# Patient Record
Sex: Female | Born: 1960 | Race: Black or African American | Hispanic: No | Marital: Single | Smoking: Current some day smoker
Health system: Southern US, Community
[De-identification: ages and names within clinical notes are randomized; demographics above are authoritative.]

## PROBLEM LIST (undated history)

## (undated) DIAGNOSIS — F200 Paranoid schizophrenia: Secondary | ICD-10-CM

## (undated) DIAGNOSIS — N185 Chronic kidney disease, stage 5: Secondary | ICD-10-CM

## (undated) DIAGNOSIS — D649 Anemia, unspecified: Secondary | ICD-10-CM

## (undated) DIAGNOSIS — D3502 Benign neoplasm of left adrenal gland: Secondary | ICD-10-CM

## (undated) DIAGNOSIS — I700 Atherosclerosis of aorta: Secondary | ICD-10-CM

## (undated) DIAGNOSIS — E119 Type 2 diabetes mellitus without complications: Secondary | ICD-10-CM

## (undated) DIAGNOSIS — F329 Major depressive disorder, single episode, unspecified: Secondary | ICD-10-CM

## (undated) DIAGNOSIS — R809 Proteinuria, unspecified: Secondary | ICD-10-CM

## (undated) DIAGNOSIS — K5790 Diverticulosis of intestine, part unspecified, without perforation or abscess without bleeding: Secondary | ICD-10-CM

## (undated) DIAGNOSIS — F209 Schizophrenia, unspecified: Secondary | ICD-10-CM

## (undated) DIAGNOSIS — I10 Essential (primary) hypertension: Secondary | ICD-10-CM

## (undated) DIAGNOSIS — F32A Depression, unspecified: Secondary | ICD-10-CM

## (undated) DIAGNOSIS — D3501 Benign neoplasm of right adrenal gland: Secondary | ICD-10-CM

## (undated) HISTORY — DX: Depression, unspecified: F32.A

## (undated) HISTORY — PX: OTHER SURGICAL HISTORY: SHX169

## (undated) HISTORY — DX: Type 2 diabetes mellitus without complications: E11.9

## (undated) HISTORY — DX: Schizophrenia, unspecified: F20.9

## (undated) HISTORY — DX: Major depressive disorder, single episode, unspecified: F32.9

---

## 2004-11-11 ENCOUNTER — Emergency Department: Payer: Self-pay | Admitting: Emergency Medicine

## 2005-09-21 ENCOUNTER — Ambulatory Visit: Payer: Self-pay | Admitting: Gastroenterology

## 2005-10-04 ENCOUNTER — Ambulatory Visit: Payer: Self-pay | Admitting: Gastroenterology

## 2005-11-11 ENCOUNTER — Ambulatory Visit: Payer: Self-pay | Admitting: Gastroenterology

## 2006-05-20 ENCOUNTER — Ambulatory Visit: Payer: Self-pay | Admitting: *Deleted

## 2007-05-19 ENCOUNTER — Ambulatory Visit: Payer: Self-pay | Admitting: *Deleted

## 2007-07-20 ENCOUNTER — Ambulatory Visit: Payer: Self-pay | Admitting: *Deleted

## 2008-08-20 ENCOUNTER — Emergency Department: Payer: Self-pay | Admitting: Emergency Medicine

## 2010-08-01 ENCOUNTER — Emergency Department: Payer: Self-pay | Admitting: Emergency Medicine

## 2010-11-18 ENCOUNTER — Emergency Department: Payer: Self-pay | Admitting: Emergency Medicine

## 2010-11-25 ENCOUNTER — Inpatient Hospital Stay: Payer: Self-pay | Admitting: Internal Medicine

## 2011-02-22 ENCOUNTER — Ambulatory Visit: Payer: Self-pay | Admitting: Family Medicine

## 2012-07-17 DIAGNOSIS — K644 Residual hemorrhoidal skin tags: Secondary | ICD-10-CM | POA: Insufficient documentation

## 2012-08-24 ENCOUNTER — Ambulatory Visit: Payer: Self-pay | Admitting: Emergency Medicine

## 2012-08-29 ENCOUNTER — Ambulatory Visit: Payer: Self-pay | Admitting: Emergency Medicine

## 2012-08-30 LAB — PATHOLOGY REPORT

## 2014-02-18 DIAGNOSIS — R600 Localized edema: Secondary | ICD-10-CM | POA: Insufficient documentation

## 2014-02-20 ENCOUNTER — Ambulatory Visit: Payer: Self-pay | Admitting: Family Medicine

## 2014-02-20 DIAGNOSIS — M109 Gout, unspecified: Secondary | ICD-10-CM | POA: Insufficient documentation

## 2014-03-05 DIAGNOSIS — L237 Allergic contact dermatitis due to plants, except food: Secondary | ICD-10-CM | POA: Insufficient documentation

## 2014-04-23 ENCOUNTER — Emergency Department: Payer: Self-pay | Admitting: Emergency Medicine

## 2014-04-23 LAB — BASIC METABOLIC PANEL
Anion Gap: 7 (ref 7–16)
BUN: 5 mg/dL — AB (ref 7–18)
Calcium, Total: 9.4 mg/dL (ref 8.5–10.1)
Chloride: 105 mmol/L (ref 98–107)
Co2: 30 mmol/L (ref 21–32)
Creatinine: 0.89 mg/dL (ref 0.60–1.30)
EGFR (Non-African Amer.): >60
GLUCOSE: 202 mg/dL — AB (ref 65–99)
Osmolality: 286 (ref 275–301)
Potassium: 3.4 mmol/L — ABNORMAL LOW (ref 3.5–5.1)
Sodium: 142 mmol/L (ref 136–145)

## 2014-04-23 LAB — CBC
HCT: 38.3 % (ref 35.0–47.0)
HGB: 11.9 g/dL — ABNORMAL LOW (ref 12.0–16.0)
MCH: 28.6 pg (ref 26.0–34.0)
MCHC: 31.2 g/dL — ABNORMAL LOW (ref 32.0–36.0)
MCV: 92 fL (ref 80–100)
Platelet: 419 10*3/uL (ref 150–440)
RBC: 4.17 10*6/uL (ref 3.80–5.20)
RDW: 14 % (ref 11.5–14.5)
WBC: 11.7 10*3/uL — AB (ref 3.6–11.0)

## 2014-04-23 LAB — PRO B NATRIURETIC PEPTIDE: B-Type Natriuretic Peptide: 55 pg/mL (ref 0–125)

## 2014-04-23 LAB — TROPONIN I

## 2014-06-09 DIAGNOSIS — F251 Schizoaffective disorder, depressive type: Secondary | ICD-10-CM | POA: Insufficient documentation

## 2014-06-09 DIAGNOSIS — I739 Peripheral vascular disease, unspecified: Secondary | ICD-10-CM | POA: Insufficient documentation

## 2014-06-09 DIAGNOSIS — E1122 Type 2 diabetes mellitus with diabetic chronic kidney disease: Secondary | ICD-10-CM | POA: Insufficient documentation

## 2014-06-09 DIAGNOSIS — E119 Type 2 diabetes mellitus without complications: Secondary | ICD-10-CM | POA: Insufficient documentation

## 2014-07-04 DIAGNOSIS — E782 Mixed hyperlipidemia: Secondary | ICD-10-CM | POA: Insufficient documentation

## 2014-07-29 ENCOUNTER — Ambulatory Visit: Payer: Self-pay | Admitting: Family Medicine

## 2014-09-02 ENCOUNTER — Ambulatory Visit: Payer: Self-pay | Admitting: Family Medicine

## 2014-09-04 DIAGNOSIS — I10 Essential (primary) hypertension: Secondary | ICD-10-CM | POA: Insufficient documentation

## 2014-10-11 DIAGNOSIS — F17200 Nicotine dependence, unspecified, uncomplicated: Secondary | ICD-10-CM | POA: Insufficient documentation

## 2014-11-22 NOTE — Op Note (Signed)
PATIENT NAME:  Alicia Rogers, Alicia Rogers MR#:  [id number] DATE OF BIRTH:  January 19, 1961  DATE OF PROCEDURE:  08/29/2012  PREOPERATIVE DIAGNOSIS: Third-degree hemorrhoid prolapse.   POSTOPERATIVE DIAGNOSIS: Third-degree hemorrhoid prolapse.   PROCEDURE: Hemorrhoidectomy.   DESCRIPTION OF PROCEDURE: The patient was then brought to surgery. Under spinal anesthesia, the area was then prepped and draped. With the patient in a jackknife position, rectal examination was performed. The patient had 2 areas of the hemorrhoids that were prolapsing out and these areas were then grasped with Allis clamps, and the hemorrhoid area was also then clamped with Allis clamps. Next, 1% Xylocaine and Marcaine with epi was then injected in the pedicle. After that, an incision was made on the skin site which was very long and dissection down inside the rectum. The apex was then sutured and after removing the hemorrhoid, the mucous membrane was then sutured together with running sutures and then sutured outside to the skin area. There was another area like that, which was also then done on the right upper quadrant after injecting. Another area around 5 o'clock was also then done. After this, the anal area looked okay and there was no bleeding, and packing with everything was then put in and dressing applied. The patient tolerated the procedure well and was sent to the recovery room in satisfactory condition.   ____________________________ Welford Roche Phylis Bougie, MD [id number] D: 08/29/2012 13:47:06 ET T: 08/29/2012 14:48:41 ET JOB#: [id number]  cc: Ayodele Sangalang S. Phylis Bougie, MD, <Dictator> Lonzo Candy. Chelminski, MD Sharene Butters MD ELECTRONICALLY SIGNED 08/29/2012 15:17

## 2015-03-25 ENCOUNTER — Ambulatory Visit: Payer: Medicaid Other | Admitting: Psychiatry

## 2015-04-22 ENCOUNTER — Encounter: Payer: Self-pay | Admitting: Emergency Medicine

## 2015-04-22 ENCOUNTER — Ambulatory Visit (INDEPENDENT_AMBULATORY_CARE_PROVIDER_SITE_OTHER): Payer: Commercial Managed Care - HMO | Admitting: Psychiatry

## 2015-04-22 ENCOUNTER — Emergency Department
Admission: EM | Admit: 2015-04-22 | Discharge: 2015-04-22 | Discharge status: Against Medical Advice | Payer: Commercial Managed Care - HMO | Attending: Student | Admitting: Student

## 2015-04-22 ENCOUNTER — Encounter: Payer: Self-pay | Admitting: Psychiatry

## 2015-04-22 ENCOUNTER — Emergency Department
Admission: EM | Admit: 2015-04-22 | Discharge: 2015-04-22 | Disposition: A | Discharge status: Home / Self Care | Payer: Commercial Managed Care - HMO | Attending: Emergency Medicine | Admitting: Emergency Medicine

## 2015-04-22 VITALS — BP 108/62 | HR 74 | Temp 96.5°F | Ht 63.0 in | Wt 102.6 lb

## 2015-04-22 DIAGNOSIS — R440 Auditory hallucinations: Secondary | ICD-10-CM | POA: Diagnosis present

## 2015-04-22 DIAGNOSIS — Z720 Tobacco use: Secondary | ICD-10-CM | POA: Insufficient documentation

## 2015-04-22 DIAGNOSIS — R451 Restlessness and agitation: Secondary | ICD-10-CM | POA: Diagnosis present

## 2015-04-22 DIAGNOSIS — E119 Type 2 diabetes mellitus without complications: Secondary | ICD-10-CM | POA: Diagnosis not present

## 2015-04-22 DIAGNOSIS — Z7982 Long term (current) use of aspirin: Secondary | ICD-10-CM | POA: Insufficient documentation

## 2015-04-22 DIAGNOSIS — F251 Schizoaffective disorder, depressive type: Secondary | ICD-10-CM | POA: Diagnosis not present

## 2015-04-22 DIAGNOSIS — I10 Essential (primary) hypertension: Secondary | ICD-10-CM | POA: Diagnosis not present

## 2015-04-22 DIAGNOSIS — Z880 Allergy status to penicillin: Secondary | ICD-10-CM | POA: Diagnosis not present

## 2015-04-22 DIAGNOSIS — Z79899 Other long term (current) drug therapy: Secondary | ICD-10-CM | POA: Diagnosis not present

## 2015-04-22 HISTORY — DX: Essential (primary) hypertension: I10

## 2015-04-22 HISTORY — DX: Type 2 diabetes mellitus without complications: E11.9

## 2015-04-22 LAB — COMPREHENSIVE METABOLIC PANEL
ALBUMIN: 4.2 g/dL (ref 3.5–5.0)
ALT: 14 U/L (ref 14–54)
ANION GAP: 8 (ref 5–15)
AST: 19 U/L (ref 15–41)
Alkaline Phosphatase: 83 U/L (ref 38–126)
BUN: 8 mg/dL (ref 6–20)
CALCIUM: 9.9 mg/dL (ref 8.9–10.3)
CO2: 27 mmol/L (ref 22–32)
Chloride: 106 mmol/L (ref 101–111)
Creatinine, Ser: 0.79 mg/dL (ref 0.44–1.00)
GFR calc non Af Amer: >60 mL/min (ref >60)
GLUCOSE: 139 mg/dL — AB (ref 65–99)
POTASSIUM: 4.6 mmol/L (ref 3.5–5.1)
SODIUM: 141 mmol/L (ref 135–145)
Total Bilirubin: 0.8 mg/dL (ref 0.3–1.2)
Total Protein: 7.9 g/dL (ref 6.5–8.1)

## 2015-04-22 LAB — URINE DRUG SCREEN, QUALITATIVE (ARMC ONLY)
AMPHETAMINES, UR SCREEN: NOT DETECTED
Barbiturates, Ur Screen: NOT DETECTED
Benzodiazepine, Ur Scrn: NOT DETECTED
CANNABINOID 50 NG, UR ~~LOC~~: NOT DETECTED
COCAINE METABOLITE, UR ~~LOC~~: NOT DETECTED
MDMA (ECSTASY) UR SCREEN: NOT DETECTED
Methadone Scn, Ur: NOT DETECTED
Opiate, Ur Screen: NOT DETECTED
Phencyclidine (PCP) Ur S: NOT DETECTED
TRICYCLIC, UR SCREEN: NOT DETECTED

## 2015-04-22 LAB — CBC
HEMATOCRIT: 38.2 % (ref 35.0–47.0)
HEMOGLOBIN: 12.1 g/dL (ref 12.0–16.0)
MCH: 28.4 pg (ref 26.0–34.0)
MCHC: 31.8 g/dL — AB (ref 32.0–36.0)
MCV: 89.3 fL (ref 80.0–100.0)
Platelets: 310 10*3/uL (ref 150–440)
RBC: 4.28 MIL/uL (ref 3.80–5.20)
RDW: 14.8 % — ABNORMAL HIGH (ref 11.5–14.5)
WBC: 5.4 10*3/uL (ref 3.6–11.0)

## 2015-04-22 LAB — ETHANOL

## 2015-04-22 NOTE — Progress Notes (Signed)
Psychiatric Initial Adult Assessment   Patient Identification: Alicia Rogers MRN:  [id number] Date of Evaluation:  04/22/2015 Referral Source: Dr. Bary Leriche Chief Complaint:  "Well I hadn't seen Dr. Mamie Nick in a while." Chief Complaint    Medication Refill; Follow-up; Depression     Visit Diagnosis: No diagnosis found. Diagnosis:   Patient Active Problem List   Diagnosis Date Noted  . Compulsive tobacco user syndrome [F17.200] 10/11/2014  . BP (high blood pressure) [I10] 09/04/2014  . 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 Claudication [I73.9] 06/09/2014  . Schizoaffective disorder, depressive type [F25.1] 06/09/2014  . Diabetes mellitus, type 2 [E11.9] 06/09/2014   History of Present Illness:  Patient's mother Lyda Jester was present with patient's consent during the appointment. Patient indicates that she initially began getting treatment for mental health in 2002 "February" when she was hearing voices. States at that time she would hear voices that would protect her as well as voices that were trying to hurt her. She states that she spent 2 weeks at [facility]. She states then she follow with an outpatient psychiatrist Dr. Dorena Dew for 3 months and then she stated her next outpatient treater was Dr. Mamie Nick. She states she seen Dr. Mamie Nick for about 5 or 6 years. When I asked her to explain who she had seen in between 2002 and Dr. Mamie Nick she was not entirely clear.  Patient indicates that she will last saw Dr. Mamie Nick in April and was given some samples of Latuda but she has run out of them and then off of the medications for several months.  Some notes from Dr. Mamie Nick arrived after the appointment was over. However these notes indicate that the patient has been maintained on Latuda since 2013 and that the patient responds well to the medication but still experiences auditory hallucinations and periodic depressive symptoms. It was noted that the patient has issues with insomnia  and response to trazodone and Ambien. The note lists diagnoses of schizoaffective disorder depressed type. The interim summary dated 01/06/2015 was medications as Latuda 120 mg and Ambien 10 mg for sleep.  Mrs. Maricela Bo, the patient's mother did assert that a lot of the patient's problems come from when she visits her father and stepmother's home. Mrs. Maricela Bo states that she believes the stepmother is putting voodoo/spells on the patient.  Patient's sister also presented prior to the appointment and informed writer that her sister was not eating and somewhat preoccupied with the hallucinations. Elements:  Duration:  as noted above. Associated Signs/Symptoms: Depression Symptoms:  depressed mood, anhedonia, insomnia, decreased appetite, (Hypo) Manic Symptoms:  Denies presently Anxiety Symptoms:  Denies presently Psychotic Symptoms:  Hallucinations: Auditory she states that her voices now will tell her that others want her dead and tell her not to eat. Patient denies any suicidal ideation in the past or presently. PTSD Symptoms: NA  Past Medical History:  Past Medical History  Diagnosis Date  . Diabetes mellitus without complication   . Hypertension    No past surgical history on file. Family History: No family history on file. Social History:   Social History   Social History  . Marital Status: Single    Spouse Name: N/A  . Number of Children: N/A  . Years of Education: N/A   Social History Main Topics  . Smoking status: Current Every Day Smoker -- 0.50 packs/day    Types: Cigarettes    Start date: 04/21/1978  . Smokeless tobacco: Never Used  . Alcohol Use:  No  . Drug Use: None  . Sexual Activity: Not Asked   Other Topics Concern  . None   Social History Narrative   Additional Social History: Patient describes her childhood as "perfect." She denies any abuse. She states she has 6 siblings. Her mother and father are divorced. Her father has  remarried.  Musculoskeletal: Strength & Muscle Tone: within normal limits Gait & Station: normal Patient leans: N/A  Psychiatric Specialty Exam: HPI  Review of Systems  Psychiatric/Behavioral: Positive for hallucinations. Negative for depression, suicidal ideas, memory loss and substance abuse. The patient has insomnia. The patient is not nervous/anxious.     Blood pressure 108/62, pulse 74, temperature 96.5 F (35.8 C), temperature source Tympanic, height '5\' 3"'  (1.6 m), weight 102 lb 9.6 oz (46.539 kg), SpO2 94 %.Body mass index is 18.18 kg/(m^2).  General Appearance: Fairly Groomed  Eye Contact:  Good  Speech:  Slow  Volume:  Normal  Mood:  Okay  Affect:  Constricted  Thought Process:  Concrete and slightly impoverished  Orientation:  Full (Time, Place, and Person)  Thought Content:  Hallucinations: Auditory  Suicidal Thoughts:  No  Homicidal Thoughts:  No  Memory:  Immediate;   Good Recent;   Fair Remote;   Fair  Judgement:  Impaired  Insight:  Fair  Psychomotor Activity:  Negative  Concentration:  Fair  Recall:  [employer] of Knowledge:Fair  Language: Good  Akathisia:  Negative  Handed:  Right unknown   AIMS (if indicated):    Assets:  Social Support  ADL's:  Intact  Cognition: WNL  Sleep:  poor   Is the patient at risk to self?  No. Has the patient been a risk to self in the past 6 months?  No. Has the patient been a risk to self within the distant past?  No. Is the patient a risk to others?  No. Has the patient been a risk to others in the past 6 months?  No. Has the patient been a risk to others within the distant past?  No.  Allergies:   Allergies  Allergen Reactions  . Penicillins     Rash   Current Medications: Current Outpatient Prescriptions  Medication Sig Dispense Refill  . ACCU-CHEK FASTCLIX LANCETS MISC     . ACCU-CHEK SMARTVIEW test strip     . aspirin EC 81 MG tablet Take 81 mg by mouth.    Marland Kitchen atorvastatin (LIPITOR) 40 MG tablet Take by  mouth.    . Blood Glucose Monitoring Suppl (FIFTY50 GLUCOSE METER 2.0) W/DEVICE KIT Check blood sugar 3 times a day. Dx E11.9    . calcium citrate-vitamin D (CITRACAL+D) 315-200 MG-UNIT per tablet Take by mouth.    Marland Kitchen glipiZIDE (GLUCOTROL XL) 5 MG 24 hr tablet Take 5 mg by mouth.    Elmore Guise Devices (RELION LANCING DEVICE) MISC     . losartan-hydrochlorothiazide (HYZAAR) 50-12.5 MG per tablet Take by mouth.    . metFORMIN (GLUCOPHAGE) 1000 MG tablet Take by mouth.    . nicotine polacrilex (COMMIT) 4 MG lozenge     . potassium chloride (KLOR-CON 10) 10 MEQ tablet Take by mouth.    . traZODone (DESYREL) 150 MG tablet     . Vitamin D, Ergocalciferol, (DRISDOL) 50000 UNITS CAPS capsule Take by mouth.     No current facility-administered medications for this visit.    Previous Psychotropic Medications: Yes   Substance Abuse History in the last 12 months:  No.  Consequences of Substance  Abuse: NA  Medical Decision Making:  Review of Medication Regimen & Side Effects (2) and Review of New Medication or Change in Dosage (2)  Treatment Plan Summary: Medication management and Plan We will restart the patient back on Latuda. I'll restart her back on 40 mg a day since she's been off for several months. Risk and benefits of been discussed patient is able to consent. I did provide patient with samples. Patient will follow up in 2 weeks. In regards to risk assessment the patient denies any past suicide attempts. She has protective factors of race, gender, no past suicide attempts and good social supports. At this time low risk of imminent harm to self or others.   Schizoaffective disorder, depressed type. Restart Latuda as noted above.  It also appears that housing and disposition may be issues for patient. She is currently spending time amongst relatives however patient verbalizes some interest in perhaps being in a group home or some type of supervised or assisted living setting. I have referred to  social work to make assessment and perhaps help with any referrals in this area.   Faith Rogue 9/20/201612:56 PM

## 2015-04-22 NOTE — Discharge Instructions (Signed)
You have been seen in the Emergency Department (ED) today for a psychiatric complaint.  You have been evaluated by psychiatry and we believe you are safe to be discharged from the hospital.     Please return to the ED immediately if you have ANY thoughts of hurting yourself or anyone else, so that we may help you.  Please avoid alcohol and drug use.  BE SURE AND TAKE YOUR LATUDA.  Follow up with your doctor and/or therapist as soon as possible regarding today's ED visit.   Please follow up any other recommendations and clinic appointments provided by the psychiatry team that saw you in the Emergency Department.   Schizoaffective Disorder Schizoaffective disorder (ScAD) is a mental illness. It causes symptoms that are a mixture of schizophrenia (a psychotic disorder) and an affective (mood) disorder. The schizophrenic symptoms may include delusions, hallucinations, or odd behavior. The mood symptoms may be similar to major depression or bipolar disorder. ScAD may interfere with personal relationships or normal daily activities. People with ScAD are at increased risk for job loss, social isolation,physical health problems, anxiety and substance use disorders, and suicide. ScAD usually occurs in cycles. Periods of severe symptoms are followed by periods of less severe symptoms or improvement. The illness affects men and women equally but usually appears at an earlier age (teenage or early adult years) in men. People who have family members with schizophrenia, bipolar disorder, or ScAD are at higher risk of developing ScAD. SYMPTOMS  At any one time, people with ScAD may have psychotic symptoms only or both psychotic and mood symptoms. The psychotic symptoms include one or more of the following:  Hearing, seeing, or feeling things that are not there (hallucinations).   Having fixed, false beliefs (delusions). The delusions usually are of being attacked, harassed, cheated, persecuted, or conspired  against (paranoid delusions).  Speaking in a way that makes no sense to others (disorganized speech). The psychotic symptoms of ScAD may also include confusing or odd behavior or any of the negative symptoms of schizophrenia. These include loss of motivation for normal daily activities, such as bathing or grooming, withdrawal from other people, and lack of emotions.  The mood symptoms of ScAD occur more often than not. They resemble major depressive disorder or bipolar mania. Symptoms of major depression include depressed mood and four or more of the following:  Loss of interest in usually pleasurable activities (anhedonia).  Sleeping more or less than normal.  Feeling worthless or excessively guilty.  Lack of energy or motivation.  Trouble concentrating.  Eating more or less than usual.  Thinking a lot about death or suicide. Symptoms of bipolar mania include abnormally elevated or irritable mood and increased energy or activity, plus three or more of the following:   More confidence than normal or feeling that you are able to do anything (grandiosity).  Feeling rested with less sleep than normal.   Being easily distracted.   Talking more than usual or feeling pressured to keep talking.   Feeling that your thoughts are racing.  Engaging in high-risk activities such as buying sprees or foolish business decisions. DIAGNOSIS  ScAD is diagnosed through an assessment by your health care provider. Your health care provider will observe and ask questions about your thoughts, behavior, mood, and ability to function in daily life. Your health care provider may also ask questions about your medical history and use of drugs, including prescription medicines. Your health care provider may also order blood tests and imaging exams. Certain  medical conditions and substances can cause symptoms that resemble ScAD. Your health care provider may refer you to a mental health specialist for  evaluation.  ScAD is divided into two types. The depressive type is diagnosed if your mood symptoms are limited to major depression. The bipolar type is diagnosed if your mood symptoms are manic or a mixture of manic and depressive symptoms TREATMENT  ScAD is usually a lifelong illness. Long-term treatment is necessary. The following treatments are available:  Medicine. Different types of medicine are used to treat ScAD. The exact combination depends on the type and severity of your symptoms. Antipsychotic medicine is used to control psychotic symptomssuch as delusions, paranoia, and hallucinations. Mood stabilizers can even the highs and lows of bipolar manic mood swings. Antidepressant medicines are used to treat major depressive symptoms.  Counseling or talk therapy. Individual, group, or family counseling may be helpful in providing education, support, and guidance. Many people with ScAD also benefit from social skills and job skills (vocational) training. A combination of medicine and counseling is usually best for managing the disorder over time. A procedure in which electricity is applied to the brain through the scalp (electroconvulsive therapy) may be used to treat people with severe manic symptoms that do not respond to medicine and counseling. HOME CARE INSTRUCTIONS   Take all your medicine as prescribed.  Check with your health care provider before starting new prescription or over-the-counter medicines.  Keep all follow up appointments with your health care provider. SEEK MEDICAL CARE IF:   If you are not able to take your medicines as prescribed.  If your symptoms get worse. SEEK IMMEDIATE MEDICAL CARE IF:   You have serious thoughts about hurting yourself or others. Document Released: 11/29/2006 Document Revised: 12/03/2013 Document Reviewed: 03/02/2013 [facility] Patient Information 2015 [place], [place]. This information is not intended to replace advice given to you by your  health care provider. Make sure you discuss any questions you have with your health care provider.

## 2015-04-22 NOTE — ED Notes (Signed)
Says she has schizophrenia and it is getting worse.  S.ays she does hear voices and they tell her not to eat or sleep.

## 2015-04-22 NOTE — ED Provider Notes (Signed)
[name] Emergency Department [name] Note  ____________________________________________  Time seen: Approximately 3:11 PM  I have reviewed the triage vital signs and the nursing notes.   HISTORY  Chief Complaint Hallucinations    HPI Alicia Rogers is a 54 y.o. female with a history of schizoaffective disorder, depressed type, who presents to the emergency department complaining of hearing auditory hallucinations.she presented today with family at the office of Dr. Faith Rogue, psychiatrist here at [facility].  Please refer to his note which extensively documents there conversation.  In short, the patient has history as described above and she has not been eating or drinking well, has been having auditory hallucinations, and has not been taking any medications since the Spring or perhaps early summer which she ran out of [place].  The patient is also having some social issues in terms of a place to stay and she is currently staying with a female relative.Dr. Jimmye Norman was cognizant of the potential for some social/disposition issues, but they had a good plan in place and he restarted her on Latuda and plans to follow up with her in clinic.  Apparently the patient left from his clinic and came to the emergency department and checked in.  She stated that she was sent from the doctor's office, but I spoke by phone with Dr. Jimmye Norman of verify that this is not the case.  I asked the patient how I can help, and she states that she is not comfortable being with her family member and thinks that it would be better if she stated "somewhere else".  She denies any acute medical problems or complaints, admits to chronic auditory hallucinations, denies command hallucinations, and denies SI/HI.    Past Medical History  Diagnosis Date  . Diabetes mellitus without complication   . Hypertension     Patient Active Problem List   Diagnosis Date  Noted  . Compulsive tobacco user syndrome 10/11/2014  . BP (high blood pressure) 09/04/2014  . 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  . Claudication 06/09/2014  . Schizoaffective disorder, depressive type 06/09/2014  . Diabetes mellitus, type 2 06/09/2014    History reviewed. No pertinent past surgical history.  Current Outpatient Rx  Name  Route  Sig  Dispense  Refill  . ACCU-CHEK FASTCLIX LANCETS MISC               . ACCU-CHEK SMARTVIEW test strip                 Dispense as written.   Marland Kitchen aspirin EC 81 MG tablet   Oral   Take 81 mg by mouth.         Marland Kitchen atorvastatin (LIPITOR) 40 MG tablet   Oral   Take by mouth.         . Blood Glucose Monitoring Suppl (FIFTY50 GLUCOSE METER 2.0) W/DEVICE KIT      Check blood sugar 3 times a day. Dx E11.9         . calcium citrate-vitamin D (CITRACAL+D) 315-200 MG-UNIT per tablet   Oral   Take by mouth.         Marland Kitchen glipiZIDE (GLUCOTROL XL) 5 MG 24 hr tablet   Oral   Take 5 mg by mouth.         Elmore Guise Devices (RELION LANCING DEVICE) MISC               . losartan-hydrochlorothiazide (HYZAAR) 50-12.5 MG per tablet  Oral   Take by mouth.         . metFORMIN (GLUCOPHAGE) 1000 MG tablet   Oral   Take by mouth.         . nicotine polacrilex (COMMIT) 4 MG lozenge               . potassium chloride (KLOR-CON 10) 10 MEQ tablet   Oral   Take by mouth.         . traZODone (DESYREL) 150 MG tablet               . Vitamin D, Ergocalciferol, (DRISDOL) 50000 UNITS CAPS capsule   Oral   Take by mouth.           Allergies Penicillins  History reviewed. No pertinent family history.  Social History Social History  Substance Use Topics  . Smoking status: Current Every Day Smoker -- 0.50 packs/day    Types: Cigarettes    Start date: 04/21/1978  . Smokeless tobacco: Never Used  . Alcohol Use: No    Review of Systems Constitutional: No fever/chills Eyes: No visual  changes. ENT: No sore throat. Cardiovascular: Denies chest pain. Respiratory: Denies shortness of breath. Gastrointestinal: No abdominal pain.  No nausea, no vomiting.  No diarrhea.  No constipation. Genitourinary: Negative for dysuria. Musculoskeletal: Negative for back pain. Skin: Negative for rash. Neurological: Negative for headaches, focal weakness or numbness. Psychiatric:  Admits to chronic auditory hallucinations but denies command hallucinations and denies SI/HI  10-point ROS otherwise negative.  ____________________________________________   PHYSICAL EXAM:  VITAL SIGNS: ED Triage Vitals  Enc Vitals Group     BP 04/22/15 1319 140/72 mmHg     Pulse Rate 04/22/15 1319 72     Resp 04/22/15 1319 20     Temp 04/22/15 1319 98.4 F (36.9 C)     Temp Source 04/22/15 1319 Oral     SpO2 04/22/15 1319 100 %     Weight 04/22/15 1319 102 lb (46.267 kg)     Height --      Head Cir --      Peak Flow --      Pain Score 04/22/15 1319 0     Pain Loc --      Pain Edu? --      Excl. in [place]? --     Constitutional: Alert and oriented. Well appearing and in no acute distress.sitting calmly in bed. Eyes: Conjunctivae are normal. PERRL. EOMI. Head: Atraumatic. Nose: No congestion/rhinnorhea. Mouth/Throat: Mucous membranes are moist.  Oropharynx non-erythematous. Neck: No stridor.   Cardiovascular: Normal rate, regular rhythm. Grossly normal heart sounds.  Good peripheral circulation. Respiratory: Normal respiratory effort.  No retractions. Lungs CTAB. Gastrointestinal: Soft and nontender. No distention. No abdominal bruits. No CVA tenderness. Musculoskeletal: No lower extremity tenderness nor edema.  No joint effusions. Neurologic:  Normal speech and language. No gross focal neurologic deficits are appreciated.  Skin:  Skin is warm, dry and intact. No rash noted. Psychiatric: Mood and affect are normal. Speech and behavior are normal.admits to chronic auditory hallucinations, denies  SI/HI, and states that she is not comfortable with her current living situation.  When asked about her visit today she eventually admitted that she does have a plan for outpatient follow-up but is not sure she likes it.  ____________________________________________   LABS (all labs ordered are listed, but only abnormal results are displayed)  Labs Reviewed - No data to display ____________________________________________  EKG  Not indicated ____________________________________________  RADIOLOGY  No results found.  ____________________________________________   PROCEDURES  Procedure(s) performed: None  Critical Care performed: No ____________________________________________   INITIAL IMPRESSION / ASSESSMENT AND PLAN / ED COURSE  Pertinent labs & imaging results that were available during my care of the patient were reviewed by me and considered in my medical decision making (see chart for details).  The patient has an outpatient psychiatrist, was just started back on medication, and has plans for follow-up.  It seems that she is here because she is not pleased with her current living situation.  I do not believe that this is likely to result in an inpatient hospitalization, but I spoke with Dr. Weber Cooks in person about this patient and he will consult and offer his evaluation and plan as well.  I do not believe she is a danger to herself or others at this time.  ----------------------------------------- 7:19 PM on 04/22/2015 -----------------------------------------   BP 140/72 mmHg  Pulse 72  Temp(Src) 98.4 F (36.9 C) (Oral)  Resp 20  Wt 102 lb (46.267 kg)  SpO2 100%  I spoke in person with Dr. Weber Cooks who evaluated the patient and feels that the patient is safe to be discharged with outpatient follow-up.  The patient is hemodynamically stable and appropriate to go at this time.   ____________________________________________  FINAL CLINICAL IMPRESSION(S) / ED  DIAGNOSES  Final diagnoses:  Schizoaffective disorder, depressive type      NEW MEDICATIONS STARTED DURING THIS VISIT:  New Prescriptions   No medications on file     Hinda Kehr, MD 04/22/15 1919

## 2015-04-22 NOTE — ED Notes (Signed)
Patient was previously checked in to be seen, left to smoke cigarette and check on her mother. States she wants to be checked back in for hearing voices/Schizophrenia, agrees to stay the full time.

## 2015-04-22 NOTE — ED Notes (Signed)
POCT preg neg.

## 2015-04-22 NOTE — ED Notes (Signed)
ENVIRONMENTAL ASSESSMENT Potentially harmful objects out of patient reach: Yes.   Personal belongings secured: Yes.   Patient dressed in hospital provided attire only: Yes.   Plastic bags out of patient reach: Yes.   Patient care equipment (cords, cables, call bells, lines, and drains) shortened, removed, or accounted for: Yes.   Equipment and supplies removed from bottom of stretcher: Yes.   Potentially toxic materials out of patient reach: Yes.   Sharps container removed or out of patient reach: Yes.     BEHAVIORAL HEALTH ROUNDING Patient sleeping: No. Patient alert and oriented: yes Behavior appropriate: Yes.  ; If no, describe:  Nutrition and fluids offered: Yes  Toileting and hygiene offered: Yes  Sitter present: no Law enforcement present: Yes

## 2015-04-22 NOTE — ED Notes (Signed)
BEHAVIORAL HEALTH ROUNDING Patient sleeping: Yes.   Patient alert and oriented: no Behavior appropriate: Yes.  ; If no, describe:  Nutrition and fluids offered: No Toileting and hygiene offered: No Sitter present: no Law enforcement present: Yes

## 2015-04-22 NOTE — ED Notes (Signed)
Pt to ed with c/o hearing voices, she was sent from [place] office for not taking her meds recently.  Pt family also reports she is not eating or drinking at home.  Pt with flat affect.

## 2015-04-22 NOTE — ED Notes (Signed)
BEHAVIORAL HEALTH ROUNDING Patient sleeping: No. Patient alert and oriented: yes Behavior appropriate: Yes.  ; If no, describe:  Nutrition and fluids offered: Yes  Toileting and hygiene offered: Yes  Sitter present: no Law enforcement present: Yes  

## 2015-04-22 NOTE — Consult Note (Signed)
[facility] Psychiatry Consult   Reason for Consult:  Consult for this 54 year old woman with a history of schizophrenia who came into the emergency room voluntarily Referring Physician:  Karma Greaser Patient Identification: Alicia Rogers MRN:  [id number] Principal Diagnosis: Schizoaffective disorder, depressive type Diagnosis:   Patient Active Problem List   Diagnosis Date Noted  . Compulsive tobacco user syndrome [F17.200] 10/11/2014  . BP (high blood pressure) [I10] 09/04/2014  . 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 Claudication [I73.9] 06/09/2014  . Schizoaffective disorder, depressive type [F25.1] 06/09/2014  . Diabetes mellitus, type 2 [E11.9] 06/09/2014    Total Time spent with patient: 45 minutes  Subjective:   Alicia Rogers is a 54 y.o. female patient admitted with "I thought maybe I could get a new place to live".  HPI:  Information from the patient and the chart. This 54 year old woman with history of schizophrenia came to the emergency room voluntarily at her own insistence. She had just been to see her psychiatrist today and he had given her some samples of medication but had not felt she needed hospitalization. Patient tells me that she hears voices and sees things. She has a hard time describing exactly what they are. She is rather vague about her symptoms. Her mood feels okay. Sleep is okay. Denies any suicidal or homicidal ideation. Used to beers yesterday but doesn't usually drink and doesn't use any other drugs. She had been off her medicine for about a month by her estimation but went to see her new psychiatrist today and was prescribed Latuda, which had previously been effective for her. Patient tells me that she came to the emergency room because she was tired of living with her sister and thought that maybe she could get a new place to stay. Denied any suicidal or homicidal thoughts.  Past psychiatric history: Long history  of schizophrenia or schizoaffective disorder. Multiple medicines have been tried in the past. Has been stable for quite a while on her current anti-psychotic. It's well-documented that she continues to complain of hallucinations even with the best medication management. Denies any past history of suicide attempts or violence. He sent hospitalizations.  Social history: Patient lives with her sister. She's been there for about a year. She does not describe any abuse or inappropriate situations.  Medical history: Diabetes and high blood pressure.  Family history: Nonidentified  Substance abuse history: Drinks occasionally not to excess no other drug abuse.   HPI Elements:   Quality:  Auditory hallucinations. Severity:  Moderate and chronic. Timing:  Ongoing. Duration:  Chronic situation. Context:  Recently restarted medicine.  Past Medical History:  Past Medical History  Diagnosis Date  . Diabetes mellitus without complication   . Hypertension    History reviewed. No pertinent past surgical history. Family History: History reviewed. No pertinent family history. Social History:  History  Alcohol Use No     History  Drug Use Not on file    Social History   Social History  . Marital Status: Single    Spouse Name: N/A  . Number of Children: N/A  . Years of Education: N/A   Social History Main Topics  . Smoking status: Current Every Day Smoker -- 0.50 packs/day    Types: Cigarettes    Start date: 04/21/1978  . Smokeless tobacco: Never Used  . Alcohol Use: No  . Drug Use: None  . Sexual Activity: Not Asked   Other Topics Concern  . None   Social  History Narrative   Additional Social History:                          Allergies:   Allergies  Allergen Reactions  . Penicillins     Rash    Labs:  Results for orders placed or performed during the hospital encounter of 04/22/15 (from the past 48 hour(s))  Comprehensive metabolic panel     Status: Abnormal    Collection Time: 04/22/15 11:44 AM  Result Value Ref Range   Sodium 141 135 - 145 mmol/L   Potassium 4.6 3.5 - 5.1 mmol/L   Chloride 106 101 - 111 mmol/L   CO2 27 22 - 32 mmol/L   Glucose, Bld 139 (H) 65 - 99 mg/dL   BUN 8 6 - 20 mg/dL   Creatinine, Ser 0.79 0.44 - 1.00 mg/dL   Calcium 9.9 8.9 - 10.3 mg/dL   Total Protein 7.9 6.5 - 8.1 g/dL   Albumin 4.2 3.5 - 5.0 g/dL   AST 19 15 - 41 U/L   ALT 14 14 - 54 U/L   Alkaline Phosphatase 83 38 - 126 U/L   Total Bilirubin 0.8 0.3 - 1.2 mg/dL   GFR calc non Af Amer >60 >60 mL/min   GFR calc Af Amer >60 >60 mL/min    Comment: (NOTE) The eGFR has been calculated using the CKD EPI equation. This calculation has not been validated in all clinical situations. eGFR's persistently <60 mL/min signify possible Chronic Kidney Disease.    Anion gap 8 5 - 15  CBC     Status: Abnormal   Collection Time: 04/22/15 11:44 AM  Result Value Ref Range   WBC 5.4 3.6 - 11.0 K/uL   RBC 4.28 3.80 - 5.20 MIL/uL   Hemoglobin 12.1 12.0 - 16.0 g/dL   HCT 38.2 35.0 - 47.0 %   MCV 89.3 80.0 - 100.0 fL   MCH 28.4 26.0 - 34.0 pg   MCHC 31.8 (L) 32.0 - 36.0 g/dL   RDW 14.8 (H) 11.5 - 14.5 %   Platelets 310 150 - 440 K/uL  Ethanol (ETOH)     Status: None   Collection Time: 04/22/15 11:44 AM  Result Value Ref Range   Alcohol, Ethyl (B) <5 <5 mg/dL    Comment:        LOWEST DETECTABLE LIMIT FOR SERUM ALCOHOL IS 5 mg/dL FOR MEDICAL PURPOSES ONLY   Urine Drug Screen, Qualitative ([facility] only)     Status: None   Collection Time: 04/22/15 11:44 AM  Result Value Ref Range   Tricyclic, Ur Screen NONE DETECTED NONE DETECTED   Amphetamines, Ur Screen NONE DETECTED NONE DETECTED   MDMA (Ecstasy)Ur Screen NONE DETECTED NONE DETECTED   Cocaine Metabolite,Ur [place] NONE DETECTED NONE DETECTED   Opiate, Ur Screen NONE DETECTED NONE DETECTED   Phencyclidine (PCP) Ur S NONE DETECTED NONE DETECTED   Cannabinoid 50 Ng, Ur [place] NONE DETECTED NONE DETECTED   Barbiturates, Ur  Screen NONE DETECTED NONE DETECTED   Benzodiazepine, Ur Scrn NONE DETECTED NONE DETECTED   Methadone Scn, Ur NONE DETECTED NONE DETECTED    Comment: (NOTE) 309  Tricyclics, urine               Cutoff 1000 ng/mL 200  Amphetamines, urine             Cutoff 1000 ng/mL 300  MDMA (Ecstasy), urine           Cutoff  500 ng/mL 400  Cocaine Metabolite, urine       Cutoff 300 ng/mL 500  Opiate, urine                   Cutoff 300 ng/mL 600  Phencyclidine (PCP), urine      Cutoff 25 ng/mL 700  Cannabinoid, urine              Cutoff 50 ng/mL 800  Barbiturates, urine             Cutoff 200 ng/mL 900  Benzodiazepine, urine           Cutoff 200 ng/mL 1000 Methadone, urine                Cutoff 300 ng/mL 1100 1200 The urine drug screen provides only a preliminary, unconfirmed 1300 analytical test result and should not be used for non-medical 1400 purposes. Clinical consideration and professional judgment should 1500 be applied to any positive drug screen result due to possible 1600 interfering substances. A more specific alternate chemical method 1700 must be used in order to obtain a confirmed analytical result.  1800 Gas chromato graphy / mass spectrometry (GC/MS) is the preferred 1900 confirmatory method.     Vitals: Blood pressure 140/72, pulse 72, temperature 98.4 F (36.9 C), temperature source Oral, resp. rate 20, weight 46.267 kg (102 lb), SpO2 100 %.  Risk to Self: Is patient at risk for suicide?: No Risk to Others:   Prior Inpatient Therapy:   Prior Outpatient Therapy:    No current facility-administered medications for this encounter.   Current Outpatient Prescriptions  Medication Sig Dispense Refill  . ACCU-CHEK FASTCLIX LANCETS MISC     . ACCU-CHEK SMARTVIEW test strip     . aspirin EC 81 MG tablet Take 81 mg by mouth.    Marland Kitchen atorvastatin (LIPITOR) 40 MG tablet Take by mouth.    . Blood Glucose Monitoring Suppl (FIFTY50 GLUCOSE METER 2.0) W/DEVICE KIT Check blood sugar 3 times a  day. Dx E11.9    . calcium citrate-vitamin D (CITRACAL+D) 315-200 MG-UNIT per tablet Take by mouth.    Marland Kitchen glipiZIDE (GLUCOTROL XL) 5 MG 24 hr tablet Take 5 mg by mouth.    Elmore Guise Devices (RELION LANCING DEVICE) MISC     . losartan-hydrochlorothiazide (HYZAAR) 50-12.5 MG per tablet Take by mouth.    . metFORMIN (GLUCOPHAGE) 1000 MG tablet Take by mouth.    . nicotine polacrilex (COMMIT) 4 MG lozenge     . potassium chloride (KLOR-CON 10) 10 MEQ tablet Take by mouth.    . traZODone (DESYREL) 150 MG tablet     . Vitamin D, Ergocalciferol, (DRISDOL) 50000 UNITS CAPS capsule Take by mouth.      Musculoskeletal: Strength & Muscle Tone: within normal limits Gait & Station: normal Patient leans: N/A  Psychiatric Specialty Exam: Physical Exam  Nursing note and vitals reviewed. Constitutional: She appears well-developed and well-nourished.  HENT:  Head: Normocephalic and atraumatic.  Eyes: Conjunctivae are normal. Pupils are equal, round, and reactive to light.  Neck: Normal range of motion.  Cardiovascular: Normal heart sounds.   Respiratory: Effort normal.  GI: Soft.  Musculoskeletal: Normal range of motion.  Neurological: She is alert.  Skin: Skin is warm and dry.  Psychiatric: Judgment and thought content normal. Her affect is blunt. Her speech is delayed. She is slowed. Cognition and memory are normal.    Review of Systems  Constitutional: Negative.   HENT: Negative.   Eyes:  Negative.   Respiratory: Negative.   Cardiovascular: Negative.   Gastrointestinal: Negative.   Musculoskeletal: Negative.   Skin: Negative.   Neurological: Negative.   Psychiatric/Behavioral: Positive for hallucinations. Negative for depression, suicidal ideas, memory loss and substance abuse. The patient is not nervous/anxious and does not have insomnia.     Blood pressure 140/72, pulse 72, temperature 98.4 F (36.9 C), temperature source Oral, resp. rate 20, weight 46.267 kg (102 lb), SpO2 100 %.Body  mass index is 18.65 kg/(m^2).  General Appearance: Casual  Eye Contact::  Fair  Speech:  Slow  Volume:  Normal  Mood:  Euthymic  Affect:  Flat  Thought Process:  Tangential  Orientation:  Full (Time, Place, and Person)  Thought Content:  Hallucinations: Auditory  Suicidal Thoughts:  No  Homicidal Thoughts:  No  Memory:  Immediate;   Fair Recent;   Fair Remote;   Fair  Judgement:  Impaired  Insight:  Shallow  Psychomotor Activity:  Decreased  Concentration:  Fair  Recall:  [place]  Language: Fair  Akathisia:  No  Handed:  Right  AIMS (if indicated):     Assets:  Desire for Improvement Housing Resilience Social Support  ADL's:  Intact  Cognition: Impaired,  Mild  Sleep:      Medical Decision Making: Established Problem, Stable/Improving (1), Review of Psycho-Social Stressors (1), Review or order clinical lab tests (1), Discuss test with performing physician (1) and Review of Medication Regimen & Side Effects (2)  Treatment Plan Summary: Plan Patient was schizophrenia who is at her baseline in terms of mental state. No indication of any acute dangerousness. No indication of suicidal or homicidal ideation. Patient came here after seeing her new psychiatrist today. He was surprised that she had come to the emergency room as he clearly did not feel she needed to be hospitalized. Patient does not meet commitment criteria and does not require inpatient treatment. She is advised to continue medication management as prescribed and follow-up with the plan from her new outpatient psychiatrist. She will be discharged back to her sister's house. Patient agrees with the treatment plan. Case reviewed with emergency room doctor.  Plan:  Patient does not meet criteria for psychiatric inpatient admission. Discussed crisis plan, support from social network, calling 911, coming to the Emergency Department, and calling Suicide Hotline. Disposition: Discharged from ER at  discretion of ER doctor  Alethia Berthold 04/22/2015 7:37 PM

## 2015-04-23 ENCOUNTER — Telehealth: Payer: Self-pay | Admitting: Psychiatry

## 2015-04-24 ENCOUNTER — Ambulatory Visit (INDEPENDENT_AMBULATORY_CARE_PROVIDER_SITE_OTHER): Payer: Commercial Managed Care - HMO | Admitting: Licensed Clinical Social Worker

## 2015-04-24 ENCOUNTER — Ambulatory Visit: Payer: Self-pay | Admitting: Licensed Clinical Social Worker

## 2015-04-24 DIAGNOSIS — F251 Schizoaffective disorder, depressive type: Secondary | ICD-10-CM

## 2015-04-24 NOTE — Telephone Encounter (Signed)
Writer attempted to return patient's phone call in regards to the social worker's questions however as discussed above and the foam went to a voicemail and indicated the mailbox was full and could not take additional messages. AW

## 2015-04-24 NOTE — Progress Notes (Signed)
Patient:   Alicia Rogers   DOB:   Oct 21, 1960  MR Number:  [id number]  Location:  [facility] [address] [place] [address] Dept: [phone]           Date of Service:   04/24/2015  Start Time:   10a End Time:   11a  Provider/Observer:  Lubertha South Counselor       Billing Code/Service: [id number]  Behavioral Observation: Alicia Rogers  presents as a 54 y.o.-year-old African American Female who appeared her stated age. her dress was Appropriate and she was Casual and her manners were Appropriate to the situation.  There were not any physical disabilities noted.  she displayed an appropriate level of cooperation and motivation.    Interactions:    Active   Attention:   within normal limits  Memory:   within normal limits  Speech (Volume):  normal  Speech:   normal volume  Thought Process:  Disorganized  Though Content:  WNL  Orientation:   person, place, time/date and situation  Judgment:   Poor  Planning:   Poor  Affect:    Flat  Mood:    Hopeless  Insight:   Lacking   Chief Complaint:     Chief Complaint  Patient presents with  . Other  . 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  Had B average  Legal History:  Denies   Careers adviser:  Denies   Religious/Spiritual Preferences:  [employer]  Family/Childhood History:                           Born in [place]; has 3 brother and 2 sisters; Mother Father StepFather, StepMother   Children/Grand-children:   0  Natural/Informal Support:                           God and mother, sister   Substance Use:  There are suspicions of tobacco abuse reported by the patient. Smokes Methol Cigarettes 3-10 cigarettes daily since age of 9     Medical History:   Past Medical History  Diagnosis Date  . Diabetes mellitus without complication   . Hypertension           Medication List       This list is accurate as of: 04/24/15 10:40 AM.  Always use your most recent med list.               ACCU-CHEK FASTCLIX LANCETS Misc     ACCU-CHEK SMARTVIEW test strip  Generic drug:  glucose blood     aspirin EC 81 MG tablet  Take 81 mg by mouth.  atorvastatin 40 MG tablet  Commonly known as:  LIPITOR  Take by mouth.     calcium citrate-vitamin D 315-200 MG-UNIT per tablet  Commonly known as:  CITRACAL+D  Take by mouth.     FIFTY50 GLUCOSE METER 2.0 W/DEVICE Kit  Check blood sugar 3 times a day. Dx E11.9     glipiZIDE 5 MG 24 hr tablet  Commonly known as:  GLUCOTROL XL  Take 5 mg by mouth.     KLOR-CON 10 10 MEQ tablet  Generic drug:  potassium chloride  Take by mouth.     losartan-hydrochlorothiazide 50-12.5 MG per tablet  Commonly known as:  HYZAAR  Take by mouth.     metFORMIN 1000 MG tablet  Commonly known as:  GLUCOPHAGE  Take by mouth.     nicotine polacrilex 4 MG lozenge  Commonly known as:  COMMIT     RELION LANCING DEVICE Misc     traZODone 150 MG tablet  Commonly known as:  DESYREL     Vitamin D (Ergocalciferol) 50000 UNITS Caps capsule  Commonly known as:  DRISDOL  Take by mouth.              Sexual History:   History  Sexual Activity  . Sexual Activity: Not on  file     Abuse/Trauma History: Denies    Psychiatric History:  [facility] in 2002 for 2 weeks,    Strengths:   Computer, puzzles, jigsaw puzzles, going to church   Recovery Goals:  "Help with depression, get a place, nutrition, medication."  Hobbies/Interests:               Fishing, "I havent been able to do anything fun due to the voices." socialize, shop, go to [employer]   Challenges/Barriers: voices    Family Med/Psych History: No family history on file.  Risk of Suicide/Violence: low   History of Suicide/Violence:  denies  Psychosis:   Reports hearing voices since 2002; female and female voices, daily, commands at time  Diagnosis:    Schizoaffective Disorder  Impression/DX:  Alicia Rogers is currently diagnosed with Schizoaffective Disorder due to her current symptoms of Hearing voice, seeing things, feelings things, depression, sleep disturbance, poor appetite which began in 2002. Her current concerns is housing; in April 2016 she moved out of her mobile home into her sister's home.    Writer has some concerns about Patient's ability to comply with recommendations. She has not taken her medication in several weeks.  She was recently released from the ER on Tuesday after a Psychiatric appointment.  Patient states that she feels her symptoms are getting worse and she wants to move into an [place].  Recommendation/Plan: Writer recommends Outpatient Therapy at least twice monthly to include but not limited to individual, group and or family therapy.  Medication Management is also recommended to assist with her mood.

## 2015-04-25 ENCOUNTER — Telehealth: Payer: Self-pay | Admitting: Licensed Clinical Social Worker

## 2015-05-05 ENCOUNTER — Ambulatory Visit: Payer: Medicaid Other | Admitting: Licensed Clinical Social Worker

## 2015-05-09 ENCOUNTER — Encounter: Payer: Self-pay | Admitting: Psychiatry

## 2015-05-09 ENCOUNTER — Ambulatory Visit (INDEPENDENT_AMBULATORY_CARE_PROVIDER_SITE_OTHER): Payer: Commercial Managed Care - HMO | Admitting: Psychiatry

## 2015-05-09 VITALS — BP 124/60 | HR 73 | Temp 97.5°F | Ht 63.0 in | Wt 109.2 lb

## 2015-05-09 DIAGNOSIS — F251 Schizoaffective disorder, depressive type: Secondary | ICD-10-CM | POA: Diagnosis not present

## 2015-05-09 MED ORDER — LURASIDONE HCL 80 MG PO TABS: 80.0000 mg | ORAL_TABLET | Freq: Every day | ORAL | Status: DC

## 2015-05-09 MED ORDER — TRAZODONE HCL 150 MG PO TABS: 150.0000 mg | ORAL_TABLET | Freq: Every evening | ORAL | Status: DC | PRN

## 2015-05-09 NOTE — Progress Notes (Signed)
BH MD/PA/NP OP Progress Note  05/09/2015 1:00 PM Alicia Rogers  MRN:  [id number]  Subjective:  Patient returns for follow-up of her schizoaffective disorder. She presents today with her main complaint of wanting to find a place to live. She states that she continues to have auditory hallucinations of people making comments about her food questioning what she is eating and encouraging her not to eat. She also has tactile hallucinations like feeling like someone is touching her. She does not seem particularly disturbed by these. I did remind her that she had been off of her medication for a while or he restarted her Latuda at 40 mg. However she's in the past she has been on 120 mg. Chief Complaint: Want to go to assisted living Chief Complaint    Follow-up; Medication Refill     Visit Diagnosis:  No diagnosis found.  Past Medical History:  Past Medical History  Diagnosis Date  . Diabetes mellitus without complication ([place])   . Hypertension    History reviewed. No pertinent past surgical history. Family History:  Family History  Problem Relation Age of Onset  . Diabetes Mother   . Glaucoma Mother   . Heart disease Mother   . Heart attack Mother   . Depression Mother   . Hypertension Mother   . Diabetes Father   . Glaucoma Father   . Hypertension Father   . Diabetes Sister   . Depression Sister   . Post-traumatic stress disorder Sister    Social History:  Social History   Social History  . Marital Status: Single    Spouse Name: N/A  . Number of Children: N/A  . Years of Education: N/A   Social History Main Topics  . Smoking status: Current Every Day Smoker -- 0.50 packs/day    Types: Cigarettes    Start date: 04/21/1978  . Smokeless tobacco: Never Used  . Alcohol Use: No  . Drug Use: No  . Sexual Activity: No   Other Topics Concern  . None   Social History Narrative   Additional History:   Assessment:   Musculoskeletal: Strength & Muscle Tone: within  normal limits Gait & Station: normal Patient leans: N/A  Psychiatric Specialty Exam: HPI  Review of Systems  Psychiatric/Behavioral: Positive for hallucinations. Negative for depression, suicidal ideas, memory loss and substance abuse. The patient has insomnia. The patient is not nervous/anxious.   All other systems reviewed and are negative.   Blood pressure 124/60, pulse 73, temperature 97.5 F (36.4 C), temperature source Tympanic, height _0  (1.6 m), weight 109 lb 3.2 oz (49.533 kg), SpO2 93 %.Body mass index is 19.35 kg/(m^2).  General Appearance: Disheveled  Eye Contact:  Good  Speech:  Normal Rate  Volume:  Normal  Mood:  Good  Affect:  Euthymic, slight smile  Thought Process:  Linear and Logical  Orientation:  Full (Time, Place, and Person)  Thought Content:  Negative  Suicidal Thoughts:  No  Homicidal Thoughts:  No  Memory:  Immediate;   Good Recent;   Good Remote;   Good  Judgement:  Fair  Insight:  Lacking  Psychomotor Activity:  Negative  Concentration:  Fair  Recall:  [employer] of Knowledge: Fair  Language: Fair  Akathisia:  Negative  Handed:  Rightunknown  AIMS (if indicated):    Assets:  Desire for Improvement  ADL's:  Impaired  Cognition: Impaired,  Mild  Sleep:  Good with trazodone   Is the patient at risk to self?  No. Has the patient been a risk to self in the past 6 months?  No. Has the patient been a risk to self within the distant past?  No. Is the patient a risk to others?  No. Has the patient been a risk to others in the past 6 months?  No. Has the patient been a risk to others within the distant past?  No.  Current Medications: Current Outpatient Prescriptions  Medication Sig Dispense Refill  . ACCU-CHEK FASTCLIX LANCETS MISC     . ACCU-CHEK SMARTVIEW test strip     . aspirin EC 81 MG tablet Take 81 mg by mouth.    Marland Kitchen atorvastatin (LIPITOR) 40 MG tablet Take by mouth.    . Blood Glucose Monitoring Suppl (FIFTY50 GLUCOSE METER 2.0)  W/DEVICE KIT Check blood sugar 3 times a day. Dx E11.9    . calcium citrate-vitamin D (CITRACAL+D) 315-200 MG-UNIT per tablet Take by mouth.    Marland Kitchen glipiZIDE (GLUCOTROL XL) 5 MG 24 hr tablet Take 5 mg by mouth.    Elmore Guise Devices (RELION LANCING DEVICE) MISC     . losartan-hydrochlorothiazide (HYZAAR) 50-12.5 MG per tablet Take by mouth.    . lurasidone (LATUDA) 80 MG TABS tablet Take 1 tablet (80 mg total) by mouth at bedtime. Take with food. 90 tablet 0  . metFORMIN (GLUCOPHAGE) 1000 MG tablet Take by mouth.    . nicotine polacrilex (COMMIT) 4 MG lozenge     . potassium chloride (KLOR-CON 10) 10 MEQ tablet Take by mouth.    . Vitamin D, Ergocalciferol, (DRISDOL) 50000 UNITS CAPS capsule Take by mouth.    . traZODone (DESYREL) 150 MG tablet Take 1 tablet (150 mg total) by mouth at bedtime as needed for sleep. 90 tablet 0   No current facility-administered medications for this visit.    Medical Decision Making:  Established Problem, Stable/Improving (1), Review of Medication Regimen & Side Effects (2) and Review of New Medication or Change in Dosage (2)  Treatment Plan Summary:Medication management and Plan Schizoaffective disorder-we will further titrate patient's Latuda back to her previous doses. Thus she has been getting 40 mg a day we will increase her dose to 80 mg daily. She'll continue trazodone 150 mg at bedtime. It does appear that patient does have a continuous goal of trying to find a place to live. Does appear there is social stressors in place such as possibly her illness impacting her relationship with her family. However patient is calm and appropriate in this appointment. She will follow up in 1 month.   Social-She'll continue a Education officer, museum in this clinic. I did discuss with her that she could explore places to live and Probation officer would assist with any paperwork that is required of a physician in the process. Patient discussed that she has a Education officer, museum with social services that  did provide her with some places to live and [place] to form. However patient states she did not like those places because they were "care homes." Patient states she wants to go to assisted living.   Faith Rogue 05/09/2015, 1:00 PM

## 2015-05-21 ENCOUNTER — Ambulatory Visit (INDEPENDENT_AMBULATORY_CARE_PROVIDER_SITE_OTHER): Payer: Commercial Managed Care - HMO | Admitting: Licensed Clinical Social Worker

## 2015-05-21 DIAGNOSIS — F251 Schizoaffective disorder, depressive type: Secondary | ICD-10-CM | POA: Diagnosis not present

## 2015-05-27 NOTE — Progress Notes (Signed)
   THERAPIST PROGRESS NOTE  Session Time: 45 min  Participation Level: Active  Behavioral Response: CasualAlertAnxious  Type of Therapy: Individual Therapy  Treatment Goals addressed: Coping  Interventions: CBT and Motivational Interviewing  Summary: Alicia Rogers is a 54 y.o. female who presents with symptoms of her diagnosis.  She states that she is currently homeless but couch surfing.  Discussed independent living options and states that she wants to go to  Group home.  She will visit a few today and make a decision by Friday.  She is unsure if she takes her medication daily and unsure of her medication regimen.  She states that she understands the importance of her medication and will make better decision when she finds a permanent home.   Suicidal/Homicidal: Nowithout intent/plan  Therapist Response: Assessed mood.  Explored housing options and allowed her time to list pros and cons of each option.  Reminded her of the importance of medication and consuming it daily.   Plan: Return again in 2 weeks.  Diagnosis: Axis I: Schizoaffective Disorder    Axis II: No diagnosis    Lubertha South 05/27/2015

## 2015-06-06 ENCOUNTER — Ambulatory Visit: Payer: Self-pay | Admitting: Licensed Clinical Social Worker

## 2015-06-09 ENCOUNTER — Encounter: Payer: Self-pay | Admitting: Psychiatry

## 2015-06-09 ENCOUNTER — Ambulatory Visit (INDEPENDENT_AMBULATORY_CARE_PROVIDER_SITE_OTHER): Payer: Commercial Managed Care - HMO | Admitting: Psychiatry

## 2015-06-09 VITALS — BP 110/78 | HR 68 | Temp 97.2°F | Ht 63.0 in | Wt 112.2 lb

## 2015-06-09 DIAGNOSIS — F251 Schizoaffective disorder, depressive type: Secondary | ICD-10-CM

## 2015-06-09 NOTE — Progress Notes (Signed)
BH MD/PA/NP OP Progress Note  06/09/2015 11:24 AM Alicia Rogers  MRN:  [id number]  Subjective:  Patient returns for follow-up of her schizoaffective disorder. Patient indicates she is doing fairly well and feels like her voices are under better control. She states this still there but they're not as strong. She states they make comments such as about what she is eating. She states she's currently living in a hotel but has been accepted to an apartment complex. She indicates she got an email that her mail order prescriptions for both Latuda and trazodone are on the way. I've informed her we will give her some samples today.  Asked about any emotional problems or issues today she states that she is not and that she is feeling "straight." Chief Complaint: Want to go to assisted living Chief Complaint    Follow-up; Medication Refill     Visit Diagnosis:  No diagnosis found.  Past Medical History:  Past Medical History  Diagnosis Date  . Diabetes mellitus without complication ([place])   . Hypertension   . Schizophrenia ([place])   . Diabetes mellitus, type II ([place])   . Depression    History reviewed. No pertinent past surgical history. Family History:  Family History  Problem Relation Age of Onset  . Diabetes Mother   . Glaucoma Mother   . Heart disease Mother   . Heart attack Mother   . Depression Mother   . Hypertension Mother   . Diabetes Father   . Glaucoma Father   . Hypertension Father   . Diabetes Sister   . Depression Sister   . Post-traumatic stress disorder Sister    Social History:  Social History   Social History  . Marital Status: Single    Spouse Name: N/A  . Number of Children: N/A  . Years of Education: N/A   Social History Main Topics  . Smoking status: Current Every Day Smoker -- 0.50 packs/day    Types: Cigarettes    Start date: 04/21/1978  . Smokeless tobacco: Never Used  . Alcohol Use: No  . Drug Use: No  . Sexual Activity: No   Other Topics  Concern  . None   Social History Narrative   Additional History:   Assessment:   Musculoskeletal: Strength & Muscle Tone: within normal limits Gait & Station: normal Patient leans: N/A  Psychiatric Specialty Exam: HPI  Review of Systems  Psychiatric/Behavioral: Positive for hallucinations (decreased and under control.). Negative for depression, suicidal ideas, memory loss and substance abuse. The patient is not nervous/anxious and does not have insomnia (States that with trazodone she is sleeping good.).   All other systems reviewed and are negative.   Blood pressure 110/78, pulse 68, temperature 97.2 F (36.2 C), temperature source Tympanic, height '5\' 3"'  (1.6 m), weight 112 lb 3.2 oz (50.894 kg), SpO2 97 %.Body mass index is 19.88 kg/(m^2).  General Appearance: Disheveled  Eye Contact:  Good  Speech:  Normal Rate  Volume:  Normal  Mood:  Good  Affect:  Euthymic, slight smile  Thought Process:  Linear and Logical  Orientation:  Full (Time, Place, and Person)  Thought Content:  Negative  Suicidal Thoughts:  No  Homicidal Thoughts:  No  Memory:  Immediate;   Good Recent;   Good Remote;   Good  Judgement:  Fair  Insight:  Lacking  Psychomotor Activity:  Negative  Concentration:  Fair  Recall:  [employer] of Knowledge: Fair  Language: Fair  Akathisia:  Negative  Handed:  Rightunknown  AIMS (if indicated):    Assets:  Desire for Improvement  ADL's:  Impaired  Cognition: Impaired,  Mild  Sleep:  Good with trazodone   Is the patient at risk to self?  No. Has the patient been a risk to self in the past 6 months?  No. Has the patient been a risk to self within the distant past?  No. Is the patient a risk to others?  No. Has the patient been a risk to others in the past 6 months?  No. Has the patient been a risk to others within the distant past?  No.  Current Medications: Current Outpatient Prescriptions  Medication Sig Dispense Refill  . ACCU-CHEK FASTCLIX LANCETS  MISC     . ACCU-CHEK SMARTVIEW test strip     . aspirin EC 81 MG tablet Take 81 mg by mouth.    Marland Kitchen atorvastatin (LIPITOR) 40 MG tablet Take by mouth.    . Blood Glucose Monitoring Suppl (FIFTY50 GLUCOSE METER 2.0) W/DEVICE KIT Check blood sugar 3 times a day. Dx E11.9    . calcium citrate-vitamin D (CITRACAL+D) 315-200 MG-UNIT per tablet Take by mouth.    Marland Kitchen glipiZIDE (GLUCOTROL XL) 5 MG 24 hr tablet Take 5 mg by mouth.    [employer] Devices (RELION LANCING DEVICE) MISC     . losartan-hydrochlorothiazide (HYZAAR) 50-12.5 MG per tablet Take by mouth.    . lurasidone (LATUDA) 80 MG TABS tablet Take 1 tablet (80 mg total) by mouth at bedtime. Take with food. 90 tablet 0  . metFORMIN (GLUCOPHAGE) 1000 MG tablet Take by mouth.    . nicotine polacrilex (COMMIT) 4 MG lozenge     . potassium chloride (KLOR-CON 10) 10 MEQ tablet Take by mouth.    . traZODone (DESYREL) 150 MG tablet Take 1 tablet (150 mg total) by mouth at bedtime as needed for sleep. 90 tablet 0  . Vitamin D, Ergocalciferol, (DRISDOL) 50000 UNITS CAPS capsule Take by mouth.     No current facility-administered medications for this visit.    Medical Decision Making:  Established Problem, Stable/Improving (1), Review of Medication Regimen & Side Effects (2) and Review of New Medication or Change in Dosage (2)  Treatment Plan Summary:Medication management and Plan Schizoaffective disorder-Continue Latuda 80 mg daily. She'll continue trazodone 150 mg at bedtime. She indicates she has female orders on the way for these medications. We will give her some Latuda samples today. I've informed her to contact us should there be an issue with her getting a supply from her mail order provider.   Social-She'll continue a Education officer, museum in this clinic.   Follow-up in 2 months.   Faith Rogue 06/09/2015, 11:24 AM

## 2015-07-24 ENCOUNTER — Other Ambulatory Visit: Payer: Self-pay

## 2015-07-24 NOTE — Telephone Encounter (Signed)
pt left a message for a refill on her latuda to be sent in to [facility].  pt has a appt for 08-06-15 and last seen on  06-09-15

## 2015-07-25 MED ORDER — LURASIDONE HCL 80 MG PO TABS: 80.0000 mg | ORAL_TABLET | Freq: Every day | ORAL | Status: DC

## 2015-08-06 ENCOUNTER — Ambulatory Visit (INDEPENDENT_AMBULATORY_CARE_PROVIDER_SITE_OTHER): Payer: Medicare HMO | Admitting: Psychiatry

## 2015-08-06 ENCOUNTER — Encounter: Payer: Self-pay | Admitting: Psychiatry

## 2015-08-06 VITALS — BP 138/80 | HR 78 | Temp 97.2°F | Wt 113.6 lb

## 2015-08-06 DIAGNOSIS — F251 Schizoaffective disorder, depressive type: Secondary | ICD-10-CM | POA: Diagnosis not present

## 2015-08-06 MED ORDER — LURASIDONE HCL 120 MG PO TABS: 120.0000 mg | ORAL_TABLET | Freq: Every day | ORAL | Status: DC

## 2015-08-06 MED ORDER — TRAZODONE HCL 150 MG PO TABS
150.0000 mg | ORAL_TABLET | Freq: Every evening | ORAL | Status: DC | PRN
Start: 2015-08-06 — End: 2015-11-27

## 2015-08-06 NOTE — Progress Notes (Signed)
BH MD/PA/NP OP Progress Note  08/06/2015 12:36 PM Alicia Rogers  MRN:  [id number]  Subjective:  Patient returns for follow-up of her schizoaffective disorder. Patient indicates she is doing fairly well. Feels like the voices have decreased. She states that as opposed to being continuous and they calm when she wakes up in the morning and then when she goes to bed in the morning. She is interested in going back on her previous dose of Latuda which was 120 mg daily. She is currently living in a motel and feels good about his living situation aside from it being expensive. In the past she's always been apprehensive to go into any kind of group home where her fines would be confiscated by the facility.  Discharge of voices as at baseline and that they comment on what she is doing such as saying where she going, which she going to eat. She denies that they're command in nature or encourage her to harm herself or others. Chief Complaint: Want any increase and Latuda Chief Complaint    Medication Refill; Follow-up     Visit Diagnosis:     ICD-9-CM ICD-10-CM   1. Schizoaffective disorder, depressive type ([place]) 295.70 F25.1     Past Medical History:  Past Medical History  Diagnosis Date  . Diabetes mellitus without complication ([place])   . Hypertension   . Schizophrenia ([place])   . Diabetes mellitus, type II ([place])   . Depression    History reviewed. No pertinent past surgical history. Family History:  Family History  Problem Relation Age of Onset  . Diabetes Mother   . Glaucoma Mother   . Heart disease Mother   . Heart attack Mother   . Depression Mother   . Hypertension Mother   . Diabetes Father   . Glaucoma Father   . Hypertension Father   . Diabetes Sister   . Depression Sister   . Post-traumatic stress disorder Sister    Social History:  Social History   Social History  . Marital Status: Single    Spouse Name: N/A  . Number of Children: N/A  . Years of Education: N/A    Social History Main Topics  . Smoking status: Current Every Day Smoker -- 0.50 packs/day    Types: Cigarettes    Start date: 04/21/1978  . Smokeless tobacco: Never Used  . Alcohol Use: No  . Drug Use: No  . Sexual Activity: No   Other Topics Concern  . None   Social History Narrative   Additional History:   Assessment:   Musculoskeletal: Strength & Muscle Tone: within normal limits Gait & Station: normal Patient leans: N/A  Psychiatric Specialty Exam: HPI  Review of Systems  Psychiatric/Behavioral: Positive for hallucinations (decreased and under control.). Negative for depression, suicidal ideas, memory loss and substance abuse. The patient is not nervous/anxious and does not have insomnia (States that with trazodone she is sleeping good.).   All other systems reviewed and are negative.   Blood pressure 138/80, pulse 78, temperature 97.2 F (36.2 C), temperature source Tympanic, weight 113 lb 9.6 oz (51.529 kg), SpO2 99 %.Body mass index is 20.13 kg/(m^2).  General Appearance: Disheveled  Eye Contact:  Good  Speech:  Normal Rate  Volume:  Normal  Mood:  Good  Affect:  Constricted  Thought Process:  Linear and Logical  Orientation:  Full (Time, Place, and Person)  Thought Content:  Negative  Suicidal Thoughts:  No  Homicidal Thoughts:  No  Memory:  Immediate;  Good Recent;   Good Remote;   Good  Judgement:  Fair  Insight:  Lacking  Psychomotor Activity:  Negative  Concentration:  Fair  Recall:  [employer] of Knowledge: Fair  Language: Fair  Akathisia:  Negative  Handed:  Rightunknown  AIMS (if indicated):    Assets:  Desire for Improvement  ADL's:  Impaired  Cognition: Impaired,  Mild  Sleep:  Good with trazodone   Is the patient at risk to self?  No. Has the patient been a risk to self in the past 6 months?  No. Has the patient been a risk to self within the distant past?  No. Is the patient a risk to others?  No. Has the patient been a risk to  others in the past 6 months?  No. Has the patient been a risk to others within the distant past?  No.  Current Medications: Current Outpatient Prescriptions  Medication Sig Dispense Refill  . ACCU-CHEK FASTCLIX LANCETS MISC     . ACCU-CHEK SMARTVIEW test strip     . Blood Glucose Monitoring Suppl (FIFTY50 GLUCOSE METER 2.0) W/DEVICE KIT Check blood sugar 3 times a day. Dx E11.9    . calcium citrate-vitamin D (CITRACAL+D) 315-200 MG-UNIT per tablet Take by mouth.    Marland Kitchen glipiZIDE (GLUCOTROL XL) 5 MG 24 hr tablet Take 5 mg by mouth.    [employer] Devices (RELION LANCING DEVICE) MISC     . nicotine polacrilex (COMMIT) 4 MG lozenge     . traZODone (DESYREL) 150 MG tablet Take 1 tablet (150 mg total) by mouth at bedtime as needed for sleep. 90 tablet 1  . Vitamin D, Ergocalciferol, (DRISDOL) 50000 UNITS CAPS capsule Take by mouth.    Marland Kitchen atorvastatin (LIPITOR) 40 MG tablet Take by mouth.    . losartan-hydrochlorothiazide (HYZAAR) 50-12.5 MG per tablet Take by mouth.    . Lurasidone HCl 120 MG TABS Take 1 tablet (120 mg total) by mouth daily. With food 30 tablet 4  . metFORMIN (GLUCOPHAGE) 1000 MG tablet Take by mouth.    . potassium chloride (KLOR-CON 10) 10 MEQ tablet Take by mouth.     No current facility-administered medications for this visit.    Medical Decision Making:  Established Problem, Stable/Improving (1), Review of Medication Regimen & Side Effects (2) and Review of New Medication or Change in Dosage (2)  Treatment Plan Summary:Medication management and Plan Schizoaffective disorder-we'll increase her back to her previous dose of Latuda from 80 mg daily to 120 mg daily. She'll continue trazodone 150 mg at bedtime.    Social-She'll continue a Education officer, museum in this clinic.   Follow-up in 1 months. Been made aware of my departure from the clinic next month, however she will see me one more time in follow-up.   Alicia Rogers 08/06/2015, 12:36 PM

## 2015-09-05 ENCOUNTER — Ambulatory Visit: Payer: Commercial Managed Care - HMO | Admitting: Psychiatry

## 2015-09-22 ENCOUNTER — Encounter: Payer: Self-pay | Admitting: Psychiatry

## 2015-09-22 ENCOUNTER — Ambulatory Visit (INDEPENDENT_AMBULATORY_CARE_PROVIDER_SITE_OTHER): Payer: Medicare HMO | Admitting: Psychiatry

## 2015-09-22 DIAGNOSIS — F209 Schizophrenia, unspecified: Secondary | ICD-10-CM | POA: Diagnosis not present

## 2015-09-22 NOTE — Progress Notes (Signed)
Patient ID: Alicia Rogers, female   DOB: 1960-10-26, 55 y.o.   MRN: [id number] [facility] MD/PA/NP OP Progress Note  09/22/2015 9:27 AM Alicia Rogers  MRN:  [id number]  Subjective:  Patient is a 55 yo AAF with Schizoaffective disorder previously seen by Dr. Jimmye Norman. Patient returns for follow-up of her schizoaffective disorder. This is is the first visit for this patient with this clinician. Patient reports that the increase in [place] has not helped with her voices. She continues to hear voices, one female and one female that tell her "not to eat, sleep or drink and to not be religious". She also reports seeing visions of cartoon like people. States she hears the voices in the morning and in the evening mostly. She reports sleeping ok half the time. She is currently living at her uncle`s house. 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 Patient presents neatly groomed and is polite and cooperative. Appears to be an improvement from her previous visit. She denies that her voices  command in nature or encourage her to harm herself or others. Patient reports that her diabetes is under control.  Chief Complaint: still hear voices, but better than before. Chief Complaint    Follow-up; Medication Refill     Visit Diagnosis:   No diagnosis found.  Past Medical History:  Past Medical History  Diagnosis Date  . Diabetes mellitus without complication ([place])   . Hypertension   . Schizophrenia ([place])   . Diabetes mellitus, type II ([place])   . Depression    History reviewed. No pertinent past surgical history. Family History:  Family History  Problem Relation Age of Onset  . Diabetes Mother   . Glaucoma Mother   . Heart disease Mother   . Heart attack Mother   . Depression Mother   . Hypertension Mother   . Diabetes Father   . Glaucoma Father   . Hypertension Father   . Diabetes Sister   . Depression Sister   . Post-traumatic stress disorder Sister    Social  History:  Social History   Social History  . Marital Status: Single    Spouse Name: N/A  . Number of Children: N/A  . Years of Education: N/A   Social History Main Topics  . Smoking status: Current Every Day Smoker -- 0.50 packs/day    Types: Cigarettes    Start date: 04/21/1978  . Smokeless tobacco: Never Used  . Alcohol Use: No  . Drug Use: No  . Sexual Activity: No   Other Topics Concern  . None   Social History Narrative   Additional History:   Assessment:   Musculoskeletal: Strength & Muscle Tone: within normal limits Gait & Station: normal Patient leans: N/A  Psychiatric Specialty Exam: HPI  Review of Systems  Psychiatric/Behavioral: Positive for hallucinations (decreased and under control.). Negative for depression, suicidal ideas, memory loss and substance abuse. The patient is not nervous/anxious and does not have insomnia (States that with trazodone she is sleeping good.).   All other systems reviewed and are negative.   Blood pressure 110/62, pulse 58, temperature 97 F (36.1 C), temperature source Tympanic, height _0  (1.6 m), weight 120 lb 9.6 oz (54.704 kg), SpO2 96 %.Body mass index is 21.37 kg/(m^2).  General Appearance: Neatly groomed   Eye Contact:  Good  Speech:  Normal Rate  Volume:  Normal  Mood:  Good  Affect:  Constricted  Thought Process:  Linear and Logical  Orientation:  Full (Time, Place, and Person)  Thought Content:  Negative  Suicidal Thoughts:  No  Homicidal Thoughts:  No  Memory:  Immediate;   Good Recent;   Good Remote;   Good  Judgement:  Fair  Insight:  Present   Psychomotor Activity:  Negative  Concentration:  Fair  Recall:  [place] of Knowledge: Fair  Language: Fair  Akathisia:  Negative  Handed:  Rightunknown  AIMS (if indicated):    Assets:  Desire for Improvement  ADL's:  Fair   Cognition: Fair   Sleep:  Good with trazodone   Is the patient at risk to self?  No. Has the patient been a risk to self in  the past 6 months?  No. Has the patient been a risk to self within the distant past?  No. Is the patient a risk to others?  No. Has the patient been a risk to others in the past 6 months?  No. Has the patient been a risk to others within the distant past?  No.  Current Medications: Current Outpatient Prescriptions  Medication Sig Dispense Refill  . ACCU-CHEK FASTCLIX LANCETS MISC     . ACCU-CHEK SMARTVIEW test strip     . Blood Glucose Monitoring Suppl (FIFTY50 GLUCOSE METER 2.0) W/DEVICE KIT Check blood sugar 3 times a day. Dx E11.9    . calcium citrate-vitamin D (CITRACAL+D) 315-200 MG-UNIT per tablet Take by mouth.    Marland Kitchen glipiZIDE (GLUCOTROL XL) 5 MG 24 hr tablet Take 5 mg by mouth.    Elmore Guise Devices (RELION LANCING DEVICE) MISC     . Lurasidone HCl 120 MG TABS Take 1 tablet (120 mg total) by mouth daily. With food 30 tablet 4  . traZODone (DESYREL) 150 MG tablet Take 1 tablet (150 mg total) by mouth at bedtime as needed for sleep. 90 tablet 1  . Vitamin D, Ergocalciferol, (DRISDOL) 50000 UNITS CAPS capsule Take by mouth.    Marland Kitchen atorvastatin (LIPITOR) 40 MG tablet Take by mouth.    . losartan-hydrochlorothiazide (HYZAAR) 50-12.5 MG per tablet Take by mouth.    . metFORMIN (GLUCOPHAGE) 1000 MG tablet Take by mouth.    . nicotine polacrilex (COMMIT) 4 MG lozenge     . potassium chloride (KLOR-CON 10) 10 MEQ tablet Take by mouth.     No current facility-administered medications for this visit.    Medical Decision Making:  Established Problem, Stable/Improving (1), Review of Medication Regimen & Side Effects (2) and Review of New Medication or Change in Dosage (2)  Treatment Plan Summary:Medication management and Plan   Schizoaffective disorder- continue Latuda at 120 milligrams daily and continue to monitor. Patient appears to have improved functionally since her last visit. She is living independently and also has a part-time job. Will continue to monitor her symptoms.   Continue  trazodone 150 mg at bedtime.   Follow-up in 1 month or call before if necessary.   Carmie Lanpher 09/22/2015, 9:27 AM

## 2015-10-20 ENCOUNTER — Ambulatory Visit: Payer: Medicaid Other | Admitting: Psychiatry

## 2015-11-08 ENCOUNTER — Encounter: Payer: Self-pay | Admitting: Medical Oncology

## 2015-11-08 ENCOUNTER — Emergency Department
Admission: EM | Admit: 2015-11-08 | Discharge: 2015-11-08 | Disposition: A | Discharge status: Home / Self Care | Payer: Medicare HMO | Attending: Emergency Medicine | Admitting: Emergency Medicine

## 2015-11-08 DIAGNOSIS — L03213 Periorbital cellulitis: Secondary | ICD-10-CM

## 2015-11-08 DIAGNOSIS — Z79899 Other long term (current) drug therapy: Secondary | ICD-10-CM | POA: Insufficient documentation

## 2015-11-08 DIAGNOSIS — E119 Type 2 diabetes mellitus without complications: Secondary | ICD-10-CM | POA: Diagnosis not present

## 2015-11-08 DIAGNOSIS — H00034 Abscess of left upper eyelid: Secondary | ICD-10-CM | POA: Diagnosis not present

## 2015-11-08 DIAGNOSIS — F209 Schizophrenia, unspecified: Secondary | ICD-10-CM | POA: Diagnosis not present

## 2015-11-08 DIAGNOSIS — H00035 Abscess of left lower eyelid: Secondary | ICD-10-CM | POA: Insufficient documentation

## 2015-11-08 DIAGNOSIS — F329 Major depressive disorder, single episode, unspecified: Secondary | ICD-10-CM | POA: Diagnosis not present

## 2015-11-08 DIAGNOSIS — I10 Essential (primary) hypertension: Secondary | ICD-10-CM | POA: Insufficient documentation

## 2015-11-08 DIAGNOSIS — F17210 Nicotine dependence, cigarettes, uncomplicated: Secondary | ICD-10-CM | POA: Insufficient documentation

## 2015-11-08 DIAGNOSIS — Z7984 Long term (current) use of oral hypoglycemic drugs: Secondary | ICD-10-CM | POA: Insufficient documentation

## 2015-11-08 DIAGNOSIS — H02844 Edema of left upper eyelid: Secondary | ICD-10-CM | POA: Diagnosis present

## 2015-11-08 MED ORDER — CLINDAMYCIN HCL 300 MG PO CAPS: 300.0000 mg | ORAL_CAPSULE | Freq: Three times a day (TID) | ORAL | Status: DC

## 2015-11-08 NOTE — ED Notes (Signed)
Pt called from lobby x4 with no response. First nurse wasn't able to locate the pt in lobby. This tech will try again shortly

## 2015-11-08 NOTE — Discharge Instructions (Signed)
Preseptal Cellulitis, Adult    Preseptal cellulitis--also called periorbital cellulitis--is an infection that can affect your eyelid and the soft tissues or skin that surround your eye. The infection may also affect the structures that produce and drain your tears. It does not affect your eye itself.  CAUSES  This condition may be caused by:  Bacterial infection.  Long-term (chronic) sinus infections.  An object (foreign body) that is stuck behind the eye.  An injury that:  Goes through the eyelid tissues.  Causes an infection, such as an insect sting. Fracture of the bone around the eye.  Infections that have spread from the eyelid or other structures around the eye.  Bite wounds.  Inflammation or infection of the lining membranes of the brain (meningitis).  An infection in the blood (septicemia).  Dental infection (abscess).  Viral infection. This is rare. RISK FACTORS  Risk factors for preseptal cellulitis include:  Participating in activities that increase your risk of trauma to the face or head, such as boxing or high-speed activities.  Having a weakened defense system (immune system).  Medical conditions, such as nasal polyps, that increase your risk for frequent or recurrent sinus infections.  Not receiving regular dental care. SYMPTOMS  Symptoms of this condition usually come on suddenly. Symptoms may include:  Red, hot, and swollen eyelids.  Fever.  Difficulty opening your eye.  Eye pain. DIAGNOSIS  This condition may be diagnosed by an eye exam. You may also have tests, such as:  Blood tests.  CT scan.  MRI.  Spinal tap (lumbar puncture). This is a procedure that involves removing and examining a small amount of the fluid that surrounds the brain and spinal cord. This checks for meningitis. TREATMENT  Treatment for this condition will include antibiotic medicines. These may be given by mouth (orally), through an IV, or as a shot. Your health care provider may also  recommend nasal decongestants to reduce swelling.  HOME CARE INSTRUCTIONS  Take your antibiotic medicine as directed by your health care provider. Finish all of it even if you start to feel better.  Take medicines only as directed by your health care provider.  Drink enough fluid to keep your urine clear or pale yellow.  Do not use any tobacco products, including cigarettes, chewing tobacco, or electronic cigarettes. If you need help quitting, ask your health care provider.  Keep all follow-up visits as directed by your health care provider. These include any visits with an eye specialist (ophthalmologist) or dentist. SEEK MEDICAL CARE IF:  You have a fever.  Your eyelids become more red, warm, or swollen.  You have new symptoms.  Your symptoms do not get better with treatment. SEEK IMMEDIATE MEDICAL CARE IF:  You develop double vision, or your vision becomes blurred or worsens in any way.  You have trouble moving your eyes.  Your eye looks like it is sticking out or bulging out (proptosis).  You develop a severe headache, severe neck pain, or neck stiffness.  You develop repeated vomiting. This information is not intended to replace advice given to you by your health care provider. Make sure you discuss any questions you have with your health care provider.  Document Released: 08/21/2010 Document Revised: 12/03/2014 Document Reviewed: 07/15/2014  Elsevier Interactive Patient Education [employer].    Please take medications as prescribed and return to the emergency department for any increased pain worsening symptoms urgent changes in health.

## 2015-11-08 NOTE — ED Notes (Signed)
NAD noted at time of D/C. Pt denies questions or concerns. Pt ambulatory to the lobby at this time.  

## 2015-11-08 NOTE — ED Notes (Signed)
Pt back in from outside smoking

## 2015-11-08 NOTE — ED Notes (Signed)
Pt woke up this am with left eye swelling. Denies pain/drainage/itching. Pt also reports a cough.

## 2015-11-08 NOTE — ED Provider Notes (Signed)
CSN: [id number]     Arrival date & time 11/08/15  1313 History   First MD Initiated Contact with Patient 11/08/15 1419     Chief Complaint  Patient presents with  . Eye Problem     (Consider location/radiation/quality/duration/timing/severity/associated sxs/prior Treatment) HPI  55 year old female presents to the emergency department for evaluation of left eye swelling. Patient states this morning she woke up with her eye swollen shut, she has redness and pressure along the upper and lower eyelid. Pain is minimal. She denies any drainage or vision changes. Pain is mostly along the upper eyelid, no pain throughout the orbit, no pain with extraocular movement of the eye. No fevers or headaches. Patient is not taking any medications for pain. Eyes any nausea or vomiting.   Past Medical History  Diagnosis Date  . Diabetes mellitus without complication ([place])   . Hypertension   . Schizophrenia ([place])   . Diabetes mellitus, type II ([place])   . Depression    History reviewed. No pertinent past surgical history. Family History  Problem Relation Age of Onset  . Diabetes Mother   . Glaucoma Mother   . Heart disease Mother   . Heart attack Mother   . Depression Mother   . Hypertension Mother   . Diabetes Father   . Glaucoma Father   . Hypertension Father   . Diabetes Sister   . Depression Sister   . Post-traumatic stress disorder Sister    Social History  Substance Use Topics  . Smoking status: Current Every Day Smoker -- 0.50 packs/day    Types: Cigarettes    Start date: 04/21/1978  . Smokeless tobacco: Never Used  . Alcohol Use: No   OB History    No data available     Review of Systems  Constitutional: Negative for fever, chills, activity change and fatigue.  HENT: Negative for congestion, sinus pressure and sore throat.   Eyes: Negative for photophobia, pain, discharge, itching and visual disturbance.  Respiratory: Negative for cough, chest tightness and shortness of breath.    Cardiovascular: Negative for chest pain and leg swelling.  Gastrointestinal: Negative for nausea, vomiting, abdominal pain and diarrhea.  Genitourinary: Negative for dysuria.  Musculoskeletal: Negative for arthralgias and gait problem.  Skin: Negative for rash.  Neurological: Negative for weakness, numbness and headaches.  Hematological: Negative for adenopathy.  Psychiatric/Behavioral: Negative for behavioral problems, confusion and agitation.      Allergies  Penicillins  Home Medications   Prior to Admission medications   Medication Sig Start Date End Date Taking? Authorizing Provider  ACCU-CHEK FASTCLIX LANCETS [place]  02/17/15   Historical Provider, MD  ACCU-CHEK SMARTVIEW test strip  04/02/15   Historical Provider, MD  amLODipine (NORVASC) 5 MG tablet Take 5 mg by mouth. 08/20/15 08/12/16  Historical Provider, MD  atorvastatin (LIPITOR) 40 MG tablet Take by mouth. 09/09/14 08/01/15  Historical Provider, MD  atorvastatin (LIPITOR) 40 MG tablet  08/20/15   Historical Provider, MD  Blood Glucose Monitoring Suppl (FIFTY50 GLUCOSE METER 2.0) W/DEVICE KIT Check blood sugar 3 times a day. Dx E11.9 07/04/14   Historical Provider, MD  calcium citrate-vitamin D (CITRACAL+D) 315-200 MG-UNIT per tablet Take by mouth. 03/24/15 03/23/16  Historical Provider, MD  clindamycin (CLEOCIN) 300 MG capsule Take 1 capsule (300 mg total) by mouth 3 (three) times daily. X 7 days 11/08/15   Duanne Guess, PA-C  glipiZIDE (GLUCOTROL XL) 5 MG 24 hr tablet Take 5 mg by mouth. 01/20/15 01/20/16  Historical Provider, MD  Lancet Devices (RELION LANCING DEVICE) MISC  05/09/14   Historical Provider, MD  losartan-hydrochlorothiazide (HYZAAR) 50-12.5 MG per tablet Take by mouth. 09/09/14 08/01/15  Historical Provider, MD  Lurasidone HCl 120 MG TABS Take 1 tablet (120 mg total) by mouth daily. With food 08/06/15   Marjie Skiff, MD  metFORMIN (GLUCOPHAGE) 1000 MG tablet Take by mouth. 09/09/14 08/01/15  Historical Provider, MD   metFORMIN (GLUCOPHAGE) 1000 MG tablet  08/20/15   Historical Provider, MD  nicotine polacrilex (COMMIT) 4 MG lozenge  03/29/14   Historical Provider, MD  potassium chloride (KLOR-CON 10) 10 MEQ tablet Take by mouth. 09/09/14 08/01/15  Historical Provider, MD  traZODone (DESYREL) 150 MG tablet Take 1 tablet (150 mg total) by mouth at bedtime as needed for sleep. 08/06/15   Marjie Skiff, MD  Vitamin D, Ergocalciferol, (DRISDOL) 50000 UNITS CAPS capsule Take by mouth. 07/02/14   Historical Provider, MD   BP 173/71 mmHg  Pulse 90  Temp(Src) 98.8 F (37.1 C) (Oral)  Resp 18  Ht _0  (1.575 m)  Wt 54.432 kg  BMI 21.94 kg/m2  SpO2 100% Physical Exam  Constitutional: She is oriented to person, place, and time. She appears well-developed and well-nourished. No distress.  HENT:  Head: Normocephalic and atraumatic.  Mouth/Throat: Oropharynx is clear and moist.  Eyes: EOM are normal. Pupils are equal, round, and reactive to light. Right eye exhibits no discharge. Left eye exhibits no discharge and no hordeolum. No foreign body present in the left eye. Right conjunctiva is not injected. Right conjunctiva has no hemorrhage. Left conjunctiva is not injected. Left conjunctiva has no hemorrhage. Right eye exhibits normal extraocular motion. Left eye exhibits normal extraocular motion.  Examination of the left upper and lower eyelids shows moderate swelling with mild erythema. Minimal tenderness to palpation. Patient has normal extraocular movement of the eye with no discomfort. There is no drainage or conjunctival erythema. Right eye appears normal with no soft tissue swelling or no erythema.  Neck: Normal range of motion. Neck supple.  Cardiovascular: Normal rate, regular rhythm and intact distal pulses.   Pulmonary/Chest: Effort normal and breath sounds normal. No respiratory distress. She exhibits no tenderness.  Abdominal: Soft. She exhibits no distension. There is no tenderness.  Musculoskeletal:  Normal range of motion. She exhibits no edema.  Neurological: She is alert and oriented to person, place, and time. She has normal reflexes.  Skin: Skin is warm and dry.  Psychiatric: She has a normal mood and affect. Her behavior is normal. Thought content normal.    ED Course  Procedures (including critical care time) Labs Review Labs Reviewed - No data to display  Imaging Review No results found. I have personally reviewed and evaluated these images and lab results as part of my medical decision-making.   EKG Interpretation None      MDM   Final diagnoses:  Preseptal cellulitis of left eye    55 year old female with left upper and lower eyelid soft tissue swelling. Mild erythema. Concern for preseptal cellulitis. Patient with minimal pain. Swelling is just along the left eye no right eye involvement. No concern for orbital cellulitis due to minimal pain and no orbital pain with extraocular movement. Patient afebrile vital signs are stable. She'll be started on clindamycin 300 mg 3 times a day 7 days. Return to the ER for any worsening symptoms or urgent changes in her health.    Duanne Guess, PA-C 11/08/15 [place], MD 11/08/15 [phone]

## 2015-11-27 ENCOUNTER — Ambulatory Visit (INDEPENDENT_AMBULATORY_CARE_PROVIDER_SITE_OTHER): Payer: Medicare HMO | Admitting: Psychiatry

## 2015-11-27 ENCOUNTER — Encounter: Payer: Self-pay | Admitting: Psychiatry

## 2015-11-27 VITALS — BP 120/68 | HR 91 | Temp 97.2°F | Ht 62.0 in | Wt 120.0 lb

## 2015-11-27 DIAGNOSIS — F251 Schizoaffective disorder, depressive type: Secondary | ICD-10-CM | POA: Diagnosis not present

## 2015-11-27 MED ORDER — SERTRALINE HCL 25 MG PO TABS: 25.0000 mg | ORAL_TABLET | Freq: Every day | ORAL | Status: DC

## 2015-11-27 MED ORDER — TRAZODONE HCL 150 MG PO TABS: 150.0000 mg | ORAL_TABLET | Freq: Every evening | ORAL | Status: DC | PRN

## 2015-11-27 MED ORDER — LURASIDONE HCL 120 MG PO TABS: 120.0000 mg | ORAL_TABLET | Freq: Every day | ORAL | Status: DC

## 2015-11-27 NOTE — Progress Notes (Signed)
Patient ID: Alicia Rogers, female   DOB: February 15, 1961, 55 y.o.   MRN: [id number]  [facility] MD/PA/NP OP Progress Note  11/27/2015 11:23 AM SEBA MADOLE  MRN:  [id number]  Subjective:  Patient is a 55 yo AAF with Schizoaffective disorder. She reports taking the [place] daily, denies any side effects. 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 Continues to hear voices that say. " What are you going to drink and eat?" . She is now living with her mother. Reporting depressed mood. Looks slightly disheveled as compared to past visit.  Denies suicidal thoughts.   Chief Complaint: still hear voices, bother me Chief Complaint    Follow-up; Medication Refill     Visit Diagnosis:     ICD-9-CM ICD-10-CM   1. Schizoaffective disorder, depressive type ([place]) 295.70 F25.1     Past Medical History:  Past Medical History  Diagnosis Date  . Diabetes mellitus without complication ([place])   . Hypertension   . Schizophrenia ([place])   . Diabetes mellitus, type II ([place])   . Depression    History reviewed. No pertinent past surgical history. Family History:  Family History  Problem Relation Age of Onset  . Diabetes Mother   . Glaucoma Mother   . Heart disease Mother   . Heart attack Mother   . Depression Mother   . Hypertension Mother   . Diabetes Father   . Glaucoma Father   . Hypertension Father   . Diabetes Sister   . Depression Sister   . Post-traumatic stress disorder Sister    Social History:  Social History   Social History  . Marital Status: Single    Spouse Name: N/A  . Number of Children: N/A  . Years of Education: N/A   Social History Main Topics  . Smoking status: Current Every Day Smoker -- 0.50 packs/day    Types: Cigarettes    Start date: 04/21/1978  . Smokeless tobacco: Never Used  . Alcohol Use: No  . Drug Use: No  . Sexual Activity: No   Other Topics Concern  . None   Social History Narrative   Additional History:   Assessment:    Musculoskeletal: Strength & Muscle Tone: within normal limits Gait & Station: normal Patient leans: N/A  Psychiatric Specialty Exam: HPI  Review of Systems  Psychiatric/Behavioral: Positive for hallucinations (decreased and under control.). Negative for depression, suicidal ideas, memory loss and substance abuse. The patient is not nervous/anxious and does not have insomnia (States that with trazodone she is sleeping good.).   All other systems reviewed and are negative.   Blood pressure 120/68, pulse 91, temperature 97.2 F (36.2 C), temperature source Tympanic, height '5\' 2"'  (1.575 m), weight 120 lb (54.432 kg), SpO2 92 %.Body mass index is 21.94 kg/(m^2).  General Appearance: casual, hair slightly disheveled  Eye Contact:  Good  Speech:  Normal Rate  Volume:  Normal  Mood:  depressed  Affect:  Constricted  Thought Process:  Linear and Logical  Orientation:  Full (Time, Place, and Person)  Thought Content:  Negative  Suicidal Thoughts:  No  Homicidal Thoughts:  No  Memory:  Immediate;   Good Recent;   Good Remote;   Good  Judgement:  Fair  Insight:  Present   Psychomotor Activity:  Negative  Concentration:  Fair  Recall:  [employer] of Knowledge: Fair  Language: Fair  Akathisia:  Negative  Handed:  Rightunknown  AIMS (if indicated):  Assets:  Desire for Improvement  ADL's:  Fair   Cognition: Fair   Sleep:  Not sleeping as well   Is the patient at risk to self?  No. Has the patient been a risk to self in the past 6 months?  No. Has the patient been a risk to self within the distant past?  No. Is the patient a risk to others?  No. Has the patient been a risk to others in the past 6 months?  No. Has the patient been a risk to others within the distant past?  No.  Current Medications: Current Outpatient Prescriptions  Medication Sig Dispense Refill  . ACCU-CHEK FASTCLIX LANCETS MISC     . ACCU-CHEK SMARTVIEW test strip     . amLODipine (NORVASC) 5 MG tablet  Take 5 mg by mouth.    Marland Kitchen atorvastatin (LIPITOR) 40 MG tablet     . Blood Glucose Monitoring Suppl (FIFTY50 GLUCOSE METER 2.0) W/DEVICE KIT Check blood sugar 3 times a day. Dx E11.9    . calcium citrate-vitamin D (CITRACAL+D) 315-200 MG-UNIT per tablet Take by mouth.    . clindamycin (CLEOCIN) 300 MG capsule Take 1 capsule (300 mg total) by mouth 3 (three) times daily. X 7 days 21 capsule 0  . glipiZIDE (GLUCOTROL XL) 5 MG 24 hr tablet Take 5 mg by mouth.    Elmore Guise Devices (RELION LANCING DEVICE) MISC     . losartan-hydrochlorothiazide (HYZAAR) 50-12.5 MG tablet     . Lurasidone HCl 120 MG TABS Take 1 tablet (120 mg total) by mouth daily. With food 30 tablet 4  . metFORMIN (GLUCOPHAGE) 1000 MG tablet     . nicotine polacrilex (COMMIT) 4 MG lozenge     . traZODone (DESYREL) 150 MG tablet Take 1 tablet (150 mg total) by mouth at bedtime as needed for sleep. 90 tablet 1  . Vitamin D, Ergocalciferol, (DRISDOL) 50000 UNITS CAPS capsule Take by mouth.     No current facility-administered medications for this visit.    Medical Decision Making:  Established Problem, Stable/Improving (1), Review of Medication Regimen & Side Effects (2) and Review of New Medication or Change in Dosage (2)  Treatment Plan Summary:Medication management and Plan   Schizoaffective disorder- continue Latuda at 120 milligrams daily and continue to monitor.  Start Zoloft at 15m po qd to target depressed mood.  Insomnia Continue Trazodone at 1569mpo qhs  Patient to obtain lipid panel labs prior to her next visit.  Follow-up in 1 month or call before if necessary.   Cartel Mauss 11/27/2015, 11:23 AM

## 2015-12-24 ENCOUNTER — Ambulatory Visit: Payer: Medicaid Other | Admitting: Psychiatry

## 2015-12-30 ENCOUNTER — Ambulatory Visit: Payer: Medicare HMO | Admitting: Psychiatry

## 2015-12-30 ENCOUNTER — Ambulatory Visit (INDEPENDENT_AMBULATORY_CARE_PROVIDER_SITE_OTHER): Payer: Medicare HMO | Admitting: Psychiatry

## 2015-12-30 ENCOUNTER — Encounter: Payer: Self-pay | Admitting: Psychiatry

## 2015-12-30 VITALS — BP 122/68 | HR 84 | Temp 97.8°F | Ht 62.0 in | Wt 116.4 lb

## 2015-12-30 DIAGNOSIS — F251 Schizoaffective disorder, depressive type: Secondary | ICD-10-CM | POA: Diagnosis not present

## 2015-12-30 NOTE — Progress Notes (Signed)
Patient ID: Alicia Rogers, female   DOB: 06-Aug-1960, 55 y.o.   MRN: [id number]  [facility] MD/PA/NP OP Progress Note  12/30/2015 2:22 PM AMYRIA KOMAR  MRN:  [id number]  Subjective:  Patient is a 55 yo AAF with Schizoaffective disorder. Patient states she is doing okay, feels depressed. States that she used to feel better before. States that she has not started taking the Zoloft since she did not get it. Discussed with her that the medication was called in . Patient also reports that she continues to hear the voices and that the voices say "what are you going to eat what are going to drink ". States that do bother her but patient does not observed to be responding to any internal stimuli. She denies any suicidal thoughts. She has been compliant with the lead to do and the trazodone at bedtime. States that she sleeps well but she does not have a great appetite. She is better groomed than before. She has started working for [employer] and likes it. States she is living with her mother.  Chief Complaint: still hear voices, bother me Chief Complaint    Follow-up; Medication Refill     Visit Diagnosis:     ICD-9-CM ICD-10-CM   1. Schizoaffective disorder, depressive type ([place]) 295.70 F25.1     Past Medical History:  Past Medical History  Diagnosis Date  . Diabetes mellitus without complication ([place])   . Hypertension   . Schizophrenia ([place])   . Diabetes mellitus, type II ([place])   . Depression    History reviewed. No pertinent past surgical history. Family History:  Family History  Problem Relation Age of Onset  . Diabetes Mother   . Glaucoma Mother   . Heart disease Mother   . Heart attack Mother   . Depression Mother   . Hypertension Mother   . Diabetes Father   . Glaucoma Father   . Hypertension Father   . Diabetes Sister   . Depression Sister   . Post-traumatic stress disorder Sister    Social History:  Social History   Social History  . Marital Status: Single    Spouse Name: N/A   . Number of Children: N/A  . Years of Education: N/A   Social History Main Topics  . Smoking status: Current Every Day Smoker -- 0.50 packs/day    Types: Cigarettes    Start date: 04/21/1978  . Smokeless tobacco: Never Used  . Alcohol Use: No  . Drug Use: No  . Sexual Activity: No   Other Topics Concern  . None   Social History Narrative   Additional History:   Assessment:   Musculoskeletal: Strength & Muscle Tone: within normal limits Gait & Station: normal Patient leans: N/A  Psychiatric Specialty Exam: HPI  Review of Systems  Psychiatric/Behavioral: Positive for hallucinations (decreased and under control.). Negative for depression, suicidal ideas, memory loss and substance abuse. The patient is not nervous/anxious and does not have insomnia (States that with trazodone she is sleeping good.).   All other systems reviewed and are negative.   Blood pressure 122/68, pulse 84, temperature 97.8 F (36.6 C), temperature source Tympanic, height _0  (1.575 m), weight 116 lb 6.4 oz (52.799 kg), SpO2 92 %.Body mass index is 21.28 kg/(m^2).  General Appearance: casual, hair slightly disheveled  Eye Contact:  Good  Speech:  Normal Rate  Volume:  Normal  Mood:  better  Affect:  Constricted  Thought Process:  Linear and Logical  Orientation:  Full (  Time, Place, and Person)  Thought Content:  Negative  Suicidal Thoughts:  No  Homicidal Thoughts:  No  Memory:  Immediate;   Good Recent;   Good Remote;   Good  Judgement:  Fair  Insight:  Present   Psychomotor Activity:  Negative  Concentration:  Fair  Recall:  [place] of Knowledge: Fair  Language: Fair  Akathisia:  Negative  Handed:  Right  AIMS (if indicated):    Assets:  Desire for Improvement  ADL's:  Fair   Cognition: Fair   Sleep:  Not sleeping as well   Is the patient at risk to self?  No. Has the patient been a risk to self in the past 6 months?  No. Has the patient been a risk to self within the  distant past?  No. Is the patient a risk to others?  No. Has the patient been a risk to others in the past 6 months?  No. Has the patient been a risk to others within the distant past?  No.  Current Medications: Current Outpatient Prescriptions  Medication Sig Dispense Refill  . ACCU-CHEK FASTCLIX LANCETS MISC     . ACCU-CHEK SMARTVIEW test strip     . amLODipine (NORVASC) 5 MG tablet Take 5 mg by mouth.    Marland Kitchen atorvastatin (LIPITOR) 40 MG tablet     . Blood Glucose Monitoring Suppl (FIFTY50 GLUCOSE METER 2.0) W/DEVICE KIT Check blood sugar 3 times a day. Dx E11.9    . calcium citrate-vitamin D (CITRACAL+D) 315-200 MG-UNIT per tablet Take by mouth.    . clindamycin (CLEOCIN) 300 MG capsule Take 1 capsule (300 mg total) by mouth 3 (three) times daily. X 7 days 21 capsule 0  . glipiZIDE (GLUCOTROL XL) 5 MG 24 hr tablet Take 5 mg by mouth.    Elmore Guise Devices (RELION LANCING DEVICE) MISC     . losartan-hydrochlorothiazide (HYZAAR) 50-12.5 MG tablet     . Lurasidone HCl 120 MG TABS Take 1 tablet (120 mg total) by mouth daily. With food 30 tablet 4  . metFORMIN (GLUCOPHAGE) 1000 MG tablet     . nicotine polacrilex (COMMIT) 4 MG lozenge     . sertraline (ZOLOFT) 25 MG tablet Take 1 tablet (25 mg total) by mouth daily. 30 tablet 2  . traZODone (DESYREL) 150 MG tablet Take 1 tablet (150 mg total) by mouth at bedtime as needed for sleep. 90 tablet 1  . Vitamin D, Ergocalciferol, (DRISDOL) 50000 UNITS CAPS capsule Take by mouth.     No current facility-administered medications for this visit.    Medical Decision Making:  Established Problem, Stable/Improving (1), Review of Medication Regimen & Side Effects (2) and Review of New Medication or Change in Dosage (2)  Treatment Plan Summary:Medication management and Plan   Schizoaffective disorder- continue Latuda at 120 milligrams daily and continue to monitor.  Patient told to start Zoloft at 33m po qd to target depressed  mood.  Insomnia Continue Trazodone at 1586mpo qhs  Patient reports she forgot to get her labs done and requested another lab slip.  Follow-up in 1 month or call before if necessary.   Nalany Steedley 12/30/2015, 2:22 PM

## 2016-03-12 DIAGNOSIS — R29818 Other symptoms and signs involving the nervous system: Secondary | ICD-10-CM | POA: Insufficient documentation

## 2016-03-20 ENCOUNTER — Emergency Department: Payer: Medicare HMO

## 2016-03-20 ENCOUNTER — Encounter: Payer: Self-pay | Admitting: Emergency Medicine

## 2016-03-20 ENCOUNTER — Emergency Department
Admission: EM | Admit: 2016-03-20 | Discharge: 2016-03-20 | Disposition: A | Discharge status: Home / Self Care | Payer: Medicare HMO | Attending: Emergency Medicine | Admitting: Emergency Medicine

## 2016-03-20 DIAGNOSIS — G8929 Other chronic pain: Secondary | ICD-10-CM | POA: Diagnosis not present

## 2016-03-20 DIAGNOSIS — Z7984 Long term (current) use of oral hypoglycemic drugs: Secondary | ICD-10-CM | POA: Insufficient documentation

## 2016-03-20 DIAGNOSIS — E119 Type 2 diabetes mellitus without complications: Secondary | ICD-10-CM | POA: Diagnosis not present

## 2016-03-20 DIAGNOSIS — I10 Essential (primary) hypertension: Secondary | ICD-10-CM | POA: Diagnosis not present

## 2016-03-20 DIAGNOSIS — Z79899 Other long term (current) drug therapy: Secondary | ICD-10-CM | POA: Diagnosis not present

## 2016-03-20 DIAGNOSIS — F17210 Nicotine dependence, cigarettes, uncomplicated: Secondary | ICD-10-CM | POA: Diagnosis not present

## 2016-03-20 DIAGNOSIS — M25522 Pain in left elbow: Secondary | ICD-10-CM | POA: Insufficient documentation

## 2016-03-20 MED ORDER — MELOXICAM 15 MG PO TABS: 15.0000 mg | ORAL_TABLET | Freq: Every day | ORAL | 0 refills | Status: DC
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
## 2016-03-20 NOTE — ED Provider Notes (Signed)
[facility] Emergency Department Provider Note  ____________________________________________  Time seen: Approximately 11:51 AM  I have reviewed the triage vital signs and the nursing notes.   HISTORY  Chief Complaint Arm Pain    HPI Alicia Rogers is a 55 y.o. female who presents for evaluation of left arm pain intermittently since 1993. Patient reports no new injury but hasn't increased pain since yesterday. Patient reports that the pain is worse with movement.   Past Medical History:  Diagnosis Date  . Depression   . Diabetes mellitus without complication ([place])   . Diabetes mellitus, type II ([place])   . Hypertension   . Schizophrenia [facility])     Patient Active Problem List   Diagnosis Date Noted  . Compulsive tobacco user syndrome 10/11/2014  . BP (high blood pressure) 09/04/2014  . 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  . Claudication ([place]) 06/09/2014  . Schizoaffective disorder, depressive type ([place]) 06/09/2014  . Diabetes mellitus, type 2 ([place]) 06/09/2014  . Type 2 diabetes mellitus ([place]) 06/09/2014  . Controlled type 2 diabetes mellitus without complication ([place]) 97/58/8325    History reviewed. No pertinent surgical history.  Prior to Admission medications   Medication Sig Start Date End Date Taking? Authorizing Provider  ACCU-CHEK FASTCLIX LANCETS [place]  02/17/15   Historical Provider, MD  ACCU-CHEK SMARTVIEW test strip  04/02/15   Historical Provider, MD  amLODipine (NORVASC) 5 MG tablet Take 5 mg by mouth. 08/20/15 08/12/16  Historical Provider, MD  atorvastatin (LIPITOR) 40 MG tablet  08/20/15   Historical Provider, MD  Blood Glucose Monitoring Suppl (FIFTY50 GLUCOSE METER 2.0) W/DEVICE KIT Check blood sugar 3 times a day. Dx E11.9 07/04/14   Historical Provider, MD  calcium citrate-vitamin D (CITRACAL+D) 315-200 MG-UNIT per tablet Take by mouth. 03/24/15 03/23/16  Historical Provider, MD  clindamycin (CLEOCIN) 300 MG  capsule Take 1 capsule (300 mg total) by mouth 3 (three) times daily. X 7 days 11/08/15   Duanne Guess, PA-C  glipiZIDE (GLUCOTROL XL) 5 MG 24 hr tablet Take 5 mg by mouth. 01/20/15 01/20/16  Historical Provider, MD  Lancet Devices (RELION LANCING DEVICE) [place]  05/09/14   Historical Provider, MD  losartan-hydrochlorothiazide Konrad Penta) 50-12.5 MG tablet  08/20/15   Historical Provider, MD  Lurasidone HCl 120 MG TABS Take 1 tablet (120 mg total) by mouth daily. With food 11/27/15   Himabindu Einar Grad, MD  meloxicam (MOBIC) 15 MG tablet Take 1 tablet (15 mg total) by mouth daily. 03/20/16   Arlyss Repress, PA-C  metFORMIN (GLUCOPHAGE) 1000 MG tablet  08/20/15   Historical Provider, MD  nicotine polacrilex (COMMIT) 4 MG lozenge  03/29/14   Historical Provider, MD  sertraline (ZOLOFT) 25 MG tablet Take 1 tablet (25 mg total) by mouth daily. 11/27/15 11/26/16  Himabindu Ravi, MD  traZODone (DESYREL) 150 MG tablet Take 1 tablet (150 mg total) by mouth at bedtime as needed for sleep. 11/27/15   Himabindu Ravi, MD  Vitamin D, Ergocalciferol, (DRISDOL) 50000 UNITS CAPS capsule Take by mouth. 07/02/14   Historical Provider, MD    Allergies Penicillins  Family History  Problem Relation Age of Onset  . Diabetes Mother   . Glaucoma Mother   . Heart disease Mother   . Heart attack Mother   . Depression Mother   . Hypertension Mother   . Diabetes Father   . Glaucoma Father   . Hypertension Father   . Diabetes Sister   . Depression Sister   .  Post-traumatic stress disorder Sister     Social History Social History  Substance Use Topics  . Smoking status: Current Every Day Smoker    Packs/day: 1.00    Types: Cigarettes    Start date: 04/21/1978  . Smokeless tobacco: Never Used  . Alcohol use No    Review of Systems Constitutional: No fever/chills Cardiovascular: Denies chest pain. Respiratory: Denies shortness of breath. Genitourinary: Negative for dysuria. Musculoskeletal: Positive for left arm pain.  Specifically at the elbow region. Skin: Negative for rash. Neurological: Negative for headaches, focal weakness or numbness.  10-point ROS otherwise negative.  ____________________________________________   PHYSICAL EXAM:  VITAL SIGNS: ED Triage Vitals  Enc Vitals Group     BP 03/20/16 1131 (!) 190/62     Pulse Rate 03/20/16 1131 71     Resp 03/20/16 1131 18     Temp 03/20/16 1131 98.4 F (36.9 C)     Temp Source 03/20/16 1131 Oral     SpO2 03/20/16 1131 100 %     Weight 03/20/16 1132 123 lb (55.8 kg)     Height 03/20/16 1132 _0  (1.575 m)     Head Circumference --      Peak Flow --      Pain Score 03/20/16 1132 10     Pain Loc --      Pain Edu? --      Excl. in [place]? --     Constitutional: Alert and oriented. Well appearing and in no acute distress. Musculoskeletal: Positive left elbow pain. No ecchymosis bruising swelling or edema noted. Neurologic:  Normal speech and language. No gross focal neurologic deficits are appreciated. No gait instability. Skin:  Skin is warm, dry and intact. No rash noted. Psychiatric: Mood and affect are normal. Speech and behavior are normal.  ____________________________________________   LABS (all labs ordered are listed, but only abnormal results are displayed)  Labs Reviewed - No data to display ____________________________________________  EKG   ____________________________________________  RADIOLOGY  No acute osseous findings ____________________________________________   PROCEDURES  Procedure(s) performed: None  Critical Care performed: No  ____________________________________________   INITIAL IMPRESSION / ASSESSMENT AND PLAN / ED COURSE  Pertinent labs & imaging results that were available during my care of the patient were reviewed by me and considered in my medical decision making (see chart for details). Review of the [place] CSRS was performed in accordance of the [place] prior to dispensing any controlled  drugs.  Recurrent left elbow pain. Rx given for meloxicam 15 mg daily. She is follow-up with PCP or return to ER with any worsening symptomology.  Clinical Course    ____________________________________________   FINAL CLINICAL IMPRESSION(S) / ED DIAGNOSES  Final diagnoses:  Elbow pain, chronic, left     This chart was dictated using voice recognition software/Dragon. Despite best efforts to proofread, errors can occur which can change the meaning. Any change was purely unintentional.    Arlyss Repress, PA-C 03/20/16 [place], MD 03/23/16 [phone]

## 2016-03-20 NOTE — ED Notes (Signed)

## 2016-03-20 NOTE — ED Notes (Signed)
Pt given drink 

## 2016-06-01 ENCOUNTER — Ambulatory Visit: Payer: Medicare HMO | Admitting: Psychiatry

## 2016-06-08 ENCOUNTER — Ambulatory Visit (INDEPENDENT_AMBULATORY_CARE_PROVIDER_SITE_OTHER): Payer: Medicare HMO | Admitting: Psychiatry

## 2016-06-08 ENCOUNTER — Encounter: Payer: Self-pay | Admitting: Psychiatry

## 2016-06-08 VITALS — BP 142/77 | HR 60 | Temp 98.4°F | Resp 16 | Wt 120.0 lb

## 2016-06-08 DIAGNOSIS — F251 Schizoaffective disorder, depressive type: Secondary | ICD-10-CM

## 2016-06-08 MED ORDER — LURASIDONE HCL 120 MG PO TABS: 120.0000 mg | ORAL_TABLET | Freq: Every day | ORAL | 4 refills | Status: DC

## 2016-06-08 MED ORDER — TRAZODONE HCL 150 MG PO TABS: 150.0000 mg | ORAL_TABLET | Freq: Every evening | ORAL | 1 refills | Status: DC | PRN
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
## 2016-06-17 ENCOUNTER — Encounter (HOSPITAL_COMMUNITY): Payer: Self-pay | Admitting: Emergency Medicine

## 2016-06-17 ENCOUNTER — Ambulatory Visit (HOSPITAL_COMMUNITY)
Admission: AD | Admit: 2016-06-17 | Discharge: 2016-06-17 | Disposition: A | Discharge status: Home / Self Care | Payer: Medicare HMO | Source: Home / Self Care | Attending: Psychiatry | Admitting: Psychiatry

## 2016-06-17 ENCOUNTER — Emergency Department (HOSPITAL_COMMUNITY)
Admission: EM | Admit: 2016-06-17 | Discharge: 2016-06-18 | Disposition: A | Discharge status: Other Acute Inpatient Hospital | Payer: Medicare HMO | Attending: Emergency Medicine | Admitting: Emergency Medicine

## 2016-06-17 DIAGNOSIS — Z79899 Other long term (current) drug therapy: Secondary | ICD-10-CM | POA: Diagnosis not present

## 2016-06-17 DIAGNOSIS — Z7984 Long term (current) use of oral hypoglycemic drugs: Secondary | ICD-10-CM | POA: Diagnosis not present

## 2016-06-17 DIAGNOSIS — F17210 Nicotine dependence, cigarettes, uncomplicated: Secondary | ICD-10-CM | POA: Insufficient documentation

## 2016-06-17 DIAGNOSIS — F251 Schizoaffective disorder, depressive type: Secondary | ICD-10-CM | POA: Insufficient documentation

## 2016-06-17 DIAGNOSIS — E119 Type 2 diabetes mellitus without complications: Secondary | ICD-10-CM | POA: Insufficient documentation

## 2016-06-17 DIAGNOSIS — Z833 Family history of diabetes mellitus: Secondary | ICD-10-CM | POA: Diagnosis not present

## 2016-06-17 DIAGNOSIS — I10 Essential (primary) hypertension: Secondary | ICD-10-CM | POA: Diagnosis not present

## 2016-06-17 DIAGNOSIS — R440 Auditory hallucinations: Secondary | ICD-10-CM | POA: Diagnosis present

## 2016-06-17 DIAGNOSIS — Z8249 Family history of ischemic heart disease and other diseases of the circulatory system: Secondary | ICD-10-CM | POA: Diagnosis not present

## 2016-06-17 DIAGNOSIS — R443 Hallucinations, unspecified: Secondary | ICD-10-CM

## 2016-06-17 LAB — CBC
HEMATOCRIT: 38.5 % (ref 36.0–46.0)
HEMOGLOBIN: 12.6 g/dL (ref 12.0–15.0)
MCH: 29.2 pg (ref 26.0–34.0)
MCHC: 32.7 g/dL (ref 30.0–36.0)
MCV: 89.1 fL (ref 78.0–100.0)
Platelets: 311 10*3/uL (ref 150–400)
RBC: 4.32 MIL/uL (ref 3.87–5.11)
RDW: 12.8 % (ref 11.5–15.5)
WBC: 9.9 10*3/uL (ref 4.0–10.5)

## 2016-06-17 NOTE — ED Triage Notes (Signed)
Pt presents from [facility] needing medical clearance.  Pt states she went over there because she is schizophrenic and the auditory and visual hallucinations are getting worse. Has been taking her medications as prescribed. Denies SI/HI.

## 2016-06-17 NOTE — ED Notes (Signed)
Patient wanded by security.  In bags: tan bra, gray jacket, pink pants, white socks, tennis shoes, pink shirt, pink pocket book, and stripped bag.

## 2016-06-17 NOTE — ED Provider Notes (Signed)
[facility] DEPT Provider Note   CSN: [id number] Arrival date & time: 06/17/16  2321  By signing my name below, I, Hansel Feinstein, attest that this documentation has been prepared under the direction and in the presence of Charlann Lange, PA-C. Electronically Signed: Hansel Feinstein, ED Scribe. 06/17/16. 11:46 PM.    History   Chief Complaint Chief Complaint  Patient presents with  . Medical Clearance    HPI Alicia Rogers is a 55 y.o. female with h/o schizophrenia who presents to the Emergency Department from [facility] complaining of worsening auditory hallucinations this week. Pt denies hearing voices that tell her to hurt herself or others. Pt states she has h/o similar symptoms, but feels that they are not being well controlled by her current medications. She states she has been compliant with her medications as prescribed. Pt states she does not feel safe, but if feeling well physically. She denies SI or HI, fever, nausea, vomiting.    The history is provided by the patient. No language interpreter was used.    Past Medical History:  Diagnosis Date  . Depression   . Diabetes mellitus without complication ([place])   . Diabetes mellitus, type II ([place])   . Hypertension   . Schizophrenia [facility])     Patient Active Problem List   Diagnosis Date Noted  . Compulsive tobacco user syndrome 10/11/2014  . BP (high blood pressure) 09/04/2014  . 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  . Claudication ([place]) 06/09/2014  . Schizoaffective disorder, depressive type ([place]) 06/09/2014  . Diabetes mellitus, type 2 ([place]) 06/09/2014  . Type 2 diabetes mellitus ([place]) 06/09/2014  . Controlled type 2 diabetes mellitus without complication ([place]) 51/70/0174    History reviewed. No pertinent surgical history.  OB History    No data available       Home Medications    Prior to Admission medications   Medication Sig Start Date End Date Taking? Authorizing Provider  ACCU-CHEK  FASTCLIX LANCETS [place]  02/17/15   Historical Provider, MD  ACCU-CHEK SMARTVIEW test strip  04/02/15   Historical Provider, MD  amLODipine (NORVASC) 5 MG tablet Take 5 mg by mouth. 08/20/15 08/12/16  Historical Provider, MD  atorvastatin (LIPITOR) 40 MG tablet  08/20/15   Historical Provider, MD  Blood Glucose Monitoring Suppl (FIFTY50 GLUCOSE METER 2.0) W/DEVICE KIT Check blood sugar 3 times a day. Dx E11.9 07/04/14   Historical Provider, MD  calcium citrate-vitamin D (CITRACAL+D) 315-200 MG-UNIT per tablet Take by mouth. 03/24/15 03/23/16  Historical Provider, MD  clindamycin (CLEOCIN) 300 MG capsule Take 1 capsule (300 mg total) by mouth 3 (three) times daily. X 7 days 11/08/15   Duanne Guess, PA-C  glipiZIDE (GLUCOTROL XL) 5 MG 24 hr tablet Take 5 mg by mouth. 01/20/15 01/20/16  Historical Provider, MD  Lancet Devices (RELION LANCING DEVICE) [place]  05/09/14   Historical Provider, MD  losartan-hydrochlorothiazide Konrad Penta) 50-12.5 MG tablet  08/20/15   Historical Provider, MD  Lurasidone HCl 120 MG TABS Take 1 tablet (120 mg total) by mouth daily. With food 06/08/16   Himabindu Einar Grad, MD  meloxicam (MOBIC) 15 MG tablet Take 1 tablet (15 mg total) by mouth daily. 03/20/16   Arlyss Repress, PA-C  metFORMIN (GLUCOPHAGE) 1000 MG tablet  08/20/15   Historical Provider, MD  nicotine polacrilex (COMMIT) 4 MG lozenge  03/29/14   Historical Provider, MD  traZODone (DESYREL) 150 MG tablet Take 1 tablet (150 mg total) by mouth at bedtime as needed for sleep.  06/08/16   Himabindu Ravi, MD  Vitamin D, Ergocalciferol, (DRISDOL) 50000 UNITS CAPS capsule Take by mouth. 07/02/14   Historical Provider, MD    Family History Family History  Problem Relation Age of Onset  . Diabetes Mother   . Glaucoma Mother   . Heart disease Mother   . Heart attack Mother   . Depression Mother   . Hypertension Mother   . Diabetes Father   . Glaucoma Father   . Hypertension Father   . Diabetes Sister   . Depression Sister   .  Post-traumatic stress disorder Sister     Social History Social History  Substance Use Topics  . Smoking status: Current Every Day Smoker    Packs/day: 1.00    Types: Cigarettes    Start date: 04/21/1978  . Smokeless tobacco: Never Used  . Alcohol use No     Allergies   Penicillins   Review of Systems Review of Systems  Constitutional: Negative for fever.  HENT: Negative.   Respiratory: Negative.   Cardiovascular: Negative.   Gastrointestinal: Negative.  Negative for nausea and vomiting.  Musculoskeletal: Negative.   Neurological: Negative.   Psychiatric/Behavioral: Positive for hallucinations. Negative for suicidal ideas.     Physical Exam Updated Vital Signs BP 198/73 (BP Location: Right Arm)   Pulse 69   Temp 98.1 F (36.7 C) (Oral)   Resp 19   Ht '5\' 2"'  (1.575 m)   Wt 119 lb (54 kg)   SpO2 100%   BMI 21.77 kg/m   Physical Exam  Constitutional: She appears well-developed and well-nourished.  HENT:  Head: Normocephalic.  Eyes: Conjunctivae are normal.  Cardiovascular: Normal rate.   No murmur heard. Pulmonary/Chest: Effort normal. No respiratory distress.  Abdominal: She exhibits no distension.  Musculoskeletal: Normal range of motion.  Neurological: She is alert.  Skin: Skin is warm and dry.  Psychiatric: Her affect is blunt. She is actively hallucinating. She expresses no homicidal and no suicidal ideation.  Nursing note and vitals reviewed.    ED Treatments / Results   DIAGNOSTIC STUDIES: Oxygen Saturation is 100% on RA, normal by my interpretation.    COORDINATION OF CARE: 11:44 PM Discussed treatment plan with pt at bedside which includes lab work and pt agreed to plan.    Labs (all labs ordered are listed, but only abnormal results are displayed) Labs Reviewed  COMPREHENSIVE METABOLIC PANEL  ETHANOL  SALICYLATE LEVEL  ACETAMINOPHEN LEVEL  CBC  RAPID URINE DRUG SCREEN, HOSP PERFORMED    EKG  EKG Interpretation None        Radiology No results found.  Procedures Procedures (including critical care time)  Medications Ordered in ED Medications - No data to display   Initial Impression / Assessment and Plan / ED Course  I have reviewed the triage vital signs and the nursing notes.  Pertinent labs & imaging results that were available during my care of the patient were reviewed by me and considered in my medical decision making (see chart for details).  Clinical Course     Patient with history of schizophrenia, currently hallucinating despite stated compliance with medications. TTS consultation to determine disposition.   Final Clinical Impressions(s) / ED Diagnoses   Final diagnoses:  None   1. AV hallucinations  New Prescriptions New Prescriptions   No medications on file    I personally performed the services described in this documentation, which was scribed in my presence. The recorded information has been reviewed and is accurate.  Charlann Lange, PA-C 06/18/16 [id number]    Varney Biles, MD 06/18/16 2145

## 2016-06-17 NOTE — BH Assessment (Addendum)
Tele Assessment Note   Alicia Rogers is an 55 y.o. female came into [facility] voluntarily tonight c/o worsening AVH. Pt denies SI, HI and SHI. Pt sts that she hears voices that usually do not bother her but, lately are telling her not to eat or drink. Pt sts the voices have gotten louder and "talk more." Pt sts she sees cartoon people who are scary and bugs crawling all over everything including her. Pt sts she can feel the bugs crawling on her.  Pt sts she sees/hears/feels these AVH "everyday all day." Most recently, pt sts the voices are telling her they are going to kill her. Pt's symptoms of depression including sadness, fatigue, decreased self esteem, tearfulness / crying spells, difficulty thinking & concentrating, feeling helpless and hopeless, sleep and eating disturbances. Pt sts her sleeping has decreased from 8 hours per night to about 1 hour per night. Pt sts she is compliant with her prescribed medication including trazodone. Pt sts she has been IP once in 2002 at [facility] for Schizoaffective D/O. Pt sts she has not received any OPT. Pt denies any abuse including physical, verbal or sexual. Pt sts she does not have access to a gun. Pt sts she worries "a lot" about her health and if her schizophrenia "is going away or not." Pt sts she has only drank alcohol once recently, receiving a DUI in October 2017 with a court date in November 2017. Pt sts she has no other legal issues. Pt sts that her other significant stressor is that her mother does not wan her to "do anything." Pt sts she likes to be active going to friend's homes and going to a school to use the computers. Pt lives with her mother in [place]. Pt is single and is unemployed due to disability. Pt sees Dr. Blair Dolphin for medication management but no one for OPT. Pt sts that in 2002 she was psychiatrically hospitalized at [facility] for 2 weeks but has no other IP stays for [place] reasons. Pt denies a hx of other legal issues or verbal or physical  aggression. Pt sts she smokes about 1 pack of cigarettes per day but, only drinks alcohol on rare occasions.   Pt was dressed in appropriate, modest street clothes. Pt was alert, cooperative and pleasant. Pt kept good eye contact, spoke in a clear tone and at a normal pace. Pt moved in a stiff manner when moving. Pt's thought process was coherent and relevant and judgement and insight were impaired.  No indication of delusional thinking or response to internal stimuli. Pt's mood was stated as depressed and anxious and her blunted affect was congruent.  Pt was oriented x 4, to person, place, time and situation.   Diagnosis: Schizoaffective D/O, depressive type by hx  Past Medical History:  Past Medical History:  Diagnosis Date  . Depression   . Diabetes mellitus without complication ([place])   . Diabetes mellitus, type II ([place])   . Hypertension   . Schizophrenia ([place])     No past surgical history on file.  Family History:  Family History  Problem Relation Age of Onset  . Diabetes Mother   . Glaucoma Mother   . Heart disease Mother   . Heart attack Mother   . Depression Mother   . Hypertension Mother   . Diabetes Father   . Glaucoma Father   . Hypertension Father   . Diabetes Sister   . Depression Sister   . Post-traumatic stress disorder Sister  Social History:  reports that she has been smoking Cigarettes.  She started smoking about 38 years ago. She has been smoking about 1.00 pack per day. She has never used smokeless tobacco. She reports that she does not drink alcohol or use drugs.  Additional Social History:  Alcohol / Drug Use Prescriptions: Trazodone, Latuda; Metformin, Glipezide, Amlopine, Atorvastatin History of alcohol / drug use?: Yes Longest period of sobriety (when/how long): unknown Substance #1 Name of Substance 1: Tobacco/Nicotine/Cigarettes 1 - Age of First Use: 18 1 - Amount (size/oz): 1 pack 1 - Frequency: daily 1 - Duration: ongoing 1 - Last Use /  Amount: 06/17/16 Substance #2 Name of Substance 2: Alcohol 2 - Age of First Use: unkonwn 2 - Amount (size/oz): 2 beers 2 - Frequency: unknown 2 - Duration: unknown 2 - Last Use / Amount: in October- Pt got a DUI charge  CIWA:   COWS:    PATIENT STRENGTHS: (choose at least two) Communication skills Supportive family/friends  Allergies:  Allergies  Allergen Reactions  . Penicillins     Rash    Home Medications:  (Not in a hospital admission)  OB/GYN Status:  No LMP recorded. Patient is postmenopausal.  General Assessment Data Location of Assessment: [facility] Assessment Services TTS Assessment: In system Is this a Tele or Face-to-Face Assessment?: Face-to-Face Is this an Initial Assessment or a Re-assessment for this encounter?: Initial Assessment Marital status: Single Maiden name:  (na) Is patient pregnant?: Unknown Pregnancy Status: Unknown Living Arrangements: Parent (mom) Can pt return to current living arrangement?: Yes Admission Status: Voluntary Is patient capable of signing voluntary admission?: Yes Referral Source: Self/Family/Friend Insurance type:  Personnel officer Medicare)  Medical Screening Exam ([place]) Medical Exam completed: Yes (MSE by Lindon Romp, NP; Sent to [facility] for medical clearance)  Crisis Care Plan Living Arrangements: Parent (mom) Legal Guardian:  (self) Name of Psychiatrist:  (Dr. Dorena Dew) Name of Therapist:  (none reported)  Education Status Is patient currently in school?: No Highest grade of school patient has completed:  (unknown)  Risk to self with the past 6 months Suicidal Ideation: No (denies) Has patient been a risk to self within the past 6 months prior to admission? : No Suicidal Intent: No Has patient had any suicidal intent within the past 6 months prior to admission? : No Is patient at risk for suicide?: No Suicidal Plan?: No Has patient had any suicidal plan within the past 6 months prior to admission? :  No Access to Means: No (denies access to guns) What has been your use of drugs/alcohol within the last 12 months?:  (periodic use of alcohol) Previous Attempts/Gestures: No (denies) How many times?:  (0) Other Self Harm Risks:  (none reported) Triggers for Past Attempts: None known Intentional Self Injurious Behavior: None Family Suicide History: No Recent stressful life event(s): Conflict (Comment), Legal Issues (DUI in Oct 5366; Conflict w family over "going places") Persecutory voices/beliefs?: No Depression: Yes Depression Symptoms: Fatigue, Guilt, Feeling worthless/self pity (decreased sleep; feeling hopeless & helpless) Substance abuse history and/or treatment for substance abuse?: No Suicide prevention information given to non-admitted patients: Not applicable  Risk to Others within the past 6 months Homicidal Ideation: No (denies) Does patient have any lifetime risk of violence toward others beyond the six months prior to admission? : No (none reported) Thoughts of Harm to Others: No (denies) Current Homicidal Intent: No Current Homicidal Plan: No Access to Homicidal Means: No (sts no access to guns) Identified Victim:  (none reported) History  of harm to others?: No (none documented or reported) Assessment of Violence: None Noted Violent Behavior Description:  (none known) Does patient have access to weapons?: No Criminal Charges Pending?: Yes Describe Pending Criminal Charges:  (DUI in October 2017) Does patient have a court date: Yes Court Date: 06/29/16 Is patient on probation?: No  Psychosis Hallucinations: Auditory, Tactile, Visual (Hears voices, sees cartoon people & feels bugs) Delusions: Unspecified (thinks bugs are crawling on her "everyday all day")  Mental Status Report Appearance/Hygiene: Unremarkable Eye Contact: Fair Motor Activity: Freedom of movement, Rigidity Speech: Logical/coherent Level of Consciousness: Quiet/awake Mood: Depressed Affect:  Blunted Anxiety Level: Minimal Thought Processes: Coherent, Relevant Judgement: Impaired Orientation: Person, Place, Time, Situation Obsessive Compulsive Thoughts/Behaviors: None (none reported)  Cognitive Functioning Concentration: Fair Memory: Recent Intact, Remote Intact IQ: Average (no report or documentation of lower IQ) Insight: Fair Impulse Control: Fair Appetite: Poor Weight Loss:  (unknown) Weight Gain:  (unknown) Sleep: Decreased Total Hours of Sleep:  (1 hour per night for the last week per pt) Vegetative Symptoms: Unable to Assess  ADLScreening [facility] Assessment Services) Patient's cognitive ability adequate to safely complete daily activities?: Yes Patient able to express need for assistance with ADLs?: Yes Independently performs ADLs?: Yes (appropriate for developmental age)  Prior Inpatient Therapy Prior Inpatient Therapy: Yes Prior Therapy Dates:  (2002) Prior Therapy Facilty/Provider(s):  Microbiologist) Reason for Treatment:  (Schizoaffective)  Prior Outpatient Therapy Prior Outpatient Therapy: No Does patient have an ACCT team?: No Does patient have Intensive In-House Services?  : No Does patient have [facility] services? : No Does patient have P4CC services?: No  ADL Screening (condition at time of admission) Patient's cognitive ability adequate to safely complete daily activities?: Yes Patient able to express need for assistance with ADLs?: Yes Independently performs ADLs?: Yes (appropriate for developmental age)       Abuse/Neglect Assessment (Assessment to be complete while patient is alone) Physical Abuse: Denies Verbal Abuse: Denies Sexual Abuse: Denies Exploitation of patient/patient's resources: Denies Self-Neglect: Denies     Regulatory affairs officer (For Healthcare) Does patient have an advance directive?: No Would patient like information on creating an advanced directive?: No - patient declined information    Additional Information 1:1 In Past  12 Months?: No CIRT Risk: No Elopement Risk: No Does patient have medical clearance?: No (MSE by Lindon Romp, NP; Sent to [facility] for medical clearance)     Disposition:  Disposition Initial Assessment Completed for this Encounter: Yes Disposition of Patient: Inpatient treatment program (Per Lindon Romp, NP) Type of inpatient treatment program: Adult (Per AC:No appropriate beds available. Seek placement.)  Anothy Bufano T 06/17/2016 11:58 PM

## 2016-06-18 DIAGNOSIS — Z8249 Family history of ischemic heart disease and other diseases of the circulatory system: Secondary | ICD-10-CM

## 2016-06-18 DIAGNOSIS — Z833 Family history of diabetes mellitus: Secondary | ICD-10-CM

## 2016-06-18 DIAGNOSIS — F17210 Nicotine dependence, cigarettes, uncomplicated: Secondary | ICD-10-CM | POA: Diagnosis not present

## 2016-06-18 DIAGNOSIS — Z79899 Other long term (current) drug therapy: Secondary | ICD-10-CM

## 2016-06-18 DIAGNOSIS — F251 Schizoaffective disorder, depressive type: Secondary | ICD-10-CM

## 2016-06-18 LAB — COMPREHENSIVE METABOLIC PANEL
ALBUMIN: 3.8 g/dL (ref 3.5–5.0)
ALK PHOS: 96 U/L (ref 38–126)
ALT: 15 U/L (ref 14–54)
ANION GAP: 10 (ref 5–15)
AST: 22 U/L (ref 15–41)
BILIRUBIN TOTAL: 0.7 mg/dL (ref 0.3–1.2)
BUN: 9 mg/dL (ref 6–20)
CALCIUM: 9.9 mg/dL (ref 8.9–10.3)
CO2: 28 mmol/L (ref 22–32)
Chloride: 103 mmol/L (ref 101–111)
Creatinine, Ser: 1 mg/dL (ref 0.44–1.00)
GLUCOSE: 169 mg/dL — AB (ref 65–99)
Potassium: 3.6 mmol/L (ref 3.5–5.1)
SODIUM: 141 mmol/L (ref 135–145)
TOTAL PROTEIN: 8 g/dL (ref 6.5–8.1)

## 2016-06-18 LAB — RAPID URINE DRUG SCREEN, HOSP PERFORMED
Amphetamines: NOT DETECTED
BARBITURATES: NOT DETECTED
Benzodiazepines: NOT DETECTED
COCAINE: NOT DETECTED
OPIATES: NOT DETECTED
Tetrahydrocannabinol: NOT DETECTED

## 2016-06-18 LAB — SALICYLATE LEVEL

## 2016-06-18 LAB — ACETAMINOPHEN LEVEL: Acetaminophen (Tylenol), Serum: <10 ug/mL — ABNORMAL LOW (ref 10–30)

## 2016-06-18 LAB — ETHANOL

## 2016-06-18 MED ORDER — TRAZODONE HCL 50 MG PO TABS
150.0000 mg | ORAL_TABLET | Freq: Every evening | ORAL | Status: DC | PRN
  Administered 2016-06-18: 150 mg via ORAL
  Filled 2016-06-18: qty 1

## 2016-06-18 MED ORDER — GLIPIZIDE ER 5 MG PO TB24
5.0000 mg | ORAL_TABLET | Freq: Every day | ORAL | Status: DC
  Administered 2016-06-18: 5 mg via ORAL
  Filled 2016-06-18: qty 1

## 2016-06-18 MED ORDER — LURASIDONE HCL 40 MG PO TABS
120.0000 mg | ORAL_TABLET | Freq: Every day | ORAL | Status: DC
  Administered 2016-06-18: 120 mg via ORAL
  Filled 2016-06-18: qty 3

## 2016-06-18 MED ORDER — HYDROCHLOROTHIAZIDE 12.5 MG PO CAPS
12.5000 mg | ORAL_CAPSULE | Freq: Every day | ORAL | Status: DC
  Administered 2016-06-18: 12.5 mg via ORAL
  Filled 2016-06-18: qty 1

## 2016-06-18 MED ORDER — AMLODIPINE BESYLATE 5 MG PO TABS
5.0000 mg | ORAL_TABLET | Freq: Every day | ORAL | Status: DC
  Administered 2016-06-18: 5 mg via ORAL
  Filled 2016-06-18: qty 1

## 2016-06-18 MED ORDER — METFORMIN HCL 500 MG PO TABS
1000.0000 mg | ORAL_TABLET | Freq: Every day | ORAL | Status: DC
  Administered 2016-06-18: 1000 mg via ORAL
  Filled 2016-06-18: qty 2

## 2016-06-18 MED ORDER — OXCARBAZEPINE 300 MG PO TABS
300.0000 mg | ORAL_TABLET | Freq: Two times a day (BID) | ORAL | Status: DC
  Administered 2016-06-18: 300 mg via ORAL
  Filled 2016-06-18: qty 1

## 2016-06-18 MED ORDER — LOSARTAN POTASSIUM 50 MG PO TABS
50.0000 mg | ORAL_TABLET | Freq: Every day | ORAL | Status: DC
  Administered 2016-06-18: 50 mg via ORAL
  Filled 2016-06-18: qty 1

## 2016-06-18 MED ORDER — ATORVASTATIN CALCIUM 40 MG PO TABS
40.0000 mg | ORAL_TABLET | Freq: Every day | ORAL | Status: DC
  Filled 2016-06-18: qty 1

## 2016-06-18 MED ORDER — LOSARTAN POTASSIUM-HCTZ 50-12.5 MG PO TABS: 1.0000 | ORAL_TABLET | Freq: Every day | ORAL | Status: DC

## 2016-06-18 MED ORDER — NICOTINE 21 MG/24HR TD PT24
21.0000 mg | MEDICATED_PATCH | Freq: Every day | TRANSDERMAL | Status: DC
  Administered 2016-06-18: 21 mg via TRANSDERMAL
  Filled 2016-06-18: qty 1

## 2016-06-18 MED ORDER — ONDANSETRON 4 MG PO TBDP
4.0000 mg | ORAL_TABLET | Freq: Once | ORAL | Status: AC
  Administered 2016-06-18: 4 mg via ORAL
  Filled 2016-06-18: qty 1

## 2016-06-18 NOTE — H&P (Signed)
Behavioral Health Medical Screening Exam  [place] is a 55 y.o. female.  Total Time spent with patient: 20 minutes  Psychiatric Specialty Exam: Physical Exam  Constitutional: She is oriented to person, place, and time. She appears well-developed and well-nourished. No distress.  HENT:  Head: Normocephalic and atraumatic.  Right Ear: External ear normal.  Noted small area of discoloration on left auricle, patient reports that she was stung by a bee last week.  Eyes: Conjunctivae are normal. Right eye exhibits no discharge. Left eye exhibits no discharge. No scleral icterus.  Cardiovascular: Normal rate, regular rhythm and normal heart sounds.   Respiratory: Effort normal and breath sounds normal. No respiratory distress.  Neurological: She is alert and oriented to person, place, and time.  Skin: Skin is warm and dry. She is not diaphoretic.  Psychiatric: Her speech is normal. Judgment and thought content normal. Her mood appears not anxious. Her affect is blunt. Her affect is not angry, not labile and not inappropriate. She is actively hallucinating. She is not agitated, not aggressive, not hyperactive, not slowed, not withdrawn and not combative. Cognition and memory are normal. She exhibits a depressed mood. She is attentive.    Review of Systems  Psychiatric/Behavioral: Positive for depression and hallucinations. Negative for memory loss, substance abuse and suicidal ideas. The patient is not nervous/anxious and does not have insomnia.   All other systems reviewed and are negative.   Blood pressure (!) 199/64, pulse 83, temperature 98.6 F (37 C), resp. rate 18, SpO2 100 %.There is no height or weight on file to calculate BMI.  General Appearance: Casual  Eye Contact:  Fair  Speech:  Clear and Coherent and Normal Rate  Volume:  Normal  Mood:  Depressed  Affect:  Blunt  Thought Process:  Goal Directed  Orientation:  Full (Time, Place, and Person)  Thought Content:   Hallucinations: Auditory  Suicidal Thoughts:  No  Homicidal Thoughts:  No  Memory:  Immediate;   Good Recent;   Good Remote;   Fair  Judgement:  Good  Insight:  Good  Psychomotor Activity:  Normal  Concentration: Concentration: Good and Attention Span: Good  Recall:  Good  Fund of Knowledge:Good  Language: Good  Akathisia:  Negative  Handed:  Right  AIMS (if indicated):     Assets:  Communication Skills Desire for Improvement Financial Resources/Insurance  Sleep:       Musculoskeletal: Strength & Muscle Tone: within normal limits Gait & Station: normal Patient leans: N/A  Blood pressure (!) 199/64, pulse 83, temperature 98.6 F (37 C), resp. rate 18, SpO2 100 %.  Recommendations:  Based on my evaluation the patient appears to have an emergency medical condition for which I recommend the patient be transferred to the emergency department for further evaluation. TTS to disposition.  Alicia Nunnery, NP 06/18/2016, 1:12 AM

## 2016-06-18 NOTE — ED Notes (Signed)
TTS came by and said patient has already been evaluated.  Bed not available at [facility] at this time.  She does meet inpatient criteria.

## 2016-06-18 NOTE — ED Notes (Signed)
Specimen cup placed in patient room for rapid drug screen. Patient instructed to use asap and return to nurses.

## 2016-06-18 NOTE — ED Notes (Addendum)
Patient arrived to unit ambulatory and A&O x4. Patient denies SI/HI but endorses auditory hallucinations. Patient reports hearing voices that are "mean" but cannot specifically qualify them. Patient denies pain. Patient reports coming to the hospital because "the voices have gotten worse" her goal is to "help with that". Patient reports that she has a court date on "November 28th" for a "DWI". Patient denies using alcohol. BAL of <5. Safety check performed. Patient oriented to unit. Patient requested her home medication of trazodone for sleep. Trazodone ordered PRN. Medicated as prescribed. Fluids offered. Patient placed on Q15 minute safety checks. Will continue to monitor.

## 2016-06-18 NOTE — ED Notes (Signed)
Pt discharged ambulatory with [place] driver.  All belongings were sent with pt.  Pt was calm and cooperative.  Pt was in not distress.

## 2016-06-18 NOTE — Progress Notes (Signed)
06/18/16 1342:  LRT introduced self to pt and offered activities.  LRT informed pt of the activities available.  Pt stated she wanted to play [employer].  Pt and LRT played [employer].  Pt was pleasant and bright.  Pt stated her day has been boring.  Pt was focused and concentrating during activity.   Victorino Sparrow, LRT/CTRS

## 2016-06-18 NOTE — Consult Note (Signed)
[facility] Consult   Reason for Consult:  Hallucinations, delusions Referring Physician:  EDP Patient Identification: Alicia Rogers MRN:  [id number] Principal Diagnosis: Schizoaffective disorder, depressive type ([place]) Diagnosis:   Patient Active Problem List   Diagnosis Date Noted  . Schizoaffective disorder, depressive type ([place]) [F25.1] 06/09/2014    Priority: High  . Compulsive tobacco user syndrome [F17.200] 10/11/2014  . BP (high blood pressure) [I10] 09/04/2014  . 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 Claudication ([place]) [I73.9] 06/09/2014  . Diabetes mellitus, type 2 ([place]) [E11.9] 06/09/2014  . Type 2 diabetes mellitus ([place]) [E11.9] 06/09/2014  . Controlled type 2 diabetes mellitus without complication ([place]) [U72.5] 06/09/2014    Total Time spent with patient: 45 minutes  Subjective:   Alicia Rogers is a 55 y.o. female patient complains of seeing people and bugs crawling on her.  HPI:  55 yo female who presented to the ED with an increase in hallucinations (visual and auditory) along with feeling like bugs are crawling on her.  Her psychiatrist sent her here to be evaluated.  She remains psychotic today with pleasant mood.  Alicia Rogers does need things repeated due to the voices.  Inpatient hospitalization needed.  Past Psychiatric History: schizoaffective disorder  Risk to Self: Is patient at risk for suicide?: No, but patient needs Medical Clearance Risk to Others:  none Prior Inpatient Therapy:  yes Prior Outpatient Therapy:  yes  Past Medical History:  Past Medical History:  Diagnosis Date  . Depression   . Diabetes mellitus without complication ([place])   . Diabetes mellitus, type II ([place])   . Hypertension   . Schizophrenia ([place])    History reviewed. No pertinent surgical history. Family History:  Family History  Problem Relation Age of Onset  . Diabetes Mother   . Glaucoma Mother   . Heart disease  Mother   . Heart attack Mother   . Depression Mother   . Hypertension Mother   . Diabetes Father   . Glaucoma Father   . Hypertension Father   . Diabetes Sister   . Depression Sister   . Post-traumatic stress disorder Sister    Family Psychiatric  History: none Social History:  History  Alcohol Use No     History  Drug Use No    Social History   Social History  . Marital status: Single    Spouse name: N/A  . Number of children: N/A  . Years of education: N/A   Social History Main Topics  . Smoking status: Current Every Day Smoker    Packs/day: 1.00    Types: Cigarettes    Start date: 04/21/1978  . Smokeless tobacco: Never Used  . Alcohol use No  . Drug use: No  . Sexual activity: No   Other Topics Concern  . None   Social History Narrative  . None   Additional Social History:    Allergies:   Allergies  Allergen Reactions  . Penicillins     Rash    Labs:  Results for orders placed or performed during the hospital encounter of 06/17/16 (from the past 48 hour(s))  Ethanol     Status: None   Collection Time: 06/17/16 11:39 PM  Result Value Ref Range   Alcohol, Ethyl (B) <5 <5 mg/dL    Comment:        LOWEST DETECTABLE LIMIT FOR SERUM ALCOHOL IS 5 mg/dL FOR MEDICAL PURPOSES ONLY   Salicylate level     Status:  None   Collection Time: 06/17/16 11:39 PM  Result Value Ref Range   Salicylate Lvl <1.7 2.8 - 30.0 mg/dL  Acetaminophen level     Status: Abnormal   Collection Time: 06/17/16 11:39 PM  Result Value Ref Range   Acetaminophen (Tylenol), Serum <10 (L) 10 - 30 ug/mL    Comment:        THERAPEUTIC CONCENTRATIONS VARY SIGNIFICANTLY. A RANGE OF 10-30 ug/mL MAY BE AN EFFECTIVE CONCENTRATION FOR MANY PATIENTS. HOWEVER, SOME ARE BEST TREATED AT CONCENTRATIONS OUTSIDE THIS RANGE. ACETAMINOPHEN CONCENTRATIONS >150 ug/mL AT 4 HOURS AFTER INGESTION AND >50 ug/mL AT 12 HOURS AFTER INGESTION ARE OFTEN ASSOCIATED WITH TOXIC REACTIONS.    Comprehensive metabolic panel     Status: Abnormal   Collection Time: 06/17/16 11:45 PM  Result Value Ref Range   Sodium 141 135 - 145 mmol/L   Potassium 3.6 3.5 - 5.1 mmol/L   Chloride 103 101 - 111 mmol/L   CO2 28 22 - 32 mmol/L   Glucose, Bld 169 (H) 65 - 99 mg/dL   BUN 9 6 - 20 mg/dL   Creatinine, Ser 1.00 0.44 - 1.00 mg/dL   Calcium 9.9 8.9 - 10.3 mg/dL   Total Protein 8.0 6.5 - 8.1 g/dL   Albumin 3.8 3.5 - 5.0 g/dL   AST 22 15 - 41 U/L   ALT 15 14 - 54 U/L   Alkaline Phosphatase 96 38 - 126 U/L   Total Bilirubin 0.7 0.3 - 1.2 mg/dL   GFR calc non Af Amer >60 >60 mL/min   GFR calc Af Amer >60 >60 mL/min    Comment: (NOTE) The eGFR has been calculated using the CKD EPI equation. This calculation has not been validated in all clinical situations. eGFR's persistently <60 mL/min signify possible Chronic Kidney Disease.    Anion gap 10 5 - 15  cbc     Status: None   Collection Time: 06/17/16 11:45 PM  Result Value Ref Range   WBC 9.9 4.0 - 10.5 K/uL   RBC 4.32 3.87 - 5.11 MIL/uL   Hemoglobin 12.6 12.0 - 15.0 g/dL   HCT 38.5 36.0 - 46.0 %   MCV 89.1 78.0 - 100.0 fL   MCH 29.2 26.0 - 34.0 pg   MCHC 32.7 30.0 - 36.0 g/dL   RDW 12.8 11.5 - 15.5 %   Platelets 311 150 - 400 K/uL  Rapid urine drug screen (hospital performed)     Status: None   Collection Time: 06/18/16  4:04 AM  Result Value Ref Range   Opiates NONE DETECTED NONE DETECTED   Cocaine NONE DETECTED NONE DETECTED   Benzodiazepines NONE DETECTED NONE DETECTED   Amphetamines NONE DETECTED NONE DETECTED   Tetrahydrocannabinol NONE DETECTED NONE DETECTED   Barbiturates NONE DETECTED NONE DETECTED    Comment:        DRUG SCREEN FOR MEDICAL PURPOSES ONLY.  IF CONFIRMATION IS NEEDED FOR ANY PURPOSE, NOTIFY LAB WITHIN 5 DAYS.        LOWEST DETECTABLE LIMITS FOR URINE DRUG SCREEN Drug Class       Cutoff (ng/mL) Amphetamine      1000 Barbiturate      200 Benzodiazepine   408 Tricyclics       144 Opiates           300 Cocaine          300 THC              50  Current Facility-Administered Medications  Medication Dose Route Frequency Provider Last Rate Last Dose  . amLODipine (NORVASC) tablet 5 mg  5 mg Oral Daily Charlann Lange, PA-C   5 mg at 06/18/16 0935  . atorvastatin (LIPITOR) tablet 40 mg  40 mg Oral q1800 Shari Upstill, PA-C      . glipiZIDE (GLUCOTROL XL) 24 hr tablet 5 mg  5 mg Oral Q breakfast Charlann Lange, PA-C   5 mg at 06/18/16 0759  . losartan (COZAAR) tablet 50 mg  50 mg Oral Daily Ankit Nanavati, MD   50 mg at 06/18/16 0935   And  . hydrochlorothiazide (MICROZIDE) capsule 12.5 mg  12.5 mg Oral Daily Ankit Nanavati, MD   12.5 mg at 06/18/16 0935  . lurasidone (LATUDA) tablet 120 mg  120 mg Oral Daily Shari Upstill, PA-C   120 mg at 06/18/16 0934  . metFORMIN (GLUCOPHAGE) tablet 1,000 mg  1,000 mg Oral Q breakfast Charlann Lange, PA-C   1,000 mg at 06/18/16 0759  . nicotine (NICODERM CQ - dosed in mg/24 hours) patch 21 mg  21 mg Transdermal Daily Charlann Lange, PA-C   21 mg at 06/18/16 0933  . traZODone (DESYREL) tablet 150 mg  150 mg Oral QHS PRN Charlann Lange, PA-C   150 mg at 06/18/16 0121   Current Outpatient Prescriptions  Medication Sig Dispense Refill  . amLODipine (NORVASC) 5 MG tablet Take 5 mg by mouth.    Marland Kitchen atorvastatin (LIPITOR) 40 MG tablet Take 40 mg by mouth daily at 6 PM.     . glipiZIDE (GLUCOTROL XL) 10 MG 24 hr tablet Take 10 mg by mouth daily.    Marland Kitchen losartan-hydrochlorothiazide (HYZAAR) 50-12.5 MG tablet Take 1 tablet by mouth daily.     . Lurasidone HCl 120 MG TABS Take 1 tablet (120 mg total) by mouth daily. With food 30 tablet 4  . meloxicam (MOBIC) 15 MG tablet Take 1 tablet (15 mg total) by mouth daily. 30 tablet 0  . metFORMIN (GLUCOPHAGE) 1000 MG tablet Take 1,000 mg by mouth daily with breakfast.     . traZODone (DESYREL) 150 MG tablet Take 1 tablet (150 mg total) by mouth at bedtime as needed for sleep. 90 tablet 1  . ACCU-CHEK FASTCLIX LANCETS MISC      . ACCU-CHEK SMARTVIEW test strip     . Blood Glucose Monitoring Suppl (FIFTY50 GLUCOSE METER 2.0) W/DEVICE KIT Check blood sugar 3 times a day. Dx E11.9    . clindamycin (CLEOCIN) 300 MG capsule Take 1 capsule (300 mg total) by mouth 3 (three) times daily. X 7 days (Patient not taking: Reported on 06/18/2016) 21 capsule 0  . glipiZIDE (GLUCOTROL XL) 5 MG 24 hr tablet Take 5 mg by mouth.    Elmore Guise Devices (RELION LANCING DEVICE) MISC       Musculoskeletal: Strength & Muscle Tone: within normal limits Gait & Station: normal Patient leans: N/A  Psychiatric Specialty Exam: Physical Exam  Constitutional: She is oriented to person, place, and time. She appears well-developed and well-nourished.  HENT:  Head: Normocephalic.  Neck: Normal range of motion.  Respiratory: Effort normal.  Musculoskeletal: Normal range of motion.  Neurological: She is alert and oriented to person, place, and time.  Skin: Skin is warm and dry.  Psychiatric: Her speech is normal. Judgment normal. Her mood appears anxious. She is actively hallucinating. Thought content is delusional. Cognition and memory are impaired.    Review of Systems  Constitutional: Negative.   HENT: Negative.  Eyes: Negative.   Respiratory: Negative.   Cardiovascular: Negative.   Gastrointestinal: Negative.   Genitourinary: Negative.   Musculoskeletal: Negative.   Skin: Negative.   Neurological: Negative.   Endo/Heme/Allergies: Negative.   Psychiatric/Behavioral: Positive for hallucinations. The patient is nervous/anxious.     Blood pressure 174/68, pulse 75, temperature 98.8 F (37.1 C), temperature source Oral, resp. rate 18, height '5\' 2"'  (1.575 m), weight 54 kg (119 lb), SpO2 98 %.Body mass index is 21.77 kg/m.  General Appearance: Casual  Eye Contact:  Good  Speech:  Normal Rate  Volume:  Normal  Mood:  Anxious  Affect:  Flat  Thought Process:  Disorganized and Descriptions of Associations: Loose  Orientation:   Other:  person and place  Thought Content:  Delusions and Hallucinations: Auditory Visual  Suicidal Thoughts:  No  Homicidal Thoughts:  No  Memory:  Immediate;   Fair Recent;   Fair Remote;   Fair  Judgement:  Impaired  Insight:  Fair  Psychomotor Activity:  Increased  Concentration:  Concentration: Poor and Attention Span: Poor  Recall:  [employer] of Knowledge:  Fair  Language:  Fair  Akathisia:  No  Handed:  Right  AIMS (if indicated):     Assets:  Leisure Time Physical Health Resilience  ADL's:  Intact  Cognition:  Impaired,  Mild  Sleep:        Treatment Plan Summary: Daily contact with patient to assess and evaluate symptoms and progress in treatment, Medication management and Plan schizoaffective disorder, depressed type:  -Crisis stabilization -Medication management:  Continue medical medications along with Latuda 120 mg daily for psychosis and Trazodone 150 mg at bedtime for sleep.  Started Trileptal 300 mg BID for mood stabilization. -Individual counseling  Disposition: Recommend psychiatric Inpatient admission when medically cleared.  Waylan Boga, NP 06/18/2016 1:21 PM  Patient seen face-to-face for psychiatric evaluation, chart reviewed and case discussed with the physician extender and developed treatment plan. Reviewed the information documented and agree with the treatment plan. Corena Pilgrim, MD

## 2016-06-18 NOTE — BH Assessment (Signed)
[facility] Assessment Progress Note  Per Corena Pilgrim, MD, this pt requires psychiatric hospitalization at this time.  At 15:58 Roderic Palau calls from [facility]; pt has been accepted to their facility by Dr Dareen Piano.  Waylan Boga, DNP, concurs with this decision.  Pt is under voluntary status, and she agrees to transfer.  Pt's nurse, Nena Jordan, has been notified, and agrees to call report to [phone].  Pt is to be transported via [place] to the [facility].  Jalene Mullet, [place] Triage Specialist [phone]

## 2016-07-06 ENCOUNTER — Ambulatory Visit: Payer: Medicare HMO | Admitting: Psychiatry

## 2016-09-08 ENCOUNTER — Ambulatory Visit: Payer: Medicare HMO | Admitting: Psychiatry

## 2016-10-06 ENCOUNTER — Other Ambulatory Visit: Payer: Self-pay | Admitting: Family Medicine

## 2016-10-06 DIAGNOSIS — Z1239 Encounter for other screening for malignant neoplasm of breast: Secondary | ICD-10-CM

## 2016-10-19 ENCOUNTER — Other Ambulatory Visit: Payer: Self-pay

## 2016-10-19 ENCOUNTER — Emergency Department
Admission: EM | Admit: 2016-10-19 | Discharge: 2016-10-19 | Disposition: A | Discharge status: Home / Self Care | Payer: Medicare HMO | Attending: Emergency Medicine | Admitting: Emergency Medicine

## 2016-10-19 ENCOUNTER — Encounter: Payer: Self-pay | Admitting: Emergency Medicine

## 2016-10-19 ENCOUNTER — Emergency Department: Payer: Medicare HMO

## 2016-10-19 DIAGNOSIS — R0789 Other chest pain: Secondary | ICD-10-CM | POA: Diagnosis not present

## 2016-10-19 DIAGNOSIS — F17210 Nicotine dependence, cigarettes, uncomplicated: Secondary | ICD-10-CM | POA: Insufficient documentation

## 2016-10-19 DIAGNOSIS — Z7984 Long term (current) use of oral hypoglycemic drugs: Secondary | ICD-10-CM | POA: Insufficient documentation

## 2016-10-19 DIAGNOSIS — E119 Type 2 diabetes mellitus without complications: Secondary | ICD-10-CM | POA: Diagnosis not present

## 2016-10-19 DIAGNOSIS — Z79899 Other long term (current) drug therapy: Secondary | ICD-10-CM | POA: Insufficient documentation

## 2016-10-19 DIAGNOSIS — I10 Essential (primary) hypertension: Secondary | ICD-10-CM | POA: Diagnosis not present

## 2016-10-19 LAB — BASIC METABOLIC PANEL
ANION GAP: 10 (ref 5–15)
BUN: 8 mg/dL (ref 6–20)
CHLORIDE: 102 mmol/L (ref 101–111)
CO2: 25 mmol/L (ref 22–32)
Calcium: 9.6 mg/dL (ref 8.9–10.3)
Creatinine, Ser: 0.98 mg/dL (ref 0.44–1.00)
GFR calc Af Amer: >60 mL/min (ref >60)
GLUCOSE: 180 mg/dL — AB (ref 65–99)
POTASSIUM: 3.7 mmol/L (ref 3.5–5.1)
Sodium: 137 mmol/L (ref 135–145)

## 2016-10-19 LAB — CBC
HEMATOCRIT: 37.5 % (ref 35.0–47.0)
HEMOGLOBIN: 12.5 g/dL (ref 12.0–16.0)
MCH: 29.9 pg (ref 26.0–34.0)
MCHC: 33.3 g/dL (ref 32.0–36.0)
MCV: 89.8 fL (ref 80.0–100.0)
Platelets: 398 10*3/uL (ref 150–440)
RBC: 4.18 MIL/uL (ref 3.80–5.20)
RDW: 13.7 % (ref 11.5–14.5)
WBC: 8.9 10*3/uL (ref 3.6–11.0)

## 2016-10-19 LAB — TROPONIN I: Troponin I: <0.03 ng/mL (ref <0.03)

## 2016-10-19 NOTE — ED Provider Notes (Signed)
[facility] Emergency Department Provider Note  ____________________________________________   First MD Initiated Contact with Patient 10/19/16 1504     (approximate)  I have reviewed the triage vital signs and the nursing notes.   HISTORY  Chief Complaint Chest Pain   HPI Alicia Rogers is a 56 y.o. female with a history of diabetes and schizophrenia who is presenting to the emergency department today with rib soreness that is been ongoing over the past several days. She says that the soreness hurts worse at night when she has more coughing. Says that over the past week she has had cough, dizziness, nausea vomiting and diarrhea. She says that she has had one episode of diarrhea today but no vomiting and was able to eat a boiled egg this morning. She denies any pain at this time and says the pain feels sharp especially when she coughs. She says the pain is to the anterior lower lids and radiates around to the back. Does not report any shortness of breath or chest pain with exertion.   Past Medical History:  Diagnosis Date  . Depression   . Diabetes mellitus without complication ([place])   . Diabetes mellitus, type II ([place])   . Hypertension   . Schizophrenia [facility])     Patient Active Problem List   Diagnosis Date Noted  . Compulsive tobacco user syndrome 10/11/2014  . BP (high blood pressure) 09/04/2014  . 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  . Claudication ([place]) 06/09/2014  . Schizoaffective disorder, depressive type ([place]) 06/09/2014  . Diabetes mellitus, type 2 ([place]) 06/09/2014  . Type 2 diabetes mellitus ([place]) 06/09/2014  . Controlled type 2 diabetes mellitus without complication ([place]) 12/87/8676    History reviewed. No pertinent surgical history.  Prior to Admission medications   Medication Sig Start Date End Date Taking? Authorizing Provider  ACCU-CHEK FASTCLIX LANCETS [place]  02/17/15   Historical Provider, MD    ACCU-CHEK SMARTVIEW test strip  04/02/15   Historical Provider, MD  amLODipine (NORVASC) 5 MG tablet Take 5 mg by mouth. 08/20/15 08/12/16  Historical Provider, MD  atorvastatin (LIPITOR) 40 MG tablet Take 40 mg by mouth daily at 6 PM.  08/20/15   Historical Provider, MD  Blood Glucose Monitoring Suppl (FIFTY50 GLUCOSE METER 2.0) W/DEVICE KIT Check blood sugar 3 times a day. Dx E11.9 07/04/14   Historical Provider, MD  clindamycin (CLEOCIN) 300 MG capsule Take 1 capsule (300 mg total) by mouth 3 (three) times daily. X 7 days Patient not taking: Reported on 06/18/2016 11/08/15   Duanne Guess, PA-C  glipiZIDE (GLUCOTROL XL) 10 MG 24 hr tablet Take 10 mg by mouth daily. 06/11/16 06/11/17  Historical Provider, MD  glipiZIDE (GLUCOTROL XL) 5 MG 24 hr tablet Take 5 mg by mouth. 01/20/15 01/20/16  Historical Provider, MD  Lancet Devices (RELION LANCING DEVICE) [place]  05/09/14   Historical Provider, MD  losartan-hydrochlorothiazide (HYZAAR) 50-12.5 MG tablet Take 1 tablet by mouth daily.  08/20/15   Historical Provider, MD  Lurasidone HCl 120 MG TABS Take 1 tablet (120 mg total) by mouth daily. With food 06/08/16   Himabindu Einar Grad, MD  meloxicam (MOBIC) 15 MG tablet Take 1 tablet (15 mg total) by mouth daily. 03/20/16   Arlyss Repress, PA-C  metFORMIN (GLUCOPHAGE) 1000 MG tablet Take 1,000 mg by mouth daily with breakfast.  08/20/15   Historical Provider, MD  traZODone (DESYREL) 150 MG tablet Take 1 tablet (150 mg total) by mouth at bedtime  as needed for sleep. 06/08/16   Himabindu Einar Grad, MD    Allergies Penicillins  Family History  Problem Relation Age of Onset  . Diabetes Mother   . Glaucoma Mother   . Heart disease Mother   . Heart attack Mother   . Depression Mother   . Hypertension Mother   . Diabetes Father   . Glaucoma Father   . Hypertension Father   . Diabetes Sister   . Depression Sister   . Post-traumatic stress disorder Sister     Social History Social History  Substance Use Topics  .  Smoking status: Current Every Day Smoker    Packs/day: 1.00    Types: Cigarettes    Start date: 04/21/1978  . Smokeless tobacco: Never Used  . Alcohol use No    Review of Systems Constitutional: No fever/chills Eyes: No visual changes. ENT: No sore throat. Cardiovascular: as above Respiratory: Denies shortness of breath. Gastrointestinal: No abdominal pain.  No constipation. Genitourinary: Negative for dysuria. Musculoskeletal: Negative for back pain. Skin: Negative for rash. Neurological: Negative for headaches, focal weakness or numbness.  10-point ROS otherwise negative.  ____________________________________________   PHYSICAL EXAM:  VITAL SIGNS: ED Triage Vitals  Enc Vitals Group     BP 10/19/16 1315 (!) 158/72     Pulse Rate 10/19/16 1315 78     Resp 10/19/16 1315 16     Temp 10/19/16 1315 98.2 F (36.8 C)     Temp Source 10/19/16 1315 Oral     SpO2 10/19/16 1315 100 %     Weight 10/19/16 1316 137 lb (62.1 kg)     Height 10/19/16 1316 _0  (1.575 m)     Head Circumference --      Peak Flow --      Pain Score 10/19/16 1316 10     Pain Loc --      Pain Edu? --      Excl. in [place]? --     Constitutional: Alert and oriented. Well appearing and in no acute distress. Eyes: Conjunctivae are normal. PERRL. EOMI. Head: Atraumatic. Nose: No congestion/rhinnorhea. Mouth/Throat: Mucous membranes are moist.   Neck: No stridor.   Cardiovascular: Normal rate, regular rhythm. Grossly normal heart sounds.  Chest pain is not reproducible on palpation. No crepitus or deformity visualized. Respiratory: Normal respiratory effort.  No retractions. Lungs CTAB. Gastrointestinal: Soft and nontender. No distention.  No CVA tenderness. Musculoskeletal: No lower extremity tenderness nor edema.  No joint effusions. Neurologic:  Normal speech and language. No gross focal neurologic deficits are appreciated. No gait instability. Skin:  Skin is warm, dry and intact. No rash  noted. Psychiatric: Mood and affect are normal. Speech and behavior are normal.  ____________________________________________   LABS (all labs ordered are listed, but only abnormal results are displayed)  Labs Reviewed  BASIC METABOLIC PANEL - Abnormal; Notable for the following:       Result Value   Glucose, Bld 180 (*)    All other components within normal limits  CBC  TROPONIN I   ____________________________________________  EKG  ED ECG REPORT I, Doran Stabler, the attending physician, personally viewed and interpreted this ECG.   Date: 10/19/2016  EKG Time: 1310  Rate: 77  Rhythm: normal sinus rhythm  Axis: Normal  Intervals:none  ST&T Change: T wave inversions in 2, 3 and aVF as well as V3 through V6. These T-wave inversions are unchanged from EKG of 04/23/2014.  ____________________________________________  RADIOLOGY  DG Chest 2 View (  Final result)  Result time 10/19/16 13:43:58  Final result by Kalman Jewels, MD (10/19/16 13:43:58)           Narrative:   CLINICAL DATA: Dizziness, weakness and shortness of breath.  EXAM: CHEST 2 VIEW  COMPARISON: None.  FINDINGS: The cardiac silhouette, mediastinal and hilar contours are normal. The lungs are clear. No pleural effusion. The bony thorax is intact.  IMPRESSION: No acute cardiopulmonary findings.   Electronically Signed By: Marijo Sanes M.D. On: 10/19/2016 13:43          ____________________________________________   PROCEDURES  Procedure(s) performed:   Procedures  Critical Care performed:   ____________________________________________   INITIAL IMPRESSION / ASSESSMENT AND PLAN / ED COURSE  Pertinent labs & imaging results that were available during my care of the patient were reviewed by me and considered in my medical decision making (see chart for details).  Patient with constellation of symptoms likely from a viral illness. I recommended the patient try  NyQuil for both pain and the coughing she is experiencing at night as well as a salve such as Aspercreme or icy hot. She is understanding of this plan and willing to comply. Will be discharged home. Very reassuring cardiac workup and no signs of pneumonia on the chest x-ray. I feel that supportive care is most appropriate in this case.      ____________________________________________   FINAL CLINICAL IMPRESSION(S) / ED DIAGNOSES  Chest wall pain.    NEW MEDICATIONS STARTED DURING THIS VISIT:  New Prescriptions   No medications on file     Note:  This document was prepared using Dragon voice recognition software and may include unintentional dictation errors.    Orbie Pyo, MD 10/19/16 [phone]

## 2016-10-19 NOTE — ED Triage Notes (Signed)
Pt to ed with c/o dizziness, weakness, sob, and cp intermittently over the last week.

## 2016-10-19 NOTE — ED Notes (Signed)
Pt discharged home after verbalizing understanding of discharge instructions; nad noted. 

## 2016-10-19 NOTE — ED Notes (Signed)
Pt reports she is having "rib pain" - she states she has been coughing and that is causing her ribs to hurt  - pt denies any other symptoms

## 2016-11-16 ENCOUNTER — Ambulatory Visit: Payer: Medicare HMO | Admitting: Psychiatry

## 2017-02-21 ENCOUNTER — Emergency Department
Admission: EM | Admit: 2017-02-21 | Discharge: 2017-02-21 | Disposition: A | Discharge status: Home / Self Care | Payer: Medicare HMO | Attending: Emergency Medicine | Admitting: Emergency Medicine

## 2017-02-21 ENCOUNTER — Encounter: Payer: Self-pay | Admitting: Emergency Medicine

## 2017-02-21 DIAGNOSIS — Z79899 Other long term (current) drug therapy: Secondary | ICD-10-CM | POA: Diagnosis not present

## 2017-02-21 DIAGNOSIS — Z046 Encounter for general psychiatric examination, requested by authority: Secondary | ICD-10-CM | POA: Diagnosis present

## 2017-02-21 DIAGNOSIS — F259 Schizoaffective disorder, unspecified: Secondary | ICD-10-CM | POA: Diagnosis not present

## 2017-02-21 DIAGNOSIS — Z7984 Long term (current) use of oral hypoglycemic drugs: Secondary | ICD-10-CM | POA: Insufficient documentation

## 2017-02-21 DIAGNOSIS — E119 Type 2 diabetes mellitus without complications: Secondary | ICD-10-CM | POA: Insufficient documentation

## 2017-02-21 DIAGNOSIS — F17210 Nicotine dependence, cigarettes, uncomplicated: Secondary | ICD-10-CM | POA: Insufficient documentation

## 2017-02-21 DIAGNOSIS — I10 Essential (primary) hypertension: Secondary | ICD-10-CM | POA: Insufficient documentation

## 2017-02-21 MED ORDER — LURASIDONE HCL 80 MG PO TABS: 80.0000 mg | ORAL_TABLET | Freq: Every day | ORAL | 3 refills | Status: DC

## 2017-02-21 NOTE — ED Provider Notes (Signed)
[facility] Emergency Department Provider Note  ____________________________________________   First MD Initiated Contact with Patient 02/21/17 1642     (approximate)  I have reviewed the triage vital signs and the nursing notes.   HISTORY  Chief Complaint Psychiatric Evaluation    HPI Alicia Rogers is a 56 y.o. female with a history of schizoaffective disorder who presents by EMS from [facility] primary care for evaluation of depression and being off of her medications.  She reports that she takes with a 2 to and Geodon but she cannot remember the doses.  She states that she ran out 2 days ago and needs to have them refilled.  She went to her regular doctor today to discuss this but she was sent here because she reports that her depression has been gradually worsening over time.  She feels like she would like to come into the hospital for about a week to get feeling better.  However she denies suicidal ideation and homicidal ideation and just states she would like to feel better.  She denies any acute medical conditions, specifically including fever/chills, chest pain, shortness of breath, nausea, vomiting, abdominal pain, dysuria.  She is not hearing any voices at this time.  Nothing in particular makes the patient's symptoms better nor worse.     Past Medical History:  Diagnosis Date  . Depression   . Diabetes mellitus without complication ([place])   . Diabetes mellitus, type II ([place])   . Hypertension   . Schizophrenia [facility])     Patient Active Problem List   Diagnosis Date Noted  . Compulsive tobacco user syndrome 10/11/2014  . BP (high blood pressure) 09/04/2014  . 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  . Claudication ([place]) 06/09/2014  . Schizoaffective disorder, depressive type ([place]) 06/09/2014  . Diabetes mellitus, type 2 ([place]) 06/09/2014  . Type 2 diabetes mellitus ([place]) 06/09/2014  . Controlled type 2 diabetes mellitus  without complication ([place]) 67/67/2094    History reviewed. No pertinent surgical history.  Prior to Admission medications   Medication Sig Start Date End Date Taking? Authorizing Provider  atorvastatin (LIPITOR) 80 MG tablet Take 40 mg by mouth daily at 6 PM.  08/20/15  Yes [provider]  glipiZIDE (GLUCOTROL XL) 10 MG 24 hr tablet Take 10 mg by mouth daily. 06/11/16 06/11/17 Yes [provider]  losartan (COZAAR) 100 MG tablet Take 100 mg by mouth daily.   Yes [provider]  oxybutynin (DITROPAN) 5 MG tablet Take 5 mg by mouth 3 (three) times daily. 01/27/17 01/27/18 Yes [provider]  sitaGLIPtin (JANUVIA) 100 MG tablet Take 100 mg by mouth daily.   Yes [provider]  ACCU-CHEK FASTCLIX LANCETS [place]  02/17/15   [provider]  ACCU-CHEK SMARTVIEW test strip  04/02/15   [provider]  Blood Glucose Monitoring Suppl (FIFTY50 GLUCOSE METER 2.0) W/DEVICE KIT Check blood sugar 3 times a day. Dx E11.9 07/04/14   [provider]  clindamycin (CLEOCIN) 300 MG capsule Take 1 capsule (300 mg total) by mouth 3 (three) times daily. X 7 days Patient not taking: Reported on 06/18/2016 11/08/15   Duanne Guess, PA-C  Lancet Devices (RELION LANCING DEVICE) [place]  05/09/14   [provider]  lurasidone (LATUDA) 80 MG TABS tablet Take 1 tablet (80 mg total) by mouth daily. 02/21/17   Hinda Kehr, MD  meloxicam (MOBIC) 15 MG tablet Take 1 tablet (15 mg total) by mouth daily. Patient not  taking: Reported on 02/21/2017 03/20/16   Arlyss Repress, PA-C  metFORMIN (GLUCOPHAGE) 1000 MG tablet Take 1,000 mg by mouth daily with breakfast.  08/20/15   [provider]  traZODone (DESYREL) 150 MG tablet Take 1 tablet (150 mg total) by mouth at bedtime as needed for sleep. Patient not taking: Reported on 02/21/2017 06/08/16   Elvin So, MD    Allergies Penicillins  Family History  Problem Relation Age of Onset  .  Diabetes Mother   . Glaucoma Mother   . Heart disease Mother   . Heart attack Mother   . Depression Mother   . Hypertension Mother   . Diabetes Father   . Glaucoma Father   . Hypertension Father   . Diabetes Sister   . Depression Sister   . Post-traumatic stress disorder Sister     Social History Social History  Substance Use Topics  . Smoking status: Current Every Day Smoker    Packs/day: 1.00    Types: Cigarettes    Start date: 04/21/1978  . Smokeless tobacco: Never Used  . Alcohol use No    Review of Systems Constitutional: No fever/chills Eyes: No visual changes. ENT: No sore throat. Cardiovascular: Denies chest pain. Respiratory: Denies shortness of breath. Gastrointestinal: No abdominal pain.  No nausea, no vomiting.  No diarrhea.  No constipation. Genitourinary: Negative for dysuria. Musculoskeletal: Negative for neck pain.  Negative for back pain. Integumentary: Negative for rash. Neurological: Negative for headaches, focal weakness or numbness. Psychiatric:Gradually worsening depression over time, but denies auditory and visual hallucinations and denies SI/HI  ____________________________________________   PHYSICAL EXAM:  VITAL SIGNS: ED Triage Vitals  Enc Vitals Group     BP 02/21/17 1614 (!) 186/85     Pulse Rate 02/21/17 1614 91     Resp 02/21/17 1614 18     Temp --      Temp Source 02/21/17 1614 Oral     SpO2 02/21/17 1614 97 %     Weight 02/21/17 1615 54.9 kg (121 lb)     Height 02/21/17 1615 1.6 m (_0 )     Head Circumference --      Peak Flow --      Pain Score --      Pain Loc --      Pain Edu? --      Excl. in [place]? --     Constitutional: Alert and oriented. Well appearing and in no acute distress. Eyes: Conjunctivae are normal.  Head: Atraumatic. Nose: No congestion/rhinnorhea. Mouth/Throat: Mucous membranes are moist. Neck: No stridor.  No meningeal signs.   Cardiovascular: Normal rate, regular rhythm. Good peripheral  circulation. Grossly normal heart sounds. Respiratory: Normal respiratory effort.  No retractions. Lungs CTAB. Gastrointestinal: Soft and nontender. No distention.  Musculoskeletal: No lower extremity tenderness nor edema. No gross deformities of extremities. Neurologic:  Normal speech and language. No gross focal neurologic deficits are appreciated.  Skin:  Skin is warm, dry and intact. No rash noted. Psychiatric: Mood and affect are normal. Speech and behavior are normal. Good insight and judgment into her condition.  Denies SI/HI.    ____________________________________________   LABS (all labs ordered are listed, but only abnormal results are displayed)  Labs Reviewed - No data to display ____________________________________________  EKG  None - EKG not ordered by ED physician ____________________________________________  RADIOLOGY   No results found.  ____________________________________________   PROCEDURES  Critical Care performed: No   Procedure(s) performed:   Procedures   ____________________________________________  INITIAL IMPRESSION / ASSESSMENT AND PLAN / ED COURSE  Pertinent labs & imaging results that were available during my care of the patient were reviewed by me and considered in my medical decision making (see chart for details).  The patient does not meet involuntary commitment criteria.  Based on her good insight and judgment as well as her lack of suicidal ideation and homicidal ideation, she also does not meet inpatient criteria at this time.  I talked with her about getting a specialist on-call consultation but explained that it is very unlikely that they would bring her into the hospital given that she is very stable and calm right now.  I asked her if she would be comfortable speaking with TTS and me, and then I could refill her prescriptions and she could follow-up as an outpatient.  She agrees with this plan and feels like that would work  out well.  I will discuss with TTS and then see about refilling her medications.   Clinical Course as of Feb 22 1955  Mon Feb 21, 2017  1650 Calvin with behavioral health to see and discuss plan with patient.  [CF]  6440 We are attempting to reconcile the patient's medications as there is some conflicting information regarding her dosage.  Once we have that information we will discharge her home and she is comfortable with the plan.  Calvin evaluated her and agrees with my assessment that she does not meet inpatient criteria nor did she mean involuntary commitment criteria.  The patient is calm and cooperative and awaiting her disposition plan  [CF]    Clinical Course User Index [CF] Hinda Kehr, MD    ____________________________________________  FINAL CLINICAL IMPRESSION(S) / ED DIAGNOSES  Final diagnoses:  Schizoaffective disorder, unspecified type ([place])     MEDICATIONS GIVEN DURING THIS VISIT:  Medications - No data to display   NEW OUTPATIENT MEDICATIONS STARTED DURING THIS VISIT:  Current Discharge Medication List      Current Discharge Medication List    CONTINUE these medications which have CHANGED   Details  lurasidone (LATUDA) 80 MG TABS tablet Take 1 tablet (80 mg total) by mouth daily. Qty: 30 tablet, Refills: 3        Current Discharge Medication List       Note:  This document was prepared using Dragon voice recognition software and may include unintentional dictation errors.    Hinda Kehr, MD 02/21/17 [phone]

## 2017-02-21 NOTE — ED Notes (Signed)
Pt visualized in NAD at this time. Pt sitting up in bed. Denies any needs. Offered patient food. Pt refused at this time.

## 2017-02-21 NOTE — ED Triage Notes (Signed)
Pt presents to ED via [facility] from [facility] Per EMS pt has hx of schizophrenia, depression, HTN, and DM. EMS reports pt has been off medication x 2 days, pt reports she ran out of medications at home. Denies SI/HI. Pt states she does not feel good and would like to be voluntarily committed, pt denies any A/V hallucinations, is A&O x 4 upon arrival.

## 2017-02-21 NOTE — ED Notes (Signed)
Explained delay to patient and that we were waiting for pharmacy to verify doses of patient's home medications prior to her being discharged. Pt states understanding at this time. Once again refuses when food is offered. Will continue to monitor for further patient needs.

## 2017-02-21 NOTE — Progress Notes (Signed)
CSW engaged with Patient at her bedside. CSW introduced self and role of CSW. Patient reports that she currently lives with her nephew but is interested in pursuing group home placement. Patient reports that she used to live in a group home at some point. Patient reports that ideally she would like to live independently. CSW discussed with Patient the process of going to a group home including need for a TB Skin Test and FL2 all of which Patient can get from her PCP should she decide to move forward with placement. CSW provided Patient with list of adult care homes and group home facilities. Patient has been medically and psychiatrically cleared and is agreeable to follow up with her primary care physician and psychiatric care as arranged by TTS. CSW signing off at this time. Please consult should new need(s) arise.    Lorrine Kin, MSW, [place] Clinical Social Worker [phone]

## 2017-02-21 NOTE — ED Notes (Signed)
TTS at bedside at this time.  

## 2017-02-21 NOTE — BH Assessment (Signed)
Per the request of the ER MD (Dr. Karma Greaser), writer spoke with the patient. Patient states she came to the ER after been seen at her PCP's office. It was her decision to come to the ER via EMS.  Patient states she is dealing with depression. She denies SI/HI and V/H. She reports of having A/H but it's ongoing due to having Schizophrenia. She no longer have any medications to treat her schizophrenia. She ran out approximately two days ago. She haven't seen her Psych MD since April 2018. She missed three appointments because she didn't have a ride, thus she was discharged. Her PCP was prescribing the medications in the mean time.  Patient also requested information in regards to [place]. She currently lives with her nephew. She states things are going well but she want to live in a [place]. She states she have live in one before. Writer contacted ER Social Worker Caryl Pina) an informed her of patient's request. She seen the patient.  Writer contacted patient's insurance provider, [employer] ([phone]) to get a list of Psychiatric Providers that are in her Network. Information provided to the patient (See referrals at the bottom).    Writer updated the ER MD (Dr. Karma Greaser) of the conversation.   Refers: Psychiatric Providers within your [facility] [phone]  [facility] in [place] [phone]  [facility] [phone]  [phone]  [facility] [phone]

## 2017-02-21 NOTE — ED Notes (Signed)
NAD noted at time of D/C. Pt denies questions or concerns. Pt ambulatory to the lobby at this time. MD aware of patient's BP at time of D/C. Pt instructed to take BP medication when she gets home. Pt states understanding at this time.

## 2017-02-21 NOTE — Discharge Instructions (Signed)
You have been seen in the Emergency Department (ED) today for a psychiatric complaint.  You have been evaluated and we believe you are safe to be discharged from the hospital.  Your medications were refilled.  Please take them (and all your other medications) as prescribed.  Please return to the ED immediately if you have ANY thoughts of hurting yourself or anyone else, so that we may help you.  Please avoid alcohol and drug use.  Follow up with your doctor and/or therapist as soon as possible regarding today's ED visit.   Please follow up any other recommendations and clinic appointments provided by the psychiatry team that saw you in the Emergency Department.

## 2017-03-28 ENCOUNTER — Other Ambulatory Visit: Payer: Self-pay | Admitting: Psychiatry

## 2017-05-26 DIAGNOSIS — M545 Low back pain, unspecified: Secondary | ICD-10-CM | POA: Insufficient documentation

## 2017-08-26 DIAGNOSIS — N184 Chronic kidney disease, stage 4 (severe): Secondary | ICD-10-CM | POA: Insufficient documentation

## 2017-09-21 DIAGNOSIS — R350 Frequency of micturition: Secondary | ICD-10-CM | POA: Insufficient documentation

## 2018-03-08 DIAGNOSIS — L2489 Irritant contact dermatitis due to other agents: Secondary | ICD-10-CM | POA: Insufficient documentation

## 2018-08-09 ENCOUNTER — Other Ambulatory Visit: Payer: Self-pay | Admitting: Family Medicine

## 2018-08-09 DIAGNOSIS — Z1231 Encounter for screening mammogram for malignant neoplasm of breast: Secondary | ICD-10-CM

## 2018-08-29 ENCOUNTER — Ambulatory Visit
Admission: RE | Admit: 2018-08-29 | Discharge: 2018-08-29 | Disposition: A | Discharge status: Home / Self Care | Payer: Medicare HMO | Source: Ambulatory Visit | Attending: Family Medicine | Admitting: Family Medicine

## 2018-08-29 DIAGNOSIS — Z1231 Encounter for screening mammogram for malignant neoplasm of breast: Secondary | ICD-10-CM | POA: Diagnosis not present

## 2018-09-21 ENCOUNTER — Ambulatory Visit
Admission: RE | Admit: 2018-09-21 | Discharge: 2018-09-21 | Disposition: A | Discharge status: Home / Self Care | Payer: Medicare HMO | Source: Ambulatory Visit | Attending: Family Medicine | Admitting: Family Medicine

## 2018-09-21 ENCOUNTER — Ambulatory Visit
Admission: RE | Admit: 2018-09-21 | Discharge: 2018-09-21 | Disposition: A | Discharge status: Home / Self Care | Payer: Medicare HMO | Attending: Family Medicine | Admitting: Family Medicine

## 2018-09-21 ENCOUNTER — Other Ambulatory Visit: Payer: Self-pay | Admitting: Family Medicine

## 2018-09-21 DIAGNOSIS — M79672 Pain in left foot: Secondary | ICD-10-CM

## 2019-06-12 ENCOUNTER — Other Ambulatory Visit: Payer: Self-pay

## 2019-06-12 ENCOUNTER — Emergency Department: Payer: Medicare HMO

## 2019-06-12 ENCOUNTER — Encounter: Payer: Self-pay | Admitting: Emergency Medicine

## 2019-06-12 ENCOUNTER — Emergency Department
Admission: EM | Admit: 2019-06-12 | Discharge: 2019-06-12 | Disposition: A | Discharge status: Home / Self Care | Payer: Medicare HMO | Attending: Student in an Organized Health Care Education/Training Program | Admitting: Student in an Organized Health Care Education/Training Program

## 2019-06-12 DIAGNOSIS — I10 Essential (primary) hypertension: Secondary | ICD-10-CM | POA: Insufficient documentation

## 2019-06-12 DIAGNOSIS — Z79899 Other long term (current) drug therapy: Secondary | ICD-10-CM | POA: Insufficient documentation

## 2019-06-12 DIAGNOSIS — F17210 Nicotine dependence, cigarettes, uncomplicated: Secondary | ICD-10-CM | POA: Insufficient documentation

## 2019-06-12 DIAGNOSIS — M25512 Pain in left shoulder: Secondary | ICD-10-CM | POA: Insufficient documentation

## 2019-06-12 DIAGNOSIS — M79601 Pain in right arm: Secondary | ICD-10-CM

## 2019-06-12 DIAGNOSIS — E119 Type 2 diabetes mellitus without complications: Secondary | ICD-10-CM | POA: Diagnosis not present

## 2019-06-12 DIAGNOSIS — Z7984 Long term (current) use of oral hypoglycemic drugs: Secondary | ICD-10-CM | POA: Insufficient documentation

## 2019-06-12 LAB — CBC WITH DIFFERENTIAL/PLATELET
Abs Immature Granulocytes: 0.04 10*3/uL (ref 0.00–0.07)
Basophils Absolute: 0 10*3/uL (ref 0.0–0.1)
Basophils Relative: 1 %
Eosinophils Absolute: 0 10*3/uL (ref 0.0–0.5)
Eosinophils Relative: 0 %
HCT: 41.6 % (ref 36.0–46.0)
Hemoglobin: 13.2 g/dL (ref 12.0–15.0)
Immature Granulocytes: 1 %
Lymphocytes Relative: 19 %
Lymphs Abs: 1.7 10*3/uL (ref 0.7–4.0)
MCH: 28.6 pg (ref 26.0–34.0)
MCHC: 31.7 g/dL (ref 30.0–36.0)
MCV: 90 fL (ref 80.0–100.0)
Monocytes Absolute: 0.7 10*3/uL (ref 0.1–1.0)
Monocytes Relative: 8 %
Neutro Abs: 6.3 10*3/uL (ref 1.7–7.7)
Neutrophils Relative %: 71 %
Platelets: 412 10*3/uL — ABNORMAL HIGH (ref 150–400)
RBC: 4.62 MIL/uL (ref 3.87–5.11)
RDW: 14.5 % (ref 11.5–15.5)
WBC: 8.7 10*3/uL (ref 4.0–10.5)
nRBC: 0 % (ref 0.0–0.2)

## 2019-06-12 LAB — COMPREHENSIVE METABOLIC PANEL
ALT: 13 U/L (ref 0–44)
AST: 19 U/L (ref 15–41)
Albumin: 4.1 g/dL (ref 3.5–5.0)
Alkaline Phosphatase: 87 U/L (ref 38–126)
Anion gap: 9 (ref 5–15)
BUN: 20 mg/dL (ref 6–20)
CO2: 27 mmol/L (ref 22–32)
Calcium: 10.2 mg/dL (ref 8.9–10.3)
Chloride: 103 mmol/L (ref 98–111)
Creatinine, Ser: 1.28 mg/dL — ABNORMAL HIGH (ref 0.44–1.00)
GFR calc Af Amer: 53 mL/min — ABNORMAL LOW (ref >60)
GFR calc non Af Amer: 46 mL/min — ABNORMAL LOW (ref >60)
Glucose, Bld: 168 mg/dL — ABNORMAL HIGH (ref 70–99)
Potassium: 4.7 mmol/L (ref 3.5–5.1)
Sodium: 139 mmol/L (ref 135–145)
Total Bilirubin: 1.1 mg/dL (ref 0.3–1.2)
Total Protein: 8.8 g/dL — ABNORMAL HIGH (ref 6.5–8.1)

## 2019-06-12 LAB — TROPONIN I (HIGH SENSITIVITY)
Troponin I (High Sensitivity): 10 ng/L (ref <18)
Troponin I (High Sensitivity): 10 ng/L (ref <18)

## 2019-06-12 MED ORDER — ACETAMINOPHEN 500 MG PO TABS
1000.0000 mg | ORAL_TABLET | Freq: Once | ORAL | Status: AC
  Administered 2019-06-12: 16:00:00 1000 mg via ORAL
  Filled 2019-06-12: qty 2

## 2019-06-12 MED ORDER — LOSARTAN POTASSIUM 50 MG PO TABS
50.0000 mg | ORAL_TABLET | Freq: Every day | ORAL | Status: DC
  Administered 2019-06-12: 50 mg via ORAL
  Filled 2019-06-12: qty 1

## 2019-06-12 NOTE — ED Triage Notes (Signed)
PT c/o RT shoulder pain since this am upon awakening. PT in NAD. Decreased ROM . Denies injury

## 2019-06-12 NOTE — ED Provider Notes (Signed)
[facility] Emergency Department Provider Note    First MD Initiated Contact with Patient 06/12/19 1416     (approximate)  I have reviewed the triage vital signs and the nursing notes.   HISTORY  Chief Complaint No chief complaint on file.    HPI Alicia Rogers is a 58 y.o. female presents the ER for evaluation of right-sided achy shoulder and arm pain.  States that symptoms started this morning she woke up.  Denies any focal trauma.  States the pain has subsided.  No fevers.  Was also concerned her blood pressure is elevated.  Denies any shortness of breath.  No numbness or tingling.  States that she is been compliant with her medications.  She is requesting something to eat.   Past Medical History:  Diagnosis Date  . Depression   . Diabetes mellitus without complication ([place])   . Diabetes mellitus, type II ([place])   . Hypertension   . Schizophrenia ([place])    Family History  Problem Relation Age of Onset  . Diabetes Mother   . Glaucoma Mother   . Heart disease Mother   . Heart attack Mother   . Depression Mother   . Hypertension Mother   . Diabetes Father   . Glaucoma Father   . Hypertension Father   . Diabetes Sister   . Depression Sister   . Post-traumatic stress disorder Sister    History reviewed. No pertinent surgical history. Patient Active Problem List   Diagnosis Date Noted  . Compulsive tobacco user syndrome 10/11/2014  . BP (high blood pressure) 09/04/2014  . 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  . Claudication ([place]) 06/09/2014  . Schizoaffective disorder, depressive type ([place]) 06/09/2014  . Diabetes mellitus, type 2 ([place]) 06/09/2014  . Type 2 diabetes mellitus ([place]) 06/09/2014  . Controlled type 2 diabetes mellitus without complication ([place]) 16/05/9603      Prior to Admission medications   Medication Sig Start Date End Date Taking? Authorizing Provider  ACCU-CHEK FASTCLIX LANCETS [place]  02/17/15    [provider]  ACCU-CHEK SMARTVIEW test strip  04/02/15   [provider]  atorvastatin (LIPITOR) 80 MG tablet Take 40 mg by mouth daily at 6 PM.  08/20/15   [provider]  Blood Glucose Monitoring Suppl (FIFTY50 GLUCOSE METER 2.0) W/DEVICE KIT Check blood sugar 3 times a day. Dx E11.9 07/04/14   [provider]  clindamycin (CLEOCIN) 300 MG capsule Take 1 capsule (300 mg total) by mouth 3 (three) times daily. X 7 days Patient not taking: Reported on 06/18/2016 11/08/15   Duanne Guess, PA-C  glipiZIDE (GLUCOTROL XL) 10 MG 24 hr tablet Take 10 mg by mouth daily. 06/11/16 06/11/17  [provider]  Lancet Devices (RELION LANCING DEVICE) [place]  05/09/14   [provider]  losartan (COZAAR) 100 MG tablet Take 100 mg by mouth daily.    [provider]  lurasidone (LATUDA) 80 MG TABS tablet Take 1 tablet (80 mg total) by mouth daily. 02/21/17   Hinda Kehr, MD  meloxicam (MOBIC) 15 MG tablet Take 1 tablet (15 mg total) by mouth daily. Patient not taking: Reported on 02/21/2017 03/20/16   Arlyss Repress, PA-C  metFORMIN (GLUCOPHAGE) 1000 MG tablet Take 1,000 mg by mouth daily with breakfast.  08/20/15   [provider]  sitaGLIPtin (JANUVIA) 100 MG tablet Take 100 mg by mouth daily.    [provider]  traZODone (DESYREL) 150 MG tablet Take  1 tablet (150 mg total) by mouth at bedtime as needed for sleep. Patient not taking: Reported on 02/21/2017 06/08/16   Elvin So, MD    Allergies Penicillins    Social History Social History   Tobacco Use  . Smoking status: Current Every Day Smoker    Packs/day: 1.00    Types: Cigarettes    Start date: 04/21/1978  . Smokeless tobacco: Never Used  Substance Use Topics  . Alcohol use: No    Alcohol/week: 0.0 standard drinks  . Drug use: No    Review of Systems Patient denies headaches, rhinorrhea, blurry vision, numbness, shortness of breath, chest pain, edema,  cough, abdominal pain, nausea, vomiting, diarrhea, dysuria, fevers, rashes or hallucinations unless otherwise stated above in HPI. ____________________________________________   PHYSICAL EXAM:  VITAL SIGNS: Vitals:   06/12/19 1311 06/12/19 1600  BP: (!) 200/79 (!) 190/50  Pulse: 85 88  Resp: 18 18  Temp: 99.2 F (37.3 C)   SpO2: 97% 100%    Constitutional: Alert and oriented.  Eyes: Conjunctivae are normal.  Head: Atraumatic. Nose: No congestion/rhinnorhea. Mouth/Throat: Mucous membranes are moist.   Neck: No stridor. Painless ROM.  Cardiovascular: Normal rate, regular rhythm. Grossly normal heart sounds.  Good peripheral circulation. Respiratory: Normal respiratory effort.  No retractions. Lungs CTAB. Gastrointestinal: Soft and nontender. No distention. No abdominal bruits. No CVA tenderness. Genitourinary:  Musculoskeletal: No lower extremity tenderness nor edema.  No joint effusions. Neurologic:  Normal speech and language. No gross focal neurologic deficits are appreciated. No facial droop Skin:  Skin is warm, dry and intact. No rash noted. Psychiatric: Mood and affect are normal. Speech and behavior are normal.  ____________________________________________   LABS (all labs ordered are listed, but only abnormal results are displayed)  Results for orders placed or performed during the hospital encounter of 06/12/19 (from the past 24 hour(s))  Troponin I (High Sensitivity)     Status: None   Collection Time: 06/12/19  3:41 PM  Result Value Ref Range   Troponin I (High Sensitivity) 10 <18 ng/L  CBC with Differential     Status: Abnormal   Collection Time: 06/12/19  3:41 PM  Result Value Ref Range   WBC 8.7 4.0 - 10.5 K/uL   RBC 4.62 3.87 - 5.11 MIL/uL   Hemoglobin 13.2 12.0 - 15.0 g/dL   HCT 41.6 36.0 - 46.0 %   MCV 90.0 80.0 - 100.0 fL   MCH 28.6 26.0 - 34.0 pg   MCHC 31.7 30.0 - 36.0 g/dL   RDW 14.5 11.5 - 15.5 %   Platelets 412 (H) 150 - 400 K/uL   nRBC 0.0  0.0 - 0.2 %   Neutrophils Relative % 71 %   Neutro Abs 6.3 1.7 - 7.7 K/uL   Lymphocytes Relative 19 %   Lymphs Abs 1.7 0.7 - 4.0 K/uL   Monocytes Relative 8 %   Monocytes Absolute 0.7 0.1 - 1.0 K/uL   Eosinophils Relative 0 %   Eosinophils Absolute 0.0 0.0 - 0.5 K/uL   Basophils Relative 1 %   Basophils Absolute 0.0 0.0 - 0.1 K/uL   Immature Granulocytes 1 %   Abs Immature Granulocytes 0.04 0.00 - 0.07 K/uL  Comprehensive metabolic panel     Status: Abnormal   Collection Time: 06/12/19  3:41 PM  Result Value Ref Range   Sodium 139 135 - 145 mmol/L   Potassium 4.7 3.5 - 5.1 mmol/L   Chloride 103 98 - 111 mmol/L   CO2 27  22 - 32 mmol/L   Glucose, Bld 168 (H) 70 - 99 mg/dL   BUN 20 6 - 20 mg/dL   Creatinine, Ser 1.28 (H) 0.44 - 1.00 mg/dL   Calcium 10.2 8.9 - 10.3 mg/dL   Total Protein 8.8 (H) 6.5 - 8.1 g/dL   Albumin 4.1 3.5 - 5.0 g/dL   AST 19 15 - 41 U/L   ALT 13 0 - 44 U/L   Alkaline Phosphatase 87 38 - 126 U/L   Total Bilirubin 1.1 0.3 - 1.2 mg/dL   GFR calc non Af Amer 46 (L) >60 mL/min   GFR calc Af Amer 53 (L) >60 mL/min   Anion gap 9 5 - 15  Troponin I (High Sensitivity)     Status: None   Collection Time: 06/12/19  5:00 PM  Result Value Ref Range   Troponin I (High Sensitivity) 10 <18 ng/L   ____________________________________________  EKG My review and personal interpretation at Time:   15:38 Indication: right arm pain  Rate: 64  Rhythm: sinus Axis: normal Other: normal intervals, no stemi, inferolateral t wave inversions are unchanged from previous ____________________________________________  RADIOLOGY  I personally reviewed all radiographic images ordered to evaluate for the above acute complaints and reviewed radiology reports and findings.  These findings were personally discussed with the patient.  Please see medical record for radiology report.  ____________________________________________   PROCEDURES  Procedure(s) performed:  Procedures     Critical Care performed: no ____________________________________________   INITIAL IMPRESSION / ASSESSMENT AND PLAN / ED COURSE  Pertinent labs & imaging results that were available during my care of the patient were reviewed by me and considered in my medical decision making (see chart for details).   DDX: ACS, pericarditis, esophagitis, boerhaaves, pe, dissection, pna, bronchitis, costochondritis   Aylah S Weisensel is a 58 y.o. who presents to the ED with right arm pain as described above.  Patient also noted to be hypertensive.  Not having any back pain certainly no report of any pain ripping or tearing through to her back.  Not having shortness of breath.  Given her age risk factors and patient somewhat of a poor historian will order serial enzymes.  Does not seem consistent with PE or dissection.  Likely arthritis.  Not consistent with septic arthritis.  Clinical Course as of Jun 12 1743  Tue Jun 12, 2019  1743 Serial enzymes negative.  Patient remains hemodynamically stable and pain-free.  I do believe pain is most likely secondary to arthritis or musculoskeletal pain she is appropriate for outpatient follow-up.   [PR]    Clinical Course User Index [PR] Merlyn Lot, MD    The patient was evaluated in Emergency Department today for the symptoms described in the history of present illness. He/she was evaluated in the context of the global COVID-19 pandemic, which necessitated consideration that the patient might be at risk for infection with the SARS-CoV-2 virus that causes COVID-19. Institutional protocols and algorithms that pertain to the evaluation of patients at risk for COVID-19 are in a state of rapid change based on information released by regulatory bodies including the CDC and federal and state organizations. These policies and algorithms were followed during the patient's care in the ED.  As part of my medical decision making, I reviewed the following data within the  [facility] notes reviewed and incorporated, Labs reviewed, notes from prior ED visits and [place] Controlled Substance Database   ____________________________________________   FINAL CLINICAL IMPRESSION(S) /  ED DIAGNOSES  Final diagnoses:  Right arm pain      NEW MEDICATIONS STARTED DURING THIS VISIT:  New Prescriptions   No medications on file     Note:  This document was prepared using Dragon voice recognition software and may include unintentional dictation errors.    Merlyn Lot, MD 06/12/19 [phone]

## 2019-06-12 NOTE — ED Provider Notes (Signed)
[facility] Emergency Department Provider Note  ____________________________________________   None    (approximate)  I have reviewed the triage vital signs and the nursing notes.   Patient has been triaged with a MSE exam performed by myself at a minimum. Based on symptoms and screening exam, patient may receive a more in-depth exam, labs, imaging as detailed below. Patients have been advised of this setting and exam type at the time of patient interview.   HISTORY  Chief Complaint No chief complaint on file.  HPI Alicia Rogers is a 58 y.o. female presents to the emergency department with a complaint of left shoulder pain upon awakening. She admits to sleeping on her flexed left arm. She denies fevers, chills, sweats, or chest pain.   Patient will receive a medical screening exam as detailed below.  Based off of this exam, more in depth exam, labs, imaging will be performed as needed for complaint.  Patient care will be eventually transferred to another provider in the emergency department for final exam, diagnosis and disposition.   Past Medical History:  Diagnosis Date  . Depression   . Diabetes mellitus without complication ([place])   . Diabetes mellitus, type II ([place])   . Hypertension   . Schizophrenia [facility])     Patient Active Problem List   Diagnosis Date Noted  . Compulsive tobacco user syndrome 10/11/2014  . BP (high blood pressure) 09/04/2014  . 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  . Claudication ([place]) 06/09/2014  . Schizoaffective disorder, depressive type ([place]) 06/09/2014  . Diabetes mellitus, type 2 ([place]) 06/09/2014  . Type 2 diabetes mellitus ([place]) 06/09/2014  . Controlled type 2 diabetes mellitus without complication ([place]) 35/45/6256    History reviewed. No pertinent surgical history.  Prior to Admission medications   Medication Sig Start Date End Date Taking? Authorizing Provider  ACCU-CHEK FASTCLIX  LANCETS [place]  02/17/15   [provider]  ACCU-CHEK SMARTVIEW test strip  04/02/15   [provider]  atorvastatin (LIPITOR) 80 MG tablet Take 40 mg by mouth daily at 6 PM.  08/20/15   [provider]  Blood Glucose Monitoring Suppl (FIFTY50 GLUCOSE METER 2.0) W/DEVICE KIT Check blood sugar 3 times a day. Dx E11.9 07/04/14   [provider]  clindamycin (CLEOCIN) 300 MG capsule Take 1 capsule (300 mg total) by mouth 3 (three) times daily. X 7 days Patient not taking: Reported on 06/18/2016 11/08/15   Duanne Guess, PA-C  glipiZIDE (GLUCOTROL XL) 10 MG 24 hr tablet Take 10 mg by mouth daily. 06/11/16 06/11/17  [provider]  Lancet Devices (RELION LANCING DEVICE) [place]  05/09/14   [provider]  losartan (COZAAR) 100 MG tablet Take 100 mg by mouth daily.    [provider]  lurasidone (LATUDA) 80 MG TABS tablet Take 1 tablet (80 mg total) by mouth daily. 02/21/17   Hinda Kehr, MD  meloxicam (MOBIC) 15 MG tablet Take 1 tablet (15 mg total) by mouth daily. Patient not taking: Reported on 02/21/2017 03/20/16   Arlyss Repress, PA-C  metFORMIN (GLUCOPHAGE) 1000 MG tablet Take 1,000 mg by mouth daily with breakfast.  08/20/15   [provider]  sitaGLIPtin (JANUVIA) 100 MG tablet Take 100 mg by mouth daily.    [provider]  traZODone (DESYREL) 150 MG tablet Take 1 tablet (150 mg total) by mouth at bedtime as needed for sleep. Patient not taking: Reported on 02/21/2017 06/08/16   Ravi, Himabindu,  MD    Allergies Penicillins  Family History  Problem Relation Age of Onset  . Diabetes Mother   . Glaucoma Mother   . Heart disease Mother   . Heart attack Mother   . Depression Mother   . Hypertension Mother   . Diabetes Father   . Glaucoma Father   . Hypertension Father   . Diabetes Sister   . Depression Sister   . Post-traumatic stress disorder Sister     Social History Social History   Tobacco Use  .  Smoking status: Current Every Day Smoker    Packs/day: 1.00    Types: Cigarettes    Start date: 04/21/1978  . Smokeless tobacco: Never Used  Substance Use Topics  . Alcohol use: No    Alcohol/week: 0.0 standard drinks  . Drug use: No    Review of Systems Constitutional: denies fever ENT: Denies nasal congestion/rhinorhea. Denies sore throat Cardiovascular: Denies chest pain. Respiratory: Denies cough. Denies shortness of breath/difficulty breathing Gastroenterology: Denies abdominal pain Musculoskeletal: Positive for musculoskeletal pain Integumentary: Negative for rash. Neurological: No focal weakness or numbness.  ____________________________________________   PHYSICAL EXAM:  VITAL SIGNS: ED Triage Vitals [06/12/19 1311]  Enc Vitals Group     BP (!) 200/79     Pulse Rate 85     Resp 18     Temp 99.2 F (37.3 C)     Temp Source Oral     SpO2 97 %     Weight 127 lb (57.6 kg)     Height '5\' 3"'  (1.6 m)     Head Circumference      Peak Flow      Pain Score      Pain Loc      Pain Edu?      Excl. in [place]?     Constitutional: Alert and oriented. Generally well appearing and in no acute distress. Eyes: Conjunctivae are normal.  Neck: No stridor.  No meningeal signs.   Cardiovascular: Grossly normal heart sounds. Respiratory: Normal respiratory effort without significant tachypnea and no observed retractions. Lungs CTA Gastrointestinal: No significant visible abdominal wall findings.  Bowel sounds x4 quadrants. Nontender to palpation. Musculoskeletal: Left shoulder without deformity/dislocation/sulcus sign. Normal ROM. No rotator cuff deficits.  No gross deformities of extremities. Neurologic: Normal composite fists. Normal speech and language. No gross focal neurologic deficits are appreciated.  Skin:  Skin is warm, dry and intact. No rash noted. ____________________________________________   LABS (all labs ordered are listed, but only abnormal results are displayed)   Labs Reviewed  CBC WITH DIFFERENTIAL/PLATELET  COMPREHENSIVE METABOLIC PANEL  TROPONIN I (HIGH SENSITIVITY)  ____________________________________________   RADIOLOGY  Official radiology report(s): Dg Shoulder Right  Result Date: 06/12/2019 CLINICAL DATA:  Shoulder pain EXAM: RIGHT SHOULDER - 2+ VIEW COMPARISON:  None. FINDINGS: Mild AC joint degenerative change.  No fracture or malalignment. IMPRESSION: No acute osseous abnormality. Electronically Signed   By: Donavan Foil M.D.   On: 06/12/2019 14:22   Dg Chest Portable 1 View  Result Date: 06/12/2019 CLINICAL DATA:  Right shoulder pain. EXAM: PORTABLE CHEST 1 VIEW COMPARISON:  Chest x-ray dated October 19, 2016. FINDINGS: The heart size and mediastinal contours are within normal limits. Both lungs are clear. The visualized skeletal structures are unremarkable. IMPRESSION: No active disease. Electronically Signed   By: Titus Dubin M.D.   On: 06/12/2019 15:07   ____________________________________________   INITIAL IMPRESSION / MDM / [place] / ED COURSE  As part of my  medical decision making, I reviewed the following data within the [facility] by EM attending.  Notes from prior ED visits and [place] Controlled Substance Database  Clinical Impression: Left arm pain   Plan: review labs, EKG, and imaging. Final diagnosis and disposition pending results   Patient has been screened based based on their arrival complaint, evaluated for an emergent condition, and at a minimum has received a medical screening exam.  At this time, patient will receive further work-up as determined by medical screening exam.  Patient care will eventually be transferred to another provider in the emergency department for final diagnosis and disposition.  ____________________________________________  Note:  This document was prepared using Systems analyst and may include unintentional dictation errors.    Melvenia Needles, PA-C 06/12/19 1512    Merlyn Lot, MD 06/12/19 [phone]

## 2019-07-03 ENCOUNTER — Other Ambulatory Visit: Payer: Self-pay

## 2019-07-03 DIAGNOSIS — Z20822 Contact with and (suspected) exposure to covid-19: Secondary | ICD-10-CM

## 2019-07-05 LAB — NOVEL CORONAVIRUS, NAA: SARS-CoV-2, NAA: NOT DETECTED

## 2020-01-26 ENCOUNTER — Encounter: Payer: Self-pay | Admitting: Emergency Medicine

## 2020-01-26 ENCOUNTER — Other Ambulatory Visit: Payer: Self-pay

## 2020-01-26 ENCOUNTER — Emergency Department
Admission: EM | Admit: 2020-01-26 | Discharge: 2020-01-26 | Disposition: A | Discharge status: Home / Self Care | Payer: Medicare HMO | Attending: Emergency Medicine | Admitting: Emergency Medicine

## 2020-01-26 DIAGNOSIS — Z7984 Long term (current) use of oral hypoglycemic drugs: Secondary | ICD-10-CM | POA: Diagnosis not present

## 2020-01-26 DIAGNOSIS — F17210 Nicotine dependence, cigarettes, uncomplicated: Secondary | ICD-10-CM | POA: Diagnosis not present

## 2020-01-26 DIAGNOSIS — E119 Type 2 diabetes mellitus without complications: Secondary | ICD-10-CM | POA: Diagnosis not present

## 2020-01-26 DIAGNOSIS — Z9114 Patient's other noncompliance with medication regimen: Secondary | ICD-10-CM | POA: Diagnosis not present

## 2020-01-26 DIAGNOSIS — N3281 Overactive bladder: Secondary | ICD-10-CM | POA: Diagnosis not present

## 2020-01-26 DIAGNOSIS — R300 Dysuria: Secondary | ICD-10-CM | POA: Diagnosis present

## 2020-01-26 DIAGNOSIS — I10 Essential (primary) hypertension: Secondary | ICD-10-CM | POA: Insufficient documentation

## 2020-01-26 LAB — URINALYSIS, ROUTINE W REFLEX MICROSCOPIC
Bacteria, UA: NONE SEEN
Bilirubin Urine: NEGATIVE
Glucose, UA: 50 mg/dL — AB
Hgb urine dipstick: NEGATIVE
Ketones, ur: NEGATIVE mg/dL
Leukocytes,Ua: NEGATIVE
Nitrite: NEGATIVE
Protein, ur: >=300 mg/dL — AB
Specific Gravity, Urine: 1.031 — ABNORMAL HIGH (ref 1.005–1.030)
pH: 5 (ref 5.0–8.0)

## 2020-01-26 LAB — CBC
HCT: 37.2 % (ref 36.0–46.0)
Hemoglobin: 12.2 g/dL (ref 12.0–15.0)
MCH: 28 pg (ref 26.0–34.0)
MCHC: 32.8 g/dL (ref 30.0–36.0)
MCV: 85.5 fL (ref 80.0–100.0)
Platelets: 329 10*3/uL (ref 150–400)
RBC: 4.35 MIL/uL (ref 3.87–5.11)
RDW: 15.9 % — ABNORMAL HIGH (ref 11.5–15.5)
WBC: 8.2 10*3/uL (ref 4.0–10.5)
nRBC: 0 % (ref 0.0–0.2)

## 2020-01-26 LAB — COMPREHENSIVE METABOLIC PANEL
ALT: 15 U/L (ref 0–44)
AST: 17 U/L (ref 15–41)
Albumin: 2.6 g/dL — ABNORMAL LOW (ref 3.5–5.0)
Alkaline Phosphatase: 93 U/L (ref 38–126)
Anion gap: 10 (ref 5–15)
BUN: 18 mg/dL (ref 6–20)
CO2: 26 mmol/L (ref 22–32)
Calcium: 8.7 mg/dL — ABNORMAL LOW (ref 8.9–10.3)
Chloride: 105 mmol/L (ref 98–111)
Creatinine, Ser: 1.14 mg/dL — ABNORMAL HIGH (ref 0.44–1.00)
GFR calc Af Amer: >60 mL/min (ref >60)
GFR calc non Af Amer: 52 mL/min — ABNORMAL LOW (ref >60)
Glucose, Bld: 203 mg/dL — ABNORMAL HIGH (ref 70–99)
Potassium: 3 mmol/L — ABNORMAL LOW (ref 3.5–5.1)
Sodium: 141 mmol/L (ref 135–145)
Total Bilirubin: 0.7 mg/dL (ref 0.3–1.2)
Total Protein: 6.8 g/dL (ref 6.5–8.1)

## 2020-01-26 LAB — LIPASE, BLOOD: Lipase: 51 U/L (ref 11–51)

## 2020-01-26 MED ORDER — LOSARTAN POTASSIUM 50 MG PO TABS
100.0000 mg | ORAL_TABLET | Freq: Once | ORAL | Status: AC
  Administered 2020-01-26: 100 mg via ORAL
  Filled 2020-01-26: qty 2

## 2020-01-26 MED ORDER — DARIFENACIN HYDROBROMIDE ER 15 MG PO TB24
15.0000 mg | ORAL_TABLET | Freq: Every day | ORAL | 2 refills | Status: DC
Start: 2020-01-26 — End: 2020-02-28

## 2020-01-26 NOTE — ED Triage Notes (Signed)
Pt to ER from [place].  States she went to [facility] today because she was urinating more frequently.  States they told her to come here due to blood in her urine and elevated BP.  Pt denies back pain, states occasional abdominal pain.  Pt denies pain or burning with urination.

## 2020-01-26 NOTE — ED Provider Notes (Signed)
[facility] Emergency Department Provider Note  ____________________________________________   First MD Initiated Contact with Patient 01/26/20 1934     (approximate)  I have reviewed the triage vital signs and the nursing notes.   HISTORY  Chief Complaint Dysuria    HPI Alicia Rogers is a 59 y.o. female [facility]  Patient states that she has been urinating more frequently and is concerned she might have an infection.  She was placed on oxybutynin by her regular physician which she states is not working.  She is wet herself at least 3 times daily.  She however is refusing to wear pads or depends.  She denies any fever or chills.  She denies chest pain or shortness of breath.  She denies headache.  Patient has a history of schizophrenia, hypertension, diabetes, and overactive bladder/incontinence.   Patient also did not take her blood pressure medication this morning.   Past Medical History:  Diagnosis Date  . Depression   . Diabetes mellitus without complication ([place])   . Diabetes mellitus, type II ([place])   . Hypertension   . Schizophrenia [facility])     Patient Active Problem List   Diagnosis Date Noted  . Compulsive tobacco user syndrome 10/11/2014  . BP (high blood pressure) 09/04/2014  . 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  . Claudication ([place]) 06/09/2014  . Schizoaffective disorder, depressive type ([place]) 06/09/2014  . Diabetes mellitus, type 2 ([place]) 06/09/2014  . Type 2 diabetes mellitus ([place]) 06/09/2014  . Controlled type 2 diabetes mellitus without complication ([place]) 03/88/8280    History reviewed. No pertinent surgical history.  Prior to Admission medications   Medication Sig Start Date End Date Taking? Authorizing Provider  ACCU-CHEK FASTCLIX LANCETS [place]  02/17/15   [provider]  ACCU-CHEK SMARTVIEW test strip  04/02/15   [provider]  atorvastatin  (LIPITOR) 80 MG tablet Take 40 mg by mouth daily at 6 PM.  08/20/15   [provider]  Blood Glucose Monitoring Suppl (FIFTY50 GLUCOSE METER 2.0) W/DEVICE KIT Check blood sugar 3 times a day. Dx E11.9 07/04/14   [provider]  darifenacin (ENABLEX) 15 MG 24 hr tablet Take 1 tablet (15 mg total) by mouth daily. 01/26/20 01/25/21  Caryn Section Linden Dolin, PA-C  glipiZIDE (GLUCOTROL XL) 10 MG 24 hr tablet Take 10 mg by mouth daily. 06/11/16 06/11/17  [provider]  Lancet Devices (RELION LANCING DEVICE) [place]  05/09/14   [provider]  losartan (COZAAR) 100 MG tablet Take 100 mg by mouth daily.    [provider]  lurasidone (LATUDA) 80 MG TABS tablet Take 1 tablet (80 mg total) by mouth daily. 02/21/17   Hinda Kehr, MD  metFORMIN (GLUCOPHAGE) 1000 MG tablet Take 1,000 mg by mouth daily with breakfast.  08/20/15   [provider]  sitaGLIPtin (JANUVIA) 100 MG tablet Take 100 mg by mouth daily.    [provider]  traZODone (DESYREL) 150 MG tablet Take 1 tablet (150 mg total) by mouth at bedtime as needed for sleep. Patient not taking: Reported on 02/21/2017 06/08/16 01/26/20  Elvin So, MD    Allergies Penicillins  Family History  Problem Relation Age of Onset  . Diabetes Mother   . Glaucoma Mother   . Heart disease Mother   . Heart attack Mother   . Depression Mother   . Hypertension Mother   . Diabetes Father   . Glaucoma Father   .  Hypertension Father   . Diabetes Sister   . Depression Sister   . Post-traumatic stress disorder Sister     Social History Social History   Tobacco Use  . Smoking status: Current Every Day Smoker    Packs/day: 1.00    Types: Cigarettes    Start date: 04/21/1978  . Smokeless tobacco: Never Used  Substance Use Topics  . Alcohol use: No    Alcohol/week: 0.0 standard drinks  . Drug use: No    Review of Systems  Constitutional: No fever/chills Eyes: No visual changes. ENT: No sore  throat. Respiratory: Denies cough Cardiovascular: Denies chest pain Gastrointestinal: Denies abdominal pain Genitourinary: Negative for dysuria.  Positive for urinary frequency Musculoskeletal: Negative for back pain. Skin: Negative for rash. Psychiatric: no mood changes,     ____________________________________________   PHYSICAL EXAM:  VITAL SIGNS: ED Triage Vitals [01/26/20 1749]  Enc Vitals Group     BP (!) 122/99     Pulse Rate 61     Resp 18     Temp 98.1 F (36.7 C)     Temp Source Oral     SpO2 100 %     Weight 106 lb (48.1 kg)     Height 5' 2.5" (1.588 m)     Head Circumference      Peak Flow      Pain Score 0     Pain Loc      Pain Edu?      Excl. in [place]?     Constitutional: Alert and oriented. Well appearing and in no acute distress. Eyes: Conjunctivae are normal.  Head: Atraumatic. Nose: No congestion/rhinnorhea. Mouth/Throat: Mucous membranes are moist.   Neck:  supple no lymphadenopathy noted Cardiovascular: Normal rate, regular rhythm. Heart sounds are normal Respiratory: Normal respiratory effort.  No retractions, lungs c t a  Abd: soft nontender bs normal all 4 quad, no CVA tenderness GU: deferred Musculoskeletal: FROM all extremities, warm and well perfused Neurologic:  Normal speech and language.  Skin:  Skin is warm, dry and intact. No rash noted. Psychiatric: Mood and affect are normal. Speech and behavior are normal.  ____________________________________________   LABS (all labs ordered are listed, but only abnormal results are displayed)  Labs Reviewed  COMPREHENSIVE METABOLIC PANEL - Abnormal; Notable for the following components:      Result Value   Potassium 3.0 (*)    Glucose, Bld 203 (*)    Creatinine, Ser 1.14 (*)    Calcium 8.7 (*)    Albumin 2.6 (*)    GFR calc non Af Amer 52 (*)    All other components within normal limits  CBC - Abnormal; Notable for the following components:   RDW 15.9 (*)    All other components  within normal limits  URINALYSIS, ROUTINE W REFLEX MICROSCOPIC - Abnormal; Notable for the following components:   Color, Urine YELLOW (*)    APPearance CLEAR (*)    Specific Gravity, Urine 1.031 (*)    Glucose, UA 50 (*)    Protein, ur >=300 (*)    All other components within normal limits  URINE CULTURE  LIPASE, BLOOD   ____________________________________________   ____________________________________________  RADIOLOGY    ____________________________________________   PROCEDURES  Procedure(s) performed: No  Procedures    ____________________________________________   INITIAL IMPRESSION / ASSESSMENT AND PLAN / ED COURSE  Pertinent labs & imaging results that were available during my care of the patient were reviewed by me and considered in my medical  decision making (see chart for details).   The patient is a 59 year old female with history of schizophrenia, diabetes, hypertension who [facility]  [facility] concerned about her blood pressure and that she had blood in her urine.  Physical exam is unremarkable other than her blood pressure.  DDx: UTI, overactive bladder, urinary incontinence, hypertension noncompliant with medications, elevated glucose  CBC is normal, comprehensive metabolic panel shows potassium of 3, glucose 203, creatinine is stable at 1.14 compared to her old labs, urinalysis has greater than 300 protein with 50 glucose, lipase normal  I did explain the findings to the patient.  She does not have infection.  She is already taking potassium for her low potassium levels.  I do not feel that she has infection but I will order a urine culture.  Explained to the patient that oxybutynin just may not be working as well so we changed that to the Enablex.  She can follow-up with her regular doctor concerning her incontinence and further treatment.  Do not feel this is an emergent matter.  The blood pressure will be treated  with her regular medication which I also do not feel is emergent due to her noncompliance..  I feel that she will be stable for discharge.  On triage her blood pressure was 122/99.  On reexamination at discharge it had elevated to 229/66 which I think is the more accurate reading.  Due to her not taking her blood pressure medication this will be a typical finding.  We did give her medication, losartan, for her blood pressure.    Alicia Rogers was evaluated in Emergency Department on 01/26/2020 for the symptoms described in the history of present illness. She was evaluated in the context of the global COVID-19 pandemic, which necessitated consideration that the patient might be at risk for infection with the SARS-CoV-2 virus that causes COVID-19. Institutional protocols and algorithms that pertain to the evaluation of patients at risk for COVID-19 are in a state of rapid change based on information released by regulatory bodies including the CDC and federal and state organizations. These policies and algorithms were followed during the patient's care in the ED.   As part of my medical decision making, I reviewed the following data within the [place] notes reviewed and incorporated, Labs reviewed , Old chart reviewed, Notes from prior ED visits and [place] Controlled Substance Database  ____________________________________________   FINAL CLINICAL IMPRESSION(S) / ED DIAGNOSES  Final diagnoses:  Overactive bladder  Noncompliance with medications  Essential hypertension      NEW MEDICATIONS STARTED DURING THIS VISIT:  New Prescriptions   DARIFENACIN (ENABLEX) 15 MG 24 HR TABLET    Take 1 tablet (15 mg total) by mouth daily.     Note:  This document was prepared using Dragon voice recognition software and may include unintentional dictation errors.    Versie Starks, PA-C 01/26/20 2046    Earleen Newport, MD 01/26/20 2112

## 2020-01-26 NOTE — ED Notes (Addendum)
Patient given two cans of ginger ale per patient's request

## 2020-01-26 NOTE — ED Notes (Signed)
Ashok Cordia PA-C aware of blood pressure.

## 2020-01-28 LAB — URINE CULTURE

## 2020-02-08 ENCOUNTER — Ambulatory Visit: Payer: Medicare HMO | Admitting: Urology

## 2020-02-18 NOTE — Progress Notes (Signed)
02/19/2020 11:41 AM   Alicia Rogers January 08, 1961 [id number]  Referring provider: Marygrace Drought, MD No address on file Chief Complaint  Patient presents with  . Over Active Bladder    New Patient    HPI: Alicia Rogers is a 59 y.o. female with a UTI and an overactive bladder who presents for an evaluation and management.   The patient was presented to the ED on 01/26/2020 for dysuria. Patient stated that she had been urinating more frequently and was concerned for infection. She was placed on oxybutynin by her regular physician which she stated did not work. She was switched to Enablex. She wets herself at least 3 times daily.  She however refused to wear pads or depends. She denied any fever or chills.      UA showed glucose 50, protein >=300, mucus and hyaline casts was present. Urine culture showed multiple species present. Lipase was 51.   Patient has a personal history of schizophrenia, hypertension, diabetes, and overactive bladder/incontinence. Unclear what medications she is on for diabetes.  She is a relatively poor historian.  She denies being on Lasix anymore however this prescription was recently filled.  PVR is 47 mL.  She has frequency and urgency and sometimes she can not make it to the bathroom. She used to be on oxybutynin for a while, she notes a recent dosage change to 15 mg. The new 15 mg dosage does not work as good anymore.  She never filled the Enablex prescription provided by the emergency room.  She has little insight as to why this was prescribed for her.  She is drinking dark sodas like Pepsi. She notes that she does not drink a lot of fluids on a daily basis.   She has an extensive smoking history.    PMH: Past Medical History:  Diagnosis Date  . Depression   . Diabetes mellitus without complication ([place])   . Diabetes mellitus, type II ([place])   . Hypertension   . Schizophrenia [facility])     Surgical History: No past surgical history on  file.  Home Medications:  Allergies as of 02/19/2020      Reactions   Penicillins    Rash      Medication List       Accurate as of February 19, 2020 11:41 AM. If you have any questions, ask your nurse or doctor.        Accu-Chek FastClix Lancets Misc   Accu-Chek SmartView test strip Generic drug: glucose blood   atorvastatin 80 MG tablet Commonly known as: LIPITOR Take 40 mg by mouth daily at 6 PM.   darifenacin 15 MG 24 hr tablet Commonly known as: ENABLEX Take 1 tablet (15 mg total) by mouth daily.   Fifty50 Glucose Meter 2.0 w/Device Kit Check blood sugar 3 times a day. Dx E11.9   glipiZIDE 10 MG 24 hr tablet Commonly known as: GLUCOTROL XL Take 10 mg by mouth daily.   losartan 100 MG tablet Commonly known as: COZAAR Take 100 mg by mouth daily.   lurasidone 80 MG Tabs tablet Commonly known as: LATUDA Take 1 tablet (80 mg total) by mouth daily.   metFORMIN 1000 MG tablet Commonly known as: GLUCOPHAGE Take 1,000 mg by mouth daily with breakfast.   ReliOn Lancing Device Misc   sitaGLIPtin 100 MG tablet Commonly known as: JANUVIA Take 100 mg by mouth daily.       Allergies:  Allergies  Allergen Reactions  . Penicillins  Rash    Family History: Family History  Problem Relation Age of Onset  . Diabetes Mother   . Glaucoma Mother   . Heart disease Mother   . Heart attack Mother   . Depression Mother   . Hypertension Mother   . Diabetes Father   . Glaucoma Father   . Hypertension Father   . Diabetes Sister   . Depression Sister   . Post-traumatic stress disorder Sister     Social History:  reports that she has been smoking cigarettes. She started smoking about 41 years ago. She has been smoking about 1.00 pack per day. She has never used smokeless tobacco. She reports that she does not drink alcohol and does not use drugs.   Physical Exam: BP (!) 199/72   Pulse 77   Ht '5\' 2"'  (1.575 m)   Wt 103 lb (46.7 kg)   BMI 18.84 kg/m    Constitutional:  Alert and oriented, No acute distress. Poor historian.  HEENT: [place] AT, moist mucus membranes.  Trachea midline, no masses. Cardiovascular: No clubbing, cyanosis, or edema. Respiratory: Normal respiratory effort, no increased work of breathing. Skin: No rashes, bruises or suspicious lesions. Neurologic: Grossly intact, no focal deficits, moving all 4 extremities. Psychiatric: Normal mood and affect.  Laboratory Data:  Lab Results  Component Value Date   CREATININE 1.14 (H) 01/26/2020    Urinalysis  Pertinent Imaging: Results for orders placed or performed in visit on 02/19/20  BLADDER SCAN AMB NON-IMAGING  Result Value Ref Range   Scan Result 42m    Urinalysis today unremarkable other than 3-10 red blood cells per high-power field.   Assessment & Plan:    1. Urinary urge incontinence  Symptoms ongoing and refractory to oxybutynin. She has adequate emptying, PVR was 47 mL. Agree with trial of Enablex, explained that sometimes changing anticholinergics can be helpful in controlling OAB type symptoms Will work-up #2 to rule out any underlying pathology Will plan to avoid beta 3 agonist at this time given her significant hypertension Reassess symptoms next visit  AVS was provided today and tried to be very clear with the patient about stopping oxybutynin and starting Enablex -Trial Enablex.    2. Microscopic Hematuria UA today showed microscopic hematuria. High risk based on smoking history. Recommending a cystoscopy and CT hematuria work up.   [address] [place] [phone] I, ASelena Batten am acting as a scribe for Dr. AHollice Espy  I have reviewed the above documentation for accuracy and completeness, and I agree with the above.   AHollice Espy MD

## 2020-02-19 ENCOUNTER — Ambulatory Visit (INDEPENDENT_AMBULATORY_CARE_PROVIDER_SITE_OTHER): Payer: Medicare HMO | Admitting: Urology

## 2020-02-19 ENCOUNTER — Encounter: Payer: Self-pay | Admitting: Urology

## 2020-02-19 ENCOUNTER — Other Ambulatory Visit: Payer: Self-pay

## 2020-02-19 VITALS — BP 199/72 | HR 77 | Ht 62.0 in | Wt 103.0 lb

## 2020-02-19 DIAGNOSIS — N3941 Urge incontinence: Secondary | ICD-10-CM | POA: Diagnosis not present

## 2020-02-19 DIAGNOSIS — R3129 Other microscopic hematuria: Secondary | ICD-10-CM

## 2020-02-19 DIAGNOSIS — N3281 Overactive bladder: Secondary | ICD-10-CM | POA: Diagnosis not present

## 2020-02-19 LAB — URINALYSIS, COMPLETE
Bilirubin, UA: NEGATIVE
Glucose, UA: NEGATIVE
Ketones, UA: NEGATIVE
Leukocytes,UA: NEGATIVE
Nitrite, UA: NEGATIVE
Specific Gravity, UA: 1.025 (ref 1.005–1.030)
Urobilinogen, Ur: 0.2 mg/dL (ref 0.2–1.0)
pH, UA: 6 (ref 5.0–7.5)

## 2020-02-19 LAB — BLADDER SCAN AMB NON-IMAGING

## 2020-02-19 LAB — MICROSCOPIC EXAMINATION

## 2020-02-19 NOTE — Patient Instructions (Addendum)
Cystoscopy Cystoscopy is a procedure that is used to help diagnose and sometimes treat conditions that affect the lower urinary tract. The lower urinary tract includes the bladder and the urethra. The urethra is the tube that drains urine from the bladder. Cystoscopy is done using a thin, tube-shaped instrument with a light and camera at the end (cystoscope). The cystoscope may be hard or flexible, depending on the goal of the procedure. The cystoscope is inserted through the urethra, into the bladder. Cystoscopy may be recommended if you have:  Urinary tract infections that keep coming back.  Blood in the urine (hematuria).  An inability to control when you urinate (urinary incontinence) or an overactive bladder.  Unusual cells found in a urine sample.  A blockage in the urethra, such as a urinary stone.  Painful urination.  An abnormality in the bladder found during an intravenous pyelogram (IVP) or CT scan. Cystoscopy may also be done to remove a sample of tissue to be examined under a microscope (biopsy). What are the risks? Generally, this is a safe procedure. However, problems may occur, including:  Infection.  Bleeding.  What happens during the procedure?  1. You will be given one or more of the following: ? A medicine to numb the area (local anesthetic). 2. The area around the opening of your urethra will be cleaned. 3. The cystoscope will be passed through your urethra into your bladder. 4. Germ-free (sterile) fluid will flow through the cystoscope to fill your bladder. The fluid will stretch your bladder so that your health care provider can clearly examine your bladder walls. 5. Your doctor will look at the urethra and bladder. 6. The cystoscope will be removed The procedure may vary among health care providers  What can I expect after the procedure? After the procedure, it is common to have: 1. Some soreness or pain in your abdomen and urethra. 2. Urinary symptoms.  These include: ? Mild pain or burning when you urinate. Pain should stop within a few minutes after you urinate. This may last for up to 1 week. ? A small amount of blood in your urine for several days. ? Feeling like you need to urinate but producing only a small amount of urine. Follow these instructions at home: General instructions  Return to your normal activities as told by your health care provider.   Do not drive for 24 hours if you were given a sedative during your procedure.  Watch for any blood in your urine. If the amount of blood in your urine increases, call your health care provider.  If a tissue sample was removed for testing (biopsy) during your procedure, it is up to you to get your test results. Ask your health care provider, or the department that is doing the test, when your results will be ready.  Drink enough fluid to keep your urine pale yellow.  Keep all follow-up visits as told by your health care provider. This is important. Contact a health care provider if you:  Have pain that gets worse or does not get better with medicine, especially pain when you urinate.  Have trouble urinating.  Have more blood in your urine. Get help right away if you:  Have blood clots in your urine.  Have abdominal pain.  Have a fever or chills.  Are unable to urinate. Summary  Cystoscopy is a procedure that is used to help diagnose and sometimes treat conditions that affect the lower urinary tract.  Cystoscopy is done using   a thin, tube-shaped instrument with a light and camera at the end.  After the procedure, it is common to have some soreness or pain in your abdomen and urethra.  Watch for any blood in your urine. If the amount of blood in your urine increases, call your health care provider.  If you were prescribed an antibiotic medicine, take it as told by your health care provider. Do not stop taking the antibiotic even if you start to feel better. This  information is not intended to replace advice given to you by your health care provider. Make sure you discuss any questions you have with your health care provider. Document Revised: 07/11/2018 Document Reviewed: 07/11/2018 [employer] Patient Education  2020 [employer]   

## 2020-02-27 ENCOUNTER — Encounter: Payer: Self-pay | Admitting: Emergency Medicine

## 2020-02-27 ENCOUNTER — Other Ambulatory Visit: Payer: Self-pay

## 2020-02-27 DIAGNOSIS — R42 Dizziness and giddiness: Secondary | ICD-10-CM | POA: Diagnosis not present

## 2020-02-27 DIAGNOSIS — K760 Fatty (change of) liver, not elsewhere classified: Secondary | ICD-10-CM | POA: Insufficient documentation

## 2020-02-27 DIAGNOSIS — E1129 Type 2 diabetes mellitus with other diabetic kidney complication: Secondary | ICD-10-CM | POA: Diagnosis not present

## 2020-02-27 DIAGNOSIS — I2510 Atherosclerotic heart disease of native coronary artery without angina pectoris: Secondary | ICD-10-CM | POA: Insufficient documentation

## 2020-02-27 DIAGNOSIS — I129 Hypertensive chronic kidney disease with stage 1 through stage 4 chronic kidney disease, or unspecified chronic kidney disease: Secondary | ICD-10-CM | POA: Diagnosis not present

## 2020-02-27 DIAGNOSIS — R319 Hematuria, unspecified: Secondary | ICD-10-CM | POA: Insufficient documentation

## 2020-02-27 DIAGNOSIS — M6281 Muscle weakness (generalized): Secondary | ICD-10-CM | POA: Diagnosis not present

## 2020-02-27 DIAGNOSIS — E876 Hypokalemia: Secondary | ICD-10-CM | POA: Insufficient documentation

## 2020-02-27 DIAGNOSIS — Z20822 Contact with and (suspected) exposure to covid-19: Secondary | ICD-10-CM | POA: Insufficient documentation

## 2020-02-27 DIAGNOSIS — M545 Low back pain: Secondary | ICD-10-CM | POA: Insufficient documentation

## 2020-02-27 DIAGNOSIS — I700 Atherosclerosis of aorta: Secondary | ICD-10-CM | POA: Insufficient documentation

## 2020-02-27 DIAGNOSIS — I161 Hypertensive emergency: Principal | ICD-10-CM | POA: Insufficient documentation

## 2020-02-27 DIAGNOSIS — R748 Abnormal levels of other serum enzymes: Secondary | ICD-10-CM | POA: Diagnosis not present

## 2020-02-27 DIAGNOSIS — Z7984 Long term (current) use of oral hypoglycemic drugs: Secondary | ICD-10-CM | POA: Insufficient documentation

## 2020-02-27 DIAGNOSIS — E785 Hyperlipidemia, unspecified: Secondary | ICD-10-CM | POA: Diagnosis not present

## 2020-02-27 DIAGNOSIS — N1831 Chronic kidney disease, stage 3a: Secondary | ICD-10-CM | POA: Diagnosis not present

## 2020-02-27 DIAGNOSIS — F17210 Nicotine dependence, cigarettes, uncomplicated: Secondary | ICD-10-CM | POA: Diagnosis not present

## 2020-02-27 DIAGNOSIS — E1122 Type 2 diabetes mellitus with diabetic chronic kidney disease: Secondary | ICD-10-CM | POA: Insufficient documentation

## 2020-02-27 DIAGNOSIS — R262 Difficulty in walking, not elsewhere classified: Secondary | ICD-10-CM | POA: Insufficient documentation

## 2020-02-27 DIAGNOSIS — Z79899 Other long term (current) drug therapy: Secondary | ICD-10-CM | POA: Diagnosis not present

## 2020-02-27 DIAGNOSIS — M549 Dorsalgia, unspecified: Secondary | ICD-10-CM | POA: Diagnosis present

## 2020-02-27 NOTE — ED Triage Notes (Addendum)
Pt presents to ED with reoccurring lower back pain the past couple of days. Pain increases when ambulating,  bending over, and picking things up. Hx of the same.  Pt also reports pain and swelling in her feet since this morning. Blood pressure currently elevated; pt states she has been taking her medications as prescribed.

## 2020-02-28 ENCOUNTER — Observation Stay: Payer: Medicare HMO

## 2020-02-28 ENCOUNTER — Other Ambulatory Visit: Payer: Self-pay

## 2020-02-28 ENCOUNTER — Observation Stay
Admission: EM | Admit: 2020-02-28 | Discharge: 2020-02-29 | Disposition: A | Discharge status: Home / Self Care | Payer: Medicare HMO | Attending: Internal Medicine | Admitting: Internal Medicine

## 2020-02-28 ENCOUNTER — Emergency Department: Payer: Medicare HMO

## 2020-02-28 DIAGNOSIS — N1831 Chronic kidney disease, stage 3a: Secondary | ICD-10-CM | POA: Diagnosis not present

## 2020-02-28 DIAGNOSIS — R319 Hematuria, unspecified: Secondary | ICD-10-CM | POA: Diagnosis present

## 2020-02-28 DIAGNOSIS — Z720 Tobacco use: Secondary | ICD-10-CM | POA: Diagnosis present

## 2020-02-28 DIAGNOSIS — R7989 Other specified abnormal findings of blood chemistry: Secondary | ICD-10-CM | POA: Diagnosis present

## 2020-02-28 DIAGNOSIS — M549 Dorsalgia, unspecified: Secondary | ICD-10-CM | POA: Diagnosis present

## 2020-02-28 DIAGNOSIS — R778 Other specified abnormalities of plasma proteins: Secondary | ICD-10-CM | POA: Diagnosis present

## 2020-02-28 DIAGNOSIS — I160 Hypertensive urgency: Secondary | ICD-10-CM

## 2020-02-28 DIAGNOSIS — E782 Mixed hyperlipidemia: Secondary | ICD-10-CM | POA: Diagnosis present

## 2020-02-28 DIAGNOSIS — E1129 Type 2 diabetes mellitus with other diabetic kidney complication: Secondary | ICD-10-CM | POA: Diagnosis present

## 2020-02-28 DIAGNOSIS — E785 Hyperlipidemia, unspecified: Secondary | ICD-10-CM | POA: Diagnosis present

## 2020-02-28 DIAGNOSIS — E876 Hypokalemia: Secondary | ICD-10-CM | POA: Diagnosis present

## 2020-02-28 DIAGNOSIS — I161 Hypertensive emergency: Secondary | ICD-10-CM

## 2020-02-28 DIAGNOSIS — I10 Essential (primary) hypertension: Secondary | ICD-10-CM | POA: Diagnosis present

## 2020-02-28 DIAGNOSIS — R42 Dizziness and giddiness: Secondary | ICD-10-CM

## 2020-02-28 DIAGNOSIS — N184 Chronic kidney disease, stage 4 (severe): Secondary | ICD-10-CM | POA: Diagnosis present

## 2020-02-28 LAB — URINALYSIS, COMPLETE (UACMP) WITH MICROSCOPIC
Bacteria, UA: NONE SEEN
Bilirubin Urine: NEGATIVE
Glucose, UA: 150 mg/dL — AB
Hgb urine dipstick: NEGATIVE
Ketones, ur: NEGATIVE mg/dL
Leukocytes,Ua: NEGATIVE
Nitrite: NEGATIVE
Protein, ur: 100 mg/dL — AB
Specific Gravity, Urine: 1.027 (ref 1.005–1.030)
pH: 6 (ref 5.0–8.0)

## 2020-02-28 LAB — COMPREHENSIVE METABOLIC PANEL
ALT: 13 U/L (ref 0–44)
AST: 18 U/L (ref 15–41)
Albumin: 2.7 g/dL — ABNORMAL LOW (ref 3.5–5.0)
Alkaline Phosphatase: 85 U/L (ref 38–126)
Anion gap: 10 (ref 5–15)
BUN: 27 mg/dL — ABNORMAL HIGH (ref 6–20)
CO2: 29 mmol/L (ref 22–32)
Calcium: 8.5 mg/dL — ABNORMAL LOW (ref 8.9–10.3)
Chloride: 104 mmol/L (ref 98–111)
Creatinine, Ser: 1.33 mg/dL — ABNORMAL HIGH (ref 0.44–1.00)
GFR calc Af Amer: 50 mL/min — ABNORMAL LOW (ref >60)
GFR calc non Af Amer: 43 mL/min — ABNORMAL LOW (ref >60)
Glucose, Bld: 139 mg/dL — ABNORMAL HIGH (ref 70–99)
Potassium: 3.3 mmol/L — ABNORMAL LOW (ref 3.5–5.1)
Sodium: 143 mmol/L (ref 135–145)
Total Bilirubin: 0.5 mg/dL (ref 0.3–1.2)
Total Protein: 7.1 g/dL (ref 6.5–8.1)

## 2020-02-28 LAB — CBC WITH DIFFERENTIAL/PLATELET
Abs Immature Granulocytes: 0.03 10*3/uL (ref 0.00–0.07)
Basophils Absolute: 0 10*3/uL (ref 0.0–0.1)
Basophils Relative: 0 %
Eosinophils Absolute: 0.1 10*3/uL (ref 0.0–0.5)
Eosinophils Relative: 1 %
HCT: 38.9 % (ref 36.0–46.0)
Hemoglobin: 12.3 g/dL (ref 12.0–15.0)
Immature Granulocytes: 0 %
Lymphocytes Relative: 31 %
Lymphs Abs: 2.6 10*3/uL (ref 0.7–4.0)
MCH: 27.9 pg (ref 26.0–34.0)
MCHC: 31.6 g/dL (ref 30.0–36.0)
MCV: 88.2 fL (ref 80.0–100.0)
Monocytes Absolute: 0.8 10*3/uL (ref 0.1–1.0)
Monocytes Relative: 10 %
Neutro Abs: 4.9 10*3/uL (ref 1.7–7.7)
Neutrophils Relative %: 58 %
Platelets: 310 10*3/uL (ref 150–400)
RBC: 4.41 MIL/uL (ref 3.87–5.11)
RDW: 15.2 % (ref 11.5–15.5)
WBC: 8.4 10*3/uL (ref 4.0–10.5)
nRBC: 0 % (ref 0.0–0.2)

## 2020-02-28 LAB — TROPONIN I (HIGH SENSITIVITY)
Troponin I (High Sensitivity): 21 ng/L — ABNORMAL HIGH (ref <18)
Troponin I (High Sensitivity): 22 ng/L — ABNORMAL HIGH (ref <18)
Troponin I (High Sensitivity): 25 ng/L — ABNORMAL HIGH (ref <18)
Troponin I (High Sensitivity): 32 ng/L — ABNORMAL HIGH (ref <18)

## 2020-02-28 LAB — BRAIN NATRIURETIC PEPTIDE: B Natriuretic Peptide: 106.3 pg/mL — ABNORMAL HIGH (ref 0.0–100.0)

## 2020-02-28 LAB — URINE DRUG SCREEN, QUALITATIVE (ARMC ONLY)
Amphetamines, Ur Screen: NOT DETECTED
Barbiturates, Ur Screen: NOT DETECTED
Benzodiazepine, Ur Scrn: NOT DETECTED
Cannabinoid 50 Ng, Ur ~~LOC~~: NOT DETECTED
Cocaine Metabolite,Ur ~~LOC~~: NOT DETECTED
MDMA (Ecstasy)Ur Screen: NOT DETECTED
Methadone Scn, Ur: NOT DETECTED
Opiate, Ur Screen: NOT DETECTED
Phencyclidine (PCP) Ur S: NOT DETECTED
Tricyclic, Ur Screen: NOT DETECTED

## 2020-02-28 LAB — HIV ANTIBODY (ROUTINE TESTING W REFLEX): HIV Screen 4th Generation wRfx: NONREACTIVE

## 2020-02-28 LAB — HEMOGLOBIN A1C
Hgb A1c MFr Bld: 7.8 % — ABNORMAL HIGH (ref 4.8–5.6)
Mean Plasma Glucose: 177 mg/dL

## 2020-02-28 LAB — GLUCOSE, CAPILLARY
Glucose-Capillary: 140 mg/dL — ABNORMAL HIGH (ref 70–99)
Glucose-Capillary: 124 mg/dL — ABNORMAL HIGH (ref 70–99)
Glucose-Capillary: 114 mg/dL — ABNORMAL HIGH (ref 70–99)

## 2020-02-28 LAB — SARS CORONAVIRUS 2 BY RT PCR (HOSPITAL ORDER, PERFORMED IN ~~LOC~~ HOSPITAL LAB): SARS Coronavirus 2: NEGATIVE

## 2020-02-28 MED ORDER — ASPIRIN EC 81 MG PO TBEC
81.0000 mg | DELAYED_RELEASE_TABLET | Freq: Every day | ORAL | Status: DC
  Administered 2020-02-28 – 2020-02-29 (×2): 81 mg via ORAL
  Filled 2020-02-28 (×2): qty 1

## 2020-02-28 MED ORDER — LABETALOL HCL 5 MG/ML IV SOLN
10.0000 mg | Freq: Once | INTRAVENOUS | Status: AC
  Administered 2020-02-28: 10 mg via INTRAVENOUS
  Filled 2020-02-28: qty 4

## 2020-02-28 MED ORDER — ONDANSETRON HCL 4 MG/2ML IJ SOLN: 4.0000 mg | Freq: Three times a day (TID) | INTRAMUSCULAR | Status: DC | PRN

## 2020-02-28 MED ORDER — AMLODIPINE BESYLATE 10 MG PO TABS
10.0000 mg | ORAL_TABLET | Freq: Every day | ORAL | Status: DC
  Administered 2020-02-28 – 2020-02-29 (×2): 10 mg via ORAL
  Filled 2020-02-28: qty 2
  Filled 2020-02-28: qty 1

## 2020-02-28 MED ORDER — IOHEXOL 350 MG/ML SOLN
75.0000 mL | Freq: Once | INTRAVENOUS | Status: AC | PRN
  Administered 2020-02-28: 75 mL via INTRAVENOUS

## 2020-02-28 MED ORDER — ATORVASTATIN CALCIUM 20 MG PO TABS
40.0000 mg | ORAL_TABLET | Freq: Every day | ORAL | Status: DC
  Administered 2020-02-28: 40 mg via ORAL
  Filled 2020-02-28: qty 2

## 2020-02-28 MED ORDER — INSULIN ASPART 100 UNIT/ML ~~LOC~~ SOLN: 0.0000 [IU] | Freq: Every day | SUBCUTANEOUS | Status: DC

## 2020-02-28 MED ORDER — HYDRALAZINE HCL 20 MG/ML IJ SOLN
5.0000 mg | INTRAMUSCULAR | Status: DC | PRN
  Administered 2020-02-28: 5 mg via INTRAVENOUS
  Filled 2020-02-28: qty 1

## 2020-02-28 MED ORDER — METHOCARBAMOL 500 MG PO TABS
500.0000 mg | ORAL_TABLET | Freq: Three times a day (TID) | ORAL | Status: DC | PRN
  Filled 2020-02-28: qty 1

## 2020-02-28 MED ORDER — HYDRALAZINE HCL 20 MG/ML IJ SOLN
5.0000 mg | Freq: Once | INTRAMUSCULAR | Status: AC
  Administered 2020-02-28: 5 mg via INTRAVENOUS
  Filled 2020-02-28: qty 1

## 2020-02-28 MED ORDER — POTASSIUM CHLORIDE CRYS ER 20 MEQ PO TBCR
40.0000 meq | EXTENDED_RELEASE_TABLET | Freq: Once | ORAL | Status: AC
  Administered 2020-02-28: 40 meq via ORAL
  Filled 2020-02-28: qty 2

## 2020-02-28 MED ORDER — INSULIN ASPART 100 UNIT/ML ~~LOC~~ SOLN
0.0000 [IU] | Freq: Three times a day (TID) | SUBCUTANEOUS | Status: DC
  Administered 2020-02-28 – 2020-02-29 (×3): 1 [IU] via SUBCUTANEOUS
  Filled 2020-02-28 (×3): qty 1

## 2020-02-28 MED ORDER — LABETALOL HCL 5 MG/ML IV SOLN: 20.0000 mg | Freq: Once | INTRAVENOUS | Status: DC

## 2020-02-28 MED ORDER — NICOTINE 21 MG/24HR TD PT24
21.0000 mg | MEDICATED_PATCH | Freq: Every day | TRANSDERMAL | Status: DC
  Filled 2020-02-28 (×2): qty 1

## 2020-02-28 MED ORDER — ACETAMINOPHEN 500 MG PO TABS
1000.0000 mg | ORAL_TABLET | Freq: Once | ORAL | Status: AC
  Administered 2020-02-28: 1000 mg via ORAL
  Filled 2020-02-28: qty 2

## 2020-02-28 MED ORDER — HYDRALAZINE HCL 20 MG/ML IJ SOLN
10.0000 mg | Freq: Once | INTRAMUSCULAR | Status: AC
  Administered 2020-02-28: 10 mg via INTRAVENOUS

## 2020-02-28 MED ORDER — OXYCODONE-ACETAMINOPHEN 5-325 MG PO TABS
1.0000 | ORAL_TABLET | ORAL | Status: DC | PRN
  Administered 2020-02-28 – 2020-02-29 (×3): 1 via ORAL
  Filled 2020-02-28 (×3): qty 1

## 2020-02-28 MED ORDER — ACETAMINOPHEN 325 MG PO TABS: 650.0000 mg | ORAL_TABLET | Freq: Four times a day (QID) | ORAL | Status: DC | PRN

## 2020-02-28 NOTE — H&P (Addendum)
History and Physical    Alicia Rogers KJZ:[id number] DOB: 13-Jul-1961 DOA: 02/28/2020  Referring MD/NP/PA:   PCP: Marygrace Drought, MD   Patient coming from:  The patient is coming from home.  At baseline, pt is independent for most of ADL.        Chief Complaint: back pain  HPI: Alicia Rogers is a 59 y.o. female with medical history significant of hypertension, hyperlipidemia, diabetes mellitus, schizophrenia, tobacco abuse, CKD stage IIIa, depression, who presents with low back pain.  Patient states that she has been having back pain in the past 3 days, which involves both upper and lower back, constant, severe, sharp, nonradiating.  It is aggravated by movement.  She states that she has dizziness today, but denies unilateral numbness or tingling in all extremities.  No facial droop or slurred speech.  Does not feel room spinning around her.  She has a mildly dry cough, mild shortness of breath, no chest pain.  Denies fever or chills.  She states that she noticed little blood in the urine yesterday.  She states sometimes that she has some mild dysuria and urinary frequency.  No burning on urination. Patient was found to have elevated blood pressure 239/76, which improved with 221/80 after giving 10 mg of IV labetalol and 5 mg of hydralazine in ED.  ED Course: pt was found to have WBC 8.4, troponin XX 1, 22, 25, 32, negative urinalysis, pending COVID-19 PCR, slightly worsened renal function, potassium 3.3, temperature 99.4, heart rate 58, RR 18, oxygen saturation 98% on room air.  Chest x-ray negative.  CT head negative.  CT angiogram is negative for aortic dissection.  Patient is placed on progressive bed of observation  CT angiogram:  CT angiogram chest:  1. No thoracic aortic or great vessel aneurysm or dissection. There are foci of aortic atherosclerosis. There are foci of coronary artery calcification.  2.  No demonstrable pulmonary embolus.  3. Centrilobular  emphysematous change. Areas of mild atelectatic change. No edema or airspace opacity. No pleural effusions.  4.  No evident thoracic adenopathy.  CT angiogram abdomen; CT angiogram pelvis:  1. There is aortic and proximal pelvic arterial atherosclerosis as noted. No aneurysm or dissection involving the aorta, major mesenteric, and major pelvic arterial vessels. No fibromuscular dysplasia evident.  2.  Enlarged uterus, most likely containing leiomyomas.  3. No bowel obstruction. No abscess in the abdomen or pelvis. No periappendiceal region inflammation.  4.  Apparent adenomas arising from the left adrenal.  5.  Hepatic steatosis.  Aortic Atherosclerosis (ICD10-I70.0) and Emphysema (ICD10-J43.9).   Review of Systems:   General: no fevers, chills, no body weight gain, has fatigue HEENT: no blurry vision, hearing changes or sore throat Respiratory: no dyspnea, coughing, wheezing CV: no chest pain, no palpitations GI: no nausea, vomiting, abdominal pain, diarrhea, constipation GU: has dysuria, hematuria  increased urinary frequency, no burning on urination, Ext: no leg edema Neuro: no unilateral weakness, numbness, or tingling, no vision change or hearing loss Skin: no rash, no skin tear. MSK: has back pain Heme: No easy bruising.  Travel history: No recent long distant travel.  Allergy:  Allergies  Allergen Reactions  . Penicillins     Rash    Past Medical History:  Diagnosis Date  . Depression   . Diabetes mellitus without complication ([place])   . Diabetes mellitus, type II ([place])   . Hypertension   . Schizophrenia ([place])     History reviewed. No pertinent surgical history.  Social History:  reports that she has been smoking cigarettes. She started smoking about 41 years ago. She has been smoking about 1.00 pack per day. She has never used smokeless tobacco. She reports that she does not drink alcohol and does not use drugs.  Family History:  Family  History  Problem Relation Age of Onset  . Diabetes Mother   . Glaucoma Mother   . Heart disease Mother   . Heart attack Mother   . Depression Mother   . Hypertension Mother   . Diabetes Father   . Glaucoma Father   . Hypertension Father   . Diabetes Sister   . Depression Sister   . Post-traumatic stress disorder Sister      Prior to Admission medications   Medication Sig Start Date End Date Taking? Authorizing Provider  ACCU-CHEK FASTCLIX LANCETS [place]  02/17/15   [provider]  ACCU-CHEK SMARTVIEW test strip  04/02/15   [provider]  atorvastatin (LIPITOR) 80 MG tablet Take 40 mg by mouth daily at 6 PM.  08/20/15   [provider]  Blood Glucose Monitoring Suppl (FIFTY50 GLUCOSE METER 2.0) W/DEVICE KIT Check blood sugar 3 times a day. Dx E11.9 07/04/14   [provider]  darifenacin (ENABLEX) 15 MG 24 hr tablet Take 1 tablet (15 mg total) by mouth daily. Patient not taking: Reported on 02/19/2020 01/26/20 01/25/21  Versie Starks, PA-C  furosemide (LASIX) 20 MG tablet Take 20 mg by mouth daily as needed. 10/08/19   [provider]  hydrochlorothiazide (HYDRODIURIL) 12.5 MG tablet Take 12.5 mg by mouth daily. 12/06/19   [provider]  Lancet Devices (RELION LANCING DEVICE) [place]  05/09/14   [provider]  losartan (COZAAR) 100 MG tablet Take 100 mg by mouth daily.    [provider]  lurasidone (LATUDA) 80 MG TABS tablet Take 1 tablet (80 mg total) by mouth daily. 02/21/17   Hinda Kehr, MD  metFORMIN (GLUCOPHAGE) 1000 MG tablet Take 1,000 mg by mouth daily with breakfast.  08/20/15   [provider]  sitaGLIPtin (JANUVIA) 100 MG tablet Take 100 mg by mouth daily.    [provider]  traZODone (DESYREL) 150 MG tablet Take 1 tablet (150 mg total) by mouth at bedtime as needed for sleep. Patient not taking: Reported on 02/21/2017 06/08/16 01/26/20  Elvin So, MD    Physical Exam: Vitals:    02/28/20 0900 02/28/20 0930 02/28/20 1000 02/28/20 1504  BP: (!) 163/69 (!) 136/53 (!) 134/63 (!) 152/57  Pulse:   81 69  Resp:   18 20  Temp:    98.2 F (36.8 C)  TempSrc:    Oral  SpO2:   98% 99%  Weight:      Height:       General: Not in acute distress HEENT:       Eyes: PERRL, EOMI, no scleral icterus.       ENT: No discharge from the ears and nose, no pharynx injection, no tonsillar enlargement.        Neck: No JVD, no bruit, no mass felt. Heme: No neck lymph node enlargement. Cardiac: S1/S2, RRR, No murmurs, No gallops or rubs. Respiratory:  No rales, wheezing, rhonchi or rubs. GI: Soft, nondistended, nontender, no rebound pain, no organomegaly, BS present. GU: No hematuria Ext: No pitting leg edema bilaterally. 2+DP/PT pulse bilaterally. Musculoskeletal: has tenderness in midline of upper and lower back. Skin: No rashes.  Neuro: Alert, oriented X3, cranial nerves II-XII  grossly intact, moves all extremities normally.  Psych: Patient is not psychotic, no suicidal or hemocidal ideation.  Labs on Admission: I have personally reviewed following labs and imaging studies  CBC: Recent Labs  Lab 02/28/20 0010  WBC 8.4  NEUTROABS 4.9  HGB 12.3  HCT 38.9  MCV 88.2  PLT 657   Basic Metabolic Panel: Recent Labs  Lab 02/28/20 0010  NA 143  K 3.3*  CL 104  CO2 29  GLUCOSE 139*  BUN 27*  CREATININE 1.33*  CALCIUM 8.5*   GFR: Estimated Creatinine Clearance: 33.2 mL/min (A) (by C-G formula based on SCr of 1.33 mg/dL (H)). Liver Function Tests: Recent Labs  Lab 02/28/20 0010  AST 18  ALT 13  ALKPHOS 85  BILITOT 0.5  PROT 7.1  ALBUMIN 2.7*   No results for input(s): LIPASE, AMYLASE in the last 168 hours. No results for input(s): AMMONIA in the last 168 hours. Coagulation Profile: No results for input(s): INR, PROTIME in the last 168 hours. Cardiac Enzymes: No results for input(s): CKTOTAL, CKMB, CKMBINDEX, TROPONINI in the last 168 hours. BNP (last 3  results) No results for input(s): PROBNP in the last 8760 hours. HbA1C: No results for input(s): HGBA1C in the last 72 hours. CBG: Recent Labs  Lab 02/28/20 1219 02/28/20 1755  GLUCAP 140* 124*   Lipid Profile: No results for input(s): CHOL, HDL, LDLCALC, TRIG, CHOLHDL, LDLDIRECT in the last 72 hours. Thyroid Function Tests: No results for input(s): TSH, T4TOTAL, FREET4, T3FREE, THYROIDAB in the last 72 hours. Anemia Panel: No results for input(s): VITAMINB12, FOLATE, FERRITIN, TIBC, IRON, RETICCTPCT in the last 72 hours. Urine analysis:    Component Value Date/Time   COLORURINE STRAW (A) 02/28/2020 1208   APPEARANCEUR CLEAR (A) 02/28/2020 1208   APPEARANCEUR Cloudy (A) 02/19/2020 1129   LABSPEC 1.027 02/28/2020 1208   PHURINE 6.0 02/28/2020 1208   GLUCOSEU 150 (A) 02/28/2020 1208   HGBUR NEGATIVE 02/28/2020 [place] 02/28/2020 1208   BILIRUBINUR Negative 02/19/2020 1129   [place] 02/28/2020 1208   PROTEINUR 100 (A) 02/28/2020 1208   NITRITE NEGATIVE 02/28/2020 1208   LEUKOCYTESUR NEGATIVE 02/28/2020 1208   Sepsis Labs: _0 (procalcitonin:4,lacticidven:4) ) Recent Results (from the past 240 hour(s))  Microscopic Examination     Status: Abnormal   Collection Time: 02/19/20 11:29 AM   Urine  Result Value Ref Range Status   WBC, UA 0-5 0 - 5 /hpf Final   RBC 3-10 (A) 0 - 2 /hpf Final   Epithelial Cells (non renal) 0-10 0 - 10 /hpf Final   Casts Present (A) None seen /lpf Final   Cast Type Granular casts (A) N/A Final    Comment: White cell casts   Bacteria, UA Moderate (A) None seen/Few Final  SARS Coronavirus 2 by RT PCR (hospital order, performed in [place] hospital lab) Nasopharyngeal Nasopharyngeal Swab     Status: None   Collection Time: 02/28/20  7:58 AM   Specimen: Nasopharyngeal Swab  Result Value Ref Range Status   SARS Coronavirus 2 NEGATIVE NEGATIVE Final    Comment: (NOTE) SARS-CoV-2 target nucleic acids are NOT  DETECTED.  The SARS-CoV-2 RNA is generally detectable in upper and lower respiratory specimens during the acute phase of infection. The lowest concentration of SARS-CoV-2 viral copies this assay can detect is 250 copies / mL. A negative result does not preclude SARS-CoV-2 infection and should not be used as the sole basis for treatment or other patient management decisions.  A  negative result may occur with improper specimen collection / handling, submission of specimen other than nasopharyngeal swab, presence of viral mutation(s) within the areas targeted by this assay, and inadequate number of viral copies (<250 copies / mL). A negative result must be combined with clinical observations, patient history, and epidemiological information.  Fact Sheet for Patients:   StrictlyIdeas.no  Fact Sheet for Healthcare Providers: BankingDealers.co.za  This test is not yet approved or  cleared by the [place] FDA and has been authorized for detection and/or diagnosis of SARS-CoV-2 by FDA under an Emergency Use Authorization (EUA).  This EUA will remain in effect (meaning this test can be used) for the duration of the COVID-19 declaration under Section 564(b)(1) of the Act, 21 U.S.C. section 360bbb-3(b)(1), unless the authorization is terminated or revoked sooner.  Performed at [facility], [address] [place], [place] [address]      Radiological Exams on Admission: DG Chest 2 View  Result Date: 02/28/2020 CLINICAL DATA:  Lower extremity swelling EXAM: CHEST - 2 VIEW COMPARISON:  None. FINDINGS: The heart size and mediastinal contours are within normal limits. There is hyperinflation of the upper lung zones. No large airspace consolidation or pleural effusion. The visualized skeletal structures are unremarkable. IMPRESSION: No active cardiopulmonary disease. Electronically Signed   By: Prudencio Pair M.D.   On: 02/28/2020 00:38     CT HEAD WO CONTRAST  Result Date: 02/28/2020 CLINICAL DATA:  Dizziness. EXAM: CT HEAD WITHOUT CONTRAST TECHNIQUE: Contiguous axial images were obtained from the base of the skull through the vertex without intravenous contrast. COMPARISON:  November 11, 2004 FINDINGS: Brain: No evidence of acute infarction, hemorrhage, hydrocephalus, extra-axial collection or mass lesion/mass effect. Vascular: No hyperdense vessel or unexpected calcification. Skull: Normal. Negative for fracture or focal lesion. Sinuses/Orbits: No acute finding. Other: None. IMPRESSION: No acute intracranial pathology. Electronically Signed   By: Virgina Norfolk M.D.   On: 02/28/2020 11:11   CT Angio Chest/Abd/Pel for Dissection W and/or Wo Contrast  Result Date: 02/28/2020 CLINICAL DATA:  Hypertension with chest and abdominal pain EXAM: CT ANGIOGRAPHY CHEST, ABDOMEN AND PELVIS TECHNIQUE: Non-contrast CT of the chest was initially obtained. Multidetector CT imaging through the chest, abdomen and pelvis was performed using the standard protocol during bolus administration of intravenous contrast. Multiplanar reconstructed images and MIPs were obtained and reviewed to evaluate the vascular anatomy. CONTRAST:  81m OMNIPAQUE IOHEXOL 350 MG/ML SOLN COMPARISON:  Chest radiograph February 28, 2020. FINDINGS: CTA CHEST FINDINGS Cardiovascular: There is no intramural hematoma evident on noncontrast enhanced study. There is no appreciable thoracic aortic aneurysm or dissection. Visualized great vessels appear normal. No aneurysm or dissection involving these vessels. Note that the right innominate and left common carotid arteries arise as a common trunk, an anatomic variant. There is no appreciable aortic plaque seen in the visualized great vessels. There are foci of peripheral thoracic aortic calcification consistent with atherosclerosis. No hemodynamically significant narrowing noted in the thoracic aorta. There are foci of coronary artery  calcification present. No pericardial effusion or pericardial thickening is evident. No pulmonary embolus is appreciable on this study. Mediastinum/Nodes: Thyroid is mildly inhomogeneous. There is a 6 mm nodular opacity in the left lobe of the thyroid which per consensus guidelines does not warrant additional imaging surveillance. A 6 mm nodular opacity also noted in the right lobe of the thyroid, again not warranting additional imaging surveillance. There is no appreciable thoracic adenopathy. No esophageal lesions are evident. Lungs/Pleura: There is a degree of underlying  centrilobular emphysematous change. There are areas of mild atelectasis. There is no evident edema or airspace opacity. No pleural effusions are appreciable. Musculoskeletal: No fracture or dislocation is demonstrable. No blastic or lytic bone lesions. No evident chest wall lesions. Review of the MIP images confirms the above findings. CTA ABDOMEN AND PELVIS FINDINGS VASCULAR Aorta: There is no appreciable abdominal aortic aneurysm or dissection. There is atherosclerotic plaque throughout the mid and distal aspects of the aorta with irregular plaque in the distal aorta causing approximately 50% diameter stenosis in the distal most aspect of the aorta. Moderate calcified plaque is noted in this area as well as areas of decreased attenuation plaque. No well-defined ulceration seen in this region by CT. Celiac: The celiac artery and its branches are widely patent. No aneurysm or dissection involving these vessels. SMA: Superior mesenteric artery and its branches are widely patent. No aneurysm or dissection involving these vessels. Renals: There is a single renal artery on the right with a branch arising from the proximal main renal artery on the right. There is a main renal artery on the left with an accessory renal artery branch arising from the aorta which supplies the upper pole the left kidney. Renal arteries and their respective branches appear  patent. No aneurysm or dissection involving these vessels. No evident fibromuscular dysplasia. IMA: The inferior mesenteric artery and its branches are patent. No aneurysm or dissection involving these vessels evident. Inflow: No aneurysm or dissection involving the pelvic arterial vessels. There is moderate calcification in the proximal common iliac arteries bilaterally with areas of 50-60% diameter stenosis in each proximal common iliac artery. Calcification is noted in each proximal to mid hypogastric artery. There is no appreciable narrowing of either external iliac artery. There is mild calcified plaque in the distal right and left common femoral arteries without hemodynamically significant stenosis. The proximal profunda femoral and superficial femoral arteries are patent bilaterally. Veins: No obvious venous abnormality within the limitations of this arterial phase study. Review of the MIP images confirms the above findings. NON-VASCULAR Hepatobiliary: There is a degree of hepatic steatosis. No focal liver lesions evident in the liver on arterial phase imaging. Gallbladder wall is not appreciably thickened. There is no biliary duct dilatation. Pancreas: No evident pancreatic mass or inflammatory focus. Spleen: No splenic lesions are evident. Adrenals/Urinary Tract: Right adrenal appears normal. There is a left adrenal mass measuring 1.9 x 1.8 cm which is felt to be consistent with an adenoma based on attenuation criteria. An apparent second adrenal adenoma on the left measures 1.2 x 1.2 cm. There is a 1 x 1 cm cyst arising from the upper pole the left kidney. There is no appreciable hydronephrosis on either side. There is no evident renal or ureteral calculus on either side. Urinary bladder is midline with wall thickness within normal limits. Stomach/Bowel: There is no appreciable bowel wall or mesenteric thickening. There is no evident bowel obstruction. The terminal ileum appears normal. No evident free air  or portal venous air. Lymphatic: No adenopathy is appreciable in the abdomen or pelvis. Reproductive: The uterus is midline. The uterus is enlarged with suspected diffuse leiomyomatous change. No extrauterine pelvic mass. Other: No periappendiceal region inflammation. No abscess or ascites is appreciable in the abdomen or pelvis. Musculoskeletal: No blastic or lytic bone lesions. No intramuscular or abdominal wall lesions are evident. Review of the MIP images confirms the above findings. IMPRESSION: CT angiogram chest: 1. No thoracic aortic or great vessel aneurysm or dissection. There are foci of  aortic atherosclerosis. There are foci of coronary artery calcification. 2.  No demonstrable pulmonary embolus. 3. Centrilobular emphysematous change. Areas of mild atelectatic change. No edema or airspace opacity. No pleural effusions. 4.  No evident thoracic adenopathy. CT angiogram abdomen; CT angiogram pelvis: 1. There is aortic and proximal pelvic arterial atherosclerosis as noted. No aneurysm or dissection involving the aorta, major mesenteric, and major pelvic arterial vessels. No fibromuscular dysplasia evident. 2.  Enlarged uterus, most likely containing leiomyomas. 3. No bowel obstruction. No abscess in the abdomen or pelvis. No periappendiceal region inflammation. 4.  Apparent adenomas arising from the left adrenal. 5.  Hepatic steatosis. Aortic Atherosclerosis (ICD10-I70.0) and Emphysema (ICD10-J43.9). Electronically Signed   By: Lowella Grip III M.D.   On: 02/28/2020 07:32     EKG: Independently reviewed.  Sinus rhythm, QTC 431, LAE, mild T wave inversion in V5-V6  Assessment/Plan Principal Problem:   Hypertensive urgency Active Problems:   Back pain   CKD (chronic kidney disease), stage IIIa   Hypokalemia   Elevated troponin   Hematuria   Tobacco abuse   Dizziness   HLD (hyperlipidemia)   Type II diabetes mellitus with renal manifestations (HCC)   Hypertensive urgency: Initial blood  pressure 239/76.  Patient received IV 10 mg of labetalol, 5 mg of IV hydralazine.  Blood pressure improved with 201/80.  -Placed on progressive benefit observation -Continue another 10 mg of hydralazine IV -Continue as needed IV hydralazine -Hold home Lasix, HCTZ, Cozaar due to worsening renal function -Start amlodipine 10 mg daily  Elevated troponin: Troponin 21, 22, 25, 32.  No chest pain.  Most likely due to demand ischemia secondary to hypertensive urgency. -Check A1c, FLP, UDS -Aspirin 81 mg, Lipitor  Back pain: Etiology is not clear.  No alarming symptoms. -As needed Percocet -As needed Robaxin  CKD (chronic kidney disease), stage IIIa: Baseline creatinine 1.01-1.2.  Her creatinine is 1.33, BUN 27, slightly worsening than baseline -Hold Lasix, HCTZ, Cozaar -Follow-up by BMP  Hypokalemia -Repleted -Check magnesium level  Hematuria: Urinalysis negative.  Urinalysis showed no RBC.  Etiology is not clear. -Patient need to follow-up with urology as outpatient  Tobacco abuse -Nicotine patch  Dizziness: No focal neuro deficit on physical examination.  Since pt has severely elevated bp at 239/762, will get CT-head to r/o any possibility of micro intracranial bleeding.  -CT head --> the result is negative for acute intracranial abnormalities.  HLD: -lipitor  Type II diabetes mellitus with renal manifestations ([place]): no A1c on record. Pt is taking Januvia -SSI -Follow-up A1c      DVT ppx: SCD Code Status: Full code Family Communication: not done, no family member is at bed side.    Disposition Plan:  Anticipate discharge back to previous environment Consults called:  none Admission status:   progressive unit for obs    Status is: Observation  The patient remains OBS appropriate and will d/c before 2 midnights.  Dispo: The patient is from: Home              Anticipated d/c is to: Home              Anticipated d/c date is: 1 day              Patient currently is  not medically stable to d/c.          Date of Service 02/28/2020    Ivor Costa [facility]   If 7PM-7AM, please contact night-coverage [email or web address] 02/28/2020, 6:04 PM

## 2020-02-28 NOTE — ED Notes (Signed)
Pt ambulatory to bathroom w/ steady gait

## 2020-02-28 NOTE — ED Notes (Signed)
Pt transported for CT 

## 2020-02-28 NOTE — ED Notes (Signed)
Pt transported to CT via wheelchair.

## 2020-02-28 NOTE — ED Notes (Signed)
Dr Ellender Hose notified of continued elevated BP- will get another BP approximately in 10 minutes

## 2020-02-28 NOTE — ED Provider Notes (Signed)
Reviewed CT which is fortunately negative for acute dissection. Trop relatively stable though slightly uptrending. BP remains elevated after IV labetalol and IV hydralazine. Admit for HTN urgency w/ possible end-organ injury. Dr. Blaine Hamper to admit. Pt updated and in agreement. No chest pain.   Duffy Bruce, MD 02/28/20 [phone]

## 2020-02-28 NOTE — ED Provider Notes (Addendum)
[facility] Emergency Department Provider Note   ____________________________________________   First MD Initiated Contact with Patient 02/28/20 [phone]     (approximate)  I have reviewed the triage vital signs and the nursing notes.   HISTORY  Chief Complaint Back Pain    HPI Alicia Rogers is a 59 y.o. female who tells me that she has had upper back pain for the last day or 2 that is worse with deep breathing or movement.  It gets worse when she walks around.  She is not having any chest pain.  She is also having edema in her ankles this has been going on for about 2 days.  Patient also says she has been having some blood in her urine for the last couple days.      Past Medical History:  Diagnosis Date  . Depression   . Diabetes mellitus without complication ([place])   . Diabetes mellitus, type II ([place])   . Hypertension   . Schizophrenia [facility])     Patient Active Problem List   Diagnosis Date Noted  . Compulsive tobacco user syndrome 10/11/2014  . BP (high blood pressure) 09/04/2014  . 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  . Claudication ([place]) 06/09/2014  . Schizoaffective disorder, depressive type ([place]) 06/09/2014  . Diabetes mellitus, type 2 ([place]) 06/09/2014  . Type 2 diabetes mellitus ([place]) 06/09/2014  . Controlled type 2 diabetes mellitus without complication ([place]) 78/93/8101    History reviewed. No pertinent surgical history.  Prior to Admission medications   Medication Sig Start Date End Date Taking? Authorizing Provider  ACCU-CHEK FASTCLIX LANCETS [place]  02/17/15   [provider]  ACCU-CHEK SMARTVIEW test strip  04/02/15   [provider]  atorvastatin (LIPITOR) 80 MG tablet Take 40 mg by mouth daily at 6 PM.  08/20/15   [provider]  Blood Glucose Monitoring Suppl (FIFTY50 GLUCOSE METER 2.0) W/DEVICE KIT Check blood sugar 3 times a day. Dx E11.9 07/04/14   [provider]    darifenacin (ENABLEX) 15 MG 24 hr tablet Take 1 tablet (15 mg total) by mouth daily. Patient not taking: Reported on 02/19/2020 01/26/20 01/25/21  Versie Starks, PA-C  furosemide (LASIX) 20 MG tablet Take 20 mg by mouth daily as needed. 10/08/19   [provider]  hydrochlorothiazide (HYDRODIURIL) 12.5 MG tablet Take 12.5 mg by mouth daily. 12/06/19   [provider]  Lancet Devices (RELION LANCING DEVICE) [place]  05/09/14   [provider]  losartan (COZAAR) 100 MG tablet Take 100 mg by mouth daily.    [provider]  lurasidone (LATUDA) 80 MG TABS tablet Take 1 tablet (80 mg total) by mouth daily. 02/21/17   Hinda Kehr, MD  metFORMIN (GLUCOPHAGE) 1000 MG tablet Take 1,000 mg by mouth daily with breakfast.  08/20/15   [provider]  sitaGLIPtin (JANUVIA) 100 MG tablet Take 100 mg by mouth daily.    [provider]  traZODone (DESYREL) 150 MG tablet Take 1 tablet (150 mg total) by mouth at bedtime as needed for sleep. Patient not taking: Reported on 02/21/2017 06/08/16 01/26/20  Elvin So, MD    Allergies Penicillins  Family History  Problem Relation Age of Onset  . Diabetes Mother   . Glaucoma Mother   . Heart disease Mother   . Heart attack Mother   . Depression Mother   . Hypertension Mother   . Diabetes Father   . Glaucoma Father   .  Hypertension Father   . Diabetes Sister   . Depression Sister   . Post-traumatic stress disorder Sister     Social History Social History   Tobacco Use  . Smoking status: Current Every Day Smoker    Packs/day: 1.00    Types: Cigarettes    Start date: 04/21/1978  . Smokeless tobacco: Never Used  Vaping Use  . Vaping Use: Never used  Substance Use Topics  . Alcohol use: No    Alcohol/week: 0.0 standard drinks  . Drug use: No    Review of Systems  Constitutional: No fever/chills Eyes: No visual changes! ENT: No sore throat. Cardiovascular: Denies chest pain. Respiratory: Denies  shortness of breath. Gastrointestinal: No abdominal pain.  No nausea, no vomiting.  No diarrhea.  No constipation. Genitourinary: Negative for dysuria. Musculoskeletal: back pain. Skin: Negative for rash. Neurological: Negative for headaches, focal weakness  ____________________________________________   PHYSICAL EXAM:  VITAL SIGNS: ED Triage Vitals  Enc Vitals Group     BP 02/27/20 2353 (!) 230/87     Pulse Rate 02/27/20 2353 65     Resp 02/27/20 2353 18     Temp 02/27/20 2353 99.4 F (37.4 C)     Temp Source 02/27/20 2353 Oral     SpO2 02/27/20 2353 97 %     Weight 02/27/20 2357 103 lb (46.7 kg)     Height 02/27/20 2357 '5\' 2"'  (1.575 m)     Head Circumference --      Peak Flow --      Pain Score 02/27/20 2356 10     Pain Loc --      Pain Edu? --      Excl. in [place]? --     Constitutional: Alert and oriented. Well appearing and in no acute distress but sitting very stiffly. Eyes: Conjunctivae are normal. PERRL. EOMI. fundi appear normal Head: Atraumatic. Nose: No congestion/rhinnorhea. Mouth/Throat: Mucous membranes are moist.  Oropharynx non-erythematous. Neck: No stridor.  Cardiovascular: Normal rate, regular rhythm. Grossly normal heart sounds.  Good peripheral circulation.  Pulse in right wrist is less than pulse in the left wrist blood pressures done on the right Respiratory: Normal respiratory effort.  No retractions. Lungs CTAB. Gastrointestinal: Soft and nontender. No distention. No abdominal bruits.  Musculoskeletal: No lower extremity tenderness bilateral 2+ ankle edema.   Neurologic:  Normal speech and language. No gross focal neurologic deficits are appreciated.  Skin:  Skin is warm, dry and intact. No rash noted.   ____________________________________________   LABS (all labs ordered are listed, but only abnormal results are displayed)  Labs Reviewed  COMPREHENSIVE METABOLIC PANEL - Abnormal; Notable for the following components:      Result Value    Potassium 3.3 (*)    Glucose, Bld 139 (*)    BUN 27 (*)    Creatinine, Ser 1.33 (*)    Calcium 8.5 (*)    Albumin 2.7 (*)    GFR calc non Af Amer 43 (*)    GFR calc Af Amer 50 (*)    All other components within normal limits  TROPONIN I (HIGH SENSITIVITY) - Abnormal; Notable for the following components:   Troponin I (High Sensitivity) 21 (*)    All other components within normal limits  TROPONIN I (HIGH SENSITIVITY) - Abnormal; Notable for the following components:   Troponin I (High Sensitivity) 22 (*)    All other components within normal limits  CBC WITH DIFFERENTIAL/PLATELET  URINALYSIS, COMPLETE (UACMP) WITH MICROSCOPIC  TROPONIN I (  HIGH SENSITIVITY)   ____________________________________________  EKG  EKG read interpreted by me shows normal sinus rhythm rate of 64 computer is reading short PR interval normal axis diffusely flipped T waves but very similar to an EKG from 20 November of last year ____________________________________________  RADIOLOGY  ED MD interpretation: Rest x-ray read by radiology reviewed by me shows no active disease  Official radiology report(s): DG Chest 2 View  Result Date: 02/28/2020 CLINICAL DATA:  Lower extremity swelling EXAM: CHEST - 2 VIEW COMPARISON:  None. FINDINGS: The heart size and mediastinal contours are within normal limits. There is hyperinflation of the upper lung zones. No large airspace consolidation or pleural effusion. The visualized skeletal structures are unremarkable. IMPRESSION: No active cardiopulmonary disease. Electronically Signed   By: Prudencio Pair M.D.   On: 02/28/2020 00:38    ____________________________________________   PROCEDURES  Procedure(s) performed (including Critical Care): Critical care time 30 minutes. This includes reviewing the patient's x-ray very closely talking to CT about the CT scan and examining the patient carefully and ordering labetalol. I have also reviewed her old records and labs and will  have to talk to the hospitalist.  Procedures   ____________________________________________   INITIAL IMPRESSION / ASSESSMENT AND PLAN / ED COURSE By history patient has new onset blood in the urine and ankle edema with back pain and is extremely high blood pressure.  The pain is somewhat worse with movement.  Her troponins elevated somewhat.  We will repeat the troponin and see if it is gone up any higher since 3:00 this morning.  I will give her some labetalol try to bring blood pressure down.  I am going to CT her as I am worried about dissection with the such a high blood pressure and also because she has the lower pulse in the right wrist on the left and the back pain.  She likely has hypertensive urgency at least with the apparent endorgan damage of the elevated troponin and blood in the urine and edema all of which is reported to be new.    ----------------------------------------- 7:15 AM on 02/28/2020 -----------------------------------------  We will get the CT back but the patient has new onset edema history of blood in the urine and new elevated troponin with a blood pressure that high this is these are signs of endorgan damage. We will have to get this person in the hospital with hypertensive emergency/   ----------------------------------------- 7:23 AM on 02/28/2020 -----------------------------------------  Signed out to oncoming physician      ____________________________________________   FINAL CLINICAL IMPRESSION(S) / ED DIAGNOSES  Final diagnoses:  Hypertensive emergency     ED Discharge Orders    None       Note:  This document was prepared using Dragon voice recognition software and may include unintentional dictation errors.    Nena Polio, MD 02/28/20 2197    Nena Polio, MD 02/28/20 [phone]

## 2020-02-28 NOTE — ED Notes (Signed)
Pt given meal tray.

## 2020-02-28 NOTE — ED Notes (Signed)
Pt given sandwich tray 

## 2020-02-29 DIAGNOSIS — I160 Hypertensive urgency: Secondary | ICD-10-CM | POA: Diagnosis not present

## 2020-02-29 DIAGNOSIS — R42 Dizziness and giddiness: Secondary | ICD-10-CM | POA: Diagnosis not present

## 2020-02-29 DIAGNOSIS — M549 Dorsalgia, unspecified: Secondary | ICD-10-CM | POA: Diagnosis not present

## 2020-02-29 LAB — BASIC METABOLIC PANEL
Anion gap: 7 (ref 5–15)
BUN: 24 mg/dL — ABNORMAL HIGH (ref 6–20)
CO2: 26 mmol/L (ref 22–32)
Calcium: 8.8 mg/dL — ABNORMAL LOW (ref 8.9–10.3)
Chloride: 107 mmol/L (ref 98–111)
Creatinine, Ser: 1.18 mg/dL — ABNORMAL HIGH (ref 0.44–1.00)
GFR calc Af Amer: 58 mL/min — ABNORMAL LOW (ref >60)
GFR calc non Af Amer: 50 mL/min — ABNORMAL LOW (ref >60)
Glucose, Bld: 166 mg/dL — ABNORMAL HIGH (ref 70–99)
Potassium: 3.5 mmol/L (ref 3.5–5.1)
Sodium: 140 mmol/L (ref 135–145)

## 2020-02-29 LAB — MAGNESIUM: Magnesium: 1.8 mg/dL (ref 1.7–2.4)

## 2020-02-29 LAB — LIPID PANEL
Cholesterol: 264 mg/dL — ABNORMAL HIGH (ref 0–200)
HDL: 78 mg/dL (ref >40)
LDL Cholesterol: 158 mg/dL — ABNORMAL HIGH (ref 0–99)
Total CHOL/HDL Ratio: 3.4 RATIO
Triglycerides: 140 mg/dL (ref <150)
VLDL: 28 mg/dL (ref 0–40)

## 2020-02-29 LAB — CBC
HCT: 34.2 % — ABNORMAL LOW (ref 36.0–46.0)
Hemoglobin: 10.9 g/dL — ABNORMAL LOW (ref 12.0–15.0)
MCH: 27.8 pg (ref 26.0–34.0)
MCHC: 31.9 g/dL (ref 30.0–36.0)
MCV: 87.2 fL (ref 80.0–100.0)
Platelets: 285 10*3/uL (ref 150–400)
RBC: 3.92 MIL/uL (ref 3.87–5.11)
RDW: 15.4 % (ref 11.5–15.5)
WBC: 9.1 10*3/uL (ref 4.0–10.5)
nRBC: 0 % (ref 0.0–0.2)

## 2020-02-29 LAB — TROPONIN I (HIGH SENSITIVITY)
Troponin I (High Sensitivity): 50 ng/L — ABNORMAL HIGH (ref <18)
Troponin I (High Sensitivity): 50 ng/L — ABNORMAL HIGH (ref <18)
Troponin I (High Sensitivity): 41 ng/L — ABNORMAL HIGH (ref <18)

## 2020-02-29 LAB — GLUCOSE, CAPILLARY
Glucose-Capillary: 148 mg/dL — ABNORMAL HIGH (ref 70–99)
Glucose-Capillary: 137 mg/dL — ABNORMAL HIGH (ref 70–99)

## 2020-02-29 MED ORDER — LOSARTAN POTASSIUM 50 MG PO TABS
100.0000 mg | ORAL_TABLET | Freq: Every day | ORAL | Status: DC
  Administered 2020-02-29: 100 mg via ORAL
  Filled 2020-02-29: qty 2

## 2020-02-29 MED ORDER — AMLODIPINE BESYLATE 10 MG PO TABS: 10.0000 mg | ORAL_TABLET | Freq: Every day | ORAL | 1 refills | Status: DC

## 2020-02-29 MED ORDER — ASPIRIN 81 MG PO TBEC: 81.0000 mg | DELAYED_RELEASE_TABLET | Freq: Every day | ORAL | 1 refills | Status: DC

## 2020-02-29 MED ORDER — METHOCARBAMOL 500 MG PO TABS: 500.0000 mg | ORAL_TABLET | Freq: Three times a day (TID) | ORAL | 0 refills | Status: DC | PRN

## 2020-02-29 MED ORDER — HYDROCHLOROTHIAZIDE 25 MG PO TABS
12.5000 mg | ORAL_TABLET | Freq: Every day | ORAL | Status: DC
  Administered 2020-02-29: 12.5 mg via ORAL
  Filled 2020-02-29 (×2): qty 1

## 2020-02-29 MED ORDER — LINAGLIPTIN 5 MG PO TABS
5.0000 mg | ORAL_TABLET | Freq: Every day | ORAL | Status: DC
  Administered 2020-02-29: 5 mg via ORAL
  Filled 2020-02-29: qty 1

## 2020-02-29 NOTE — Progress Notes (Signed)
   02/29/20 0400  Assess: MEWS Score  ECG Heart Rate 68  Resp (!) 29  Assess: MEWS Score  MEWS Temp 0  MEWS Systolic 0  MEWS Pulse 0  MEWS RR 2  MEWS LOC 0  MEWS Score 2  MEWS Score Color Yellow  Assess: if the MEWS score is Yellow or Red  Were vital signs taken at a resting state? Yes  Focused Assessment No change from prior assessment  Early Detection of Sepsis Score *See Row Information* Low  MEWS guidelines implemented *See Row Information* No, vital signs rechecked (tachypnea noted, will recheck RR)

## 2020-02-29 NOTE — Evaluation (Signed)
Physical Therapy Evaluation Patient Details Name: Alicia Rogers MRN: [id number] DOB: 11/27/1960 Today's Date: 02/29/2020   History of Present Illness  Patient is a 59 year old female with hypertension, hyperlipidemia, diabetes mellitus, schizophrenia, tobacco abuse, CKD stage IIIa, depression, who presents with low back pain, found to be hypertensive with systolic >989.  Clinical Impression   Patient is a pleasant 59 year old female who presents with diffuse pain of back, pelvis, and LE's. Patient is independent at home, lives with sister at sister's home. Reports she is ind with all ADLs and iADLs. Patient is safe and functional within room however does have antalgic pattern of ambulation reporting pain in back, pelvic, and bilateral LE's. Educated on positioning, stretching, and mobility for safety and pain reduction. Patient will benefit from skilled physical therapy while in the hospital to increase functional mobility with decreased pain. Upon discharge patient will benefit from outpatient PT to decrease pain with mobility.     Follow Up Recommendations Outpatient PT    Equipment Recommendations  None recommended by PT    Recommendations for Other Services       Precautions / Restrictions Precautions Precautions: None Restrictions Weight Bearing Restrictions: No      Mobility  Bed Mobility               General bed mobility comments: Patient out of bed upon PT arrival.  Transfers Overall transfer level: Independent Equipment used: None             General transfer comment: transfers w/o AD reports pain in knees and hips  Ambulation/Gait Ambulation/Gait assistance: Independent Gait Distance (Feet): 40 Feet Assistive device: None Gait Pattern/deviations: Step-through pattern;Antalgic     General Gait Details: Antalgic gait pattern of ambulation. Pain hips/knees/ankles per patient reports.  Stairs            Wheelchair Mobility    Modified  [place] (Stroke Patients Only)       Balance Overall balance assessment: Modified Independent                                           Pertinent Vitals/Pain Pain Assessment: 0-10 Pain Score: 6  Pain Location: bilateral knees, pelvis and back Pain Descriptors / Indicators: Aching;Tightness Pain Intervention(s): Monitored during session;Utilized relaxation techniques    Home Living Family/patient expects to be discharged to:: Private residence Living Arrangements: Other relatives (sister) Available Help at Discharge: Family Type of Home: House Home Access: Ramped entrance     Home Layout: One level Home Equipment: Cane - single point;Shower seat Additional Comments: Patient reports she lives with her sister at her sisters home, has a cane but doesn't use it.    Prior Function Level of Independence: Independent with assistive device(s)         Comments: Per patient she is ind with mobility, occasionally requires use of cane.     Hand Dominance        Extremity/Trunk Assessment   Upper Extremity Assessment Upper Extremity Assessment: Generalized weakness    Lower Extremity Assessment Lower Extremity Assessment: RLE deficits/detail;LLE deficits/detail RLE Deficits / Details: Hip flex 4-/5 abd/add 3/5, hamstring 3-/5, ankles 3/5 swelling/edema limited range RLE Sensation: decreased light touch RLE Coordination: decreased gross motor (jerky motion potentially due to anxiety.) LLE Deficits / Details: HIp flex 4/5, abd/add 3-/5 hamstring 3-/5, knees 4-/5 pain, ankles 3/5 edema limiting LLE Sensation:  decreased light touch       Communication   Communication: No difficulties  Cognition Arousal/Alertness: Awake/alert Behavior During Therapy: Restless Overall Cognitive Status: Within Functional Limits for tasks assessed                                 General Comments: Patient is hyperactive, able to follow commands but has  intermixed timing/occasional delay of response.      General Comments General comments (skin integrity, edema, etc.): Patient appears thin and malnourished.    Exercises General Exercises - Lower Extremity Ankle Circles/Pumps: Strengthening;Both;15 reps;Seated Long Arc Quad: Strengthening;Both;15 reps;Seated Hip Flexion/Marching: Strengthening;Both;10 reps;Standing Other Exercises Other Exercises: Patient educated on hip flexor lengthening stretch in standing modified lunge position. Patient educated on safe strengthening activities, role of PT, safe mobility.   Assessment/Plan    PT Assessment Patient needs continued PT services  PT Problem List Decreased strength;Decreased activity tolerance;Cardiopulmonary status limiting activity;Pain       PT Treatment Interventions Gait training;Stair training;Functional mobility training;Therapeutic activities;Neuromuscular re-education;Balance training;Therapeutic exercise;Manual techniques    PT Goals (Current goals can be found in the Care Plan section)  Acute Rehab PT Goals Patient Stated Goal: to have less pain PT Goal Formulation: With patient Time For Goal Achievement: 03/14/20 Potential to Achieve Goals: Fair    Frequency Min 2X/week   Barriers to discharge   would benefit from outpatient PT for pain reduction    Co-evaluation               AM-PAC PT "6 Clicks" Mobility  Outcome Measure Help needed turning from your back to your side while in a flat bed without using bedrails?: None Help needed moving from lying on your back to sitting on the side of a flat bed without using bedrails?: None Help needed moving to and from a bed to a chair (including a wheelchair)?: None Help needed standing up from a chair using your arms (e.g., wheelchair or bedside chair)?: None Help needed to walk in hospital room?: A Little Help needed climbing 3-5 steps with a railing? : A Little 6 Click Score: 22    End of Session Equipment  Utilized During Treatment: Gait belt Activity Tolerance: Patient tolerated treatment well Patient left: in bed;with call bell/phone within reach Nurse Communication: Mobility status PT Visit Diagnosis: Muscle weakness (generalized) (M62.81);Pain;Difficulty in walking, not elsewhere classified (R26.2) Pain - Right/Left:  (bilateral) Pain - part of body: Leg;Knee;Hip (back)    Time: 7939-0300 PT Time Calculation (min) (ACUTE ONLY): 12 min   Charges:   PT Evaluation $PT Eval Low Complexity: [place], PT, DPT    02/29/2020, 2:00 PM

## 2020-02-29 NOTE — Progress Notes (Signed)
PT Cancellation Note  Patient Details Name: Alicia Rogers MRN: [id number] DOB: 1960-10-16   Cancelled Treatment:    Reason Eval/Treat Not Completed: Patient declined, no reason specified;Other (comment) (Patient consult received and reviewed. Patient currently dressing self/closed door on PT attempting to enter room. Will attempt again later as available/able.)  Janna Arch, PT, DPT   02/29/2020, 1:25 PM

## 2020-02-29 NOTE — Plan of Care (Signed)
Discharge instructions provided to pt.  All questions addressed.  Understanding verified through teach back.  Awaiting transportation home via POV.   Problem: Education: Goal: Knowledge of General Education information will improve Description: Including pain rating scale, medication(s)/side effects and non-pharmacologic comfort measures Outcome: Adequate for Discharge   Problem: Health Behavior/Discharge Planning: Goal: Ability to manage health-related needs will improve Outcome: Adequate for Discharge   Problem: Clinical Measurements: Goal: Ability to maintain clinical measurements within normal limits will improve Outcome: Adequate for Discharge Goal: Will remain free from infection Outcome: Adequate for Discharge Goal: Diagnostic test results will improve Outcome: Adequate for Discharge Goal: Respiratory complications will improve Outcome: Adequate for Discharge Goal: Cardiovascular complication will be avoided Outcome: Adequate for Discharge   Problem: Activity: Goal: Risk for activity intolerance will decrease Outcome: Adequate for Discharge   Problem: Nutrition: Goal: Adequate nutrition will be maintained Outcome: Adequate for Discharge   Problem: Coping: Goal: Level of anxiety will decrease Outcome: Adequate for Discharge   Problem: Elimination: Goal: Will not experience complications related to bowel motility Outcome: Adequate for Discharge Goal: Will not experience complications related to urinary retention Outcome: Adequate for Discharge   Problem: Pain Managment: Goal: General experience of comfort will improve Outcome: Adequate for Discharge   Problem: Safety: Goal: Ability to remain free from injury will improve Outcome: Adequate for Discharge   Problem: Skin Integrity: Goal: Risk for impaired skin integrity will decrease Outcome: Adequate for Discharge

## 2020-02-29 NOTE — Progress Notes (Signed)
Initial Nutrition Assessment  DOCUMENTATION CODES:   Not applicable  INTERVENTION:   Recommend Ensure Enlive po BID, each supplement provides 350 kcal and 20 grams of protein  Recommend MVI daily   Low sodium diet education   NUTRITION DIAGNOSIS:   Unintentional weight loss related to social / environmental circumstances as evidenced by 17 percent weight loss in 8 months.  GOAL:   Patient will meet greater than or equal to 90% of their needs  MONITOR:   PO intake, Supplement acceptance, Labs, Weight trends, Skin, I & O's  REASON FOR ASSESSMENT:   Malnutrition Screening Tool    ASSESSMENT:   59 y.o. female with medical history significant of hypertension, hyperlipidemia, diabetes mellitus, schizophrenia, tobacco abuse, CKD stage IIIa, depression, who presents with low back pain and hypertension   Met with pt in room today. Pt reports good appetite and oral intake at baseline. Pt ate 75% of her breakfast today. Pt reports that she does not drink supplements at home but she is willing to try them in hospital. Per chart, pt down 21lbs(17%) over the past 8 months; this is significant. Pt is aware of weight loss but is unsure as to why she has lost weight. Recommend daily supplements after discharge. RD provided pt with low sodium diet education today. Pt to discharge; RD will order supplements if pt does not discharge.   Medications reviewed and include: aspirin, insulin, nicotine  Labs reviewed: BUN 24(H), creat 1.18(H) cbgs- 148, 137 x 24 hrs  NUTRITION - FOCUSED PHYSICAL EXAM:    Most Recent Value  Orbital Region No depletion  Upper Arm Region Mild depletion  Thoracic and Lumbar Region Mild depletion  Buccal Region No depletion  Temple Region No depletion  Clavicle Bone Region Mild depletion  Clavicle and Acromion Bone Region Mild depletion  Scapular Bone Region No depletion  Dorsal Hand Mild depletion  Patellar Region Moderate depletion  Anterior Thigh Region  Moderate depletion  Posterior Calf Region Moderate depletion  Edema (RD Assessment) Moderate  [BLE]  Hair Reviewed  Eyes Reviewed  Mouth Reviewed  Skin Reviewed  Nails Reviewed     Diet Order:   Diet Order            Diet - low sodium heart healthy           Diet heart healthy/carb modified Room service appropriate? Yes; Fluid consistency: Thin  Diet effective now                EDUCATION NEEDS:   Education needs have been addressed  Skin:  Skin Assessment: Reviewed RN Assessment  Last BM:  7/26  Height:   Ht Readings from Last 1 Encounters:  02/28/20 '5\' 2"'  (1.575 m)    Weight:   Wt Readings from Last 1 Encounters:  02/29/20 47.9 kg    Ideal Body Weight:  50 kg  BMI:  Body mass index is 19.31 kg/m.  Estimated Nutritional Needs:   Kcal:  1400-1600kcal/day  Protein:  70-80g/day  Fluid:  >1.4L/day  Koleen Distance MS, RD, LDN Please refer to [facility] for RD and/or RD on-call/weekend/after hours pager

## 2020-02-29 NOTE — Discharge Instructions (Signed)

## 2020-03-05 ENCOUNTER — Other Ambulatory Visit: Payer: Self-pay | Admitting: Urology

## 2020-03-06 LAB — MAG INTERPRETATION REFLEXED

## 2020-03-06 LAB — MAG IGM ANTIBODIES: MAG IgM Antibodies: <900 BTU (ref 0–999)

## 2020-03-07 ENCOUNTER — Ambulatory Visit
Admission: RE | Admit: 2020-03-07 | Discharge: 2020-03-07 | Disposition: A | Discharge status: Home / Self Care | Payer: Medicare HMO | Source: Ambulatory Visit | Attending: Urology | Admitting: Urology

## 2020-03-07 ENCOUNTER — Other Ambulatory Visit: Payer: Self-pay

## 2020-03-07 DIAGNOSIS — R3129 Other microscopic hematuria: Secondary | ICD-10-CM | POA: Insufficient documentation

## 2020-03-07 MED ORDER — IOHEXOL 300 MG/ML  SOLN
100.0000 mL | Freq: Once | INTRAMUSCULAR | Status: AC | PRN
  Administered 2020-03-07: 100 mL via INTRAVENOUS

## 2020-03-10 NOTE — Progress Notes (Signed)
03/11/2020  CC:  Chief Complaint  Patient presents with  . Cysto    HPI: Alicia Rogers is a 59 y.o. female with microscopic hematuria and urinary urge incontinence returns for a cystoscopy.   The patient was presented to the ED on 01/26/2020 for dysuria. Patient stated that she had been urinating more frequently and was concerned for infection. She was placed on oxybutynin by her regular physician which she stated did not work. She was switched to Enablex.   Today, she reports that she never filled this prescription as not been taking this medication.  She wonders why she urinates so much and has accidents.   Patient has a personal history of schizophrenia, hypertension, diabetes, and overactive bladder/incontinence. Unclear what medications she is on for diabetes.  She is a relatively poor historian.  She denies being on Lasix anymore however this prescription was recently filled.  CT hematuria work up on 03/07/20 showed no CT findings to explain hematuria. No evidence of urinary tract calculus, mass, or hydronephrosis. No filling defect on delayed phase imaging. Large burden of stool throughout the colon and rectum. Uterine fibroids.  She has ongoing urinary symptoms as outlined above.. She has not had any bowel issues. Reports multiple bowel movements daily since her CT scan.  She has an extensive smoking history.    Blood pressure (!) 153/68, pulse 81. NED. A&Ox3.   No respiratory distress   Abd soft, NT, ND Normal external genitalia with patent urethral meatus  Cystoscopy Procedure Note  Patient identification was confirmed, informed consent was obtained, and patient was prepped using Betadine solution.  Lidocaine jelly was administered per urethral meatus.    Procedure: - Flexible cystoscope introduced, without any difficulty.   - Thorough search of the bladder revealed:    normal urethral meatus    normal urothelium    no stones    no ulcers     no tumors    no  urethral polyps    no trabeculation  - Ureteral orifices were normal in position and appearance.  Post-Procedure: - Patient tolerated the procedure well  Pertinent image     CLINICAL DATA:  Microscopic hematuria  EXAM: CT ABDOMEN AND PELVIS WITHOUT AND WITH CONTRAST  TECHNIQUE: Multidetector CT imaging of the abdomen and pelvis was performed following the standard protocol before and following the bolus administration of intravenous contrast.  CONTRAST:  129mL OMNIPAQUE IOHEXOL 300 MG/ML  SOLN  COMPARISON:  None.  FINDINGS: Lower chest: No acute abnormality.  Hepatobiliary: No solid liver abnormality is seen. No gallstones, gallbladder wall thickening, or biliary dilatation.  Pancreas: Unremarkable. No pancreatic ductal dilatation or surrounding inflammatory changes.  Spleen: Normal in size without significant abnormality.  Adrenals/Urinary Tract: Multiple definitively benign fat attenuation bilateral adrenal adenomata. Kidneys are normal, without renal calculi, solid lesion, or hydronephrosis. No filling defect on delayed phase imaging. Bladder is unremarkable.  Stomach/Bowel: Stomach is within normal limits. Appendix appears normal. No evidence of bowel wall thickening, distention, or inflammatory changes. Large burden of stool throughout the colon and rectum.  Vascular/Lymphatic: Aortic atherosclerosis. No enlarged abdominal or pelvic lymph nodes.  Reproductive: Uterine fibroids.  Other: No abdominal wall hernia or abnormality. No abdominopelvic ascites.  Musculoskeletal: No acute or significant osseous findings.  IMPRESSION: 1. No CT findings to explain hematuria. No evidence of urinary tract calculus, mass, or hydronephrosis. No filling defect on delayed phase imaging. 2. Large burden of stool throughout the colon and rectum. 3. Uterine fibroids. 4. Aortic Atherosclerosis (ICD10-I70.0).  Electronically Signed   By: Eddie Candle  M.D.   On: 03/08/2020 12:26   I have personally reviewed the images and agree with radiologist interpretation.    Assessment/ Plan:  1. Urinary urge incontinence Bothersome urinary symptoms noted today. Patient did not try Enablex as prescribed, never filled prescription per her report No obvious underlying GU pathology on CT scan and cystoscopy -Trial toviaz 4 mg Rx sent.  2. Microscopic hematuria  Cystoscopy unremarkable.  CT hematuria was negative. Showed large burden of stool throughout the colon and rectum. Will have patient work on constipation which may improve alongside urinary symptoms.   Follow up in 4 months for reassessment.   Fransico Him, am acting as a scribe for Dr. Hollice Espy.  I have reviewed the above documentation for accuracy and completeness, and I agree with the above.   Hollice Espy, MD

## 2020-03-11 ENCOUNTER — Other Ambulatory Visit: Payer: Self-pay

## 2020-03-11 ENCOUNTER — Ambulatory Visit (INDEPENDENT_AMBULATORY_CARE_PROVIDER_SITE_OTHER): Payer: Medicare HMO | Admitting: Urology

## 2020-03-11 ENCOUNTER — Telehealth: Payer: Self-pay | Admitting: Urology

## 2020-03-11 ENCOUNTER — Encounter: Payer: Self-pay | Admitting: Urology

## 2020-03-11 VITALS — BP 153/68 | HR 81

## 2020-03-11 DIAGNOSIS — R3129 Other microscopic hematuria: Secondary | ICD-10-CM | POA: Diagnosis not present

## 2020-03-11 NOTE — Telephone Encounter (Signed)
Pt was a no-show for cysto this morning.  Just F.Y.I.

## 2020-03-11 NOTE — Discharge Summary (Signed)
Physician Discharge Summary  Alicia Rogers WYO:[id number] DOB: 1961/07/06 DOA: 02/28/2020  PCP: Marygrace Drought, MD  Admit date: 02/28/2020 Discharge date: 03/11/2020  Admitted From: home Disposition:  home  Recommendations for Outpatient Follow-up:  1. Follow up with PCP in 1-2 weeks 2. Please obtain BMP/CBC in one week 3. Please consider referral to urology for evaluation of microscopic hematuria. 4. Please follow up on patient's blood pressure.  Suspect presentation with hypertensive urgency due to noncompliance with medications.    Home Health: No Equipment/Devices: None  Discharge Condition: Stable CODE STATUS: Full  Diet recommendation: Heart Healthy / Carb Modified    Discharge Diagnoses: Principal Problem:   Hypertensive urgency Active Problems:   Back pain   CKD (chronic kidney disease), stage IIIa   Hypokalemia   Elevated troponin   Hematuria   Tobacco abuse   Dizziness   HLD (hyperlipidemia)   Type II diabetes mellitus with renal manifestations ([place])    Summary of HPI and Hospital Course:   HPI per Dr. Blaine Hamper 7/29: "Alicia Rogers is a 59 y.o. female with medical history significant of hypertension, hyperlipidemia, diabetes mellitus, schizophrenia, tobacco abuse, CKD stage IIIa, depression, who presents with low back pain.  Patient states that she has been having back pain in the past 3 days, which involves both upper and lower back, constant, severe, sharp, nonradiating.  It is aggravated by movement.  She states that she has dizziness today, but denies unilateral numbness or tingling in all extremities.  No facial droop or slurred speech.  Does not feel room spinning around her.  She has a mildly dry cough, mild shortness of breath, no chest pain.  Denies fever or chills.  She states that she noticed little blood in the urine yesterday.  She states sometimes that she has some mild dysuria and urinary frequency.  No burning on urination. Patient was found to  have elevated blood pressure 239/76, which improved with 221/80 after giving 10 mg of IV labetalol and 5 mg of hydralazine in ED.  ED Course: pt was found to have WBC 8.4, troponin XX 1, 22, 25, 32, negative urinalysis, pending COVID-19 PCR, slightly worsened renal function, potassium 3.3, temperature 99.4, heart rate 58, RR 18, oxygen saturation 98% on room air.  Chest x-ray negative.  CT head negative.  CT angiogram is negative for aortic dissection.  Patient is placed on progressive bed of observation  CT angiogram:  CT angiogram chest:  1. No thoracic aortic or great vessel aneurysm or dissection. There are foci of aortic atherosclerosis. There are foci of coronary artery calcification.  2. No demonstrable pulmonary embolus.  3. Centrilobular emphysematous change. Areas of mild atelectatic change. No edema or airspace opacity. No pleural effusions.  4. No evident thoracic adenopathy.  CT angiogram abdomen; CT angiogram pelvis:  1. There is aortic and proximal pelvic arterial atherosclerosis as noted. No aneurysm or dissection involving the aorta, major mesenteric, and major pelvic arterial vessels. No fibromuscular dysplasia evident.  2. Enlarged uterus, most likely containing leiomyomas.  3. No bowel obstruction. No abscess in the abdomen or pelvis. No periappendiceal region inflammation.  4. Apparent adenomas arising from the left adrenal.  5. Hepatic steatosis."   Hypertensive urgency - treated with IV labetaolol and IV hydralazine.  She was started on 10 mg amlodipine and home  Cozaar and HCTZ were held due to worsened renal function.  Renal function improved to baseline and these were resumed on discharge.  Mild elevation of hs-troponin on admission,  without chest pain or acute ischemic changes on EKG.  This is likely demand ischemia in setting of hypertensive urgency.    Back pain improved, no clear etiology but no alarm symptoms present.  This is  chronic.  Treated with PRN oxycodone and Robaxin.  Microscopic hematuria on UA.  Recommend outpatient urology referral for evaluation.  Dizziness - present on admission, resolved.  Likely due to HTN urgency.  CT head negative for acute findings.    Discharge Instructions   Discharge Instructions    Call MD for:  extreme fatigue   Complete by: As directed    Call MD for:  persistant dizziness or light-headedness   Complete by: As directed    Call MD for:  temperature >100.4   Complete by: As directed    Diet - low sodium heart healthy   Complete by: As directed    Discharge instructions   Complete by: As directed    Your blood pressure was dangerously high when you came to the hospital.   PLEASE make sure to take your medications exactly as prescribed every day. Uncontrolled BP puts you at risk of stroke, heart failure and other serious complications. Recommend you check your BP at home once daily, write them down, and bring to your primary care doctor visits. Make sure to eat LOW SODIUM diet as well.   Increase activity slowly   Complete by: As directed      Allergies as of 02/29/2020      Reactions   Penicillins    Rash      Medication List    TAKE these medications   Accu-Chek FastClix Lancets Misc   Accu-Chek SmartView test strip Generic drug: glucose blood   amLODipine 10 MG tablet Commonly known as: NORVASC Take 1 tablet (10 mg total) by mouth daily.   aspirin 81 MG EC tablet Take 1 tablet (81 mg total) by mouth daily. Swallow whole.   atorvastatin 80 MG tablet Commonly known as: LIPITOR Take 40 mg by mouth daily at 6 PM.   Fifty50 Glucose Meter 2.0 w/Device Kit Check blood sugar 3 times a day. Dx E11.9   hydrochlorothiazide 12.5 MG tablet Commonly known as: HYDRODIURIL Take 12.5 mg by mouth daily.   losartan 100 MG tablet Commonly known as: COZAAR Take 100 mg by mouth daily.   methocarbamol 500 MG tablet Commonly known as: ROBAXIN Take 1 tablet  (500 mg total) by mouth every 8 (eight) hours as needed for muscle spasms.   ReliOn Lancing Device Misc   sitaGLIPtin 100 MG tablet Commonly known as: JANUVIA Take 100 mg by mouth daily.       Allergies  Allergen Reactions  . Penicillins     Rash    Consultations:  None   Procedures/Studies: DG Chest 2 View  Result Date: 02/28/2020 CLINICAL DATA:  Lower extremity swelling EXAM: CHEST - 2 VIEW COMPARISON:  None. FINDINGS: The heart size and mediastinal contours are within normal limits. There is hyperinflation of the upper lung zones. No large airspace consolidation or pleural effusion. The visualized skeletal structures are unremarkable. IMPRESSION: No active cardiopulmonary disease. Electronically Signed   By: Prudencio Pair M.D.   On: 02/28/2020 00:38   CT HEAD WO CONTRAST  Result Date: 02/28/2020 CLINICAL DATA:  Dizziness. EXAM: CT HEAD WITHOUT CONTRAST TECHNIQUE: Contiguous axial images were obtained from the base of the skull through the vertex without intravenous contrast. COMPARISON:  November 11, 2004 FINDINGS: Brain: No evidence of acute infarction, hemorrhage, hydrocephalus,  extra-axial collection or mass lesion/mass effect. Vascular: No hyperdense vessel or unexpected calcification. Skull: Normal. Negative for fracture or focal lesion. Sinuses/Orbits: No acute finding. Other: None. IMPRESSION: No acute intracranial pathology. Electronically Signed   By: Virgina Norfolk M.D.   On: 02/28/2020 11:11   CT HEMATURIA WORKUP  Result Date: 03/08/2020 CLINICAL DATA:  Microscopic hematuria EXAM: CT ABDOMEN AND PELVIS WITHOUT AND WITH CONTRAST TECHNIQUE: Multidetector CT imaging of the abdomen and pelvis was performed following the standard protocol before and following the bolus administration of intravenous contrast. CONTRAST:  149m OMNIPAQUE IOHEXOL 300 MG/ML  SOLN COMPARISON:  None. FINDINGS: Lower chest: No acute abnormality. Hepatobiliary: No solid liver abnormality is seen. No  gallstones, gallbladder wall thickening, or biliary dilatation. Pancreas: Unremarkable. No pancreatic ductal dilatation or surrounding inflammatory changes. Spleen: Normal in size without significant abnormality. Adrenals/Urinary Tract: Multiple definitively benign fat attenuation bilateral adrenal adenomata. Kidneys are normal, without renal calculi, solid lesion, or hydronephrosis. No filling defect on delayed phase imaging. Bladder is unremarkable. Stomach/Bowel: Stomach is within normal limits. Appendix appears normal. No evidence of bowel wall thickening, distention, or inflammatory changes. Large burden of stool throughout the colon and rectum. Vascular/Lymphatic: Aortic atherosclerosis. No enlarged abdominal or pelvic lymph nodes. Reproductive: Uterine fibroids. Other: No abdominal wall hernia or abnormality. No abdominopelvic ascites. Musculoskeletal: No acute or significant osseous findings. IMPRESSION: 1. No CT findings to explain hematuria. No evidence of urinary tract calculus, mass, or hydronephrosis. No filling defect on delayed phase imaging. 2. Large burden of stool throughout the colon and rectum. 3. Uterine fibroids. 4. Aortic Atherosclerosis (ICD10-I70.0). Electronically Signed   By: AEddie CandleM.D.   On: 03/08/2020 12:26   CT Angio Chest/Abd/Pel for Dissection W and/or Wo Contrast  Result Date: 02/28/2020 CLINICAL DATA:  Hypertension with chest and abdominal pain EXAM: CT ANGIOGRAPHY CHEST, ABDOMEN AND PELVIS TECHNIQUE: Non-contrast CT of the chest was initially obtained. Multidetector CT imaging through the chest, abdomen and pelvis was performed using the standard protocol during bolus administration of intravenous contrast. Multiplanar reconstructed images and MIPs were obtained and reviewed to evaluate the vascular anatomy. CONTRAST:  731mOMNIPAQUE IOHEXOL 350 MG/ML SOLN COMPARISON:  Chest radiograph February 28, 2020. FINDINGS: CTA CHEST FINDINGS Cardiovascular: There is no intramural  hematoma evident on noncontrast enhanced study. There is no appreciable thoracic aortic aneurysm or dissection. Visualized great vessels appear normal. No aneurysm or dissection involving these vessels. Note that the right innominate and left common carotid arteries arise as a common trunk, an anatomic variant. There is no appreciable aortic plaque seen in the visualized great vessels. There are foci of peripheral thoracic aortic calcification consistent with atherosclerosis. No hemodynamically significant narrowing noted in the thoracic aorta. There are foci of coronary artery calcification present. No pericardial effusion or pericardial thickening is evident. No pulmonary embolus is appreciable on this study. Mediastinum/Nodes: Thyroid is mildly inhomogeneous. There is a 6 mm nodular opacity in the left lobe of the thyroid which per consensus guidelines does not warrant additional imaging surveillance. A 6 mm nodular opacity also noted in the right lobe of the thyroid, again not warranting additional imaging surveillance. There is no appreciable thoracic adenopathy. No esophageal lesions are evident. Lungs/Pleura: There is a degree of underlying centrilobular emphysematous change. There are areas of mild atelectasis. There is no evident edema or airspace opacity. No pleural effusions are appreciable. Musculoskeletal: No fracture or dislocation is demonstrable. No blastic or lytic bone lesions. No evident chest wall lesions. Review of the MIP images  confirms the above findings. CTA ABDOMEN AND PELVIS FINDINGS VASCULAR Aorta: There is no appreciable abdominal aortic aneurysm or dissection. There is atherosclerotic plaque throughout the mid and distal aspects of the aorta with irregular plaque in the distal aorta causing approximately 50% diameter stenosis in the distal most aspect of the aorta. Moderate calcified plaque is noted in this area as well as areas of decreased attenuation plaque. No well-defined  ulceration seen in this region by CT. Celiac: The celiac artery and its branches are widely patent. No aneurysm or dissection involving these vessels. SMA: Superior mesenteric artery and its branches are widely patent. No aneurysm or dissection involving these vessels. Renals: There is a single renal artery on the right with a branch arising from the proximal main renal artery on the right. There is a main renal artery on the left with an accessory renal artery branch arising from the aorta which supplies the upper pole the left kidney. Renal arteries and their respective branches appear patent. No aneurysm or dissection involving these vessels. No evident fibromuscular dysplasia. IMA: The inferior mesenteric artery and its branches are patent. No aneurysm or dissection involving these vessels evident. Inflow: No aneurysm or dissection involving the pelvic arterial vessels. There is moderate calcification in the proximal common iliac arteries bilaterally with areas of 50-60% diameter stenosis in each proximal common iliac artery. Calcification is noted in each proximal to mid hypogastric artery. There is no appreciable narrowing of either external iliac artery. There is mild calcified plaque in the distal right and left common femoral arteries without hemodynamically significant stenosis. The proximal profunda femoral and superficial femoral arteries are patent bilaterally. Veins: No obvious venous abnormality within the limitations of this arterial phase study. Review of the MIP images confirms the above findings. NON-VASCULAR Hepatobiliary: There is a degree of hepatic steatosis. No focal liver lesions evident in the liver on arterial phase imaging. Gallbladder wall is not appreciably thickened. There is no biliary duct dilatation. Pancreas: No evident pancreatic mass or inflammatory focus. Spleen: No splenic lesions are evident. Adrenals/Urinary Tract: Right adrenal appears normal. There is a left adrenal mass  measuring 1.9 x 1.8 cm which is felt to be consistent with an adenoma based on attenuation criteria. An apparent second adrenal adenoma on the left measures 1.2 x 1.2 cm. There is a 1 x 1 cm cyst arising from the upper pole the left kidney. There is no appreciable hydronephrosis on either side. There is no evident renal or ureteral calculus on either side. Urinary bladder is midline with wall thickness within normal limits. Stomach/Bowel: There is no appreciable bowel wall or mesenteric thickening. There is no evident bowel obstruction. The terminal ileum appears normal. No evident free air or portal venous air. Lymphatic: No adenopathy is appreciable in the abdomen or pelvis. Reproductive: The uterus is midline. The uterus is enlarged with suspected diffuse leiomyomatous change. No extrauterine pelvic mass. Other: No periappendiceal region inflammation. No abscess or ascites is appreciable in the abdomen or pelvis. Musculoskeletal: No blastic or lytic bone lesions. No intramuscular or abdominal wall lesions are evident. Review of the MIP images confirms the above findings. IMPRESSION: CT angiogram chest: 1. No thoracic aortic or great vessel aneurysm or dissection. There are foci of aortic atherosclerosis. There are foci of coronary artery calcification. 2.  No demonstrable pulmonary embolus. 3. Centrilobular emphysematous change. Areas of mild atelectatic change. No edema or airspace opacity. No pleural effusions. 4.  No evident thoracic adenopathy. CT angiogram abdomen; CT angiogram pelvis: 1.  There is aortic and proximal pelvic arterial atherosclerosis as noted. No aneurysm or dissection involving the aorta, major mesenteric, and major pelvic arterial vessels. No fibromuscular dysplasia evident. 2.  Enlarged uterus, most likely containing leiomyomas. 3. No bowel obstruction. No abscess in the abdomen or pelvis. No periappendiceal region inflammation. 4.  Apparent adenomas arising from the left adrenal. 5.   Hepatic steatosis. Aortic Atherosclerosis (ICD10-I70.0) and Emphysema (ICD10-J43.9). Electronically Signed   By: Lowella Grip III M.D.   On: 02/28/2020 07:32       Subjective: Patient seen this AM.  Reports feeling better.  Denies back pain at this time, but says has not been up out of bed.  Agreeable to work with PT for evaluation.  No chest pain, SOB, F/C or other complaints.    Discharge Exam: Vitals:   02/29/20 1029 02/29/20 1126  BP: (!) 158/63 (!) 157/59  Pulse: 63 65  Resp: 18 18  Temp: 98.3 F (36.8 C) 98.1 F (36.7 C)  SpO2: 100% 99%   Vitals:   02/29/20 0600 02/29/20 0751 02/29/20 1029 02/29/20 1126  BP:  (!) 174/65 (!) 158/63 (!) 157/59  Pulse:  67 63 65  Resp: '14 18 18 18  ' Temp:  98.4 F (36.9 C) 98.3 F (36.8 C) 98.1 F (36.7 C)  TempSrc:  Oral    SpO2:  96% 100% 99%  Weight:      Height:        General: Pt is alert, awake, not in acute distress Cardiovascular: RRR, S1/S2 +, no rubs, no gallops Respiratory: CTA bilaterally, no wheezing, no rhonchi Abdominal: Soft, NT, ND, bowel sounds + Extremities: no edema, no cyanosis    The results of significant diagnostics from this hospitalization (including imaging, microbiology, ancillary and laboratory) are listed below for reference.     Microbiology: No results found for this or any previous visit (from the past 240 hour(s)).   Labs: BNP (last 3 results) Recent Labs    02/28/20 1224  BNP 144.8*   Basic Metabolic Panel: No results for input(s): NA, K, CL, CO2, GLUCOSE, BUN, CREATININE, CALCIUM, MG, PHOS in the last 168 hours. Liver Function Tests: No results for input(s): AST, ALT, ALKPHOS, BILITOT, PROT, ALBUMIN in the last 168 hours. No results for input(s): LIPASE, AMYLASE in the last 168 hours. No results for input(s): AMMONIA in the last 168 hours. CBC: No results for input(s): WBC, NEUTROABS, HGB, HCT, MCV, PLT in the last 168 hours. Cardiac Enzymes: No results for input(s): CKTOTAL,  CKMB, CKMBINDEX, TROPONINI in the last 168 hours. BNP: Invalid input(s): POCBNP CBG: No results for input(s): GLUCAP in the last 168 hours. D-Dimer No results for input(s): DDIMER in the last 72 hours. Hgb A1c No results for input(s): HGBA1C in the last 72 hours. Lipid Profile No results for input(s): CHOL, HDL, LDLCALC, TRIG, CHOLHDL, LDLDIRECT in the last 72 hours. Thyroid function studies No results for input(s): TSH, T4TOTAL, T3FREE, THYROIDAB in the last 72 hours.  Invalid input(s): FREET3 Anemia work up No results for input(s): VITAMINB12, FOLATE, FERRITIN, TIBC, IRON, RETICCTPCT in the last 72 hours. Urinalysis    Component Value Date/Time   COLORURINE STRAW (A) 02/28/2020 1208   APPEARANCEUR CLEAR (A) 02/28/2020 1208   APPEARANCEUR Cloudy (A) 02/19/2020 1129   LABSPEC 1.027 02/28/2020 1208   PHURINE 6.0 02/28/2020 1208   GLUCOSEU 150 (A) 02/28/2020 1208   HGBUR NEGATIVE 02/28/2020 1208   BILIRUBINUR NEGATIVE 02/28/2020 1208   BILIRUBINUR Negative 02/19/2020 1129   KETONESUR  NEGATIVE 02/28/2020 1208   PROTEINUR 100 (A) 02/28/2020 1208   NITRITE NEGATIVE 02/28/2020 1208   LEUKOCYTESUR NEGATIVE 02/28/2020 1208   Sepsis Labs Invalid input(s): PROCALCITONIN,  WBC,  LACTICIDVEN Microbiology No results found for this or any previous visit (from the past 240 hour(s)).   Time coordinating discharge: Over 30 minutes  SIGNED:   Ezekiel Slocumb, DO [facility] 03/11/2020, 5:29 PM   If 7PM-7AM, please contact night-coverage [email or web address]

## 2020-03-12 LAB — URINALYSIS, COMPLETE
Bilirubin, UA: NEGATIVE
Glucose, UA: NEGATIVE
Ketones, UA: NEGATIVE
Leukocytes,UA: NEGATIVE
Nitrite, UA: NEGATIVE
Specific Gravity, UA: >1.03 — ABNORMAL HIGH (ref 1.005–1.030)
Urobilinogen, Ur: 0.2 mg/dL (ref 0.2–1.0)
pH, UA: 5.5 (ref 5.0–7.5)

## 2020-03-12 LAB — MICROSCOPIC EXAMINATION: Epithelial Cells (non renal): >10 /hpf — AB (ref 0–10)

## 2020-04-13 ENCOUNTER — Other Ambulatory Visit: Payer: Self-pay

## 2020-04-13 ENCOUNTER — Emergency Department: Payer: Medicare HMO

## 2020-04-13 DIAGNOSIS — Z7982 Long term (current) use of aspirin: Secondary | ICD-10-CM | POA: Diagnosis not present

## 2020-04-13 DIAGNOSIS — Z79899 Other long term (current) drug therapy: Secondary | ICD-10-CM | POA: Diagnosis not present

## 2020-04-13 DIAGNOSIS — I129 Hypertensive chronic kidney disease with stage 1 through stage 4 chronic kidney disease, or unspecified chronic kidney disease: Secondary | ICD-10-CM | POA: Insufficient documentation

## 2020-04-13 DIAGNOSIS — F17210 Nicotine dependence, cigarettes, uncomplicated: Secondary | ICD-10-CM | POA: Diagnosis not present

## 2020-04-13 DIAGNOSIS — N1831 Chronic kidney disease, stage 3a: Secondary | ICD-10-CM | POA: Insufficient documentation

## 2020-04-13 DIAGNOSIS — E1122 Type 2 diabetes mellitus with diabetic chronic kidney disease: Secondary | ICD-10-CM | POA: Insufficient documentation

## 2020-04-13 DIAGNOSIS — Z7984 Long term (current) use of oral hypoglycemic drugs: Secondary | ICD-10-CM | POA: Insufficient documentation

## 2020-04-13 DIAGNOSIS — R42 Dizziness and giddiness: Secondary | ICD-10-CM | POA: Diagnosis present

## 2020-04-13 LAB — BASIC METABOLIC PANEL
Anion gap: 11 (ref 5–15)
BUN: 19 mg/dL (ref 6–20)
CO2: 27 mmol/L (ref 22–32)
Calcium: 9 mg/dL (ref 8.9–10.3)
Chloride: 105 mmol/L (ref 98–111)
Creatinine, Ser: 1.13 mg/dL — ABNORMAL HIGH (ref 0.44–1.00)
GFR calc Af Amer: >60 mL/min (ref >60)
GFR calc non Af Amer: 53 mL/min — ABNORMAL LOW (ref >60)
Glucose, Bld: 84 mg/dL (ref 70–99)
Potassium: 3.7 mmol/L (ref 3.5–5.1)
Sodium: 143 mmol/L (ref 135–145)

## 2020-04-13 LAB — URINALYSIS, COMPLETE (UACMP) WITH MICROSCOPIC
Bilirubin Urine: NEGATIVE
Glucose, UA: 150 mg/dL — AB
Hgb urine dipstick: NEGATIVE
Ketones, ur: NEGATIVE mg/dL
Leukocytes,Ua: NEGATIVE
Nitrite: NEGATIVE
Protein, ur: >=300 mg/dL — AB
Specific Gravity, Urine: 1.016 (ref 1.005–1.030)
pH: 5 (ref 5.0–8.0)

## 2020-04-13 LAB — CBC
HCT: 36.4 % (ref 36.0–46.0)
Hemoglobin: 11.8 g/dL — ABNORMAL LOW (ref 12.0–15.0)
MCH: 27.4 pg (ref 26.0–34.0)
MCHC: 32.4 g/dL (ref 30.0–36.0)
MCV: 84.5 fL (ref 80.0–100.0)
Platelets: 288 10*3/uL (ref 150–400)
RBC: 4.31 MIL/uL (ref 3.87–5.11)
RDW: 15 % (ref 11.5–15.5)
WBC: 7.5 10*3/uL (ref 4.0–10.5)
nRBC: 0 % (ref 0.0–0.2)

## 2020-04-13 LAB — TROPONIN I (HIGH SENSITIVITY): Troponin I (High Sensitivity): 27 ng/L — ABNORMAL HIGH (ref <18)

## 2020-04-13 NOTE — ED Notes (Signed)
Pt called for triage x 3, no answer at this time. Pt not visualized in waiting at this time.

## 2020-04-13 NOTE — ED Notes (Signed)
This RN notified by X-ray that patient refusing chest X-ray, multiple staff members attempted to speak with patient regarding importance of chest x-ray patient refusing until she speaks with EDP.

## 2020-04-13 NOTE — ED Notes (Signed)
Pt ambulatory with steady gait back to desk, pt now states that she has not been called for triage. This RN explained to patient that she was called multiple times for triage and did not answer. Pt continues to have steady gait with NAD noted at this time.

## 2020-04-13 NOTE — ED Notes (Signed)
First RN Note: Pt presents to ED with c/o dizziness and syncope. Pt has been visualized walking around lobby and refusing to check in as a patient since approx 9-10am today. Pt visualized eating [employer] without difficulty, visualized drinking pepsi. Pt visualized ambulatory with steady gait, pt initially refusing to check in due to being unable to be told the wait times in the lobby, pt now requesting to check in and be seen as a patient.

## 2020-04-13 NOTE — ED Triage Notes (Signed)
Pt states she got dizzy and fell on the floor. Pt states she got up and got something to drink. Pt states she did not check her blood sugar

## 2020-04-14 ENCOUNTER — Emergency Department: Payer: Medicare HMO

## 2020-04-14 ENCOUNTER — Emergency Department
Admission: EM | Admit: 2020-04-14 | Discharge: 2020-04-14 | Disposition: A | Discharge status: Home / Self Care | Payer: Medicare HMO | Attending: Emergency Medicine | Admitting: Emergency Medicine

## 2020-04-14 ENCOUNTER — Encounter: Payer: Self-pay | Admitting: Radiology

## 2020-04-14 DIAGNOSIS — I10 Essential (primary) hypertension: Secondary | ICD-10-CM

## 2020-04-14 LAB — TROPONIN I (HIGH SENSITIVITY)
Troponin I (High Sensitivity): 32 ng/L — ABNORMAL HIGH
Troponin I (High Sensitivity): 32 ng/L — ABNORMAL HIGH (ref <18)

## 2020-04-14 LAB — GLUCOSE, CAPILLARY: Glucose-Capillary: 128 mg/dL — ABNORMAL HIGH (ref 70–99)

## 2020-04-14 MED ORDER — IOHEXOL 350 MG/ML SOLN
75.0000 mL | Freq: Once | INTRAVENOUS | Status: AC | PRN
  Administered 2020-04-14: 75 mL via INTRAVENOUS

## 2020-04-14 MED ORDER — NITROGLYCERIN 2 % TD OINT: 1.0000 [in_us] | TOPICAL_OINTMENT | Freq: Once | TRANSDERMAL | Status: DC

## 2020-04-14 MED ORDER — AMLODIPINE BESYLATE 5 MG PO TABS
10.0000 mg | ORAL_TABLET | Freq: Once | ORAL | Status: AC
  Administered 2020-04-14: 10 mg via ORAL
  Filled 2020-04-14: qty 2

## 2020-04-14 MED ORDER — CLONIDINE HCL 0.1 MG PO TABS
0.1000 mg | ORAL_TABLET | Freq: Once | ORAL | Status: AC
  Administered 2020-04-14: 0.1 mg via ORAL
  Filled 2020-04-14: qty 1

## 2020-04-14 NOTE — ED Notes (Signed)
Pt to CT at this time.

## 2020-04-14 NOTE — ED Notes (Signed)
This RN notified by patient's sister she is here to pick sister up. Explained to sister will discharge sister ASAP.

## 2020-04-14 NOTE — ED Notes (Signed)
This RN to bedside, pt visualized walking around the room in NAD, this RN received verbal permission from patient to call sister to arrange transport. This RN spoke with patient's sister [name] and notified of pending discharge, states is able to provide patient with a ride home.

## 2020-04-14 NOTE — ED Notes (Signed)
Verbal consent for D/C obtained by this RN. Pt refusing to take her D/C papers, refusing to allow this RN to give her D/C papers to her sister. Pt ambulatory with steady gait to the lobby. Pt visualized in NAD at time of D/C.

## 2020-04-14 NOTE — ED Provider Notes (Signed)
[facility] Emergency Department Provider Note  ____________________________________________   First MD Initiated Contact with Patient 04/14/20 0125     (approximate)  I have reviewed the triage vital signs and the nursing notes.   HISTORY  Chief Complaint Dizziness   HPI Alicia Rogers is a 59 y.o. female with below list of previous medical conditions including hypertension diabetes schizophrenia presents to the emergency department secondary to dizziness resulted in the patient falling on the floor.  Patient denies any headache during the fall.  Patient states that she subsequently stood up and had something to drink with resolution of symptoms.  Patient states that she did not check her glucose at that time.  Patient denies any complaints at present.        Past Medical History:  Diagnosis Date  . Depression   . Diabetes mellitus without complication ([place])   . Diabetes mellitus, type II ([place])   . Hypertension   . Schizophrenia [facility])     Patient Active Problem List   Diagnosis Date Noted  . Hypertensive urgency 02/28/2020  . Back pain 02/28/2020  . CKD (chronic kidney disease), stage IIIa 02/28/2020  . Hypokalemia 02/28/2020  . Elevated troponin 02/28/2020  . Hematuria 02/28/2020  . Tobacco abuse 02/28/2020  . Dizziness 02/28/2020  . HLD (hyperlipidemia) 02/28/2020  . Type II diabetes mellitus with renal manifestations ([place]) 02/28/2020  . Compulsive tobacco user syndrome 10/11/2014  . BP (high blood pressure) 09/04/2014  . 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  . Claudication ([place]) 06/09/2014  . Schizoaffective disorder, depressive type ([place]) 06/09/2014  . Diabetes mellitus, type 2 ([place]) 06/09/2014  . Type 2 diabetes mellitus ([place]) 06/09/2014  . Controlled type 2 diabetes mellitus without complication ([place]) 95/18/8416    History reviewed. No pertinent surgical history.  Prior to Admission medications     Medication Sig Start Date End Date Taking? Authorizing Provider  ACCU-CHEK FASTCLIX LANCETS [place]  02/17/15   [provider]  ACCU-CHEK SMARTVIEW test strip  04/02/15   [provider]  amLODipine (NORVASC) 10 MG tablet Take 1 tablet (10 mg total) by mouth daily. 03/01/20   Ezekiel Slocumb, DO  aspirin EC 81 MG EC tablet Take 1 tablet (81 mg total) by mouth daily. Swallow whole. 03/01/20   Ezekiel Slocumb, DO  atorvastatin (LIPITOR) 80 MG tablet Take 40 mg by mouth daily at 6 PM.  08/20/15   [provider]  Blood Glucose Monitoring Suppl (FIFTY50 GLUCOSE METER 2.0) W/DEVICE KIT Check blood sugar 3 times a day. Dx E11.9 07/04/14   [provider]  hydrochlorothiazide (HYDRODIURIL) 12.5 MG tablet Take 12.5 mg by mouth daily. 12/06/19   [provider]  Lancet Devices (RELION LANCING DEVICE) [place]  05/09/14   [provider]  losartan (COZAAR) 100 MG tablet Take 100 mg by mouth daily.    [provider]  methocarbamol (ROBAXIN) 500 MG tablet Take 1 tablet (500 mg total) by mouth every 8 (eight) hours as needed for muscle spasms. 02/29/20   Nicole Kindred A, DO  sitaGLIPtin (JANUVIA) 100 MG tablet Take 100 mg by mouth daily.    [provider]  traZODone (DESYREL) 150 MG tablet Take 1 tablet (150 mg total) by mouth at bedtime as needed for sleep. Patient not taking: Reported on 02/21/2017 06/08/16 01/26/20  Elvin So, MD    Allergies Penicillins  Family History  Problem Relation Age of Onset  . Diabetes Mother   . Glaucoma  Mother   . Heart disease Mother   . Heart attack Mother   . Depression Mother   . Hypertension Mother   . Diabetes Father   . Glaucoma Father   . Hypertension Father   . Diabetes Sister   . Depression Sister   . Post-traumatic stress disorder Sister     Social History Social History   Tobacco Use  . Smoking status: Current Every Day Smoker    Packs/day: 1.00    Types: Cigarettes    Start  date: 04/21/1978  . Smokeless tobacco: Never Used  Vaping Use  . Vaping Use: Never used  Substance Use Topics  . Alcohol use: No    Alcohol/week: 0.0 standard drinks  . Drug use: No    Review of Systems Constitutional: No fever/chills Eyes: No visual changes. ENT: No sore throat. Cardiovascular: Denies chest pain. Respiratory: Denies shortness of breath. Gastrointestinal: No abdominal pain.  No nausea, no vomiting.  No diarrhea.  No constipation. Genitourinary: Negative for dysuria. Musculoskeletal: Negative for neck pain.  Negative for back pain. Integumentary: Negative for rash. Neurological: Negative for headaches, focal weakness or numbness.  Positive for dizziness now resolved  ____________________________________________   PHYSICAL EXAM:  VITAL SIGNS: ED Triage Vitals  Enc Vitals Group     BP 04/13/20 1658 (!) 185/82     Pulse Rate 04/13/20 1658 71     Resp 04/13/20 1658 18     Temp 04/13/20 1658 98.9 F (37.2 C)     Temp src --      SpO2 04/13/20 1658 100 %     Weight 04/13/20 1659 50.8 kg (112 lb)     Height 04/13/20 1659 1.575 m ('5\' 2"' )     Head Circumference --      Peak Flow --      Pain Score 04/13/20 1659 0     Pain Loc --      Pain Edu? --      Excl. in [place]? --     Constitutional: Alert and oriented.  Eyes: Conjunctivae are normal.  Head: Atraumatic. Mouth/Throat: Patient is wearing a mask. Neck: No stridor.  No meningeal signs.   Cardiovascular: Normal rate, regular rhythm. Good peripheral circulation. Grossly normal heart sounds. Respiratory: Normal respiratory effort.  No retractions. Gastrointestinal: Soft and nontender. No distention.  Musculoskeletal: No lower extremity tenderness nor edema. No gross deformities of extremities. Neurologic:  Normal speech and language. No gross focal neurologic deficits are appreciated.  Skin:  Skin is warm, dry and intact. Psychiatric: Mood and affect are normal. Speech and behavior are  normal.  ____________________________________________   LABS (all labs ordered are listed, but only abnormal results are displayed)  Labs Reviewed  BASIC METABOLIC PANEL - Abnormal; Notable for the following components:      Result Value   Creatinine, Ser 1.13 (*)    GFR calc non Af Amer 53 (*)    All other components within normal limits  CBC - Abnormal; Notable for the following components:   Hemoglobin 11.8 (*)    All other components within normal limits  URINALYSIS, COMPLETE (UACMP) WITH MICROSCOPIC - Abnormal; Notable for the following components:   Color, Urine YELLOW (*)    APPearance CLEAR (*)    Glucose, UA 150 (*)    Protein, ur >=300 (*)    Bacteria, UA RARE (*)    All other components within normal limits  GLUCOSE, CAPILLARY - Abnormal; Notable for the following components:   Glucose-Capillary 128 (*)  All other components within normal limits  TROPONIN I (HIGH SENSITIVITY) - Abnormal; Notable for the following components:   Troponin I (High Sensitivity) 27 (*)    All other components within normal limits  TROPONIN I (HIGH SENSITIVITY) - Abnormal; Notable for the following components:   Troponin I (High Sensitivity) 32 (*)    All other components within normal limits  TROPONIN I (HIGH SENSITIVITY) - Abnormal; Notable for the following components:   Troponin I (High Sensitivity) 32 (*)    All other components within normal limits  CBG MONITORING, ED  CBG MONITORING, ED   ____________________________________________  EKG  ED ECG REPORT I, [place] N Sharrie Self, the attending physician, personally viewed and interpreted this ECG.   Date: 04/13/2020  EKG Time: 5:12 PM  Rate: 60  Rhythm: Normal sinus rhythm  Axis: Normal  Intervals: Normal  ST&T Change: None  ____________________________________________  RADIOLOGY I, Merriam N Landyn Buckalew, personally viewed and evaluated these images (plain radiographs) as part of my medical decision making, as well as  reviewing the written report by the radiologist.  ED MD interpretation: Negative CT angiogram of the head and neck per radiologist.  Official radiology report(s): CT Angio Head W or Wo Contrast  Result Date: 04/14/2020 CLINICAL DATA:  Initial evaluation for acute dizziness, syncope. EXAM: CT ANGIOGRAPHY HEAD AND NECK TECHNIQUE: Multidetector CT imaging of the head and neck was performed using the standard protocol during bolus administration of intravenous contrast. Multiplanar CT image reconstructions and MIPs were obtained to evaluate the vascular anatomy. Carotid stenosis measurements (when applicable) are obtained utilizing NASCET criteria, using the distal internal carotid diameter as the denominator. CONTRAST:  3m OMNIPAQUE IOHEXOL 350 MG/ML SOLN COMPARISON:  Prior head CT from 02/28/2020. FINDINGS: CT HEAD FINDINGS Brain: Cerebral volume within normal limits for patient age. No evidence for acute intracranial hemorrhage. No findings to suggest acute large vessel territory infarct. No mass lesion, midline shift, or mass effect. Ventricles are normal in size without evidence for hydrocephalus. No extra-axial fluid collection identified. Vascular: No hyperdense vessel identified. Skull: Scalp soft tissues demonstrate no acute abnormality. Calvarium intact. Sinuses/Orbits: Globes and orbital soft tissues within normal limits. Visualized paranasal sinuses are clear. No mastoid effusion. CTA NECK FINDINGS Aortic arch: Visualized aortic arch normal in caliber. Bovine branching pattern noted. Mild atheromatous change about the origin of the great vessels without hemodynamically significant stenosis. Right carotid system: Right common and internal carotid arteries patent without stenosis, dissection or occlusion. Left carotid system: Left CCA patent from its origin to the bifurcation without stenosis. Eccentric calcified plaque at the left bifurcation with associated short-segment stenosis of up to  approximately 50% by NASCET criteria. Left ICA patent distally to the skull base without stenosis, dissection or occlusion. Vertebral arteries: Both vertebral arteries arise from the subclavian arteries. No proximal subclavian artery stenosis. Vertebral arteries widely patent within the neck without stenosis, dissection or occlusion. Skeleton: No acute osseous abnormality. No worrisome osseous lesions. Moderate multilevel cervical spondylosis noted. Patient is edentulous. Other neck: No other acute soft tissue abnormality within the neck. No adenopathy. Multiple scattered thyroid nodules noted, largest of which measures 8 mm on the right. These are felt to be of doubtful significance given size and patient age. No follow-up imaging recommended regarding these lesions. Upper chest: Visualized upper chest demonstrates no acute finding. Upper lobe predominant centrilobular emphysema. Review of the MIP images confirms the above findings CTA HEAD FINDINGS Anterior circulation: Examination of the intracranial circulation mildly limited by motion. Petrous segments widely  patent. Mild atheromatous change within the right carotid siphon with no more than mild stenosis. Carotid siphons otherwise widely patent. A1 segments patent bilaterally. Normal anterior communicating artery complex. Inter psoas widely patent their distal aspects without stenosis. No M1 stenosis or occlusion. Normal MCA bifurcations. Distal MCA branches well perfused and symmetric. Posterior circulation: Both vertebral arteries widely patent to the vertebrobasilar junction without stenosis. Both picas grossly patent at their origins. Short fenestration at the vertebrobasilar junction noted. Basilar widely patent distally without stenosis. Superior cerebellar and posterior cerebral arteries widely patent bilaterally. Venous sinuses: Patent. Anatomic variants: None significant.  No intracranial aneurysm. Review of the MIP images confirms the above findings  IMPRESSION: CT HEAD IMPRESSION: Negative head CT.  No acute intracranial abnormality identified. CTA HEAD AND NECK IMPRESSION: 1. Negative CTA for large vessel occlusion. 2. 50% atheromatous stenosis at the left carotid bifurcation. 3. No other hemodynamically significant or correctable stenosis within the major arterial vasculature of the head and neck. 4. Emphysema (ICD10-J43.9). Electronically Signed   By: Jeannine Boga M.D.   On: 04/14/2020 04:22   CT Angio Neck W and/or Wo Contrast  Result Date: 04/14/2020 CLINICAL DATA:  Initial evaluation for acute dizziness, syncope. EXAM: CT ANGIOGRAPHY HEAD AND NECK TECHNIQUE: Multidetector CT imaging of the head and neck was performed using the standard protocol during bolus administration of intravenous contrast. Multiplanar CT image reconstructions and MIPs were obtained to evaluate the vascular anatomy. Carotid stenosis measurements (when applicable) are obtained utilizing NASCET criteria, using the distal internal carotid diameter as the denominator. CONTRAST:  28m OMNIPAQUE IOHEXOL 350 MG/ML SOLN COMPARISON:  Prior head CT from 02/28/2020. FINDINGS: CT HEAD FINDINGS Brain: Cerebral volume within normal limits for patient age. No evidence for acute intracranial hemorrhage. No findings to suggest acute large vessel territory infarct. No mass lesion, midline shift, or mass effect. Ventricles are normal in size without evidence for hydrocephalus. No extra-axial fluid collection identified. Vascular: No hyperdense vessel identified. Skull: Scalp soft tissues demonstrate no acute abnormality. Calvarium intact. Sinuses/Orbits: Globes and orbital soft tissues within normal limits. Visualized paranasal sinuses are clear. No mastoid effusion. CTA NECK FINDINGS Aortic arch: Visualized aortic arch normal in caliber. Bovine branching pattern noted. Mild atheromatous change about the origin of the great vessels without hemodynamically significant stenosis. Right  carotid system: Right common and internal carotid arteries patent without stenosis, dissection or occlusion. Left carotid system: Left CCA patent from its origin to the bifurcation without stenosis. Eccentric calcified plaque at the left bifurcation with associated short-segment stenosis of up to approximately 50% by NASCET criteria. Left ICA patent distally to the skull base without stenosis, dissection or occlusion. Vertebral arteries: Both vertebral arteries arise from the subclavian arteries. No proximal subclavian artery stenosis. Vertebral arteries widely patent within the neck without stenosis, dissection or occlusion. Skeleton: No acute osseous abnormality. No worrisome osseous lesions. Moderate multilevel cervical spondylosis noted. Patient is edentulous. Other neck: No other acute soft tissue abnormality within the neck. No adenopathy. Multiple scattered thyroid nodules noted, largest of which measures 8 mm on the right. These are felt to be of doubtful significance given size and patient age. No follow-up imaging recommended regarding these lesions. Upper chest: Visualized upper chest demonstrates no acute finding. Upper lobe predominant centrilobular emphysema. Review of the MIP images confirms the above findings CTA HEAD FINDINGS Anterior circulation: Examination of the intracranial circulation mildly limited by motion. Petrous segments widely patent. Mild atheromatous change within the right carotid siphon with no more than mild stenosis.  Carotid siphons otherwise widely patent. A1 segments patent bilaterally. Normal anterior communicating artery complex. Inter psoas widely patent their distal aspects without stenosis. No M1 stenosis or occlusion. Normal MCA bifurcations. Distal MCA branches well perfused and symmetric. Posterior circulation: Both vertebral arteries widely patent to the vertebrobasilar junction without stenosis. Both picas grossly patent at their origins. Short fenestration at the  vertebrobasilar junction noted. Basilar widely patent distally without stenosis. Superior cerebellar and posterior cerebral arteries widely patent bilaterally. Venous sinuses: Patent. Anatomic variants: None significant.  No intracranial aneurysm. Review of the MIP images confirms the above findings IMPRESSION: CT HEAD IMPRESSION: Negative head CT.  No acute intracranial abnormality identified. CTA HEAD AND NECK IMPRESSION: 1. Negative CTA for large vessel occlusion. 2. 50% atheromatous stenosis at the left carotid bifurcation. 3. No other hemodynamically significant or correctable stenosis within the major arterial vasculature of the head and neck. 4. Emphysema (ICD10-J43.9). Electronically Signed   By: Jeannine Boga M.D.   On: 04/14/2020 04:22      Procedures   ____________________________________________   INITIAL IMPRESSION / MDM / [place] / ED COURSE  As part of my medical decision making, I reviewed the following data within the electronic MEDICAL RECORD NUMBER   59 year old female presented with above-stated history and physical exam secondary to dizziness and hypertension.  Patient without any complaints at present laboratory data unremarkable  ____________________________________________  FINAL CLINICAL IMPRESSION(S) / ED DIAGNOSES  Final diagnoses:  Essential hypertension     MEDICATIONS GIVEN DURING THIS VISIT:  Medications  cloNIDine (CATAPRES) tablet 0.1 mg (0.1 mg Oral Given 04/14/20 0155)  iohexol (OMNIPAQUE) 350 MG/ML injection 75 mL (75 mLs Intravenous Contrast Given 04/14/20 0235)  amLODipine (NORVASC) tablet 10 mg (10 mg Oral Given 04/14/20 0701)     ED Discharge Orders    None      *Please note:  Claryssa S Leung was evaluated in Emergency Department on 04/15/2020 for the symptoms described in the history of present illness. She was evaluated in the context of the global COVID-19 pandemic, which necessitated consideration that the patient might  be at risk for infection with the SARS-CoV-2 virus that causes COVID-19. Institutional protocols and algorithms that pertain to the evaluation of patients at risk for COVID-19 are in a state of rapid change based on information released by regulatory bodies including the CDC and federal and state organizations. These policies and algorithms were followed during the patient's care in the ED.  Some ED evaluations and interventions may be delayed as a result of limited staffing during and after the pandemic.*  Note:  This document was prepared using Dragon voice recognition software and may include unintentional dictation errors.   Gregor Hams, MD 04/15/20 [phone]

## 2020-04-16 ENCOUNTER — Ambulatory Visit: Payer: Self-pay | Admitting: Urology

## 2020-04-17 ENCOUNTER — Encounter: Payer: Self-pay | Admitting: Urology

## 2020-06-09 ENCOUNTER — Emergency Department
Admission: EM | Admit: 2020-06-09 | Discharge: 2020-06-10 | Disposition: A | Discharge status: Home / Self Care | Payer: Medicare HMO | Attending: Emergency Medicine | Admitting: Emergency Medicine

## 2020-06-09 ENCOUNTER — Encounter: Payer: Self-pay | Admitting: Emergency Medicine

## 2020-06-09 ENCOUNTER — Other Ambulatory Visit: Payer: Self-pay

## 2020-06-09 DIAGNOSIS — F251 Schizoaffective disorder, depressive type: Secondary | ICD-10-CM | POA: Diagnosis not present

## 2020-06-09 DIAGNOSIS — F250 Schizoaffective disorder, bipolar type: Secondary | ICD-10-CM | POA: Diagnosis not present

## 2020-06-09 DIAGNOSIS — N1831 Chronic kidney disease, stage 3a: Secondary | ICD-10-CM | POA: Diagnosis not present

## 2020-06-09 DIAGNOSIS — Z20822 Contact with and (suspected) exposure to covid-19: Secondary | ICD-10-CM | POA: Diagnosis not present

## 2020-06-09 DIAGNOSIS — F17210 Nicotine dependence, cigarettes, uncomplicated: Secondary | ICD-10-CM | POA: Diagnosis not present

## 2020-06-09 DIAGNOSIS — R9431 Abnormal electrocardiogram [ECG] [EKG]: Secondary | ICD-10-CM

## 2020-06-09 DIAGNOSIS — E119 Type 2 diabetes mellitus without complications: Secondary | ICD-10-CM

## 2020-06-09 DIAGNOSIS — Z79899 Other long term (current) drug therapy: Secondary | ICD-10-CM | POA: Insufficient documentation

## 2020-06-09 DIAGNOSIS — Z7982 Long term (current) use of aspirin: Secondary | ICD-10-CM | POA: Insufficient documentation

## 2020-06-09 DIAGNOSIS — R627 Adult failure to thrive: Secondary | ICD-10-CM | POA: Insufficient documentation

## 2020-06-09 DIAGNOSIS — E860 Dehydration: Secondary | ICD-10-CM | POA: Diagnosis not present

## 2020-06-09 DIAGNOSIS — F209 Schizophrenia, unspecified: Secondary | ICD-10-CM

## 2020-06-09 DIAGNOSIS — E1129 Type 2 diabetes mellitus with other diabetic kidney complication: Secondary | ICD-10-CM | POA: Insufficient documentation

## 2020-06-09 DIAGNOSIS — I10 Essential (primary) hypertension: Secondary | ICD-10-CM | POA: Diagnosis present

## 2020-06-09 DIAGNOSIS — I4581 Long QT syndrome: Secondary | ICD-10-CM | POA: Diagnosis not present

## 2020-06-09 DIAGNOSIS — I129 Hypertensive chronic kidney disease with stage 1 through stage 4 chronic kidney disease, or unspecified chronic kidney disease: Secondary | ICD-10-CM | POA: Insufficient documentation

## 2020-06-09 DIAGNOSIS — N183 Chronic kidney disease, stage 3 unspecified: Secondary | ICD-10-CM | POA: Diagnosis present

## 2020-06-09 DIAGNOSIS — N184 Chronic kidney disease, stage 4 (severe): Secondary | ICD-10-CM | POA: Diagnosis present

## 2020-06-09 LAB — CBC WITH DIFFERENTIAL/PLATELET
Abs Immature Granulocytes: 0.03 10*3/uL (ref 0.00–0.07)
Basophils Absolute: 0 10*3/uL (ref 0.0–0.1)
Basophils Relative: 1 %
Eosinophils Absolute: 0 10*3/uL (ref 0.0–0.5)
Eosinophils Relative: 0 %
HCT: 44.4 % (ref 36.0–46.0)
Hemoglobin: 14.1 g/dL (ref 12.0–15.0)
Immature Granulocytes: 1 %
Lymphocytes Relative: 46 %
Lymphs Abs: 2.9 10*3/uL (ref 0.7–4.0)
MCH: 27.4 pg (ref 26.0–34.0)
MCHC: 31.8 g/dL (ref 30.0–36.0)
MCV: 86.2 fL (ref 80.0–100.0)
Monocytes Absolute: 0.6 10*3/uL (ref 0.1–1.0)
Monocytes Relative: 10 %
Neutro Abs: 2.6 10*3/uL (ref 1.7–7.7)
Neutrophils Relative %: 42 %
Platelets: 277 10*3/uL (ref 150–400)
RBC: 5.15 MIL/uL — ABNORMAL HIGH (ref 3.87–5.11)
RDW: 14.9 % (ref 11.5–15.5)
WBC: 6.1 10*3/uL (ref 4.0–10.5)
nRBC: 0 % (ref 0.0–0.2)

## 2020-06-09 LAB — CBG MONITORING, ED
Glucose-Capillary: 149 mg/dL — ABNORMAL HIGH (ref 70–99)
Glucose-Capillary: 184 mg/dL — ABNORMAL HIGH (ref 70–99)
Glucose-Capillary: 127 mg/dL — ABNORMAL HIGH (ref 70–99)
Glucose-Capillary: 152 mg/dL — ABNORMAL HIGH (ref 70–99)
Glucose-Capillary: 91 mg/dL (ref 70–99)

## 2020-06-09 LAB — URINALYSIS, COMPLETE (UACMP) WITH MICROSCOPIC
Bacteria, UA: NONE SEEN
Bilirubin Urine: NEGATIVE
Glucose, UA: NEGATIVE mg/dL
Ketones, ur: 20 mg/dL — AB
Leukocytes,Ua: NEGATIVE
Nitrite: NEGATIVE
Protein, ur: >=300 mg/dL — AB
Specific Gravity, Urine: 1.011 (ref 1.005–1.030)
pH: 7 (ref 5.0–8.0)

## 2020-06-09 LAB — PREGNANCY, URINE: Preg Test, Ur: NEGATIVE

## 2020-06-09 LAB — URINE DRUG SCREEN, QUALITATIVE (ARMC ONLY)
Amphetamines, Ur Screen: NOT DETECTED
Barbiturates, Ur Screen: NOT DETECTED
Benzodiazepine, Ur Scrn: NOT DETECTED
Cannabinoid 50 Ng, Ur ~~LOC~~: NOT DETECTED
Cocaine Metabolite,Ur ~~LOC~~: NOT DETECTED
MDMA (Ecstasy)Ur Screen: NOT DETECTED
Methadone Scn, Ur: NOT DETECTED
Opiate, Ur Screen: NOT DETECTED
Phencyclidine (PCP) Ur S: NOT DETECTED
Tricyclic, Ur Screen: NOT DETECTED

## 2020-06-09 LAB — COMPREHENSIVE METABOLIC PANEL
ALT: 16 U/L (ref 0–44)
AST: 26 U/L (ref 15–41)
Albumin: 3 g/dL — ABNORMAL LOW (ref 3.5–5.0)
Alkaline Phosphatase: 89 U/L (ref 38–126)
Anion gap: 16 — ABNORMAL HIGH (ref 5–15)
BUN: 16 mg/dL (ref 6–20)
CO2: 23 mmol/L (ref 22–32)
Calcium: 9.2 mg/dL (ref 8.9–10.3)
Chloride: 101 mmol/L (ref 98–111)
Creatinine, Ser: 1.3 mg/dL — ABNORMAL HIGH (ref 0.44–1.00)
GFR, Estimated: 47 mL/min — ABNORMAL LOW (ref >60)
Glucose, Bld: 208 mg/dL — ABNORMAL HIGH (ref 70–99)
Potassium: 3.4 mmol/L — ABNORMAL LOW (ref 3.5–5.1)
Sodium: 140 mmol/L (ref 135–145)
Total Bilirubin: 1.4 mg/dL — ABNORMAL HIGH (ref 0.3–1.2)
Total Protein: 7.3 g/dL (ref 6.5–8.1)

## 2020-06-09 LAB — HEMOGLOBIN A1C
Hgb A1c MFr Bld: 7.9 % — ABNORMAL HIGH (ref 4.8–5.6)
Mean Plasma Glucose: 180.03 mg/dL

## 2020-06-09 LAB — ETHANOL: Alcohol, Ethyl (B): <10 mg/dL (ref <10)

## 2020-06-09 LAB — RESPIRATORY PANEL BY RT PCR (FLU A&B, COVID)
Influenza A by PCR: NEGATIVE
Influenza B by PCR: NEGATIVE
SARS Coronavirus 2 by RT PCR: NEGATIVE

## 2020-06-09 MED ORDER — INSULIN ASPART 100 UNIT/ML ~~LOC~~ SOLN
0.0000 [IU] | Freq: Three times a day (TID) | SUBCUTANEOUS | Status: DC
  Administered 2020-06-09: 2 [IU] via SUBCUTANEOUS
  Administered 2020-06-09: 1 [IU] via SUBCUTANEOUS
  Administered 2020-06-09: 2 [IU] via SUBCUTANEOUS
  Filled 2020-06-09 (×3): qty 1

## 2020-06-09 MED ORDER — ATORVASTATIN CALCIUM 20 MG PO TABS
40.0000 mg | ORAL_TABLET | Freq: Every day | ORAL | Status: DC
  Administered 2020-06-09: 40 mg via ORAL
  Filled 2020-06-09: qty 2

## 2020-06-09 MED ORDER — LOSARTAN POTASSIUM 50 MG PO TABS
100.0000 mg | ORAL_TABLET | Freq: Once | ORAL | Status: AC
  Administered 2020-06-09: 100 mg via ORAL
  Filled 2020-06-09: qty 2

## 2020-06-09 MED ORDER — MAGNESIUM SULFATE 2 GM/50ML IV SOLN
2.0000 g | Freq: Once | INTRAVENOUS | Status: AC
  Administered 2020-06-09: 2 g via INTRAVENOUS
  Filled 2020-06-09: qty 50

## 2020-06-09 MED ORDER — AMLODIPINE BESYLATE 5 MG PO TABS
10.0000 mg | ORAL_TABLET | Freq: Once | ORAL | Status: AC
  Administered 2020-06-09: 10 mg via ORAL
  Filled 2020-06-09: qty 2

## 2020-06-09 MED ORDER — LACTATED RINGERS IV BOLUS
1000.0000 mL | Freq: Once | INTRAVENOUS | Status: AC
  Administered 2020-06-09: 1000 mL via INTRAVENOUS

## 2020-06-09 MED ORDER — AMLODIPINE BESYLATE 5 MG PO TABS: 10.0000 mg | ORAL_TABLET | Freq: Every day | ORAL | Status: DC

## 2020-06-09 MED ORDER — HYDROCHLOROTHIAZIDE 25 MG PO TABS: 12.5000 mg | ORAL_TABLET | Freq: Every day | ORAL | Status: DC

## 2020-06-09 MED ORDER — LOSARTAN POTASSIUM 50 MG PO TABS: 100.0000 mg | ORAL_TABLET | Freq: Every day | ORAL | Status: DC

## 2020-06-09 MED ORDER — HYDROCHLOROTHIAZIDE 12.5 MG PO CAPS
12.5000 mg | ORAL_CAPSULE | Freq: Every day | ORAL | Status: DC
  Administered 2020-06-09: 12.5 mg via ORAL
  Filled 2020-06-09: qty 1

## 2020-06-09 MED ORDER — INSULIN ASPART 100 UNIT/ML ~~LOC~~ SOLN: 0.0000 [IU] | Freq: Every day | SUBCUTANEOUS | Status: DC

## 2020-06-09 MED ORDER — POTASSIUM CHLORIDE 20 MEQ PO PACK
40.0000 meq | PACK | Freq: Once | ORAL | Status: AC
  Administered 2020-06-09: 40 meq via ORAL
  Filled 2020-06-09: qty 2

## 2020-06-09 NOTE — BH Assessment (Signed)
Patient has been accepted to [facility].  Patient assigned to [facility] Accepting physician is Dr. Gerrit Halls.  Call report to [phone].  Representative was [name].   ER Staff is aware of it:  Vaughan Basta, ER Secretary  Dr. Cinda Quest, ER MD  Raquel Sarna, Patient's Nurse     Patient can be transported anytime on 06/10/20.

## 2020-06-09 NOTE — ED Notes (Signed)
Pt given lunch tray at this time and is eating [place] sandwich and drinking milk. No further needs noted, will continue to monitor.

## 2020-06-09 NOTE — Consult Note (Signed)
[facility] Face-to-Face Psychiatry Consult   Reason for Consult: Consult for this 59 year old woman with a history of schizoaffective disorder brought to the emergency room by her sister Referring Physician: Joni Fears Patient Identification: Alicia Rogers MRN:  [id number] Principal Diagnosis: Schizoaffective disorder, depressive type ([place]) Diagnosis:  Principal Problem:   Schizoaffective disorder, depressive type ([place]) Active Problems:   BP (high blood pressure)   Diabetes mellitus, type 2 ([place])   CKD (chronic kidney disease), stage IIIa   Total Time spent with patient: 1 hour  Subjective:   Alicia Rogers is a 59 y.o. female patient admitted with "I got dizzy".  HPI: Patient brought to the emergency room by her sister specifically for complaint of dizziness and inability to stand up while taking a shower. Bigger picture the sister reports that the patient has been in poor health and declining for much of the last year. She eats very little. Usually will only drink clear liquids such as juices. Has lost a lot of weight. Has not been taking any medication whether medical or psychiatric for much of the last year. Gets incontinent at times. Requiring more and more total care. On interview the patient acknowledges all of this. She seems a little confused although she is able to be redirected into a reasonable back-and-forth conversation. She denies suicidal ideation. She is ambivalent about admitting whether she still hears voices. She cannot give a very clear reason for why she has been off of her medicine. She gives inconsistent reasons about sleeping and eating although she does indicate that she feels tired much of the time. No alcohol or drug abuse. Patient admits that she feels negative and down with little to look forward to.  Past Psychiatric History: Past history of schizoaffective disorder depressive type. Long history of stable outpatient treatment when she was taking antipsychotics.  Appears to have fallen out of treatment and been off medicine for at least the past year. No history of suicide attempts in the past. When she is on medicine she has a history of functioning quite well and being able to work.  Risk to Self:   Risk to Others:   Prior Inpatient Therapy:   Prior Outpatient Therapy:    Past Medical History:  Past Medical History:  Diagnosis Date  . Depression   . Diabetes mellitus without complication ([place])   . Diabetes mellitus, type II ([place])   . Hypertension   . Schizophrenia ([place])    History reviewed. No pertinent surgical history. Family History:  Family History  Problem Relation Age of Onset  . Diabetes Mother   . Glaucoma Mother   . Heart disease Mother   . Heart attack Mother   . Depression Mother   . Hypertension Mother   . Diabetes Father   . Glaucoma Father   . Hypertension Father   . Diabetes Sister   . Depression Sister   . Post-traumatic stress disorder Sister    Family Psychiatric  History: None reported Social History:  Social History   Substance and Sexual Activity  Alcohol Use No  . Alcohol/week: 0.0 standard drinks     Social History   Substance and Sexual Activity  Drug Use No    Social History   Socioeconomic History  . Marital status: Single    Spouse name: Not on file  . Number of children: Not on file  . Years of education: Not on file  . Highest education level: Not on file  Occupational History  . Not on  file  Tobacco Use  . Smoking status: Current Every Day Smoker    Packs/day: 1.00    Types: Cigarettes    Start date: 04/21/1978  . Smokeless tobacco: Never Used  Vaping Use  . Vaping Use: Never used  Substance and Sexual Activity  . Alcohol use: No    Alcohol/week: 0.0 standard drinks  . Drug use: No  . Sexual activity: Never  Other Topics Concern  . Not on file  Social History Narrative  . Not on file   Social Determinants of Health   Financial Resource Strain:   . Difficulty of Paying  Living Expenses: Not on file  Food Insecurity:   . Worried About Charity fundraiser in the Last Year: Not on file  . Ran Out of Food in the Last Year: Not on file  Transportation Needs:   . Lack of Transportation (Medical): Not on file  . Lack of Transportation (Non-Medical): Not on file  Physical Activity:   . Days of Exercise per Week: Not on file  . Minutes of Exercise per Session: Not on file  Stress:   . Feeling of Stress : Not on file  Social Connections:   . Frequency of Communication with Friends and Family: Not on file  . Frequency of Social Gatherings with Friends and Family: Not on file  . Attends Religious Services: Not on file  . Active Member of Clubs or Organizations: Not on file  . Attends Archivist Meetings: Not on file  . Marital Status: Not on file   Additional Social History:    Allergies:   Allergies  Allergen Reactions  . Penicillins     Rash    Labs:  Results for orders placed or performed during the hospital encounter of 06/09/20 (from the past 48 hour(s))  CBG monitoring, ED     Status: Abnormal   Collection Time: 06/09/20  1:58 AM  Result Value Ref Range   Glucose-Capillary 149 (H) 70 - 99 mg/dL    Comment: Glucose reference range applies only to samples taken after fasting for at least 8 hours.   Comment 1 Notify RN    Comment 2 Document in Chart   CBC with Differential     Status: Abnormal   Collection Time: 06/09/20  2:06 AM  Result Value Ref Range   WBC 6.1 4.0 - 10.5 K/uL   RBC 5.15 (H) 3.87 - 5.11 MIL/uL   Hemoglobin 14.1 12.0 - 15.0 g/dL   HCT 44.4 36 - 46 %   MCV 86.2 80.0 - 100.0 fL   MCH 27.4 26.0 - 34.0 pg   MCHC 31.8 30.0 - 36.0 g/dL   RDW 14.9 11.5 - 15.5 %   Platelets 277 150 - 400 K/uL   nRBC 0.0 0.0 - 0.2 %   Neutrophils Relative % 42 %   Neutro Abs 2.6 1.7 - 7.7 K/uL   Lymphocytes Relative 46 %   Lymphs Abs 2.9 0.7 - 4.0 K/uL   Monocytes Relative 10 %   Monocytes Absolute 0.6 0.1 - 1.0 K/uL   Eosinophils  Relative 0 %   Eosinophils Absolute 0.0 0.0 - 0.5 K/uL   Basophils Relative 1 %   Basophils Absolute 0.0 0.0 - 0.1 K/uL   Immature Granulocytes 1 %   Abs Immature Granulocytes 0.03 0.00 - 0.07 K/uL    Comment: Performed at [facility], [address] [place], [place] [address]  Comprehensive metabolic panel     Status: Abnormal  Collection Time: 06/09/20  2:06 AM  Result Value Ref Range   Sodium 140 135 - 145 mmol/L   Potassium 3.4 (L) 3.5 - 5.1 mmol/L   Chloride 101 98 - 111 mmol/L   CO2 23 22 - 32 mmol/L   Glucose, Bld 208 (H) 70 - 99 mg/dL    Comment: Glucose reference range applies only to samples taken after fasting for at least 8 hours.   BUN 16 6 - 20 mg/dL   Creatinine, Ser 1.30 (H) 0.44 - 1.00 mg/dL   Calcium 9.2 8.9 - 10.3 mg/dL   Total Protein 7.3 6.5 - 8.1 g/dL   Albumin 3.0 (L) 3.5 - 5.0 g/dL   AST 26 15 - 41 U/L   ALT 16 0 - 44 U/L   Alkaline Phosphatase 89 38 - 126 U/L   Total Bilirubin 1.4 (H) 0.3 - 1.2 mg/dL   GFR, Estimated 47 (L) >60 mL/min    Comment: (NOTE) Calculated using the CKD-EPI Creatinine Equation (2021)    Anion gap 16 (H) 5 - 15    Comment: Performed at [facility], [address] [place], [place] [address]  Ethanol     Status: None   Collection Time: 06/09/20  2:06 AM  Result Value Ref Range   Alcohol, Ethyl (B) <10 <10 mg/dL    Comment: (NOTE) Lowest detectable limit for serum alcohol is 10 mg/dL.  For medical purposes only. Performed at [facility], [address] [place], [place] [address]   Hemoglobin A1c     Status: Abnormal   Collection Time: 06/09/20  2:06 AM  Result Value Ref Range   Hgb A1c MFr Bld 7.9 (H) 4.8 - 5.6 %    Comment: (NOTE) Pre diabetes:          5.7%-6.4%  Diabetes:              >6.4%  Glycemic control for   <7.0% adults with diabetes    Mean Plasma Glucose 180.03 mg/dL    Comment: Performed at [facility] [address], [place], [place] [address]  Urine Drug  Screen, Qualitative ([place] only)     Status: None   Collection Time: 06/09/20  2:57 AM  Result Value Ref Range   Tricyclic, Ur Screen NONE DETECTED NONE DETECTED   Amphetamines, Ur Screen NONE DETECTED NONE DETECTED   MDMA (Ecstasy)Ur Screen NONE DETECTED NONE DETECTED   Cocaine Metabolite,Ur [place] NONE DETECTED NONE DETECTED   Opiate, Ur Screen NONE DETECTED NONE DETECTED   Phencyclidine (PCP) Ur S NONE DETECTED NONE DETECTED   Cannabinoid 50 Ng, Ur [place] NONE DETECTED NONE DETECTED   Barbiturates, Ur Screen NONE DETECTED NONE DETECTED   Benzodiazepine, Ur Scrn NONE DETECTED NONE DETECTED   Methadone Scn, Ur NONE DETECTED NONE DETECTED    Comment: (NOTE) Tricyclics + metabolites, urine    Cutoff 1000 ng/mL Amphetamines + metabolites, urine  Cutoff 1000 ng/mL MDMA (Ecstasy), urine              Cutoff 500 ng/mL Cocaine Metabolite, urine          Cutoff 300 ng/mL Opiate + metabolites, urine        Cutoff 300 ng/mL Phencyclidine (PCP), urine         Cutoff 25 ng/mL Cannabinoid, urine                 Cutoff 50 ng/mL Barbiturates + metabolites, urine  Cutoff 200 ng/mL Benzodiazepine, urine  Cutoff 200 ng/mL Methadone, urine                   Cutoff 300 ng/mL  The urine drug screen provides only a preliminary, unconfirmed analytical test result and should not be used for non-medical purposes. Clinical consideration and professional judgment should be applied to any positive drug screen result due to possible interfering substances. A more specific alternate chemical method must be used in order to obtain a confirmed analytical result. Gas chromatography / mass spectrometry (GC/MS) is the preferred confirm atory method. Performed at [facility], [address] [place], [place] [address]   Urinalysis, Complete w Microscopic     Status: Abnormal   Collection Time: 06/09/20  2:57 AM  Result Value Ref Range   Color, Urine YELLOW (A) YELLOW   APPearance CLEAR (A) CLEAR    Specific Gravity, Urine 1.011 1.005 - 1.030   pH 7.0 5.0 - 8.0   Glucose, UA NEGATIVE NEGATIVE mg/dL   Hgb urine dipstick SMALL (A) NEGATIVE   Bilirubin Urine NEGATIVE NEGATIVE   Ketones, ur 20 (A) NEGATIVE mg/dL   Protein, ur >=300 (A) NEGATIVE mg/dL   Nitrite NEGATIVE NEGATIVE   Leukocytes,Ua NEGATIVE NEGATIVE   RBC / HPF 6-10 0 - 5 RBC/hpf   WBC, UA 0-5 0 - 5 WBC/hpf   Bacteria, UA NONE SEEN NONE SEEN   Squamous Epithelial / LPF 6-10 0 - 5   Mucus PRESENT    Hyaline Casts, UA PRESENT     Comment: Performed at [facility], [address] [place], [place] [address]  Pregnancy, urine     Status: None   Collection Time: 06/09/20  2:57 AM  Result Value Ref Range   Preg Test, Ur NEGATIVE NEGATIVE    Comment: Performed at [facility], [address] [place], [place] [address]  Respiratory Panel by RT PCR (Flu A&B, Covid) - Nasopharyngeal Swab     Status: None   Collection Time: 06/09/20  5:55 AM   Specimen: Nasopharyngeal Swab  Result Value Ref Range   SARS Coronavirus 2 by RT PCR NEGATIVE NEGATIVE    Comment: (NOTE) SARS-CoV-2 target nucleic acids are NOT DETECTED.  The SARS-CoV-2 RNA is generally detectable in upper respiratoy specimens during the acute phase of infection. The lowest concentration of SARS-CoV-2 viral copies this assay can detect is 131 copies/mL. A negative result does not preclude SARS-Cov-2 infection and should not be used as the sole basis for treatment or other patient management decisions. A negative result may occur with  improper specimen collection/handling, submission of specimen other than nasopharyngeal swab, presence of viral mutation(s) within the areas targeted by this assay, and inadequate number of viral copies (<131 copies/mL). A negative result must be combined with clinical observations, patient history, and epidemiological information. The expected result is Negative.  Fact Sheet for Patients:   PinkCheek.be  Fact Sheet for Healthcare Providers:  GravelBags.it  This test is no t yet approved or cleared by the [place] FDA and  has been authorized for detection and/or diagnosis of SARS-CoV-2 by FDA under an Emergency Use Authorization (EUA). This EUA will remain  in effect (meaning this test can be used) for the duration of the COVID-19 declaration under Section 564(b)(1) of the Act, 21 U.S.C. section 360bbb-3(b)(1), unless the authorization is terminated or revoked sooner.     Influenza A by PCR NEGATIVE NEGATIVE   Influenza B by PCR NEGATIVE NEGATIVE    Comment: (NOTE) The Xpert  Xpress SARS-CoV-2/FLU/RSV assay is intended as an aid in  the diagnosis of influenza from Nasopharyngeal swab specimens and  should not be used as a sole basis for treatment. Nasal washings and  aspirates are unacceptable for Xpert Xpress SARS-CoV-2/FLU/RSV  testing.  Fact Sheet for Patients: PinkCheek.be  Fact Sheet for Healthcare Providers: GravelBags.it  This test is not yet approved or cleared by the [place] FDA and  has been authorized for detection and/or diagnosis of SARS-CoV-2 by  FDA under an Emergency Use Authorization (EUA). This EUA will remain  in effect (meaning this test can be used) for the duration of the  Covid-19 declaration under Section 564(b)(1) of the Act, 21  U.S.C. section 360bbb-3(b)(1), unless the authorization is  terminated or revoked. Performed at [facility], [address] [place], [place] [address]   CBG monitoring, ED     Status: Abnormal   Collection Time: 06/09/20  8:47 AM  Result Value Ref Range   Glucose-Capillary 184 (H) 70 - 99 mg/dL    Comment: Glucose reference range applies only to samples taken after fasting for at least 8 hours.    Current Facility-Administered Medications  Medication Dose Route Frequency  Provider Last Rate Last Admin  . [START ON 06/10/2020] amLODipine (NORVASC) tablet 10 mg  10 mg Oral Daily Alfred Levins, [place], MD      . atorvastatin (LIPITOR) tablet 40 mg  40 mg Oral q1800 Alfred Levins, [place], MD      . hydrochlorothiazide (MICROZIDE) capsule 12.5 mg  12.5 mg Oral Daily Alfred Levins, [place], MD   12.5 mg at 06/09/20 0855  . insulin aspart (novoLOG) injection 0-5 Units  0-5 Units Subcutaneous QHS Alfred Levins, [place], MD      . insulin aspart (novoLOG) injection 0-9 Units  0-9 Units Subcutaneous TID WC Rudene Re, MD   2 Units at 06/09/20 0856  . [START ON 06/10/2020] losartan (COZAAR) tablet 100 mg  100 mg Oral Daily Rudene Re, MD       Current Outpatient Medications  Medication Sig Dispense Refill  . ACCU-CHEK FASTCLIX LANCETS MISC     . ACCU-CHEK SMARTVIEW test strip     . amLODipine (NORVASC) 10 MG tablet Take 1 tablet (10 mg total) by mouth daily. 30 tablet 1  . aspirin EC 81 MG EC tablet Take 1 tablet (81 mg total) by mouth daily. Swallow whole. 30 tablet 1  . atorvastatin (LIPITOR) 80 MG tablet Take 40 mg by mouth daily at 6 PM.     . Blood Glucose Monitoring Suppl (FIFTY50 GLUCOSE METER 2.0) W/DEVICE KIT Check blood sugar 3 times a day. Dx E11.9    . hydrochlorothiazide (HYDRODIURIL) 12.5 MG tablet Take 12.5 mg by mouth daily.    Elmore Gu[employer] Devices (RELION LANCING DEVICE) MISC     . losartan (COZAAR) 100 MG tablet Take 100 mg by mouth daily.    . methocarbamol (ROBAXIN) 500 MG tablet Take 1 tablet (500 mg total) by mouth every 8 (eight) hours as needed for muscle spasms. 20 tablet 0  . sitaGLIPtin (JANUVIA) 100 MG tablet Take 100 mg by mouth daily.      Musculoskeletal: Strength & Muscle Tone: decreased Gait & Station: normal Patient leans: N/A  Psychiatric Specialty Exam: Physical Exam Vitals and nursing note reviewed.  Constitutional:      Appearance: She is well-developed. She is ill-appearing.  HENT:     Head: Normocephalic and atraumatic.   Eyes:     Conjunctiva/sclera: Conjunctivae normal.     Pupils:  Pupils are equal, round, and reactive to light.  Cardiovascular:     Heart sounds: Normal heart sounds.  Pulmonary:     Effort: Pulmonary effort is normal.  Abdominal:     Palpations: Abdomen is soft.  Musculoskeletal:        General: Normal range of motion.     Cervical back: Normal range of motion.  Skin:    General: Skin is warm and dry.  Neurological:     General: No focal deficit present.     Mental Status: She is alert.  Psychiatric:        Attention and Perception: She is inattentive.        Mood and Affect: Mood is depressed.        Speech: Speech is delayed.        Behavior: Behavior is slowed.        Thought Content: Thought content is paranoid. Thought content does not include suicidal ideation.        Cognition and Memory: Cognition is impaired. Memory is impaired.        Judgment: Judgment is inappropriate.     Review of Systems  Constitutional: Positive for fatigue and unexpected weight change.  HENT: Negative.   Eyes: Negative.   Respiratory: Negative.   Cardiovascular: Negative.   Gastrointestinal: Negative.   Musculoskeletal: Negative.   Skin: Negative.   Neurological: Negative.   Psychiatric/Behavioral: Positive for confusion, dysphoric mood and sleep disturbance. Negative for suicidal ideas. The patient is nervous/anxious.     Blood pressure (!) 186/79, pulse 81, temperature 97.6 F (36.4 C), temperature source Oral, resp. rate 20, height '5\' 2"'  (1.575 m), weight 36.3 kg, SpO2 100 %.Body mass index is 14.63 kg/m.  General Appearance: Disheveled  Eye Contact:  Fair  Speech:  Slow  Volume:  Decreased  Mood:  Depressed  Affect:  Congruent and Flat  Thought Process:  Coherent  Orientation:  Full (Time, Place, and Person)  Thought Content:  Hallucinations: Auditory, Rumination and Tangential  Suicidal Thoughts:  No  Homicidal Thoughts:  No  Memory:  Immediate;   Fair Recent;    Poor Remote;   Fair  Judgement:  Impaired  Insight:  Shallow  Psychomotor Activity:  Decreased  Concentration:  Concentration: Fair  Recall:  [employer] of Knowledge:  Fair  Language:  Fair  Akathisia:  No  Handed:  Right  AIMS (if indicated):     Assets:  Desire for Improvement Housing Resilience  ADL's:  Impaired  Cognition:  Impaired,  Mild  Sleep:        Treatment Plan Summary: Daily contact with patient to assess and evaluate symptoms and progress in treatment, Medication management and Plan 59 year old woman with schizoaffective disorder appears to be depressed with poor self-care medicine noncompliance dizziness weight loss malnutrition. She is not voicing any specific suicidal ideation but has been noncompliant with recommended treatment outside the hospital. Based on her weakness and the time she has been off of medication I suggested that inpatient treatment would be the most effective indicated way to get her well. Patient was agreeable to voluntary admission if we can find a geriatric psychiatry unit. Hold off yet for now on any medication while we look into trying to find geropsychiatry beds. Case reviewed with emergency room doctor and TTS.  Disposition: Recommend psychiatric Inpatient admission when medically cleared. Supportive therapy provided about ongoing stressors. Discussed crisis plan, support from social network, calling 911, coming to the Emergency Department, and calling Suicide  Hotline.  Alethia Berthold, MD 06/09/2020 10:55 AM

## 2020-06-09 NOTE — ED Notes (Signed)
Pt ate all of [place] sandwich.

## 2020-06-09 NOTE — ED Notes (Signed)
Sister called this Therapist, sports. With pts permission, Sister was given an update.  Sister made aware of transport to [place]. Sister states she will come and visit pt in the morning. RN approved Brewing technologist made aware and approved as well.

## 2020-06-09 NOTE — ED Notes (Signed)
VOL patient moved from 16 to Rm 21  consult complete will be placement

## 2020-06-09 NOTE — BH Specialist Note (Addendum)
Referral information for Psychiatric Hospitalization faxed to:   [facility] [phone])  . [facility] [phone], [phone]  or [phone])   [facility] [phone], [phone])   [facility] [phone])   [facility] [phone] or [phone]),   . [facility] [phone] -or- [phone]),   . [facility] [phone] or [phone])  . [place] [phone])

## 2020-06-09 NOTE — ED Notes (Signed)
Report given to Amy, RN. Pt placed in [place] scrubs.

## 2020-06-09 NOTE — ED Provider Notes (Addendum)
[facility] Emergency Department Provider Note  ____________________________________________  Time seen: Approximately 3:44 AM  I have reviewed the triage vital signs and the nursing notes.   HISTORY  Chief Complaint Loss of Consciousness and Failure To Thrive   HPI Alicia Rogers is a 59 y.o. female with a history of schizophrenia, diabetes, hypertension, chronic kidney disease who presents from home with her sister for failure to thrive. Patient has been living with her sister who is her main caretaker. History is gathered mostly from her sister who was at bedside. Patient has been failing to thrive for over a year now. Does not eat much, refuses to drink anything other than soda and juice. Refuses most of her meals. Has not been taking any of her medications for the last year. Has been incontinent of urine also for over a year and refuses to wear depends.  Patient sister is extremely overwhelmed taking care of her. She has tried to place her in a facility unsuccessfully. Patient tells me that she does not eat because she does not have an appetite. She does tell me that she feels sad but she says that she feels sad all the time. She does not complain of any worsening symptoms of depression. She denies any suicidal thoughts. Patient denies chest pain, shortness of breath, abdominal pain, dysuria or hematuria.  Past Medical History:  Diagnosis Date   Depression    Diabetes mellitus without complication ([place])    Diabetes mellitus, type II ([place])    Hypertension    Schizophrenia ([place])     Patient Active Problem List   Diagnosis Date Noted   Hypertensive urgency 02/28/2020   Back pain 02/28/2020   CKD (chronic kidney disease), stage IIIa 02/28/2020   Hypokalemia 02/28/2020   Elevated troponin 02/28/2020   Hematuria 02/28/2020   Tobacco abuse 02/28/2020   Dizziness 02/28/2020   HLD (hyperlipidemia) 02/28/2020   Type II diabetes mellitus  with renal manifestations ([place]) 02/28/2020   Compulsive tobacco user syndrome 10/11/2014   BP (high blood pressure) 09/04/2014   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   Claudication ([place]) 06/09/2014   Schizoaffective disorder, depressive type ([place]) 06/09/2014   Diabetes mellitus, type 2 ([place]) 06/09/2014   Type 2 diabetes mellitus ([place]) 06/09/2014   Controlled type 2 diabetes mellitus without complication ([place]) 19/41/7408    History reviewed. No pertinent surgical history.  Prior to Admission medications   Medication Sig Start Date End Date Taking? Authorizing Provider  ACCU-CHEK FASTCLIX LANCETS [place]  02/17/15   [provider]  ACCU-CHEK SMARTVIEW test strip  04/02/15   [provider]  amLODipine (NORVASC) 10 MG tablet Take 1 tablet (10 mg total) by mouth daily. 03/01/20   Ezekiel Slocumb, DO  aspirin EC 81 MG EC tablet Take 1 tablet (81 mg total) by mouth daily. Swallow whole. 03/01/20   Ezekiel Slocumb, DO  atorvastatin (LIPITOR) 80 MG tablet Take 40 mg by mouth daily at 6 PM.  08/20/15   [provider]  Blood Glucose Monitoring Suppl (FIFTY50 GLUCOSE METER 2.0) W/DEVICE KIT Check blood sugar 3 times a day. Dx E11.9 07/04/14   [provider]  hydrochlorothiazide (HYDRODIURIL) 12.5 MG tablet Take 12.5 mg by mouth daily. 12/06/19   [provider]  Lancet Devices (RELION LANCING DEVICE) [place]  05/09/14   [provider]  losartan (COZAAR) 100 MG tablet Take 100 mg by mouth daily.    [provider]  methocarbamol (ROBAXIN) 500  MG tablet Take 1 tablet (500 mg total) by mouth every 8 (eight) hours as needed for muscle spasms. 02/29/20   Nicole Kindred A, DO  sitaGLIPtin (JANUVIA) 100 MG tablet Take 100 mg by mouth daily.    [provider]  traZODone (DESYREL) 150 MG tablet Take 1 tablet (150 mg total) by mouth at bedtime as needed for sleep. Patient not taking: Reported on 02/21/2017 06/08/16  01/26/20  Elvin So, MD    Allergies Penicillins  Family History  Problem Relation Age of Onset   Diabetes Mother    Glaucoma Mother    Heart disease Mother    Heart attack Mother    Depression Mother    Hypertension Mother    Diabetes Father    Glaucoma Father    Hypertension Father    Diabetes Sister    Depression Sister    Post-traumatic stress disorder Sister     Social History Social History   Tobacco Use   Smoking status: Current Every Day Smoker    Packs/day: 1.00    Types: Cigarettes    Start date: 04/21/1978   Smokeless tobacco: Never Used  Vaping Use   Vaping Use: Never used  Substance Use Topics   Alcohol use: No    Alcohol/week: 0.0 standard drinks   Drug use: No    Review of Systems  Constitutional: Negative for fever. Eyes: Negative for visual changes. ENT: Negative for sore throat. Neck: No neck pain  Cardiovascular: Negative for chest pain. Respiratory: Negative for shortness of breath. Gastrointestinal: Negative for abdominal pain, vomiting or diarrhea. + anorexia Genitourinary: Negative for dysuria. Musculoskeletal: Negative for back pain. Skin: Negative for rash. Neurological: Negative for headaches, weakness or numbness. Psych: No SI or HI. + depression  ____________________________________________   PHYSICAL EXAM:  VITAL SIGNS: ED Triage Vitals  Enc Vitals Group     BP 06/09/20 0203 (!) 170/92     Pulse Rate 06/09/20 0203 61     Resp 06/09/20 0203 18     Temp 06/09/20 0203 97.6 F (36.4 C)     Temp Source 06/09/20 0203 Oral     SpO2 06/09/20 0203 97 %     Weight 06/09/20 0204 110 lb (49.9 kg)     Height 06/09/20 0204 '5\' 2"'  (1.575 m)     Head Circumference --      Peak Flow --      Pain Score --      Pain Loc --      Pain Edu? --      Excl. in [place]? --     Constitutional: Alert and oriented, cachectic, no distress.  HEENT:      Head: Normocephalic and atraumatic.         Eyes: Conjunctivae are  normal. Sclera is non-icteric.       Mouth/Throat: Mucous membranes are dry and cracked lips      Neck: Supple with no signs of meningismus. Cardiovascular: Regular rate and rhythm. No murmurs, gallops, or rubs.  Respiratory: Normal respiratory effort. Lungs are clear to auscultation bilaterally.  Gastrointestinal: Soft, non tender, and non distended Musculoskeletal: No edema, cyanosis, or erythema of extremities. Neurologic: Normal speech and language. Face is symmetric. Moving all extremities. No gross focal neurologic deficits are appreciated. Skin: Skin is warm, dry and intact. No rash noted. Psychiatric: Mood and affect are blunt. Speech and behavior are normal.  ____________________________________________   LABS (all labs ordered are listed, but only abnormal results are displayed)  Labs Reviewed  CBC WITH DIFFERENTIAL/PLATELET - Abnormal; Notable for the following components:      Result Value   RBC 5.15 (*)    All other components within normal limits  COMPREHENSIVE METABOLIC PANEL - Abnormal; Notable for the following components:   Potassium 3.4 (*)    Glucose, Bld 208 (*)    Creatinine, Ser 1.30 (*)    Albumin 3.0 (*)    Total Bilirubin 1.4 (*)    GFR, Estimated 47 (*)    Anion gap 16 (*)    All other components within normal limits  URINALYSIS, COMPLETE (UACMP) WITH MICROSCOPIC - Abnormal; Notable for the following components:   Color, Urine YELLOW (*)    APPearance CLEAR (*)    Hgb urine dipstick SMALL (*)    Ketones, ur 20 (*)    Protein, ur >=300 (*)    All other components within normal limits  CBG MONITORING, ED - Abnormal; Notable for the following components:   Glucose-Capillary 149 (*)    All other components within normal limits  ETHANOL  URINE DRUG SCREEN, QUALITATIVE ([facility] ONLY)   ____________________________________________  EKG  ED ECG REPORT I, Rudene Re, the attending physician, personally viewed and interpreted this ECG.  Sinus  bradycardia, rate of 55, prolonged QTC with no ST elevation. Prolonged QTC is new when compared to prior. ____________________________________________  RADIOLOGY  none  ____________________________________________   PROCEDURES  Procedure(s) performed:yes .1-3 Lead EKG Interpretation Performed by: Rudene Re, MD Authorized by: Rudene Re, MD     Interpretation: non-specific     ECG rate assessment: normal     Rhythm: sinus rhythm     Ectopy: none     Critical Care performed:  None ____________________________________________   INITIAL IMPRESSION / ASSESSMENT AND PLAN / ED COURSE  59 y.o. female with a history of schizophrenia, diabetes, hypertension, chronic kidney disease who presents from home with her sister for failure to thrive. Seems like patient's presentation is most likely due to progression of her mental illness. Patient has no suicidal or homicidal ideation. Failure to thrive, looks very dry cachectic on exam. No indications for involuntary commitment. History is gathered from patient and her sister who was at bedside who is patient's primary caretaker. Sister is extremely overwhelmed and unable to care for patient anymore. She is afebrile, nonfocal exam, hypertensive however has been noncompliant with her medications for over a year. Her EKG shows prolonged QTC which is new. She has mild hypokalemia with a K of 3.4. Will give a dose of IV magnesium and p.o. potassium for the prolonged QT. UA is negative for UTI, drug screen is negative, no anemia or leukocytosis, creatinine is at baseline. Mild anion gap acidosis in the setting of dehydration for which patient will receive a liter of LR. Will consult psychiatry for mental health evaluation. Old medical records reviewed.  _________________________ 5:33 AM on 06/09/2020 -----------------------------------------  After supplementation of potassium magnesium a repeat EKG was done showing normal QTC of 442.  Morphology of the EKG is again unchanged when compared to prior. Patient's BP trending up. She is supposed to be on amlodipine, losartan, and hydrochlorothiazide. Since patient was not on it for at least a year I started with giving her losartan only. Her blood pressure still did not respond so I just ordered amlodipine and hydrochlorothiazide. At this time patient is medically cleared awaiting psychiatric evaluation and possible social work evaluation for placement.   The patient has been placed in psychiatric observation due to the need to provide  a safe environment for the patient while obtaining psychiatric consultation and evaluation, as well as ongoing medical and medication management to treat the patient's condition.  The patient has not been placed under full IVC at this time.     _____________________________________________ Please note:  Patient was evaluated in Emergency Department today for the symptoms described in the history of present illness. Patient was evaluated in the context of the global COVID-19 pandemic, which necessitated consideration that the patient might be at risk for infection with the SARS-CoV-2 virus that causes COVID-19. Institutional protocols and algorithms that pertain to the evaluation of patients at risk for COVID-19 are in a state of rapid change based on information released by regulatory bodies including the CDC and federal and state organizations. These policies and algorithms were followed during the patient's care in the ED.  Some ED evaluations and interventions may be delayed as a result of limited staffing during the pandemic.   [place] Controlled Substance Database was reviewed by me. ____________________________________________   FINAL CLINICAL IMPRESSION(S) / ED DIAGNOSES   Final diagnoses:  Failure to thrive in adult  Schizophrenia, unspecified type ([place])  Dehydration  Prolonged Q-T interval on ECG      NEW MEDICATIONS STARTED DURING THIS  VISIT:  ED Discharge Orders    None       Note:  This document was prepared using Dragon voice recognition software and may include unintentional dictation errors.    Alfred Levins, [place], MD 06/09/20 [place], [place], [place] 06/09/20 [phone]

## 2020-06-09 NOTE — ED Notes (Signed)
Pt noted to have urinated in bed. RN got pt up to bedside commode and pt urinated in bedside commode as well. Pt changed, linen changed. Pt back in bed.

## 2020-06-09 NOTE — BH Assessment (Signed)
Assessment Note  Alicia Rogers is an 59 y.o. female who presents to [facility] ED involuntarily for treatment. Per triage note, Presents due to syncope episode. Pt's sister takes care of her and was giving her a bath at home when she passed out. Per sister, pt has been feeling weak recently and had this episode today. Pt has been refusing to take her meds for the last several months/years.    During TTS assessment pt presents alert, calm, and oriented x 3, some thought blocking, ambivalence, restless but cooperative, and mood-congruent with affect. The pt does not appear to be responding to internal or external stimuli. Neither is the pt presenting with any delusional thinking. Pt verified the information provided to triage RN.Pt identifies her main complaint to be dizziness and depression. Pt reports falling asleep while sitting on the shower stool and waking up being unable to move her body. Pt identifies an increase in sleep, lack of social connections, being unemployed, noncompliance with medications and remaining home majority of her day as triggers to her current depression. Pt reports a decrease in her appetite and some weight loss but to be unsure of how much. Pt denies a SA and INPT hx. Pt reports a previous OPT hx with [facility] and [facility] and denies any current active services. Pt denies a family hx of MH/SA or any current SI/HI/AH/VH. Pt reports to need support with getting back to her "basic self" (working & caring for self independently) and is receptive to her current disposition.   Per Dr. Weber Cooks pt meets criteria for [facility]  Diagnosis: Per hx Schizoaffective Disorder   Past Medical History:  Past Medical History:  Diagnosis Date  . Depression   . Diabetes mellitus without complication ([place])   . Diabetes mellitus, type II ([place])   . Hypertension   . Schizophrenia ([place])     History reviewed. No pertinent surgical history.  Family History:  Family History  Problem Relation Age of  Onset  . Diabetes Mother   . Glaucoma Mother   . Heart disease Mother   . Heart attack Mother   . Depression Mother   . Hypertension Mother   . Diabetes Father   . Glaucoma Father   . Hypertension Father   . Diabetes Sister   . Depression Sister   . Post-traumatic stress disorder Sister     Social History:  reports that she has been smoking cigarettes. She started smoking about 42 years ago. She has been smoking about 1.00 pack per day. She has never used smokeless tobacco. She reports that she does not drink alcohol and does not use drugs.  Additional Social History:  Alcohol / Drug Use Pain Medications: see mar Prescriptions: see mar Over the Counter: see mar History of alcohol / drug use?: No history of alcohol / drug abuse  CIWA: CIWA-Ar BP: (!) 186/79 Pulse Rate: 81 COWS:    Allergies:  Allergies  Allergen Reactions  . Penicillins     Rash    Home Medications: (Not in a hospital admission)   OB/GYN Status:  No LMP recorded. Patient is postmenopausal.  General Assessment Data Location of Assessment: [facility] ED TTS Assessment: In system Is this a Tele or Face-to-Face Assessment?: Face-to-Face Is this an Initial Assessment or a Re-assessment for this encounter?: Initial Assessment Patient Accompanied by:: N/A Language Other than English: No Living Arrangements: Other (Comment) What gender do you identify as?: Female Date Telepsych consult ordered in [facility]: 06/09/20 Time Telepsych consult ordered in [facility]:  0153 Marital status: Long term relationship Maiden name: n/a Pregnancy Status: No Living Arrangements: Other relatives (sister ) Can pt return to current living arrangement?: Yes Admission Status: Voluntary Is patient capable of signing voluntary admission?: Yes Referral Source: Self/Family/Friend Insurance type: Psychologist, prison and probation services      Crisis Care Plan Living Arrangements: Other relatives (sister ) Legal Guardian:  (self) Name of Psychiatrist: None  Name  of Therapist: None   Education Status Is patient currently in school?: No Is the patient employed, unemployed or receiving disability?: Unemployed  Risk to self with the past 6 months Suicidal Ideation: No Has patient been a risk to self within the past 6 months prior to admission? : No Suicidal Intent: No Has patient had any suicidal intent within the past 6 months prior to admission? : No Is patient at risk for suicide?: No Suicidal Plan?: No Has patient had any suicidal plan within the past 6 months prior to admission? : No Access to Means: No What has been your use of drugs/alcohol within the last 12 months?: none reported  Previous Attempts/Gestures: No How many times?: 0 Other Self Harm Risks: None reported  Triggers for Past Attempts: None known Intentional Self Injurious Behavior: None Family Suicide History: No Recent stressful life event(s): Other (Comment) (medical stressors ) Persecutory voices/beliefs?: No Depression: Yes Depression Symptoms: Loss of interest in usual pleasures Substance abuse history and/or treatment for substance abuse?: No Suicide prevention information given to non-admitted patients: Not applicable  Risk to Others within the past 6 months Homicidal Ideation: No Does patient have any lifetime risk of violence toward others beyond the six months prior to admission? : No Thoughts of Harm to Others: No Current Homicidal Intent: No Current Homicidal Plan: No Access to Homicidal Means: No Identified Victim: n/a History of harm to others?: No Assessment of Violence: None Noted Violent Behavior Description: n/a Does patient have access to weapons?: No Criminal Charges Pending?: No Does patient have a court date: No Is patient on probation?: No  Psychosis Hallucinations: None noted Delusions: None noted  Mental Status Report Appearance/Hygiene: In hospital gown Eye Contact: Fair Motor Activity: Freedom of movement, Unsteady Speech:  Logical/coherent, Slow, Soft Level of Consciousness: Alert Mood: Depressed Affect: Depressed Anxiety Level: None Thought Processes: Coherent, Relevant Judgement: Unimpaired Orientation: Appropriate for developmental age Obsessive Compulsive Thoughts/Behaviors: None  Cognitive Functioning Concentration: Normal Memory: Recent Intact, Remote Intact Is patient IDD: No Insight: Poor Impulse Control: Good Appetite: Good Have you had any weight changes? : Loss Amount of the weight change? (lbs):  (pt reports to be unsure ) Sleep: Increased Total Hours of Sleep:  (Pt reports sleeping all day ) Vegetative Symptoms: None  ADLScreening [facility] Assessment Services) Patient's cognitive ability adequate to safely complete daily activities?: Yes Patient able to express need for assistance with ADLs?: Yes Independently performs ADLs?: Yes (appropriate for developmental age) (With some assistance as needed )  Prior Inpatient Therapy Prior Inpatient Therapy: No  Prior Outpatient Therapy Prior Outpatient Therapy: Yes Prior Therapy Dates: 2017 Prior Therapy Facilty/Provider(s): [facility]  Reason for Treatment: Schizoaffective  Does patient have an ACCT team?: No Does patient have Intensive In-House Services?  : No Does patient have [facility] services? : No Does patient have P4CC services?: No  ADL Screening (condition at time of admission) Patient's cognitive ability adequate to safely complete daily activities?: Yes Is the patient deaf or have difficulty hearing?: No Does the patient have difficulty seeing, even when wearing glasses/contacts?: No Does the patient have difficulty concentrating,  remembering, or making decisions?: No Patient able to express need for assistance with ADLs?: Yes Does the patient have difficulty dressing or bathing?: Yes Independently performs ADLs?: Yes (appropriate for developmental age) (With some assistance as needed ) Does the patient have difficulty walking or  climbing stairs?: Yes Weakness of Legs: Both Weakness of Arms/Hands: None  Home Assistive Devices/Equipment Home Assistive Devices/Equipment: None  Therapy Consults (therapy consults require a physician order) PT Evaluation Needed: No OT Evalulation Needed: No SLP Evaluation Needed: No Abuse/Neglect Assessment (Assessment to be complete while patient is alone) Abuse/Neglect Assessment Can Be Completed: Yes Physical Abuse: Denies Verbal Abuse: Denies Sexual Abuse: Denies Exploitation of patient/patient's resources: Denies Self-Neglect: Denies Values / Beliefs Cultural Requests During Hospitalization: None Consults Spiritual Care Consult Needed: No Transition of Care Team Consult Needed: No Advance Directives (For Healthcare) Does Patient Have a Medical Advance Directive?: No          Disposition:  Disposition Initial Assessment Completed for this Encounter: Yes Patient referred to:  Lorrin Goodell psych )  On Site Evaluation by:   Reviewed with Physician:    Shanon Ace 06/09/2020 11:53 AM

## 2020-06-09 NOTE — ED Notes (Signed)
Gave food tray with juice. 

## 2020-06-09 NOTE — ED Triage Notes (Signed)
Pt arrival from home via POV due to syncope episode. Pt did not fall or hit head due to sister being with her.  Pt's sister takes care of her and was giving her a bath at home when she passed out. Per sister, pt has been feeling weak recently and had this episode today. Pt has been refusing to take her meds for the last several months/years.   Per sister, pt needs to be mentally evaluated due to not taking meds. On arrival, patient's RA sats are 82% but then increased to 93% still on RA.   Pt baseline is 'verbal when she wants to be' per sister.

## 2020-06-10 LAB — CBG MONITORING, ED: Glucose-Capillary: 100 mg/dL — ABNORMAL HIGH (ref 70–99)

## 2020-06-10 NOTE — ED Provider Notes (Signed)
Emergency Medicine Observation Re-evaluation Note  Alicia Rogers is a 59 y.o. female, seen on rounds today.  Pt initially presented to the ED for complaints of Loss of Consciousness and Failure To Thrive Currently, the patient is resting.  Physical Exam  BP (!) 185/73 (BP Location: Right Arm)   Pulse 60   Temp 98.7 F (37.1 C) (Oral)   Resp 14   Ht 1.575 m (5\' 2" )   Wt 36.3 kg   SpO2 99%   BMI 14.63 kg/m  Physical Exam Gen:  No acute distress Resp:  Breathing easily and comfortably, no accessory muscle usage Neuro:  Moving all four extremities, no gross focal neuro deficits Psych:  Resting currently, calm and cooperative when awake  ED Course / MDM     I have reviewed the labs performed to date as well as medications administered while in observation.  Recent changes in the last 24 hours include placement.  Plan  Current plan is for transfer to [facility] psychiatric facility when transportation is available. Patient is not under full IVC at this time.   Hinda Kehr, MD 06/10/20 [phone]

## 2020-06-10 NOTE — BH Assessment (Signed)
TTS was contacted by [employer] [facility]) to confirm pt's transportation for today and requested to be faxed pt's IVC paperwork. TTS confirmed the information and agreed to complete the task before pt's is discharged.   TTS updated Annet[employer] Control and instrumentation engineer) and Dr. Weber Cooks.

## 2020-06-10 NOTE — ED Notes (Signed)
Pt given orange juice.

## 2020-07-01 ENCOUNTER — Encounter: Payer: Self-pay | Admitting: Emergency Medicine

## 2020-07-01 ENCOUNTER — Other Ambulatory Visit: Payer: Self-pay

## 2020-07-01 ENCOUNTER — Emergency Department
Admission: EM | Admit: 2020-07-01 | Discharge: 2020-07-02 | Disposition: A | Discharge status: Home / Self Care | Payer: Medicare HMO | Attending: Emergency Medicine | Admitting: Emergency Medicine

## 2020-07-01 DIAGNOSIS — F259 Schizoaffective disorder, unspecified: Secondary | ICD-10-CM | POA: Diagnosis not present

## 2020-07-01 DIAGNOSIS — Z794 Long term (current) use of insulin: Secondary | ICD-10-CM | POA: Diagnosis not present

## 2020-07-01 DIAGNOSIS — I129 Hypertensive chronic kidney disease with stage 1 through stage 4 chronic kidney disease, or unspecified chronic kidney disease: Secondary | ICD-10-CM | POA: Diagnosis not present

## 2020-07-01 DIAGNOSIS — N1831 Chronic kidney disease, stage 3a: Secondary | ICD-10-CM | POA: Diagnosis not present

## 2020-07-01 DIAGNOSIS — F17210 Nicotine dependence, cigarettes, uncomplicated: Secondary | ICD-10-CM | POA: Diagnosis not present

## 2020-07-01 DIAGNOSIS — Z7982 Long term (current) use of aspirin: Secondary | ICD-10-CM | POA: Insufficient documentation

## 2020-07-01 DIAGNOSIS — R300 Dysuria: Secondary | ICD-10-CM | POA: Diagnosis not present

## 2020-07-01 DIAGNOSIS — Z79899 Other long term (current) drug therapy: Secondary | ICD-10-CM | POA: Diagnosis not present

## 2020-07-01 DIAGNOSIS — Z760 Encounter for issue of repeat prescription: Secondary | ICD-10-CM

## 2020-07-01 DIAGNOSIS — Z7984 Long term (current) use of oral hypoglycemic drugs: Secondary | ICD-10-CM | POA: Insufficient documentation

## 2020-07-01 DIAGNOSIS — E1122 Type 2 diabetes mellitus with diabetic chronic kidney disease: Secondary | ICD-10-CM | POA: Insufficient documentation

## 2020-07-01 DIAGNOSIS — I10 Essential (primary) hypertension: Secondary | ICD-10-CM

## 2020-07-01 DIAGNOSIS — E119 Type 2 diabetes mellitus without complications: Secondary | ICD-10-CM

## 2020-07-01 LAB — URINALYSIS, COMPLETE (UACMP) WITH MICROSCOPIC
Bilirubin Urine: NEGATIVE
Glucose, UA: NEGATIVE mg/dL
Hgb urine dipstick: NEGATIVE
Ketones, ur: NEGATIVE mg/dL
Leukocytes,Ua: NEGATIVE
Nitrite: NEGATIVE
Protein, ur: >=300 mg/dL — AB
Specific Gravity, Urine: 1.014 (ref 1.005–1.030)
pH: 5 (ref 5.0–8.0)

## 2020-07-01 LAB — BASIC METABOLIC PANEL
Anion gap: 11 (ref 5–15)
BUN: 35 mg/dL — ABNORMAL HIGH (ref 6–20)
CO2: 25 mmol/L (ref 22–32)
Calcium: 9.2 mg/dL (ref 8.9–10.3)
Chloride: 103 mmol/L (ref 98–111)
Creatinine, Ser: 1.71 mg/dL — ABNORMAL HIGH (ref 0.44–1.00)
GFR, Estimated: 34 mL/min — ABNORMAL LOW (ref >60)
Glucose, Bld: 130 mg/dL — ABNORMAL HIGH (ref 70–99)
Potassium: 3.8 mmol/L (ref 3.5–5.1)
Sodium: 139 mmol/L (ref 135–145)

## 2020-07-01 LAB — CBC
HCT: 30.3 % — ABNORMAL LOW (ref 36.0–46.0)
Hemoglobin: 9.6 g/dL — ABNORMAL LOW (ref 12.0–15.0)
MCH: 27.9 pg (ref 26.0–34.0)
MCHC: 31.7 g/dL (ref 30.0–36.0)
MCV: 88.1 fL (ref 80.0–100.0)
Platelets: 452 10*3/uL — ABNORMAL HIGH (ref 150–400)
RBC: 3.44 MIL/uL — ABNORMAL LOW (ref 3.87–5.11)
RDW: 15.2 % (ref 11.5–15.5)
WBC: 9.2 10*3/uL (ref 4.0–10.5)
nRBC: 0 % (ref 0.0–0.2)

## 2020-07-01 NOTE — ED Triage Notes (Signed)
Pt arrived via POV with reports of having burning with urination and also needs medications refilled. Pt states she has appt on 12/20 but states she won't have enough meds to get her through until her appt.  Pt is not exactly sure what medications she needs.  Pt also states about a month ago she was dx with bladder infection and was given atbx but lost them and didn't get any more from her doctor.   Denies any N/V

## 2020-07-02 MED ORDER — AMLODIPINE BESYLATE 10 MG PO TABS
10.0000 mg | ORAL_TABLET | Freq: Every day | ORAL | 1 refills | Status: DC
Start: 2020-07-02 — End: 2021-09-27

## 2020-07-02 MED ORDER — OMEPRAZOLE 20 MG PO CPDR: 20.0000 mg | DELAYED_RELEASE_CAPSULE | Freq: Every day | ORAL | 1 refills | Status: DC

## 2020-07-02 MED ORDER — LURASIDONE HCL 40 MG PO TABS
40.0000 mg | ORAL_TABLET | Freq: Every day | ORAL | 0 refills | Status: DC
Start: 2020-07-02 — End: 2021-09-29

## 2020-07-02 MED ORDER — LOSARTAN POTASSIUM 100 MG PO TABS
100.0000 mg | ORAL_TABLET | Freq: Every day | ORAL | 1 refills | Status: DC
Start: 2020-07-02 — End: 2021-09-27

## 2020-07-02 MED ORDER — TRAZODONE HCL 50 MG PO TABS: 50.0000 mg | ORAL_TABLET | Freq: Every day | ORAL | 1 refills | Status: DC

## 2020-07-02 MED ORDER — ATORVASTATIN CALCIUM 80 MG PO TABS: 40.0000 mg | ORAL_TABLET | Freq: Every day | ORAL | 1 refills | Status: DC

## 2020-07-02 MED ORDER — OXYBUTYNIN CHLORIDE ER 15 MG PO TB24: 15.0000 mg | ORAL_TABLET | Freq: Every day | ORAL | 1 refills | Status: DC

## 2020-07-02 MED ORDER — SITAGLIPTIN PHOSPHATE 100 MG PO TABS
100.0000 mg | ORAL_TABLET | Freq: Every day | ORAL | 1 refills | Status: DC
Start: 2020-07-02 — End: 2021-09-29

## 2020-07-02 MED ORDER — HYDROXYZINE HCL 25 MG PO TABS: 25.0000 mg | ORAL_TABLET | Freq: Three times a day (TID) | ORAL | 0 refills | Status: DC | PRN

## 2020-07-02 MED ORDER — METFORMIN HCL ER 500 MG PO TB24: 1000.0000 mg | ORAL_TABLET | Freq: Every day | ORAL | 1 refills | Status: DC

## 2020-07-02 NOTE — ED Provider Notes (Addendum)
[facility] Emergency Department Provider Note  ____________________________________________  Time seen: Approximately 12:52 AM  I have reviewed the triage vital signs and the nursing notes.   HISTORY  Chief Complaint Dysuria and Medication Refill    HPI Alicia Rogers is a 59 y.o. female with a history of diabetes hypertension and schizophrenia who comes ED complaining of dysuria which has been ongoing for several weeks. She was treated with antibiotics a couple of weeks ago. Denies fevers chills or back pain. Eating and drinking normally, no other complaints, does request medication refill because she is out of some of her medicines but not sure which ones. She does recall that she seems to have amlodipine. She has a follow-up appoint with her doctor on December 20. She sees [facility] family medicine. Denies any vaginal itching or discharge. No abdominal pain      Past Medical History:  Diagnosis Date  . Depression   . Diabetes mellitus without complication ([place])   . Diabetes mellitus, type II ([place])   . Hypertension   . Schizophrenia [facility])      Patient Active Problem List   Diagnosis Date Noted  . Hypertensive urgency 02/28/2020  . Back pain 02/28/2020  . CKD (chronic kidney disease), stage IIIa 02/28/2020  . Hypokalemia 02/28/2020  . Elevated troponin 02/28/2020  . Hematuria 02/28/2020  . Tobacco abuse 02/28/2020  . Dizziness 02/28/2020  . HLD (hyperlipidemia) 02/28/2020  . Type II diabetes mellitus with renal manifestations ([place]) 02/28/2020  . Compulsive tobacco user syndrome 10/11/2014  . BP (high blood pressure) 09/04/2014  . 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  . Claudication ([place]) 06/09/2014  . Schizoaffective disorder, depressive type ([place]) 06/09/2014  . Diabetes mellitus, type 2 ([place]) 06/09/2014  . Type 2 diabetes mellitus ([place]) 06/09/2014  . Controlled type 2 diabetes mellitus without complication ([place])  25/12/3974     History reviewed. No pertinent surgical history.   Prior to Admission medications   Medication Sig Start Date End Date Taking? Authorizing Provider  ACCU-CHEK FASTCLIX LANCETS [place]  02/17/15   [provider]  ACCU-CHEK SMARTVIEW test strip  04/02/15   [provider]  amLODipine (NORVASC) 10 MG tablet Take 1 tablet (10 mg total) by mouth daily. 07/02/20   Carrie Mew, MD  aspirin EC 81 MG EC tablet Take 1 tablet (81 mg total) by mouth daily. Swallow whole. 03/01/20   Ezekiel Slocumb, DO  atorvastatin (LIPITOR) 80 MG tablet Take 0.5 tablets (40 mg total) by mouth daily at 6 PM. 07/02/20   Carrie Mew, MD  Blood Glucose Monitoring Suppl (FIFTY50 GLUCOSE METER 2.0) W/DEVICE KIT Check blood sugar 3 times a day. Dx E11.9 07/04/14   [provider]  hydrochlorothiazide (HYDRODIURIL) 12.5 MG tablet Take 12.5 mg by mouth daily. 12/06/19   [provider]  hydrOXYzine (ATARAX/VISTARIL) 25 MG tablet Take 1 tablet (25 mg total) by mouth 3 (three) times daily as needed for anxiety. 07/02/20   Carrie Mew, MD  Lancet Devices (RELION LANCING DEVICE) [place]  05/09/14   [provider]  losartan (COZAAR) 100 MG tablet Take 1 tablet (100 mg total) by mouth daily. 07/02/20   Carrie Mew, MD  lurasidone (LATUDA) 40 MG TABS tablet Take 1 tablet (40 mg total) by mouth daily with breakfast. 07/02/20   Carrie Mew, MD  metFORMIN (GLUCOPHAGE XR) 500 MG 24 hr tablet Take 2 tablets (1,000 mg total) by mouth daily with breakfast. 07/02/20 07/02/21  Carrie Mew, MD  methocarbamol (  ROBAXIN) 500 MG tablet Take 1 tablet (500 mg total) by mouth every 8 (eight) hours as needed for muscle spasms. 02/29/20   Ezekiel Slocumb, DO  omeprazole (PRILOSEC) 20 MG capsule Take 1 capsule (20 mg total) by mouth daily. 07/02/20 07/02/21  Carrie Mew, MD  oxybutynin (DITROPAN XL) 15 MG 24 hr tablet Take 1 tablet (15 mg total) by mouth at bedtime.  07/02/20   Carrie Mew, MD  sitaGLIPtin (JANUVIA) 100 MG tablet Take 1 tablet (100 mg total) by mouth daily. 07/02/20   Carrie Mew, MD  traZODone (DESYREL) 50 MG tablet Take 1 tablet (50 mg total) by mouth at bedtime. 07/02/20   Carrie Mew, MD     Allergies Penicillins   Family History  Problem Relation Age of Onset  . Diabetes Mother   . Glaucoma Mother   . Heart disease Mother   . Heart attack Mother   . Depression Mother   . Hypertension Mother   . Diabetes Father   . Glaucoma Father   . Hypertension Father   . Diabetes Sister   . Depression Sister   . Post-traumatic stress disorder Sister     Social History Social History   Tobacco Use  . Smoking status: Current Every Day Smoker    Packs/day: 1.00    Types: Cigarettes    Start date: 04/21/1978  . Smokeless tobacco: Never Used  Vaping Use  . Vaping Use: Never used  Substance Use Topics  . Alcohol use: No    Alcohol/week: 0.0 standard drinks  . Drug use: No    Review of Systems  Constitutional:   No fever or chills.  ENT:   No sore throat. No rhinorrhea. Cardiovascular:   No chest pain or syncope. Respiratory:   No dyspnea or cough. Gastrointestinal:   Negative for abdominal pain, vomiting and diarrhea.  Musculoskeletal:   Negative for focal pain or swelling All other systems reviewed and are negative except as documented above in ROS and HPI.  ____________________________________________   PHYSICAL EXAM:  VITAL SIGNS: ED Triage Vitals  Enc Vitals Group     BP 07/01/20 1923 140/61     Pulse Rate 07/01/20 1923 96     Resp 07/01/20 1923 18     Temp 07/01/20 1923 99 F (37.2 C)     Temp Source 07/01/20 1923 Oral     SpO2 07/01/20 1923 100 %     Weight 07/01/20 1928 80 lb (36.3 kg)     Height 07/01/20 1928 5' 2" (1.575 m)     Head Circumference --      Peak Flow --      Pain Score 07/01/20 1928 0     Pain Loc --      Pain Edu? --      Excl. in [place]? --     Vital signs reviewed,  nursing assessments reviewed.   Constitutional:   Alert and oriented. Non-toxic appearance. Eyes:   Conjunctivae are normal. EOMI. PERRL. ENT      Head:   Normocephalic and atraumatic.      Nose:   Wearing a mask.      Mouth/Throat:   Wearing a mask.      Neck:   No meningismus. Full ROM. Hematological/Lymphatic/Immunilogical:   No cervical lymphadenopathy. Cardiovascular:   RRR. Symmetric bilateral radial and DP pulses.  No murmurs. Cap refill less than 2 seconds. Respiratory:   Normal respiratory effort without tachypnea/retractions. Breath sounds are clear and equal bilaterally. No wheezes/rales/rhonchi.  Gastrointestinal:   Soft and nontender. Non distended. There is no CVA tenderness.  No rebound, rigidity, or guarding. Musculoskeletal:   Normal range of motion in all extremities. No joint effusions.  No lower extremity tenderness.  No edema. Neurologic:   Normal speech and language.  Motor grossly intact. No acute focal neurologic deficits are appreciated.  Skin:    Skin is warm, dry and intact. No rash noted.  No petechiae, purpura, or bullae.  ____________________________________________    LABS (pertinent positives/negatives) (all labs ordered are listed, but only abnormal results are displayed) Labs Reviewed  URINALYSIS, COMPLETE (UACMP) WITH MICROSCOPIC - Abnormal; Notable for the following components:      Result Value   Color, Urine YELLOW (*)    APPearance CLEAR (*)    Protein, ur >=300 (*)    Bacteria, UA RARE (*)    All other components within normal limits  CBC - Abnormal; Notable for the following components:   RBC 3.44 (*)    Hemoglobin 9.6 (*)    HCT 30.3 (*)    Platelets 452 (*)    All other components within normal limits  BASIC METABOLIC PANEL - Abnormal; Notable for the following components:   Glucose, Bld 130 (*)    BUN 35 (*)    Creatinine, Ser 1.71 (*)    GFR, Estimated 34 (*)    All other components within normal limits    ____________________________________________   EKG   ____________________________________________    RADIOLOGY  No results found.  ____________________________________________   PROCEDURES Procedures  ____________________________________________    CLINICAL IMPRESSION / ASSESSMENT AND PLAN / ED COURSE  Medications ordered in the ED: Medications - No data to display  Pertinent labs & imaging results that were available during my care of the patient were reviewed by me and considered in my medical decision making (see chart for details).  Alicia Rogers was evaluated in Emergency Department on 07/02/2020 for the symptoms described in the history of present illness. She was evaluated in the context of the global COVID-19 pandemic, which necessitated consideration that the patient might be at risk for infection with the SARS-CoV-2 virus that causes COVID-19. Institutional protocols and algorithms that pertain to the evaluation of patients at risk for COVID-19 are in a state of rapid change based on information released by regulatory bodies including the CDC and federal and state organizations. These policies and algorithms were followed during the patient's care in the ED.     Clinical Course as of Jul 03 51  Wed Jul 02, 2020  0029 Patient complains of dysuria, exam is reassuring, vital signs unremarkable, labs unremarkable with some slight elevation of creatinine on top of CKD.  Patient is sitting upright, eating a sandwich and drinking fluids, encouraged her to increase water intake.  No evidence of UTI today.  Hemoglobin of 9.6 is similar to recent baseline of 11, doubt GI bleed, no black or bloody stool or dizziness   [PS]  0030 By report patient is in need of some medication refills.  She is unable to recall what she might have or what she might be out of, so I will provide prescriptions for her home med list.  She confirms that she sees [facility] family medicine and that  that medication regimen is up-to-date.   [PS]    Clinical Course User Index [PS] Carrie Mew, MD     ____________________________________________   FINAL CLINICAL IMPRESSION(S) / ED DIAGNOSES    Final diagnoses:  Dysuria  Type 2 diabetes mellitus without complication, without long-term current use of insulin (HCC)  Hypertension, unspecified type  Schizoaffective disorder, unspecified type ([place])  Medication refill     ED Discharge Orders         Ordered    amLODipine (NORVASC) 10 MG tablet  Daily        07/02/20 0051    atorvastatin (LIPITOR) 80 MG tablet  Daily-1800        07/02/20 0051    losartan (COZAAR) 100 MG tablet  Daily        07/02/20 0051    sitaGLIPtin (JANUVIA) 100 MG tablet  Daily        07/02/20 0051    omeprazole (PRILOSEC) 20 MG capsule  Daily        07/02/20 0051    metFORMIN (GLUCOPHAGE XR) 500 MG 24 hr tablet  Daily with breakfast        07/02/20 0051    oxybutynin (DITROPAN XL) 15 MG 24 hr tablet  Daily at bedtime        07/02/20 0051    traZODone (DESYREL) 50 MG tablet  Daily at bedtime        07/02/20 0051    hydrOXYzine (ATARAX/VISTARIL) 25 MG tablet  3 times daily PRN        07/02/20 0051    lurasidone (LATUDA) 40 MG TABS tablet  Daily with breakfast        07/02/20 0051          Portions of this note were generated with dragon dictation software. Dictation errors may occur despite best attempts at proofreading.   Carrie Mew, MD 07/02/20 [place], [place], MD 07/02/20 [phone]

## 2020-07-02 NOTE — ED Notes (Signed)
Pt given food and drink at this time.

## 2020-07-16 ENCOUNTER — Other Ambulatory Visit: Payer: Self-pay

## 2020-07-16 ENCOUNTER — Emergency Department
Admission: EM | Admit: 2020-07-16 | Discharge: 2020-07-16 | Disposition: A | Discharge status: Home / Self Care | Payer: Medicare HMO | Attending: Emergency Medicine | Admitting: Emergency Medicine

## 2020-07-16 ENCOUNTER — Emergency Department: Payer: Medicare HMO

## 2020-07-16 ENCOUNTER — Encounter: Payer: Self-pay | Admitting: Emergency Medicine

## 2020-07-16 DIAGNOSIS — E1122 Type 2 diabetes mellitus with diabetic chronic kidney disease: Secondary | ICD-10-CM | POA: Insufficient documentation

## 2020-07-16 DIAGNOSIS — R300 Dysuria: Secondary | ICD-10-CM | POA: Diagnosis not present

## 2020-07-16 DIAGNOSIS — F419 Anxiety disorder, unspecified: Secondary | ICD-10-CM

## 2020-07-16 DIAGNOSIS — G8929 Other chronic pain: Secondary | ICD-10-CM | POA: Diagnosis not present

## 2020-07-16 DIAGNOSIS — Z7984 Long term (current) use of oral hypoglycemic drugs: Secondary | ICD-10-CM | POA: Insufficient documentation

## 2020-07-16 DIAGNOSIS — I129 Hypertensive chronic kidney disease with stage 1 through stage 4 chronic kidney disease, or unspecified chronic kidney disease: Secondary | ICD-10-CM | POA: Diagnosis not present

## 2020-07-16 DIAGNOSIS — Z79899 Other long term (current) drug therapy: Secondary | ICD-10-CM | POA: Insufficient documentation

## 2020-07-16 DIAGNOSIS — R419 Unspecified symptoms and signs involving cognitive functions and awareness: Secondary | ICD-10-CM | POA: Insufficient documentation

## 2020-07-16 DIAGNOSIS — F17210 Nicotine dependence, cigarettes, uncomplicated: Secondary | ICD-10-CM | POA: Diagnosis not present

## 2020-07-16 DIAGNOSIS — N183 Chronic kidney disease, stage 3 unspecified: Secondary | ICD-10-CM | POA: Diagnosis not present

## 2020-07-16 DIAGNOSIS — R42 Dizziness and giddiness: Secondary | ICD-10-CM | POA: Diagnosis not present

## 2020-07-16 DIAGNOSIS — Z7982 Long term (current) use of aspirin: Secondary | ICD-10-CM | POA: Diagnosis not present

## 2020-07-16 DIAGNOSIS — M549 Dorsalgia, unspecified: Secondary | ICD-10-CM | POA: Diagnosis present

## 2020-07-16 DIAGNOSIS — M545 Low back pain, unspecified: Secondary | ICD-10-CM | POA: Diagnosis not present

## 2020-07-16 DIAGNOSIS — Z20822 Contact with and (suspected) exposure to covid-19: Secondary | ICD-10-CM | POA: Insufficient documentation

## 2020-07-16 LAB — COMPREHENSIVE METABOLIC PANEL
ALT: 12 U/L (ref 0–44)
AST: 15 U/L (ref 15–41)
Albumin: 3.1 g/dL — ABNORMAL LOW (ref 3.5–5.0)
Alkaline Phosphatase: 87 U/L (ref 38–126)
Anion gap: 11 (ref 5–15)
BUN: 20 mg/dL (ref 6–20)
CO2: 26 mmol/L (ref 22–32)
Calcium: 9.4 mg/dL (ref 8.9–10.3)
Chloride: 106 mmol/L (ref 98–111)
Creatinine, Ser: 0.95 mg/dL (ref 0.44–1.00)
GFR, Estimated: >60 mL/min (ref >60)
Glucose, Bld: 162 mg/dL — ABNORMAL HIGH (ref 70–99)
Potassium: 4.2 mmol/L (ref 3.5–5.1)
Sodium: 143 mmol/L (ref 135–145)
Total Bilirubin: 0.6 mg/dL (ref 0.3–1.2)
Total Protein: 8 g/dL (ref 6.5–8.1)

## 2020-07-16 LAB — CBC WITH DIFFERENTIAL/PLATELET
Abs Immature Granulocytes: 0.03 10*3/uL (ref 0.00–0.07)
Basophils Absolute: 0.1 10*3/uL (ref 0.0–0.1)
Basophils Relative: 1 %
Eosinophils Absolute: 0.1 10*3/uL (ref 0.0–0.5)
Eosinophils Relative: 1 %
HCT: 31.6 % — ABNORMAL LOW (ref 36.0–46.0)
Hemoglobin: 9.9 g/dL — ABNORMAL LOW (ref 12.0–15.0)
Immature Granulocytes: 0 %
Lymphocytes Relative: 23 %
Lymphs Abs: 2 10*3/uL (ref 0.7–4.0)
MCH: 28 pg (ref 26.0–34.0)
MCHC: 31.3 g/dL (ref 30.0–36.0)
MCV: 89.3 fL (ref 80.0–100.0)
Monocytes Absolute: 0.9 10*3/uL (ref 0.1–1.0)
Monocytes Relative: 10 %
Neutro Abs: 5.4 10*3/uL (ref 1.7–7.7)
Neutrophils Relative %: 65 %
Platelets: 331 10*3/uL (ref 150–400)
RBC: 3.54 MIL/uL — ABNORMAL LOW (ref 3.87–5.11)
RDW: 15.5 % (ref 11.5–15.5)
WBC: 8.3 10*3/uL (ref 4.0–10.5)
nRBC: 0 % (ref 0.0–0.2)

## 2020-07-16 LAB — RESP PANEL BY RT-PCR (FLU A&B, COVID) ARPGX2
Influenza A by PCR: NEGATIVE
Influenza B by PCR: NEGATIVE
SARS Coronavirus 2 by RT PCR: NEGATIVE

## 2020-07-16 LAB — URINALYSIS, COMPLETE (UACMP) WITH MICROSCOPIC
Bilirubin Urine: NEGATIVE
Glucose, UA: NEGATIVE mg/dL
Hgb urine dipstick: NEGATIVE
Ketones, ur: NEGATIVE mg/dL
Leukocytes,Ua: NEGATIVE
Nitrite: NEGATIVE
Protein, ur: >=300 mg/dL — AB
Specific Gravity, Urine: 1.017 (ref 1.005–1.030)
pH: 5 (ref 5.0–8.0)

## 2020-07-16 LAB — BRAIN NATRIURETIC PEPTIDE: B Natriuretic Peptide: 35.7 pg/mL (ref 0.0–100.0)

## 2020-07-16 LAB — TROPONIN I (HIGH SENSITIVITY): Troponin I (High Sensitivity): 8 ng/L (ref <18)

## 2020-07-16 MED ORDER — METHOCARBAMOL 500 MG PO TABS: 500.0000 mg | ORAL_TABLET | Freq: Three times a day (TID) | ORAL | 0 refills | Status: DC | PRN

## 2020-07-16 MED ORDER — LIDOCAINE 5 % EX PTCH: 1.0000 | MEDICATED_PATCH | Freq: Two times a day (BID) | CUTANEOUS | 0 refills | Status: DC | PRN

## 2020-07-16 MED ORDER — ACETAMINOPHEN 500 MG PO TABS
1000.0000 mg | ORAL_TABLET | Freq: Once | ORAL | Status: AC
  Administered 2020-07-16: 21:00:00 1000 mg via ORAL
  Filled 2020-07-16: qty 2

## 2020-07-16 MED ORDER — LIDOCAINE 5 % EX PTCH
1.0000 | MEDICATED_PATCH | CUTANEOUS | Status: DC
  Administered 2020-07-16: 21:00:00 1 via TRANSDERMAL
  Filled 2020-07-16: qty 1

## 2020-07-16 NOTE — ED Triage Notes (Signed)
Pt comes into the ED via POV c/o anxiety and back pain.  Pt states she did fall a couple weeks ago.  Pt states its her entire back that hurts and she cant differentiate the pain between lower, mid and upper.  Pt also states she has been struggling with increased anxieties. Pt denies any difficulty with urination.

## 2020-07-16 NOTE — ED Notes (Signed)
Pt given a sandwich tray and a cup of ginger ale.

## 2020-07-16 NOTE — Discharge Instructions (Signed)
Take the meds as prescribed. Follow-up with your provider as needed.

## 2020-07-16 NOTE — ED Provider Notes (Signed)
[facility] Emergency Department Provider Note  ____________________________________________   Event Date/Time   First MD Initiated Contact with Patient 07/16/20 1914     (approximate)  I have reviewed the triage vital signs and the nursing notes.   HISTORY  Chief Complaint Anxiety and Back Pain   HPI Alicia Rogers is a 59 y.o. female with a past medical history of DM, HTN, depression, schizophrenia, and tobacco abuse who presents for assessment of multiple complaints including dizziness, generalized back pain, increased urinary frequency is associate with some burning and general malaise.  Patient also states she fell a couple of weeks ago and again last week.  She is not sure if she tripped or slipped or felt dizzy beforehand.  She states she has been feeling a little dizzy over the last couple days but has not had any subsequent loss of consciousness.  She states she has a chronic cough has not changed at all in intensity or frequency.  She denies any acute shortness of breath, chest pain, vision changes, headache, earache, sore throat, extremity pain weakness numbness or tingling, abdominal pain or other acute complaints.  She denies any illicit drug use, cancer history or recent steroid use.  She denies any urine incontinence.  She is not sure if she injured her back when she fell or when her back pain began.         Past Medical History:  Diagnosis Date  . Depression   . Diabetes mellitus without complication ([place])   . Diabetes mellitus, type II ([place])   . Hypertension   . Schizophrenia [facility])     Patient Active Problem List   Diagnosis Date Noted  . Hypertensive urgency 02/28/2020  . Back pain 02/28/2020  . CKD (chronic kidney disease), stage IIIa 02/28/2020  . Hypokalemia 02/28/2020  . Elevated troponin 02/28/2020  . Hematuria 02/28/2020  . Tobacco abuse 02/28/2020  . Dizziness 02/28/2020  . HLD (hyperlipidemia) 02/28/2020  . Type II  diabetes mellitus with renal manifestations ([place]) 02/28/2020  . Compulsive tobacco user syndrome 10/11/2014  . BP (high blood pressure) 09/04/2014  . 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  . Claudication ([place]) 06/09/2014  . Schizoaffective disorder, depressive type ([place]) 06/09/2014  . Diabetes mellitus, type 2 ([place]) 06/09/2014  . Type 2 diabetes mellitus ([place]) 06/09/2014  . Controlled type 2 diabetes mellitus without complication ([place]) 15/94/5859    History reviewed. No pertinent surgical history.  Prior to Admission medications   Medication Sig Start Date End Date Taking? Authorizing Provider  ACCU-CHEK FASTCLIX LANCETS [place]  02/17/15   [provider]  ACCU-CHEK SMARTVIEW test strip  04/02/15   [provider]  amLODipine (NORVASC) 10 MG tablet Take 1 tablet (10 mg total) by mouth daily. 07/02/20   Carrie Mew, MD  aspirin EC 81 MG EC tablet Take 1 tablet (81 mg total) by mouth daily. Swallow whole. 03/01/20   Ezekiel Slocumb, DO  atorvastatin (LIPITOR) 80 MG tablet Take 0.5 tablets (40 mg total) by mouth daily at 6 PM. 07/02/20   Carrie Mew, MD  Blood Glucose Monitoring Suppl (FIFTY50 GLUCOSE METER 2.0) W/DEVICE KIT Check blood sugar 3 times a day. Dx E11.9 07/04/14   [provider]  hydrochlorothiazide (HYDRODIURIL) 12.5 MG tablet Take 12.5 mg by mouth daily. 12/06/19   [provider]  hydrOXYzine (ATARAX/VISTARIL) 25 MG tablet Take 1 tablet (25 mg total) by mouth 3 (three) times daily as needed for anxiety. 07/02/20   Joni Fears,  Doren Custard, MD  Lancet Devices (RELION LANCING DEVICE) [place]  05/09/14   [provider]  losartan (COZAAR) 100 MG tablet Take 1 tablet (100 mg total) by mouth daily. 07/02/20   Carrie Mew, MD  lurasidone (LATUDA) 40 MG TABS tablet Take 1 tablet (40 mg total) by mouth daily with breakfast. 07/02/20   Carrie Mew, MD  metFORMIN (GLUCOPHAGE XR) 500 MG 24 hr tablet Take 2 tablets  (1,000 mg total) by mouth daily with breakfast. 07/02/20 07/02/21  Carrie Mew, MD  methocarbamol (ROBAXIN) 500 MG tablet Take 1 tablet (500 mg total) by mouth every 8 (eight) hours as needed for muscle spasms. 02/29/20   Ezekiel Slocumb, DO  omeprazole (PRILOSEC) 20 MG capsule Take 1 capsule (20 mg total) by mouth daily. 07/02/20 07/02/21  Carrie Mew, MD  oxybutynin (DITROPAN XL) 15 MG 24 hr tablet Take 1 tablet (15 mg total) by mouth at bedtime. 07/02/20   Carrie Mew, MD  sitaGLIPtin (JANUVIA) 100 MG tablet Take 1 tablet (100 mg total) by mouth daily. 07/02/20   Carrie Mew, MD  traZODone (DESYREL) 50 MG tablet Take 1 tablet (50 mg total) by mouth at bedtime. 07/02/20   Carrie Mew, MD    Allergies Penicillins  Family History  Problem Relation Age of Onset  . Diabetes Mother   . Glaucoma Mother   . Heart disease Mother   . Heart attack Mother   . Depression Mother   . Hypertension Mother   . Diabetes Father   . Glaucoma Father   . Hypertension Father   . Diabetes Sister   . Depression Sister   . Post-traumatic stress disorder Sister     Social History Social History   Tobacco Use  . Smoking status: Current Every Day Smoker    Packs/day: 1.00    Types: Cigarettes    Start date: 04/21/1978  . Smokeless tobacco: Never Used  Vaping Use  . Vaping Use: Never used  Substance Use Topics  . Alcohol use: No    Alcohol/week: 0.0 standard drinks  . Drug use: No    Review of Systems  Review of Systems  Constitutional: Positive for malaise/fatigue. Negative for chills and fever.  HENT: Negative for sore throat.   Eyes: Negative for pain.  Respiratory: Positive for cough. Negative for stridor.   Cardiovascular: Negative for chest pain.  Gastrointestinal: Positive for diarrhea and nausea. Negative for vomiting.  Genitourinary: Positive for dysuria and frequency.  Musculoskeletal: Positive for back pain and falls.  Skin: Negative for rash.   Neurological: Positive for dizziness. Negative for seizures, loss of consciousness and headaches.  Psychiatric/Behavioral: Negative for suicidal ideas.  All other systems reviewed and are negative.     ____________________________________________   PHYSICAL EXAM:  VITAL SIGNS: ED Triage Vitals  Enc Vitals Group     BP 07/16/20 1652 (!) 152/53     Pulse Rate 07/16/20 1652 72     Resp 07/16/20 1652 15     Temp 07/16/20 1652 99.3 F (37.4 C)     Temp Source 07/16/20 1652 Oral     SpO2 07/16/20 1652 100 %     Weight 07/16/20 1652 80 lb (36.3 kg)     Height 07/16/20 1652 '5\' 2"'  (1.575 m)     Head Circumference --      Peak Flow --      Pain Score 07/16/20 1658 10     Pain Loc --      Pain Edu? --  Excl. in [place]? --    Vitals:   07/16/20 1652 07/16/20 2003  BP: (!) 152/53 (!) 141/60  Pulse: 72 (!) 59  Resp: 15 16  Temp: 99.3 F (37.4 C)   SpO2: 100% 100%   Physical Exam Vitals and nursing note reviewed.  Constitutional:      General: She is not in acute distress.    Appearance: She is well-developed and well-nourished.  HENT:     Head: Normocephalic and atraumatic.     Right Ear: External ear normal.     Left Ear: External ear normal.     Nose: Nose normal.  Eyes:     Conjunctiva/sclera: Conjunctivae normal.  Cardiovascular:     Rate and Rhythm: Normal rate and regular rhythm.     Heart sounds: No murmur heard.   Pulmonary:     Effort: Pulmonary effort is normal. No respiratory distress.     Breath sounds: Normal breath sounds.  Abdominal:     Palpations: Abdomen is soft.     Tenderness: There is no abdominal tenderness.  Musculoskeletal:        General: No edema.     Cervical back: Neck supple.  Skin:    General: Skin is warm and dry.     Capillary Refill: Capillary refill takes less than 2 seconds.  Neurological:     Mental Status: She is alert and oriented to person, place, and time.  Psychiatric:        Mood and Affect: Mood and affect and mood  normal.     Cranial nerves II through XII grossly intact.  No pronator drift.  No clear dysmetria.  Patient is full symmetric strength of her bilateral extremities.  There is no tenderness over the C or spine but there is some mild tenderness over the T and L-spine no overlying skin changes including erythema, streaking, warmth or fluctuance.  Patient was observed ambulate with steady gait unassisted. ____________________________________________   LABS (all labs ordered are listed, but only abnormal results are displayed)  Labs Reviewed  URINALYSIS, COMPLETE (UACMP) WITH MICROSCOPIC - Abnormal; Notable for the following components:      Result Value   Color, Urine YELLOW (*)    APPearance HAZY (*)    Protein, ur >=300 (*)    Bacteria, UA RARE (*)    All other components within normal limits  CBC WITH DIFFERENTIAL/PLATELET - Abnormal; Notable for the following components:   RBC 3.54 (*)    Hemoglobin 9.9 (*)    HCT 31.6 (*)    All other components within normal limits  RESP PANEL BY RT-PCR (FLU A&B, COVID) ARPGX2  BRAIN NATRIURETIC PEPTIDE  COMPREHENSIVE METABOLIC PANEL  BASIC METABOLIC PANEL  TROPONIN I (HIGH SENSITIVITY)   ____________________________________________  EKG  Sinus rhythm with a ventricular rate of 60, normal axis, unremarkable intervals, nonspecific ST change in lead III without any other clear evidence of acute ischemia or underlying arrhythmia. ____________________________________________  RADIOLOGY  ED MD interpretation: Chest x-ray has no evidence of pneumonia, overt edema, effusion, pneumothorax or other acute thoracic process.  No acute fracture dislocation on the T or L-spine plain films.  Official radiology report(s): DG Chest 2 View  Result Date: 07/16/2020 CLINICAL DATA:  Dizziness EXAM: CHEST - 2 VIEW COMPARISON:  02/28/2020 FINDINGS: The heart size and mediastinal contours are within normal limits. Both lungs are clear. The visualized skeletal  structures are unremarkable. IMPRESSION: No active cardiopulmonary disease. Electronically Signed   By: Madie Reno.D.  On: 07/16/2020 19:58   DG Thoracic Spine 2 View  Result Date: 07/16/2020 CLINICAL DATA:  Fall EXAM: THORACIC SPINE 2 VIEWS COMPARISON:  None. FINDINGS: There is no evidence of thoracic spine fracture. Alignment is normal. No other significant bone abnormalities are identified. IMPRESSION: Negative. Electronically Signed   By: Donavan Foil M.D.   On: 07/16/2020 19:56   DG Lumbar Spine 2-3 Views  Result Date: 07/16/2020 CLINICAL DATA:  Fall with back pain EXAM: LUMBAR SPINE - 2-3 VIEW COMPARISON:  CT 02/28/2020 FINDINGS: Suspected transitional anatomy. For the purposes of reporting, first non rib-bearing lumbar type vertebra will be designated L1 and transitional vertebra will be sacralized L5. Lumbar alignment within normal limits. Vertebral body heights are maintained. There are mild degenerative changes. IMPRESSION: Mild degenerative changes. No acute osseous abnormality. Electronically Signed   By: Donavan Foil M.D.   On: 07/16/2020 19:58    ____________________________________________   PROCEDURES  Procedure(s) performed (including Critical Care):  Procedures   ____________________________________________   INITIAL IMPRESSION / ASSESSMENT AND PLAN / ED COURSE        Patient presents with above history exam for assessment of multiple complaints including fall couple weeks ago, generalized back pain that has been present for at least a couple of weeks, urinary frequency and burning as well as chronic cough.  Patient also states she has been dizzy for several weeks but is unable to further elaborate.  On exam she is afebrile and hemodynamically stable.  She has a nonfocal neuro exam and there are no obvious findings of trauma aside from some mild tenderness in the L-spine.  With regard to patient's dizziness there are no neuro deficits or evidence of ataxia  to suggest CVA.  ECG shows no evidence of arrhythmia.  Patient does not appear dehydrated.  Troponin is not consistent with ACS or myocarditis.  We will plan to obtain BMP to assess for any significant metabolic or electrolyte derangements.  She is also not acutely anemic although does have anemia going back to at least last month.  Advised patient of this and advised her that she should likely be on iron supplement..  She does not appear volume overloaded chest x-ray have no evidence of pulmonary edema.  Unclear etiology at this time although given she states it is been going on for several weeks embolus patient for immediate life-threatening pathology and believe she is safe for further work-up with her PCP.   In addition with regard to her back pain no findings to suggest cellulitis or acute weakness urinary tension to suggest spinal cord compression discitis or transverse myelitis.  UA is not suggestive of cystitis and there is no CVA tenderness fever or elevated white blood cell count to suggest pyelonephritis.  It is possible she having some musculoskeletal pain versus contusion although given she fell sometime ago have a lower suspicion for trauma at this time.  However given reassuring exam and work-up with otherwise no evidence of fracture on plain films I believe she is safe for further work-up from this perspective as well with her PCP.  Care patient signed over to Dr. Sela Hilding at approximately 9:15 PM.  Plan to follow-up BMP and if it is unremarkable patient will be safe for discharge with plan for close outpatient PCP follow-up.  ___________________________________________   FINAL CLINICAL IMPRESSION(S) / ED DIAGNOSES  Final diagnoses:  Chronic bilateral low back pain, unspecified whether sciatica present  Anxiety  Dizziness  Dysuria    Medications  lidocaine (LIDODERM) 5 %  1 patch (1 patch Transdermal Patch Applied 07/16/20 2054)  acetaminophen (TYLENOL) tablet 1,000 mg (1,000 mg Oral  Given 07/16/20 2053)     ED Discharge Orders    None       Note:  This document was prepared using Dragon voice recognition software and may include unintentional dictation errors.   Lucrezia Starch, MD 07/16/20 2121

## 2020-07-21 ENCOUNTER — Other Ambulatory Visit: Payer: Self-pay

## 2020-07-21 ENCOUNTER — Emergency Department
Admission: EM | Admit: 2020-07-21 | Discharge: 2020-07-21 | Disposition: A | Discharge status: Home / Self Care | Payer: Medicare HMO | Attending: Emergency Medicine | Admitting: Emergency Medicine

## 2020-07-21 ENCOUNTER — Encounter: Payer: Self-pay | Admitting: Emergency Medicine

## 2020-07-21 DIAGNOSIS — E1122 Type 2 diabetes mellitus with diabetic chronic kidney disease: Secondary | ICD-10-CM | POA: Insufficient documentation

## 2020-07-21 DIAGNOSIS — Z7982 Long term (current) use of aspirin: Secondary | ICD-10-CM | POA: Insufficient documentation

## 2020-07-21 DIAGNOSIS — G8929 Other chronic pain: Secondary | ICD-10-CM | POA: Insufficient documentation

## 2020-07-21 DIAGNOSIS — Z7984 Long term (current) use of oral hypoglycemic drugs: Secondary | ICD-10-CM | POA: Diagnosis not present

## 2020-07-21 DIAGNOSIS — I129 Hypertensive chronic kidney disease with stage 1 through stage 4 chronic kidney disease, or unspecified chronic kidney disease: Secondary | ICD-10-CM | POA: Insufficient documentation

## 2020-07-21 DIAGNOSIS — F17210 Nicotine dependence, cigarettes, uncomplicated: Secondary | ICD-10-CM | POA: Insufficient documentation

## 2020-07-21 DIAGNOSIS — N1831 Chronic kidney disease, stage 3a: Secondary | ICD-10-CM | POA: Insufficient documentation

## 2020-07-21 DIAGNOSIS — M545 Low back pain, unspecified: Secondary | ICD-10-CM | POA: Insufficient documentation

## 2020-07-21 DIAGNOSIS — Z79899 Other long term (current) drug therapy: Secondary | ICD-10-CM | POA: Diagnosis not present

## 2020-07-21 LAB — URINALYSIS, COMPLETE (UACMP) WITH MICROSCOPIC
Bilirubin Urine: NEGATIVE
Glucose, UA: NEGATIVE mg/dL
Hgb urine dipstick: NEGATIVE
Ketones, ur: NEGATIVE mg/dL
Leukocytes,Ua: NEGATIVE
Nitrite: NEGATIVE
Protein, ur: 100 mg/dL — AB
Specific Gravity, Urine: 1.014 (ref 1.005–1.030)
pH: 6 (ref 5.0–8.0)

## 2020-07-21 MED ORDER — LIDOCAINE 5 % EX PTCH: 1.0000 | MEDICATED_PATCH | Freq: Two times a day (BID) | CUTANEOUS | 0 refills | Status: AC

## 2020-07-21 NOTE — ED Triage Notes (Signed)
Pt to ER with c/o low back pain.  States this is the same pain she was seen for last week, but it is worse.  Pt denies new pain, new injuries or new symptoms.

## 2020-07-22 NOTE — ED Provider Notes (Signed)
[facility] Emergency Department Provider Note  ____________________________________________   Event Date/Time   First MD Initiated Contact with Patient 07/21/20 1737     (approximate)  I have reviewed the triage vital signs and the nursing notes.   HISTORY  Chief Complaint Back Pain  HPI Alicia Rogers is a 59 y.o. female who reports to the emergency department for evaluation of back pain.  She states that she was evaluated for this a few days ago but that her pain has persisted.  She reports only intermittent use of her prescribed lidocaine patches, muscle relaxer.  She also reports to me a history of schizophrenia, currently denies any SI, HI.  Reports that she does have visual and auditory hallucinations but that these are stable for her.  She reports her back pain a 10/10 located in the mid lumbar region with radiation to each side.  She does not have any radiation down either leg, and the pain does stay local in the back region.  She denies any urinary frequency, dysuria, nausea vomiting or abdominal pain today.  She has not had any fever.  She denies any loss of bowel or bladder control or saddle paresthesias.         Past Medical History:  Diagnosis Date  . Depression   . Diabetes mellitus without complication ([place])   . Diabetes mellitus, type II ([place])   . Hypertension   . Schizophrenia [facility])     Patient Active Problem List   Diagnosis Date Noted  . Hypertensive urgency 02/28/2020  . Back pain 02/28/2020  . CKD (chronic kidney disease), stage IIIa 02/28/2020  . Hypokalemia 02/28/2020  . Elevated troponin 02/28/2020  . Hematuria 02/28/2020  . Tobacco abuse 02/28/2020  . Dizziness 02/28/2020  . HLD (hyperlipidemia) 02/28/2020  . Type II diabetes mellitus with renal manifestations ([place]) 02/28/2020  . Compulsive tobacco user syndrome 10/11/2014  . BP (high blood pressure) 09/04/2014  . Combined fat and carbohydrate induced hyperlipemia  07/04/2014  . Claudication ([place]) 06/09/2014  . Schizoaffective disorder, depressive type ([place]) 06/09/2014  . Diabetes mellitus, type 2 ([place]) 06/09/2014  . Type 2 diabetes mellitus ([place]) 06/09/2014  . Controlled type 2 diabetes mellitus without complication ([place]) 16/57/9038    History reviewed. No pertinent surgical history.  Prior to Admission medications   Medication Sig Start Date End Date Taking? Authorizing Provider  ACCU-CHEK FASTCLIX LANCETS [place]  02/17/15   [provider]  ACCU-CHEK SMARTVIEW test strip  04/02/15   [provider]  amLODipine (NORVASC) 10 MG tablet Take 1 tablet (10 mg total) by mouth daily. 07/02/20   Carrie Mew, MD  aspirin EC 81 MG EC tablet Take 1 tablet (81 mg total) by mouth daily. Swallow whole. 03/01/20   Ezekiel Slocumb, DO  atorvastatin (LIPITOR) 80 MG tablet Take 0.5 tablets (40 mg total) by mouth daily at 6 PM. 07/02/20   Carrie Mew, MD  Blood Glucose Monitoring Suppl (FIFTY50 GLUCOSE METER 2.0) W/DEVICE KIT Check blood sugar 3 times a day. Dx E11.9 07/04/14   [provider]  hydrochlorothiazide (HYDRODIURIL) 12.5 MG tablet Take 12.5 mg by mouth daily. 12/06/19   [provider]  hydrOXYzine (ATARAX/VISTARIL) 25 MG tablet Take 1 tablet (25 mg total) by mouth 3 (three) times daily as needed for anxiety. 07/02/20   Carrie Mew, MD  Lancet Devices (RELION LANCING DEVICE) [place]  05/09/14   [provider]  lidocaine (LIDODERM) 5 % Place 1 patch onto the skin every  12 (twelve) hours for 5 days. Remove & Discard patch within 12 hours or as directed by MD 07/21/20 07/26/20  Marlana Salvage, PA  losartan (COZAAR) 100 MG tablet Take 1 tablet (100 mg total) by mouth daily. 07/02/20   Carrie Mew, MD  lurasidone (LATUDA) 40 MG TABS tablet Take 1 tablet (40 mg total) by mouth daily with breakfast. 07/02/20   Carrie Mew, MD  metFORMIN (GLUCOPHAGE XR) 500 MG 24 hr tablet Take 2 tablets (1,000 mg  total) by mouth daily with breakfast. 07/02/20 07/02/21  Carrie Mew, MD  methocarbamol (ROBAXIN) 500 MG tablet Take 1 tablet (500 mg total) by mouth every 8 (eight) hours as needed for muscle spasms. 07/16/20   Menshew, Dannielle Karvonen, PA-C  omeprazole (PRILOSEC) 20 MG capsule Take 1 capsule (20 mg total) by mouth daily. 07/02/20 07/02/21  Carrie Mew, MD  oxybutynin (DITROPAN XL) 15 MG 24 hr tablet Take 1 tablet (15 mg total) by mouth at bedtime. 07/02/20   Carrie Mew, MD  sitaGLIPtin (JANUVIA) 100 MG tablet Take 1 tablet (100 mg total) by mouth daily. 07/02/20   Carrie Mew, MD  traZODone (DESYREL) 50 MG tablet Take 1 tablet (50 mg total) by mouth at bedtime. 07/02/20   Carrie Mew, MD    Allergies Penicillins  Family History  Problem Relation Age of Onset  . Diabetes Mother   . Glaucoma Mother   . Heart disease Mother   . Heart attack Mother   . Depression Mother   . Hypertension Mother   . Diabetes Father   . Glaucoma Father   . Hypertension Father   . Diabetes Sister   . Depression Sister   . Post-traumatic stress disorder Sister     Social History Social History   Tobacco Use  . Smoking status: Current Every Day Smoker    Packs/day: 1.00    Types: Cigarettes    Start date: 04/21/1978  . Smokeless tobacco: Never Used  Vaping Use  . Vaping Use: Never used  Substance Use Topics  . Alcohol use: No    Alcohol/week: 0.0 standard drinks  . Drug use: No    Review of Systems Constitutional: No fever/chills Eyes: No visual changes. ENT: No sore throat. Cardiovascular: Denies chest pain. Respiratory: Denies shortness of breath. Gastrointestinal: No abdominal pain.  No nausea, no vomiting.  No diarrhea.  No constipation. Genitourinary: Negative for dysuria. Musculoskeletal: + back pain. Skin: Negative for rash. Neurological: Negative for headaches, focal weakness or numbness.  ____________________________________________   PHYSICAL  EXAM:  VITAL SIGNS: ED Triage Vitals  Enc Vitals Group     BP 07/21/20 1622 (!) 158/67     Pulse Rate 07/21/20 1622 74     Resp --      Temp 07/21/20 1622 97.6 F (36.4 C)     Temp Source 07/21/20 1622 Oral     SpO2 07/21/20 1622 100 %     Weight 07/21/20 1623 106 lb (48.1 kg)     Height 07/21/20 1623 _0  (1.6 m)     Head Circumference --      Peak Flow --      Pain Score 07/21/20 1649 10     Pain Loc --      Pain Edu? --      Excl. in [place]? --     Constitutional: Alert and oriented. Well appearing and in no acute distress. Eyes: Conjunctivae are normal.  EOMI. Head: Atraumatic. Nose: No congestion/rhinnorhea. Mouth/Throat: Mucous membranes are moist.  Neck: No stridor.   Cardiovascular: Normal rate, regular rhythm. Grossly normal heart sounds.  Good peripheral circulation. Respiratory: Normal respiratory effort.  No retractions. Lungs CTAB. Gastrointestinal: Soft and nontender. No distention. No abdominal bruits. No CVA tenderness. Musculoskeletal: There is tenderness to the upper lumbar midline and paraspinal region.  No step-off deformities.  Patient has 5/5 strength bilaterally in the lower extremities in ankle plantarflexion, dorsiflexion, knee flexion, knee extension and hip flexion. Neurologic:  Normal speech and language. No gross focal neurologic deficits are appreciated. No gait instability. Skin:  Skin is warm, dry and intact. No rash noted. Psychiatric: Mood and affect are normal. Speech and behavior are normal.  ____________________________________________   LABS (all labs ordered are listed, but only abnormal results are displayed)  Labs Reviewed  URINALYSIS, COMPLETE (UACMP) WITH MICROSCOPIC - Abnormal; Notable for the following components:      Result Value   Color, Urine YELLOW (*)    APPearance HAZY (*)    Protein, ur 100 (*)    Bacteria, UA FEW (*)    All other components within normal limits    ____________________________________________   INITIAL IMPRESSION / ASSESSMENT AND PLAN / ED COURSE  As part of my medical decision making, I reviewed the following data within the [facility] notes reviewed and incorporated, Labs reviewed and Notes from prior ED visits        Patient is a 59 year old female presents to emergency department for evaluation of low back pain.  Patient was seen and evaluated in our ER on 07/16/2020 with some similar symptoms, though the patient had other associated symptoms at that time.  She denies any new trauma or change in the symptoms, to states that it is a little bit worse and has not improved.  She still  denies any red flag symptoms including fever, loss of bowel or bladder control, paresthesias down the legs.  Physical exam reveals tenderness in the upper lumbar spine and paraspinals without any radiation.  No focal neuro deficits identified.  Given the location of her pain, repeat urinalysis was performed, which is negative for any obvious infectious cause of her pain.  She reports not being completely compliant with her prescribed therapy of lidocaine patches, muscle relaxant.  Encouraged the use of these as well as instructed on appropriate Tylenol dosing.  Patient is amenable with this plan and is instructed to return if she has any worsening of symptoms.  Patient stable at time for outpatient therapy.      ____________________________________________   FINAL CLINICAL IMPRESSION(S) / ED DIAGNOSES  Final diagnoses:  Chronic midline low back pain without sciatica     ED Discharge Orders         Ordered    lidocaine (LIDODERM) 5 %  Every 12 hours        07/21/20 1912          *Please note:  Meli S Aughenbaugh was evaluated in Emergency Department on 07/22/2020 for the symptoms described in the history of present illness. She was evaluated in the context of the global COVID-19 pandemic, which necessitated  consideration that the patient might be at risk for infection with the SARS-CoV-2 virus that causes COVID-19. Institutional protocols and algorithms that pertain to the evaluation of patients at risk for COVID-19 are in a state of rapid change based on information released by regulatory bodies including the CDC and federal and state organizations. These policies and algorithms were followed during the patient's care in the  ED.  Some ED evaluations and interventions may be delayed as a result of limited staffing during and the pandemic.*   Note:  This document was prepared using Dragon voice recognition software and may include unintentional dictation errors.    Marlana Salvage, PA 07/22/20 1924    Delman Kitten, MD 07/22/20 2256

## 2020-08-27 ENCOUNTER — Emergency Department
Admission: EM | Admit: 2020-08-27 | Discharge: 2020-08-28 | Disposition: A | Discharge status: Home / Self Care | Payer: Medicare HMO | Attending: Emergency Medicine | Admitting: Emergency Medicine

## 2020-08-27 ENCOUNTER — Encounter: Payer: Self-pay | Admitting: Emergency Medicine

## 2020-08-27 ENCOUNTER — Emergency Department: Payer: Medicare HMO

## 2020-08-27 ENCOUNTER — Other Ambulatory Visit: Payer: Self-pay

## 2020-08-27 DIAGNOSIS — N1831 Chronic kidney disease, stage 3a: Secondary | ICD-10-CM | POA: Diagnosis not present

## 2020-08-27 DIAGNOSIS — R531 Weakness: Secondary | ICD-10-CM

## 2020-08-27 DIAGNOSIS — I129 Hypertensive chronic kidney disease with stage 1 through stage 4 chronic kidney disease, or unspecified chronic kidney disease: Secondary | ICD-10-CM | POA: Insufficient documentation

## 2020-08-27 DIAGNOSIS — Z7984 Long term (current) use of oral hypoglycemic drugs: Secondary | ICD-10-CM | POA: Insufficient documentation

## 2020-08-27 DIAGNOSIS — Z7982 Long term (current) use of aspirin: Secondary | ICD-10-CM | POA: Insufficient documentation

## 2020-08-27 DIAGNOSIS — U071 COVID-19: Secondary | ICD-10-CM

## 2020-08-27 DIAGNOSIS — Z79899 Other long term (current) drug therapy: Secondary | ICD-10-CM | POA: Diagnosis not present

## 2020-08-27 DIAGNOSIS — E860 Dehydration: Secondary | ICD-10-CM | POA: Insufficient documentation

## 2020-08-27 DIAGNOSIS — F17210 Nicotine dependence, cigarettes, uncomplicated: Secondary | ICD-10-CM | POA: Insufficient documentation

## 2020-08-27 DIAGNOSIS — E1122 Type 2 diabetes mellitus with diabetic chronic kidney disease: Secondary | ICD-10-CM | POA: Insufficient documentation

## 2020-08-27 LAB — CBC
HCT: 30 % — ABNORMAL LOW (ref 36.0–46.0)
Hemoglobin: 9.5 g/dL — ABNORMAL LOW (ref 12.0–15.0)
MCH: 28.1 pg (ref 26.0–34.0)
MCHC: 31.7 g/dL (ref 30.0–36.0)
MCV: 88.8 fL (ref 80.0–100.0)
Platelets: 277 10*3/uL (ref 150–400)
RBC: 3.38 MIL/uL — ABNORMAL LOW (ref 3.87–5.11)
RDW: 14.4 % (ref 11.5–15.5)
WBC: 11.9 10*3/uL — ABNORMAL HIGH (ref 4.0–10.5)
nRBC: 0 % (ref 0.0–0.2)

## 2020-08-27 LAB — BASIC METABOLIC PANEL
Anion gap: 10 (ref 5–15)
BUN: 21 mg/dL — ABNORMAL HIGH (ref 6–20)
CO2: 25 mmol/L (ref 22–32)
Calcium: 8.7 mg/dL — ABNORMAL LOW (ref 8.9–10.3)
Chloride: 103 mmol/L (ref 98–111)
Creatinine, Ser: 1.34 mg/dL — ABNORMAL HIGH (ref 0.44–1.00)
GFR, Estimated: 45 mL/min — ABNORMAL LOW (ref >60)
Glucose, Bld: 178 mg/dL — ABNORMAL HIGH (ref 70–99)
Potassium: 3.9 mmol/L (ref 3.5–5.1)
Sodium: 138 mmol/L (ref 135–145)

## 2020-08-27 LAB — CBG MONITORING, ED: Glucose-Capillary: 177 mg/dL — ABNORMAL HIGH (ref 70–99)

## 2020-08-27 LAB — POC SARS CORONAVIRUS 2 AG -  ED: SARS Coronavirus 2 Ag: POSITIVE — AB

## 2020-08-27 LAB — TROPONIN I (HIGH SENSITIVITY): Troponin I (High Sensitivity): 24 ng/L — ABNORMAL HIGH (ref <18)

## 2020-08-27 MED ORDER — AMLODIPINE BESYLATE 5 MG PO TABS
5.0000 mg | ORAL_TABLET | Freq: Every day | ORAL | Status: DC
  Administered 2020-08-27: 5 mg via ORAL
  Filled 2020-08-27: qty 1

## 2020-08-27 MED ORDER — ONDANSETRON 4 MG PO TBDP: 4.0000 mg | ORAL_TABLET | Freq: Four times a day (QID) | ORAL | 0 refills | Status: DC | PRN

## 2020-08-27 MED ORDER — HYDROXYZINE HCL 25 MG PO TABS
25.0000 mg | ORAL_TABLET | Freq: Three times a day (TID) | ORAL | Status: DC | PRN
  Administered 2020-08-27: 25 mg via ORAL
  Filled 2020-08-27: qty 1

## 2020-08-27 MED ORDER — SODIUM CHLORIDE 0.9 % IV BOLUS
1000.0000 mL | Freq: Once | INTRAVENOUS | Status: AC
  Administered 2020-08-27: 1000 mL via INTRAVENOUS

## 2020-08-27 MED ORDER — ASPIRIN EC 81 MG PO TBEC
81.0000 mg | DELAYED_RELEASE_TABLET | Freq: Every day | ORAL | Status: DC
Start: 2020-08-27 — End: 2020-08-28
  Administered 2020-08-27: 81 mg via ORAL
  Filled 2020-08-27: qty 1

## 2020-08-27 NOTE — Discharge Instructions (Signed)
Please follow-up closely with your doctor.  We have also given you a referral to our Covid clinic, they will reach out to you if you qualify for specific treatments through the clinic.  It is very important that you come back if you start to develop difficulty breathing, feel dehydrated, weak or as though your symptoms are worsening.  You are free to return to the ER at any time if you have new concerns.

## 2020-08-27 NOTE — ED Provider Notes (Addendum)
[facility] Emergency Department Provider Note   ____________________________________________   Event Date/Time   First MD Initiated Contact with Patient 08/27/20 2037     (approximate)  I have reviewed the triage vital signs and the nursing notes.   HISTORY  Chief Complaint Weakness    HPI Alicia Rogers is a 60 y.o. female history of hypertension schizophrenia diabetes  Patient comes in today reports that she was attending her mother's funeral today when she started feeling very weak and fatigued.  She just felt progressively more weak throughout the course of the morning prompting her to come to the ER for evaluation.  Additionally she reports that she is felt a bit tremulous in her hands since yesterday.  She has not taken her diabetes medication yet today.  She is not noticed any fevers or chills.  She did notice earlier today around the time of the funeral a little bit of chest pain that she describes primarily as a sharp feeling and its been coming and going throughout the day  No radiating pain.  No nausea or vomiting but decreased appetite today.  No abdominal pain.  No diarrhea.  Feels fatigued but is overall starting to feel better having waited in the ER for some time   Past Medical History:  Diagnosis Date  . Depression   . Diabetes mellitus without complication ([place])   . Diabetes mellitus, type II ([place])   . Hypertension   . Schizophrenia [facility])     Patient Active Problem List   Diagnosis Date Noted  . Hypertensive urgency 02/28/2020  . Back pain 02/28/2020  . CKD (chronic kidney disease), stage IIIa 02/28/2020  . Hypokalemia 02/28/2020  . Elevated troponin 02/28/2020  . Hematuria 02/28/2020  . Tobacco abuse 02/28/2020  . Dizziness 02/28/2020  . HLD (hyperlipidemia) 02/28/2020  . Type II diabetes mellitus with renal manifestations ([place]) 02/28/2020  . Compulsive tobacco user syndrome 10/11/2014  . BP (high blood pressure)  09/04/2014  . 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  . Claudication ([place]) 06/09/2014  . Schizoaffective disorder, depressive type ([place]) 06/09/2014  . Diabetes mellitus, type 2 ([place]) 06/09/2014  . Type 2 diabetes mellitus ([place]) 06/09/2014  . Controlled type 2 diabetes mellitus without complication ([place]) 35/32/9924    History reviewed. No pertinent surgical history.  Prior to Admission medications   Medication Sig Start Date End Date Taking? Authorizing Provider  ondansetron (ZOFRAN ODT) 4 MG disintegrating tablet Take 1 tablet (4 mg total) by mouth every 6 (six) hours as needed for nausea or vomiting. 08/27/20  Yes Delman Kitten, MD  ACCU-CHEK FASTCLIX LANCETS [place]  02/17/15   [provider]  ACCU-CHEK SMARTVIEW test strip  04/02/15   [provider]  amLODipine (NORVASC) 10 MG tablet Take 1 tablet (10 mg total) by mouth daily. 07/02/20   Carrie Mew, MD  aspirin EC 81 MG EC tablet Take 1 tablet (81 mg total) by mouth daily. Swallow whole. 03/01/20   Ezekiel Slocumb, DO  atorvastatin (LIPITOR) 80 MG tablet Take 0.5 tablets (40 mg total) by mouth daily at 6 PM. 07/02/20   Carrie Mew, MD  Blood Glucose Monitoring Suppl (FIFTY50 GLUCOSE METER 2.0) W/DEVICE KIT Check blood sugar 3 times a day. Dx E11.9 07/04/14   [provider]  hydrochlorothiazide (HYDRODIURIL) 12.5 MG tablet Take 12.5 mg by mouth daily. 12/06/19   [provider]  hydrOXYzine (ATARAX/VISTARIL) 25 MG tablet Take 1 tablet (25 mg total) by mouth 3 (three)  times daily as needed for anxiety. 07/02/20   Carrie Mew, MD  Lancet Devices (RELION LANCING DEVICE) [place]  05/09/14   [provider]  losartan (COZAAR) 100 MG tablet Take 1 tablet (100 mg total) by mouth daily. 07/02/20   Carrie Mew, MD  lurasidone (LATUDA) 40 MG TABS tablet Take 1 tablet (40 mg total) by mouth daily with breakfast. 07/02/20   Carrie Mew, MD  metFORMIN (GLUCOPHAGE XR) 500  MG 24 hr tablet Take 2 tablets (1,000 mg total) by mouth daily with breakfast. 07/02/20 07/02/21  Carrie Mew, MD  methocarbamol (ROBAXIN) 500 MG tablet Take 1 tablet (500 mg total) by mouth every 8 (eight) hours as needed for muscle spasms. 07/16/20   Menshew, Dannielle Karvonen, PA-C  omeprazole (PRILOSEC) 20 MG capsule Take 1 capsule (20 mg total) by mouth daily. 07/02/20 07/02/21  Carrie Mew, MD  oxybutynin (DITROPAN XL) 15 MG 24 hr tablet Take 1 tablet (15 mg total) by mouth at bedtime. 07/02/20   Carrie Mew, MD  sitaGLIPtin (JANUVIA) 100 MG tablet Take 1 tablet (100 mg total) by mouth daily. 07/02/20   Carrie Mew, MD  traZODone (DESYREL) 50 MG tablet Take 1 tablet (50 mg total) by mouth at bedtime. 07/02/20   Carrie Mew, MD    Allergies Penicillins  Family History  Problem Relation Age of Onset  . Diabetes Mother   . Glaucoma Mother   . Heart disease Mother   . Heart attack Mother   . Depression Mother   . Hypertension Mother   . Diabetes Father   . Glaucoma Father   . Hypertension Father   . Diabetes Sister   . Depression Sister   . Post-traumatic stress disorder Sister     Social History Social History   Tobacco Use  . Smoking status: Current Every Day Smoker    Packs/day: 1.00    Types: Cigarettes    Start date: 04/21/1978  . Smokeless tobacco: Never Used  Vaping Use  . Vaping Use: Never used  Substance Use Topics  . Alcohol use: No    Alcohol/week: 0.0 standard drinks  . Drug use: No    Review of Systems Constitutional: No fever/chills Eyes: No visual changes. ENT: No sore throat. Cardiovascular: See HPI Respiratory: Denies shortness of breath now, but felt a little short of breath earlier today. Gastrointestinal: No abdominal pain.  Decreased appetite. Genitourinary: Negative for dysuria. Musculoskeletal: Negative for back pain. Skin: Negative for rash. Neurological: Negative for headaches, areas of focal weakness or  numbness.    ____________________________________________   PHYSICAL EXAM:  VITAL SIGNS: ED Triage Vitals  Enc Vitals Group     BP 08/27/20 1147 (!) 137/51     Pulse Rate 08/27/20 1147 94     Resp 08/27/20 1147 16     Temp 08/27/20 1147 99.6 F (37.6 C)     Temp Source 08/27/20 1147 Oral     SpO2 08/27/20 1147 100 %     Weight 08/27/20 1148 103 lb (46.7 kg)     Height 08/27/20 1148 $RemoveBefor'5\' 3"'[id number]$  (1.6 m)     Head Circumference --      Peak Flow --      Pain Score 08/27/20 1148 0     Pain Loc --      Pain Edu? --      Excl. in [place]? --     Constitutional: Alert and oriented. Well appearing and in no acute distress.  She is very pleasant, slightly flat affect  but conversant without distress. Eyes: Conjunctivae are normal. Head: Atraumatic. Nose: No congestion/rhinnorhea. Mouth/Throat: Mucous membranes are dry. Neck: No stridor.  Cardiovascular: Normal rate, regular rhythm. Grossly normal heart sounds.  Good peripheral circulation. Respiratory: Normal respiratory effort.  No retractions. Lungs CTAB.  Speaks in full clear sentences. Gastrointestinal: Soft and nontender. No distention. Musculoskeletal: No lower extremity tenderness nor edema. Neurologic:  Normal speech and language. No gross focal neurologic deficits are appreciated.  Skin:  Skin is warm, dry and intact. No rash noted. Psychiatric: Mood and affect are slightly flat. Speech and behavior are normal.  ____________________________________________   LABS (all labs ordered are listed, but only abnormal results are displayed)  Labs Reviewed  BASIC METABOLIC PANEL - Abnormal; Notable for the following components:      Result Value   Glucose, Bld 178 (*)    BUN 21 (*)    Creatinine, Ser 1.34 (*)    Calcium 8.7 (*)    GFR, Estimated 45 (*)    All other components within normal limits  CBC - Abnormal; Notable for the following components:   WBC 11.9 (*)    RBC 3.38 (*)    Hemoglobin 9.5 (*)    HCT 30.0 (*)    All  other components within normal limits  CBG MONITORING, ED - Abnormal; Notable for the following components:   Glucose-Capillary 177 (*)    All other components within normal limits  POC SARS CORONAVIRUS 2 AG -  ED - Abnormal; Notable for the following components:   SARS Coronavirus 2 Ag Positive (*)    All other components within normal limits  TROPONIN I (HIGH SENSITIVITY) - Abnormal; Notable for the following components:   Troponin I (High Sensitivity) 24 (*)    All other components within normal limits   ____________________________________________  EKG  Reviewed entered by me at 1152 Heart rate 90 QRS 80 QTc 420 Normal sinus rhythm, biphasic appearance V3 V4 and slight inversion in V5.  Compared with previous EKG appears similar in appearance. ____________________________________________  RADIOLOGY  Chest x-ray reviewed, normal ____________________________________________   PROCEDURES  Procedure(s) performed: None  Procedures  Critical Care performed: No  ____________________________________________   INITIAL IMPRESSION / ASSESSMENT AND PLAN / ED COURSE  Pertinent labs & imaging results that were available during my care of the patient were reviewed by me and considered in my medical decision making (see chart for details).   Patient presents for generalized fatigue some slight atypical sharp chest pains earlier today.  Also feeling a bit tremulous in her hands throughout the day and last night.  She does report this to be in the context of attending her mother's funeral today.  She is also a diabetic.  Feels generally fatigued slight decreased appetite but have treatment in the ER for some time now she does report she is starting to feel better  Overall she has a reassuring exam with normal hemodynamics.  Given her associated slight sharp chest pain will check a troponin though her EKG appears to be at its baseline and symptoms atypical of ACS.  She denies associated  or significant dyspnea.  She does not appear to have obvious risk factors for acute pulmonary embolism appears to be low risk for this, her symptoms have at this point resolved, no hypoxia no tachycardia.  Additionally she reports generalized fatigue and her labs do demonstrate mild decrease in her GFR.  Will provide hydration, suspect some element of dehydration, fatigue, and etiology of her symptoms not totally  clear.  Will review chest x-ray as well as Covid test.  Clinical Course as of 08/27/20 2322  Wed Aug 27, 2020  2225 Patient's troponin is only very minimally elevated and this is in the setting of mild renal insufficiency.  She appears to have a history of minimally elevated troponins as well on historical review.  Currently chest pain-free.  Doubt ACS [MQ]    Clinical Course User Index [MQ] Delman Kitten, MD    ----------------------------------------- 10:04 PM on 08/27/2020 -----------------------------------------  Patient's Covid test has returned positive.  I think this explains her symptoms of fatigue earlier feeling of slight sharp chest pain, and generalized weakness.  She appears mildly dehydrated.  Will provide IV fluids, she is currently eating and drinking well, resting comfortably.  Discussed diagnosis of Covid and have also made ambulatory referral to the Covid follow-up clinic will screen and evaluate for possible outpatient therapies.  Discussed careful return precautions specific to COVID-19 with the patient.  She is comfortable with these careful return precautions  Return precautions and treatment recommendations and follow-up discussed with the patient who is agreeable with the plan.  Patient feels improved has been able to eat and drink well feels improved at this time.  Stable for discharge  ____________________________________________   FINAL CLINICAL IMPRESSION(S) / ED DIAGNOSES  Final diagnoses:  Generalized weakness  Dehydration  COVID-19         Note:  This document was prepared using Dragon voice recognition software and may include unintentional dictation errors       Delman Kitten, MD 08/27/20 2322    Delman Kitten, MD 08/27/20 2336

## 2020-08-27 NOTE — ED Triage Notes (Signed)
Pt to ED via [facility] with c/o weakness, per EMS pt's family had a funeral earlier today and pt began c/o weakness. Per EMS VSS, pt A&O x4 and at baseline.   CBG 260

## 2020-08-27 NOTE — ED Triage Notes (Signed)
Pt comes into the ED via [facility] from home c/o generalized weakness.  Pt states she woke up fine but has progressively feeling weaker through the day.  Pt states she did not eat breakfast this morning, but she is diabetic. Pt states her CBG at home was reading over 200 which is not normal for her.  Pt also had a funeral earlier today and that is when she started feeling weak.  Pt currently A&Ox4 and at her baseline.

## 2020-08-27 NOTE — ED Notes (Signed)
Patient transported to X-ray 

## 2020-09-22 DIAGNOSIS — L249 Irritant contact dermatitis, unspecified cause: Secondary | ICD-10-CM | POA: Insufficient documentation

## 2020-10-13 ENCOUNTER — Emergency Department
Admission: EM | Admit: 2020-10-13 | Discharge: 2020-10-14 | Disposition: A | Discharge status: Other Acute Inpatient Hospital | Payer: Medicare HMO | Attending: Emergency Medicine | Admitting: Emergency Medicine

## 2020-10-13 ENCOUNTER — Other Ambulatory Visit: Payer: Self-pay

## 2020-10-13 DIAGNOSIS — Z7984 Long term (current) use of oral hypoglycemic drugs: Secondary | ICD-10-CM | POA: Diagnosis not present

## 2020-10-13 DIAGNOSIS — Z7982 Long term (current) use of aspirin: Secondary | ICD-10-CM | POA: Diagnosis not present

## 2020-10-13 DIAGNOSIS — F17210 Nicotine dependence, cigarettes, uncomplicated: Secondary | ICD-10-CM | POA: Insufficient documentation

## 2020-10-13 DIAGNOSIS — R440 Auditory hallucinations: Secondary | ICD-10-CM | POA: Diagnosis present

## 2020-10-13 DIAGNOSIS — Z79899 Other long term (current) drug therapy: Secondary | ICD-10-CM | POA: Diagnosis not present

## 2020-10-13 DIAGNOSIS — I10 Essential (primary) hypertension: Secondary | ICD-10-CM | POA: Diagnosis present

## 2020-10-13 DIAGNOSIS — F251 Schizoaffective disorder, depressive type: Secondary | ICD-10-CM | POA: Diagnosis not present

## 2020-10-13 DIAGNOSIS — I129 Hypertensive chronic kidney disease with stage 1 through stage 4 chronic kidney disease, or unspecified chronic kidney disease: Secondary | ICD-10-CM | POA: Insufficient documentation

## 2020-10-13 DIAGNOSIS — N184 Chronic kidney disease, stage 4 (severe): Secondary | ICD-10-CM | POA: Diagnosis present

## 2020-10-13 DIAGNOSIS — N1831 Chronic kidney disease, stage 3a: Secondary | ICD-10-CM | POA: Diagnosis not present

## 2020-10-13 DIAGNOSIS — Z20822 Contact with and (suspected) exposure to covid-19: Secondary | ICD-10-CM | POA: Insufficient documentation

## 2020-10-13 DIAGNOSIS — E1122 Type 2 diabetes mellitus with diabetic chronic kidney disease: Secondary | ICD-10-CM | POA: Insufficient documentation

## 2020-10-13 DIAGNOSIS — E119 Type 2 diabetes mellitus without complications: Secondary | ICD-10-CM

## 2020-10-13 DIAGNOSIS — N183 Chronic kidney disease, stage 3 unspecified: Secondary | ICD-10-CM | POA: Diagnosis present

## 2020-10-13 LAB — RESP PANEL BY RT-PCR (FLU A&B, COVID) ARPGX2
Influenza A by PCR: NEGATIVE
Influenza B by PCR: NEGATIVE
SARS Coronavirus 2 by RT PCR: NEGATIVE

## 2020-10-13 LAB — COMPREHENSIVE METABOLIC PANEL
ALT: 14 U/L (ref 0–44)
AST: 21 U/L (ref 15–41)
Albumin: 2.9 g/dL — ABNORMAL LOW (ref 3.5–5.0)
Alkaline Phosphatase: 79 U/L (ref 38–126)
Anion gap: 8 (ref 5–15)
BUN: 19 mg/dL (ref 6–20)
CO2: 21 mmol/L — ABNORMAL LOW (ref 22–32)
Calcium: 8.8 mg/dL — ABNORMAL LOW (ref 8.9–10.3)
Chloride: 109 mmol/L (ref 98–111)
Creatinine, Ser: 1.21 mg/dL — ABNORMAL HIGH (ref 0.44–1.00)
GFR, Estimated: 51 mL/min — ABNORMAL LOW (ref >60)
Glucose, Bld: 274 mg/dL — ABNORMAL HIGH (ref 70–99)
Potassium: 3.7 mmol/L (ref 3.5–5.1)
Sodium: 138 mmol/L (ref 135–145)
Total Bilirubin: 0.6 mg/dL (ref 0.3–1.2)
Total Protein: 7.2 g/dL (ref 6.5–8.1)

## 2020-10-13 LAB — CBC
HCT: 30.4 % — ABNORMAL LOW (ref 36.0–46.0)
Hemoglobin: 9.7 g/dL — ABNORMAL LOW (ref 12.0–15.0)
MCH: 28.3 pg (ref 26.0–34.0)
MCHC: 31.9 g/dL (ref 30.0–36.0)
MCV: 88.6 fL (ref 80.0–100.0)
Platelets: 384 10*3/uL (ref 150–400)
RBC: 3.43 MIL/uL — ABNORMAL LOW (ref 3.87–5.11)
RDW: 14.6 % (ref 11.5–15.5)
WBC: 11.6 10*3/uL — ABNORMAL HIGH (ref 4.0–10.5)
nRBC: 0 % (ref 0.0–0.2)

## 2020-10-13 LAB — LIPID PANEL
Cholesterol: 267 mg/dL — ABNORMAL HIGH (ref 0–200)
HDL: 86 mg/dL (ref >40)
LDL Cholesterol: 145 mg/dL — ABNORMAL HIGH (ref 0–99)
Total CHOL/HDL Ratio: 3.1 RATIO
Triglycerides: 180 mg/dL — ABNORMAL HIGH (ref <150)
VLDL: 36 mg/dL (ref 0–40)

## 2020-10-13 LAB — TSH: TSH: 1.393 u[IU]/mL (ref 0.350–4.500)

## 2020-10-13 LAB — ACETAMINOPHEN LEVEL: Acetaminophen (Tylenol), Serum: <10 ug/mL — ABNORMAL LOW (ref 10–30)

## 2020-10-13 LAB — ETHANOL: Alcohol, Ethyl (B): <10 mg/dL (ref <10)

## 2020-10-13 LAB — SALICYLATE LEVEL: Salicylate Lvl: <7 mg/dL — ABNORMAL LOW (ref 7.0–30.0)

## 2020-10-13 MED ORDER — ATORVASTATIN CALCIUM 20 MG PO TABS
80.0000 mg | ORAL_TABLET | Freq: Every day | ORAL | Status: DC
  Administered 2020-10-13: 80 mg via ORAL
  Filled 2020-10-13: qty 4

## 2020-10-13 MED ORDER — CLONIDINE HCL 0.1 MG PO TABS
0.1000 mg | ORAL_TABLET | Freq: Once | ORAL | Status: AC
  Administered 2020-10-13: 0.1 mg via ORAL
  Filled 2020-10-13: qty 1

## 2020-10-13 MED ORDER — ASPIRIN 81 MG PO CHEW
81.0000 mg | CHEWABLE_TABLET | Freq: Every day | ORAL | Status: DC
Start: 2020-10-13 — End: 2020-10-14
  Administered 2020-10-13 – 2020-10-14 (×2): 81 mg via ORAL
  Filled 2020-10-13 (×2): qty 1

## 2020-10-13 MED ORDER — AMLODIPINE BESYLATE 5 MG PO TABS
10.0000 mg | ORAL_TABLET | Freq: Every day | ORAL | Status: DC
  Administered 2020-10-13 – 2020-10-14 (×2): 10 mg via ORAL
  Filled 2020-10-13 (×2): qty 2

## 2020-10-13 MED ORDER — TRAZODONE HCL 100 MG PO TABS
100.0000 mg | ORAL_TABLET | Freq: Every day | ORAL | Status: DC
  Administered 2020-10-13: 100 mg via ORAL
  Filled 2020-10-13: qty 1

## 2020-10-13 MED ORDER — LURASIDONE HCL 40 MG PO TABS
40.0000 mg | ORAL_TABLET | Freq: Every day | ORAL | Status: DC
  Administered 2020-10-14: 40 mg via ORAL
  Filled 2020-10-13 (×2): qty 1

## 2020-10-13 MED ORDER — AMLODIPINE BESYLATE 5 MG PO TABS
10.0000 mg | ORAL_TABLET | Freq: Once | ORAL | Status: AC
  Administered 2020-10-13: 10 mg via ORAL
  Filled 2020-10-13: qty 2

## 2020-10-13 MED ORDER — METFORMIN HCL ER 500 MG PO TB24
1000.0000 mg | ORAL_TABLET | Freq: Every day | ORAL | Status: DC
  Administered 2020-10-14: 1000 mg via ORAL
  Filled 2020-10-13 (×2): qty 2

## 2020-10-13 MED ORDER — PANTOPRAZOLE SODIUM 40 MG PO TBEC
40.0000 mg | DELAYED_RELEASE_TABLET | Freq: Every day | ORAL | Status: DC
  Administered 2020-10-14: 40 mg via ORAL

## 2020-10-13 MED ORDER — CLONIDINE HCL 0.1 MG PO TABS: 0.1000 mg | ORAL_TABLET | Freq: Once | ORAL | Status: DC

## 2020-10-13 MED ORDER — LOSARTAN POTASSIUM 50 MG PO TABS
100.0000 mg | ORAL_TABLET | Freq: Every day | ORAL | Status: DC
  Administered 2020-10-13 – 2020-10-14 (×2): 100 mg via ORAL
  Filled 2020-10-13 (×2): qty 2

## 2020-10-13 NOTE — ED Triage Notes (Signed)
Pt comes with c/o no taking her psych meds and eating. Pt states she has schizophrenia. Pt states she is hearing voices. Pt states this started 3 months ago.  Pt denies any SI or HI. Pt denies any drug or alcohol use.  Pt is calm and cooperative and very soft spoken.

## 2020-10-13 NOTE — ED Notes (Signed)
Report received from Amy, RN including Situation, Background, Assessment, and Recommendations. Patient alert and oriented, warm and dry, and in no acute distress. Patient denies SI, HI, VH and pain, reports AH. Patient made aware of Q15 minute rounds and Engineer, drilling presence for their safety. Patient instructed to come to this nurse with needs or concerns.

## 2020-10-13 NOTE — ED Notes (Signed)
Hourly rounding completed at this time, patient currently awake in hallway bed. No complaints, stable, and in no acute distress. Q15 minute rounds and monitoring via Engineer, drilling to continue.

## 2020-10-13 NOTE — ED Notes (Signed)
See order for intervention for BP. To be administered with ordered losartan.

## 2020-10-13 NOTE — ED Notes (Signed)
Dr clapacs with pt  Pt in hallway bed.

## 2020-10-13 NOTE — ED Notes (Signed)
Pt walks in hallway on own, while trying to get to off of bed pt was unable to hold urine and urinates on self. Cleaned at this time and allowed to change clothes and linens were changed.

## 2020-10-13 NOTE — Consult Note (Signed)
[facility] Face-to-Face Psychiatry Consult   Reason for Consult: Consult for 60 year old woman with a history of schizoaffective disorder comes voluntarily to the emergency room Referring Physician: Synechiae Patient Identification: Alicia Rogers MRN:  [id number] Principal Diagnosis: Schizoaffective disorder, depressive type ([place]) Diagnosis:  Principal Problem:   Schizoaffective disorder, depressive type ([place]) Active Problems:   BP (high blood pressure)   Diabetes mellitus, type 2 ([place])   CKD (chronic kidney disease), stage IIIa   Total Time spent with patient: 1 hour  Subjective:   Alicia Rogers is a 60 y.o. female patient admitted with "my schizophrenia got bad again".  HPI: Patient seen chart reviewed.  60 year old woman with a history of schizoaffective disorder.  Patient presents voluntarily brought in by her family because of poor health and poor mental health.  Patient says she got off her medicine because she just could not swallow the pills anymore.  Still gets an [place] injection but is not clear when that happened.  She hears voices pretty much constantly.  Mood feels depressed.  Energy level low.  Sleep is poor.  Appetite very poor with continued weight loss.  No active suicidal or homicidal thought.  Patient presents as very flat and withdrawn with little eye contact.  Past Psychiatric History: Last seen in November and was sent to a geriatric psychiatry ward.  History of schizoaffective disorder depressive type.  Did well on combination of antipsychotics in the past.  Risk to Self:   Risk to Others:   Prior Inpatient Therapy:   Prior Outpatient Therapy:    Past Medical History:  Past Medical History:  Diagnosis Date  . Depression   . Diabetes mellitus without complication ([place])   . Diabetes mellitus, type II ([place])   . Hypertension   . Schizophrenia ([place])    History reviewed. No pertinent surgical history. Family History:  Family History  Problem Relation Age of  Onset  . Diabetes Mother   . Glaucoma Mother   . Heart disease Mother   . Heart attack Mother   . Depression Mother   . Hypertension Mother   . Diabetes Father   . Glaucoma Father   . Hypertension Father   . Diabetes Sister   . Depression Sister   . Post-traumatic stress disorder Sister    Family Psychiatric  History: None reported Social History:  Social History   Substance and Sexual Activity  Alcohol Use No  . Alcohol/week: 0.0 standard drinks     Social History   Substance and Sexual Activity  Drug Use No    Social History   Socioeconomic History  . Marital status: Single    Spouse name: Not on file  . Number of children: Not on file  . Years of education: Not on file  . Highest education level: Not on file  Occupational History  . Not on file  Tobacco Use  . Smoking status: Current Every Day Smoker    Packs/day: 1.00    Types: Cigarettes    Start date: 04/21/1978  . Smokeless tobacco: Never Used  Vaping Use  . Vaping Use: Never used  Substance and Sexual Activity  . Alcohol use: No    Alcohol/week: 0.0 standard drinks  . Drug use: No  . Sexual activity: Never  Other Topics Concern  . Not on file  Social History Narrative  . Not on file   Social Determinants of Health   Financial Resource Strain: Not on file  Food Insecurity: Not on file  Transportation  Needs: Not on file  Physical Activity: Not on file  Stress: Not on file  Social Connections: Not on file   Additional Social History:    Allergies:   Allergies  Allergen Reactions  . Penicillins     Rash    Labs:  Results for orders placed or performed during the hospital encounter of 10/13/20 (from the past 48 hour(s))  Comprehensive metabolic panel     Status: Abnormal   Collection Time: 10/13/20  4:11 PM  Result Value Ref Range   Sodium 138 135 - 145 mmol/L   Potassium 3.7 3.5 - 5.1 mmol/L   Chloride 109 98 - 111 mmol/L   CO2 21 (L) 22 - 32 mmol/L   Glucose, Bld 274 (H) 70 - 99  mg/dL    Comment: Glucose reference range applies only to samples taken after fasting for at least 8 hours.   BUN 19 6 - 20 mg/dL   Creatinine, Ser 1.21 (H) 0.44 - 1.00 mg/dL   Calcium 8.8 (L) 8.9 - 10.3 mg/dL   Total Protein 7.2 6.5 - 8.1 g/dL   Albumin 2.9 (L) 3.5 - 5.0 g/dL   AST 21 15 - 41 U/L   ALT 14 0 - 44 U/L   Alkaline Phosphatase 79 38 - 126 U/L   Total Bilirubin 0.6 0.3 - 1.2 mg/dL   GFR, Estimated 51 (L) >60 mL/min    Comment: (NOTE) Calculated using the CKD-EPI Creatinine Equation (2021)    Anion gap 8 5 - 15    Comment: Performed at [facility], [address] [place], [place] [address]  Ethanol     Status: None   Collection Time: 10/13/20  4:11 PM  Result Value Ref Range   Alcohol, Ethyl (B) <10 <10 mg/dL    Comment: (NOTE) Lowest detectable limit for serum alcohol is 10 mg/dL.  For medical purposes only. Performed at [facility], [address] [place], [place] [address]   Salicylate level     Status: Abnormal   Collection Time: 10/13/20  4:11 PM  Result Value Ref Range   Salicylate Lvl <6.3 (L) 7.0 - 30.0 mg/dL    Comment: Performed at [facility], [place], [place] [address]  Acetaminophen level     Status: Abnormal   Collection Time: 10/13/20  4:11 PM  Result Value Ref Range   Acetaminophen (Tylenol), Serum <10 (L) 10 - 30 ug/mL    Comment: (NOTE) Therapeutic concentrations vary significantly. A range of 10-30 ug/mL  may be an effective concentration for many patients. However, some  are best treated at concentrations outside of this range. Acetaminophen concentrations >150 ug/mL at 4 hours after ingestion  and >50 ug/mL at 12 hours after ingestion are often associated with  toxic reactions.  Performed at [facility], [address] [place], [place] [address]   cbc     Status: Abnormal   Collection Time: 10/13/20  4:11 PM  Result Value Ref Range   WBC 11.6 (H) 4.0 - 10.5 K/uL   RBC  3.43 (L) 3.87 - 5.11 MIL/uL   Hemoglobin 9.7 (L) 12.0 - 15.0 g/dL   HCT 30.4 (L) 36.0 - 46.0 %   MCV 88.6 80.0 - 100.0 fL   MCH 28.3 26.0 - 34.0 pg   MCHC 31.9 30.0 - 36.0 g/dL   RDW 14.6 11.5 - 15.5 %   Platelets 384 150 - 400 K/uL   nRBC 0.0 0.0 - 0.2 %  Comment: Performed at [facility], [address] [place], [place] [address]    No current facility-administered medications for this encounter.   Current Outpatient Medications  Medication Sig Dispense Refill  . ACCU-CHEK FASTCLIX LANCETS MISC     . ACCU-CHEK SMARTVIEW test strip     . amLODipine (NORVASC) 10 MG tablet Take 1 tablet (10 mg total) by mouth daily. 30 tablet 1  . aspirin EC 81 MG EC tablet Take 1 tablet (81 mg total) by mouth daily. Swallow whole. 30 tablet 1  . atorvastatin (LIPITOR) 80 MG tablet Take 0.5 tablets (40 mg total) by mouth daily at 6 PM. 30 tablet 1  . Blood Glucose Monitoring Suppl (FIFTY50 GLUCOSE METER 2.0) W/DEVICE KIT Check blood sugar 3 times a day. Dx E11.9    . hydrochlorothiazide (HYDRODIURIL) 12.5 MG tablet Take 12.5 mg by mouth daily.    . hydrOXYzine (ATARAX/VISTARIL) 25 MG tablet Take 1 tablet (25 mg total) by mouth 3 (three) times daily as needed for anxiety. 30 tablet 0  . Lancet Devices (RELION LANCING DEVICE) MISC     . losartan (COZAAR) 100 MG tablet Take 1 tablet (100 mg total) by mouth daily. 30 tablet 1  . lurasidone (LATUDA) 40 MG TABS tablet Take 1 tablet (40 mg total) by mouth daily with breakfast. 30 tablet 0  . metFORMIN (GLUCOPHAGE XR) 500 MG 24 hr tablet Take 2 tablets (1,000 mg total) by mouth daily with breakfast. 60 tablet 1  . methocarbamol (ROBAXIN) 500 MG tablet Take 1 tablet (500 mg total) by mouth every 8 (eight) hours as needed for muscle spasms. 20 tablet 0  . omeprazole (PRILOSEC) 20 MG capsule Take 1 capsule (20 mg total) by mouth daily. 30 capsule 1  . ondansetron (ZOFRAN ODT) 4 MG disintegrating tablet Take 1 tablet (4 mg total) by mouth every 6 (six)  hours as needed for nausea or vomiting. 20 tablet 0  . oxybutynin (DITROPAN XL) 15 MG 24 hr tablet Take 1 tablet (15 mg total) by mouth at bedtime. 30 tablet 1  . sitaGLIPtin (JANUVIA) 100 MG tablet Take 1 tablet (100 mg total) by mouth daily. 30 tablet 1  . traZODone (DESYREL) 50 MG tablet Take 1 tablet (50 mg total) by mouth at bedtime. 30 tablet 1    Musculoskeletal: Strength & Muscle Tone: atrophy Gait & Station: shuffle Patient leans: N/A            Psychiatric Specialty Exam:  Presentation  General Appearance: No data recorded Eye Contact:No data recorded Speech:No data recorded Speech Volume:No data recorded Handedness:No data recorded  Mood and Affect  Mood:No data recorded Affect:No data recorded  Thought Process  Thought Processes:No data recorded Descriptions of Associations:No data recorded Orientation:No data recorded Thought Content:No data recorded History of Schizophrenia/Schizoaffective disorder:No data recorded Duration of Psychotic Symptoms:No data recorded Hallucinations:No data recorded Ideas of Reference:No data recorded Suicidal Thoughts:No data recorded Homicidal Thoughts:No data recorded  Sensorium  Memory:No data recorded Judgment:No data recorded Insight:No data recorded  Executive Functions  Concentration:No data recorded Attention Span:No data recorded Recall:No data recorded Fund of Knowledge:No data recorded Language:No data recorded  Psychomotor Activity  Psychomotor Activity:No data recorded  Assets  Assets:No data recorded  Sleep  Sleep:No data recorded  Physical Exam: Physical Exam Vitals and nursing note reviewed.  Constitutional:      Appearance: She is ill-appearing.  HENT:     Head: Normocephalic and atraumatic.     Mouth/Throat:     Pharynx: Oropharynx  is clear.  Eyes:     Pupils: Pupils are equal, round, and reactive to light.  Cardiovascular:     Rate and Rhythm: Normal rate and regular rhythm.   Pulmonary:     Effort: Pulmonary effort is normal.     Breath sounds: Normal breath sounds.  Abdominal:     General: Abdomen is flat.     Palpations: Abdomen is soft.  Musculoskeletal:        General: Normal range of motion.  Skin:    General: Skin is warm and dry.  Neurological:     General: No focal deficit present.     Mental Status: She is alert. Mental status is at baseline.  Psychiatric:        Attention and Perception: She is inattentive. She perceives auditory hallucinations.        Mood and Affect: Mood is depressed. Affect is blunt.        Speech: Speech is delayed.        Behavior: Behavior is slowed and withdrawn.        Thought Content: Thought content does not include homicidal or suicidal ideation.        Cognition and Memory: Cognition is impaired.        Judgment: Judgment normal.    Review of Systems  Constitutional: Positive for malaise/fatigue and weight loss.  HENT: Negative.   Eyes: Negative.   Respiratory: Negative.   Cardiovascular: Negative.   Gastrointestinal: Negative.   Musculoskeletal: Positive for back pain and myalgias.  Skin: Negative.   Neurological: Negative.   Psychiatric/Behavioral: Positive for depression and hallucinations. The patient is nervous/anxious and has insomnia.    Blood pressure (!) 211/85, pulse 88, temperature 98.5 F (36.9 C), temperature source Oral, resp. rate 19, height _0  (1.575 m), weight 48.5 kg. Body mass index is 19.57 kg/m.  Treatment Plan Summary: Medication management and Plan 60 year old woman with schizoaffective disorder presents with symptoms of depression disorganized thinking hallucinations.  I offered her the option of getting a prescription and continuing outpatient treatment but she says she does not feel safe with that and does not feel like she would be able to comply with her medicine at home.  Patient presents as rather dependent.  She is wanting to be admitted to a geriatric psychiatry unit.  I  have discussed this with TTS and we will look into referral.  Meanwhile labs are reviewed and we will work on getting her on her medicine.  Disposition: Recommend psychiatric Inpatient admission when medically cleared. Supportive therapy provided about ongoing stressors. Discussed crisis plan, support from social network, calling 911, coming to the Emergency Department, and calling Suicide Hotline.  Alethia Berthold, MD 10/13/2020 5:46 PM

## 2020-10-13 NOTE — ED Notes (Signed)
Pt lives with sister.  Pt reports not taking meds since last week.  Pt also reports hearing voices   Pt denies Si or HI.  Pt denies drug or etoh use.  Pt calm and cooperative.  Pt in hallway bed.

## 2020-10-13 NOTE — ED Notes (Signed)
VOL, pend placement 

## 2020-10-13 NOTE — ED Notes (Signed)
Hourly rounding completed at this time, patient currently awake in hallway bed. No complaints, stable, and in no acute distress. Q15 minute rounds and monitoring via Rover and Officer to continue. 

## 2020-10-13 NOTE — ED Notes (Signed)
Pt. Transferred to [place] from ED to room 4 after screening for contraband. Report to include Situation, Background, Assessment and Recommendations from [facility]. Pt. Oriented to unit including Q15 minute rounds as well as the security cameras for their protection. Patient is alert and oriented, warm and dry in no acute distress. Patient denies SI, HI, and pain. She reported to having AVH sometimes. Patient said she is here for eating disorder and not taking her medication. Pt. Encouraged to let me know if needs arise.

## 2020-10-13 NOTE — BH Assessment (Signed)
Referral information for Psychiatric Hospitalization faxed to:  [name] ([phone]---[phone])  [name] [phone]- [phone], [phone])  [facility] [phone])  [name] [phone] or [phone])  .[facility] [phone] -or- [phone])

## 2020-10-13 NOTE — ED Provider Notes (Signed)
[facility] Emergency Department Provider Note ____________________________________________   Event Date/Time   First MD Initiated Contact with Patient 10/13/20 1757     (approximate)  I have reviewed the triage vital signs and the nursing notes.   HISTORY  Chief Complaint Psychiatric Evaluation  Level 5 caveat: Review of systems limited due to disorganized historian  HPI Alicia Rogers is a 60 y.o. female with PMH as noted below including depression and schizophrenia who presents with increased auditory hallucinations, stating she is hearing voices.  She states that this has been over the last 3 months.  She denies any associated suicidal homicidal ideation.  She denies any acute medical complaints.  Past Medical History:  Diagnosis Date  . Depression   . Diabetes mellitus without complication ([place])   . Diabetes mellitus, type II ([place])   . Hypertension   . Schizophrenia [facility])     Patient Active Problem List   Diagnosis Date Noted  . Hypertensive urgency 02/28/2020  . Back pain 02/28/2020  . CKD (chronic kidney disease), stage IIIa 02/28/2020  . Hypokalemia 02/28/2020  . Elevated troponin 02/28/2020  . Hematuria 02/28/2020  . Tobacco abuse 02/28/2020  . Dizziness 02/28/2020  . HLD (hyperlipidemia) 02/28/2020  . Type II diabetes mellitus with renal manifestations ([place]) 02/28/2020  . Compulsive tobacco user syndrome 10/11/2014  . BP (high blood pressure) 09/04/2014  . 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  . Claudication ([place]) 06/09/2014  . Schizoaffective disorder, depressive type ([place]) 06/09/2014  . Diabetes mellitus, type 2 ([place]) 06/09/2014  . Type 2 diabetes mellitus ([place]) 06/09/2014  . Controlled type 2 diabetes mellitus without complication ([place]) 79/89/2119    History reviewed. No pertinent surgical history.  Prior to Admission medications   Medication Sig Start Date End Date Taking? Authorizing Provider   ACCU-CHEK FASTCLIX LANCETS [place]  02/17/15   [provider]  ACCU-CHEK SMARTVIEW test strip  04/02/15   [provider]  amLODipine (NORVASC) 10 MG tablet Take 1 tablet (10 mg total) by mouth daily. 07/02/20   Carrie Mew, MD  aspirin EC 81 MG EC tablet Take 1 tablet (81 mg total) by mouth daily. Swallow whole. 03/01/20   Ezekiel Slocumb, DO  atorvastatin (LIPITOR) 80 MG tablet Take 0.5 tablets (40 mg total) by mouth daily at 6 PM. 07/02/20   Carrie Mew, MD  Blood Glucose Monitoring Suppl (FIFTY50 GLUCOSE METER 2.0) W/DEVICE KIT Check blood sugar 3 times a day. Dx E11.9 07/04/14   [provider]  hydrochlorothiazide (HYDRODIURIL) 12.5 MG tablet Take 12.5 mg by mouth daily. 12/06/19   [provider]  hydrOXYzine (ATARAX/VISTARIL) 25 MG tablet Take 1 tablet (25 mg total) by mouth 3 (three) times daily as needed for anxiety. 07/02/20   Carrie Mew, MD  Lancet Devices (RELION LANCING DEVICE) [place]  05/09/14   [provider]  losartan (COZAAR) 100 MG tablet Take 1 tablet (100 mg total) by mouth daily. 07/02/20   Carrie Mew, MD  lurasidone (LATUDA) 40 MG TABS tablet Take 1 tablet (40 mg total) by mouth daily with breakfast. 07/02/20   Carrie Mew, MD  metFORMIN (GLUCOPHAGE XR) 500 MG 24 hr tablet Take 2 tablets (1,000 mg total) by mouth daily with breakfast. 07/02/20 07/02/21  Carrie Mew, MD  methocarbamol (ROBAXIN) 500 MG tablet Take 1 tablet (500 mg total) by mouth every 8 (eight) hours as needed for muscle spasms. 07/16/20   Menshew, Dannielle Karvonen, PA-C  omeprazole (PRILOSEC) 20 MG capsule  Take 1 capsule (20 mg total) by mouth daily. 07/02/20 07/02/21  Carrie Mew, MD  ondansetron (ZOFRAN ODT) 4 MG disintegrating tablet Take 1 tablet (4 mg total) by mouth every 6 (six) hours as needed for nausea or vomiting. 08/27/20   Delman Kitten, MD  oxybutynin (DITROPAN XL) 15 MG 24 hr tablet Take 1 tablet (15 mg total) by mouth at  bedtime. 07/02/20   Carrie Mew, MD  sitaGLIPtin (JANUVIA) 100 MG tablet Take 1 tablet (100 mg total) by mouth daily. 07/02/20   Carrie Mew, MD  traZODone (DESYREL) 50 MG tablet Take 1 tablet (50 mg total) by mouth at bedtime. 07/02/20   Carrie Mew, MD    Allergies Penicillins  Family History  Problem Relation Age of Onset  . Diabetes Mother   . Glaucoma Mother   . Heart disease Mother   . Heart attack Mother   . Depression Mother   . Hypertension Mother   . Diabetes Father   . Glaucoma Father   . Hypertension Father   . Diabetes Sister   . Depression Sister   . Post-traumatic stress disorder Sister     Social History Social History   Tobacco Use  . Smoking status: Current Every Day Smoker    Packs/day: 1.00    Types: Cigarettes    Start date: 04/21/1978  . Smokeless tobacco: Never Used  Vaping Use  . Vaping Use: Never used  Substance Use Topics  . Alcohol use: No    Alcohol/week: 0.0 standard drinks  . Drug use: No    Review of Systems Level 5 caveat: Review of systems limited due to disorganized historian Constitutional: No fever/chills Cardiovascular: Denies chest pain. Respiratory: Denies shortness of breath. Gastrointestinal: No vomiting. Musculoskeletal: Negative for back pain. Skin: Negative for rash. Neurological: Negative for headache.   ____________________________________________   PHYSICAL EXAM:  VITAL SIGNS: ED Triage Vitals  Enc Vitals Group     BP 10/13/20 1606 (!) 211/85     Pulse Rate 10/13/20 1606 88     Resp 10/13/20 1606 19     Temp 10/13/20 1606 98.5 F (36.9 C)     Temp Source 10/13/20 1606 Oral     SpO2 --      Weight 10/13/20 1602 107 lb (48.5 kg)     Height 10/13/20 1602 '5\' 2"'  (1.575 m)     Head Circumference --      Peak Flow --      Pain Score 10/13/20 1602 3     Pain Loc --      Pain Edu? --      Excl. in [place]? --     Constitutional: Alert and oriented.  Comfortable appearing,, in no acute  distress. Eyes: Conjunctivae are normal.  Head: Atraumatic. Nose: No congestion/rhinnorhea. Mouth/Throat: Mucous membranes are moist.   Neck: Normal range of motion.  Cardiovascular: Good peripheral circulation. Respiratory: Normal respiratory effort.  No retractions.  Gastrointestinal: No distention.  Musculoskeletal: Extremities warm and well perfused.  Neurologic:  Normal speech and language. No gross focal neurologic deficits are appreciated.  Skin:  Skin is warm and dry. No rash noted. Psychiatric: Somewhat flat affect.  Calm and cooperative.  ____________________________________________   LABS (all labs ordered are listed, but only abnormal results are displayed)  Labs Reviewed  COMPREHENSIVE METABOLIC PANEL - Abnormal; Notable for the following components:      Result Value   CO2 21 (*)    Glucose, Bld 274 (*)    Creatinine, Ser 1.21 (*)  Calcium 8.8 (*)    Albumin 2.9 (*)    GFR, Estimated 51 (*)    All other components within normal limits  SALICYLATE LEVEL - Abnormal; Notable for the following components:   Salicylate Lvl <6.7 (*)    All other components within normal limits  ACETAMINOPHEN LEVEL - Abnormal; Notable for the following components:   Acetaminophen (Tylenol), Serum <10 (*)    All other components within normal limits  CBC - Abnormal; Notable for the following components:   WBC 11.6 (*)    RBC 3.43 (*)    Hemoglobin 9.7 (*)    HCT 30.4 (*)    All other components within normal limits  LIPID PANEL - Abnormal; Notable for the following components:   Cholesterol 267 (*)    Triglycerides 180 (*)    LDL Cholesterol 145 (*)    All other components within normal limits  RESP PANEL BY RT-PCR (FLU A&B, COVID) ARPGX2  ETHANOL  TSH  URINE DRUG SCREEN, QUALITATIVE ([facility] ONLY)  HEMOGLOBIN A1C  POC URINE PREG, ED   ____________________________________________  EKG  ED ECG REPORT I, Arta Silence, the attending physician, personally viewed and  interpreted this ECG.  Date: 10/13/2020 EKG Time: 1847 Rate: 50 Rhythm: normal sinus rhythm QRS Axis: normal Intervals: normal ST/T Wave abnormalities: Nonspecific T wave abnormalities diffusely Narrative Interpretation: Nonspecific abnormalities with no evidence of acute ischemia; no significant change when compared to EKG of 08/27/2020  ____________________________________________  RADIOLOGY    ____________________________________________   PROCEDURES  Procedure(s) performed: No  Procedures  Critical Care performed: No ____________________________________________   INITIAL IMPRESSION / ASSESSMENT AND PLAN / ED COURSE  Pertinent labs & imaging results that were available during my care of the patient were reviewed by me and considered in my medical decision making (see chart for details).  60 year old female with PMH as noted above including schizophrenia presents voluntarily with increased auditory hallucinations.  She denies SI or HI, and has no acute medical complaints.  On exam, she is calm and cooperative.  She is hypertensive with otherwise normal vital signs.  Lab work-up was obtained for medical clearance and is within normal limits for the patient.  She is chronically anemic with no acute change today.  Psychiatry was consulted and Dr. Weber Cooks recommends inpatient admission.  At this time there is no evidence of hypertensive urgency or other acute medical issue.  We will recheck the blood pressure and start the patient on her home medications.  ----------------------------------------- 10:50 PM on 10/13/2020 -----------------------------------------  The blood pressure has improved with the patient's home meds as well as a dose of clonidine.  She is ordered for her home meds going forward.  She is medically cleared at this time.  Per psychiatry, the patient has been accepted to [facility] for tomorrow. _________________________  The patient has been placed  in psychiatric observation due to the need to provide a safe environment for the patient while obtaining psychiatric consultation and evaluation, as well as ongoing medical and medication management to treat the patient's condition.  The patient has not been placed under full IVC at this time. ____________________________________________   FINAL CLINICAL IMPRESSION(S) / ED DIAGNOSES  Final diagnoses:  Auditory hallucinations      NEW MEDICATIONS STARTED DURING THIS VISIT:  New Prescriptions   No medications on file     Note:  This document was prepared using Dragon voice recognition software and may include unintentional dictation errors.    Arta Silence, MD 10/13/20 2251

## 2020-10-13 NOTE — ED Notes (Signed)
Report to [facility], Rn at this time

## 2020-10-13 NOTE — BH Assessment (Signed)
PATIENT BED AVAILABLE AFTER 9AM ON 10/14/20  Patient has been accepted to [facility].  Patient assigned to [place] Accepting physician is Dr. Reece Levy.  Call report to [phone].  Representative was Associate Professor (behavioral health intake).   ER Staff is aware of it:  [place] ER Secretary  Dr. Cherylann Banas, ER MD  [facility] Patient's Nurse     Address: [address] [place], [place] [address]

## 2020-10-13 NOTE — ED Notes (Signed)
Pt dressed out into hospital attire. Pt's belongings to include:  1 red shirt 1 pair of jeans 2 brown shoes 2 white socks 1 gray jacket 1 black purse

## 2020-10-13 NOTE — ED Notes (Signed)
Vitals rechecked, secure chat sent to Dr. Cherylann Banas at this time. Awaiting response

## 2020-10-14 LAB — HEMOGLOBIN A1C
Hgb A1c MFr Bld: 7.9 % — ABNORMAL HIGH (ref 4.8–5.6)
Mean Plasma Glucose: 180.03 mg/dL

## 2020-10-14 NOTE — ED Notes (Signed)
Hourly rounding reveals patient in room. No complaints, stable, in no acute distress. Q15 minute rounds and monitoring via Security Cameras to continue. 

## 2020-10-14 NOTE — Progress Notes (Signed)
Inpatient Diabetes Program Recommendations  AACE/ADA: New Consensus Statement on Inpatient Glycemic Control   Target Ranges:  Prepandial:   less than 140 mg/dL      Peak postprandial:   less than 180 mg/dL (1-2 hours)      Critically ill patients:  140 - 180 mg/dL   Results for AVIANCE, [place] (MRN [id number]) as of 10/14/2020 07:26  Ref. Range 10/13/2020 16:11  Glucose Latest Ref Range: 70 - 99 mg/dL 274 (H)  Hemoglobin A1C Latest Ref Range: 4.8 - 5.6 % 7.9 (H)   Review of Glycemic Control  Diabetes history: DM2 Outpatient Diabetes medications: Metformin XR 1000 mg QAM, Januvia 100 mg daily Current orders for Inpatient glycemic control: Metformin 1000 mg QAM  Inpatient Diabetes Program Recommendations:    Insulin: While holding in the ED, please consider ordering CBGs AC&HS and Novolog 0-9 units TID with meals and Novolog 0-5 units QHS.  Thanks, Barnie Alderman, RN, MSN, CDE Diabetes Coordinator Inpatient Diabetes Program [phone] (Team Pager from 8am to 5pm)

## 2020-10-14 NOTE — ED Notes (Signed)
Snack and beverage given. 

## 2020-10-14 NOTE — ED Notes (Signed)
Report called to [facility]. Report given to [facility].

## 2020-12-22 ENCOUNTER — Emergency Department
Admission: EM | Admit: 2020-12-22 | Discharge: 2020-12-22 | Disposition: A | Discharge status: Home / Self Care | Payer: Medicare HMO | Attending: Emergency Medicine | Admitting: Emergency Medicine

## 2020-12-22 ENCOUNTER — Other Ambulatory Visit: Payer: Self-pay

## 2020-12-22 ENCOUNTER — Encounter: Payer: Self-pay | Admitting: Emergency Medicine

## 2020-12-22 ENCOUNTER — Emergency Department: Payer: Medicare HMO

## 2020-12-22 DIAGNOSIS — M791 Myalgia, unspecified site: Secondary | ICD-10-CM

## 2020-12-22 DIAGNOSIS — B349 Viral infection, unspecified: Secondary | ICD-10-CM

## 2020-12-22 DIAGNOSIS — F17210 Nicotine dependence, cigarettes, uncomplicated: Secondary | ICD-10-CM | POA: Insufficient documentation

## 2020-12-22 DIAGNOSIS — Z20822 Contact with and (suspected) exposure to covid-19: Secondary | ICD-10-CM | POA: Diagnosis not present

## 2020-12-22 DIAGNOSIS — R739 Hyperglycemia, unspecified: Secondary | ICD-10-CM

## 2020-12-22 DIAGNOSIS — E1165 Type 2 diabetes mellitus with hyperglycemia: Secondary | ICD-10-CM | POA: Insufficient documentation

## 2020-12-22 DIAGNOSIS — N1831 Chronic kidney disease, stage 3a: Secondary | ICD-10-CM | POA: Diagnosis not present

## 2020-12-22 DIAGNOSIS — R531 Weakness: Secondary | ICD-10-CM | POA: Insufficient documentation

## 2020-12-22 DIAGNOSIS — I129 Hypertensive chronic kidney disease with stage 1 through stage 4 chronic kidney disease, or unspecified chronic kidney disease: Secondary | ICD-10-CM | POA: Diagnosis not present

## 2020-12-22 DIAGNOSIS — Z7984 Long term (current) use of oral hypoglycemic drugs: Secondary | ICD-10-CM | POA: Diagnosis not present

## 2020-12-22 DIAGNOSIS — Z79899 Other long term (current) drug therapy: Secondary | ICD-10-CM | POA: Diagnosis not present

## 2020-12-22 DIAGNOSIS — Z7982 Long term (current) use of aspirin: Secondary | ICD-10-CM | POA: Insufficient documentation

## 2020-12-22 DIAGNOSIS — E1122 Type 2 diabetes mellitus with diabetic chronic kidney disease: Secondary | ICD-10-CM | POA: Diagnosis not present

## 2020-12-22 LAB — CBC WITH DIFFERENTIAL/PLATELET
Abs Immature Granulocytes: 0.04 10*3/uL (ref 0.00–0.07)
Basophils Absolute: 0 10*3/uL (ref 0.0–0.1)
Basophils Relative: 0 %
Eosinophils Absolute: 0 10*3/uL (ref 0.0–0.5)
Eosinophils Relative: 0 %
HCT: 34.9 % — ABNORMAL LOW (ref 36.0–46.0)
Hemoglobin: 11 g/dL — ABNORMAL LOW (ref 12.0–15.0)
Immature Granulocytes: 0 %
Lymphocytes Relative: 12 %
Lymphs Abs: 1.3 10*3/uL (ref 0.7–4.0)
MCH: 27.2 pg (ref 26.0–34.0)
MCHC: 31.5 g/dL (ref 30.0–36.0)
MCV: 86.4 fL (ref 80.0–100.0)
Monocytes Absolute: 0.6 10*3/uL (ref 0.1–1.0)
Monocytes Relative: 6 %
Neutro Abs: 8.9 10*3/uL — ABNORMAL HIGH (ref 1.7–7.7)
Neutrophils Relative %: 82 %
Platelets: 389 10*3/uL (ref 150–400)
RBC: 4.04 MIL/uL (ref 3.87–5.11)
RDW: 14.4 % (ref 11.5–15.5)
WBC: 10.9 10*3/uL — ABNORMAL HIGH (ref 4.0–10.5)
nRBC: 0 % (ref 0.0–0.2)

## 2020-12-22 LAB — COMPREHENSIVE METABOLIC PANEL
ALT: 9 U/L (ref 0–44)
AST: 15 U/L (ref 15–41)
Albumin: 2.6 g/dL — ABNORMAL LOW (ref 3.5–5.0)
Alkaline Phosphatase: 96 U/L (ref 38–126)
Anion gap: 8 (ref 5–15)
BUN: 12 mg/dL (ref 8–23)
CO2: 29 mmol/L (ref 22–32)
Calcium: 9 mg/dL (ref 8.9–10.3)
Chloride: 102 mmol/L (ref 98–111)
Creatinine, Ser: 1.28 mg/dL — ABNORMAL HIGH (ref 0.44–1.00)
GFR, Estimated: 48 mL/min — ABNORMAL LOW (ref >60)
Glucose, Bld: 244 mg/dL — ABNORMAL HIGH (ref 70–99)
Potassium: 3.9 mmol/L (ref 3.5–5.1)
Sodium: 139 mmol/L (ref 135–145)
Total Bilirubin: 0.7 mg/dL (ref 0.3–1.2)
Total Protein: 7.6 g/dL (ref 6.5–8.1)

## 2020-12-22 LAB — URINALYSIS, COMPLETE (UACMP) WITH MICROSCOPIC
Bilirubin Urine: NEGATIVE
Glucose, UA: 150 mg/dL — AB
Ketones, ur: NEGATIVE mg/dL
Leukocytes,Ua: NEGATIVE
Nitrite: NEGATIVE
Protein, ur: >=300 mg/dL — AB
Specific Gravity, Urine: 1.017 (ref 1.005–1.030)
pH: 5 (ref 5.0–8.0)

## 2020-12-22 LAB — SARS CORONAVIRUS 2 (TAT 6-24 HRS): SARS Coronavirus 2: NEGATIVE

## 2020-12-22 MED ORDER — ACETAMINOPHEN 500 MG PO TABS
1000.0000 mg | ORAL_TABLET | Freq: Once | ORAL | Status: AC
  Administered 2020-12-22: 1000 mg via ORAL
  Filled 2020-12-22: qty 2

## 2020-12-22 MED ORDER — LACTATED RINGERS IV BOLUS
1000.0000 mL | Freq: Once | INTRAVENOUS | Status: AC
  Administered 2020-12-22: 1000 mL via INTRAVENOUS

## 2020-12-22 NOTE — Discharge Instructions (Signed)
Use Tylenol for pain and fevers.  Up to 1000 mg per dose, up to 4 times per day.  Do not take more than 4000 mg of Tylenol/acetaminophen within 24 hours..  

## 2020-12-22 NOTE — ED Notes (Signed)
Pt called and no answer.  

## 2020-12-22 NOTE — ED Triage Notes (Signed)
Pt to ED via EMS from Home for generalized body aches, pt states that the pain is worse in her back. Pt denies any known injury. Pt states that she has had her symptoms x 2 weeks.

## 2020-12-22 NOTE — ED Notes (Signed)
See triage note  Presents with generalized body pain   Pain is worse in her neck and back  Pain started about 2 weeks ago

## 2020-12-22 NOTE — ED Provider Notes (Signed)
[facility] Emergency Department Provider Note ____________________________________________   Event Date/Time   First MD Initiated Contact with Patient 12/22/20 1305     (approximate)  I have reviewed the triage vital signs and the nursing notes.  HISTORY  Chief Complaint Generalized Body Aches   HPI Avian S Angus is a 60 y.o. femalewho presents to the ED for evaluation of myalgias and generalized weakness  Chart review indicates history of HTN, DM and schizophrenia.  Patient presents to the ED for evaluation of 2 days of myalgias and generalized weakness.  She told the triage nurse 2 weeks of this, but she is telling me just a couple days of this.  She reports being exposed to a another person with COVID-19 2 weeks ago, and reports feeling more weak for the past 1 week, becoming more achy over the past 2 days.  Denies fevers, cough, shortness of breath, chest pain, Donnell pain, emesis or diarrhea.  Just reports feeling weak and aching all over.  Denies falls or trauma.  Denies syncope.   Past Medical History:  Diagnosis Date  . Depression   . Diabetes mellitus without complication ([place])   . Diabetes mellitus, type II ([place])   . Hypertension   . Schizophrenia [facility])     Patient Active Problem List   Diagnosis Date Noted  . Hypertensive urgency 02/28/2020  . Back pain 02/28/2020  . CKD (chronic kidney disease), stage IIIa 02/28/2020  . Hypokalemia 02/28/2020  . Elevated troponin 02/28/2020  . Hematuria 02/28/2020  . Tobacco abuse 02/28/2020  . Dizziness 02/28/2020  . HLD (hyperlipidemia) 02/28/2020  . Type II diabetes mellitus with renal manifestations ([place]) 02/28/2020  . Compulsive tobacco user syndrome 10/11/2014  . BP (high blood pressure) 09/04/2014  . 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  . Claudication ([place]) 06/09/2014  . Schizoaffective disorder, depressive type ([place]) 06/09/2014  . Diabetes mellitus, type  2 ([place]) 06/09/2014  . Type 2 diabetes mellitus ([place]) 06/09/2014  . Controlled type 2 diabetes mellitus without complication ([place]) 16/05/9603    History reviewed. No pertinent surgical history.  Prior to Admission medications   Medication Sig Start Date End Date Taking? Authorizing Provider  ACCU-CHEK FASTCLIX LANCETS [place]  02/17/15   [provider]  ACCU-CHEK SMARTVIEW test strip  04/02/15   [provider]  amLODipine (NORVASC) 10 MG tablet Take 1 tablet (10 mg total) by mouth daily. 07/02/20   Carrie Mew, MD  aspirin EC 81 MG EC tablet Take 1 tablet (81 mg total) by mouth daily. Swallow whole. 03/01/20   Ezekiel Slocumb, DO  atorvastatin (LIPITOR) 80 MG tablet Take 0.5 tablets (40 mg total) by mouth daily at 6 PM. 07/02/20   Carrie Mew, MD  Blood Glucose Monitoring Suppl (FIFTY50 GLUCOSE METER 2.0) W/DEVICE KIT Check blood sugar 3 times a day. Dx E11.9 07/04/14   [provider]  hydrochlorothiazide (HYDRODIURIL) 12.5 MG tablet Take 12.5 mg by mouth daily. 12/06/19   [provider]  hydrOXYzine (ATARAX/VISTARIL) 25 MG tablet Take 1 tablet (25 mg total) by mouth 3 (three) times daily as needed for anxiety. 07/02/20   Carrie Mew, MD  Lancet Devices (RELION LANCING DEVICE) [place]  05/09/14   [provider]  losartan (COZAAR) 100 MG tablet Take 1 tablet (100 mg total) by mouth daily. 07/02/20   Carrie Mew, MD  lurasidone (LATUDA) 40 MG TABS tablet Take 1 tablet (40 mg total) by mouth daily with breakfast. 07/02/20   Carrie Mew,  MD  metFORMIN (GLUCOPHAGE XR) 500 MG 24 hr tablet Take 2 tablets (1,000 mg total) by mouth daily with breakfast. 07/02/20 07/02/21  Carrie Mew, MD  methocarbamol (ROBAXIN) 500 MG tablet Take 1 tablet (500 mg total) by mouth every 8 (eight) hours as needed for muscle spasms. 07/16/20   Menshew, Dannielle Karvonen, PA-C  omeprazole (PRILOSEC) 20 MG capsule Take 1 capsule (20 mg total) by mouth daily.  07/02/20 07/02/21  Carrie Mew, MD  ondansetron (ZOFRAN ODT) 4 MG disintegrating tablet Take 1 tablet (4 mg total) by mouth every 6 (six) hours as needed for nausea or vomiting. 08/27/20   Delman Kitten, MD  oxybutynin (DITROPAN XL) 15 MG 24 hr tablet Take 1 tablet (15 mg total) by mouth at bedtime. 07/02/20   Carrie Mew, MD  sitaGLIPtin (JANUVIA) 100 MG tablet Take 1 tablet (100 mg total) by mouth daily. 07/02/20   Carrie Mew, MD  traZODone (DESYREL) 50 MG tablet Take 1 tablet (50 mg total) by mouth at bedtime. 07/02/20   Carrie Mew, MD    Allergies Penicillins  Family History  Problem Relation Age of Onset  . Diabetes Mother   . Glaucoma Mother   . Heart disease Mother   . Heart attack Mother   . Depression Mother   . Hypertension Mother   . Diabetes Father   . Glaucoma Father   . Hypertension Father   . Diabetes Sister   . Depression Sister   . Post-traumatic stress disorder Sister     Social History Social History   Tobacco Use  . Smoking status: Current Every Day Smoker    Packs/day: 1.00    Types: Cigarettes    Start date: 04/21/1978  . Smokeless tobacco: Never Used  Vaping Use  . Vaping Use: Never used  Substance Use Topics  . Alcohol use: No    Alcohol/week: 0.0 standard drinks  . Drug use: No    Review of Systems  Constitutional: No fever/chills.  Positive generalized weakness Eyes: No visual changes. ENT: No sore throat. Cardiovascular: Denies chest pain. Respiratory: Denies shortness of breath. Gastrointestinal: No abdominal pain.  No nausea, no vomiting.  No diarrhea.  No constipation. Genitourinary: Negative for dysuria. Musculoskeletal: Positive for myalgias.. Skin: Negative for rash. Neurological: Negative for headaches, focal weakness or numbness.  ____________________________________________   PHYSICAL EXAM:  VITAL SIGNS: Vitals:   12/22/20 1215 12/22/20 1657  BP: (!) 141/90 (!) 190/84  Pulse: 67 69  Resp: 18 16   Temp: 98 F (36.7 C)   SpO2: 100% 100%    Constitutional: Alert and oriented.  Appears uncomfortable, but in no acute distress. Eyes: Conjunctivae are normal. PERRL. EOMI. Head: Atraumatic. Nose: No congestion/rhinnorhea. Mouth/Throat: Mucous membranes are moist.  Oropharynx non-erythematous. Neck: No stridor. No cervical spine tenderness to palpation. Cardiovascular: Normal rate, regular rhythm. Grossly normal heart sounds.  Good peripheral circulation. Respiratory: Normal respiratory effort.  No retractions. Lungs CTAB. Gastrointestinal: Soft , nondistended, nontender to palpation. No CVA tenderness. Musculoskeletal: No lower extremity tenderness nor edema.  No joint effusions. No signs of acute trauma. Neurologic:  Normal speech and language. No gross focal neurologic deficits are appreciated. No gait instability noted. Skin:  Skin is warm, dry and intact. No rash noted. Psychiatric: Mood and affect are normal. Speech and behavior are normal. ____________________________________________   LABS (all labs ordered are listed, but only abnormal results are displayed)  Labs Reviewed  CBC WITH DIFFERENTIAL/PLATELET - Abnormal; Notable for the following components:  Result Value   WBC 10.9 (*)    Hemoglobin 11.0 (*)    HCT 34.9 (*)    Neutro Abs 8.9 (*)    All other components within normal limits  COMPREHENSIVE METABOLIC PANEL - Abnormal; Notable for the following components:   Glucose, Bld 244 (*)    Creatinine, Ser 1.28 (*)    Albumin 2.6 (*)    GFR, Estimated 48 (*)    All other components within normal limits  URINALYSIS, COMPLETE (UACMP) WITH MICROSCOPIC - Abnormal; Notable for the following components:   Color, Urine YELLOW (*)    APPearance HAZY (*)    Glucose, UA 150 (*)    Hgb urine dipstick SMALL (*)    Protein, ur >=300 (*)    Bacteria, UA RARE (*)    All other components within normal limits  SARS CORONAVIRUS 2 (TAT 6-24 HRS)     ____________________________________________  RADIOLOGY  ED MD interpretation: Plain film of the chest reviewed by me without evidence of acute cardiopulmonary pathology.  Official radiology report(s): DG Chest 2 View  Result Date: 12/22/2020 CLINICAL DATA:  Myalgias chills. COVID exposure. Generalized body aches. EXAM: CHEST - 2 VIEW COMPARISON:  Two-view chest x-ray 08/27/2020 FINDINGS: Heart size is normal. Lungs are clear. No edema or effusion is present. Atherosclerotic changes are noted in the aorta. Axial skeleton is unremarkable. IMPRESSION: 1. No acute cardiopulmonary disease. 2. Aortic atherosclerosis. Electronically Signed   By: San Morelle M.D.   On: 12/22/2020 15:08    ____________________________________________   PROCEDURES and INTERVENTIONS  Procedure(s) performed (including Critical Care):  Procedures  Medications  acetaminophen (TYLENOL) tablet 1,000 mg (1,000 mg Oral Given 12/22/20 1324)  lactated ringers bolus 1,000 mL (1,000 mLs Intravenous New Bag/Given 12/22/20 1446)    ____________________________________________   MDM / ED COURSE   60 year old woman presents to the ED for evaluation of a couple days of diffuse myalgias after recent COVID-19 exposure, likely due to a viral syndrome such as COVID-19, and amenable to outpatient management.  Patient without evidence of distress, and has a fairly unremarkable examination besides seeming uncomfortable.  Blood work demonstrates mild hyperglycemia without acidosis.  No evidence of DKA or HHS.  She has a minimal leukocytosis, though improved from multiple recent readings.  No infiltrates on CXR and urinalysis without infectious features.  Considering her recent exposure, and her nonspecific symptoms, anticipate viral syndrome.  We swabbed her for COVID-19 and return precautions for the ED were discussed.   Clinical Course as of 12/22/20 1735  Mon Dec 22, 2020  1727 Reassessed.  Patient reports feeling  a little bit better.  We discussed largely benign work-up.  We discussed likely viral syndrome such as COVID-19 and that she would be suitable for outpatient management.  She is agreeable.  We discussed return precautions. [DS]    Clinical Course User Index [DS] Vladimir Crofts, MD    ____________________________________________   FINAL CLINICAL IMPRESSION(S) / ED DIAGNOSES  Final diagnoses:  Viral syndrome  Myalgia  Hyperglycemia     ED Discharge Orders    None       Kandi Brusseau Tamala Julian   Note:  This document was prepared using Dragon voice recognition software and may include unintentional dictation errors.   Vladimir Crofts, MD 12/22/20 [phone]

## 2020-12-22 NOTE — ED Notes (Signed)
Called 1x with no answer, pt not outside either.

## 2020-12-22 NOTE — ED Notes (Signed)
Pt comes via EMs with c/o back and foot pain.  BP_202/83 HR-70 Temp-97.8 RA-100% RR-15

## 2020-12-31 ENCOUNTER — Other Ambulatory Visit: Payer: Self-pay

## 2020-12-31 ENCOUNTER — Encounter: Payer: Self-pay | Admitting: Emergency Medicine

## 2020-12-31 ENCOUNTER — Emergency Department
Admission: EM | Admit: 2020-12-31 | Discharge: 2020-12-31 | Disposition: A | Discharge status: Home / Self Care | Payer: Medicare HMO | Attending: Emergency Medicine | Admitting: Emergency Medicine

## 2020-12-31 DIAGNOSIS — E1122 Type 2 diabetes mellitus with diabetic chronic kidney disease: Secondary | ICD-10-CM | POA: Insufficient documentation

## 2020-12-31 DIAGNOSIS — N1831 Chronic kidney disease, stage 3a: Secondary | ICD-10-CM | POA: Diagnosis not present

## 2020-12-31 DIAGNOSIS — Z7984 Long term (current) use of oral hypoglycemic drugs: Secondary | ICD-10-CM | POA: Diagnosis not present

## 2020-12-31 DIAGNOSIS — R0981 Nasal congestion: Secondary | ICD-10-CM | POA: Diagnosis present

## 2020-12-31 DIAGNOSIS — J0100 Acute maxillary sinusitis, unspecified: Secondary | ICD-10-CM | POA: Insufficient documentation

## 2020-12-31 DIAGNOSIS — F17210 Nicotine dependence, cigarettes, uncomplicated: Secondary | ICD-10-CM | POA: Diagnosis not present

## 2020-12-31 DIAGNOSIS — Z7982 Long term (current) use of aspirin: Secondary | ICD-10-CM | POA: Diagnosis not present

## 2020-12-31 DIAGNOSIS — Z79899 Other long term (current) drug therapy: Secondary | ICD-10-CM | POA: Insufficient documentation

## 2020-12-31 DIAGNOSIS — I129 Hypertensive chronic kidney disease with stage 1 through stage 4 chronic kidney disease, or unspecified chronic kidney disease: Secondary | ICD-10-CM | POA: Insufficient documentation

## 2020-12-31 MED ORDER — CEPHALEXIN 500 MG PO CAPS: 500.0000 mg | ORAL_CAPSULE | Freq: Three times a day (TID) | ORAL | 0 refills | Status: AC

## 2020-12-31 NOTE — ED Notes (Signed)
Provider aware of patient HTN. Patient denies symptoms r/t BP. Patient ambulatory with steady gait. Patient calling ride home at this time.

## 2020-12-31 NOTE — ED Notes (Signed)
Patient reports "sinus infection" x 2 weeks. Patient reports nasal congestion, nasal drainage, cough, and sore throat. Patient denies sore throat, SOB, nausea, vomiting, or diarrhea.

## 2020-12-31 NOTE — ED Triage Notes (Signed)
Pt comes into the ED via POV c/o nasal congestion that has not gone away since she was seen 1-2 weeks ago.  Pt has even and unlabored respirations at this time and is in NAD.

## 2020-12-31 NOTE — Discharge Instructions (Signed)
Follow up with primary care if not improving over the next week. ° °Return to the ER for symptoms that change or worsen if unable to schedule an appointment. °

## 2020-12-31 NOTE — ED Provider Notes (Signed)
[facility] Emergency Department Provider Note  ____________________________________________  Time seen: Approximately 6:32 PM  I have reviewed the triage vital signs and the nursing notes.   HISTORY  Chief Complaint Nasal Congestion   HPI Alicia Rogers is a 60 y.o. female with a history of diabetes, hypertension, schizophrenia and history as listed below presents to the emergency department for treatment and evaluation of sinus congestion. She was here a couple of weeks ago for COVID 19 testing, which was negative. All symptoms have resolved except sinus congestion. Mucus is now thickened and yellow-green. She denies fever or other symptoms of concern.   Past Medical History:  Diagnosis Date  . Depression   . Diabetes mellitus without complication ([place])   . Diabetes mellitus, type II ([place])   . Hypertension   . Schizophrenia [facility])     Patient Active Problem List   Diagnosis Date Noted  . Hypertensive urgency 02/28/2020  . Back pain 02/28/2020  . CKD (chronic kidney disease), stage IIIa 02/28/2020  . Hypokalemia 02/28/2020  . Elevated troponin 02/28/2020  . Hematuria 02/28/2020  . Tobacco abuse 02/28/2020  . Dizziness 02/28/2020  . HLD (hyperlipidemia) 02/28/2020  . Type II diabetes mellitus with renal manifestations ([place]) 02/28/2020  . Compulsive tobacco user syndrome 10/11/2014  . BP (high blood pressure) 09/04/2014  . 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  . Claudication ([place]) 06/09/2014  . Schizoaffective disorder, depressive type ([place]) 06/09/2014  . Diabetes mellitus, type 2 ([place]) 06/09/2014  . Type 2 diabetes mellitus ([place]) 06/09/2014  . Controlled type 2 diabetes mellitus without complication ([place]) 19/62/2297    History reviewed. No pertinent surgical history.  Prior to Admission medications   Medication Sig Start Date End Date Taking? Authorizing Provider  cephALEXin (KEFLEX) 500 MG capsule Take 1 capsule  (500 mg total) by mouth 3 (three) times daily for 10 days. 12/31/20 01/10/21 Yes Sabrina Arriaga, Dessa Phi, FNP  ACCU-CHEK FASTCLIX LANCETS [place]  02/17/15   [provider]  ACCU-CHEK SMARTVIEW test strip  04/02/15   [provider]  amLODipine (NORVASC) 10 MG tablet Take 1 tablet (10 mg total) by mouth daily. 07/02/20   Carrie Mew, MD  aspirin EC 81 MG EC tablet Take 1 tablet (81 mg total) by mouth daily. Swallow whole. 03/01/20   Ezekiel Slocumb, DO  atorvastatin (LIPITOR) 80 MG tablet Take 0.5 tablets (40 mg total) by mouth daily at 6 PM. 07/02/20   Carrie Mew, MD  Blood Glucose Monitoring Suppl (FIFTY50 GLUCOSE METER 2.0) W/DEVICE KIT Check blood sugar 3 times a day. Dx E11.9 07/04/14   [provider]  hydrochlorothiazide (HYDRODIURIL) 12.5 MG tablet Take 12.5 mg by mouth daily. 12/06/19   [provider]  hydrOXYzine (ATARAX/VISTARIL) 25 MG tablet Take 1 tablet (25 mg total) by mouth 3 (three) times daily as needed for anxiety. 07/02/20   Carrie Mew, MD  Lancet Devices (RELION LANCING DEVICE) [place]  05/09/14   [provider]  losartan (COZAAR) 100 MG tablet Take 1 tablet (100 mg total) by mouth daily. 07/02/20   Carrie Mew, MD  lurasidone (LATUDA) 40 MG TABS tablet Take 1 tablet (40 mg total) by mouth daily with breakfast. 07/02/20   Carrie Mew, MD  metFORMIN (GLUCOPHAGE XR) 500 MG 24 hr tablet Take 2 tablets (1,000 mg total) by mouth daily with breakfast. 07/02/20 07/02/21  Carrie Mew, MD  methocarbamol (ROBAXIN) 500 MG tablet Take 1 tablet (500 mg total) by mouth every 8 (eight) hours as  needed for muscle spasms. 07/16/20   Menshew, Dannielle Karvonen, PA-C  omeprazole (PRILOSEC) 20 MG capsule Take 1 capsule (20 mg total) by mouth daily. 07/02/20 07/02/21  Carrie Mew, MD  ondansetron (ZOFRAN ODT) 4 MG disintegrating tablet Take 1 tablet (4 mg total) by mouth every 6 (six) hours as needed for nausea or vomiting. 08/27/20   Delman Kitten, MD  oxybutynin (DITROPAN XL) 15 MG 24 hr tablet Take 1 tablet (15 mg total) by mouth at bedtime. 07/02/20   Carrie Mew, MD  sitaGLIPtin (JANUVIA) 100 MG tablet Take 1 tablet (100 mg total) by mouth daily. 07/02/20   Carrie Mew, MD  traZODone (DESYREL) 50 MG tablet Take 1 tablet (50 mg total) by mouth at bedtime. 07/02/20   Carrie Mew, MD    Allergies Penicillins  Family History  Problem Relation Age of Onset  . Diabetes Mother   . Glaucoma Mother   . Heart disease Mother   . Heart attack Mother   . Depression Mother   . Hypertension Mother   . Diabetes Father   . Glaucoma Father   . Hypertension Father   . Diabetes Sister   . Depression Sister   . Post-traumatic stress disorder Sister     Social History Social History   Tobacco Use  . Smoking status: Current Every Day Smoker    Packs/day: 1.00    Types: Cigarettes    Start date: 04/21/1978  . Smokeless tobacco: Never Used  Vaping Use  . Vaping Use: Never used  Substance Use Topics  . Alcohol use: No    Alcohol/week: 0.0 standard drinks  . Drug use: No    Review of Systems Constitutional: Negative for fever/chills. normal appetite. ENT:  No sore throat. Positive for bilateral sinus congestion and tenderness. Cardiovascular: Denies chest pain. Respiratory: Negative shortness of breath. Negative for cough. Negative for wheezing.  Gastrointestinal: No nausea,  no vomiting.  No diarrhea.  Musculoskeletal: Negative for body aches Skin: Negative for rash. Neurological: negative for headaches ____________________________________________   PHYSICAL EXAM:  VITAL SIGNS: ED Triage Vitals  Enc Vitals Group     BP 12/31/20 1706 (!) 190/68     Pulse Rate 12/31/20 1706 83     Resp 12/31/20 1706 15     Temp 12/31/20 1706 98.6 F (37 C)     Temp Source 12/31/20 1706 Oral     SpO2 12/31/20 1706 98 %     Weight 12/31/20 1704 106 lb (48.1 kg)     Height 12/31/20 1704 '5\' 3"'  (1.6 m)     Head  Circumference --      Peak Flow --      Pain Score 12/31/20 1704 0     Pain Loc --      Pain Edu? --      Excl. in [place]? --     Constitutional: Alert and oriented. Chronically ill appearing and in no acute distress. Eyes: Conjunctivae are normal. Ears: TM normal Nose: maxillary sinus congestion noted; thick, yellow rhinnorhea. Mouth/Throat: Mucous membranes are moist.  Oropharynx Normal. Tonsils normal. Uvula midline. Neck: No stridor.  Lymphatic: No cervical lymphadenopathy. Cardiovascular: Normal rate, regular rhythm. Good peripheral circulation. Respiratory: Respirations are even and unlabored.  No retractions. Breath sounds clear to auscultation. Gastrointestinal: Soft and nontender.  Musculoskeletal: FROM x 4 extremities.  Neurologic:  Normal speech and language. Skin:  Skin is warm, dry and intact. No rash noted. Psychiatric: Mood and affect are normal. Speech and behavior are normal.  ____________________________________________   LABS (all labs ordered are listed, but only abnormal results are displayed)  Labs Reviewed - No data to display ____________________________________________  EKG  Not indicated. ____________________________________________  RADIOLOGY  Not indicated. ____________________________________________   PROCEDURES  Procedure(s) performed: None  Critical Care performed: No ____________________________________________   INITIAL IMPRESSION / ASSESSMENT AND PLAN / ED COURSE  60 y.o. female presenting to the emergency department for treatment and evaluation of sinus congestion.  See HPI for further details.  Vital signs are stable.  She is hypertensive but this appears to be at or near her baseline.  She is not tachycardic or febrile and has a normal respiratory rate and normal oxygen saturation.  Low suspicion for COVID-19.  She was tested on 23 May and was negative.  Work-up that day included labs and a chest x-ray as well.  She reports her  symptoms have resolved from her previous visit with the exception of sinus congestion.  Plan will be to treat with Keflex and have her continue her Mucinex as previously advised.  She was advised to follow-up with her primary care provider if not improving over the week.  She was advised to return to the emergency department for symptoms of change or worsen she is unable to schedule appointment.    Medications - No data to display  ED Discharge Orders         Ordered    cephALEXin (KEFLEX) 500 MG capsule  3 times daily        12/31/20 1842           Pertinent labs & imaging results that were available during my care of the patient were reviewed by me and considered in my medical decision making (see chart for details).    If controlled substance prescribed during this visit, 12 month history viewed on the [place] prior to issuing an initial prescription for Schedule II or III opiod. ____________________________________________   FINAL CLINICAL IMPRESSION(S) / ED DIAGNOSES  Final diagnoses:  Acute maxillary sinusitis, recurrence not specified    Note:  This document was prepared using Dragon voice recognition software and may include unintentional dictation errors.    Victorino Dike, FNP 12/31/20 1901    Vanessa [place], MD 12/31/20 [phone]

## 2021-03-03 DIAGNOSIS — N858 Other specified noninflammatory disorders of uterus: Secondary | ICD-10-CM | POA: Insufficient documentation

## 2021-03-21 ENCOUNTER — Emergency Department: Payer: Medicare Other

## 2021-03-21 ENCOUNTER — Emergency Department
Admission: EM | Admit: 2021-03-21 | Discharge: 2021-03-21 | Disposition: A | Discharge status: Home / Self Care | Payer: Medicare Other | Attending: Emergency Medicine | Admitting: Emergency Medicine

## 2021-03-21 ENCOUNTER — Other Ambulatory Visit: Payer: Self-pay

## 2021-03-21 DIAGNOSIS — R079 Chest pain, unspecified: Secondary | ICD-10-CM | POA: Insufficient documentation

## 2021-03-21 DIAGNOSIS — N1831 Chronic kidney disease, stage 3a: Secondary | ICD-10-CM | POA: Insufficient documentation

## 2021-03-21 DIAGNOSIS — E1122 Type 2 diabetes mellitus with diabetic chronic kidney disease: Secondary | ICD-10-CM | POA: Insufficient documentation

## 2021-03-21 DIAGNOSIS — M25552 Pain in left hip: Secondary | ICD-10-CM | POA: Diagnosis present

## 2021-03-21 DIAGNOSIS — Z79899 Other long term (current) drug therapy: Secondary | ICD-10-CM | POA: Diagnosis not present

## 2021-03-21 DIAGNOSIS — Z7982 Long term (current) use of aspirin: Secondary | ICD-10-CM | POA: Diagnosis not present

## 2021-03-21 DIAGNOSIS — R52 Pain, unspecified: Secondary | ICD-10-CM

## 2021-03-21 DIAGNOSIS — F17210 Nicotine dependence, cigarettes, uncomplicated: Secondary | ICD-10-CM | POA: Insufficient documentation

## 2021-03-21 DIAGNOSIS — Z7984 Long term (current) use of oral hypoglycemic drugs: Secondary | ICD-10-CM | POA: Diagnosis not present

## 2021-03-21 DIAGNOSIS — I129 Hypertensive chronic kidney disease with stage 1 through stage 4 chronic kidney disease, or unspecified chronic kidney disease: Secondary | ICD-10-CM | POA: Insufficient documentation

## 2021-03-21 LAB — CBC
HCT: 35.1 % — ABNORMAL LOW (ref 36.0–46.0)
Hemoglobin: 11 g/dL — ABNORMAL LOW (ref 12.0–15.0)
MCH: 26.6 pg (ref 26.0–34.0)
MCHC: 31.3 g/dL (ref 30.0–36.0)
MCV: 84.8 fL (ref 80.0–100.0)
Platelets: 458 10*3/uL — ABNORMAL HIGH (ref 150–400)
RBC: 4.14 MIL/uL (ref 3.87–5.11)
RDW: 15.3 % (ref 11.5–15.5)
WBC: 9.7 10*3/uL (ref 4.0–10.5)
nRBC: 0 % (ref 0.0–0.2)

## 2021-03-21 LAB — BASIC METABOLIC PANEL
Anion gap: 7 (ref 5–15)
BUN: 18 mg/dL (ref 8–23)
CO2: 27 mmol/L (ref 22–32)
Calcium: 9.5 mg/dL (ref 8.9–10.3)
Chloride: 105 mmol/L (ref 98–111)
Creatinine, Ser: 1.48 mg/dL — ABNORMAL HIGH (ref 0.44–1.00)
GFR, Estimated: 40 mL/min — ABNORMAL LOW (ref >60)
Glucose, Bld: 125 mg/dL — ABNORMAL HIGH (ref 70–99)
Potassium: 3.8 mmol/L (ref 3.5–5.1)
Sodium: 139 mmol/L (ref 135–145)

## 2021-03-21 LAB — TROPONIN I (HIGH SENSITIVITY)
Troponin I (High Sensitivity): 21 ng/L — ABNORMAL HIGH (ref <18)
Troponin I (High Sensitivity): 20 ng/L — ABNORMAL HIGH (ref <18)

## 2021-03-21 MED ORDER — LIDOCAINE 5 % EX PTCH: 1.0000 | MEDICATED_PATCH | Freq: Two times a day (BID) | CUTANEOUS | 0 refills | Status: DC

## 2021-03-21 MED ORDER — IBUPROFEN 400 MG PO TABS
400.0000 mg | ORAL_TABLET | Freq: Once | ORAL | Status: AC
  Administered 2021-03-21: 400 mg via ORAL
  Filled 2021-03-21: qty 1

## 2021-03-21 MED ORDER — LIDOCAINE 5 % EX PTCH
1.0000 | MEDICATED_PATCH | CUTANEOUS | Status: DC
  Administered 2021-03-21: 1 via TRANSDERMAL
  Filled 2021-03-21: qty 1

## 2021-03-21 NOTE — ED Notes (Signed)
Pt provided crackers and ginger ale per request

## 2021-03-21 NOTE — ED Provider Notes (Signed)
[facility] Emergency Department Provider Note   ____________________________________________   I have reviewed the triage vital signs and the nursing notes.   HISTORY  Chief Complaint Chest Pain and Hip Pain   History limited by: Not Limited   HPI Alicia Rogers is a 60 y.o. female who presents to the emergency department today with complaints of both hip and chest pain.  The patient states that both symptoms started today.  Her chest pain is located in her right chest.  She denies any associated shortness of breath nausea or vomiting.  She denies similar symptoms in the past.  In addition she is complaining of some left hip pain.  This also started this morning.  She denies any falls or trauma to her hip.  She denies any pain that radiates down to her leg or foot or numbness in her leg or foot. She denies any fevers.    Records reviewed. Per medical record review patient has a history of HTN, DM.  Past Medical History:  Diagnosis Date   Depression    Diabetes mellitus without complication ([place])    Diabetes mellitus, type II ([place])    Hypertension    Schizophrenia ([place])     Patient Active Problem List   Diagnosis Date Noted   Hypertensive urgency 02/28/2020   Back pain 02/28/2020   CKD (chronic kidney disease), stage IIIa 02/28/2020   Hypokalemia 02/28/2020   Elevated troponin 02/28/2020   Hematuria 02/28/2020   Tobacco abuse 02/28/2020   Dizziness 02/28/2020   HLD (hyperlipidemia) 02/28/2020   Type II diabetes mellitus with renal manifestations ([place]) 02/28/2020   Compulsive tobacco user syndrome 10/11/2014   BP (high blood pressure) 09/04/2014   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   Claudication ([place]) 06/09/2014   Schizoaffective disorder, depressive type ([place]) 06/09/2014   Diabetes mellitus, type 2 ([place]) 06/09/2014   Type 2 diabetes mellitus ([place]) 06/09/2014   Controlled type 2 diabetes mellitus without  complication ([place]) 67/67/2094    History reviewed. No pertinent surgical history.  Prior to Admission medications   Medication Sig Start Date End Date Taking? Authorizing Provider  ACCU-CHEK FASTCLIX LANCETS [place]  02/17/15   [provider]  ACCU-CHEK SMARTVIEW test strip  04/02/15   [provider]  amLODipine (NORVASC) 10 MG tablet Take 1 tablet (10 mg total) by mouth daily. 07/02/20   Carrie Mew, MD  aspirin EC 81 MG EC tablet Take 1 tablet (81 mg total) by mouth daily. Swallow whole. 03/01/20   Ezekiel Slocumb, DO  atorvastatin (LIPITOR) 80 MG tablet Take 0.5 tablets (40 mg total) by mouth daily at 6 PM. 07/02/20   Carrie Mew, MD  Blood Glucose Monitoring Suppl (FIFTY50 GLUCOSE METER 2.0) W/DEVICE KIT Check blood sugar 3 times a day. Dx E11.9 07/04/14   [provider]  hydrochlorothiazide (HYDRODIURIL) 12.5 MG tablet Take 12.5 mg by mouth daily. 12/06/19   [provider]  hydrOXYzine (ATARAX/VISTARIL) 25 MG tablet Take 1 tablet (25 mg total) by mouth 3 (three) times daily as needed for anxiety. 07/02/20   Carrie Mew, MD  Lancet Devices (RELION LANCING DEVICE) [place]  05/09/14   [provider]  losartan (COZAAR) 100 MG tablet Take 1 tablet (100 mg total) by mouth daily. 07/02/20   Carrie Mew, MD  lurasidone (LATUDA) 40 MG TABS tablet Take 1 tablet (40 mg total) by mouth daily with breakfast. 07/02/20   Carrie Mew, MD  metFORMIN (GLUCOPHAGE XR) 500 MG 24 hr  tablet Take 2 tablets (1,000 mg total) by mouth daily with breakfast. 07/02/20 07/02/21  Carrie Mew, MD  methocarbamol (ROBAXIN) 500 MG tablet Take 1 tablet (500 mg total) by mouth every 8 (eight) hours as needed for muscle spasms. 07/16/20   Menshew, Dannielle Karvonen, PA-C  omeprazole (PRILOSEC) 20 MG capsule Take 1 capsule (20 mg total) by mouth daily. 07/02/20 07/02/21  Carrie Mew, MD  ondansetron (ZOFRAN ODT) 4 MG disintegrating tablet Take 1 tablet (4 mg  total) by mouth every 6 (six) hours as needed for nausea or vomiting. 08/27/20   Delman Kitten, MD  oxybutynin (DITROPAN XL) 15 MG 24 hr tablet Take 1 tablet (15 mg total) by mouth at bedtime. 07/02/20   Carrie Mew, MD  sitaGLIPtin (JANUVIA) 100 MG tablet Take 1 tablet (100 mg total) by mouth daily. 07/02/20   Carrie Mew, MD  traZODone (DESYREL) 50 MG tablet Take 1 tablet (50 mg total) by mouth at bedtime. 07/02/20   Carrie Mew, MD    Allergies Penicillins  Family History  Problem Relation Age of Onset   Diabetes Mother    Glaucoma Mother    Heart disease Mother    Heart attack Mother    Depression Mother    Hypertension Mother    Diabetes Father    Glaucoma Father    Hypertension Father    Diabetes Sister    Depression Sister    Post-traumatic stress disorder Sister     Social History Social History   Tobacco Use   Smoking status: Every Day    Packs/day: 1.00    Types: Cigarettes    Start date: 04/21/1978   Smokeless tobacco: Never  Vaping Use   Vaping Use: Never used  Substance Use Topics   Alcohol use: No    Alcohol/week: 0.0 standard drinks   Drug use: No    Review of Systems Constitutional: No fever/chills Eyes: No visual changes. ENT: No sore throat. Cardiovascular: Positive for right chest pain. Respiratory: Denies shortness of breath. Gastrointestinal: No abdominal pain.  No nausea, no vomiting.  No diarrhea.   Genitourinary: Negative for dysuria. Musculoskeletal: Positive for left hip pain. Skin: Negative for rash. Neurological: Negative for headaches, focal weakness or numbness.  ____________________________________________   PHYSICAL EXAM:  VITAL SIGNS: ED Triage Vitals  Enc Vitals Group     BP 03/21/21 1418 (!) 143/62     Pulse Rate 03/21/21 1417 62     Resp 03/21/21 1417 18     Temp 03/21/21 1417 99 F (37.2 C)     Temp Source 03/21/21 1417 Oral     SpO2 03/21/21 1417 100 %     Weight 03/21/21 1415 102 lb (46.3 kg)      Height 03/21/21 1415 '5\' 3"'  (1.6 m)     Head Circumference --      Peak Flow --      Pain Score 03/21/21 1415 10   Constitutional: Alert and oriented.  Eyes: Conjunctivae are normal.  ENT      Head: Normocephalic and atraumatic.      Nose: No congestion/rhinnorhea.      Mouth/Throat: Mucous membranes are moist.      Neck: No stridor. Hematological/Lymphatic/Immunilogical: No cervical lymphadenopathy. Cardiovascular: Normal rate, regular rhythm.  No murmurs, rubs, or gallops.  Respiratory: Normal respiratory effort without tachypnea nor retractions. Breath sounds are clear and equal bilaterally. No wheezes/rales/rhonchi. Gastrointestinal: Soft and non tender. No rebound. No guarding.  Genitourinary: Deferred Musculoskeletal: Normal range of motion in all  extremities. No lower extremity edema. Neurologic:  Normal speech and language. No gross focal neurologic deficits are appreciated.  Skin:  Skin is warm, dry and intact. No rash noted. Psychiatric: Mood and affect are normal. Speech and behavior are normal. Patient exhibits appropriate insight and judgment.  ____________________________________________    LABS (pertinent positives/negatives)  Trop hs 21to 20 BMP na 139, k 3.8, glu 125, cr 1.48 CBC wbc 9.7, hgb 11.0, plt 458  ____________________________________________   EKG  I, Nance Pear, attending physician, personally viewed and interpreted this EKG  EKG Time: 1412 Rate: 67 Rhythm: sinus rhythm Axis: normal Intervals: qtc 405 QRS: narrow ST changes: no st elevation, t wave inversion V4-V6 Impression: abnormal ekg   ____________________________________________    RADIOLOGY  CXR No active cardiopulmonary disease  Left hip No acute abnormality ____________________________________________   PROCEDURES  Procedures  ____________________________________________   INITIAL IMPRESSION / ASSESSMENT AND PLAN / ED COURSE  Pertinent labs & imaging  results that were available during my care of the patient were reviewed by me and considered in my medical decision making (see chart for details).   Patient presents to the emergency department today because of concerns for left hip and right chest pain.  This started today.  Denies any trauma to the left hip.  X-ray was obtained.  This did not show any acute osseous abnormality.  At this time unclear etiology of the patient's hip pain.  Do wonder if she might have some bursitis.  Will give orthopedic follow-up.  In terms of the chest pain initial troponin was minimally elevated at 21.  She does have history of elevated troponins in the past.  It was repeated and stayed the same.  At this time I doubt ACS.  Additionally doubt PE given lack of shortness of breath, tachycardia or hypoxia.  Do not think this would represent dissection.  Chest x-ray without any concerning abnormalities.  Patient did states she felt some improvement after pain medication here.  Will plan on discharging back to group home.  ____________________________________________   FINAL CLINICAL IMPRESSION(S) / ED DIAGNOSES  Final diagnoses:  Left hip pain  Nonspecific chest pain     Note: This dictation was prepared with Dragon dictation. Any transcriptional errors that result from this process are unintentional     Nance Pear, MD 03/21/21 1733

## 2021-03-21 NOTE — Discharge Instructions (Addendum)
Please seek medical attention for any high fevers, chest pain, shortness of breath, change in behavior, persistent vomiting, bloody stool or any other new or concerning symptoms.  

## 2021-03-21 NOTE — ED Notes (Signed)
Sister is en route to pick up pt

## 2021-03-21 NOTE — ED Triage Notes (Signed)
First nurse note: Pt comes ems from group home with left hip pain starting today. When she got in ems truck, started c/o some right sided chest pain. EKG WNL. 120/68. HR 75. CBG 179.

## 2021-03-21 NOTE — ED Notes (Addendum)
Pt asking for ambulance back to group home. Pt informed that would probably not happen, but I would look into a ride. Group home address is [address] [place] per pt. She stated she did not know the phone number.

## 2021-03-27 ENCOUNTER — Other Ambulatory Visit: Payer: Self-pay | Admitting: Obstetrics and Gynecology

## 2021-03-27 DIAGNOSIS — R1900 Intra-abdominal and pelvic swelling, mass and lump, unspecified site: Secondary | ICD-10-CM

## 2021-03-31 ENCOUNTER — Other Ambulatory Visit: Payer: Self-pay | Admitting: Nurse Practitioner

## 2021-03-31 DIAGNOSIS — R1900 Intra-abdominal and pelvic swelling, mass and lump, unspecified site: Secondary | ICD-10-CM

## 2021-04-08 ENCOUNTER — Ambulatory Visit
Admission: RE | Admit: 2021-04-08 | Discharge: 2021-04-08 | Disposition: A | Discharge status: Home / Self Care | Payer: Medicare Other | Source: Ambulatory Visit | Attending: Obstetrics and Gynecology | Admitting: Obstetrics and Gynecology

## 2021-04-08 ENCOUNTER — Other Ambulatory Visit: Payer: Self-pay

## 2021-04-08 DIAGNOSIS — R1900 Intra-abdominal and pelvic swelling, mass and lump, unspecified site: Secondary | ICD-10-CM | POA: Insufficient documentation

## 2021-04-08 MED ORDER — GADOBUTROL 1 MMOL/ML IV SOLN
5.0000 mL | Freq: Once | INTRAVENOUS | Status: AC | PRN
  Administered 2021-04-08: 5 mL via INTRAVENOUS

## 2021-05-10 ENCOUNTER — Emergency Department: Payer: Medicare Other

## 2021-05-10 ENCOUNTER — Emergency Department
Admission: EM | Admit: 2021-05-10 | Discharge: 2021-05-10 | Disposition: A | Discharge status: Home / Self Care | Payer: Medicare Other | Attending: Emergency Medicine | Admitting: Emergency Medicine

## 2021-05-10 ENCOUNTER — Other Ambulatory Visit: Payer: Self-pay

## 2021-05-10 DIAGNOSIS — F17210 Nicotine dependence, cigarettes, uncomplicated: Secondary | ICD-10-CM | POA: Diagnosis not present

## 2021-05-10 DIAGNOSIS — N183 Chronic kidney disease, stage 3 unspecified: Secondary | ICD-10-CM | POA: Diagnosis not present

## 2021-05-10 DIAGNOSIS — Z7984 Long term (current) use of oral hypoglycemic drugs: Secondary | ICD-10-CM | POA: Insufficient documentation

## 2021-05-10 DIAGNOSIS — Z7982 Long term (current) use of aspirin: Secondary | ICD-10-CM | POA: Diagnosis not present

## 2021-05-10 DIAGNOSIS — E1122 Type 2 diabetes mellitus with diabetic chronic kidney disease: Secondary | ICD-10-CM | POA: Insufficient documentation

## 2021-05-10 DIAGNOSIS — I129 Hypertensive chronic kidney disease with stage 1 through stage 4 chronic kidney disease, or unspecified chronic kidney disease: Secondary | ICD-10-CM | POA: Diagnosis not present

## 2021-05-10 DIAGNOSIS — M79602 Pain in left arm: Secondary | ICD-10-CM | POA: Diagnosis present

## 2021-05-10 DIAGNOSIS — Z79899 Other long term (current) drug therapy: Secondary | ICD-10-CM | POA: Insufficient documentation

## 2021-05-10 LAB — CBC WITH DIFFERENTIAL/PLATELET
Abs Immature Granulocytes: 0.04 10*3/uL (ref 0.00–0.07)
Basophils Absolute: 0 10*3/uL (ref 0.0–0.1)
Basophils Relative: 1 %
Eosinophils Absolute: 0.1 10*3/uL (ref 0.0–0.5)
Eosinophils Relative: 2 %
HCT: 29.3 % — ABNORMAL LOW (ref 36.0–46.0)
Hemoglobin: 9.2 g/dL — ABNORMAL LOW (ref 12.0–15.0)
Immature Granulocytes: 1 %
Lymphocytes Relative: 25 %
Lymphs Abs: 1.8 10*3/uL (ref 0.7–4.0)
MCH: 27.5 pg (ref 26.0–34.0)
MCHC: 31.4 g/dL (ref 30.0–36.0)
MCV: 87.5 fL (ref 80.0–100.0)
Monocytes Absolute: 1.1 10*3/uL — ABNORMAL HIGH (ref 0.1–1.0)
Monocytes Relative: 15 %
Neutro Abs: 4.3 10*3/uL (ref 1.7–7.7)
Neutrophils Relative %: 56 %
Platelets: 287 10*3/uL (ref 150–400)
RBC: 3.35 MIL/uL — ABNORMAL LOW (ref 3.87–5.11)
RDW: 16.7 % — ABNORMAL HIGH (ref 11.5–15.5)
WBC: 7.4 10*3/uL (ref 4.0–10.5)
nRBC: 0 % (ref 0.0–0.2)

## 2021-05-10 LAB — COMPREHENSIVE METABOLIC PANEL
ALT: 17 U/L (ref 0–44)
AST: 28 U/L (ref 15–41)
Albumin: 2.9 g/dL — ABNORMAL LOW (ref 3.5–5.0)
Alkaline Phosphatase: 56 U/L (ref 38–126)
Anion gap: 7 (ref 5–15)
BUN: 22 mg/dL (ref 8–23)
CO2: 28 mmol/L (ref 22–32)
Calcium: 9.7 mg/dL (ref 8.9–10.3)
Chloride: 103 mmol/L (ref 98–111)
Creatinine, Ser: 1.34 mg/dL — ABNORMAL HIGH (ref 0.44–1.00)
GFR, Estimated: 45 mL/min — ABNORMAL LOW (ref >60)
Glucose, Bld: 110 mg/dL — ABNORMAL HIGH (ref 70–99)
Potassium: 4.4 mmol/L (ref 3.5–5.1)
Sodium: 138 mmol/L (ref 135–145)
Total Bilirubin: 0.3 mg/dL (ref 0.3–1.2)
Total Protein: 6.8 g/dL (ref 6.5–8.1)

## 2021-05-10 LAB — PROTIME-INR
INR: 0.9 (ref 0.8–1.2)
Prothrombin Time: 12.5 seconds (ref 11.4–15.2)

## 2021-05-10 MED ORDER — IOHEXOL 350 MG/ML SOLN
75.0000 mL | Freq: Once | INTRAVENOUS | Status: AC | PRN
  Administered 2021-05-10: 75 mL via INTRAVENOUS
  Filled 2021-05-10: qty 75

## 2021-05-10 MED ORDER — PREDNISONE 50 MG PO TABS: 50.0000 mg | ORAL_TABLET | Freq: Every day | ORAL | 0 refills | Status: DC

## 2021-05-10 NOTE — ED Provider Notes (Signed)
[facility] Emergency Department Provider Note  ____________________________________________  Time seen: Approximately 5:55 PM  I have reviewed the triage vital signs and the nursing notes.   HISTORY  Chief Complaint Arm Pain    HPI Alicia Rogers is a 60 y.o. female who presents the emergency department via EMS from a group home for several complaints.  Patient is somewhat of a rambling historian, endorses headache, shoulder pain, weakness, unilateral weakness.  Patient states at first that she had a headache, then she did not have a headache, she states that she has had some neck pain but then no neck pain just shoulder pain.  Patient states that she had had some weakness all over but then it seems primarily localized to the left side.  Patient currently denies any headache, visual changes, chest pain, shortness of breath.  She is primarily complaining of weakness of the left upper extremity.  Symptoms began 4 days ago.  Of note patient did have a COVID booster the day before her symptoms began.  She denies any nasal congestion, fevers, chills, sore throat, cough, abdominal pain, nausea vomiting, diarrhea or constipation.  History of depression, schizophrenia, hypertension, diabetes, chronic kidney disease, hyperlipidemia, schizoaffective disorder.       Past Medical History:  Diagnosis Date   Depression    Diabetes mellitus without complication ([place])    Diabetes mellitus, type II ([place])    Hypertension    Schizophrenia ([place])     Patient Active Problem List   Diagnosis Date Noted   Hypertensive urgency 02/28/2020   Back pain 02/28/2020   CKD (chronic kidney disease), stage IIIa 02/28/2020   Hypokalemia 02/28/2020   Elevated troponin 02/28/2020   Hematuria 02/28/2020   Tobacco abuse 02/28/2020   Dizziness 02/28/2020   HLD (hyperlipidemia) 02/28/2020   Type II diabetes mellitus with renal manifestations ([place]) 02/28/2020   Compulsive tobacco user  syndrome 10/11/2014   BP (high blood pressure) 09/04/2014   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   Claudication ([place]) 06/09/2014   Schizoaffective disorder, depressive type ([place]) 06/09/2014   Diabetes mellitus, type 2 ([place]) 06/09/2014   Type 2 diabetes mellitus ([place]) 06/09/2014   Controlled type 2 diabetes mellitus without complication ([place]) 14/97/0263    History reviewed. No pertinent surgical history.  Prior to Admission medications   Medication Sig Start Date End Date Taking? Authorizing Provider  predniSONE (DELTASONE) 50 MG tablet Take 1 tablet (50 mg total) by mouth daily with breakfast. 05/10/21  Yes Reyhan Moronta, Charline Bills, PA-C  ACCU-CHEK FASTCLIX LANCETS MISC  02/17/15   [provider]  ACCU-CHEK SMARTVIEW test strip  04/02/15   [provider]  acetaminophen (TYLENOL) 325 MG tablet Take 325 mg by mouth every 4 (four) hours as needed. 03/17/21 03/17/22  [provider]  amLODipine (NORVASC) 10 MG tablet Take 1 tablet (10 mg total) by mouth daily. 07/02/20   Carrie Mew, MD  aspirin EC 81 MG EC tablet Take 1 tablet (81 mg total) by mouth daily. Swallow whole. 03/01/20   Ezekiel Slocumb, DO  atorvastatin (LIPITOR) 80 MG tablet Take 0.5 tablets (40 mg total) by mouth daily at 6 PM. 07/02/20   Carrie Mew, MD  Blood Glucose Monitoring Suppl (FIFTY50 GLUCOSE METER 2.0) W/DEVICE KIT Check blood sugar 3 times a day. Dx E11.9 07/04/14   [provider]  hydrochlorothiazide (HYDRODIURIL) 12.5 MG tablet Take 12.5 mg by mouth daily. Patient not taking: Reported on 03/21/2021 12/06/19   [provider]  hydrOXYzine (ATARAX/VISTARIL) 25 MG tablet Take 1 tablet (25 mg total) by mouth 3 (three) times daily as needed for anxiety. Patient not taking: Reported on 03/21/2021 07/02/20   Carrie Mew, MD  Lancet Devices (RELION LANCING DEVICE) [place]  05/09/14   [provider]  lidocaine (LIDODERM) 5 % Place 1 patch onto  the skin every 12 (twelve) hours. Remove & Discard patch within 12 hours or as directed by MD 03/21/21 03/21/22  Nance Pear, MD  loperamide (IMODIUM) 2 MG capsule Take 2 mg by mouth daily as needed. 03/17/21   [provider]  losartan (COZAAR) 100 MG tablet Take 1 tablet (100 mg total) by mouth daily. 07/02/20   Carrie Mew, MD  lurasidone (LATUDA) 40 MG TABS tablet Take 1 tablet (40 mg total) by mouth daily with breakfast. 07/02/20   Carrie Mew, MD  metFORMIN (GLUCOPHAGE XR) 500 MG 24 hr tablet Take 2 tablets (1,000 mg total) by mouth daily with breakfast. Patient not taking: Reported on 03/21/2021 07/02/20 07/02/21  Carrie Mew, MD  metFORMIN (GLUCOPHAGE) 500 MG tablet Take 500 mg by mouth 2 (two) times daily. 03/09/21   [provider]  methocarbamol (ROBAXIN) 500 MG tablet Take 1 tablet (500 mg total) by mouth every 8 (eight) hours as needed for muscle spasms. Patient not taking: Reported on 03/21/2021 07/16/20   Menshew, Dannielle Karvonen, PA-C  omeprazole (PRILOSEC) 20 MG capsule Take 1 capsule (20 mg total) by mouth daily. Patient not taking: Reported on 03/21/2021 07/02/20 07/02/21  Carrie Mew, MD  ondansetron (ZOFRAN ODT) 4 MG disintegrating tablet Take 1 tablet (4 mg total) by mouth every 6 (six) hours as needed for nausea or vomiting. Patient not taking: Reported on 03/21/2021 08/27/20   Delman Kitten, MD  oxybutynin (DITROPAN XL) 15 MG 24 hr tablet Take 1 tablet (15 mg total) by mouth at bedtime. 07/02/20   Carrie Mew, MD  sitaGLIPtin (JANUVIA) 100 MG tablet Take 1 tablet (100 mg total) by mouth daily. 07/02/20   Carrie Mew, MD  traZODone (DESYREL) 50 MG tablet Take 1 tablet (50 mg total) by mouth at bedtime. 07/02/20   Carrie Mew, MD    Allergies Penicillins  Family History  Problem Relation Age of Onset   Diabetes Mother    Glaucoma Mother    Heart disease Mother    Heart attack Mother    Depression Mother    Hypertension  Mother    Diabetes Father    Glaucoma Father    Hypertension Father    Diabetes Sister    Depression Sister    Post-traumatic stress disorder Sister     Social History Social History   Tobacco Use   Smoking status: Every Day    Packs/day: 1.00    Types: Cigarettes    Start date: 04/21/1978   Smokeless tobacco: Never  Vaping Use   Vaping Use: Never used  Substance Use Topics   Alcohol use: No    Alcohol/week: 0.0 standard drinks   Drug use: No     Review of Systems  Constitutional: No fever/chills Eyes: No visual changes. No discharge ENT: No upper respiratory complaints. Cardiovascular: no chest pain. Respiratory: no cough. No SOB. Gastrointestinal: No abdominal pain.  No nausea, no vomiting.  No diarrhea.  No constipation. Genitourinary: Negative for dysuria. No hematuria Musculoskeletal: Left shoulder pain, left-sided neck pain Skin: Negative for rash, abrasions, lacerations, ecchymosis. Neurological: Patient endorses headache but no headache currently.  Reports unilateral left-sided focal weakness but no numbness.  10 System ROS otherwise negative.  ____________________________________________   PHYSICAL EXAM:  VITAL SIGNS: ED Triage Vitals  Enc Vitals Group     BP 05/10/21 1712 136/60     Pulse Rate 05/10/21 1712 88     Resp 05/10/21 1712 16     Temp 05/10/21 1712 98.1 F (36.7 C)     Temp Source 05/10/21 1712 Oral     SpO2 05/10/21 1712 99 %     Weight 05/10/21 1711 103 lb (46.7 kg)     Height 05/10/21 1711 '5\' 3"'  (1.6 m)     Head Circumference --      Peak Flow --      Pain Score 05/10/21 1710 8     Pain Loc --      Pain Edu? --      Excl. in [place]? --      Constitutional: Alert and oriented. Well appearing and in no acute distress. Eyes: Conjunctivae are normal. PERRL. EOMI. Head: Atraumatic. ENT:      Ears:       Nose: No congestion/rhinnorhea.      Mouth/Throat: Mucous membranes are moist.  Neck: No stridor.  No cervical spine tenderness  to palpation. Hematological/Lymphatic/Immunilogical: No cervical lymphadenopathy. Cardiovascular: Normal rate, regular rhythm. Normal S1 and S2.  Good peripheral circulation. Respiratory: Normal respiratory effort without tachypnea or retractions. Lungs CTAB. Good air entry to the bases with no decreased or absent breath sounds. Gastrointestinal: Bowel sounds 4 quadrants. Soft and nontender to palpation. No guarding or rigidity. No palpable masses. No distention. No CVA tenderness. Musculoskeletal: Full range of motion to all extremities. No gross deformities appreciated. Neurologic:  Normal speech and language.  Patient does appear weak on the left side.  Primarily weakness in the left upper extremity though there is some mild weakness in the left lower extremity when compared to the right.  Patient is able to follow cranial nerve testing at this time without significant gross abnormality.  There does appear to be a slight left-sided mouth droop but this is not appreciated through the rest of the left side of the face.  Grip strength is equal with left side being weaker than right.  Pronator drift to the left. Skin:  Skin is warm, dry and intact. No rash noted. Psychiatric: Mood and affect are normal. Speech and behavior are normal. Patient exhibits appropriate insight and judgement.   ____________________________________________   LABS (all labs ordered are listed, but only abnormal results are displayed)  Labs Reviewed  COMPREHENSIVE METABOLIC PANEL - Abnormal; Notable for the following components:      Result Value   Glucose, Bld 110 (*)    Creatinine, Ser 1.34 (*)    Albumin 2.9 (*)    GFR, Estimated 45 (*)    All other components within normal limits  CBC WITH DIFFERENTIAL/PLATELET - Abnormal; Notable for the following components:   RBC 3.35 (*)    Hemoglobin 9.2 (*)    HCT 29.3 (*)    RDW 16.7 (*)    Monocytes Absolute 1.1 (*)    All other components within normal limits   PROTIME-INR   ____________________________________________  EKG   ____________________________________________  RADIOLOGY I personally viewed and evaluated these images as part of my medical decision making, as well as reviewing the written report by the radiologist.  ED Provider Interpretation: Chronic changes CT angio head without evidence of acute infarct.  CT ANGIO HEAD NECK W WO CM  Result Date: 05/10/2021 CLINICAL DATA:  Acute onset  left-sided weakness EXAM: CT ANGIOGRAPHY HEAD AND NECK TECHNIQUE: Multidetector CT imaging of the head and neck was performed using the standard protocol during bolus administration of intravenous contrast. Multiplanar CT image reconstructions and MIPs were obtained to evaluate the vascular anatomy. Carotid stenosis measurements (when applicable) are obtained utilizing NASCET criteria, using the distal internal carotid diameter as the denominator. CONTRAST:  24m OMNIPAQUE IOHEXOL 350 MG/ML SOLN COMPARISON:  04/14/2020 FINDINGS: CT HEAD FINDINGS Brain: There is no mass, hemorrhage or extra-axial collection. The size and configuration of the ventricles and extra-axial CSF spaces are normal. There is hypoattenuation of the periventricular white matter, most commonly indicating chronic ischemic microangiopathy. Skull: The visualized skull base, calvarium and extracranial soft tissues are normal. Sinuses/Orbits: No fluid levels or advanced mucosal thickening of the visualized paranasal sinuses. No mastoid or middle ear effusion. The orbits are normal. CTA NECK FINDINGS SKELETON: There is no bony spinal canal stenosis. No lytic or blastic lesion. OTHER NECK: Multiple subcentimeter thyroid nodules are incidentally noted. UPPER CHEST: Biapical emphysema. AORTIC ARCH: There is no calcific atherosclerosis of the aortic arch. There is no aneurysm, dissection or hemodynamically significant stenosis of the visualized portion of the aorta. Conventional 3 vessel aortic  branching pattern. The visualized proximal subclavian arteries are widely patent. RIGHT CAROTID SYSTEM: No dissection, occlusion or aneurysm. Mild atherosclerotic calcification at the carotid bifurcation without hemodynamically significant stenosis. LEFT CAROTID SYSTEM: Unchanged 50% stenosis of the proximal left ICA VERTEBRAL ARTERIES: Left dominant configuration. Both origins are clearly patent. There is no dissection, occlusion or flow-limiting stenosis to the skull base (V1-V3 segments). CTA HEAD FINDINGS POSTERIOR CIRCULATION: --Vertebral arteries: Normal V4 segments. --Inferior cerebellar arteries: Normal. --Basilar artery: Normal. --Superior cerebellar arteries: Normal. --Posterior cerebral arteries (PCA): Normal. ANTERIOR CIRCULATION: --Intracranial internal carotid arteries: Normal. --Anterior cerebral arteries (ACA): Normal. Both A1 segments are present. Patent anterior communicating artery (a-comm). --Middle cerebral arteries (MCA): Normal. VENOUS SINUSES: As permitted by contrast timing, patent. ANATOMIC VARIANTS: None Review of the MIP images confirms the above findings. IMPRESSION: 1. No emergent large vessel occlusion or high-grade stenosis of the intracranial arteries. 2. 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 3. Subcentimeter incidental thyroid nodules. No follow-up imaging is recommended. Reference: J Am Coll Radiol. 2015 Feb;12(2): 143-50 4. Aortic Atherosclerosis (ICD10-I70.0) and Emphysema (ICD10-J43.9). Electronically Signed   By: KUlyses JarredM.D.   On: 05/10/2021 19:43    ____________________________________________    PROCEDURES  Procedure(s) performed:    Procedures    Medications  iohexol (OMNIPAQUE) 350 MG/ML injection 75 mL (75 mLs Intravenous Contrast Given 05/10/21 1915)     ____________________________________________   INITIAL IMPRESSION / ASSESSMENT AND PLAN / ED COURSE  Pertinent labs & imaging results that were available during my care of the  patient were reviewed by me and considered in my medical decision making (see chart for details).  Review of the [place] CSRS was performed [place] the [place].           Patient's diagnosis is consistent with arm pain.  Patient presents the emergency department and initially somewhat vague regarding to her symptoms as well as changing complaints.  It appears that patient is most primarily concerned about left arm pain.  She did have a COVID shot 24 hours prior to the onset of symptoms.  Labs, imaging of the head and neck are reassuring at this time.  I suspect that some of the pain is in fact derived from her COVID booster  shot.  At this time I will place the patient on a course of prednisone for inflammation control.  Tylenol for additional pain relief.  Patient is stable for discharge at this time..  Patient is given ED precautions to return to the ED for any worsening or new symptoms.     ____________________________________________  FINAL CLINICAL IMPRESSION(S) / ED DIAGNOSES  Final diagnoses:  Left arm pain      NEW MEDICATIONS STARTED DURING THIS VISIT:  ED Discharge Orders          Ordered    predniSONE (DELTASONE) 50 MG tablet  Daily with breakfast        05/10/21 1953                This chart was dictated using voice recognition software/Dragon. Despite best efforts to proofread, errors can occur which can change the meaning. Any change was purely unintentional.    Darletta Moll, PA-C 05/10/21 1953    Lucrezia Starch, MD 05/11/21 [phone]

## 2021-05-10 NOTE — ED Notes (Signed)
Spoke with family. Pt's ride was 19mins away. Pt in waiting RM, under staff watch

## 2021-05-10 NOTE — ED Notes (Signed)
Called [facility] [phone] for transportation back to group home; reports "my brother will pick her up."  Called group home [phone] notified [facility] Noble of patient's return.

## 2021-05-10 NOTE — ED Triage Notes (Signed)
Pt to ED via EMS from group home, ems unsure of what group home states it is off of [address] in [place]. Pt c/o left arm/wrist pain x4 days. Pt denies injury.

## 2021-05-17 ENCOUNTER — Emergency Department
Admission: EM | Admit: 2021-05-17 | Discharge: 2021-05-17 | Disposition: A | Discharge status: Home / Self Care | Payer: Medicare Other | Attending: Emergency Medicine | Admitting: Emergency Medicine

## 2021-05-17 ENCOUNTER — Other Ambulatory Visit: Payer: Self-pay

## 2021-05-17 DIAGNOSIS — Z7982 Long term (current) use of aspirin: Secondary | ICD-10-CM | POA: Diagnosis not present

## 2021-05-17 DIAGNOSIS — F17210 Nicotine dependence, cigarettes, uncomplicated: Secondary | ICD-10-CM | POA: Diagnosis not present

## 2021-05-17 DIAGNOSIS — Z7984 Long term (current) use of oral hypoglycemic drugs: Secondary | ICD-10-CM | POA: Insufficient documentation

## 2021-05-17 DIAGNOSIS — R531 Weakness: Secondary | ICD-10-CM | POA: Diagnosis not present

## 2021-05-17 DIAGNOSIS — I129 Hypertensive chronic kidney disease with stage 1 through stage 4 chronic kidney disease, or unspecified chronic kidney disease: Secondary | ICD-10-CM | POA: Diagnosis not present

## 2021-05-17 DIAGNOSIS — Z79899 Other long term (current) drug therapy: Secondary | ICD-10-CM | POA: Insufficient documentation

## 2021-05-17 DIAGNOSIS — E1122 Type 2 diabetes mellitus with diabetic chronic kidney disease: Secondary | ICD-10-CM | POA: Diagnosis not present

## 2021-05-17 DIAGNOSIS — N1831 Chronic kidney disease, stage 3a: Secondary | ICD-10-CM | POA: Insufficient documentation

## 2021-05-17 DIAGNOSIS — M79602 Pain in left arm: Secondary | ICD-10-CM | POA: Insufficient documentation

## 2021-05-17 NOTE — ED Notes (Signed)
Unable to call report to group home. Numbers do not work and name of group home is not documented in chart.

## 2021-05-17 NOTE — ED Notes (Signed)
Pt spoke with PA and then called brother. Pt wishes to be discharged and her brother is coming to pick her up. Pt does not have legal guardian and is able to discharge with brother. Discharge papers are being prepared.

## 2021-05-17 NOTE — Discharge Instructions (Addendum)
Your exam is normal. Take the steroid as previously prescribed.

## 2021-05-17 NOTE — ED Provider Notes (Signed)
[facility] Emergency Department Provider Note ____________________________________________  Time seen: 1516  I have reviewed the triage vital signs and the nursing notes.  HISTORY  Chief Complaint  Arm Pain  HPI Alicia Rogers is a 60 y.o. female with below medical history, returns to the ED from a group home, with ongoing complaints of left hand pain and weakness.  Patient received COVID vaccine on Friday, 1 day prior to onset of symptoms.  She was evaluated here on Friday, with a work-up that was reassuring including a CTA head which did not reveal any acute occlusive processes.  Patient discharged with a prescription for prednisone, presents today with symptoms unchanged.  Past Medical History:  Diagnosis Date   Depression    Diabetes mellitus without complication ([place])    Diabetes mellitus, type II ([place])    Hypertension    Schizophrenia ([place])     Patient Active Problem List   Diagnosis Date Noted   Hypertensive urgency 02/28/2020   Back pain 02/28/2020   CKD (chronic kidney disease), stage IIIa 02/28/2020   Hypokalemia 02/28/2020   Elevated troponin 02/28/2020   Hematuria 02/28/2020   Tobacco abuse 02/28/2020   Dizziness 02/28/2020   HLD (hyperlipidemia) 02/28/2020   Type II diabetes mellitus with renal manifestations ([place]) 02/28/2020   Compulsive tobacco user syndrome 10/11/2014   BP (high blood pressure) 09/04/2014   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   Claudication ([place]) 06/09/2014   Schizoaffective disorder, depressive type ([place]) 06/09/2014   Diabetes mellitus, type 2 ([place]) 06/09/2014   Type 2 diabetes mellitus ([place]) 06/09/2014   Controlled type 2 diabetes mellitus without complication ([place]) 39/10/90    History reviewed. No pertinent surgical history.  Prior to Admission medications   Medication Sig Start Date End Date Taking? Authorizing Provider  ACCU-CHEK FASTCLIX LANCETS [place]  02/17/15   [provider]  ACCU-CHEK SMARTVIEW test strip  04/02/15   [provider]  acetaminophen (TYLENOL) 325 MG tablet Take 325 mg by mouth every 4 (four) hours as needed. 03/17/21 03/17/22  [provider]  amLODipine (NORVASC) 10 MG tablet Take 1 tablet (10 mg total) by mouth daily. 07/02/20   Carrie Mew, MD  aspirin EC 81 MG EC tablet Take 1 tablet (81 mg total) by mouth daily. Swallow whole. 03/01/20   Ezekiel Slocumb, DO  atorvastatin (LIPITOR) 80 MG tablet Take 0.5 tablets (40 mg total) by mouth daily at 6 PM. 07/02/20   Carrie Mew, MD  Blood Glucose Monitoring Suppl (FIFTY50 GLUCOSE METER 2.0) W/DEVICE KIT Check blood sugar 3 times a day. Dx E11.9 07/04/14   [provider]  hydrochlorothiazide (HYDRODIURIL) 12.5 MG tablet Take 12.5 mg by mouth daily. Patient not taking: Reported on 03/21/2021 12/06/19   [provider]  hydrOXYzine (ATARAX/VISTARIL) 25 MG tablet Take 1 tablet (25 mg total) by mouth 3 (three) times daily as needed for anxiety. Patient not taking: Reported on 03/21/2021 07/02/20   Carrie Mew, MD  Lancet Devices (RELION LANCING DEVICE) [place]  05/09/14   [provider]  lidocaine (LIDODERM) 5 % Place 1 patch onto the skin every 12 (twelve) hours. Remove & Discard patch within 12 hours or as directed by MD 03/21/21 03/21/22  Nance Pear, MD  loperamide (IMODIUM) 2 MG capsule Take 2 mg by mouth daily as needed. 03/17/21   [provider]  losartan (COZAAR) 100 MG tablet Take 1 tablet (100 mg total) by mouth daily. 07/02/20   Carrie Mew, MD  lurasidone (LATUDA) 40 MG TABS tablet Take 1 tablet (40 mg total) by mouth daily with breakfast. 07/02/20   Carrie Mew, MD  metFORMIN (GLUCOPHAGE XR) 500 MG 24 hr tablet Take 2 tablets (1,000 mg total) by mouth daily with breakfast. Patient not taking: Reported on 03/21/2021 07/02/20 07/02/21  Carrie Mew, MD  metFORMIN (GLUCOPHAGE) 500 MG tablet Take 500 mg by mouth 2  (two) times daily. 03/09/21   [provider]  omeprazole (PRILOSEC) 20 MG capsule Take 1 capsule (20 mg total) by mouth daily. Patient not taking: Reported on 03/21/2021 07/02/20 07/02/21  Carrie Mew, MD  ondansetron (ZOFRAN ODT) 4 MG disintegrating tablet Take 1 tablet (4 mg total) by mouth every 6 (six) hours as needed for nausea or vomiting. Patient not taking: Reported on 03/21/2021 08/27/20   Delman Kitten, MD  oxybutynin (DITROPAN XL) 15 MG 24 hr tablet Take 1 tablet (15 mg total) by mouth at bedtime. 07/02/20   Carrie Mew, MD  predniSONE (DELTASONE) 50 MG tablet Take 1 tablet (50 mg total) by mouth daily with breakfast. 05/10/21   Cuthriell, Charline Bills, PA-C  sitaGLIPtin (JANUVIA) 100 MG tablet Take 1 tablet (100 mg total) by mouth daily. 07/02/20   Carrie Mew, MD  traZODone (DESYREL) 50 MG tablet Take 1 tablet (50 mg total) by mouth at bedtime. 07/02/20   Carrie Mew, MD    Allergies Penicillins  Family History  Problem Relation Age of Onset   Diabetes Mother    Glaucoma Mother    Heart disease Mother    Heart attack Mother    Depression Mother    Hypertension Mother    Diabetes Father    Glaucoma Father    Hypertension Father    Diabetes Sister    Depression Sister    Post-traumatic stress disorder Sister     Social History Social History   Tobacco Use   Smoking status: Every Day    Packs/day: 1.00    Types: Cigarettes    Start date: 04/21/1978   Smokeless tobacco: Never  Vaping Use   Vaping Use: Never used  Substance Use Topics   Alcohol use: No    Alcohol/week: 0.0 standard drinks   Drug use: No    Review of Systems  Constitutional: Negative for fever. Eyes: Negative for visual changes. ENT: Negative for sore throat. Cardiovascular: Negative for chest pain. Respiratory: Negative for shortness of breath. Gastrointestinal: Negative for abdominal pain, vomiting and diarrhea. Genitourinary: Negative for dysuria. Musculoskeletal:  Negative for back pain.  Left hand pain as above. Skin: Negative for rash. Neurological: Negative for headaches, focal weakness or numbness. ____________________________________________  PHYSICAL EXAM:  VITAL SIGNS: ED Triage Vitals  Enc Vitals Group     BP 05/17/21 1450 (!) 138/56     Pulse Rate 05/17/21 1450 (!) 54     Resp 05/17/21 1450 18     Temp 05/17/21 1450 97.8 F (36.6 C)     Temp Source 05/17/21 1450 Oral     SpO2 05/17/21 1450 98 %     Weight 05/17/21 1451 101 lb 6.6 oz (46 kg)     Height 05/17/21 1451 '5\' 3"'  (1.6 m)     Head Circumference --      Peak Flow --      Pain Score 05/17/21 1451 8     Pain Loc --      Pain Edu? --      Excl. in [place]? --     Constitutional: Alert and oriented. Well  appearing and in no distress. Head: Normocephalic and atraumatic. Eyes: Conjunctivae are normal. PERRL. Normal extraocular movements Ears: Canals clear. TMs intact bilaterally. Nose: No congestion/rhinorrhea/epistaxis. Mouth/Throat: Mucous membranes are moist. Neck: Supple. No thyromegaly. Hematological/Lymphatic/Immunological: No cervical lymphadenopathy. Cardiovascular: Normal rate, regular rhythm. Normal distal pulses. Respiratory: Normal respiratory effort. No wheezes/rales/rhonchi. Gastrointestinal: Soft and nontender. No distention. Musculoskeletal: Left hand with obvious deformity or dislocation.  Patient able to articulate the hand without difficulty.  Normal composite fist.  She has been left leg telephone without difficulty.  Normal composite fist is noted.  Nontender with normal range of motion in all extremities.  Neurologic:  Normal gait without ataxia. Normal speech and language. No gross focal neurologic deficits are appreciated. Skin:  Skin is warm, dry and intact. No rash noted. Psychiatric: Mood and affect are normal. Patient exhibits appropriate insight and judgment. ____________________________________________    {LABS (pertinent  positives/negatives)  ____________________________________________  {EKG  ____________________________________________   RADIOLOGY Official radiology report(s): No results found. ____________________________________________  PROCEDURES   Procedures ____________________________________________   INITIAL IMPRESSION / ASSESSMENT AND PLAN / ED COURSE  As part of my medical decision making, I reviewed the following data within the electronic MEDICAL RECORD NUMBER Notes from prior ED visits  Patient ED evaluation of ongoing right hand pain and weakness 1 week following a IM injection for flu vaccine.  Patient is with sounding signs of acute respiratory stress, she is afebrile, and no skin changes are appreciated.  She did not endorse any deltoid muscle pain at this time.  She is with a normal exam without signs of paralysis or neuromuscular deficit.  She be discharged to follow-up with primary provider ongoing symptoms.  She continue with the prednisone.   Alicia Rogers was evaluated in Emergency Department on 05/17/2021 for the symptoms described in the history of present illness. She was evaluated in the context of the global COVID-19 pandemic, which necessitated consideration that the patient might be at risk for infection with the SARS-CoV-2 virus that causes COVID-19. Institutional protocols and algorithms that pertain to the evaluation of patients at risk for COVID-19 are in a state of rapid change based on information released by regulatory bodies including the CDC and federal and state organizations. These policies and algorithms were followed during the patient's care in the ED. ____________________________________________  FINAL CLINICAL IMPRESSION(S) / ED DIAGNOSES  Final diagnoses:  Left arm pain      Verlie Liotta, Dannielle Karvonen, PA-C 05/17/21 2310    Duffy Bruce, MD 05/20/21 1455

## 2021-05-17 NOTE — ED Notes (Signed)
First Nurse Note:  Pt to ED via [facility] from home for left arm weakness. Pt seen last week for same. EMS reports that pt had COVID shot in left arm and has had weakness since then, was given medication and told to follow up with her PCP if she was not any better. VSS per EMS. Pt is in NAD.

## 2021-05-17 NOTE — ED Notes (Signed)
Pt to ED for L arm pain, states pain started last week. Triage note states that L arm was painful and weak after covid shot last week. Pt unable to endorse if arm feels weak and unable to describe pain or give much history. Pt is holding arm and hand in position of flexion.  Pt wishes to stay in wheelchair and refuses blanket at this time. Pt was given phone to use before this nurse entered room and pt is holding phone, unable to use it. Asked pt if she needs help to use the phone. Pt very slow to answer and seems confused.

## 2021-05-17 NOTE — ED Triage Notes (Signed)
Pt comes ems from group home with left hand pain/weakness since Friday after covid shot. Was seen here Friday for same and given prednisone. States it is the same as it was Friday.

## 2021-06-08 ENCOUNTER — Emergency Department: Payer: Medicare Other

## 2021-06-08 ENCOUNTER — Emergency Department
Admission: EM | Admit: 2021-06-08 | Discharge: 2021-06-09 | Disposition: A | Discharge status: Home / Self Care | Payer: Medicare Other | Attending: Emergency Medicine | Admitting: Emergency Medicine

## 2021-06-08 ENCOUNTER — Other Ambulatory Visit: Payer: Self-pay

## 2021-06-08 DIAGNOSIS — R5383 Other fatigue: Secondary | ICD-10-CM | POA: Insufficient documentation

## 2021-06-08 DIAGNOSIS — N1831 Chronic kidney disease, stage 3a: Secondary | ICD-10-CM | POA: Insufficient documentation

## 2021-06-08 DIAGNOSIS — E119 Type 2 diabetes mellitus without complications: Secondary | ICD-10-CM

## 2021-06-08 DIAGNOSIS — Z20822 Contact with and (suspected) exposure to covid-19: Secondary | ICD-10-CM | POA: Insufficient documentation

## 2021-06-08 DIAGNOSIS — R5381 Other malaise: Secondary | ICD-10-CM | POA: Diagnosis not present

## 2021-06-08 DIAGNOSIS — Z7982 Long term (current) use of aspirin: Secondary | ICD-10-CM | POA: Insufficient documentation

## 2021-06-08 DIAGNOSIS — F17210 Nicotine dependence, cigarettes, uncomplicated: Secondary | ICD-10-CM | POA: Insufficient documentation

## 2021-06-08 DIAGNOSIS — E1122 Type 2 diabetes mellitus with diabetic chronic kidney disease: Secondary | ICD-10-CM | POA: Insufficient documentation

## 2021-06-08 DIAGNOSIS — I129 Hypertensive chronic kidney disease with stage 1 through stage 4 chronic kidney disease, or unspecified chronic kidney disease: Secondary | ICD-10-CM | POA: Insufficient documentation

## 2021-06-08 DIAGNOSIS — F251 Schizoaffective disorder, depressive type: Secondary | ICD-10-CM | POA: Diagnosis not present

## 2021-06-08 DIAGNOSIS — Z7984 Long term (current) use of oral hypoglycemic drugs: Secondary | ICD-10-CM | POA: Diagnosis not present

## 2021-06-08 DIAGNOSIS — R4182 Altered mental status, unspecified: Secondary | ICD-10-CM | POA: Diagnosis present

## 2021-06-08 DIAGNOSIS — I10 Essential (primary) hypertension: Secondary | ICD-10-CM | POA: Diagnosis present

## 2021-06-08 DIAGNOSIS — F061 Catatonic disorder due to known physiological condition: Secondary | ICD-10-CM | POA: Diagnosis not present

## 2021-06-08 LAB — RESP PANEL BY RT-PCR (FLU A&B, COVID) ARPGX2
Influenza A by PCR: NEGATIVE
Influenza B by PCR: NEGATIVE
SARS Coronavirus 2 by RT PCR: NEGATIVE

## 2021-06-08 LAB — CBC
HCT: 30.9 % — ABNORMAL LOW (ref 36.0–46.0)
Hemoglobin: 9.6 g/dL — ABNORMAL LOW (ref 12.0–15.0)
MCH: 27.7 pg (ref 26.0–34.0)
MCHC: 31.1 g/dL (ref 30.0–36.0)
MCV: 89.3 fL (ref 80.0–100.0)
Platelets: 410 10*3/uL — ABNORMAL HIGH (ref 150–400)
RBC: 3.46 MIL/uL — ABNORMAL LOW (ref 3.87–5.11)
RDW: 17 % — ABNORMAL HIGH (ref 11.5–15.5)
WBC: 8.8 10*3/uL (ref 4.0–10.5)
nRBC: 0 % (ref 0.0–0.2)

## 2021-06-08 LAB — COMPREHENSIVE METABOLIC PANEL WITH GFR
ALT: 23 U/L (ref 0–44)
AST: 22 U/L (ref 15–41)
Albumin: 2.8 g/dL — ABNORMAL LOW (ref 3.5–5.0)
Alkaline Phosphatase: 58 U/L (ref 38–126)
Anion gap: 12 (ref 5–15)
BUN: 18 mg/dL (ref 8–23)
CO2: 25 mmol/L (ref 22–32)
Calcium: 9.4 mg/dL (ref 8.9–10.3)
Chloride: 109 mmol/L (ref 98–111)
Creatinine, Ser: 1.3 mg/dL — ABNORMAL HIGH (ref 0.44–1.00)
GFR, Estimated: 47 mL/min — ABNORMAL LOW
Glucose, Bld: 141 mg/dL — ABNORMAL HIGH (ref 70–99)
Potassium: 4.1 mmol/L (ref 3.5–5.1)
Sodium: 146 mmol/L — ABNORMAL HIGH (ref 135–145)
Total Bilirubin: 0.6 mg/dL (ref 0.3–1.2)
Total Protein: 6.9 g/dL (ref 6.5–8.1)

## 2021-06-08 MED ORDER — ONDANSETRON HCL 4 MG/2ML IJ SOLN: 4.0000 mg | Freq: Once | INTRAMUSCULAR | Status: DC

## 2021-06-08 MED ORDER — DIAZEPAM 5 MG/ML IJ SOLN
5.0000 mg | Freq: Once | INTRAMUSCULAR | Status: AC
  Administered 2021-06-08: 5 mg via INTRAVENOUS
  Filled 2021-06-08: qty 2

## 2021-06-08 MED ORDER — SODIUM CHLORIDE 0.9 % IV BOLUS
1000.0000 mL | Freq: Once | INTRAVENOUS | Status: AC
  Administered 2021-06-08: 1000 mL via INTRAVENOUS

## 2021-06-08 NOTE — ED Provider Notes (Signed)
Patient was evaluated by Dr. 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  Was given Valium with improvement in symptoms.  MRI was ordered to exclude central lesion but I do not see any evidence of acute abnormality.  Current plan is for psychiatric evaluation admission.  The patient has been placed in psychiatric observation due to the need to provide a safe environment for the patient while obtaining psychiatric consultation and evaluation, as well as ongoing medical and medication management to treat the patient's condition.  The patient has not been placed under full IVC at this time.    Alicia Lot, MD 06/08/21 2202

## 2021-06-08 NOTE — ED Notes (Signed)
Patient return from MRI.

## 2021-06-08 NOTE — ED Notes (Signed)
Patient transported to MRI via stretcher.

## 2021-06-08 NOTE — Discharge Instructions (Addendum)
Please continue all your medications.  Please follow-up with your doctors and return here for any further problems.

## 2021-06-08 NOTE — ED Notes (Signed)
Patient is resting quietly with eyes closed. Chest rise and fall observed. Pt is hypertensive, however MD is aware. No needs at this time, no acute distress noted.

## 2021-06-08 NOTE — ED Notes (Addendum)
Report received from Ashley, RN 

## 2021-06-08 NOTE — ED Notes (Signed)
Pt given meal tray.

## 2021-06-08 NOTE — ED Provider Notes (Signed)
[facility] Emergency Department Provider Note  ____________________________________________  Time seen: Approximately 3:13 PM  I have reviewed the triage vital signs and the nursing notes.   HISTORY  Chief Complaint Altered Mental Status  Level 5 Caveat: Portions of the History and Physical including HPI and review of systems are unable to be completely obtained due to patient being a poor historian    HPI Alicia Rogers is a 60 y.o. female with a history of hypertension diabetes and schizophrenia who is brought to the ED by family due to concern for altered mental status.  EMS reports that patient is at her baseline per their prior experiences with her.  Patient denies any pain.  She states that she feels "not right," but denies vomiting diarrhea fevers or body aches.  Denies falls or trauma.    Past Medical History:  Diagnosis Date   Depression    Diabetes mellitus without complication ([place])    Diabetes mellitus, type II ([place])    Hypertension    Schizophrenia ([place])      Patient Active Problem List   Diagnosis Date Noted   Hypertensive urgency 02/28/2020   Back pain 02/28/2020   CKD (chronic kidney disease), stage IIIa 02/28/2020   Hypokalemia 02/28/2020   Elevated troponin 02/28/2020   Hematuria 02/28/2020   Tobacco abuse 02/28/2020   Dizziness 02/28/2020   HLD (hyperlipidemia) 02/28/2020   Type II diabetes mellitus with renal manifestations ([place]) 02/28/2020   Compulsive tobacco user syndrome 10/11/2014   BP (high blood pressure) 09/04/2014   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   Claudication ([place]) 06/09/2014   Schizoaffective disorder, depressive type ([place]) 06/09/2014   Diabetes mellitus, type 2 ([place]) 06/09/2014   Type 2 diabetes mellitus ([place]) 06/09/2014   Controlled type 2 diabetes mellitus without complication ([place]) 53/29/9242     No past surgical history on file.   Prior to Admission medications    Medication Sig Start Date End Date Taking? Authorizing Provider  ACCU-CHEK FASTCLIX LANCETS [place]  02/17/15   [provider]  ACCU-CHEK SMARTVIEW test strip  04/02/15   [provider]  acetaminophen (TYLENOL) 325 MG tablet Take 325 mg by mouth every 4 (four) hours as needed. 03/17/21 03/17/22  [provider]  amLODipine (NORVASC) 10 MG tablet Take 1 tablet (10 mg total) by mouth daily. 07/02/20   Carrie Mew, MD  aspirin EC 81 MG EC tablet Take 1 tablet (81 mg total) by mouth daily. Swallow whole. 03/01/20   Ezekiel Slocumb, DO  atorvastatin (LIPITOR) 80 MG tablet Take 0.5 tablets (40 mg total) by mouth daily at 6 PM. 07/02/20   Carrie Mew, MD  Blood Glucose Monitoring Suppl (FIFTY50 GLUCOSE METER 2.0) W/DEVICE KIT Check blood sugar 3 times a day. Dx E11.9 07/04/14   [provider]  hydrochlorothiazide (HYDRODIURIL) 12.5 MG tablet Take 12.5 mg by mouth daily. Patient not taking: Reported on 03/21/2021 12/06/19   [provider]  hydrOXYzine (ATARAX/VISTARIL) 25 MG tablet Take 1 tablet (25 mg total) by mouth 3 (three) times daily as needed for anxiety. Patient not taking: Reported on 03/21/2021 07/02/20   Carrie Mew, MD  Lancet Devices (RELION LANCING DEVICE) [place]  05/09/14   [provider]  lidocaine (LIDODERM) 5 % Place 1 patch onto the skin every 12 (twelve) hours. Remove & Discard patch within 12 hours or as directed by MD 03/21/21 03/21/22  Nance Pear, MD  loperamide (IMODIUM) 2 MG capsule Take 2 mg by mouth  daily as needed. 03/17/21   [provider]  losartan (COZAAR) 100 MG tablet Take 1 tablet (100 mg total) by mouth daily. 07/02/20   Carrie Mew, MD  lurasidone (LATUDA) 40 MG TABS tablet Take 1 tablet (40 mg total) by mouth daily with breakfast. 07/02/20   Carrie Mew, MD  metFORMIN (GLUCOPHAGE XR) 500 MG 24 hr tablet Take 2 tablets (1,000 mg total) by mouth daily with breakfast. Patient not taking:  Reported on 03/21/2021 07/02/20 07/02/21  Carrie Mew, MD  metFORMIN (GLUCOPHAGE) 500 MG tablet Take 500 mg by mouth 2 (two) times daily. 03/09/21   [provider]  omeprazole (PRILOSEC) 20 MG capsule Take 1 capsule (20 mg total) by mouth daily. Patient not taking: Reported on 03/21/2021 07/02/20 07/02/21  Carrie Mew, MD  ondansetron (ZOFRAN ODT) 4 MG disintegrating tablet Take 1 tablet (4 mg total) by mouth every 6 (six) hours as needed for nausea or vomiting. Patient not taking: Reported on 03/21/2021 08/27/20   Delman Kitten, MD  oxybutynin (DITROPAN XL) 15 MG 24 hr tablet Take 1 tablet (15 mg total) by mouth at bedtime. 07/02/20   Carrie Mew, MD  predniSONE (DELTASONE) 50 MG tablet Take 1 tablet (50 mg total) by mouth daily with breakfast. 05/10/21   Cuthriell, Charline Bills, PA-C  sitaGLIPtin (JANUVIA) 100 MG tablet Take 1 tablet (100 mg total) by mouth daily. 07/02/20   Carrie Mew, MD  traZODone (DESYREL) 50 MG tablet Take 1 tablet (50 mg total) by mouth at bedtime. 07/02/20   Carrie Mew, MD     Allergies Penicillins   Family History  Problem Relation Age of Onset   Diabetes Mother    Glaucoma Mother    Heart disease Mother    Heart attack Mother    Depression Mother    Hypertension Mother    Diabetes Father    Glaucoma Father    Hypertension Father    Diabetes Sister    Depression Sister    Post-traumatic stress disorder Sister     Social History Social History   Tobacco Use   Smoking status: Every Day    Packs/day: 1.00    Types: Cigarettes    Start date: 04/21/1978   Smokeless tobacco: Never  Vaping Use   Vaping Use: Never used  Substance Use Topics   Alcohol use: No    Alcohol/week: 0.0 standard drinks   Drug use: No    Review of Systems  Constitutional:   No fever or chills.  ENT:   No sore throat. No rhinorrhea. Cardiovascular:   No chest pain or syncope. Respiratory:   No dyspnea or cough. Gastrointestinal:   Negative for  abdominal pain, vomiting and diarrhea.  Musculoskeletal:   Negative for focal pain or swelling All other systems reviewed and are negative except as documented above in ROS and HPI.  ____________________________________________   PHYSICAL EXAM:  VITAL SIGNS: ED Triage Vitals  Enc Vitals Group     BP 06/08/21 1037 (!) 197/69     Pulse Rate 06/08/21 1037 68     Resp 06/08/21 1037 18     Temp 06/08/21 1037 97.8 F (36.6 C)     Temp Source 06/08/21 1037 Oral     SpO2 06/08/21 1037 100 %     Weight 06/08/21 1035 101 lb 6.6 oz (46 kg)     Height 06/08/21 1035 '5\' 3"'  (1.6 m)     Head Circumference --      Peak Flow --  Pain Score 06/08/21 1035 0     Pain Loc --      Pain Edu? --      Excl. in [place]? --     Vital signs reviewed, nursing assessments reviewed.   Constitutional:   Alert and oriented. Non-toxic appearance. Eyes:   Conjunctivae are normal. EOMI. PERRL. ENT      Head:   Normocephalic and atraumatic.      Nose:   Normal      Mouth/Throat:   Dry mucous membranes.      Neck:   No meningismus. Full ROM. Hematological/Lymphatic/Immunilogical:   No cervical lymphadenopathy. Cardiovascular:   RRR. Symmetric bilateral radial and DP pulses.  No murmurs. Cap refill less than 2 seconds. Respiratory:   Normal respiratory effort without tachypnea/retractions. Breath sounds are clear and equal bilaterally. No wheezes/rales/rhonchi. Gastrointestinal:   Soft with suprapubic tenderness. Non distended. There is no CVA tenderness.  No rebound, rigidity, or guarding. Genitourinary:   deferred Musculoskeletal:   Normal range of motion in all extremities. No joint effusions.  No lower extremity tenderness.  No edema. Neurologic:   Normal speech and language.  Motor grossly intact. No acute focal neurologic deficits are appreciated.  Skin:    Skin is warm, dry and intact. No rash noted.  No petechiae, purpura, or bullae.  ____________________________________________    LABS  (pertinent positives/negatives) (all labs ordered are listed, but only abnormal results are displayed) Labs Reviewed  COMPREHENSIVE METABOLIC PANEL - Abnormal; Notable for the following components:      Result Value   Sodium 146 (*)    Glucose, Bld 141 (*)    Creatinine, Ser 1.30 (*)    Albumin 2.8 (*)    GFR, Estimated 47 (*)    All other components within normal limits  CBC - Abnormal; Notable for the following components:   RBC 3.46 (*)    Hemoglobin 9.6 (*)    HCT 30.9 (*)    RDW 17.0 (*)    Platelets 410 (*)    All other components within normal limits  RESP PANEL BY RT-PCR (FLU A&B, COVID) ARPGX2  URINALYSIS, ROUTINE W REFLEX MICROSCOPIC   ____________________________________________   EKG  Interpreted by me Sinus bradycardia rate of 56, normal axis and intervals.  Normal QRS ST segments and T waves.  ____________________________________________    RADIOLOGY  CT Head Wo Contrast  Result Date: 06/08/2021 CLINICAL DATA:  60 year old female with history of pole turned mental status. EXAM: CT HEAD WITHOUT CONTRAST TECHNIQUE: Contiguous axial images were obtained from the base of the skull through the vertex without intravenous contrast. COMPARISON:  Head CT 05/10/2021. FINDINGS: Brain: Patchy areas of decreased attenuation are noted throughout the deep and periventricular white matter of the cerebral hemispheres bilaterally, compatible with mild chronic microvascular ischemic disease. No evidence of acute infarction, hemorrhage, hydrocephalus, extra-axial collection or mass lesion/mass effect. Vascular: No hyperdense vessel or unexpected calcification. Skull: Normal. Negative for fracture or focal lesion. Sinuses/Orbits: No acute finding. Other: None. IMPRESSION: 1. No acute intracranial abnormalities. 2. Mild chronic microvascular ischemic changes in the cerebral white matter, as above. Electronically Signed   By: Vinnie Langton M.D.   On: 06/08/2021 14:33     ____________________________________________   PROCEDURES Procedures  ____________________________________________  DIFFERENTIAL DIAGNOSIS   Intracranial hemorrhage, dehydration, electrolyte abnormality, viral illness, UTI  CLINICAL IMPRESSION / ASSESSMENT AND PLAN / ED COURSE  Medications ordered in the ED: Medications  ondansetron (ZOFRAN) injection 4 mg (has no administration in time range)  sodium chloride 0.9 % bolus 1,000 mL (has no administration in time range)    Pertinent labs & imaging results that were available during my care of the patient were reviewed by me and considered in my medical decision making (see chart for details).  Alicia Rogers was evaluated in Emergency Department on 06/08/2021 for the symptoms described in the history of present illness. She was evaluated in the context of the global COVID-19 pandemic, which necessitated consideration that the patient might be at risk for infection with the SARS-CoV-2 virus that causes COVID-19. Institutional protocols and algorithms that pertain to the evaluation of patients at risk for COVID-19 are in a state of rapid change based on information released by regulatory bodies including the CDC and federal and state organizations. These policies and algorithms were followed during the patient's care in the ED.   Patient brought to ED with nonspecific complaints of feeling unwell.  Exam suggestive of UTI.  CT head obtained which is unremarkable.  Serum labs unremarkable, no leukocytosis.  Chronic mild anemia, CKD at baseline.  Vital signs unremarkable.  Overall exam is benign.   Considering the patient's symptoms, medical history, and physical examination today, I have low suspicion for cholecystitis or biliary pathology, pancreatitis, perforation or bowel obstruction, hernia, intra-abdominal abscess, AAA or dissection, volvulus or intussusception, mesenteric ischemia, or appendicitis.   Current differential  includes cystitis, flu/COVID, physiologic baseline, or increase of schizophrenia symptoms.  Given the patient's being signed out to Dr. Quentin Cornwall at 3:00 PM.      ____________________________________________   FINAL CLINICAL IMPRESSION(S) / ED DIAGNOSES    Final diagnoses:  Malaise and fatigue     ED Discharge Orders     None       Portions of this note were generated with dragon dictation software. Dictation errors may occur despite best attempts at proofreading.    Carrie Mew, MD 06/08/21 1517

## 2021-06-08 NOTE — Consult Note (Signed)
[facility] Face-to-Face Psychiatry Consult   Reason for Consult: Consult for 60 year old woman with a past history of schizophrenia brought to the emergency room because of reports that she was not herself Referring Physician: Quentin Cornwall Patient Identification: Alicia Rogers MRN:  [id number] Principal Diagnosis: Catatonia Diagnosis:  Principal Problem:   Catatonia Active Problems:   BP (high blood pressure)   Schizoaffective disorder, depressive type ([place])   Diabetes mellitus, type 2 ([place])   Total Time spent with patient: 1 hour  Subjective:   Alicia Rogers is a 60 y.o. female patient admitted with "I do not know".  HPI: Patient seen chart reviewed.  60 year old woman with a history of schizophrenia as well as multiple medical problems was brought in by EMS after they were called with reports that the patient was having an altered mental status.  Reports of specific complaints are vague.  There is a mention in the chart that EMTs thought the patient appeared to be at what they believed was her baseline.  On interview with the patient however found her awake but with clearly altered mental status.  In response to questioning she mutters very quietly.  Most of what she says cannot be heard.  She is able to answer yes or no or give a couple of brief answers to some questions.  She does not voice any specific complaint.  When asked to move her hands or arms she is able to do so only very slightly and is holding her left arm in an odd position.  I am able however to move her upper extremities myself and it does not cause her any pain.  With coaxing she was eventually able to make a few more hand gestures.  Upper extremities appear to have "waxy flexibility" in terms of having normal tone but no volitional movement.  Patient was not able to move lower extremities at all and request to when requested.  I have attempted to call her living facility and have not been able to get through at this point.   Work-up so far unrevealing.  Sugar only slightly elevated probably in her normal range.  Several other abnormalities but nothing really striking given her multiple medical problems.  Head CT shows typical white matter changes often found in patients with diabetes and hypertension but no acute change. Past Psychiatric History: Patient has a past history of schizophrenia.  Multiple medicines have been tried.  Chronic hallucinations often noted.  Unknown if she has ever had an admission but nothing in the past several years.  No known violence or suicidal behavior.  Some mention of alcohol use on 1 occasion but no documented history of serious substance problems  Risk to Self:   Risk to Others:   Prior Inpatient Therapy:   Prior Outpatient Therapy:    Past Medical History:  Past Medical History:  Diagnosis Date   Depression    Diabetes mellitus without complication ([place])    Diabetes mellitus, type II ([place])    Hypertension    Schizophrenia ([place])    No past surgical history on file. Family History:  Family History  Problem Relation Age of Onset   Diabetes Mother    Glaucoma Mother    Heart disease Mother    Heart attack Mother    Depression Mother    Hypertension Mother    Diabetes Father    Glaucoma Father    Hypertension Father    Diabetes Sister    Depression Sister    Post-traumatic stress  disorder Sister    Family Psychiatric  History: Depression and PTSD are listed in close family members Social History:  Social History   Substance and Sexual Activity  Alcohol Use No   Alcohol/week: 0.0 standard drinks     Social History   Substance and Sexual Activity  Drug Use No    Social History   Socioeconomic History   Marital status: Single    Spouse name: Not on file   Number of children: Not on file   Years of education: Not on file   Highest education level: Not on file  Occupational History   Not on file  Tobacco Use   Smoking status: Every Day    Packs/day: 1.00     Types: Cigarettes    Start date: 04/21/1978   Smokeless tobacco: Never  Vaping Use   Vaping Use: Never used  Substance and Sexual Activity   Alcohol use: No    Alcohol/week: 0.0 standard drinks   Drug use: No   Sexual activity: Never  Other Topics Concern   Not on file  Social History Narrative   Not on file   Social Determinants of Health   Financial Resource Strain: Not on file  Food Insecurity: Not on file  Transportation Needs: Not on file  Physical Activity: Not on file  Stress: Not on file  Social Connections: Not on file   Additional Social History:    Allergies:   Allergies  Allergen Reactions   Penicillins     Rash    Labs:  Results for orders placed or performed during the hospital encounter of 06/08/21 (from the past 48 hour(s))  Comprehensive metabolic panel     Status: Abnormal   Collection Time: 06/08/21 10:36 AM  Result Value Ref Range   Sodium 146 (H) 135 - 145 mmol/L   Potassium 4.1 3.5 - 5.1 mmol/L   Chloride 109 98 - 111 mmol/L   CO2 25 22 - 32 mmol/L   Glucose, Bld 141 (H) 70 - 99 mg/dL    Comment: Glucose reference range applies only to samples taken after fasting for at least 8 hours.   BUN 18 8 - 23 mg/dL   Creatinine, Ser 1.30 (H) 0.44 - 1.00 mg/dL   Calcium 9.4 8.9 - 10.3 mg/dL   Total Protein 6.9 6.5 - 8.1 g/dL   Albumin 2.8 (L) 3.5 - 5.0 g/dL   AST 22 15 - 41 U/L   ALT 23 0 - 44 U/L   Alkaline Phosphatase 58 38 - 126 U/L   Total Bilirubin 0.6 0.3 - 1.2 mg/dL   GFR, Estimated 47 (L) >60 mL/min    Comment: (NOTE) Calculated using the CKD-EPI Creatinine Equation (2021)    Anion gap 12 5 - 15    Comment: Performed at [facility], [address] [place], [place] [address]  CBC     Status: Abnormal   Collection Time: 06/08/21 10:36 AM  Result Value Ref Range   WBC 8.8 4.0 - 10.5 K/uL   RBC 3.46 (L) 3.87 - 5.11 MIL/uL   Hemoglobin 9.6 (L) 12.0 - 15.0 g/dL   HCT 30.9 (L) 36.0 - 46.0 %   MCV 89.3 80.0 - 100.0 fL    MCH 27.7 26.0 - 34.0 pg   MCHC 31.1 30.0 - 36.0 g/dL   RDW 17.0 (H) 11.5 - 15.5 %   Platelets 410 (H) 150 - 400 K/uL   nRBC 0.0 0.0 - 0.2 %    Comment: Performed  at [facility], [address] [place], [place] [address]  Resp Panel by RT-PCR (Flu A&B, Covid) Nasopharyngeal Swab     Status: None   Collection Time: 06/08/21  3:03 PM   Specimen: Nasopharyngeal Swab; Nasopharyngeal(NP) swabs in vial transport medium  Result Value Ref Range   SARS Coronavirus 2 by RT PCR NEGATIVE NEGATIVE    Comment: (NOTE) SARS-CoV-2 target nucleic acids are NOT DETECTED.  The SARS-CoV-2 RNA is generally detectable in upper respiratory specimens during the acute phase of infection. The lowest concentration of SARS-CoV-2 viral copies this assay can detect is 138 copies/mL. A negative result does not preclude SARS-Cov-2 infection and should not be used as the sole basis for treatment or other patient management decisions. A negative result may occur with  improper specimen collection/handling, submission of specimen other than nasopharyngeal swab, presence of viral mutation(s) within the areas targeted by this assay, and inadequate number of viral copies(<138 copies/mL). A negative result must be combined with clinical observations, patient history, and epidemiological information. The expected result is Negative.  Fact Sheet for Patients:  EntrepreneurPulse.com.au  Fact Sheet for Healthcare Providers:  IncredibleEmployment.be  This test is no t yet approved or cleared by the [place] FDA and  has been authorized for detection and/or diagnosis of SARS-CoV-2 by FDA under an Emergency Use Authorization (EUA). This EUA will remain  in effect (meaning this test can be used) for the duration of the COVID-19 declaration under Section 564(b)(1) of the Act, 21 U.S.C.section 360bbb-3(b)(1), unless the authorization is terminated  or revoked sooner.        Influenza A by PCR NEGATIVE NEGATIVE   Influenza B by PCR NEGATIVE NEGATIVE    Comment: (NOTE) The Xpert Xpress SARS-CoV-2/FLU/RSV plus assay is intended as an aid in the diagnosis of influenza from Nasopharyngeal swab specimens and should not be used as a sole basis for treatment. Nasal washings and aspirates are unacceptable for Xpert Xpress SARS-CoV-2/FLU/RSV testing.  Fact Sheet for Patients: EntrepreneurPulse.com.au  Fact Sheet for Healthcare Providers: IncredibleEmployment.be  This test is not yet approved or cleared by the [place] FDA and has been authorized for detection and/or diagnosis of SARS-CoV-2 by FDA under an Emergency Use Authorization (EUA). This EUA will remain in effect (meaning this test can be used) for the duration of the COVID-19 declaration under Section 564(b)(1) of the Act, 21 U.S.C. section 360bbb-3(b)(1), unless the authorization is terminated or revoked.  Performed at [facility], [address] [place], [place] [address]     Current Facility-Administered Medications  Medication Dose Route Frequency Provider Last Rate Last Admin   ondansetron [facility]) injection 4 mg  4 mg Intravenous Once Carrie Mew, MD       Current Outpatient Medications  Medication Sig Dispense Refill   ACCU-CHEK FASTCLIX LANCETS MISC      ACCU-CHEK SMARTVIEW test strip      acetaminophen (TYLENOL) 325 MG tablet Take 325 mg by mouth every 4 (four) hours as needed.     amLODipine (NORVASC) 10 MG tablet Take 1 tablet (10 mg total) by mouth daily. 30 tablet 1   aspirin EC 81 MG EC tablet Take 1 tablet (81 mg total) by mouth daily. Swallow whole. 30 tablet 1   atorvastatin (LIPITOR) 80 MG tablet Take 0.5 tablets (40 mg total) by mouth daily at 6 PM. 30 tablet 1   Blood Glucose Monitoring Suppl (FIFTY50 GLUCOSE METER 2.0) W/DEVICE KIT Check blood sugar 3 times a day. Dx E11.9  hydrochlorothiazide (HYDRODIURIL) 12.5 MG  tablet Take 12.5 mg by mouth daily. (Patient not taking: Reported on 03/21/2021)     hydrOXYzine (ATARAX/VISTARIL) 25 MG tablet Take 1 tablet (25 mg total) by mouth 3 (three) times daily as needed for anxiety. (Patient not taking: Reported on 03/21/2021) 30 tablet 0   Lancet Devices (RELION LANCING DEVICE) MISC      lidocaine (LIDODERM) 5 % Place 1 patch onto the skin every 12 (twelve) hours. Remove & Discard patch within 12 hours or as directed by MD 10 patch 0   loperamide (IMODIUM) 2 MG capsule Take 2 mg by mouth daily as needed.     losartan (COZAAR) 100 MG tablet Take 1 tablet (100 mg total) by mouth daily. 30 tablet 1   lurasidone (LATUDA) 40 MG TABS tablet Take 1 tablet (40 mg total) by mouth daily with breakfast. 30 tablet 0   metFORMIN (GLUCOPHAGE XR) 500 MG 24 hr tablet Take 2 tablets (1,000 mg total) by mouth daily with breakfast. (Patient not taking: Reported on 03/21/2021) 60 tablet 1   metFORMIN (GLUCOPHAGE) 500 MG tablet Take 500 mg by mouth 2 (two) times daily.     omeprazole (PRILOSEC) 20 MG capsule Take 1 capsule (20 mg total) by mouth daily. (Patient not taking: Reported on 03/21/2021) 30 capsule 1   ondansetron (ZOFRAN ODT) 4 MG disintegrating tablet Take 1 tablet (4 mg total) by mouth every 6 (six) hours as needed for nausea or vomiting. (Patient not taking: Reported on 03/21/2021) 20 tablet 0   oxybutynin (DITROPAN XL) 15 MG 24 hr tablet Take 1 tablet (15 mg total) by mouth at bedtime. 30 tablet 1   predniSONE (DELTASONE) 50 MG tablet Take 1 tablet (50 mg total) by mouth daily with breakfast. 5 tablet 0   sitaGLIPtin (JANUVIA) 100 MG tablet Take 1 tablet (100 mg total) by mouth daily. 30 tablet 1   traZODone (DESYREL) 50 MG tablet Take 1 tablet (50 mg total) by mouth at bedtime. 30 tablet 1    Musculoskeletal: Strength & Muscle Tone: within normal limits Gait & Station: unable to stand Patient leans: N/A            Psychiatric Specialty Exam:  Presentation  General  Appearance: No data recorded Eye Contact:No data recorded Speech:No data recorded Speech Volume:No data recorded Handedness:No data recorded  Mood and Affect  Mood:No data recorded Affect:No data recorded  Thought Process  Thought Processes:No data recorded Descriptions of Associations:No data recorded Orientation:No data recorded Thought Content:No data recorded History of Schizophrenia/Schizoaffective disorder:No data recorded Duration of Psychotic Symptoms:No data recorded Hallucinations:No data recorded Ideas of Reference:No data recorded Suicidal Thoughts:No data recorded Homicidal Thoughts:No data recorded  Sensorium  Memory:No data recorded Judgment:No data recorded Insight:No data recorded  Executive Functions  Concentration:No data recorded Attention Span:No data recorded Recall:No data recorded Fund of Knowledge:No data recorded Language:No data recorded  Psychomotor Activity  Psychomotor Activity:No data recorded  Assets  Assets:No data recorded  Sleep  Sleep:No data recorded  Physical Exam: Physical Exam Vitals and nursing note reviewed.  Constitutional:      Appearance: She is ill-appearing.  HENT:     Head: Normocephalic and atraumatic.     Mouth/Throat:     Pharynx: Oropharynx is clear.  Eyes:     Pupils: Pupils are equal, round, and reactive to light.  Cardiovascular:     Rate and Rhythm: Normal rate and regular rhythm.  Pulmonary:     Effort: Pulmonary effort is normal.  Breath sounds: Normal breath sounds.  Abdominal:     General: Abdomen is flat.     Palpations: Abdomen is soft.  Musculoskeletal:        General: Normal range of motion.  Skin:    General: Skin is warm and dry.  Neurological:     General: No focal deficit present.     Mental Status: Mental status is at baseline.     Comments: Patient appears to be unable to participate with most aspects of the normal neurologic exam.  She is unable to voluntarily move her lower  extremities.  She moves her upper extremities only very slightly in response to request.  However she continues to have appropriate muscle tone and when her arms are lifted up she is able to support them.  Psychiatric:        Mood and Affect: Affect is blunt.        Speech: She is noncommunicative. Speech is delayed.        Behavior: Behavior is slowed.   Review of Systems  Unable to perform ROS: Psychiatric disorder  Blood pressure (!) 176/57, pulse (!) 48, temperature 97.8 F (36.6 C), temperature source Oral, resp. rate 13, height '5\' 3"'  (1.6 m), weight 46 kg, SpO2 100 %. Body mass index is 17.96 kg/m.  Treatment Plan Summary: Medication management and Plan Case reviewed with emergency room physician.  To my examination her current presentation seems most consistent with catatonia.  We agreed however that certainly especially with her medical problems it is appropriate to do a full work-up.  MRI is pending for further investigation of brain lesions.  Meanwhile I have ordered a one-time injection of Valium to see if that may make a difference in her presentation if this is catatonia.  If no other medical etiology is discovered and she continues in her current state she may be a candidate for geriatric psychiatry admission or even ECT.  We will continue to follow up.  Disposition:  See note  Alethia Berthold, MD 06/08/2021 5:31 PM

## 2021-06-08 NOTE — ED Notes (Addendum)
Assisted pt to the bathroom. Sheets changed and chuck pad placed on bed. Patient provided mesh panties and paper scrub pants. Soiled clothing placed in a belongings bag. Warm blanket provided and non-skid socks placed on patient

## 2021-06-08 NOTE — ED Triage Notes (Signed)
Pt to ED [facility] from home/staying with family member. Per EMS pt is altered at baseline, family is unsure of baseline and sent for AMS. NAD noted Pt oriented to person, place and time. Denies falls, pain.

## 2021-06-08 NOTE — ED Notes (Signed)
Psychiatry team at bedside

## 2021-06-09 MED ORDER — LURASIDONE HCL 40 MG PO TABS
40.0000 mg | ORAL_TABLET | Freq: Every day | ORAL | Status: DC
  Filled 2021-06-09: qty 1

## 2021-06-09 MED ORDER — ASPIRIN EC 81 MG PO TBEC
81.0000 mg | DELAYED_RELEASE_TABLET | Freq: Every day | ORAL | Status: DC
  Filled 2021-06-09: qty 1

## 2021-06-09 MED ORDER — AMLODIPINE BESYLATE 5 MG PO TABS
10.0000 mg | ORAL_TABLET | Freq: Every day | ORAL | Status: DC
  Administered 2021-06-09: 10 mg via ORAL
  Filled 2021-06-09 (×2): qty 2

## 2021-06-09 MED ORDER — OXYBUTYNIN CHLORIDE ER 5 MG PO TB24
15.0000 mg | ORAL_TABLET | Freq: Every day | ORAL | Status: DC
  Administered 2021-06-09: 15 mg via ORAL
  Filled 2021-06-09 (×2): qty 1

## 2021-06-09 MED ORDER — METFORMIN HCL 500 MG PO TABS
500.0000 mg | ORAL_TABLET | Freq: Two times a day (BID) | ORAL | Status: DC
  Administered 2021-06-09: 500 mg via ORAL
  Filled 2021-06-09 (×2): qty 1

## 2021-06-09 MED ORDER — LABETALOL HCL 5 MG/ML IV SOLN
10.0000 mg | Freq: Once | INTRAVENOUS | Status: AC
  Administered 2021-06-09: 10 mg via INTRAVENOUS
  Filled 2021-06-09: qty 4

## 2021-06-09 MED ORDER — LINAGLIPTIN 5 MG PO TABS
5.0000 mg | ORAL_TABLET | Freq: Every day | ORAL | Status: DC
  Filled 2021-06-09 (×2): qty 1

## 2021-06-09 MED ORDER — ATORVASTATIN CALCIUM 20 MG PO TABS: 40.0000 mg | ORAL_TABLET | Freq: Every day | ORAL | Status: DC

## 2021-06-09 MED ORDER — TRAZODONE HCL 50 MG PO TABS
50.0000 mg | ORAL_TABLET | Freq: Every day | ORAL | Status: DC
  Administered 2021-06-09: 50 mg via ORAL
  Filled 2021-06-09: qty 1

## 2021-06-09 MED ORDER — LOSARTAN POTASSIUM 50 MG PO TABS
100.0000 mg | ORAL_TABLET | Freq: Every day | ORAL | Status: DC
  Administered 2021-06-09: 100 mg via ORAL
  Filled 2021-06-09 (×2): qty 2

## 2021-06-09 NOTE — ED Provider Notes (Addendum)
Patient seen by Dr. Weber Cooks.  He feels she is okay to go home.  She is interacting with [place] now.  She has now taken her blood pressure medicine and her blood pressure is beginning to come down.  We will let her go home and follow-up as needed.  She does have a safe place to go.  Pressure now 891 systolic.   Nena Polio, MD 06/09/21 1714 ----------------------------------------- 6:02 PM on 06/09/2021 ----------------------------------------- Nurse has been calling family.  Family apparently cannot take care of the patient at home anymore.  We will try to get placement for her.  I have consulted transition of care.   Nena Polio, MD 06/09/21 [phone]

## 2021-06-09 NOTE — ED Notes (Addendum)
Pt encouraged to take her BP medication. Pt refusing. Dr. Charna Archer aware.

## 2021-06-09 NOTE — ED Notes (Signed)
Pt given meal tray.

## 2021-06-09 NOTE — BH Assessment (Addendum)
Comprehensive Clinical Assessment (CCA) Note  06/09/2021 Alicia Rogers [id number]  Alicia Rogers, 60 year olfemale who presents to [facility] ED involuntarily for treatment. Per triage note, Pt to ED [facility] from home/staying with family member. Per EMS pt is altered at baseline, family is unsure of baseline and sent for AMS. NAD noted. Pt oriented to person, place and time. Denies falls, pain.   During TTS assessment pt presents alert and oriented x 3, anxious but cooperative, and mood-congruent with affect. The pt does not appear to be responding to internal or external stimuli. Neither is the pt presenting with any delusional thinking.    It was difficult to assess patient. Patient was awake but with altered mental status. When asked questions, patient would speak softly and mutter her words. Patient was unable to explain why she was brought to the hospital. Patient was able to state that she was living at a care center. Patient did not have movement of her lower extremities and had very little ability to move her arms and hands. Patient has past history of schizophrenia and unknown reports of INPT history.    Per Dr. Weber Cooks pt is recommended for inpatient geri psychiatric admission.     Chief Complaint:  Chief Complaint  Patient presents with   Altered Mental Status   Visit Diagnosis: Catonia    CCA Screening, Triage and Referral (STR)  Patient Reported Information How did you hear about [place]? Family/Friend  Referral name: No data recorded Referral phone number: No data recorded  Whom do you see for routine medical problems? No data recorded Practice/Facility Name: No data recorded Practice/Facility Phone Number: No data recorded Name of Contact: No data recorded Contact Number: No data recorded Contact Fax Number: No data recorded Prescriber Name: No data recorded Prescriber Address (if known): No data recorded  What Is the Reason for Your Visit/Call Today? Patient was  brought to ED by EMS. Family members unsure of patient baseline.  How Long Has This Been Causing You Problems? <Week  What Do You Feel Would Help You the Most Today? -- (Assessment)   Have You Recently Been in Any Inpatient Treatment (Hospital/Detox/Crisis Center/28-Day Program)? No data recorded Name/Location of Program/Hospital:No data recorded How Long Were You There? No data recorded When Were You Discharged? No data recorded  Have You Ever Received Services From [facility] Before? No data recorded Who Do You See at [facility]? No data recorded  Have You Recently Had Any Thoughts About Hurting Yourself? No data recorded Are You Planning to Commit Suicide/Harm Yourself At This time? No data recorded  Have you Recently Had Thoughts About [place]? No data recorded Explanation: No data recorded  Have You Used Any Alcohol or Drugs in the Past 24 Hours? No data recorded How Long Ago Did You Use Drugs or Alcohol? No data recorded What Did You Use and How Much? No data recorded  Do You Currently Have a Therapist/Psychiatrist? No data recorded Name of Therapist/Psychiatrist: No data recorded  Have You Been Recently Discharged From Any Office Practice or Programs? No data recorded Explanation of Discharge From Practice/Program: No data recorded    CCA Screening Triage Referral Assessment Type of Contact: Face-to-Face  Is this Initial or Reassessment? No data recorded Date Telepsych consult ordered in [facility]:  06/09/20  Time Telepsych consult ordered in [facility]:  0153   Patient Reported Information Reviewed? No data recorded Patient Left Without Being Seen? No data recorded Reason for Not Completing Assessment: No data recorded  Collateral Involvement: None provided   Does Patient Have a Stage manager Guardian? No data recorded Name and Contact of Legal Guardian: self  If Minor and Not Living with Parent(s), Who has Custody? n/a  Is CPS involved or ever  been involved? -- (None reported )  Is APS involved or ever been involved? -- (None reported )   Patient Determined To Be At Risk for Harm To Self or Others Based on Review of Patient Reported Information or Presenting Complaint? No data recorded Method: No data recorded Availability of Means: No data recorded Intent: No data recorded Notification Required: No data recorded Additional Information for Danger to Others Potential: No data recorded Additional Comments for Danger to Others Potential: No data recorded Are There Guns or Other Weapons in Your Home? No data recorded Types of Guns/Weapons: No data recorded Are These Weapons Safely Secured?                            No data recorded Who Could Verify You Are Able To Have These Secured: No data recorded Do You Have any Outstanding Charges, Pending Court Dates, Parole/Probation? No data recorded Contacted To Inform of Risk of Harm To Self or Others: No data recorded  Location of Assessment: [facility] ED   Does Patient Present under Involuntary Commitment? No  IVC Papers Initial File Date: No data recorded  [place] of Residence: [place]   Patient Currently Receiving the Following Services: Medication Management   Determination of Need: Urgent (48 hours)   Options For Referral: ED Visit; Inpatient Hospitalization; Geropsychiatric Facility     CCA Biopsychosocial Intake/Chief Complaint:  No data recorded Current Symptoms/Problems: No data recorded  Patient Reported Schizophrenia/Schizoaffective Diagnosis in Past: No data recorded  Strengths: No data recorded Preferences: No data recorded Abilities: No data recorded  Type of Services Patient Feels are Needed: No data recorded  Initial Clinical Notes/Concerns: No data recorded  Mental Health Symptoms Depression:  No data recorded  Duration of Depressive symptoms: No data recorded  Mania:  No data recorded  Anxiety:   No data recorded  Psychosis:  No data recorded   Duration of Psychotic symptoms: No data recorded  Trauma:  No data recorded  Obsessions:  No data recorded  Compulsions:  No data recorded  Inattention:  No data recorded  Hyperactivity/Impulsivity:  No data recorded  Oppositional/Defiant Behaviors:  No data recorded  Emotional Irregularity:  No data recorded  Other Mood/Personality Symptoms:  No data recorded   Mental Status Exam Appearance and self-care  Stature:   Small   Weight:   Underweight   Clothing:   Neat/clean   Grooming:   Normal   Cosmetic use:   None   Posture/gait:   Rigid   Motor activity:   Not Remarkable   Sensorium  Attention:   Unaware   Concentration:  No data recorded  Orientation:  No data recorded  Recall/memory:  No data recorded  Affect and Mood  Affect:  No data recorded  Mood:  No data recorded  Relating  Eye contact:   Staring   Facial expression:   Responsive   Attitude toward examiner:   Cooperative   Thought and Language  Speech flow:  Soft   Thought content:  No data recorded  Preoccupation:  No data recorded  Hallucinations:  No data recorded  Organization:  No data recorded  [employer] of Knowledge:   Average   Intelligence:  Average   Abstraction:  No data recorded  Judgement:  No data recorded  Reality Testing:  No data recorded  Insight:  No data recorded  Decision Making:   Paralyzed   Social Functioning  Social Maturity:  No data recorded  Social Judgement:  No data recorded  Stress  Stressors:  No data recorded  Coping Ability:  No data recorded  Skill Deficits:   Communication; Activities of daily living   Supports:   Family     Religion:    Leisure/Recreation:    Exercise/Diet:     CCA Employment/Education Employment/Work Situation:    Education:     CCA Family/Childhood History Family and Relationship History:    Childhood History:     Child/Adolescent Assessment:     CCA Substance  Use Alcohol/Drug Use: Alcohol / Drug Use Pain Medications: see mar Prescriptions: see mar Over the Counter: see mar History of alcohol / drug use?: No history of alcohol / drug abuse Longest period of sobriety (when/how long): unknown                         ASAM's:  Six Dimensions of Multidimensional Assessment  Dimension 1:  Acute Intoxication and/or Withdrawal Potential:      Dimension 2:  Biomedical Conditions and Complications:      Dimension 3:  Emotional, Behavioral, or Cognitive Conditions and Complications:     Dimension 4:  Readiness to Change:     Dimension 5:  Relapse, Continued use, or Continued Problem Potential:     Dimension 6:  Recovery/Living Environment:     ASAM Severity Score:    ASAM Recommended Level of Treatment:     Substance use Disorder (SUD)    Recommendations for Services/Supports/Treatments:    DSM5 Diagnoses: Patient Active Problem List   Diagnosis Date Noted   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   Hypertensive urgency 02/28/2020   Back pain 02/28/2020   CKD (chronic kidney disease), stage IIIa 02/28/2020   Hypokalemia 02/28/2020   Elevated troponin 02/28/2020   Hematuria 02/28/2020   Tobacco abuse 02/28/2020   Dizziness 02/28/2020   HLD (hyperlipidemia) 02/28/2020   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   Compulsive tobacco user syndrome 10/11/2014   BP (high blood pressure) 09/04/2014   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   Claudication ([place]) 06/09/2014   Schizoaffective disorder, depressive type ([place]) 06/09/2014   Diabetes mellitus, type 2 ([place]) 06/09/2014   Type 2 diabetes mellitus ([place]) 06/09/2014   Controlled type 2 diabetes mellitus without complication ([place]) 10/93/2355    Patient Centered Plan: Patient is on the following Treatment Plan(s):     Referrals to Alternative Service(s): Referred to Alternative Service(s):   Place:   Date:   Time:    Referred to Alternative  Service(s):   Place:   Date:   Time:    Referred to Alternative Service(s):   Place:   Date:   Time:    Referred to Alternative Service(s):   Place:   Date:   Time:     Jatavious Peppard Glennon Mac, Counselor, LCAS-A

## 2021-06-09 NOTE — ED Notes (Signed)
Patient provided meal tray.

## 2021-06-09 NOTE — ED Notes (Signed)
Updated pt on discharge status. Pt continues to ask to use the phone. Pt's nephew called and left a number to call back. Attempted to call back, however, no answer and voicemail box has not been set up yet. Will try again shortly. NAD noted.

## 2021-06-09 NOTE — Consult Note (Signed)
[facility] [place] [facility] Consult   Reason for Consult: Follow-up consult 60 year old woman with a history of schizoaffective disorder Referring Physician: Cinda Quest Patient Identification: Alicia Rogers MRN:  [id number] Principal Diagnosis: Catatonia Diagnosis:  Principal Problem:   Catatonia Active Problems:   BP (high blood pressure)   Schizoaffective disorder, depressive type ([place])   Diabetes mellitus, type 2 ([place])   Total Time spent with patient: 30 minutes  Subjective:   Alicia Rogers is a 60 y.o. female patient admitted with "I am feeling better".  HPI: Patient seen twice today once in the late morning and once in the evening.  Throughout the day patient's condition has improved.  She has been able to get up and ambulate to the toilet.  She is now feeding herself and able to use the telephone.  Patient has minimal memory of how she was doing earlier but says she is feeling better.  Denies any current suicidal or homicidal thoughts denies any hallucinations.  Able to follow basic commands and not showing any acute signs of catatonia  Past Psychiatric History: Past history of chronic schizoaffective disorder  Risk to Self:   Risk to Others:   Prior Inpatient Therapy:   Prior Outpatient Therapy:    Past Medical History:  Past Medical History:  Diagnosis Date   Depression    Diabetes mellitus without complication ([place])    Diabetes mellitus, type II ([place])    Hypertension    Schizophrenia ([place])    No past surgical history on file. Family History:  Family History  Problem Relation Age of Onset   Diabetes Mother    Glaucoma Mother    Heart disease Mother    Heart attack Mother    Depression Mother    Hypertension Mother    Diabetes Father    Glaucoma Father    Hypertension Father    Diabetes Sister    Depression Sister    Post-traumatic stress disorder Sister    Family Psychiatric  History: See previous Social History:  Social History   Substance and  Sexual Activity  Alcohol Use No   Alcohol/week: 0.0 standard drinks     Social History   Substance and Sexual Activity  Drug Use No    Social History   Socioeconomic History   Marital status: Single    Spouse name: Not on file   Number of children: Not on file   Years of education: Not on file   Highest education level: Not on file  Occupational History   Not on file  Tobacco Use   Smoking status: Every Day    Packs/day: 1.00    Types: Cigarettes    Start date: 04/21/1978   Smokeless tobacco: Never  Vaping Use   Vaping Use: Never used  Substance and Sexual Activity   Alcohol use: No    Alcohol/week: 0.0 standard drinks   Drug use: No   Sexual activity: Never  Other Topics Concern   Not on file  Social History Narrative   Not on file   Social Determinants of Health   Financial Resource Strain: Not on file  Food Insecurity: Not on file  Transportation Needs: Not on file  Physical Activity: Not on file  Stress: Not on file  Social Connections: Not on file   Additional Social History:    Allergies:   Allergies  Allergen Reactions   Penicillins     Rash    Labs:  Results for orders placed or performed during the hospital encounter  of 06/08/21 (from the past 48 hour(s))  Comprehensive metabolic panel     Status: Abnormal   Collection Time: 06/08/21 10:36 AM  Result Value Ref Range   Sodium 146 (H) 135 - 145 mmol/L   Potassium 4.1 3.5 - 5.1 mmol/L   Chloride 109 98 - 111 mmol/L   CO2 25 22 - 32 mmol/L   Glucose, Bld 141 (H) 70 - 99 mg/dL    Comment: Glucose reference range applies only to samples taken after fasting for at least 8 hours.   BUN 18 8 - 23 mg/dL   Creatinine, Ser 1.30 (H) 0.44 - 1.00 mg/dL   Calcium 9.4 8.9 - 10.3 mg/dL   Total Protein 6.9 6.5 - 8.1 g/dL   Albumin 2.8 (L) 3.5 - 5.0 g/dL   AST 22 15 - 41 U/L   ALT 23 0 - 44 U/L   Alkaline Phosphatase 58 38 - 126 U/L   Total Bilirubin 0.6 0.3 - 1.2 mg/dL   GFR, Estimated 47 (L) >60  mL/min    Comment: (NOTE) Calculated using the CKD-EPI Creatinine Equation (2021)    Anion gap 12 5 - 15    Comment: Performed at [facility], [address] [place], [place] [address]  CBC     Status: Abnormal   Collection Time: 06/08/21 10:36 AM  Result Value Ref Range   WBC 8.8 4.0 - 10.5 K/uL   RBC 3.46 (L) 3.87 - 5.11 MIL/uL   Hemoglobin 9.6 (L) 12.0 - 15.0 g/dL   HCT 30.9 (L) 36.0 - 46.0 %   MCV 89.3 80.0 - 100.0 fL   MCH 27.7 26.0 - 34.0 pg   MCHC 31.1 30.0 - 36.0 g/dL   RDW 17.0 (H) 11.5 - 15.5 %   Platelets 410 (H) 150 - 400 K/uL   nRBC 0.0 0.0 - 0.2 %    Comment: Performed at [facility], [address] [place], [place] [address]  Resp Panel by RT-PCR (Flu A&B, Covid) Nasopharyngeal Swab     Status: None   Collection Time: 06/08/21  3:03 PM   Specimen: Nasopharyngeal Swab; Nasopharyngeal(NP) swabs in vial transport medium  Result Value Ref Range   SARS Coronavirus 2 by RT PCR NEGATIVE NEGATIVE    Comment: (NOTE) SARS-CoV-2 target nucleic acids are NOT DETECTED.  The SARS-CoV-2 RNA is generally detectable in upper respiratory specimens during the acute phase of infection. The lowest concentration of SARS-CoV-2 viral copies this assay can detect is 138 copies/mL. A negative result does not preclude SARS-Cov-2 infection and should not be used as the sole basis for treatment or other patient management decisions. A negative result may occur with  improper specimen collection/handling, submission of specimen other than nasopharyngeal swab, presence of viral mutation(s) within the areas targeted by this assay, and inadequate number of viral copies(<138 copies/mL). A negative result must be combined with clinical observations, patient history, and epidemiological information. The expected result is Negative.  Fact Sheet for Patients:  EntrepreneurPulse.com.au  Fact Sheet for Healthcare Providers:   IncredibleEmployment.be  This test is no t yet approved or cleared by the [place] FDA and  has been authorized for detection and/or diagnosis of SARS-CoV-2 by FDA under an Emergency Use Authorization (EUA). This EUA will remain  in effect (meaning this test can be used) for the duration of the COVID-19 declaration under Section 564(b)(1) of the Act, 21 U.S.C.section 360bbb-3(b)(1), unless the authorization is terminated  or revoked sooner.  Influenza A by PCR NEGATIVE NEGATIVE   Influenza B by PCR NEGATIVE NEGATIVE    Comment: (NOTE) The Xpert Xpress SARS-CoV-2/FLU/RSV plus assay is intended as an aid in the diagnosis of influenza from Nasopharyngeal swab specimens and should not be used as a sole basis for treatment. Nasal washings and aspirates are unacceptable for Xpert Xpress SARS-CoV-2/FLU/RSV testing.  Fact Sheet for Patients: EntrepreneurPulse.com.au  Fact Sheet for Healthcare Providers: IncredibleEmployment.be  This test is not yet approved or cleared by the [place] FDA and has been authorized for detection and/or diagnosis of SARS-CoV-2 by FDA under an Emergency Use Authorization (EUA). This EUA will remain in effect (meaning this test can be used) for the duration of the COVID-19 declaration under Section 564(b)(1) of the Act, 21 U.S.C. section 360bbb-3(b)(1), unless the authorization is terminated or revoked.  Performed at [facility], [address], [place], [place] [address]     Current Facility-Administered Medications  Medication Dose Route Frequency Provider Last Rate Last Admin   amLODipine (NORVASC) tablet 10 mg  10 mg Oral Daily Vladimir Crofts, MD   10 mg at 06/09/21 1132   aspirin EC tablet 81 mg  81 mg Oral Daily Vladimir Crofts, MD       atorvastatin (LIPITOR) tablet 40 mg  40 mg Oral q1800 Vladimir Crofts, MD       linagliptin (TRADJENTA) tablet 5 mg  5 mg Oral Daily Vladimir Crofts, MD       losartan (COZAAR) tablet 100 mg  100 mg Oral Daily Vladimir Crofts, MD   100 mg at 06/09/21 1131   lurasidone (LATUDA) tablet 40 mg  40 mg Oral Q breakfast Vladimir Crofts, MD       metFORMIN (GLUCOPHAGE) tablet 500 mg  500 mg Oral BID Vladimir Crofts, MD       ondansetron [facility]) injection 4 mg  4 mg Intravenous Once Carrie Mew, MD       oxybutynin (DITROPAN-XL) 24 hr tablet 15 mg  15 mg Oral QHS Vladimir Crofts, MD       traZODone (DESYREL) tablet 50 mg  50 mg Oral QHS Vladimir Crofts, MD       Current Outpatient Medications  Medication Sig Dispense Refill   acetaminophen (TYLENOL) 325 MG tablet Take 325 mg by mouth every 4 (four) hours as needed.     amLODipine (NORVASC) 10 MG tablet Take 1 tablet (10 mg total) by mouth daily. 30 tablet 1   aspirin EC 81 MG EC tablet Take 1 tablet (81 mg total) by mouth daily. Swallow whole. 30 tablet 1   atorvastatin (LIPITOR) 80 MG tablet Take 0.5 tablets (40 mg total) by mouth daily at 6 PM. 30 tablet 1   INVEGA SUSTENNA injection Inject into the muscle.     loperamide (IMODIUM) 2 MG capsule Take 2 mg by mouth daily as needed.     losartan (COZAAR) 100 MG tablet Take 1 tablet (100 mg total) by mouth daily. 30 tablet 1   lurasidone (LATUDA) 40 MG TABS tablet Take 1 tablet (40 mg total) by mouth daily with breakfast. 30 tablet 0   metFORMIN (GLUCOPHAGE) 500 MG tablet Take 500 mg by mouth daily with breakfast.     oxybutynin (DITROPAN XL) 15 MG 24 hr tablet Take 1 tablet (15 mg total) by mouth at bedtime. 30 tablet 1   predniSONE (DELTASONE) 50 MG tablet Take 1 tablet (50 mg total) by mouth daily with breakfast. 5 tablet 0   sitaGLIPtin (JANUVIA) 100  MG tablet Take 1 tablet (100 mg total) by mouth daily. 30 tablet 1   traZODone (DESYREL) 150 MG tablet Take 150 mg by mouth at bedtime.     traZODone (DESYREL) 50 MG tablet Take 1 tablet (50 mg total) by mouth at bedtime. 30 tablet 1   ACCU-CHEK FASTCLIX LANCETS MISC      ACCU-CHEK SMARTVIEW test  strip      Blood Glucose Monitoring Suppl (FIFTY50 GLUCOSE METER 2.0) W/DEVICE KIT Check blood sugar 3 times a day. Dx E11.9     hydrochlorothiazide (HYDRODIURIL) 12.5 MG tablet Take 12.5 mg by mouth daily. (Patient not taking: No sig reported)     hydrOXYzine (ATARAX/VISTARIL) 25 MG tablet Take 1 tablet (25 mg total) by mouth 3 (three) times daily as needed for anxiety. (Patient not taking: No sig reported) 30 tablet 0   Lancet Devices (RELION LANCING DEVICE) MISC      lidocaine (LIDODERM) 5 % Place 1 patch onto the skin every 12 (twelve) hours. Remove & Discard patch within 12 hours or as directed by MD 10 patch 0   metFORMIN (GLUCOPHAGE XR) 500 MG 24 hr tablet Take 2 tablets (1,000 mg total) by mouth daily with breakfast. (Patient not taking: No sig reported) 60 tablet 1   omeprazole (PRILOSEC) 20 MG capsule Take 1 capsule (20 mg total) by mouth daily. (Patient not taking: No sig reported) 30 capsule 1   ondansetron (ZOFRAN ODT) 4 MG disintegrating tablet Take 1 tablet (4 mg total) by mouth every 6 (six) hours as needed for nausea or vomiting. (Patient not taking: No sig reported) 20 tablet 0    Musculoskeletal: Strength & Muscle Tone: within normal limits Gait & Station: normal Patient leans: N/A            Psychiatric Specialty Exam:  Presentation  General Appearance: No data recorded Eye Contact:No data recorded Speech:No data recorded Speech Volume:No data recorded Handedness:No data recorded  Mood and Affect  Mood:No data recorded Affect:No data recorded  Thought Process  Thought Processes:No data recorded Descriptions of Associations:No data recorded Orientation:No data recorded Thought Content:No data recorded History of Schizophrenia/Schizoaffective disorder:No data recorded Duration of Psychotic Symptoms:No data recorded Hallucinations:No data recorded Ideas of Reference:No data recorded Suicidal Thoughts:No data recorded Homicidal Thoughts:No data  recorded  Sensorium  Memory:No data recorded Judgment:No data recorded Insight:No data recorded  Executive Functions  Concentration:No data recorded Attention Span:No data recorded Recall:No data recorded Fund of Knowledge:No data recorded Language:No data recorded  Psychomotor Activity  Psychomotor Activity:No data recorded  Assets  Assets:No data recorded  Sleep  Sleep:No data recorded  Physical Exam: Physical Exam Vitals and nursing note reviewed.  Constitutional:      Appearance: Normal appearance.  HENT:     Head: Normocephalic and atraumatic.     Mouth/Throat:     Pharynx: Oropharynx is clear.  Eyes:     Pupils: Pupils are equal, round, and reactive to light.  Cardiovascular:     Rate and Rhythm: Normal rate and regular rhythm.  Pulmonary:     Effort: Pulmonary effort is normal.     Breath sounds: Normal breath sounds.  Abdominal:     General: Abdomen is flat.     Palpations: Abdomen is soft.  Musculoskeletal:        General: Normal range of motion.  Skin:    General: Skin is warm and dry.  Neurological:     General: No focal deficit present.     Mental Status: She is  alert. Mental status is at baseline.  Psychiatric:        Attention and Perception: She is inattentive.        Mood and Affect: Mood normal. Affect is blunt.        Speech: Speech is delayed.        Behavior: Behavior is slowed.        Thought Content: Thought content normal.        Cognition and Memory: Memory is impaired.   Review of Systems  Constitutional: Negative.   HENT: Negative.    Eyes: Negative.   Respiratory: Negative.    Cardiovascular: Negative.   Gastrointestinal: Negative.   Musculoskeletal: Negative.   Skin: Negative.   Neurological: Negative.   Psychiatric/Behavioral: Negative.    Blood pressure (!) 201/74, pulse 65, temperature 97.8 F (36.6 C), temperature source Oral, resp. rate 15, height '5\' 3"'  (1.6 m), weight 46 kg, SpO2 100 %. Body mass index is 17.96  kg/m.  Treatment Plan Summary: Plan Case reviewed with emergency room physician.  Patient's acute catatonic like spells seems to have resolved.  She has a safe place to live and ongoing appropriate outpatient psychiatric care.  Does not meet commitment criteria and I do not think she needs psychiatric hospitalization.  Spoke with the ER doctor and confirmed that as far as we are concerned she is psychiatrically cleared when she is medically stable for discharge.  Disposition: Patient does not meet criteria for psychiatric inpatient admission. Supportive therapy provided about ongoing stressors.  Alethia Berthold, MD 06/09/2021 5:44 PM

## 2021-06-09 NOTE — ED Notes (Signed)
Updated pt on status of admission with verbal understanding. No needs at this time. Pt's BP has been rising, MD notified and aware. Readjusted BP cuff and tubing. Orders placed for home meds including BP meds.

## 2021-06-09 NOTE — ED Notes (Signed)
Pt refused all medications. Pt explained the importance of taking her medication due to her high BP. When asking the patient why she does not want to take the medication she does not respond. Dr. Charna Archer made aware.

## 2021-06-09 NOTE — ED Notes (Signed)
Report received from Michael, RN

## 2021-06-09 NOTE — ED Notes (Signed)
Tried to call pt's nephew a second time. Again, no answer and no option to leave a message. Patient made aware

## 2021-08-14 ENCOUNTER — Emergency Department: Payer: Medicare Other

## 2021-08-14 ENCOUNTER — Other Ambulatory Visit: Payer: Self-pay

## 2021-08-14 ENCOUNTER — Emergency Department
Admission: EM | Admit: 2021-08-14 | Discharge: 2021-08-14 | Disposition: A | Discharge status: Home / Self Care | Payer: Medicare Other | Attending: Emergency Medicine | Admitting: Emergency Medicine

## 2021-08-14 ENCOUNTER — Encounter: Payer: Self-pay | Admitting: Emergency Medicine

## 2021-08-14 DIAGNOSIS — M7989 Other specified soft tissue disorders: Secondary | ICD-10-CM | POA: Diagnosis present

## 2021-08-14 DIAGNOSIS — Z7982 Long term (current) use of aspirin: Secondary | ICD-10-CM | POA: Diagnosis not present

## 2021-08-14 DIAGNOSIS — Z7984 Long term (current) use of oral hypoglycemic drugs: Secondary | ICD-10-CM | POA: Diagnosis not present

## 2021-08-14 DIAGNOSIS — E119 Type 2 diabetes mellitus without complications: Secondary | ICD-10-CM | POA: Diagnosis not present

## 2021-08-14 DIAGNOSIS — Z79899 Other long term (current) drug therapy: Secondary | ICD-10-CM | POA: Insufficient documentation

## 2021-08-14 DIAGNOSIS — I10 Essential (primary) hypertension: Secondary | ICD-10-CM | POA: Diagnosis not present

## 2021-08-14 NOTE — ED Triage Notes (Signed)
Patient ambulatory to triage with steady gait, without difficulty or distress noted; pt reports swelling to left hand noted tonight; denies any recent injury, denies any accomp symptoms

## 2021-08-14 NOTE — ED Provider Notes (Signed)
[facility] : [facility] Provider Note    Event Date/Time   First MD Initiated Contact with Patient 08/14/21 0249     (approximate)   History   Hand Problem   HPI  Rheya S Deakin is a 61 y.o. female with history of hypertension, diabetes, schizophrenia, hyperlipidemia who presents to the emergency department with complaints of left hand swelling that has been present for 3 months.  It is unclear what prompted her to come to the emergency department tonight.  She denies any injury.  No pain to the hand.  No chest pain or shortness of breath.  No fever.  No history of DVT.  She states she has been seen by her primary care doctor for this but cannot tell me what they told her was going on or their treatment plan.  Patient is right-hand dominant.   History provided by patient.      Past Medical History:  Diagnosis Date   Depression    Diabetes mellitus without complication ([place])    Diabetes mellitus, type II ([place])    Hypertension    Schizophrenia ([place])     History reviewed. No pertinent surgical history.  MEDICATIONS:  Prior to Admission medications   Medication Sig Start Date End Date Taking? Authorizing Provider  ACCU-CHEK FASTCLIX LANCETS [place]  02/17/15   [provider]  ACCU-CHEK SMARTVIEW test strip  04/02/15   [provider]  acetaminophen (TYLENOL) 325 MG tablet Take 325 mg by mouth every 4 (four) hours as needed. 03/17/21 03/17/22  [provider]  amLODipine (NORVASC) 10 MG tablet Take 1 tablet (10 mg total) by mouth daily. 07/02/20   Carrie Mew, MD  aspirin EC 81 MG EC tablet Take 1 tablet (81 mg total) by mouth daily. Swallow whole. 03/01/20   Ezekiel Slocumb, DO  atorvastatin (LIPITOR) 80 MG tablet Take 0.5 tablets (40 mg total) by mouth daily at 6 PM. 07/02/20   Carrie Mew, MD  Blood Glucose Monitoring Suppl (FIFTY50 GLUCOSE METER 2.0) W/DEVICE KIT Check blood sugar 3 times a day. Dx E11.9 07/04/14   [provider]  hydrochlorothiazide (HYDRODIURIL) 12.5 MG tablet Take 12.5 mg by mouth daily. Patient not taking: No sig reported 12/06/19   [provider]  hydrOXYzine (ATARAX/VISTARIL) 25 MG tablet Take 1 tablet (25 mg total) by mouth 3 (three) times daily as needed for anxiety. Patient not taking: No sig reported 07/02/20   Carrie Mew, MD  [facility] SUSTENNA injection Inject into the muscle. 04/28/21   [provider]  Lancet Devices (RELION LANCING DEVICE) [place]  05/09/14   [provider]  lidocaine (LIDODERM) 5 % Place 1 patch onto the skin every 12 (twelve) hours. Remove & Discard patch within 12 hours or as directed by MD 03/21/21 03/21/22  Nance Pear, MD  loperamide (IMODIUM) 2 MG capsule Take 2 mg by mouth daily as needed. 03/17/21   [provider]  losartan (COZAAR) 100 MG tablet Take 1 tablet (100 mg total) by mouth daily. 07/02/20   Carrie Mew, MD  lurasidone (LATUDA) 40 MG TABS tablet Take 1 tablet (40 mg total) by mouth daily with breakfast. 07/02/20   Carrie Mew, MD  metFORMIN (GLUCOPHAGE XR) 500 MG 24 hr tablet Take 2 tablets (1,000 mg total) by mouth daily with breakfast. Patient not taking: No sig reported 07/02/20 07/02/21  Carrie Mew, MD  metFORMIN (GLUCOPHAGE) 500 MG tablet Take 500 mg by mouth daily with breakfast. 03/09/21   [provider]  omeprazole (PRILOSEC) 20 MG capsule Take 1 capsule (20 mg total) by mouth daily. Patient not taking: No sig reported 07/02/20 07/02/21  Carrie Mew, MD  ondansetron (ZOFRAN ODT) 4 MG disintegrating tablet Take 1 tablet (4 mg total) by mouth every 6 (six) hours as needed for nausea or vomiting. Patient not taking: No sig reported 08/27/20   Delman Kitten, MD  oxybutynin (DITROPAN XL) 15 MG 24 hr tablet Take 1 tablet (15 mg total) by mouth at bedtime. 07/02/20   Carrie Mew, MD  predniSONE (DELTASONE) 50 MG tablet Take 1 tablet (50 mg total) by mouth daily with  breakfast. 05/10/21   Cuthriell, Charline Bills, PA-C  sitaGLIPtin (JANUVIA) 100 MG tablet Take 1 tablet (100 mg total) by mouth daily. 07/02/20   Carrie Mew, MD  traZODone (DESYREL) 150 MG tablet Take 150 mg by mouth at bedtime. 05/25/21   [provider]  traZODone (DESYREL) 50 MG tablet Take 1 tablet (50 mg total) by mouth at bedtime. 07/02/20   Carrie Mew, MD    Physical Exam   Triage Vital Signs: ED Triage Vitals [08/14/21 0022]  Enc Vitals Group     BP (!) 151/63     Pulse Rate 70     Resp 18     Temp 97.9 F (36.6 C)     Temp Source Oral     SpO2 100 %     Weight 103 lb (46.7 kg)     Height '5\' 3"'  (1.6 m)     Head Circumference      Peak Flow      Pain Score 10     Pain Loc      Pain Edu?      Excl. in [place]?     Most recent vital signs: Vitals:   08/14/21 0022 08/14/21 0455  BP: (!) 151/63 (!) 150/65  Pulse: 70 71  Resp: 18 16  Temp: 97.9 F (36.6 C)   SpO2: 100% 100%    CONSTITUTIONAL: Alert and oriented and responds appropriately to questions. Well-appearing; well-nourished HEAD: Normocephalic, atraumatic EYES: Conjunctivae clear, pupils appear equal, sclera nonicteric ENT: normal nose; moist mucous membranes NECK: Supple, normal ROM CARD: RRR; S1 and S2 appreciated; no murmurs, no clicks, no rubs, no gallops RESP: Normal chest excursion without splinting or tachypnea; breath sounds clear and equal bilaterally; no wheezes, no rhonchi, no rales, no hypoxia or respiratory distress, speaking full sentences ABD/GI: Normal bowel sounds; non-distended; soft, non-tender, no rebound, no guarding, no peritoneal signs BACK: The back appears normal EXT: Left upper extremity appears contracted in the fingers and wrist with some mild soft tissue swelling dorsally but no redness, warmth.  She has a 2+ left radial and ulnar pulse and normal capillary refill.  No joint effusion.  Compartments are soft.  Her extremity is nontender to palpation and there is no  deformity, ecchymosis.  I am able to fully flex her fingers passively but then she immediately contracts her hand into a fist again.  There is no tenderness over the flexor tendons. SKIN: Normal color for age and race; warm; no rash on exposed skin NEURO: Moves all extremities equally, normal speech PSYCH: Flat affect but does not appear to be responding to internal stimuli.        ED Results / Procedures / Treatments   LABS: (all labs ordered are listed, but only abnormal results are displayed) Labs Reviewed - No data to display   EKG:   RADIOLOGY: My personal review and interpretation  of imaging: X-ray shows no fracture.  No dislocation.  Ultrasound shows no DVT.  I have personally reviewed all radiology reports.   US Venous Img Upper Uni Left  Result Date: 08/14/2021 CLINICAL DATA:  Left arm swelling for 3 months EXAM: LEFT UPPER EXTREMITY VENOUS DOPPLER ULTRASOUND TECHNIQUE: Gray-scale sonography with graded compression, as well as color Doppler and duplex ultrasound were performed to evaluate the upper extremity deep venous system from the level of the subclavian vein and including the jugular, axillary, basilic, radial, ulnar and upper cephalic vein. Spectral Doppler was utilized to evaluate flow at rest and with distal augmentation maneuvers. COMPARISON:  None. FINDINGS: Contralateral Subclavian Vein:  No evidence of thrombosis. Internal Jugular Vein: No evidence of thrombosis. Subclavian Vein: No evidence of thrombosis. Axillary Vein: No evidence of thrombosis. Cephalic Vein: No evidence of thrombosis. Basilic Vein: No evidence of thrombosis. Brachial Veins: No evidence of thrombosis. Radial Veins: No evidence of thrombosis. Ulnar Veins: No evidence of thrombosis. IMPRESSION: No evidence of DVT within the left upper extremity. Electronically Signed   By: Jorje Guild M.D.   On: 08/14/2021 04:31   DG Hand Complete Left  Result Date: 08/14/2021 CLINICAL DATA:  Swelling EXAM:  LEFT HAND - COMPLETE 3+ VIEW COMPARISON:  None. FINDINGS: Diffuse hand soft tissue swelling. No acute bony abnormality. Specifically, no fracture, subluxation, or dislocation. IMPRESSION: No acute bony abnormality. Electronically Signed   By: Rolm Baptise M.D.   On: 08/14/2021 00:59     PROCEDURES:  Critical Care performed: No    Procedures    IMPRESSION / MDM / ASSESSMENT AND PLAN / ED COURSE  I reviewed the triage vital signs and the nursing notes.    Patient here with left hand swelling for 3 months.     DIFFERENTIAL DIAGNOSIS (includes but not limited to):   Chronic soft tissue swelling, contractures from a possible old stroke, DVT, fracture, tendinitis.  Appears less likely to be septic arthritis, gout, compartment syndrome, cellulitis, abscess, arterial obstruction.  I do not think this is her anginal equivalent.   PLAN: X-ray, ultrasound.   MEDICATIONS GIVEN IN ED: Medications - No data to display   ED COURSE: Patient's x-ray reviewed by myself and radiology and shows no fracture or dislocation.  Venous Doppler of the left upper extremity obtained and shows no DVT or superficial thrombophlebitis.  She has no signs of compartment syndrome, cellulitis, abscess, arterial obstruction, septic arthritis, gout on exam.  She does seem to have contractures of the left fingers and wrist and I suspect that she may have had a previous stroke although she denies this history.  Is very difficult to get a clear history from her which I think is likely due to her chronic psychiatric illness but she states that the swelling in her arm has been ongoing for 3 months.  Discussed with her that she can follow-up with orthopedics and primary care as an outpatient as this does not seem to be emergent especially given the chronicity of the symptoms.  She has no other associated symptoms including chest pain and shortness of breath.  No fever here.  She denies any pain in the left arm.  No  medications indicated here given no sign of infection and no pain.  I also do not feel she needs to go home with any prescription medications for the same reason.   At this time, I do not feel there is any life-threatening condition present. I reviewed all nursing notes, vitals, pertinent previous  records.  All labs, EKGs, imaging ordered have been independently reviewed and interpreted by myself.  I have reviewed nursing notes and appropriate previous records.  I feel the patient is safe to be discharged home without further emergent workup and can continue workup as an outpatient as needed. Discussed all findings and treatment plan with patient as well as usual and customary return precautions.  They verbalize understanding and are comfortable with this plan.  Outpatient follow-up has been provided as needed. All questions have been answered.     CONSULTS: Admission was not needed at this time given patient's symptoms are chronic and she was hemodynamically stable without any sign of life-threatening illness or injury.   OUTSIDE RECORDS REVIEWED: I reviewed patient's previous records and it appears she has been seen several times in the emergency department for complaints of left arm pain.  She had an MRI of her brain on 06/08/2021 which showed no acute intracranial abnormality.  MRI of her cervical spine showed diffuse spinal stenosis but no cord impingement or cord signal changes.  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         FINAL CLINICAL IMPRESSION(S) / ED DIAGNOSES   Final diagnoses:  Swelling of left hand     Rx / DC Orders   ED Discharge Orders     None        Note:  This document was prepared using Dragon voice recognition software and may include unintentional dictation errors.   Halah Whiteside, Delice Bison, DO 08/14/21 [phone]

## 2021-08-14 NOTE — Discharge Instructions (Signed)
You were seen in the emergency department for left hand swelling that has been ongoing for several months.  X-ray showed no bony abnormality including any fractures or dislocations today.  Ultrasound showed no blood clots.  You have no obvious sign of infection on exam.  I recommend if this continues that you follow-up closely with your primary care doctor and orthopedics as an outpatient.  Please return to the emergency department if you develop redness or warmth to this hand, your hand is cold or blue, you have fever of 100.4 or higher, begin having chest pain or shortness of breath or begin having numbness or weakness in this hand that is new for you.  Steps to find a Primary Care Provider (PCP):  Call [phone] or [phone] to access "[facility]."  2.  You may also go on the [facility] website at [email or web address]

## 2021-08-25 ENCOUNTER — Emergency Department: Payer: Medicare Other

## 2021-08-25 ENCOUNTER — Encounter: Payer: Self-pay | Admitting: Emergency Medicine

## 2021-08-25 ENCOUNTER — Other Ambulatory Visit: Payer: Self-pay

## 2021-08-25 ENCOUNTER — Emergency Department
Admission: EM | Admit: 2021-08-25 | Discharge: 2021-08-25 | Disposition: A | Discharge status: Home / Self Care | Payer: Medicare Other | Attending: Emergency Medicine | Admitting: Emergency Medicine

## 2021-08-25 DIAGNOSIS — R609 Edema, unspecified: Secondary | ICD-10-CM

## 2021-08-25 DIAGNOSIS — M7989 Other specified soft tissue disorders: Secondary | ICD-10-CM | POA: Insufficient documentation

## 2021-08-25 DIAGNOSIS — R2232 Localized swelling, mass and lump, left upper limb: Secondary | ICD-10-CM

## 2021-08-25 DIAGNOSIS — I10 Essential (primary) hypertension: Secondary | ICD-10-CM | POA: Insufficient documentation

## 2021-08-25 DIAGNOSIS — R531 Weakness: Secondary | ICD-10-CM | POA: Diagnosis present

## 2021-08-25 DIAGNOSIS — E119 Type 2 diabetes mellitus without complications: Secondary | ICD-10-CM | POA: Diagnosis not present

## 2021-08-25 DIAGNOSIS — G5632 Lesion of radial nerve, left upper limb: Secondary | ICD-10-CM | POA: Diagnosis not present

## 2021-08-25 NOTE — ED Triage Notes (Addendum)
Pt to ED via [facility] with c/o right arm swelling and weakness. She has been seen here for her right arm swelling a few weeks ago and she states that she was told she may of had a mild stroke. EMS was called to the bank where she was today because when she walked in she leaned over to the left side and she is usually able to walk without difficulty. Pt is concerned about her left arm hurting she states that's why she told them to call EMS

## 2021-08-25 NOTE — ED Provider Triage Note (Signed)
Emergency Medicine Provider Triage Evaluation Note  Alicia Rogers, a 61 y.o. female  was evaluated in triage.  Pt complains of with a history of left upper extremity swelling and weakness presents to the ED for left hand pain.  Patient was apparently at the bank when she experienced non-traumatic left hand pain.   Review of Systems  Positive: Left hand swelling Negative: FCS  Physical Exam  BP (!) 197/64 (BP Location: Right Arm)    Pulse 68    Temp 98.8 F (37.1 C) (Oral)    Resp 18    Ht 5\' 3"  (1.6 m)    SpO2 99%    BMI 18.25 kg/m  Gen:   Awake, no distress   Resp:  Normal effort  MSK:   Moves extremities without difficulty LUE with STS. No erythema/warmth Other:  CVS: RRR  Medical Decision Making  Medically screening exam initiated at 11:55 AM.  Appropriate orders placed.  Alicia Rogers was informed that the remainder of the evaluation will be completed by another provider, this initial triage assessment does not replace that evaluation, and the importance of remaining in the ED until their evaluation is complete.  Patient with ED evaluation of chronic left hand swelling, presenting with some acute, nontraumatic pain.   Melvenia Needles, PA-C 08/25/21 1217

## 2021-08-25 NOTE — ED Notes (Signed)
This RN called pts brother, Laurisa Sahakian at [phone], no answer, LVM to call the ED back in regards to someone helping to get pt home.

## 2021-08-25 NOTE — ED Notes (Signed)
Pt lives with brother who is in need of help with caring for pt as well as placement for pt due to his own medical conditions. Social work follow up order placed with contact for pts Niece, Zerina Hallinan @ [phone].   Pt picked up at discharge and taken home with family until social work can follow up.

## 2021-08-25 NOTE — ED Notes (Signed)
Pt transported to MRI via stretcher with MRI tech.

## 2021-08-25 NOTE — Discharge Instructions (Signed)
Keep the arm in the splint at all times except when showering until you follow-up with a neurologist in a few weeks.  You can also follow-up with your primary care doctor.  Try to keep the arm elevated when you are able to.  Return to the ER for new, worsening, or persistent severe pain, swelling, weakness or numbness in the arm or in other areas, difficulty with vision or speech,, pain going up the arm, fever, or any other new or worsening symptoms that concern you.

## 2021-08-25 NOTE — ED Notes (Signed)
This RN called pts sister Tiena Manansala at [phone], no answer, unable to leave vm.

## 2021-08-25 NOTE — ED Provider Notes (Signed)
[facility] Provider Note    Event Date/Time   First MD Initiated Contact with Patient 08/25/21 1544     (approximate)   History   Weakness   HPI  Level 5 caveat: History of present illness limited due to disorganized historian  Alicia Rogers is a 61 y.o. female with history of hypertension, diabetes, hyperlipidemia and schizophrenia who presents with left wrist and arm weakness and pain which has been present for at least several weeks.  It is unclear exactly what changed today; the patient is not really able to specify although it appears that her main concern is that it was somewhat more painful and more swollen today.  However, EMS reported that the patient was at a bank and was leaning to the left side while walking.  The patient herself denies any difficulty walking or any weakness elsewhere.  She denies any new injuries.     Physical Exam   Triage Vital Signs: ED Triage Vitals  Enc Vitals Group     BP 08/25/21 1147 (!) 197/64     Pulse Rate 08/25/21 1147 68     Resp 08/25/21 1147 18     Temp 08/25/21 1147 98.8 F (37.1 C)     Temp Source 08/25/21 1147 Oral     SpO2 08/25/21 1147 99 %     Weight 08/25/21 1526 102 lb 15.3 oz (46.7 kg)     Height 08/25/21 1152 5\' 3"  (1.6 m)     Head Circumference --      Peak Flow --      Pain Score 08/25/21 1150 8     Pain Loc --      Pain Edu? --      Excl. in [place]? --     Most recent vital signs: Vitals:   08/25/21 1147  BP: (!) 197/64  Pulse: 68  Resp: 18  Temp: 98.8 F (37.1 C)  SpO2: 99%    General: Awake, no distress. CV:  Good peripheral perfusion.  Resp:  Normal effort.  Abd:  No distention.  Other:  Left wrist with weakness in extension.  Weakness in abduction of the left upper extremity.  Full range of motion passively in the shoulder, elbow, and wrist.  Swelling to the dorsum of the left hand.  No erythema, induration, or abnormal warmth.  2+ radial pulse.  Cap refill less than 2  seconds.  No swelling, tenderness, erythema, or induration to the proximal arm.  5/5 motor strength and intact sensation to all other extremities.  No facial droop.  No ataxia although the patient does have a mild tremor in the arms.  Normal gait.   ED Results / Procedures / Treatments   Labs (all labs ordered are listed, but only abnormal results are displayed) Labs Reviewed - No data to display   EKG     RADIOLOGY  XR L hand: I reviewed the images; there is no evidence of acute fracture.  Radiology read confirms soft tissue swelling with no other acute abnormality.  MR brain interpreted by me shows no acute CVA; review of the radiology report confirms no acute findings:  IMPRESSION:  No acute abnormality. Mild to moderate white matter changes  unchanged from the prior study and most consistent with chronic  microvascular ischemia. Correlate with risk factors for small vessel  disease.    PROCEDURES:  Critical Care performed: No  Procedures   MEDICATIONS ORDERED IN ED: Medications - No data to display  IMPRESSION / MDM / ASSESSMENT AND PLAN / ED COURSE  I reviewed the triage vital signs and the nursing notes.  61 year old female with PMH as noted above presents with persistent and somewhat worsened left wrist and hand weakness and swelling over the last several weeks.  Due to the patient's psychiatric history (and specifically history of catatonia) she has a flat affect and is somewhat difficult to obtain clear history from.  This is noted in prior ED visits as well, so she appears to be at her baseline.  The patient herself states that the swelling and pain in the wrist and hand are worse, and denies other symptoms, but EMS noted that the patient was observed to be leaning to the left while walking.  On exam, the patient is hypertensive with otherwise normal vital signs.  She has a left wrist drop and some weakness in abduction of the left arm as well as decreased  sensation to the dorsum of the left hand.  There is swelling to the left hand which appears similar, perhaps slightly worsened than the picture seen in the ED note from 1/13.  I reviewed the past medical records.  The patient was seen in the ED on 1/13 for the same complaint and had a negative left upper extremity DVT study at that time.  On 06/08/21 she presented for altered mental status and had MRI of the brain and cervical spine at that time which were negative for acute stroke or acute C-spine impingement.  I reviewed the psychiatry evaluation from Dr. Weber Cooks on 11/7; he diagnosed the patient with likely catatonia at that time.  In terms of the presentation today, it appears that the symptoms in the left arm and hand are a progression of a subacute to chronic problem.  Overall this is entirely consistent with a left wrist drop or radial nerve palsy.  Given the lack of other neurologic deficits I do not suspect acute stroke.  The patient does not appear altered or acutely catatonic.  There is no evidence of cellulitis, and I do not suspect DVT given that the patient had a negative DVT study 11 days ago and has no specific upper extremity DVT risk factors.  However, due to the patient's difficulty given a thorough history, as well as the EMS report of possible gait disturbance, we will obtain a repeat MRI today for TIA rule out.  If this is negative anticipate discharge home with treatment and follow-up for wrist drop.  ----------------------------------------- 7:24 PM on 08/25/2021 -----------------------------------------  MRI is negative for acute CVA or other acute abnormalities.  Overall presentation therefore is consistent with radial nerve palsy.  I consulted Dr. Cheral Marker from neurology who agrees with splinting and recommends outpatient follow-up.  The left wrist was splinted in slight extension by the ED tech.  I counseled the patient on the results of the work-up.  I recommend that she keep  the splint on and keep the arm elevated when possible.  I instructed her to follow-up with neurology and have provided referral information.  I gave her thorough return precautions and she expressed understanding.   FINAL CLINICAL IMPRESSION(S) / ED DIAGNOSES   Final diagnoses:  Radial nerve palsy, left  Localized swelling on left hand     Rx / DC Orders   ED Discharge Orders     None        Note:  This document was prepared using Dragon voice recognition software and may include unintentional dictation errors.  Arta Silence, MD 08/25/21 [phone]

## 2021-09-04 ENCOUNTER — Emergency Department: Payer: Medicare Other

## 2021-09-04 ENCOUNTER — Other Ambulatory Visit: Payer: Self-pay

## 2021-09-04 ENCOUNTER — Emergency Department
Admission: EM | Admit: 2021-09-04 | Discharge: 2021-09-04 | Disposition: A | Discharge status: Home / Self Care | Payer: Medicare Other | Attending: Emergency Medicine | Admitting: Emergency Medicine

## 2021-09-04 DIAGNOSIS — M79642 Pain in left hand: Secondary | ICD-10-CM | POA: Diagnosis not present

## 2021-09-04 DIAGNOSIS — G5632 Lesion of radial nerve, left upper limb: Secondary | ICD-10-CM | POA: Insufficient documentation

## 2021-09-04 LAB — CBC
HCT: 30.4 % — ABNORMAL LOW (ref 36.0–46.0)
Hemoglobin: 9.3 g/dL — ABNORMAL LOW (ref 12.0–15.0)
MCH: 28.4 pg (ref 26.0–34.0)
MCHC: 30.6 g/dL (ref 30.0–36.0)
MCV: 92.7 fL (ref 80.0–100.0)
Platelets: 419 10*3/uL — ABNORMAL HIGH (ref 150–400)
RBC: 3.28 MIL/uL — ABNORMAL LOW (ref 3.87–5.11)
RDW: 14.2 % (ref 11.5–15.5)
WBC: 9.6 10*3/uL (ref 4.0–10.5)
nRBC: 0 % (ref 0.0–0.2)

## 2021-09-04 LAB — BASIC METABOLIC PANEL
Anion gap: 9 (ref 5–15)
BUN: 32 mg/dL — ABNORMAL HIGH (ref 8–23)
CO2: 25 mmol/L (ref 22–32)
Calcium: 9 mg/dL (ref 8.9–10.3)
Chloride: 106 mmol/L (ref 98–111)
Creatinine, Ser: 2.22 mg/dL — ABNORMAL HIGH (ref 0.44–1.00)
GFR, Estimated: 25 mL/min — ABNORMAL LOW (ref >60)
Glucose, Bld: 238 mg/dL — ABNORMAL HIGH (ref 70–99)
Potassium: 3.4 mmol/L — ABNORMAL LOW (ref 3.5–5.1)
Sodium: 140 mmol/L (ref 135–145)

## 2021-09-04 MED ORDER — OXYCODONE-ACETAMINOPHEN 5-325 MG PO TABS
1.0000 | ORAL_TABLET | Freq: Once | ORAL | Status: AC
  Administered 2021-09-04: 1 via ORAL
  Filled 2021-09-04: qty 1

## 2021-09-04 MED ORDER — OXYCODONE-ACETAMINOPHEN 5-325 MG PO TABS: 1.0000 | ORAL_TABLET | Freq: Four times a day (QID) | ORAL | 0 refills | Status: DC | PRN

## 2021-09-04 MED ORDER — GABAPENTIN 100 MG PO CAPS: 100.0000 mg | ORAL_CAPSULE | Freq: Three times a day (TID) | ORAL | 2 refills | Status: DC

## 2021-09-04 NOTE — ED Provider Notes (Signed)
[facility] Provider Note  Patient Contact: 4:24 PM (approximate)   History   Hand Problem   HPI  Alicia Rogers is a 61 y.o. female who presents emergency department complaining of left hand and arm pain.  On review of patient's medical record she has been seen multiple times here in the emergency department for similar complaint.  It appears that she has a palsy to her left upper extremity and has been referred to neurology.  Reportedly patient was seen with neurology and was referred to the emergency department today for ultrasound to ensure no DVT.  It appears that edema and pain is pretty consistent with patient's baseline.  She has had an ultrasound in the past with no evidence of DVT.  No new injuries to the upper extremity.  No chest pain, shortness of breath.  She has no headache, neck pain.  Patient has limited use of the left upper extremity at this time.     Physical Exam   Triage Vital Signs: ED Triage Vitals  Enc Vitals Group     BP 09/04/21 1443 (!) 207/75     Pulse Rate 09/04/21 1443 69     Resp 09/04/21 1443 20     Temp 09/04/21 1443 98.7 F (37.1 C)     Temp Source 09/04/21 1443 Oral     SpO2 09/04/21 1443 100 %     Weight 09/04/21 1444 107 lb (48.5 kg)     Height 09/04/21 1444 5\' 3"  (1.6 m)     Head Circumference --      Peak Flow --      Pain Score 09/04/21 1444 9     Pain Loc --      Pain Edu? --      Excl. in [place]? --     Most recent vital signs: Vitals:   09/04/21 1443  BP: (!) 207/75  Pulse: 69  Resp: 20  Temp: 98.7 F (37.1 C)  SpO2: 100%     General: Alert and in no acute distress  Cardiovascular:  Good peripheral perfusion Respiratory: Normal respiratory effort without tachypnea or retractions. Lungs CTAB. Good air entry to the bases with no decreased or absent breath sounds. Musculoskeletal: Full range of motion to all extremities.  Visualization of the left lower extremity does reveal edema and limited range  of the left upper extremity.  I have seen the patient in the past and this apparently appears to be slightly more edema than before.  There is no gross erythema.  Area is warm to palpation.  No cellulitic changes.  Limited range of motion distal to the elbow at this time.  Grip is extremely decreased in the left when compared with right which is again consistent with previous exam.  Sensation still intact to the digits.  Capillary refill intact to the digits. Neurologic:  No gross focal neurologic deficits are appreciated.  Skin:   No rash noted Other:   ED Results / Procedures / Treatments   Labs (all labs ordered are listed, but only abnormal results are displayed) Labs Reviewed  CBC - Abnormal; Notable for the following components:      Result Value   RBC 3.28 (*)    Hemoglobin 9.3 (*)    HCT 30.4 (*)    Platelets 419 (*)    All other components within normal limits  BASIC METABOLIC PANEL - Abnormal; Notable for the following components:   Potassium 3.4 (*)    Glucose, Bld  238 (*)    BUN 32 (*)    Creatinine, Ser 2.22 (*)    GFR, Estimated 25 (*)    All other components within normal limits     EKG     RADIOLOGY  I personally viewed and evaluated these images as part of my medical decision making, as well as reviewing the written report by the radiologist.  ED Provider Interpretation: No evidence of DVT on ultrasound  US Venous Img Upper Uni Left  Result Date: 09/04/2021 CLINICAL DATA:  Pain and swelling of the left upper extremity EXAM: Left UPPER EXTREMITY VENOUS DOPPLER ULTRASOUND TECHNIQUE: Gray-scale sonography with graded compression, as well as color Doppler and duplex ultrasound were performed to evaluate the upper extremity deep venous system from the level of the subclavian vein and including the jugular, axillary, basilic, radial, ulnar and upper cephalic vein. Spectral Doppler was utilized to evaluate flow at rest and with distal augmentation maneuvers.  COMPARISON:  08/14/2021 FINDINGS: Study was difficult because of problems with patient positioning. Contralateral Subclavian Vein: Respiratory phasicity is normal and symmetric with the symptomatic side. No evidence of thrombus. Normal compressibility. Internal Jugular Vein: No evidence of thrombus. Normal compressibility, respiratory phasicity and response to augmentation. Subclavian Vein: No evidence of thrombus. Normal compressibility, respiratory phasicity and response to augmentation. Axillary Vein: Not well visualized. Cephalic Vein: No evidence of thrombus. Normal compressibility, respiratory phasicity and response to augmentation. Basilic Vein: No evidence of thrombus. Normal compressibility, respiratory phasicity and response to augmentation. Brachial Veins: No evidence of thrombus. Normal compressibility, respiratory phasicity and response to augmentation. Radial Veins: No evidence of thrombus. Normal compressibility, respiratory phasicity and response to augmentation. Ulnar Veins: Not visualized. Venous Reflux:  None visualized. Other Findings:  None visualized. IMPRESSION: No evidence of DVT within the left upper extremity. Technically difficult exam. Poor visualization of the axillary vein and ulnar vein, but otherwise normal, therefore unlikely that there would be localized thrombosis in those regions. Electronically Signed   By: Nelson Chimes M.D.   On: 09/04/2021 20:28    PROCEDURES:  Critical Care performed: No  Procedures   MEDICATIONS ORDERED IN ED: Medications  oxyCODONE-acetaminophen (PERCOCET/ROXICET) 5-325 MG per tablet 1 tablet (has no administration in time range)     IMPRESSION / MDM / ASSESSMENT AND PLAN / ED COURSE  I reviewed the triage vital signs and the nursing notes.                              Differential diagnosis includes, but is not limited to, DVT, nerve palsy   Patient's diagnosis is consistent with palsy of the left arm.  Patient presented to emergency  department after being evaluated by neurology today.  There is concern that patient may have a DVT as she had had some increased edema of the hand.  Patient did have more edema of the hand when compared with previously.  Evaluating the patient she had her splint that went been applied at her last ED visit.  After evaluation I did order an ultrasound for evaluation for DVT but was also concerned that the splint may be too tight restricting the patient's arm.  After removal patient had an improvement of her pain but still has ongoing constant/chronic pain that has been ongoing for several months.  Ultrasound revealed no evidence of DVT.  Patient may transition into a Velcro splint to prevent over tightening as well as allow the patient to have some  freedom of her arm.  Patient is given Percocet here.  I will prescribe a short course of pain medication for the patient.  I will also place the patient on gabapentin at this time.  Follow-up with neurology.  Return precautions discussed with the patient..  Patient is given ED precautions to return to the ED for any worsening or new symptoms.        FINAL CLINICAL IMPRESSION(S) / ED DIAGNOSES   Final diagnoses:  Radial nerve palsy, left     Rx / DC Orders   ED Discharge Orders          Ordered    oxyCODONE-acetaminophen (PERCOCET/ROXICET) 5-325 MG tablet  Every 6 hours PRN        09/04/21 2111    gabapentin (NEURONTIN) 100 MG capsule  3 times daily        09/04/21 2111             Note:  This document was prepared using Dragon voice recognition software and may include unintentional dictation errors.   Brynda Peon 09/04/21 2112    Carrie Mew, MD 09/08/21 [phone]

## 2021-09-04 NOTE — ED Notes (Signed)
Patient reports splint has been in oplace since last visit.  Splint removed due to being too tight.  + radial

## 2021-09-04 NOTE — ED Notes (Addendum)
First RN note:  Pt comes into the ED via [facility] clinic for worsening swelling and pain to the left hand and wrist.  Pt was seen her eon 08/25/21 and diagnosed with radial nerve palsy.

## 2021-09-04 NOTE — ED Triage Notes (Signed)
Pt here with left hand swelling that started a few months ago. Pt states that her left hand has pain sometimes but she is more concerned about the swelling. Pt in NAD in triage.

## 2021-09-04 NOTE — ED Notes (Signed)
Pt refusing splint.

## 2021-09-20 ENCOUNTER — Emergency Department (EMERGENCY_DEPARTMENT_HOSPITAL)
Admission: EM | Admit: 2021-09-20 | Discharge: 2021-09-21 | Disposition: A | Discharge status: Other Acute Inpatient Hospital | Payer: Medicare Other | Source: Home / Self Care | Attending: Emergency Medicine | Admitting: Emergency Medicine

## 2021-09-20 ENCOUNTER — Other Ambulatory Visit: Payer: Self-pay

## 2021-09-20 DIAGNOSIS — F989 Unspecified behavioral and emotional disorders with onset usually occurring in childhood and adolescence: Secondary | ICD-10-CM | POA: Insufficient documentation

## 2021-09-20 DIAGNOSIS — E119 Type 2 diabetes mellitus without complications: Secondary | ICD-10-CM | POA: Insufficient documentation

## 2021-09-20 DIAGNOSIS — M7989 Other specified soft tissue disorders: Secondary | ICD-10-CM | POA: Insufficient documentation

## 2021-09-20 DIAGNOSIS — I10 Essential (primary) hypertension: Secondary | ICD-10-CM | POA: Insufficient documentation

## 2021-09-20 DIAGNOSIS — R4689 Other symptoms and signs involving appearance and behavior: Secondary | ICD-10-CM | POA: Diagnosis not present

## 2021-09-20 DIAGNOSIS — Z20822 Contact with and (suspected) exposure to covid-19: Secondary | ICD-10-CM | POA: Insufficient documentation

## 2021-09-20 DIAGNOSIS — F250 Schizoaffective disorder, bipolar type: Secondary | ICD-10-CM | POA: Insufficient documentation

## 2021-09-20 DIAGNOSIS — Y900 Blood alcohol level of less than 20 mg/100 ml: Secondary | ICD-10-CM | POA: Insufficient documentation

## 2021-09-20 DIAGNOSIS — F251 Schizoaffective disorder, depressive type: Secondary | ICD-10-CM | POA: Diagnosis present

## 2021-09-20 LAB — COMPREHENSIVE METABOLIC PANEL
ALT: 17 U/L (ref 0–44)
AST: 17 U/L (ref 15–41)
Albumin: 2.6 g/dL — ABNORMAL LOW (ref 3.5–5.0)
Alkaline Phosphatase: 110 U/L (ref 38–126)
Anion gap: 9 (ref 5–15)
BUN: 28 mg/dL — ABNORMAL HIGH (ref 8–23)
CO2: 25 mmol/L (ref 22–32)
Calcium: 9 mg/dL (ref 8.9–10.3)
Chloride: 106 mmol/L (ref 98–111)
Creatinine, Ser: 1.76 mg/dL — ABNORMAL HIGH (ref 0.44–1.00)
GFR, Estimated: 33 mL/min — ABNORMAL LOW (ref >60)
Glucose, Bld: 257 mg/dL — ABNORMAL HIGH (ref 70–99)
Potassium: 3.9 mmol/L (ref 3.5–5.1)
Sodium: 140 mmol/L (ref 135–145)
Total Bilirubin: 0.3 mg/dL (ref 0.3–1.2)
Total Protein: 7.2 g/dL (ref 6.5–8.1)

## 2021-09-20 LAB — URINE DRUG SCREEN, QUALITATIVE (ARMC ONLY)
Amphetamines, Ur Screen: NOT DETECTED
Barbiturates, Ur Screen: NOT DETECTED
Benzodiazepine, Ur Scrn: NOT DETECTED
Cannabinoid 50 Ng, Ur ~~LOC~~: NOT DETECTED
Cocaine Metabolite,Ur ~~LOC~~: NOT DETECTED
MDMA (Ecstasy)Ur Screen: NOT DETECTED
Methadone Scn, Ur: NOT DETECTED
Opiate, Ur Screen: NOT DETECTED
Phencyclidine (PCP) Ur S: NOT DETECTED
Tricyclic, Ur Screen: NOT DETECTED

## 2021-09-20 LAB — CBC
HCT: 32.5 % — ABNORMAL LOW (ref 36.0–46.0)
Hemoglobin: 9.8 g/dL — ABNORMAL LOW (ref 12.0–15.0)
MCH: 27.1 pg (ref 26.0–34.0)
MCHC: 30.2 g/dL (ref 30.0–36.0)
MCV: 89.8 fL (ref 80.0–100.0)
Platelets: 466 10*3/uL — ABNORMAL HIGH (ref 150–400)
RBC: 3.62 MIL/uL — ABNORMAL LOW (ref 3.87–5.11)
RDW: 13.9 % (ref 11.5–15.5)
WBC: 8.6 10*3/uL (ref 4.0–10.5)
nRBC: 0 % (ref 0.0–0.2)

## 2021-09-20 LAB — URINALYSIS, ROUTINE W REFLEX MICROSCOPIC
Bilirubin Urine: NEGATIVE
Glucose, UA: 150 mg/dL — AB
Ketones, ur: NEGATIVE mg/dL
Leukocytes,Ua: NEGATIVE
Nitrite: NEGATIVE
Protein, ur: >=300 mg/dL — AB
Specific Gravity, Urine: 1.014 (ref 1.005–1.030)
pH: 6 (ref 5.0–8.0)

## 2021-09-20 LAB — CBG MONITORING, ED: Glucose-Capillary: 221 mg/dL — ABNORMAL HIGH (ref 70–99)

## 2021-09-20 LAB — TROPONIN I (HIGH SENSITIVITY)
Troponin I (High Sensitivity): 38 ng/L — ABNORMAL HIGH (ref <18)
Troponin I (High Sensitivity): 39 ng/L — ABNORMAL HIGH (ref <18)

## 2021-09-20 LAB — RESP PANEL BY RT-PCR (FLU A&B, COVID) ARPGX2
Influenza A by PCR: NEGATIVE
Influenza B by PCR: NEGATIVE
SARS Coronavirus 2 by RT PCR: NEGATIVE

## 2021-09-20 LAB — ACETAMINOPHEN LEVEL: Acetaminophen (Tylenol), Serum: <10 ug/mL — ABNORMAL LOW (ref 10–30)

## 2021-09-20 LAB — SALICYLATE LEVEL: Salicylate Lvl: <7 mg/dL — ABNORMAL LOW (ref 7.0–30.0)

## 2021-09-20 LAB — ETHANOL: Alcohol, Ethyl (B): <10 mg/dL (ref <10)

## 2021-09-20 MED ORDER — AMLODIPINE BESYLATE 5 MG PO TABS
10.0000 mg | ORAL_TABLET | Freq: Once | ORAL | Status: AC
  Administered 2021-09-20: 10 mg via ORAL
  Filled 2021-09-20: qty 2

## 2021-09-20 MED ORDER — CLONIDINE HCL 0.1 MG PO TABS
0.2000 mg | ORAL_TABLET | Freq: Once | ORAL | Status: AC
Start: 2021-09-20 — End: 2021-09-20
  Administered 2021-09-20: 0.2 mg via ORAL
  Filled 2021-09-20: qty 2

## 2021-09-20 NOTE — ED Triage Notes (Signed)
Pt here with IVC papers with [place] [facility]. Pt does not know why she is in the emergency department, swelling noted to left hand, per first rn was seen earlier today. Per ivc papers pt has schizophrenia and has not been taking her medications.

## 2021-09-20 NOTE — ED Notes (Signed)
Pt states that she has not taken her BP medication in 'a couple of days.'

## 2021-09-20 NOTE — BH Assessment (Addendum)
Comprehensive Clinical Assessment (CCA) Note  09/20/2021 Alicia Rogers [id number]  Chief Complaint: Patient is a 61 year old female presenting to [facility] ED under IVC. Per triage note Pt here with IVC papers with [place] [facility]. Pt does not know why she is in the emergency department, swelling noted to left hand, per first rn was seen earlier today. Per ivc papers pt has schizophrenia and has not been taking her medications.During assessment patient appears alert and oriented x4, calm and cooperative. When asked if patient understands why she is in the ED she reports "my legs are swollen, the police brought me here." Patient continues to be unsure why she is presenting to the ED but when asked if she is experiencing AH she reports "yes, they tell me that they don't want me to be around anyone or have anything." Patient also reports that she has not slept in over a week. Patient reports poor appetite "I stay hungry all the time." When asked about her medication compliance she reports "I haven't taken my medications ina couple of days because I had a hard time swallowing." Patient does report that she has a outpatient psychiatrist at [facility]. Patient denies SI/HI/VH, reports AH  Per Psyc NP Anette Riedel patient is recommended for Inpatient Chief Complaint  Patient presents with   IVC   Visit Diagnosis: Schizoaffective disorder, bipolar type    CCA Screening, Triage and Referral (STR)  Patient Reported Information How did you hear about [place]? Legal System  Referral name: No data recorded Referral phone number: No data recorded  Whom do you see for routine medical problems? No data recorded Practice/Facility Name: No data recorded Practice/Facility Phone Number: No data recorded Name of Contact: No data recorded Contact Number: No data recorded Contact Fax Number: No data recorded Prescriber Name: No data recorded Prescriber Address (if known): No data recorded  What Is the  Reason for Your Visit/Call Today? Patient presents under IVC due to patient not taking her medications  How Long Has This Been Causing You Problems? > than 6 months  What Do You Feel Would Help You the Most Today? -- (Assessment)   Have You Recently Been in Any Inpatient Treatment (Hospital/Detox/Crisis Center/28-Day Program)? No data recorded Name/Location of Program/Hospital:No data recorded How Long Were You There? No data recorded When Were You Discharged? No data recorded  Have You Ever Received Services From [facility] Before? No data recorded Who Do You See at [facility]? No data recorded  Have You Recently Had Any Thoughts About Hurting Yourself? No  Are You Planning to Commit Suicide/Harm Yourself At This time? No   Have you Recently Had Thoughts About [place]? No  Explanation: No data recorded  Have You Used Any Alcohol or Drugs in the Past 24 Hours? No  How Long Ago Did You Use Drugs or Alcohol? No data recorded What Did You Use and How Much? No data recorded  Do You Currently Have a Therapist/Psychiatrist? Yes  Name of Therapist/Psychiatrist: [place] Recently Discharged From Any Office Practice or Programs? No  Explanation of Discharge From Practice/Program: No data recorded    CCA Screening Triage Referral Assessment Type of Contact: Face-to-Face  Is this Initial or Reassessment? No data recorded Date Telepsych consult ordered in [facility]:  No data recorded Time Telepsych consult ordered in [facility]:  No data recorded  Patient Reported Information Reviewed? No data recorded Patient Left Without Being Seen? No data recorded Reason  for Not Completing Assessment: No data recorded  Collateral Involvement: None provided   Does Patient Have a Adrian? No data recorded Name and Contact of Legal Guardian: No data recorded If Minor and Not Living with Parent(s), Who has Custody? n/a  Is CPS  involved or ever been involved? Never  Is APS involved or ever been involved? Never   Patient Determined To Be At Risk for Harm To Self or Others Based on Review of Patient Reported Information or Presenting Complaint? No  Method: No data recorded Availability of Means: No data recorded Intent: No data recorded Notification Required: No data recorded Additional Information for Danger to Others Potential: No data recorded Additional Comments for Danger to Others Potential: No data recorded Are There Guns or Other Weapons in Your Home? No data recorded Types of Guns/Weapons: No data recorded Are These Weapons Safely Secured?                            No data recorded Who Could Verify You Are Able To Have These Secured: No data recorded Do You Have any Outstanding Charges, Pending Court Dates, Parole/Probation? No data recorded Contacted To Inform of Risk of Harm To Self or Others: No data recorded  Location of Assessment: [facility] ED   Does Patient Present under Involuntary Commitment? Yes  IVC Papers Initial File Date: 09/20/21   [place] of Residence: [place]   Patient Currently Receiving the Following Services: Medication Management   Determination of Need: Emergent (2 hours)   Options For Referral: ED Visit; Inpatient Hospitalization; Geropsychiatric Facility     CCA Biopsychosocial Intake/Chief Complaint:  No data recorded Current Symptoms/Problems: No data recorded  Patient Reported Schizophrenia/Schizoaffective Diagnosis in Past: Yes   Strengths: Patient is able to communicate her needs  Preferences: No data recorded Abilities: No data recorded  Type of Services Patient Feels are Needed: No data recorded  Initial Clinical Notes/Concerns: No data recorded  Mental Health Symptoms Depression:   Change in energy/activity   Duration of Depressive symptoms:  Greater than two weeks   Mania:   None   Anxiety:    None   Psychosis:   Hallucinations    Duration of Psychotic symptoms:  Greater than six months   Trauma:   None   Obsessions:   None   Compulsions:   None   Inattention:   None   Hyperactivity/Impulsivity:   None   Oppositional/Defiant Behaviors:   None   Emotional Irregularity:   None   Other Mood/Personality Symptoms:  No data recorded   Mental Status Exam Appearance and self-care  Stature:   Small   Weight:   Underweight   Clothing:   Neat/clean   Grooming:   Normal   Cosmetic use:   None   Posture/gait:   Normal   Motor activity:   Not Remarkable   Sensorium  Attention:   Normal   Concentration:   Normal   Orientation:   X5   Recall/memory:   Normal   Affect and Mood  Affect:   Appropriate   Mood:   Depressed   Relating  Eye contact:   Normal   Facial expression:   Responsive   Attitude toward examiner:   Cooperative   Thought and Language  Speech flow:  Soft   Thought content:   Appropriate to Mood and Circumstances   Preoccupation:   None   Hallucinations:   Auditory   Organization:  No data recorded  [employer] of Knowledge:   Average   Intelligence:   Average   Abstraction:   Functional   Judgement:   Fair   Art therapist:   Realistic   Insight:   Fair   Decision Making:   Normal   Social Functioning  Social Maturity:   Responsible   Social Judgement:   Normal   Stress  Stressors:   Other (Comment)   Coping Ability:   Normal   Skill Deficits:   Activities of daily living   Supports:   Family     Religion: Religion/Spirituality Are You A Religious Person?: No  Leisure/Recreation: Leisure / Recreation Do You Have Hobbies?: No  Exercise/Diet: Exercise/Diet Do You Exercise?: No Have You Gained or Lost A Significant Amount of Weight in the Past Six Months?: No Do You Follow a Special Diet?: No Do You Have Any Trouble Sleeping?: Yes Explanation of Sleeping Difficulties: Patient  reports not sleeping in over a week   CCA Employment/Education Employment/Work Situation: Employment / Work Situation Employment Situation: On disability Why is Patient on Disability: Mental Health How Long has Patient Been on Disability: Unknown Has Patient ever Been in the [employer]?: No  Education: Education Is Patient Currently Attending School?: No Did You Have An Individualized Education Program (IIEP): No Did You Have Any Difficulty At School?: No Patient's Education Has Been Impacted by Current Illness: No   CCA Family/Childhood History Family and Relationship History: Family history Marital status: Single  Childhood History:  Childhood History Did patient suffer any verbal/emotional/physical/sexual abuse as a child?: No Did patient suffer from severe childhood neglect?: No Has patient ever been sexually abused/assaulted/raped as an adolescent or adult?: No Was the patient ever a victim of a crime or a disaster?: No Witnessed domestic violence?: No  Child/Adolescent Assessment:     CCA Substance Use Alcohol/Drug Use: Alcohol / Drug Use Pain Medications: See MAR Prescriptions: See MAR Over the Counter: See MAR History of alcohol / drug use?: No history of alcohol / drug abuse                         ASAM's:  Six Dimensions of Multidimensional Assessment  Dimension 1:  Acute Intoxication and/or Withdrawal Potential:      Dimension 2:  Biomedical Conditions and Complications:      Dimension 3:  Emotional, Behavioral, or Cognitive Conditions and Complications:     Dimension 4:  Readiness to Change:     Dimension 5:  Relapse, Continued use, or Continued Problem Potential:     Dimension 6:  Recovery/Living Environment:     ASAM Severity Score:    ASAM Recommended Level of Treatment:     Substance use Disorder (SUD)    Recommendations for Services/Supports/Treatments:    DSM5 Diagnoses: Patient Active Problem List   Diagnosis Date Noted    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   Hypertensive urgency 02/28/2020   Back pain 02/28/2020   CKD (chronic kidney disease), stage IIIa 02/28/2020   Hypokalemia 02/28/2020   Elevated troponin 02/28/2020   Hematuria 02/28/2020   Tobacco abuse 02/28/2020   Dizziness 02/28/2020   HLD (hyperlipidemia) 02/28/2020   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   Compulsive tobacco user syndrome 10/11/2014   BP (high blood pressure) 09/04/2014   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   Claudication ([place]) 06/09/2014   Schizoaffective disorder, depressive type ([place]) 06/09/2014   Diabetes mellitus, type 2 ([place])  06/09/2014   Type 2 diabetes mellitus ([place]) 06/09/2014   Controlled type 2 diabetes mellitus without complication ([place]) 58/52/7782    Patient Centered Plan: Patient is on the following Treatment Plan(s):  Depression   Referrals to Alternative Service(s): Referred to Alternative Service(s):   Place:   Date:   Time:    Referred to Alternative Service(s):   Place:   Date:   Time:    Referred to Alternative Service(s):   Place:   Date:   Time:    Referred to Alternative Service(s):   Place:   Date:   Time:      @BHCOLLABOFCARE @  [employer], LCAS-A

## 2021-09-20 NOTE — ED Notes (Signed)
Pt dressed in purple top and socks.  Pt doesn't stand or walk well, so she was taken to room 24 to get pants changed out.  I placed a red sweater, purple tshirt, sneakers and socks, and a brown wallet with phone in it in pt belongings bag.  Bag labelled and placed at nurses station.  Pt was still wearing her gray leggings when brought back to room.

## 2021-09-20 NOTE — ED Provider Notes (Signed)
[facility] Provider Note    Event Date/Time   First MD Initiated Contact with Patient 09/20/21 2031     (approximate)   History   IVC   HPI  Alicia Rogers is a 61 y.o. female with PMH of hypertension, diabetes, hyperlipidemia, and schizophrenia who presents under involuntary commitment due to concern for decompensation of her schizophrenia, paranoid thoughts, and not being compliant with her medication.  It is unclear from the paperwork who took out the IVC on the patient.  The patient herself admits that she has been off of her medications recently although does not offer a clear reason for this.  She states that she lives with her brother and has been there for several months.  The patient denies suicidal ideation.  She states that she has been hearing voices intermittently.  She reports chronic left arm and hand pain which has been present for several months but denies any other acute complaints.    Physical Exam   Triage Vital Signs: ED Triage Vitals  Enc Vitals Group     BP 09/20/21 1943 (!) 200/80     Pulse Rate 09/20/21 1943 (!) 54     Resp 09/20/21 1947 16     Temp 09/20/21 1943 99.5 F (37.5 C)     Temp src --      SpO2 09/20/21 1947 99 %     Weight 09/20/21 1945 114 lb (51.7 kg)     Height 09/20/21 1945 5\' 3"  (1.6 m)     Head Circumference --      Peak Flow --      Pain Score 09/20/21 2031 10     Pain Loc --      Pain Edu? --      Excl. in [place]? --     Most recent vital signs: Vitals:   09/20/21 2100 09/20/21 2300  BP: (!) 196/71 (!) 165/70  Pulse: (!) 43 (!) 42  Resp: 11 12  Temp:    SpO2: 100% 100%    General: Awake, no distress.  CV:  Good peripheral perfusion.  Resp:  Normal effort.  Lungs CTAB. Abd:  No distention.  Other:  Left hand and forearm swollen with wrist drop.  2+ radial pulse.  No erythema, induration, or abnormal warmth.  Mild pain on range of motion of the wrist and forearm.  Sensation intact in the  distal arm and hand.  Motor intact in all extremities.  EOMI.  PERRLA.  Mucous membranes slightly dry.   ED Results / Procedures / Treatments   Labs (all labs ordered are listed, but only abnormal results are displayed) Labs Reviewed  COMPREHENSIVE METABOLIC PANEL - Abnormal; Notable for the following components:      Result Value   Glucose, Bld 257 (*)    BUN 28 (*)    Creatinine, Ser 1.76 (*)    Albumin 2.6 (*)    GFR, Estimated 33 (*)    All other components within normal limits  SALICYLATE LEVEL - Abnormal; Notable for the following components:   Salicylate Lvl <2.9 (*)    All other components within normal limits  ACETAMINOPHEN LEVEL - Abnormal; Notable for the following components:   Acetaminophen (Tylenol), Serum <10 (*)    All other components within normal limits  CBC - Abnormal; Notable for the following components:   RBC 3.62 (*)    Hemoglobin 9.8 (*)    HCT 32.5 (*)    Platelets 466 (*)  All other components within normal limits  CBG MONITORING, ED - Abnormal; Notable for the following components:   Glucose-Capillary 221 (*)    All other components within normal limits  TROPONIN I (HIGH SENSITIVITY) - Abnormal; Notable for the following components:   Troponin I (High Sensitivity) 38 (*)    All other components within normal limits  RESP PANEL BY RT-PCR (FLU A&B, COVID) ARPGX2  ETHANOL  URINE DRUG SCREEN, QUALITATIVE ([facility] ONLY)  URINALYSIS, ROUTINE W REFLEX MICROSCOPIC  TROPONIN I (HIGH SENSITIVITY)     EKG  ED ECG REPORT I, Arta Silence, the attending physician, personally viewed and interpreted this ECG.  Date: 09/20/2021 EKG Time: 2104 Rate: 48 Rhythm: Sinus bradycardia (incorrectly read by machine as junctional rhythm) QRS Axis: normal Intervals: normal ST/T Wave abnormalities: Nonspecific T wave abnormality Narrative Interpretation: Sinus bradycardia with no evidence of acute ischemia    RADIOLOGY    PROCEDURES:  Critical Care  performed: No  Procedures   MEDICATIONS ORDERED IN ED: Medications  amLODipine (NORVASC) tablet 10 mg (has no administration in time range)  cloNIDine (CATAPRES) tablet 0.2 mg (0.2 mg Oral Given 09/20/21 2053)     IMPRESSION / MDM / ASSESSMENT AND PLAN / ED COURSE  I reviewed the triage vital signs and the nursing notes.  61 year old female with PMH as noted above including diabetes, hypertension, hyperlipidemia, and schizophrenia presents under involuntary commitment with concern for increased paranoid thoughts and recent medication noncompliance.  The patient reports chronic left hand and forearm pain and swelling but denies other acute medical complaints.  She does endorse hearing voices.  Exam is unremarkable except for swelling and pain to the left hand and forearm, with a left wrist drop.  She is also moderately hypertensive, but does not demonstrate signs or symptoms of hypertensive urgency or end organ dysfunction.  I reviewed the past medical records in epic.  The patient was seen in the ED multiple times over the last several months for this left arm swelling and wrist drop.  She had x-rays and ultrasounds that were negative as well as a brain MRI to rule out CVA.  She was evaluated by Dr. Melrose Nakayama from neurology on 2/7 for this and was referred to physical therapy and for EMG.  She was started on baclofen and gabapentin.  In terms of the presentation today, differential diagnosis includes, but is not limited to, decompensated schizophrenia due to medication noncompliance, other acute psychosis, or possible primary medical etiology including hyperglycemia/HHS, [name], other metabolic disturbance, or less likely infection.  We will obtain lab work-up, give clonidine for the blood pressure, and obtain psychiatry and TTS consults.  Given the nonfocal neuro exam there is no evidence for urgent brain imaging.  The patient is on the cardiac monitor to evaluate for evidence of arrhythmia and/or  significant heart rate changes.  ----------------------------------------- 11:06 PM on 09/20/2021 -----------------------------------------  The patient has remained stable.  Lab work-up reveals minimally elevated troponin, although the patient's troponin is somewhat chronically elevated.  This is likely due to her elevated blood pressures in the context of medication noncompliance.  We will obtain a repeat troponin, however if it is not increasing, there is no evidence of ACS.  BMP and CBC are consistent with the patient's baseline.  Her alcohol and other toxicology screening is negative so far.  She is still pending a urinalysis.  The blood pressure has improved somewhat.  In addition to clonidine I have ordered a dose of Norvasc.  I expect that  the patient likely will be medically cleared and then disposition will be based on psychiatry team recommendations.  I have signed the patient out to the oncoming ED physician Dr. Beather Arbour.   FINAL CLINICAL IMPRESSION(S) / ED DIAGNOSES   Final diagnoses:  Behavior concern  Hypertension, unspecified type     Rx / DC Orders   ED Discharge Orders     None        Note:  This document was prepared using Dragon voice recognition software and may include unintentional dictation errors.    Arta Silence, MD 09/20/21 [phone]

## 2021-09-21 ENCOUNTER — Inpatient Hospital Stay
Admission: AD | Admit: 2021-09-21 | Discharge: 2021-09-27 | DRG: 885 | Disposition: A | Discharge status: Other Acute Inpatient Hospital | Payer: Medicare Other | Source: Intra-hospital | Attending: Psychiatry | Admitting: Psychiatry

## 2021-09-21 ENCOUNTER — Other Ambulatory Visit: Payer: Self-pay

## 2021-09-21 ENCOUNTER — Encounter: Payer: Self-pay | Admitting: Psychiatry

## 2021-09-21 DIAGNOSIS — Z9114 Patient's other noncompliance with medication regimen: Secondary | ICD-10-CM

## 2021-09-21 DIAGNOSIS — F251 Schizoaffective disorder, depressive type: Principal | ICD-10-CM | POA: Diagnosis present

## 2021-09-21 DIAGNOSIS — Z794 Long term (current) use of insulin: Secondary | ICD-10-CM

## 2021-09-21 DIAGNOSIS — R4689 Other symptoms and signs involving appearance and behavior: Secondary | ICD-10-CM | POA: Diagnosis not present

## 2021-09-21 DIAGNOSIS — I160 Hypertensive urgency: Secondary | ICD-10-CM | POA: Diagnosis not present

## 2021-09-21 DIAGNOSIS — N1831 Chronic kidney disease, stage 3a: Secondary | ICD-10-CM | POA: Diagnosis not present

## 2021-09-21 DIAGNOSIS — I10 Essential (primary) hypertension: Secondary | ICD-10-CM | POA: Diagnosis present

## 2021-09-21 DIAGNOSIS — Z79899 Other long term (current) drug therapy: Secondary | ICD-10-CM

## 2021-09-21 DIAGNOSIS — D631 Anemia in chronic kidney disease: Secondary | ICD-10-CM | POA: Diagnosis not present

## 2021-09-21 DIAGNOSIS — G8929 Other chronic pain: Secondary | ICD-10-CM | POA: Diagnosis present

## 2021-09-21 DIAGNOSIS — F17210 Nicotine dependence, cigarettes, uncomplicated: Secondary | ICD-10-CM | POA: Diagnosis present

## 2021-09-21 DIAGNOSIS — Z7982 Long term (current) use of aspirin: Secondary | ICD-10-CM | POA: Diagnosis not present

## 2021-09-21 DIAGNOSIS — J189 Pneumonia, unspecified organism: Secondary | ICD-10-CM | POA: Diagnosis not present

## 2021-09-21 DIAGNOSIS — Z7984 Long term (current) use of oral hypoglycemic drugs: Secondary | ICD-10-CM | POA: Diagnosis not present

## 2021-09-21 DIAGNOSIS — N184 Chronic kidney disease, stage 4 (severe): Secondary | ICD-10-CM | POA: Diagnosis not present

## 2021-09-21 DIAGNOSIS — F061 Catatonic disorder due to known physiological condition: Secondary | ICD-10-CM | POA: Diagnosis not present

## 2021-09-21 DIAGNOSIS — E1165 Type 2 diabetes mellitus with hyperglycemia: Secondary | ICD-10-CM | POA: Diagnosis present

## 2021-09-21 DIAGNOSIS — Z20822 Contact with and (suspected) exposure to covid-19: Secondary | ICD-10-CM | POA: Diagnosis present

## 2021-09-21 DIAGNOSIS — I129 Hypertensive chronic kidney disease with stage 1 through stage 4 chronic kidney disease, or unspecified chronic kidney disease: Secondary | ICD-10-CM | POA: Diagnosis not present

## 2021-09-21 DIAGNOSIS — Z8249 Family history of ischemic heart disease and other diseases of the circulatory system: Secondary | ICD-10-CM | POA: Diagnosis not present

## 2021-09-21 DIAGNOSIS — G9341 Metabolic encephalopathy: Secondary | ICD-10-CM | POA: Diagnosis not present

## 2021-09-21 DIAGNOSIS — E119 Type 2 diabetes mellitus without complications: Secondary | ICD-10-CM | POA: Diagnosis not present

## 2021-09-21 DIAGNOSIS — Z833 Family history of diabetes mellitus: Secondary | ICD-10-CM | POA: Diagnosis not present

## 2021-09-21 DIAGNOSIS — R4189 Other symptoms and signs involving cognitive functions and awareness: Secondary | ICD-10-CM | POA: Diagnosis not present

## 2021-09-21 DIAGNOSIS — E1122 Type 2 diabetes mellitus with diabetic chronic kidney disease: Secondary | ICD-10-CM | POA: Diagnosis not present

## 2021-09-21 DIAGNOSIS — Z818 Family history of other mental and behavioral disorders: Secondary | ICD-10-CM | POA: Diagnosis not present

## 2021-09-21 DIAGNOSIS — E785 Hyperlipidemia, unspecified: Secondary | ICD-10-CM | POA: Diagnosis present

## 2021-09-21 DIAGNOSIS — R260 Ataxic gait: Secondary | ICD-10-CM | POA: Diagnosis present

## 2021-09-21 LAB — GLUCOSE, CAPILLARY
Glucose-Capillary: 165 mg/dL — ABNORMAL HIGH (ref 70–99)
Glucose-Capillary: 228 mg/dL — ABNORMAL HIGH (ref 70–99)

## 2021-09-21 MED ORDER — INSULIN ASPART 100 UNIT/ML IJ SOLN
0.0000 [IU] | Freq: Every day | INTRAMUSCULAR | Status: DC
  Administered 2021-09-21: 2 [IU] via SUBCUTANEOUS
  Administered 2021-09-22: 4 [IU] via SUBCUTANEOUS
  Administered 2021-09-23: 2 [IU] via SUBCUTANEOUS
  Administered 2021-09-26: 4 [IU] via SUBCUTANEOUS
  Filled 2021-09-21 (×4): qty 1

## 2021-09-21 MED ORDER — MAGNESIUM HYDROXIDE 400 MG/5ML PO SUSP: 30.0000 mL | Freq: Every day | ORAL | Status: DC | PRN

## 2021-09-21 MED ORDER — ALUM & MAG HYDROXIDE-SIMETH 200-200-20 MG/5ML PO SUSP: 30.0000 mL | ORAL | Status: DC | PRN

## 2021-09-21 MED ORDER — AMLODIPINE BESYLATE 5 MG PO TABS
10.0000 mg | ORAL_TABLET | Freq: Every day | ORAL | Status: DC
  Administered 2021-09-22: 10 mg via ORAL
  Filled 2021-09-21: qty 2

## 2021-09-21 MED ORDER — ACETAMINOPHEN 325 MG PO TABS
650.0000 mg | ORAL_TABLET | Freq: Four times a day (QID) | ORAL | Status: DC | PRN
  Administered 2021-09-22: 650 mg via ORAL
  Filled 2021-09-21: qty 2

## 2021-09-21 MED ORDER — INSULIN ASPART 100 UNIT/ML IJ SOLN
0.0000 [IU] | Freq: Three times a day (TID) | INTRAMUSCULAR | Status: DC
  Administered 2021-09-22: 2 [IU] via SUBCUTANEOUS
  Administered 2021-09-22: 3 [IU] via SUBCUTANEOUS
  Administered 2021-09-22: 2 [IU] via SUBCUTANEOUS
  Administered 2021-09-23: 3 [IU] via SUBCUTANEOUS
  Administered 2021-09-23: 2 [IU] via SUBCUTANEOUS
  Administered 2021-09-24: 3 [IU] via SUBCUTANEOUS
  Administered 2021-09-24 – 2021-09-25 (×4): 2 [IU] via SUBCUTANEOUS
  Administered 2021-09-26: 1 [IU] via SUBCUTANEOUS
  Administered 2021-09-26: 7 [IU] via SUBCUTANEOUS
  Administered 2021-09-26: 3 [IU] via SUBCUTANEOUS
  Filled 2021-09-21 (×14): qty 1

## 2021-09-21 MED ORDER — LOSARTAN POTASSIUM 25 MG PO TABS
100.0000 mg | ORAL_TABLET | Freq: Every day | ORAL | Status: DC
  Administered 2021-09-21 – 2021-09-26 (×5): 100 mg via ORAL
  Filled 2021-09-21 (×7): qty 4

## 2021-09-21 NOTE — ED Notes (Signed)
Pt resting and given warm blanket

## 2021-09-21 NOTE — Consult Note (Signed)
[facility] Consult   Reason for Consult:  psych eval Referring Physician:  EDP Patient Identification: Alicia Rogers MRN:  [id number] Principal Diagnosis: Schizoaffective disorder, depressive type ([place]) Diagnosis:  Principal Problem:   Schizoaffective disorder, depressive type ([place])   Total Time spent with patient: 45 minutes  Subjective:   "Im her because my sister said I need to be evaluated"  HPI: Alicia Rogers, 61 y.o., female patient seen via tele health by this provider; chart reviewed and consulted with Dr. Dwyane Dee on 09/21/21.  Per triage note, Pt here with IVC papers with [facility]. Pt does not know why she is in the emergency department, swelling noted to left hand, per first rn was seen earlier today. Per ivc papers pt has schizophrenia and has not been taking her medications.  During the assessment with writer, patient is slow to respond to questions. Patient appears to be thought blocking. She is unaware of what brings her to ER this evening. She believes its due to her swollen feet.  She admits to not taking her diabetes medication be doesn't mention any other missed medications, specifically psychiatric medications.    Per TTS, pt does not know why she is in the emergency department, swelling noted to left hand, per first rn was seen earlier today. Per ivc papers pt has schizophrenia and has not been taking her medications.During assessment patient appears alert and oriented x4, calm and cooperative. When asked if patient understands why she is in the ED she reports "my legs are swollen, the police brought me here." Patient continues to be unsure why she is presenting to the ED but when asked if she is experiencing AH she reports "yes, they tell me that they don't want me to be around anyone or have anything." Patient also reports that she has not slept in over a week. Patient reports poor appetite "I stay hungry all the time." When asked about her medication  compliance she reports "I haven't taken my medications ina couple of days because I had a hard time swallowing." Patient does report that she has a outpatient psychiatrist at [facility]. Patient denies SI/HI/VH, reports AH    Recommendation: Inpatient psychiatric hospitalizations   Past Psychiatric History: Schizoaffective disorder  Risk to Self:   Risk to Others:   Prior Inpatient Therapy:   Prior Outpatient Therapy:    Past Medical History:  Past Medical History:  Diagnosis Date   Depression    Diabetes mellitus without complication ([place])    Diabetes mellitus, type II ([place])    Hypertension    Schizophrenia ([place])    No past surgical history on file. Family History:  Family History  Problem Relation Age of Onset   Diabetes Mother    Glaucoma Mother    Heart disease Mother    Heart attack Mother    Depression Mother    Hypertension Mother    Diabetes Father    Glaucoma Father    Hypertension Father    Diabetes Sister    Depression Sister    Post-traumatic stress disorder Sister    Family Psychiatric  History: unknown Social History:  Social History   Substance and Sexual Activity  Alcohol Use No   Alcohol/week: 0.0 standard drinks     Social History   Substance and Sexual Activity  Drug Use No    Social History   Socioeconomic History   Marital status: Single    Spouse name: Not on file   Number of  children: Not on file   Years of education: Not on file   Highest education level: Not on file  Occupational History   Not on file  Tobacco Use   Smoking status: Every Day    Packs/day: 1.00    Types: Cigarettes    Start date: 04/21/1978   Smokeless tobacco: Never  Vaping Use   Vaping Use: Never used  Substance and Sexual Activity   Alcohol use: No    Alcohol/week: 0.0 standard drinks   Drug use: No   Sexual activity: Never  Other Topics Concern   Not on file  Social History Narrative   Not on file   Social Determinants of Health    Financial Resource Strain: Not on file  Food Insecurity: Not on file  Transportation Needs: Not on file  Physical Activity: Not on file  Stress: Not on file  Social Connections: Not on file   Additional Social History:    Allergies:   Allergies  Allergen Reactions   Penicillins     Rash    Labs:  Results for orders placed or performed during the hospital encounter of 09/20/21 (from the past 48 hour(s))  Urine Drug Screen, Qualitative     Status: None   Collection Time: 09/20/21  7:49 PM  Result Value Ref Range   Tricyclic, Ur Screen NONE DETECTED NONE DETECTED   Amphetamines, Ur Screen NONE DETECTED NONE DETECTED   MDMA (Ecstasy)Ur Screen NONE DETECTED NONE DETECTED   Cocaine Metabolite,Ur [place] NONE DETECTED NONE DETECTED   Opiate, Ur Screen NONE DETECTED NONE DETECTED   Phencyclidine (PCP) Ur S NONE DETECTED NONE DETECTED   Cannabinoid 50 Ng, Ur [place] NONE DETECTED NONE DETECTED   Barbiturates, Ur Screen NONE DETECTED NONE DETECTED   Benzodiazepine, Ur Scrn NONE DETECTED NONE DETECTED   Methadone Scn, Ur NONE DETECTED NONE DETECTED    Comment: (NOTE) Tricyclics + metabolites, urine    Cutoff 1000 ng/mL Amphetamines + metabolites, urine  Cutoff 1000 ng/mL MDMA (Ecstasy), urine              Cutoff 500 ng/mL Cocaine Metabolite, urine          Cutoff 300 ng/mL Opiate + metabolites, urine        Cutoff 300 ng/mL Phencyclidine (PCP), urine         Cutoff 25 ng/mL Cannabinoid, urine                 Cutoff 50 ng/mL Barbiturates + metabolites, urine  Cutoff 200 ng/mL Benzodiazepine, urine              Cutoff 200 ng/mL Methadone, urine                   Cutoff 300 ng/mL  The urine drug screen provides only a preliminary, unconfirmed analytical test result and should not be used for non-medical purposes. Clinical consideration and professional judgment should be applied to any positive drug screen result due to possible interfering substances. A more specific alternate chemical  method must be used in order to obtain a confirmed analytical result. Gas chromatography / mass spectrometry (GC/MS) is the preferred confirm atory method. Performed at [facility], [address] [place], [place] [address]   Comprehensive metabolic panel     Status: Abnormal   Collection Time: 09/20/21  8:22 PM  Result Value Ref Range   Sodium 140 135 - 145 mmol/L   Potassium 3.9 3.5 - 5.1 mmol/L   Chloride 106 98 -  111 mmol/L   CO2 25 22 - 32 mmol/L   Glucose, Bld 257 (H) 70 - 99 mg/dL    Comment: Glucose reference range applies only to samples taken after fasting for at least 8 hours.   BUN 28 (H) 8 - 23 mg/dL   Creatinine, Ser 1.76 (H) 0.44 - 1.00 mg/dL   Calcium 9.0 8.9 - 10.3 mg/dL   Total Protein 7.2 6.5 - 8.1 g/dL   Albumin 2.6 (L) 3.5 - 5.0 g/dL   AST 17 15 - 41 U/L   ALT 17 0 - 44 U/L   Alkaline Phosphatase 110 38 - 126 U/L   Total Bilirubin 0.3 0.3 - 1.2 mg/dL   GFR, Estimated 33 (L) >60 mL/min    Comment: (NOTE) Calculated using the CKD-EPI Creatinine Equation (2021)    Anion gap 9 5 - 15    Comment: Performed at [facility], [address] [place], [place] [address]  Ethanol     Status: None   Collection Time: 09/20/21  8:22 PM  Result Value Ref Range   Alcohol, Ethyl (B) <10 <10 mg/dL    Comment: (NOTE) Lowest detectable limit for serum alcohol is 10 mg/dL.  For medical purposes only. Performed at [facility], [address] [place], [place] [address]   Salicylate level     Status: Abnormal   Collection Time: 09/20/21  8:22 PM  Result Value Ref Range   Salicylate Lvl <4.4 (L) 7.0 - 30.0 mg/dL    Comment: Performed at [facility], [address] [place], [place] [address]  Acetaminophen level     Status: Abnormal   Collection Time: 09/20/21  8:22 PM  Result Value Ref Range   Acetaminophen (Tylenol), Serum <10 (L) 10 - 30 ug/mL    Comment: (NOTE) Therapeutic concentrations vary significantly. A range of  10-30 ug/mL  may be an effective concentration for many patients. However, some  are best treated at concentrations outside of this range. Acetaminophen concentrations >150 ug/mL at 4 hours after ingestion  and >50 ug/mL at 12 hours after ingestion are often associated with  toxic reactions.  Performed at [facility], [address] [place], [place] [address]   cbc     Status: Abnormal   Collection Time: 09/20/21  8:22 PM  Result Value Ref Range   WBC 8.6 4.0 - 10.5 K/uL   RBC 3.62 (L) 3.87 - 5.11 MIL/uL   Hemoglobin 9.8 (L) 12.0 - 15.0 g/dL   HCT 32.5 (L) 36.0 - 46.0 %   MCV 89.8 80.0 - 100.0 fL   MCH 27.1 26.0 - 34.0 pg   MCHC 30.2 30.0 - 36.0 g/dL   RDW 13.9 11.5 - 15.5 %   Platelets 466 (H) 150 - 400 K/uL   nRBC 0.0 0.0 - 0.2 %    Comment: Performed at [facility], [address], [place], [place] [address]  Troponin I (High Sensitivity)     Status: Abnormal   Collection Time: 09/20/21  8:22 PM  Result Value Ref Range   Troponin I (High Sensitivity) 38 (H) <18 ng/L    Comment: (NOTE) Elevated high sensitivity troponin I (hsTnI) values and significant  changes across serial measurements may suggest ACS but many other  chronic and acute conditions are known to elevate hsTnI results.  Refer to the "Links" section for chest pain algorithms and additional  guidance. Performed at [facility], [address] [place], [place] [address]   Urinalysis,  Routine w reflex microscopic Urine, Catheterized     Status: Abnormal   Collection Time: 09/20/21  8:39 PM  Result Value Ref Range   Color, Urine STRAW (A) YELLOW   APPearance CLEAR (A) CLEAR   Specific Gravity, Urine 1.014 1.005 - 1.030   pH 6.0 5.0 - 8.0   Glucose, UA 150 (A) NEGATIVE mg/dL   Hgb urine dipstick SMALL (A) NEGATIVE   Bilirubin Urine NEGATIVE NEGATIVE   Ketones, ur NEGATIVE NEGATIVE mg/dL   Protein, ur >=300 (A) NEGATIVE mg/dL   Nitrite NEGATIVE NEGATIVE   Leukocytes,Ua  NEGATIVE NEGATIVE   RBC / HPF 0-5 0 - 5 RBC/hpf   WBC, UA 0-5 0 - 5 WBC/hpf   Bacteria, UA RARE (A) NONE SEEN   Squamous Epithelial / LPF 0-5 0 - 5   Mucus PRESENT    Hyaline Casts, UA PRESENT     Comment: Performed at [facility], [address] [place], [place] [address]  Resp Panel by RT-PCR (Flu A&B, Covid) Nasopharyngeal Swab     Status: None   Collection Time: 09/20/21  8:52 PM   Specimen: Nasopharyngeal Swab; Nasopharyngeal(NP) swabs in vial transport medium  Result Value Ref Range   SARS Coronavirus 2 by RT PCR NEGATIVE NEGATIVE    Comment: (NOTE) SARS-CoV-2 target nucleic acids are NOT DETECTED.  The SARS-CoV-2 RNA is generally detectable in upper respiratory specimens during the acute phase of infection. The lowest concentration of SARS-CoV-2 viral copies this assay can detect is 138 copies/mL. A negative result does not preclude SARS-Cov-2 infection and should not be used as the sole basis for treatment or other patient management decisions. A negative result may occur with  improper specimen collection/handling, submission of specimen other than nasopharyngeal swab, presence of viral mutation(s) within the areas targeted by this assay, and inadequate number of viral copies(<138 copies/mL). A negative result must be combined with clinical observations, patient history, and epidemiological information. The expected result is Negative.  Fact Sheet for Patients:  EntrepreneurPulse.com.au  Fact Sheet for Healthcare Providers:  IncredibleEmployment.be  This test is no t yet approved or cleared by the [place] FDA and  has been authorized for detection and/or diagnosis of SARS-CoV-2 by FDA under an Emergency Use Authorization (EUA). This EUA will remain  in effect (meaning this test can be used) for the duration of the COVID-19 declaration under Section 564(b)(1) of the Act, 21 U.S.C.section 360bbb-3(b)(1), unless the  authorization is terminated  or revoked sooner.       Influenza A by PCR NEGATIVE NEGATIVE   Influenza B by PCR NEGATIVE NEGATIVE    Comment: (NOTE) The Xpert Xpress SARS-CoV-2/FLU/RSV plus assay is intended as an aid in the diagnosis of influenza from Nasopharyngeal swab specimens and should not be used as a sole basis for treatment. Nasal washings and aspirates are unacceptable for Xpert Xpress SARS-CoV-2/FLU/RSV testing.  Fact Sheet for Patients: EntrepreneurPulse.com.au  Fact Sheet for Healthcare Providers: IncredibleEmployment.be  This test is not yet approved or cleared by the [place] FDA and has been authorized for detection and/or diagnosis of SARS-CoV-2 by FDA under an Emergency Use Authorization (EUA). This EUA will remain in effect (meaning this test can be used) for the duration of the COVID-19 declaration under Section 564(b)(1) of the Act, 21 U.S.C. section 360bbb-3(b)(1), unless the authorization is terminated or revoked.  Performed at [facility], [address] [place], [place] [address]   CBG monitoring, ED     Status: Abnormal  Collection Time: 09/20/21  9:01 PM  Result Value Ref Range   Glucose-Capillary 221 (H) 70 - 99 mg/dL    Comment: Glucose reference range applies only to samples taken after fasting for at least 8 hours.  Troponin I (High Sensitivity)     Status: Abnormal   Collection Time: 09/20/21 11:15 PM  Result Value Ref Range   Troponin I (High Sensitivity) 39 (H) <18 ng/L    Comment: (NOTE) Elevated high sensitivity troponin I (hsTnI) values and significant  changes across serial measurements may suggest ACS but many other  chronic and acute conditions are known to elevate hsTnI results.  Refer to the "Links" section for chest pain algorithms and additional  guidance. Performed at [facility], [address] [place], [place] [address]     No current  facility-administered medications for this encounter.   Current Outpatient Medications  Medication Sig Dispense Refill   ACCU-CHEK FASTCLIX LANCETS MISC      ACCU-CHEK SMARTVIEW test strip      acetaminophen (TYLENOL) 325 MG tablet Take 325 mg by mouth every 4 (four) hours as needed.     amLODipine (NORVASC) 10 MG tablet Take 1 tablet (10 mg total) by mouth daily. 30 tablet 1   aspirin EC 81 MG EC tablet Take 1 tablet (81 mg total) by mouth daily. Swallow whole. 30 tablet 1   atorvastatin (LIPITOR) 80 MG tablet Take 0.5 tablets (40 mg total) by mouth daily at 6 PM. 30 tablet 1   Blood Glucose Monitoring Suppl (FIFTY50 GLUCOSE METER 2.0) W/DEVICE KIT Check blood sugar 3 times a day. Dx E11.9     gabapentin (NEURONTIN) 100 MG capsule Take 1 capsule (100 mg total) by mouth 3 (three) times daily. 90 capsule 2   hydrochlorothiazide (HYDRODIURIL) 12.5 MG tablet Take 12.5 mg by mouth daily. (Patient not taking: No sig reported)     hydrOXYzine (ATARAX/VISTARIL) 25 MG tablet Take 1 tablet (25 mg total) by mouth 3 (three) times daily as needed for anxiety. (Patient not taking: No sig reported) 30 tablet 0   INVEGA SUSTENNA injection Inject into the muscle.     Lancet Devices (RELION LANCING DEVICE) MISC      lidocaine (LIDODERM) 5 % Place 1 patch onto the skin every 12 (twelve) hours. Remove & Discard patch within 12 hours or as directed by MD 10 patch 0   loperamide (IMODIUM) 2 MG capsule Take 2 mg by mouth daily as needed.     losartan (COZAAR) 100 MG tablet Take 1 tablet (100 mg total) by mouth daily. 30 tablet 1   lurasidone (LATUDA) 40 MG TABS tablet Take 1 tablet (40 mg total) by mouth daily with breakfast. 30 tablet 0   metFORMIN (GLUCOPHAGE XR) 500 MG 24 hr tablet Take 2 tablets (1,000 mg total) by mouth daily with breakfast. (Patient not taking: No sig reported) 60 tablet 1   metFORMIN (GLUCOPHAGE) 500 MG tablet Take 500 mg by mouth daily with breakfast.     omeprazole (PRILOSEC) 20 MG capsule  Take 1 capsule (20 mg total) by mouth daily. (Patient not taking: No sig reported) 30 capsule 1   ondansetron (ZOFRAN ODT) 4 MG disintegrating tablet Take 1 tablet (4 mg total) by mouth every 6 (six) hours as needed for nausea or vomiting. (Patient not taking: No sig reported) 20 tablet 0   oxybutynin (DITROPAN XL) 15 MG 24 hr tablet Take 1 tablet (15 mg total) by mouth at bedtime. 30 tablet 1  oxyCODONE-acetaminophen (PERCOCET/ROXICET) 5-325 MG tablet Take 1 tablet by mouth every 6 (six) hours as needed for severe pain. 20 tablet 0   predniSONE (DELTASONE) 50 MG tablet Take 1 tablet (50 mg total) by mouth daily with breakfast. 5 tablet 0   sitaGLIPtin (JANUVIA) 100 MG tablet Take 1 tablet (100 mg total) by mouth daily. 30 tablet 1   traZODone (DESYREL) 150 MG tablet Take 150 mg by mouth at bedtime.     traZODone (DESYREL) 50 MG tablet Take 1 tablet (50 mg total) by mouth at bedtime. 30 tablet 1    Musculoskeletal: Strength & Muscle Tone: abnormal Gait & Station: unsteady Patient leans: N/A            Psychiatric Specialty Exam:  Presentation  General Appearance: Bizarre; Disheveled  Eye Contact:Fair  Speech:Garbled  Speech Volume:Decreased  Handedness:Right   Mood and Affect  Mood:No data recorded Affect:Flat   Thought Process  Thought Processes:Disorganized  Descriptions of Associations:Loose  Orientation:Partial  Thought Content:Illogical  History of Schizophrenia/Schizoaffective disorder:Yes  Duration of Psychotic Symptoms:Greater than six months  Hallucinations:Hallucinations: Auditory  Ideas of Reference:No data recorded Suicidal Thoughts:Suicidal Thoughts: No  Homicidal Thoughts:Homicidal Thoughts: No   Sensorium  Memory:Immediate Poor  Judgment:Impaired  Insight:Lacking   Executive Functions  Concentration:Poor  Attention Span:Poor  Recall:Poor  Fund of Knowledge:Poor  Language:Poor   Psychomotor Activity  Psychomotor  Activity:Psychomotor Activity: Decreased   Assets  Assets:Housing; Social Support   Sleep  Sleep:Sleep: Poor   Physical Exam: Physical Exam Vitals and nursing note reviewed.  HENT:     Head: Normocephalic and atraumatic.     Nose: Nose normal.  Eyes:     Pupils: Pupils are equal, round, and reactive to light.  Cardiovascular:     Pulses: Normal pulses.  Pulmonary:     Effort: Pulmonary effort is normal.  Musculoskeletal:        General: Normal range of motion.     Cervical back: Normal range of motion.  Skin:    General: Skin is warm and dry.  Neurological:     Mental Status: She is alert. She is disoriented.  Psychiatric:        Attention and Perception: She is inattentive.        Mood and Affect: Affect is flat.        Speech: Speech is delayed.        Behavior: Behavior is slowed and withdrawn.        Cognition and Memory: Cognition is impaired. Memory is impaired.        Judgment: Judgment is inappropriate.   Review of Systems  Psychiatric/Behavioral:  Positive for hallucinations.   All other systems reviewed and are negative. Blood pressure (!) 177/71, pulse (!) 41, temperature 99.5 F (37.5 C), resp. rate 10, height _0  (1.6 m), weight 51.7 kg, SpO2 100 %. Body mass index is 20.19 kg/m.  Treatment Plan Summary: Daily contact with patient to assess and evaluate symptoms and progress in treatment, Medication management, and Plan inpatient hospitalization  Disposition: Recommend psychiatric Inpatient admission when medically cleared. Supportive therapy provided about ongoing stressors. Discussed crisis plan, support from social network, calling 911, coming to the Emergency Department, and calling Suicide Hotline.  Deloria Lair, NP 09/21/2021 3:23 AM

## 2021-09-21 NOTE — ED Notes (Signed)
Pt was able to ambulate to toilet and back, steady with 1 assist.

## 2021-09-21 NOTE — ED Notes (Signed)
Breakfast tray given. °

## 2021-09-21 NOTE — ED Notes (Signed)
Patient to be admitted to [facility] psych unit (estimated time =1430-1500).

## 2021-09-21 NOTE — Progress Notes (Signed)
Patient admitted IVC to [facility] from [facility] ED with diagnosis of schizoaffective disorder. Patient presents to unit via [place] A[facility]. Patient states, "I am here for evaluation and blood pressure medications."  States her brother dropped her off here. Patient's affect is flat, speech is slow, and thoughts are organized. Patient denies depression and anxiety. Currently denies suicidal ideations, homicidal ideations, audio or visual hallucinations and verbally contracts for safety on unit. Reports chronic pain 8/10 to left hand which is also swollen. Patient denies drug abuse or drinking, but does smoke cigarettes. Declines nicotine patch. Patient reports living with her family. 2 rough patches noted under patients right breast and to her right hip. Also she presents with 2+ pitting edema to bilateral lower extremities.   Emotional support and reassurance provided throughout admission intake. Afterwards, oriented patient to unit, room and call light, reviewed POC with all questions answered and understanding verbalzied. Placed pt on high risk fall precautions per policy and provided unit's rolling walker for ambulation. Denies any needs at this time. Will continue to monitor with ongoing Q 15 minute safety checks per unit protocol.

## 2021-09-21 NOTE — Tx Team (Signed)
Initial Treatment Plan 09/21/2021 4:43 PM Alicia Rogers VHO:[id number]   PATIENT STRESSORS: Health problems     PATIENT IDENTIFIED PROBLEMS: Inability to sleep  Appetite  Physical health                 DISCHARGE CRITERIA:  Ability to meet basic life and health needs Adequate post-discharge living arrangements Improved stabilization in mood, thinking, and/or behavior  PRELIMINARY DISCHARGE PLAN: Return to previous living arrangement  PATIENT/FAMILY INVOLVEMENT: This treatment plan has been presented to and reviewed with the patient, Alicia Rogers.  The patient was given the opportunity to ask questions and make suggestions.  Sunday Spillers, RN 09/21/2021, 4:43 PM

## 2021-09-21 NOTE — BH Assessment (Signed)
Referral information for Psychiatric Hospitalization faxed to;  [facility] ([phone]-or- [phone]),   [facility] [phone] -or- [phone]) [phone]  [facility] ([phone]---[phone]),  [facility] ([phone]),   [facility] ([phone] -or- [phone]),   [facility] ([phone] or [phone]),     

## 2021-09-21 NOTE — ED Provider Notes (Signed)
Emergency Medicine Observation Re-evaluation Note  Alicia Rogers is a 61 y.o. female, seen on rounds today.  Pt initially presented to the ED for complaints of IVC Currently, the patient is resting, voices no medical complaints.  Physical Exam  BP (!) 177/71    Pulse (!) 41    Temp 99.5 F (37.5 C)    Resp 10    Ht 5\' 3"  (1.6 m)    Wt 51.7 kg    SpO2 100%    BMI 20.19 kg/m  Physical Exam General: Resting in no acute distress Cardiac: No cyanosis Lungs: Equal rise and fall Psych: Not agitated  ED Course / MDM  EKG:   I have reviewed the labs performed to date as well as medications administered while in observation.  Recent changes in the last 24 hours include no events overnight.  Plan  Current plan is for psychiatric disposition. Alicia Rogers is under involuntary commitment.      Paulette Blanch, MD 09/21/21 [phone]

## 2021-09-22 DIAGNOSIS — F251 Schizoaffective disorder, depressive type: Principal | ICD-10-CM

## 2021-09-22 LAB — GLUCOSE, CAPILLARY
Glucose-Capillary: 189 mg/dL — ABNORMAL HIGH (ref 70–99)
Glucose-Capillary: 210 mg/dL — ABNORMAL HIGH (ref 70–99)
Glucose-Capillary: 174 mg/dL — ABNORMAL HIGH (ref 70–99)
Glucose-Capillary: 324 mg/dL — ABNORMAL HIGH (ref 70–99)

## 2021-09-22 MED ORDER — ATORVASTATIN CALCIUM 10 MG PO TABS
20.0000 mg | ORAL_TABLET | Freq: Every day | ORAL | Status: DC
  Administered 2021-09-22 – 2021-09-26 (×4): 20 mg via ORAL
  Filled 2021-09-22 (×5): qty 2

## 2021-09-22 MED ORDER — PALIPERIDONE ER 3 MG PO TB24
6.0000 mg | ORAL_TABLET | Freq: Every day | ORAL | Status: DC
  Administered 2021-09-22 – 2021-09-26 (×5): 6 mg via ORAL
  Filled 2021-09-22 (×5): qty 2

## 2021-09-22 MED ORDER — ENSURE SURGERY PO LIQD
237.0000 mL | Freq: Two times a day (BID) | ORAL | Status: DC
  Filled 2021-09-22: qty 237

## 2021-09-22 MED ORDER — GABAPENTIN 300 MG PO CAPS
300.0000 mg | ORAL_CAPSULE | Freq: Three times a day (TID) | ORAL | Status: DC
Start: 2021-09-22 — End: 2021-09-27
  Administered 2021-09-22 – 2021-09-26 (×14): 300 mg via ORAL
  Filled 2021-09-22 (×15): qty 1

## 2021-09-22 MED ORDER — PANTOPRAZOLE SODIUM 40 MG PO TBEC
40.0000 mg | DELAYED_RELEASE_TABLET | Freq: Every day | ORAL | Status: DC
  Administered 2021-09-22 – 2021-09-26 (×5): 40 mg via ORAL
  Filled 2021-09-22 (×5): qty 1

## 2021-09-22 MED ORDER — DILTIAZEM HCL ER COATED BEADS 120 MG PO CP24
120.0000 mg | ORAL_CAPSULE | Freq: Every day | ORAL | Status: DC
  Administered 2021-09-23 – 2021-09-25 (×3): 120 mg via ORAL
  Filled 2021-09-22 (×3): qty 1

## 2021-09-22 MED ORDER — ESCITALOPRAM OXALATE 10 MG PO TABS
10.0000 mg | ORAL_TABLET | Freq: Every day | ORAL | Status: DC
  Administered 2021-09-22 – 2021-09-26 (×5): 10 mg via ORAL
  Filled 2021-09-22 (×5): qty 1

## 2021-09-22 MED ORDER — TRAZODONE HCL 50 MG PO TABS
100.0000 mg | ORAL_TABLET | Freq: Every day | ORAL | Status: DC
  Administered 2021-09-22 – 2021-09-26 (×4): 100 mg via ORAL
  Filled 2021-09-22 (×5): qty 2

## 2021-09-22 MED ORDER — GLUCERNA SHAKE PO LIQD
237.0000 mL | Freq: Three times a day (TID) | ORAL | Status: DC
  Administered 2021-09-22 – 2021-09-26 (×11): 237 mL via ORAL

## 2021-09-22 NOTE — Progress Notes (Addendum)
Newly admitted patient from previous shift, she appears restless and tired, forwards very little information, her thoughts are disorganized,  slow response. Patient was sitting in a wheel chair after some assessment  done she was noted ambulating to the bathroom independently. Patient was also noted to have (+) 2 pitting edema on her extremities especially her left wrist is swollen. Patient is a poor historian Probation officer could not ascertain if she has had an injury on this extremity. Patient currently denies pain. Patient has a history of HTN and DM. Writer contacted MD on cal for order to cover her elevated blood glucose of 228 also her elevated blood pressure. Orders are put in by MD and medication administered as ordered. 15 minutes safety checks maintained will continue to closely monitor.

## 2021-09-22 NOTE — [facility] Suicide Risk Assessment (Signed)
[facility] Admission Suicide Risk Assessment   Nursing information obtained from:  Patient Demographic factors:  NA Current Mental Status:  NA Loss Factors:  NA Historical Factors:  NA Risk Reduction Factors:  NA  Total Time spent with patient: 1 hour Principal Problem: Schizoaffective disorder, depressive type ([place]) Diagnosis:  Principal Problem:   Schizoaffective disorder, depressive type ([place])  Subjective Data: Alicia Rogers is a 61 y.o. female with PMH of hypertension, diabetes, hyperlipidemia, and schizophrenia who presents under involuntary commitment due to concern for decompensation of her schizophrenia, paranoid thoughts, and not being compliant with her medication.  It is unclear from the paperwork who took out the IVC on the patient.   The patient herself admits that she has been off of her medications recently although does not offer a clear reason for this.  She states that she lives with her brother and has been there for several months.  The patient denies suicidal ideation.  She states that she has been hearing voices intermittently.  She reports chronic left arm and hand pain which has been present for several months but denies any other acute complaints.  Continued Clinical Symptoms:  Alcohol Use Disorder Identification Test Final Score (AUDIT): 0 The "Alcohol Use Disorders Identification Test", Guidelines for Use in Primary Care, Second Edition.  World Pharmacologist [facility]). Score between 0-7:  no or low risk or alcohol related problems. Score between 8-15:  moderate risk of alcohol related problems. Score between 16-19:  high risk of alcohol related problems. Score 20 or above:  warrants further diagnostic evaluation for alcohol dependence and treatment.   CLINICAL FACTORS:   Schizophrenia:   Depressive state   Musculoskeletal: Strength & Muscle Tone: within normal limits Gait & Station: normal Patient leans: N/A  Psychiatric Specialty Exam:  Presentation   General Appearance: Bizarre; Disheveled  Eye Contact:Fair  Speech:Garbled  Speech Volume:Decreased  Handedness:Right   Mood and Affect  Mood:No data recorded Affect:Flat   Thought Process  Thought Processes:Disorganized  Descriptions of Associations:Loose  Orientation:Partial  Thought Content:Illogical  History of Schizophrenia/Schizoaffective disorder:Yes  Duration of Psychotic Symptoms:Greater than six months  Hallucinations:Hallucinations: Auditory  Ideas of Reference:No data recorded Suicidal Thoughts:Suicidal Thoughts: No  Homicidal Thoughts:Homicidal Thoughts: No   Sensorium  Memory:Immediate Poor  Judgment:Impaired  Insight:Lacking   Executive Functions  Concentration:Poor  Attention Span:Poor  Recall:Poor  Fund of Knowledge:Poor  Language:Poor   Psychomotor Activity  Psychomotor Activity:Psychomotor Activity: Decreased   Assets  Assets:Housing; Social Support   Sleep  Sleep:Sleep: Poor   Blood pressure (!) 157/72, pulse 74, temperature 98 F (36.7 C), resp. rate 18, height 5\' 3"  (1.6 m), weight 51.7 kg, SpO2 98 %. Body mass index is 20.19 kg/m.   COGNITIVE FEATURES THAT CONTRIBUTE TO RISK:  None    SUICIDE RISK:   Minimal: No identifiable suicidal ideation.  Patients presenting with no risk factors but with morbid ruminations; may be classified as minimal risk based on the severity of the depressive symptoms  PLAN OF CARE: See orders  I certify that inpatient services furnished can reasonably be expected to improve the patient's condition.   Parks Ranger, DO 09/22/2021, 10:41 AM

## 2021-09-22 NOTE — H&P (Signed)
Psychiatric Admission Assessment Adult  Patient Identification: Alicia Rogers MRN:  [id number] Date of Evaluation:  09/22/2021 Chief Complaint:  Schizoaffective disorder, depressive type ([place]) [F25.1] Principal Diagnosis: Schizoaffective disorder, depressive type ([place]) Diagnosis:  Principal Problem:   Schizoaffective disorder, depressive type ([place])  History of Present Illness: Alicia Rogers is a 61 year old African-American female who presents on an involuntary commitment after being dropped off by her brother with whom she was staying.  Apparently, she has not been taking her medications.  She admits that.  She does hear voices but denies any suicidal ideation.  She admits to depression and chronic pain.  She apparently has not been taking any of her psychiatric or medical medications.  Discussed situation with social work who is going to contact her sister with whom she is closest and possibly the brother.  She states that her sister is trying to get her her own place.  She tells me that she goes to [employer].  I discussed restarting her medications and she was okay with that.  PER INITIAL INTAKE: [name], 61 y.o. female, Per triage note, Pt here with IVC papers with [facility]. Pt does not know why she is in the emergency department, swelling noted to left hand, per first rn was seen earlier today. Per ivc papers pt has schizophrenia and has not been taking her medications.  During the assessment with writer, patient is slow to respond to questions. Patient appears to be thought blocking. She is unaware of what brings her to ER this evening. She believes its due to her swollen feet.  She admits to not taking her diabetes medication be doesn't mention any other missed medications, specifically psychiatric medications.     Per TTS, pt does not know why she is in the emergency department, swelling noted to left hand, per first rn was seen earlier today. Per ivc papers pt  has schizophrenia and has not been taking her medications.During assessment patient appears alert and oriented x4, calm and cooperative. When asked if patient understands why she is in the ED she reports "my legs are swollen, the police brought me here." Patient continues to be unsure why she is presenting to the ED but when asked if she is experiencing AH she reports "yes, they tell me that they don't want me to be around anyone or have anything." Patient also reports that she has not slept in over a week. Patient reports poor appetite "I stay hungry all the time." When asked about her medication compliance she reports "I haven't taken my medications ina couple of days because I had a hard time swallowing." Patient does report that she has a outpatient psychiatrist at [facility]. Patient denies SI/HI/VH, reports AH  Associated Signs/Symptoms: Depression Symptoms:  depressed mood, difficulty concentrating, Duration of Depression Symptoms: Greater than two weeks  (Hypo) Manic Symptoms:  Hallucinations, Anxiety Symptoms:   None Psychotic Symptoms:  Hallucinations: Auditory PTSD Symptoms: NA Total Time spent with patient: 1 hour  Past Psychiatric History: [place] in [place]  Is the patient at risk to self? No.  Has the patient been a risk to self in the past 6 months? No.  Has the patient been a risk to self within the distant past? No.  Is the patient a risk to others? No.  Has the patient been a risk to others in the past 6 months? No.  Has the patient been a risk to others within the distant past? No.  Prior Inpatient Therapy:   Prior Outpatient Therapy:    Alcohol Screening: 1. How often do you have a drink containing alcohol?: Never 2. How many drinks containing alcohol do you have on a typical day when you are drinking?: 1 or 2 3. How often do you have six or more drinks on one occasion?: Never AUDIT-C Score: 0 4. How often during the last year  have you found that you were not able to stop drinking once you had started?: Never 5. How often during the last year have you failed to do what was normally expected from you because of drinking?: Never 6. How often during the last year have you needed a first drink in the morning to get yourself going after a heavy drinking session?: Never 7. How often during the last year have you had a feeling of guilt of remorse after drinking?: Never 8. How often during the last year have you been unable to remember what happened the night before because you had been drinking?: Never 9. Have you or someone else been injured as a result of your drinking?: No 10. Has a relative or friend or a doctor or another health worker been concerned about your drinking or suggested you cut down?: No Alcohol Use Disorder Identification Test Final Score (AUDIT): 0 Substance Abuse History in the last 12 months:  No. Consequences of Substance Abuse: NA Previous Psychotropic Medications: Yes  Psychological Evaluations: No  Past Medical History:  Past Medical History:  Diagnosis Date   Depression    Diabetes mellitus without complication ([place])    Diabetes mellitus, type II ([place])    Hypertension    Schizophrenia ([place])    History reviewed. No pertinent surgical history. Family History:  Family History  Problem Relation Age of Onset   Diabetes Mother    Glaucoma Mother    Heart disease Mother    Heart attack Mother    Depression Mother    Hypertension Mother    Diabetes Father    Glaucoma Father    Hypertension Father    Diabetes Sister    Depression Sister    Post-traumatic stress disorder Sister     Tobacco Screening:   Social History:  Social History   Substance and Sexual Activity  Alcohol Use No   Alcohol/week: 0.0 standard drinks     Social History   Substance and Sexual Activity  Drug Use No    Additional Social History:                           Allergies:   Allergies   Allergen Reactions   Penicillins     Rash   Lab Results:  Results for orders placed or performed during the hospital encounter of 09/21/21 (from the past 48 hour(s))  Glucose, capillary     Status: Abnormal   Collection Time: 09/21/21  4:23 PM  Result Value Ref Range   Glucose-Capillary 165 (H) 70 - 99 mg/dL    Comment: Glucose reference range applies only to samples taken after fasting for at least 8 hours.  Glucose, capillary     Status: Abnormal   Collection Time: 09/21/21  7:28 PM  Result Value Ref Range   Glucose-Capillary 228 (H) 70 - 99 mg/dL    Comment: Glucose reference range applies only to samples taken after fasting for at least 8 hours.   Comment 1 Notify RN   Glucose, capillary     Status: Abnormal  Collection Time: 09/22/21  8:04 AM  Result Value Ref Range   Glucose-Capillary 189 (H) 70 - 99 mg/dL    Comment: Glucose reference range applies only to samples taken after fasting for at least 8 hours.    Blood Alcohol level:  Lab Results  Component Value Date   ETH <10 09/20/2021   ETH <10 44/81/8563    Metabolic Disorder Labs:  Lab Results  Component Value Date   HGBA1C 7.9 (H) 10/13/2020   MPG 180.03 10/13/2020   MPG 180.03 06/09/2020   No results found for: PROLACTIN Lab Results  Component Value Date   CHOL 267 (H) 10/13/2020   TRIG 180 (H) 10/13/2020   HDL 86 10/13/2020   CHOLHDL 3.1 10/13/2020   VLDL 36 10/13/2020   LDLCALC 145 (H) 10/13/2020   LDLCALC 158 (H) 02/29/2020    Current Medications: Current Facility-Administered Medications  Medication Dose Route Frequency Provider Last Rate Last Admin   acetaminophen (TYLENOL) tablet 650 mg  650 mg Oral Q6H PRN Sherlon Handing, NP       alum & mag hydroxide-simeth (MAALOX/MYLANTA) 200-200-20 MG/5ML suspension 30 mL  30 mL Oral Q4H PRN Sherlon Handing, NP       atorvastatin (LIPITOR) tablet 20 mg  20 mg Oral QHS Parks Ranger, DO       [START ON 09/23/2021] diltiazem (CARDIZEM CD)  24 hr capsule 120 mg  120 mg Oral Daily Parks Ranger, DO       escitalopram (LEXAPRO) tablet 10 mg  10 mg Oral Daily Parks Ranger, DO       feeding supplement (ENSURE SURGERY) liquid 237 mL  237 mL Oral BID BM Parks Ranger, DO       gabapentin (NEURONTIN) capsule 300 mg  300 mg Oral TID Parks Ranger, DO       insulin aspart (novoLOG) injection 0-5 Units  0-5 Units Subcutaneous QHS Caroline Sauger, NP   2 Units at 09/21/21 2149   insulin aspart (novoLOG) injection 0-9 Units  0-9 Units Subcutaneous TID WC Caroline Sauger, NP   2 Units at 09/22/21 0815   losartan (COZAAR) tablet 100 mg  100 mg Oral QHS Caroline Sauger, NP   100 mg at 09/21/21 2155   magnesium hydroxide (MILK OF MAGNESIA) suspension 30 mL  30 mL Oral Daily PRN Sherlon Handing, NP       paliperidone (INVEGA) 24 hr tablet 6 mg  6 mg Oral Daily Parks Ranger, DO   6 mg at 09/22/21 1497   pantoprazole (PROTONIX) EC tablet 40 mg  40 mg Oral Daily Parks Ranger, DO       traZODone (DESYREL) tablet 100 mg  100 mg Oral QHS Parks Ranger, DO       PTA Medications: Medications Prior to Admission  Medication Sig Dispense Refill Last Dose   ACCU-CHEK FASTCLIX LANCETS MISC       ACCU-CHEK SMARTVIEW test strip       acetaminophen (TYLENOL) 325 MG tablet Take 325 mg by mouth every 4 (four) hours as needed.      amLODipine (NORVASC) 10 MG tablet Take 1 tablet (10 mg total) by mouth daily. (Patient taking differently: Take 5 mg by mouth 2 (two) times daily.) 30 tablet 1    aspirin EC 81 MG EC tablet Take 1 tablet (81 mg total) by mouth daily. Swallow whole. 30 tablet 1    atorvastatin (LIPITOR) 80 MG tablet Take 0.5 tablets (40 mg  total) by mouth daily at 6 PM. 30 tablet 1    Baclofen 5 MG TABS Take 5 mg by mouth 2 (two) times daily.      Blood Glucose Monitoring Suppl (FIFTY50 GLUCOSE METER 2.0) W/DEVICE KIT Check blood sugar 3 times a day. Dx E11.9       gabapentin (NEURONTIN) 100 MG capsule Take 1 capsule (100 mg total) by mouth 3 (three) times daily. 90 capsule 2    gabapentin (NEURONTIN) 300 MG capsule Take 300 mg by mouth 2 (two) times daily.      hydrochlorothiazide (HYDRODIURIL) 12.5 MG tablet Take 12.5 mg by mouth daily. (Patient not taking: No sig reported)      hydrOXYzine (ATARAX/VISTARIL) 25 MG tablet Take 1 tablet (25 mg total) by mouth 3 (three) times daily as needed for anxiety. (Patient not taking: No sig reported) 30 tablet 0    INVEGA SUSTENNA injection Inject into the muscle.      Lancet Devices (RELION LANCING DEVICE) MISC       lidocaine (LIDODERM) 5 % Place 1 patch onto the skin every 12 (twelve) hours. Remove & Discard patch within 12 hours or as directed by MD (Patient not taking: Reported on 09/21/2021) 10 patch 0    loperamide (IMODIUM) 2 MG capsule Take 2 mg by mouth daily as needed.      losartan (COZAAR) 100 MG tablet Take 1 tablet (100 mg total) by mouth daily. 30 tablet 1    lurasidone (LATUDA) 40 MG TABS tablet Take 1 tablet (40 mg total) by mouth daily with breakfast. (Patient not taking: Reported on 09/21/2021) 30 tablet 0    metFORMIN (GLUCOPHAGE XR) 500 MG 24 hr tablet Take 2 tablets (1,000 mg total) by mouth daily with breakfast. (Patient not taking: Reported on 03/21/2021) 60 tablet 1    metFORMIN (GLUCOPHAGE) 500 MG tablet Take 500 mg by mouth 2 (two) times daily with a meal.      omeprazole (PRILOSEC) 20 MG capsule Take 1 capsule (20 mg total) by mouth daily. (Patient not taking: No sig reported) 30 capsule 1    ondansetron (ZOFRAN ODT) 4 MG disintegrating tablet Take 1 tablet (4 mg total) by mouth every 6 (six) hours as needed for nausea or vomiting. (Patient not taking: No sig reported) 20 tablet 0    oxybutynin (DITROPAN XL) 15 MG 24 hr tablet Take 1 tablet (15 mg total) by mouth at bedtime. 30 tablet 1    oxyCODONE-acetaminophen (PERCOCET/ROXICET) 5-325 MG tablet Take 1 tablet by mouth every 6 (six) hours as  needed for severe pain. 20 tablet 0    predniSONE (DELTASONE) 50 MG tablet Take 1 tablet (50 mg total) by mouth daily with breakfast. (Patient not taking: Reported on 09/21/2021) 5 tablet 0    sitaGLIPtin (JANUVIA) 100 MG tablet Take 1 tablet (100 mg total) by mouth daily. 30 tablet 1    traZODone (DESYREL) 150 MG tablet Take 150 mg by mouth at bedtime. (Patient not taking: Reported on 09/21/2021)      traZODone (DESYREL) 50 MG tablet Take 1 tablet (50 mg total) by mouth at bedtime. (Patient taking differently: Take 50-100 mg by mouth at bedtime.) 30 tablet 1     Musculoskeletal: Strength & Muscle Tone: within normal limits Gait & Station: normal Patient leans: N/A            Psychiatric Specialty Exam:  Presentation  General Appearance: Bizarre; Disheveled  Eye Contact:Fair  Speech:Garbled  Speech Volume:Decreased  Handedness:Right   Mood  and Affect  Mood:No data recorded Affect:Flat   Thought Process  Thought Processes:Disorganized  Duration of Psychotic Symptoms: Greater than six months  Past Diagnosis of Schizophrenia or Psychoactive disorder: Yes  Descriptions of Associations:Loose  Orientation:Partial  Thought Content:Illogical  Hallucinations:Hallucinations: Auditory  Ideas of Reference:No data recorded Suicidal Thoughts:Suicidal Thoughts: No  Homicidal Thoughts:Homicidal Thoughts: No   Sensorium  Memory:Immediate Poor  Judgment:Impaired  Insight:Lacking   Executive Functions  Concentration:Poor  Attention Span:Poor  Recall:Poor  Fund of Knowledge:Poor  Language:Poor   Psychomotor Activity  Psychomotor Activity:Psychomotor Activity: Decreased   Assets  Assets:Housing; Social Support   Sleep  Sleep:Sleep: Poor    Physical Exam: Physical Exam Constitutional:      Appearance: Normal appearance.  HENT:     Head: Normocephalic and atraumatic.     Mouth/Throat:     Pharynx: Oropharynx is clear.  Eyes:     Pupils:  Pupils are equal, round, and reactive to light.  Cardiovascular:     Rate and Rhythm: Normal rate and regular rhythm.  Pulmonary:     Effort: Pulmonary effort is normal.     Breath sounds: Normal breath sounds.  Abdominal:     General: Abdomen is flat.     Palpations: Abdomen is soft.  Musculoskeletal:        General: Swelling present. Normal range of motion.  Skin:    General: Skin is warm and dry.  Neurological:     General: No focal deficit present.     Mental Status: She is alert. Mental status is at baseline.  Psychiatric:        Attention and Perception: Attention and perception normal.        Mood and Affect: Mood is anxious and depressed. Affect is flat.        Speech: Speech normal.        Behavior: Behavior normal. Behavior is cooperative.        Thought Content: Thought content normal.        Cognition and Memory: Memory normal. Cognition is impaired.        Judgment: Judgment normal.   Review of Systems  Constitutional: Negative.   HENT: Negative.    Eyes: Negative.   Respiratory: Negative.    Cardiovascular: Negative.   Gastrointestinal: Negative.   Genitourinary: Negative.   Musculoskeletal:  Positive for myalgias.  Skin: Negative.   Neurological: Negative.   Endo/Heme/Allergies: Negative.   Psychiatric/Behavioral:  Positive for depression.   Blood pressure (!) 157/72, pulse 74, temperature 98 F (36.7 C), resp. rate 18, height '5\' 3"'  (1.6 m), weight 51.7 kg, SpO2 98 %. Body mass index is 20.19 kg/m.  Treatment Plan Summary: Daily contact with patient to assess and evaluate symptoms and progress in treatment, Medication management, and Plan restart home medications.  Observation Level/Precautions:  15 minute checks  Laboratory:  CBC Chemistry Profile HbAIC UDS UA  Psychotherapy:    Medications:    Consultations:    Discharge Concerns:    Estimated LOS:  Other:     Physician Treatment Plan for Primary Diagnosis: Schizoaffective disorder,  depressive type ([place]) Long Term Goal(s): Improvement in symptoms so as ready for discharge  Short Term Goals: Ability to identify changes in lifestyle to reduce recurrence of condition will improve, Ability to verbalize feelings will improve, Ability to disclose and discuss suicidal ideas, Ability to demonstrate self-control will improve, Ability to identify and develop effective coping behaviors will improve, Ability to maintain clinical measurements within normal limits will improve, Compliance with  prescribed medications will improve, and Ability to identify triggers associated with substance abuse/mental health issues will improve  Physician Treatment Plan for Secondary Diagnosis: Principal Problem:   Schizoaffective disorder, depressive type ([place])   I certify that inpatient services furnished can reasonably be expected to improve the patient's condition.    Parks Ranger, DO 2/21/202310:49 AM

## 2021-09-22 NOTE — Progress Notes (Signed)
Recreation Therapy Notes  Date: 09/22/2021   Time: 1:20 pm    Location:  Court yard   Behavioral response: N/A   Intervention Topic: Social Skills     Discussion/Intervention: Patient refused to attend group.   Clinical Observations/Feedback:  Patient refused to attend group.   Lynnda Wiersma LRT/CTRS        Bulah Lurie 09/22/2021 3:06 PM

## 2021-09-22 NOTE — Plan of Care (Signed)
Patient is alert and oriented with flat affect and disorganized thought process. Compliant and cooperative during assessment. Denies SI, HI, AVH. Denies pain or discomfort at this time. 2+ edema observed to left hand and BLE. Denies anxiety and depression. Ambulates around the unit ad lib. Compliant with all due medication. Ate meals in the day room among peers with good appetite. Remain safe on the unit with Q 15 minute safety check. Problem: Education: Goal: Knowledge of [facility] Education information/materials will improve Outcome: Progressing Goal: Emotional status will improve Outcome: Progressing Goal: Mental status will improve Outcome: Progressing Goal: Verbalization of understanding the information provided will improve Outcome: Progressing   Problem: Activity: Goal: Interest or engagement in activities will improve Outcome: Progressing Goal: Sleeping patterns will improve Outcome: Progressing   Problem: Coping: Goal: Ability to verbalize frustrations and anger appropriately will improve Outcome: Progressing Goal: Ability to demonstrate self-control will improve Outcome: Progressing   Problem: Health Behavior/Discharge Planning: Goal: Identification of resources available to assist in meeting health care needs will improve Outcome: Progressing Goal: Compliance with treatment plan for underlying cause of condition will improve Outcome: Progressing   Problem: Physical Regulation: Goal: Ability to maintain clinical measurements within normal limits will improve Outcome: Progressing   Problem: Safety: Goal: Periods of time without injury will increase Outcome: Progressing   Problem: Activity: Goal: Will verbalize the importance of balancing activity with adequate rest periods Outcome: Progressing   Problem: Education: Goal: Will be free of psychotic symptoms Outcome: Progressing Goal: Knowledge of the prescribed therapeutic regimen will improve Outcome:  Progressing   Problem: Coping: Goal: Coping ability will improve Outcome: Progressing Goal: Will verbalize feelings Outcome: Progressing   Problem: Health Behavior/Discharge Planning: Goal: Compliance with prescribed medication regimen will improve Outcome: Progressing   Problem: Nutritional: Goal: Ability to achieve adequate nutritional intake will improve Outcome: Progressing   Problem: Role Relationship: Goal: Ability to communicate needs accurately will improve Outcome: Progressing Goal: Ability to interact with others will improve Outcome: Progressing   Problem: Safety: Goal: Ability to redirect hostility and anger into socially appropriate behaviors will improve Outcome: Progressing Goal: Ability to remain free from injury will improve Outcome: Progressing   Problem: Self-Care: Goal: Ability to participate in self-care as condition permits will improve Outcome: Progressing   Problem: Self-Concept: Goal: Will verbalize positive feelings about self Outcome: Progressing

## 2021-09-23 DIAGNOSIS — F251 Schizoaffective disorder, depressive type: Secondary | ICD-10-CM | POA: Diagnosis not present

## 2021-09-23 LAB — GLUCOSE, CAPILLARY
Glucose-Capillary: 182 mg/dL — ABNORMAL HIGH (ref 70–99)
Glucose-Capillary: 202 mg/dL — ABNORMAL HIGH (ref 70–99)
Glucose-Capillary: 117 mg/dL — ABNORMAL HIGH (ref 70–99)
Glucose-Capillary: 207 mg/dL — ABNORMAL HIGH (ref 70–99)

## 2021-09-23 NOTE — BH IP Treatment Plan (Signed)
Interdisciplinary Treatment and Diagnostic Plan Update  09/23/2021 Time of Session: 12:00PM VENETTA KNEE MRN: [id number]  Principal Diagnosis: Schizoaffective disorder, depressive type ([place])  Secondary Diagnoses: Principal Problem:   Schizoaffective disorder, depressive type ([place])   Current Medications:  Current Facility-Administered Medications  Medication Dose Route Frequency Provider Last Rate Last Admin   acetaminophen (TYLENOL) tablet 650 mg  650 mg Oral Q6H PRN Sherlon Handing, NP   650 mg at 09/22/21 1330   alum & mag hydroxide-simeth (MAALOX/MYLANTA) 200-200-20 MG/5ML suspension 30 mL  30 mL Oral Q4H PRN Waldon Merl F, NP       atorvastatin (LIPITOR) tablet 20 mg  20 mg Oral QHS Parks Ranger, DO   20 mg at 09/22/21 2131   diltiazem (CARDIZEM CD) 24 hr capsule 120 mg  120 mg Oral Daily Parks Ranger, DO   120 mg at 09/23/21 0931   escitalopram (LEXAPRO) tablet 10 mg  10 mg Oral Daily Parks Ranger, DO   10 mg at 09/23/21 0931   feeding supplement (GLUCERNA SHAKE) (GLUCERNA SHAKE) liquid 237 mL  237 mL Oral TID BM Parks Ranger, DO   237 mL at 09/23/21 0933   gabapentin (NEURONTIN) capsule 300 mg  300 mg Oral TID Parks Ranger, DO   300 mg at 09/23/21 0931   insulin aspart (novoLOG) injection 0-5 Units  0-5 Units Subcutaneous QHS Caroline Sauger, NP   4 Units at 09/22/21 2138   insulin aspart (novoLOG) injection 0-9 Units  0-9 Units Subcutaneous TID WC Caroline Sauger, NP   2 Units at 09/23/21 0844   losartan (COZAAR) tablet 100 mg  100 mg Oral QHS Caroline Sauger, NP   100 mg at 09/22/21 2130   magnesium hydroxide (MILK OF MAGNESIA) suspension 30 mL  30 mL Oral Daily PRN Sherlon Handing, NP       paliperidone (INVEGA) 24 hr tablet 6 mg  6 mg Oral Daily Parks Ranger, DO   6 mg at 09/23/21 0930   pantoprazole (PROTONIX) EC tablet 40 mg  40 mg Oral Daily Parks Ranger, DO   40 mg at  09/23/21 0931   traZODone (DESYREL) tablet 100 mg  100 mg Oral QHS Parks Ranger, DO   100 mg at 09/22/21 2131   PTA Medications: Medications Prior to Admission  Medication Sig Dispense Refill Last Dose   ACCU-CHEK FASTCLIX LANCETS MISC       ACCU-CHEK SMARTVIEW test strip       acetaminophen (TYLENOL) 325 MG tablet Take 325 mg by mouth every 4 (four) hours as needed.      amLODipine (NORVASC) 10 MG tablet Take 1 tablet (10 mg total) by mouth daily. (Patient taking differently: Take 5 mg by mouth 2 (two) times daily.) 30 tablet 1    aspirin EC 81 MG EC tablet Take 1 tablet (81 mg total) by mouth daily. Swallow whole. 30 tablet 1    atorvastatin (LIPITOR) 80 MG tablet Take 0.5 tablets (40 mg total) by mouth daily at 6 PM. 30 tablet 1    Baclofen 5 MG TABS Take 5 mg by mouth 2 (two) times daily.      Blood Glucose Monitoring Suppl (FIFTY50 GLUCOSE METER 2.0) W/DEVICE KIT Check blood sugar 3 times a day. Dx E11.9      gabapentin (NEURONTIN) 100 MG capsule Take 1 capsule (100 mg total) by mouth 3 (three) times daily. 90 capsule 2    gabapentin (NEURONTIN) 300 MG capsule  Take 300 mg by mouth 2 (two) times daily.      hydrochlorothiazide (HYDRODIURIL) 12.5 MG tablet Take 12.5 mg by mouth daily. (Patient not taking: No sig reported)      hydrOXYzine (ATARAX/VISTARIL) 25 MG tablet Take 1 tablet (25 mg total) by mouth 3 (three) times daily as needed for anxiety. (Patient not taking: No sig reported) 30 tablet 0    INVEGA SUSTENNA injection Inject into the muscle.      Lancet Devices (RELION LANCING DEVICE) MISC       lidocaine (LIDODERM) 5 % Place 1 patch onto the skin every 12 (twelve) hours. Remove & Discard patch within 12 hours or as directed by MD (Patient not taking: Reported on 09/21/2021) 10 patch 0    loperamide (IMODIUM) 2 MG capsule Take 2 mg by mouth daily as needed.      losartan (COZAAR) 100 MG tablet Take 1 tablet (100 mg total) by mouth daily. 30 tablet 1    lurasidone (LATUDA)  40 MG TABS tablet Take 1 tablet (40 mg total) by mouth daily with breakfast. (Patient not taking: Reported on 09/21/2021) 30 tablet 0    metFORMIN (GLUCOPHAGE XR) 500 MG 24 hr tablet Take 2 tablets (1,000 mg total) by mouth daily with breakfast. (Patient not taking: Reported on 03/21/2021) 60 tablet 1    metFORMIN (GLUCOPHAGE) 500 MG tablet Take 500 mg by mouth 2 (two) times daily with a meal.      omeprazole (PRILOSEC) 20 MG capsule Take 1 capsule (20 mg total) by mouth daily. (Patient not taking: No sig reported) 30 capsule 1    ondansetron (ZOFRAN ODT) 4 MG disintegrating tablet Take 1 tablet (4 mg total) by mouth every 6 (six) hours as needed for nausea or vomiting. (Patient not taking: No sig reported) 20 tablet 0    oxybutynin (DITROPAN XL) 15 MG 24 hr tablet Take 1 tablet (15 mg total) by mouth at bedtime. 30 tablet 1    oxyCODONE-acetaminophen (PERCOCET/ROXICET) 5-325 MG tablet Take 1 tablet by mouth every 6 (six) hours as needed for severe pain. 20 tablet 0    predniSONE (DELTASONE) 50 MG tablet Take 1 tablet (50 mg total) by mouth daily with breakfast. (Patient not taking: Reported on 09/21/2021) 5 tablet 0    sitaGLIPtin (JANUVIA) 100 MG tablet Take 1 tablet (100 mg total) by mouth daily. 30 tablet 1    traZODone (DESYREL) 150 MG tablet Take 150 mg by mouth at bedtime. (Patient not taking: Reported on 09/21/2021)      traZODone (DESYREL) 50 MG tablet Take 1 tablet (50 mg total) by mouth at bedtime. (Patient taking differently: Take 50-100 mg by mouth at bedtime.) 30 tablet 1     Patient Stressors: Health problems    Patient Strengths:    Treatment Modalities: Medication Management, Group therapy, Case management,  1 to 1 session with clinician, Psychoeducation, Recreational therapy.   Physician Treatment Plan for Primary Diagnosis: Schizoaffective disorder, depressive type ([place]) Long Term Goal(s): Improvement in symptoms so as ready for discharge   Short Term Goals: Ability to identify  changes in lifestyle to reduce recurrence of condition will improve Ability to verbalize feelings will improve Ability to disclose and discuss suicidal ideas Ability to demonstrate self-control will improve Ability to identify and develop effective coping behaviors will improve Ability to maintain clinical measurements within normal limits will improve Compliance with prescribed medications will improve Ability to identify triggers associated with substance abuse/mental health issues will improve  Medication Management:  Evaluate patient's response, side effects, and tolerance of medication regimen.  Therapeutic Interventions: 1 to 1 sessions, Unit Group sessions and Medication administration.  Evaluation of Outcomes: Not Met  Physician Treatment Plan for Secondary Diagnosis: Principal Problem:   Schizoaffective disorder, depressive type ([place])  Long Term Goal(s): Improvement in symptoms so as ready for discharge   Short Term Goals: Ability to identify changes in lifestyle to reduce recurrence of condition will improve Ability to verbalize feelings will improve Ability to disclose and discuss suicidal ideas Ability to demonstrate self-control will improve Ability to identify and develop effective coping behaviors will improve Ability to maintain clinical measurements within normal limits will improve Compliance with prescribed medications will improve Ability to identify triggers associated with substance abuse/mental health issues will improve     Medication Management: Evaluate patient's response, side effects, and tolerance of medication regimen.  Therapeutic Interventions: 1 to 1 sessions, Unit Group sessions and Medication administration.  Evaluation of Outcomes: Not Met   RN Treatment Plan for Primary Diagnosis: Schizoaffective disorder, depressive type ([place]) Long Term Goal(s): Knowledge of disease and therapeutic regimen to maintain health will improve  Short Term Goals:  Ability to remain free from injury will improve, Ability to demonstrate self-control, Ability to participate in decision making will improve, Ability to verbalize feelings will improve, Ability to identify and develop effective coping behaviors will improve, and Compliance with prescribed medications will improve  Medication Management: RN will administer medications as ordered by provider, will assess and evaluate patient's response and provide education to patient for prescribed medication. RN will report any adverse and/or side effects to prescribing provider.  Therapeutic Interventions: 1 on 1 counseling sessions, Psychoeducation, Medication administration, Evaluate responses to treatment, Monitor vital signs and CBGs as ordered, Perform/monitor CIWA, COWS, AIMS and Fall Risk screenings as ordered, Perform wound care treatments as ordered.  Evaluation of Outcomes: Not Met   LCSW Treatment Plan for Primary Diagnosis: Schizoaffective disorder, depressive type ([place]) Long Term Goal(s): Safe transition to appropriate next level of care at discharge, Engage patient in therapeutic group addressing interpersonal concerns.  Short Term Goals: Engage patient in aftercare planning with referrals and resources, Increase social support, Increase ability to appropriately verbalize feelings, Increase emotional regulation, Facilitate acceptance of mental health diagnosis and concerns, and Increase skills for wellness and recovery  Therapeutic Interventions: Assess for all discharge needs, 1 to 1 time with Social worker, Explore available resources and support systems, Assess for adequacy in community support network, Educate family and significant other(s) on suicide prevention, Complete Psychosocial Assessment, Interpersonal group therapy.  Evaluation of Outcomes: Not Met   Progress in Treatment: Attending groups: No. Participating in groups: No. Taking medication as prescribed: Yes. Toleration  medication: Yes. Family/Significant other contact made: No, will contact:  when given permission Patient understands diagnosis: No. Discussing patient identified problems/goals with staff: No. Medical problems stabilized or resolved: Yes. Denies suicidal/homicidal ideation: Yes. Issues/concerns per patient self-inventory: No. Other: None  New problem(s) identified: No, Describe:  None  New Short Term/Long Term Goal(s): Patient to work towards elimination of symptoms of psychosis, medication management for mood stabilization; development of comprehensive mental wellness plan.   Patient Goals:  Patient to work towards elimination of symptoms of psychosis, medication management for mood stabilization; development of comprehensive mental wellness plan.   Discharge Plan or Barriers: CSW will assist pt with development of aftercare/discharge plan.   Reason for Continuation of Hospitalization: Hallucinations Medication stabilization  Estimated Length of Stay: TBD   Scribe for Treatment Team:  Ancelmo Hunt A [place], LCSWA 09/23/2021 12:24 PM

## 2021-09-23 NOTE — BHH Counselor (Signed)
Adult Comprehensive Assessment  Patient ID: Alicia Rogers, female   DOB: [birth year], 61 y.o.   MRN: [id number]  Information Source: Information source: Patient  Current Stressors:  Patient states their primary concerns and needs for treatment are:: "brother dropped me off, don't know why" Per chart, states [place] police department dropped off pt Patient states their goals for this hospitilization and ongoing recovery are:: "my hand issues" Educational / Learning stressors: Pt denies Employment / Job issues: Pt denies Family Relationships: Pt states she is having some issues living with her brother Museum/gallery curator / Lack of resources (include bankruptcy): Pt states that someone is taking her money Housing / Lack of housing: Pt states she not sure she wants to return to her brother's home Physical health (include injuries & life threatening diseases): Pt has swelling in her left hand Social relationships: Pt denies Substance abuse: "smoke cigarettes, but haven't in a while" Bereavement / Loss: "mom and dad, December and February of last year"  Living/Environment/Situation:  Living Arrangements: Other relatives (Brother) Living conditions (as described by patient or guardian): Pt states too many people are in and out of her brother's house How long has patient lived in current situation?: 5 months What is atmosphere in current home: Chaotic  Family History:  Marital status: Single Are you sexually active?:  (unable to assess) What is your sexual orientation?: unable to assess Has your sexual activity been affected by drugs, alcohol, medication, or emotional stress?: unable to assess Does patient have children?: No  Childhood History:  By whom was/is the patient raised?: Both parents Additional childhood history information: "it was like a family family" Description of patient's relationship with caregiver when they were a child: Pt states she had a good relationship with her  parents Patient's description of current relationship with people who raised him/her: Deceased How were you disciplined when you got in trouble as a child/adolescent?: "i got a couple of woopings" Does patient have siblings?: Yes Number of Siblings: 65 (3 brothers, 1 sister) Description of patient's current relationship with siblings: Pt states she has an ok relationship with her siblings, and one of her brothers is deceased Did patient suffer any verbal/emotional/physical/sexual abuse as a child?: No Did patient suffer from severe childhood neglect?: No Has patient ever been sexually abused/assaulted/raped as an adolescent or adult?: No Was the patient ever a victim of a crime or a disaster?:  (Pt states that someone is taking her money) Witnessed domestic violence?: No Has patient been affected by domestic violence as an adult?: No  Education:  Highest grade of school patient has completed: 2 masters, "business and business management" Currently a student?: No Learning disability?: No  Employment/Work Situation:   Employment Situation: On disability Why is Patient on Disability: mental health diagnosis How Long has Patient Been on Disability: unable to assess Patient's Job has Been Impacted by Current Illness: No What is the Longest Time Patient has Held a Job?: 5 years 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 Has Patient ever Been in the [employer]?: No  Financial Resources:   Museum/gallery curator resources: Teacher, early years/pre, Medicare Does patient have a Programmer, applications or guardian?: No  Alcohol/Substance Abuse:   What has been your use of drugs/alcohol within the last 12 months?: Pt states that she used to smoke cigarettes but does not smoke much anymore If attempted suicide, did drugs/alcohol play a role in this?: No Alcohol/Substance Abuse Treatment Hx: Denies past history Has alcohol/substance abuse ever caused legal problems?: No  Social Support System:    Heritage manager System: None Describe Community Support System: "nobody helps me, I help myself" Type of faith/religion: Baptist How does patient's faith help to cope with current illness?: "have my Bible"  Leisure/Recreation:   Do You Have Hobbies?: Yes Leisure and Hobbies: Pt states she used to get her hair and nails done  Strengths/Needs:   What is the patient's perception of their strengths?: unable to assess Patient states they can use these personal strengths during their treatment to contribute to their recovery: unable to assess Patient states these barriers may affect/interfere with their treatment: Pt denies Patient states these barriers may affect their return to the community: Pt states that "my brother doesn't like me right now" Other important information patient would like considered in planning for their treatment: Pt denies  Discharge Plan:   Currently receiving community mental health services: No Patient states concerns and preferences for aftercare planning are: Pt states she is unsure what she wants for follow up care Patient states they will know when they are safe and ready for discharge when: Pt states she does not know Does patient have access to transportation?: Yes Does patient have financial barriers related to discharge medications?: No Plan for living situation after discharge: CSW will assist pt in discharge planning Will patient be returning to same living situation after discharge?: Yes (Pt is not sure she can return home to her brother's but that her sister will assist her in finding housing)  Summary/Recommendations:   Summary and Recommendations (to be completed by the evaluator): Patient is a 61 year-old female, single, [place] [facility] [place] [place] She reports that she receives SSDI and is currently unemployed. She presents to the hospital involuntarily following family member, claiming pt has not been taking her psychiatric  medications and is experiencing auditory hallucinations. Recent stressors include onset of depressive symptoms of increased appetite, inability to sleep, and familial strain with her brother. She has a primary diagnosis of Schizoaffective disorder, depressive type. She states she does not want to go back to her brother's home and that her sister will assist her in finding temporary housing. Recommendations include: crisis stabilization, therapeutic milieu, encourage group attendance and participation, medication management for mood stabilization and development of comprehensive mental wellness plan.  Shirlee Whitmire A Martin[place]. 09/23/2021

## 2021-09-23 NOTE — Progress Notes (Signed)
Pt in room sleeping at the beginning of the shift. Pt later came to the dayroom for Pm VS and snacks. Pt declined all her oral medications for tonight. Pt held the medication cup in her hands for a long time, staring at the medications. When asked if she had any concerns, the patient reported that she did not take this much medications at home, and her blood pressure medicines at home was different from what she is seeing. Pt education given that medications come in different colors and sizes depending on the dose. Pt education given regarding the risks and benefits of medication compliance/non-compliance, in addition to adhering to her plan of care.  Pt verbalizes her understanding. Pt encouraged to discuss all concerns related to medications with her doctor in the am.  Pt currently in the dayroom eating snacks.

## 2021-09-23 NOTE — Plan of Care (Incomplete)
Patient is alert and oriented with flat affect and disorganized thought process. Compliant and cooperative during assessment. Denies SI, HI, AVH. Denies pain or discomfort at this time. 2+ edema observed to left hand and BLE. Denies anxiety and depression. Ambulates around the unit ad lib. Compliant with all due medication. Ate meals in the day room among peers with good appetite. Remain safe on the unit with Q 15 minute safety check.  Problem: Education: Goal: Knowledge of [place] General Education information/materials will improve Outcome: Progressing Goal: Emotional status will improve Outcome: Progressing Goal: Mental status will improve Outcome: Progressing Goal: Verbalization of understanding the information provided will improve Outcome: Progressing   Problem: Activity: Goal: Interest or engagement in activities will improve Outcome: Progressing Goal: Sleeping patterns will improve Outcome: Progressing   Problem: Coping: Goal: Ability to verbalize frustrations and anger appropriately will improve Outcome: Progressing Goal: Ability to demonstrate self-control will improve Outcome: Progressing   Problem: Health Behavior/Discharge Planning: Goal: Identification of resources available to assist in meeting health care needs will improve Outcome: Progressing Goal: Compliance with treatment plan for underlying cause of condition will improve Outcome: Progressing   Problem: Physical Regulation: Goal: Ability to maintain clinical measurements within normal limits will improve Outcome: Progressing   Problem: Safety: Goal: Periods of time without injury will increase Outcome: Progressing   Problem: Activity: Goal: Will verbalize the importance of balancing activity with adequate rest periods Outcome: Progressing   Problem: Education: Goal: Will be free of psychotic symptoms Outcome: Progressing Goal: Knowledge of the prescribed therapeutic regimen will improve Outcome:  Progressing   Problem: Coping: Goal: Coping ability will improve Outcome: Progressing Goal: Will verbalize feelings Outcome: Progressing   Problem: Health Behavior/Discharge Planning: Goal: Compliance with prescribed medication regimen will improve Outcome: Progressing   Problem: Nutritional: Goal: Ability to achieve adequate nutritional intake will improve Outcome: Progressing   Problem: Role Relationship: Goal: Ability to communicate needs accurately will improve Outcome: Progressing Goal: Ability to interact with others will improve Outcome: Progressing   Problem: Safety: Goal: Ability to redirect hostility and anger into socially appropriate behaviors will improve Outcome: Progressing Goal: Ability to remain free from injury will improve Outcome: Progressing   Problem: Self-Care: Goal: Ability to participate in self-care as condition permits will improve Outcome: Progressing   Problem: Self-Concept: Goal: Will verbalize positive feelings about self Outcome: Progressing

## 2021-09-23 NOTE — BHH Suicide Risk Assessment (Signed)
[facility] INPATIENT:  Family/Significant Other Suicide Prevention Education  Suicide Prevention Education:  Contact Attempts: [name] Holleran, (name of family member/significant other) has been identified by the patient as the family member/significant other with whom the patient will be residing, and identified as the person(s) who will aid the patient in the event of a mental health crisis.  With written consent from the patient, two attempts were made to provide suicide prevention education, prior to and/or following the patient's discharge.  We were unsuccessful in providing suicide prevention education.  A suicide education pamphlet was given to the patient to share with family/significant other.  Date and time of first attempt:09/23/21/2:37PM Date and time of second attempt:   Alicia Rogers 09/23/2021, 2:37 PM

## 2021-09-23 NOTE — Progress Notes (Signed)
[facility] MD Progress Note  09/23/2021 12:31 PM Alicia Rogers  MRN:  [id number] Subjective: Alicia Rogers is seen and examined today.  She states that her left arm has been deformed and swollen since September.  She not really able to tell me why.  She is a poor historian.  She came from her brothers house.  Apparently, he took her out of assisted living and moved her in with him but figured he was unable to care for her son brought her to the ER.  She states he is stealing his money.  She does not want [place] to talk to him.  She states that she is closer to her sister and we are able to talk to her.  She is back on her medications and taking them as prescribed.  She denies any side effects.  Principal Problem: Schizoaffective disorder, depressive type ([place]) Diagnosis: Principal Problem:   Schizoaffective disorder, depressive type ([place])  Total Time spent with patient: 15 minutes  Past Psychiatric History: Yes  Past Medical History:  Past Medical History:  Diagnosis Date   Depression    Diabetes mellitus without complication ([place])    Diabetes mellitus, type II ([place])    Hypertension    Schizophrenia ([place])    History reviewed. No pertinent surgical history. Family History:  Family History  Problem Relation Age of Onset   Diabetes Mother    Glaucoma Mother    Heart disease Mother    Heart attack Mother    Depression Mother    Hypertension Mother    Diabetes Father    Glaucoma Father    Hypertension Father    Diabetes Sister    Depression Sister    Post-traumatic stress disorder Sister     Social History:  Social History   Substance and Sexual Activity  Alcohol Use No   Alcohol/week: 0.0 standard drinks     Social History   Substance and Sexual Activity  Drug Use No    Social History   Socioeconomic History   Marital status: Single    Spouse name: Not on file   Number of children: Not on file   Years of education: Not on file   Highest education level: Not on file   Occupational History   Not on file  Tobacco Use   Smoking status: Every Day    Packs/day: 1.00    Types: Cigarettes    Start date: 04/21/1978   Smokeless tobacco: Never  Vaping Use   Vaping Use: Never used  Substance and Sexual Activity   Alcohol use: No    Alcohol/week: 0.0 standard drinks   Drug use: No   Sexual activity: Never  Other Topics Concern   Not on file  Social History Narrative   Not on file   Social Determinants of Health   Financial Resource Strain: Not on file  Food Insecurity: Not on file  Transportation Needs: Not on file  Physical Activity: Not on file  Stress: Not on file  Social Connections: Not on file   Additional Social History:                         Sleep: Good  Appetite:  Fair  Current Medications: Current Facility-Administered Medications  Medication Dose Route Frequency Provider Last Rate Last Admin   acetaminophen (TYLENOL) tablet 650 mg  650 mg Oral Q6H PRN Waldon Merl F, NP   650 mg at 09/22/21 1330   alum & mag hydroxide-simeth (MAALOX/MYLANTA)  200-200-20 MG/5ML suspension 30 mL  30 mL Oral Q4H PRN Waldon Merl F, NP       atorvastatin (LIPITOR) tablet 20 mg  20 mg Oral QHS Parks Ranger, DO   20 mg at 09/22/21 2131   diltiazem (CARDIZEM CD) 24 hr capsule 120 mg  120 mg Oral Daily Parks Ranger, DO   120 mg at 09/23/21 0931   escitalopram (LEXAPRO) tablet 10 mg  10 mg Oral Daily Parks Ranger, DO   10 mg at 09/23/21 8338   feeding supplement (GLUCERNA SHAKE) (GLUCERNA SHAKE) liquid 237 mL  237 mL Oral TID BM Parks Ranger, DO   237 mL at 09/23/21 2505   gabapentin (NEURONTIN) capsule 300 mg  300 mg Oral TID Parks Ranger, DO   300 mg at 09/23/21 3976   insulin aspart (novoLOG) injection 0-5 Units  0-5 Units Subcutaneous QHS Caroline Sauger, NP   4 Units at 09/22/21 2138   insulin aspart (novoLOG) injection 0-9 Units  0-9 Units Subcutaneous TID WC Caroline Sauger, NP   2 Units at 09/23/21 0844   losartan (COZAAR) tablet 100 mg  100 mg Oral QHS Caroline Sauger, NP   100 mg at 09/22/21 2130   magnesium hydroxide (MILK OF MAGNESIA) suspension 30 mL  30 mL Oral Daily PRN Sherlon Handing, NP       paliperidone (INVEGA) 24 hr tablet 6 mg  6 mg Oral Daily Parks Ranger, DO   6 mg at 09/23/21 0930   pantoprazole (PROTONIX) EC tablet 40 mg  40 mg Oral Daily Parks Ranger, DO   40 mg at 09/23/21 7341   traZODone (DESYREL) tablet 100 mg  100 mg Oral QHS Parks Ranger, DO   100 mg at 09/22/21 2131    Lab Results:  Results for orders placed or performed during the hospital encounter of 09/21/21 (from the past 48 hour(s))  Glucose, capillary     Status: Abnormal   Collection Time: 09/21/21  4:23 PM  Result Value Ref Range   Glucose-Capillary 165 (H) 70 - 99 mg/dL    Comment: Glucose reference range applies only to samples taken after fasting for at least 8 hours.  Glucose, capillary     Status: Abnormal   Collection Time: 09/21/21  7:28 PM  Result Value Ref Range   Glucose-Capillary 228 (H) 70 - 99 mg/dL    Comment: Glucose reference range applies only to samples taken after fasting for at least 8 hours.   Comment 1 Notify RN   Glucose, capillary     Status: Abnormal   Collection Time: 09/22/21  8:04 AM  Result Value Ref Range   Glucose-Capillary 189 (H) 70 - 99 mg/dL    Comment: Glucose reference range applies only to samples taken after fasting for at least 8 hours.  Glucose, capillary     Status: Abnormal   Collection Time: 09/22/21 11:39 AM  Result Value Ref Range   Glucose-Capillary 210 (H) 70 - 99 mg/dL    Comment: Glucose reference range applies only to samples taken after fasting for at least 8 hours.  Glucose, capillary     Status: Abnormal   Collection Time: 09/22/21  4:22 PM  Result Value Ref Range   Glucose-Capillary 174 (H) 70 - 99 mg/dL    Comment: Glucose reference range applies only to  samples taken after fasting for at least 8 hours.  Glucose, capillary     Status: Abnormal   Collection  Time: 09/22/21  9:20 PM  Result Value Ref Range   Glucose-Capillary 324 (H) 70 - 99 mg/dL    Comment: Glucose reference range applies only to samples taken after fasting for at least 8 hours.   Comment 1 Notify RN   Glucose, capillary     Status: Abnormal   Collection Time: 09/23/21  7:52 AM  Result Value Ref Range   Glucose-Capillary 182 (H) 70 - 99 mg/dL    Comment: Glucose reference range applies only to samples taken after fasting for at least 8 hours.  Glucose, capillary     Status: Abnormal   Collection Time: 09/23/21 11:52 AM  Result Value Ref Range   Glucose-Capillary 202 (H) 70 - 99 mg/dL    Comment: Glucose reference range applies only to samples taken after fasting for at least 8 hours.    Blood Alcohol level:  Lab Results  Component Value Date   ETH <10 09/20/2021   ETH <10 40/98/1191    Metabolic Disorder Labs: Lab Results  Component Value Date   HGBA1C 7.9 (H) 10/13/2020   MPG 180.03 10/13/2020   MPG 180.03 06/09/2020   No results found for: PROLACTIN Lab Results  Component Value Date   CHOL 267 (H) 10/13/2020   TRIG 180 (H) 10/13/2020   HDL 86 10/13/2020   CHOLHDL 3.1 10/13/2020   VLDL 36 10/13/2020   LDLCALC 145 (H) 10/13/2020   LDLCALC 158 (H) 02/29/2020    Physical Findings: AIMS:  , ,  ,  ,    CIWA:    COWS:     Musculoskeletal: Strength & Muscle Tone: abnormal Gait & Station: unsteady Patient leans: N/A  Psychiatric Specialty Exam:  Presentation  General Appearance: Bizarre; Disheveled  Eye Contact:Fair  Speech:Garbled  Speech Volume:Decreased  Handedness:Right   Mood and Affect  Mood:No data recorded Affect:Flat   Thought Process  Thought Processes:Disorganized  Descriptions of Associations:Loose  Orientation:Partial  Thought Content:Illogical  History of Schizophrenia/Schizoaffective disorder:Yes  Duration of  Psychotic Symptoms:Greater than six months  Hallucinations:No data recorded Ideas of Reference:No data recorded Suicidal Thoughts:No data recorded Homicidal Thoughts:No data recorded  Sensorium  Memory:Immediate Poor  Judgment:Impaired  Insight:Lacking   Executive Functions  Concentration:Poor  Attention Span:Poor  Recall:Poor  Fund of Knowledge:Poor  Language:Poor   Psychomotor Activity  Psychomotor Activity:No data recorded  Assets  Assets:Housing; Social Support   Sleep  Sleep:No data recorded   Physical Exam: Physical Exam Vitals and nursing note reviewed.  Constitutional:      Appearance: Normal appearance. She is normal weight.  Musculoskeletal:        General: Swelling and deformity present.  Neurological:     General: No focal deficit present.     Mental Status: She is alert and oriented to person, place, and time.  Psychiatric:        Attention and Perception: Attention and perception normal.        Mood and Affect: Mood normal. Affect is flat.        Behavior: Behavior normal. Behavior is cooperative.        Thought Content: Thought content normal.        Cognition and Memory: Cognition is impaired. Memory is impaired.        Judgment: Judgment is inappropriate.   Review of Systems  Constitutional: Negative.   HENT: Negative.    Eyes: Negative.   Respiratory: Negative.    Cardiovascular: Negative.   Gastrointestinal: Negative.   Genitourinary: Negative.   Musculoskeletal: Negative.  Skin: Negative.   Neurological: Negative.   Endo/Heme/Allergies: Negative.   Psychiatric/Behavioral:  Positive for depression and hallucinations.   Blood pressure (!) 158/69, pulse 63, temperature 97.7 F (36.5 C), temperature source Oral, resp. rate 16, height 5\' 3"  (1.6 m), weight 51.7 kg, SpO2 100 %. Body mass index is 20.19 kg/m.   Treatment Plan Summary: Daily contact with patient to assess and evaluate symptoms and progress in treatment,  Medication management, and Plan diabetic consult diabetic consult  Parks Ranger, DO 09/23/2021, 12:31 PM

## 2021-09-23 NOTE — Progress Notes (Signed)
Inpatient Diabetes Program Recommendations  AACE/ADA: New Consensus Statement on Inpatient Glycemic Control (2015)  Target Ranges:  Prepandial:   less than 140 mg/dL      Peak postprandial:   less than 180 mg/dL (1-2 hours)      Critically ill patients:  140 - 180 mg/dL   Lab Results  Component Value Date   GLUCAP 182 (H) 09/23/2021   HGBA1C 7.9 (H) 10/13/2020    Review of Glycemic Control  Latest Reference Range & Units 09/22/21 11:39 09/22/21 16:22 09/22/21 21:20 09/23/21 07:52  Glucose-Capillary 70 - 99 mg/dL 210 (H) 174 (H) 324 (H) 182 (H)  (H): Data is abnormally high Diabetes history: Type 2 DM Outpatient Diabetes medications: Januvia 100 mg QD, Metformin 500 mg BID Current orders for Inpatient glycemic control: Novolog 0-9 units TID & HS  Inpatient Diabetes Program Recommendations:    Consider adding: - Metformin 500 mg BID - Adding Semglee 8 units Qd.  Thanks, Bronson Curb, MSN, RNC-OB Diabetes Coordinator [phone] (8a-5p)

## 2021-09-23 NOTE — Progress Notes (Signed)
Recreation Therapy Notes  Date: 09/23/2021   Time: 1:30 pm    Location:  Court yard   Behavioral response: N/A   Intervention Topic: Leisure      Discussion/Intervention: Patient refused to attend group.   Clinical Observations/Feedback:  Patient refused to attend group.   Grisell Bissette LRT/CTRS        Azavier Creson 09/23/2021 2:18 PM

## 2021-09-24 LAB — GLUCOSE, CAPILLARY
Glucose-Capillary: 164 mg/dL — ABNORMAL HIGH (ref 70–99)
Glucose-Capillary: 170 mg/dL — ABNORMAL HIGH (ref 70–99)
Glucose-Capillary: 208 mg/dL — ABNORMAL HIGH (ref 70–99)
Glucose-Capillary: 141 mg/dL — ABNORMAL HIGH (ref 70–99)

## 2021-09-24 MED ORDER — ZINC OXIDE 20 % EX OINT
TOPICAL_OINTMENT | CUTANEOUS | Status: DC
  Administered 2021-09-24 – 2021-09-25 (×3): 1 via TOPICAL
  Filled 2021-09-24: qty 28.35

## 2021-09-24 NOTE — BHH Counselor (Signed)
°  CSW spoke with Alicia Rogers, [phone]'s sister. Her sister stated that she was at 2 different group homes/family care homes before moving to her brother's house. Patient had to leave care homes due to elopement and not taking her medications. Alicia Rogers states brother and sister have been taking care of pt and pt has the wrap around services she needs but has decompensated due to noncompliance with diabetes and psych medication and not telling family about her her appointments or care needs.   She stated that pt has called the police randomly at night, would get rides with cab companies to random places and spend money that she does not have. Pt states she believes pt needs higher level of care.   Patient has case worker from [phone]. CSW will follow up to discuss discharge planning and get collateral information.   She stated pt also has a primary care provider in [place], but cannot recall the provider's name. She stated that she had a nurse, Patrina Levering, nurse, [phone] coming to pt's home and giving medication injections but pt would not comply.  She stated she wanted pt to improve medication consistency and verbalizing needs.    No other requests were made. Conversation ended without incident. Alicia Rogers, MSW, LCSW-A  2/23/20234:36 PM

## 2021-09-24 NOTE — Progress Notes (Signed)
[facility] MD Progress Note  09/24/2021 12:04 PM Alicia Rogers  MRN:  [id number] Subjective: Alicia Rogers is a little more paranoid.  She has been questioning her medications but does take them.  Social work is working on where she is going to go since her brother dropped her off at the ER.  She will not answer the question whether she is suicidal or hearing voices.  Her blood sugars have improved and we will check another CMP to see if her GFR is improved to start metformin.  Other than that, she has been pleasant and cooperative.  Principal Problem: Schizoaffective disorder, depressive type ([place]) Diagnosis: Principal Problem:   Schizoaffective disorder, depressive type ([place])  Total Time spent with patient: 15 minutes  Past Psychiatric History: Yes  Past Medical History:  Past Medical History:  Diagnosis Date   Depression    Diabetes mellitus without complication ([place])    Diabetes mellitus, type II ([place])    Hypertension    Schizophrenia ([place])    History reviewed. No pertinent surgical history. Family History:  Family History  Problem Relation Age of Onset   Diabetes Mother    Glaucoma Mother    Heart disease Mother    Heart attack Mother    Depression Mother    Hypertension Mother    Diabetes Father    Glaucoma Father    Hypertension Father    Diabetes Sister    Depression Sister    Post-traumatic stress disorder Sister     Social History:  Social History   Substance and Sexual Activity  Alcohol Use No   Alcohol/week: 0.0 standard drinks     Social History   Substance and Sexual Activity  Drug Use No    Social History   Socioeconomic History   Marital status: Single    Spouse name: Not on file   Number of children: Not on file   Years of education: Not on file   Highest education level: Not on file  Occupational History   Not on file  Tobacco Use   Smoking status: Every Day    Packs/day: 1.00    Types: Cigarettes    Start date: 04/21/1978   Smokeless  tobacco: Never  Vaping Use   Vaping Use: Never used  Substance and Sexual Activity   Alcohol use: No    Alcohol/week: 0.0 standard drinks   Drug use: No   Sexual activity: Never  Other Topics Concern   Not on file  Social History Narrative   Not on file   Social Determinants of Health   Financial Resource Strain: Not on file  Food Insecurity: Not on file  Transportation Needs: Not on file  Physical Activity: Not on file  Stress: Not on file  Social Connections: Not on file   Additional Social History:                         Sleep: Good  Appetite:  Good  Current Medications: Current Facility-Administered Medications  Medication Dose Route Frequency Provider Last Rate Last Admin   acetaminophen (TYLENOL) tablet 650 mg  650 mg Oral Q6H PRN Waldon Merl F, NP   650 mg at 09/22/21 1330   alum & mag hydroxide-simeth (MAALOX/MYLANTA) 200-200-20 MG/5ML suspension 30 mL  30 mL Oral Q4H PRN Waldon Merl F, NP       atorvastatin (LIPITOR) tablet 20 mg  20 mg Oral QHS Parks Ranger, DO   20 mg at 09/22/21 2131  diltiazem (CARDIZEM CD) 24 hr capsule 120 mg  120 mg Oral Daily Parks Ranger, DO   120 mg at 09/24/21 0914   escitalopram (LEXAPRO) tablet 10 mg  10 mg Oral Daily Parks Ranger, DO   10 mg at 09/24/21 1610   feeding supplement (GLUCERNA SHAKE) (GLUCERNA SHAKE) liquid 237 mL  237 mL Oral TID BM Parks Ranger, DO   237 mL at 09/24/21 0915   gabapentin (NEURONTIN) capsule 300 mg  300 mg Oral TID Parks Ranger, DO   300 mg at 09/24/21 9604   insulin aspart (novoLOG) injection 0-5 Units  0-5 Units Subcutaneous QHS Caroline Sauger, NP   2 Units at 09/23/21 2213   insulin aspart (novoLOG) injection 0-9 Units  0-9 Units Subcutaneous TID WC Caroline Sauger, NP   2 Units at 09/24/21 1152   losartan (COZAAR) tablet 100 mg  100 mg Oral QHS Caroline Sauger, NP   100 mg at 09/22/21 2130   magnesium hydroxide  (MILK OF MAGNESIA) suspension 30 mL  30 mL Oral Daily PRN Sherlon Handing, NP       paliperidone (INVEGA) 24 hr tablet 6 mg  6 mg Oral Daily Parks Ranger, DO   6 mg at 09/24/21 0914   pantoprazole (PROTONIX) EC tablet 40 mg  40 mg Oral Daily Parks Ranger, DO   40 mg at 09/24/21 5409   traZODone (DESYREL) tablet 100 mg  100 mg Oral QHS Parks Ranger, DO   100 mg at 09/22/21 2131    Lab Results:  Results for orders placed or performed during the hospital encounter of 09/21/21 (from the past 48 hour(s))  Glucose, capillary     Status: Abnormal   Collection Time: 09/22/21  4:22 PM  Result Value Ref Range   Glucose-Capillary 174 (H) 70 - 99 mg/dL    Comment: Glucose reference range applies only to samples taken after fasting for at least 8 hours.  Glucose, capillary     Status: Abnormal   Collection Time: 09/22/21  9:20 PM  Result Value Ref Range   Glucose-Capillary 324 (H) 70 - 99 mg/dL    Comment: Glucose reference range applies only to samples taken after fasting for at least 8 hours.   Comment 1 Notify RN   Glucose, capillary     Status: Abnormal   Collection Time: 09/23/21  7:52 AM  Result Value Ref Range   Glucose-Capillary 182 (H) 70 - 99 mg/dL    Comment: Glucose reference range applies only to samples taken after fasting for at least 8 hours.  Glucose, capillary     Status: Abnormal   Collection Time: 09/23/21 11:52 AM  Result Value Ref Range   Glucose-Capillary 202 (H) 70 - 99 mg/dL    Comment: Glucose reference range applies only to samples taken after fasting for at least 8 hours.  Glucose, capillary     Status: Abnormal   Collection Time: 09/23/21  4:26 PM  Result Value Ref Range   Glucose-Capillary 117 (H) 70 - 99 mg/dL    Comment: Glucose reference range applies only to samples taken after fasting for at least 8 hours.  Glucose, capillary     Status: Abnormal   Collection Time: 09/23/21  8:46 PM  Result Value Ref Range    Glucose-Capillary 207 (H) 70 - 99 mg/dL    Comment: Glucose reference range applies only to samples taken after fasting for at least 8 hours.   Comment 1 Notify RN  Glucose, capillary     Status: Abnormal   Collection Time: 09/24/21  7:43 AM  Result Value Ref Range   Glucose-Capillary 164 (H) 70 - 99 mg/dL    Comment: Glucose reference range applies only to samples taken after fasting for at least 8 hours.  Glucose, capillary     Status: Abnormal   Collection Time: 09/24/21 11:49 AM  Result Value Ref Range   Glucose-Capillary 170 (H) 70 - 99 mg/dL    Comment: Glucose reference range applies only to samples taken after fasting for at least 8 hours.    Blood Alcohol level:  Lab Results  Component Value Date   ETH <10 09/20/2021   ETH <10 56/38/9373    Metabolic Disorder Labs: Lab Results  Component Value Date   HGBA1C 7.9 (H) 10/13/2020   MPG 180.03 10/13/2020   MPG 180.03 06/09/2020   No results found for: PROLACTIN Lab Results  Component Value Date   CHOL 267 (H) 10/13/2020   TRIG 180 (H) 10/13/2020   HDL 86 10/13/2020   CHOLHDL 3.1 10/13/2020   VLDL 36 10/13/2020   LDLCALC 145 (H) 10/13/2020   LDLCALC 158 (H) 02/29/2020    Physical Findings: AIMS:  , ,  ,  ,    CIWA:    COWS:     Musculoskeletal: Strength & Muscle Tone: abnormal Gait & Station: normal Patient leans: N/A  Psychiatric Specialty Exam:  Presentation  General Appearance: Bizarre; Disheveled  Eye Contact:Fair  Speech:Garbled  Speech Volume:Decreased  Handedness:Right   Mood and Affect  Mood:No data recorded Affect:Flat   Thought Process  Thought Processes:Disorganized  Descriptions of Associations:Loose  Orientation:Partial  Thought Content:Illogical  History of Schizophrenia/Schizoaffective disorder:Yes  Duration of Psychotic Symptoms:Greater than six months  Hallucinations:No data recorded Ideas of Reference:No data recorded Suicidal Thoughts:No data  recorded Homicidal Thoughts:No data recorded  Sensorium  Memory:Immediate Poor  Judgment:Impaired  Insight:Lacking   Executive Functions  Concentration:Poor  Attention Span:Poor  Recall:Poor  Fund of Knowledge:Poor  Language:Poor   Psychomotor Activity  Psychomotor Activity:No data recorded  Assets  Assets:Housing; Social Support   Sleep  Sleep:No data recorded   Physical Exam: Physical Exam Vitals and nursing note reviewed.  Constitutional:      Appearance: Normal appearance. She is normal weight.  Neurological:     General: No focal deficit present.     Mental Status: She is alert and oriented to person, place, and time.  Psychiatric:        Attention and Perception: Attention normal. She perceives auditory hallucinations.        Mood and Affect: Mood normal. Affect is flat.        Speech: Speech is delayed.        Behavior: Behavior normal. Behavior is cooperative.        Thought Content: Thought content is paranoid.        Cognition and Memory: Cognition is impaired. Memory is impaired.        Judgment: Judgment is inappropriate.   Review of Systems  Constitutional: Negative.   HENT: Negative.    Eyes: Negative.   Respiratory: Negative.    Cardiovascular: Negative.   Gastrointestinal: Negative.   Genitourinary: Negative.   Musculoskeletal: Negative.   Skin: Negative.   Neurological: Negative.   Endo/Heme/Allergies: Negative.   Psychiatric/Behavioral:  Positive for hallucinations and memory loss. The patient is nervous/anxious.   Blood pressure (!) 149/59, pulse 63, temperature 98.2 F (36.8 C), temperature source Oral, resp. rate 18, height 5\' 3"  (  1.6 m), weight 51.7 kg, SpO2 96 %. Body mass index is 20.19 kg/m.   Treatment Plan Summary: Daily contact with patient to assess and evaluate symptoms and progress in treatment, Medication management, and Plan CMP in the morning.  Parks Ranger, DO 09/24/2021, 12:04 PM

## 2021-09-24 NOTE — Progress Notes (Signed)
Recreation Therapy Notes  INPATIENT RECREATION THERAPY ASSESSMENT  Patient Details Name: Alicia Rogers MRN: [id number] DOB: 09/18/1960 Today's Date: 09/24/2021       Information Obtained From: Patient  Able to Participate in Assessment/Interview: Yes  Patient Presentation: Responsive  Reason for Admission (Per Patient): Patient Unable to Identify  Patient Stressors:    Coping Skills:   Music, Talk, TV  Leisure Interests (2+):  Exercise - Walking, Games - Jig-saw puzzles, Music - Listen, Individual - TV, Art - Coloring  Frequency of Recreation/Participation: Weekly  Awareness of Community Resources:  Yes  Community Resources:  [employer], Art therapist  Current Use: Yes  If no, Barriers?:    Expressed Interest in [place] of Residence:  Insurance underwriter  Patient Main Form of Transportation: Uber/Lyft  Patient Strengths:  Help others  Patient Identified Areas of Improvement:  N/A  Patient Goal for Hospitalization:  Be healthy  Current SI (including self-harm):  No  Current HI:  No  Current AVH: Yes  Staff Intervention Plan: Group Attendance, Collaborate with Interdisciplinary Treatment Team  Consent to Intern Participation: N/A  Jaquon Gingerich 09/24/2021, 4:08 PM

## 2021-09-24 NOTE — Progress Notes (Signed)
Patient presents with flat affect. Patient is has delayed responses to some questions and will not answer others. Patient denies SI and HI. She will not answer whether or not she is experiencing AVH and appears confused at the question. Patient stated that she refused her medications last night because they "looked wrong". Patient was brought all medications inside pill packets this morning and all medications were explained to patient. Patient did not appear paranoid and was compliant with morning medications. Patient remains safe on the unit at this time.

## 2021-09-24 NOTE — Plan of Care (Signed)
Emotional support given Problem: Education: Goal: Knowledge of [place] [facility] Education information/materials will improve Outcome: Progressing Goal: Emotional status will improve Outcome: Progressing Goal: Mental status will improve Outcome: Progressing Goal: Verbalization of understanding the information provided will improve Outcome: Progressing

## 2021-09-24 NOTE — [facility] Suicide Risk Assessment (Signed)
[place] INPATIENT:  Family/Significant Other Suicide Prevention Education  Suicide Prevention Education:  Education Completed; Teacher, adult education  (name of family member/significant other) has been identified by the patient as the family member/significant other with whom the patient will be residing, and identified as the person(s) who will aid the patient in the event of a mental health crisis (suicidal ideations/suicide attempt).  With written consent from the patient, the family member/significant other has been provided the following suicide prevention education, prior to the and/or following the discharge of the patient.  The suicide prevention education provided includes the following: Suicide risk factors Suicide prevention and interventions National Suicide Hotline telephone number [facility] assessment telephone number [facility] Emergency Assistance [place] and/or Residential Mobile Crisis Unit telephone number  Request made of family/significant other to: Remove weapons (e.g., guns, rifles, knives), all items previously/currently identified as safety concern.   Remove drugs/medications (over-the-counter, prescriptions, illicit drugs), all items previously/currently identified as a safety concern.  The family member/significant other verbalizes understanding of the suicide prevention education information provided.  The family member/significant other agrees to remove the items of safety concern listed above.  Alicia Rogers 09/24/2021, 4:07 PM

## 2021-09-24 NOTE — Progress Notes (Signed)
Pt. attended group but did not engage in discussion and appeared to be disengaged throughout group.

## 2021-09-24 NOTE — Progress Notes (Signed)
Recreation Therapy Notes    Date: 09/24/2021  Time: 1:30 pm    Location: Courtyard     Behavioral response: N/A   Intervention Topic: Relaxation   Discussion/Intervention: Patient refused to attend group.   Clinical Observations/Feedback:  Patient refused to attend group.    Ryatt Corsino LRT/CTRS        Hanin Decook 09/24/2021 4:04 PM

## 2021-09-24 NOTE — Progress Notes (Signed)
Recreation Therapy Notes  INPATIENT RECREATION TR PLAN  Patient Details Name: Alicia Rogers MRN: [id number] DOB: [birth year] Today's Date: 09/24/2021  Rec Therapy Plan Is patient appropriate for Therapeutic Recreation?: Yes Treatment times per week: at least 3 Estimated Length of Stay: 5-7 days TR Treatment/Interventions: Group participation (Comment)  Discharge Criteria Pt will be discharged from therapy if:: Discharged Treatment plan/goals/alternatives discussed and agreed upon by:: Patient/family  Discharge Summary     Kendra Woolford 09/24/2021, 4:09 PM

## 2021-09-24 NOTE — Progress Notes (Signed)
Inpatient Diabetes Program Recommendations  AACE/ADA: New Consensus Statement on Inpatient Glycemic Control (2015)  Target Ranges:  Prepandial:   less than 140 mg/dL      Peak postprandial:   less than 180 mg/dL (1-2 hours)      Critically ill patients:  140 - 180 mg/dL    Latest Reference Range & Units 09/23/21 07:52 09/23/21 11:52 09/23/21 16:26 09/23/21 20:46  Glucose-Capillary 70 - 99 mg/dL 182 (H) 202 (H) 117 (H) 207 (H)  (H): Data is abnormally high  Latest Reference Range & Units 09/24/21 07:43  Glucose-Capillary 70 - 99 mg/dL 164 (H)  (H): Data is abnormally high  Diabetes history: Type 2 DM  Outpatient Diabetes meds: Januvia 100 mg daily            Metformin 500 mg BID  Current orders: Novolog 0-9 units TID & HS      MD- Please consider restarting home doses of Metformin 500 mg BID + Januvia 100 mg Daily     --Will follow patient during hospitalization--  Wyn Quaker RN, MSN, CDE Diabetes Coordinator Inpatient Glycemic Control Team Team Pager: [phone] (8a-5p)

## 2021-09-25 LAB — COMPREHENSIVE METABOLIC PANEL
ALT: 16 U/L (ref 0–44)
AST: 18 U/L (ref 15–41)
Albumin: 2.3 g/dL — ABNORMAL LOW (ref 3.5–5.0)
Alkaline Phosphatase: 80 U/L (ref 38–126)
Anion gap: 7 (ref 5–15)
BUN: 47 mg/dL — ABNORMAL HIGH (ref 8–23)
CO2: 25 mmol/L (ref 22–32)
Calcium: 8.5 mg/dL — ABNORMAL LOW (ref 8.9–10.3)
Chloride: 109 mmol/L (ref 98–111)
Creatinine, Ser: 2.27 mg/dL — ABNORMAL HIGH (ref 0.44–1.00)
GFR, Estimated: 24 mL/min — ABNORMAL LOW (ref >60)
Glucose, Bld: 184 mg/dL — ABNORMAL HIGH (ref 70–99)
Potassium: 4.4 mmol/L (ref 3.5–5.1)
Sodium: 141 mmol/L (ref 135–145)
Total Bilirubin: 0.2 mg/dL — ABNORMAL LOW (ref 0.3–1.2)
Total Protein: 6.2 g/dL — ABNORMAL LOW (ref 6.5–8.1)

## 2021-09-25 LAB — GLUCOSE, CAPILLARY
Glucose-Capillary: 196 mg/dL — ABNORMAL HIGH (ref 70–99)
Glucose-Capillary: 185 mg/dL — ABNORMAL HIGH (ref 70–99)
Glucose-Capillary: 164 mg/dL — ABNORMAL HIGH (ref 70–99)
Glucose-Capillary: 138 mg/dL — ABNORMAL HIGH (ref 70–99)

## 2021-09-25 MED ORDER — DILTIAZEM HCL ER COATED BEADS 240 MG PO CP24
240.0000 mg | ORAL_CAPSULE | Freq: Every day | ORAL | Status: DC
  Administered 2021-09-26: 240 mg via ORAL
  Filled 2021-09-25 (×2): qty 1

## 2021-09-25 NOTE — Plan of Care (Signed)
Pt seen interacting with a peer. Pt also came by the nurses station to see if the break room was closed for the night. Pt asking questions and responding more to staff.  Problem: Education: Goal: Emotional status will improve Outcome: Progressing

## 2021-09-25 NOTE — Progress Notes (Signed)
Pt in her room at the beginning of the shift, but later came to the dayroom for snacks and refreshments. Pt states that she is "alright". Pt seen interacting with a peer. Writer offered  this patient her medications, which she took, after looking at the pills intently for a short period. No complaints noted.  Pt denies SI/HI/AVH. Q15 mins safety checks maintained.

## 2021-09-25 NOTE — Progress Notes (Signed)
Recreation Therapy Notes   Date: 09/25/2021   Time: 1:40 pm   Location: Craft room    Behavioral response: N/A   Intervention Topic: Wellness    Discussion/Intervention: Patient refused to attend group.    Clinical Observations/Feedback:  Patient refused to attend group.    Terance Pomplun LRT/CTRS        Ellar Hakala 09/25/2021 2:51 PM

## 2021-09-25 NOTE — [facility] Group Notes (Signed)
[facility] Group Notes:  (Nursing/MHT/Case Management/Adjunct)  Date:  09/25/2021  Time:  6:31 PM  Type of Therapy:  Group Therapy  Participation Level:  Did Not Attend  Participation Quality:   N/A  Affect:   N/A  Cognitive:   N/A  Insight:  None  Engagement in Group:   N/A  Modes of Intervention:   N/A  Summary of Progress/Problems: Pt. Did not attend group. Pt came into day room to eat breakfast during group and then returned back to room. Juliette Alcide 09/25/2021, 6:31 PM

## 2021-09-25 NOTE — Progress Notes (Signed)
Patient is very sleepy this morning. She will open her eyes and mumble some words and go back to sleep. Patient was woken up at 10am and required staff assist to go to the bathroom. Patient verbalized needing to go to the bathroom, but did not make it to the toilet and was incontinent. Patient was cleaned and changed. Patient does not answer assessment questions. Patient also would not hold medication cup and take medications on her own. Medications needed to be put in patient's mouth and patient was instructed to drink water and swallow.   Morning insulin was held until 11am, when she got up to eat her breakfast tray. Patient ate her meal and drank a cup of tea and her Glucerna shake. Patient was also encouraged to drink more water.

## 2021-09-25 NOTE — Progress Notes (Signed)
[facility] MD Progress Note  09/25/2021 11:41 AM Alicia Rogers  MRN:  [id number] Subjective:  Alicia Rogers continues to be internally preoccupied.  She is very withdrawn to her room.  She is not eating and drinking very much so I encouraged the nurses to give her some Gatorade.  Her lab work came back with her glucose improved but her BUN and creatinine continued to rise.  Her blood pressure is still high.  She is also showing decreased protein and GFR.  She is taking her medications as prescribed denies any side effects.  There is no evidence of EPS or TD.  Social work spoke with the family and her note is below.   CSW spoke with [name], [phone]'s sister. Her sister stated that she was at 2 different group homes/family care homes before moving to her brother's house. Alicia Rogers had to leave care homes due to elopement and not taking her medications. [name] states brother and sister have been taking care of Alicia Rogers and Alicia Rogers has the wrap around services she needs but has decompensated due to noncompliance with diabetes and psych medication and not telling family about her her appointments or care needs.    She stated that Alicia Rogers has called the police randomly at night, would get rides with cab companies to random places and spend money that she does not have. Alicia Rogers states she believes Alicia Rogers needs higher level of care.    Alicia Rogers has case worker from [phone]. CSW will follow up to discuss discharge planning and get collateral information.    She stated Alicia Rogers also has a primary care provider in [place], but cannot recall the provider's name. She stated that she had a nurse, Patrina Levering, nurse, [phone] coming to Alicia Rogers's home and giving medication injections but Alicia Rogers would not comply.  She stated she wanted Alicia Rogers to improve medication consistency and verbalizing needs.  Principal Problem: Schizoaffective disorder, depressive type ([place]) Diagnosis: Principal Problem:   Schizoaffective disorder, depressive type  ([place])  Total Time spent with Alicia Rogers: 15 minutes  Past Psychiatric History: yes  Past Medical History:  Past Medical History:  Diagnosis Date   Depression    Diabetes mellitus without complication ([place])    Diabetes mellitus, type II ([place])    Hypertension    Schizophrenia ([place])    History reviewed. No pertinent surgical history. Family History:  Family History  Problem Relation Age of Onset   Diabetes Mother    Glaucoma Mother    Heart disease Mother    Heart attack Mother    Depression Mother    Hypertension Mother    Diabetes Father    Glaucoma Father    Hypertension Father    Diabetes Sister    Depression Sister    Post-traumatic stress disorder Sister     Social History:  Social History   Substance and Sexual Activity  Alcohol Use No   Alcohol/week: 0.0 standard drinks     Social History   Substance and Sexual Activity  Drug Use No    Social History   Socioeconomic History   Marital status: Single    Spouse name: Not on file   Number of children: Not on file   Years of education: Not on file   Highest education level: Not on file  Occupational History   Not on file  Tobacco Use   Smoking status: Every Day    Packs/day: 1.00    Types: Cigarettes    Start date: 04/21/1978   Smokeless tobacco:  Never  Vaping Use   Vaping Use: Never used  Substance and Sexual Activity   Alcohol use: No    Alcohol/week: 0.0 standard drinks   Drug use: No   Sexual activity: Never  Other Topics Concern   Not on file  Social History Narrative   Not on file   Social Determinants of Health   Financial Resource Strain: Not on file  Food Insecurity: Not on file  Transportation Needs: Not on file  Physical Activity: Not on file  Stress: Not on file  Social Connections: Not on file   Additional Social History:                         Sleep: Poor  Appetite:  Poor  Current Medications: Current Facility-Administered  Medications  Medication Dose Route Frequency Provider Last Rate Last Admin   acetaminophen (TYLENOL) tablet 650 mg  650 mg Oral Q6H PRN Sherlon Handing, NP   650 mg at 09/22/21 1330   alum & mag hydroxide-simeth (MAALOX/MYLANTA) 200-200-20 MG/5ML suspension 30 mL  30 mL Oral Q4H PRN Waldon Merl F, NP       atorvastatin (LIPITOR) tablet 20 mg  20 mg Oral QHS Parks Ranger, DO   20 mg at 09/24/21 2113   diltiazem (CARDIZEM CD) 24 hr capsule 120 mg  120 mg Oral Daily Parks Ranger, DO   120 mg at 09/25/21 1010   escitalopram (LEXAPRO) tablet 10 mg  10 mg Oral Daily Parks Ranger, DO   10 mg at 09/25/21 1010   feeding supplement (GLUCERNA SHAKE) (GLUCERNA SHAKE) liquid 237 mL  237 mL Oral TID BM Parks Ranger, DO   237 mL at 09/25/21 1026   gabapentin (NEURONTIN) capsule 300 mg  300 mg Oral TID Parks Ranger, DO   300 mg at 09/25/21 1010   insulin aspart (novoLOG) injection 0-5 Units  0-5 Units Subcutaneous QHS Caroline Sauger, NP   2 Units at 09/23/21 2213   insulin aspart (novoLOG) injection 0-9 Units  0-9 Units Subcutaneous TID WC Caroline Sauger, NP   2 Units at 09/25/21 1109   losartan (COZAAR) tablet 100 mg  100 mg Oral QHS Caroline Sauger, NP   100 mg at 09/24/21 2113   magnesium hydroxide (MILK OF MAGNESIA) suspension 30 mL  30 mL Oral Daily PRN Sherlon Handing, NP       paliperidone (INVEGA) 24 hr tablet 6 mg  6 mg Oral Daily Parks Ranger, DO   6 mg at 09/25/21 1010   pantoprazole (PROTONIX) EC tablet 40 mg  40 mg Oral Daily Parks Ranger, DO   40 mg at 09/25/21 1010   traZODone (DESYREL) tablet 100 mg  100 mg Oral QHS Parks Ranger, DO   100 mg at 09/24/21 2113   zinc oxide 20 % ointment   Topical [id number] Parks Ranger, DO   1 application at 79/15/05 6979    Lab Results:  Results for orders placed or performed during the hospital encounter of 09/21/21 (from  the past 48 hour(s))  Glucose, capillary     Status: Abnormal   Collection Time: 09/23/21 11:52 AM  Result Value Ref Range   Glucose-Capillary 202 (H) 70 - 99 mg/dL    Comment: Glucose reference range applies only to samples taken after fasting for at least 8 hours.  Glucose, capillary     Status: Abnormal   Collection Time: 09/23/21  4:26 PM  Result Value Ref Range   Glucose-Capillary 117 (H) 70 - 99 mg/dL    Comment: Glucose reference range applies only to samples taken after fasting for at least 8 hours.  Glucose, capillary     Status: Abnormal   Collection Time: 09/23/21  8:46 PM  Result Value Ref Range   Glucose-Capillary 207 (H) 70 - 99 mg/dL    Comment: Glucose reference range applies only to samples taken after fasting for at least 8 hours.   Comment 1 Notify RN   Glucose, capillary     Status: Abnormal   Collection Time: 09/24/21  7:43 AM  Result Value Ref Range   Glucose-Capillary 164 (H) 70 - 99 mg/dL    Comment: Glucose reference range applies only to samples taken after fasting for at least 8 hours.  Glucose, capillary     Status: Abnormal   Collection Time: 09/24/21 11:49 AM  Result Value Ref Range   Glucose-Capillary 170 (H) 70 - 99 mg/dL    Comment: Glucose reference range applies only to samples taken after fasting for at least 8 hours.  Glucose, capillary     Status: Abnormal   Collection Time: 09/24/21  4:23 PM  Result Value Ref Range   Glucose-Capillary 208 (H) 70 - 99 mg/dL    Comment: Glucose reference range applies only to samples taken after fasting for at least 8 hours.  Glucose, capillary     Status: Abnormal   Collection Time: 09/24/21  7:55 PM  Result Value Ref Range   Glucose-Capillary 141 (H) 70 - 99 mg/dL    Comment: Glucose reference range applies only to samples taken after fasting for at least 8 hours.   Comment 1 Notify RN   Comprehensive metabolic panel     Status: Abnormal   Collection Time: 09/25/21  6:29 AM  Result Value Ref Range    Sodium 141 135 - 145 mmol/L   Potassium 4.4 3.5 - 5.1 mmol/L   Chloride 109 98 - 111 mmol/L   CO2 25 22 - 32 mmol/L   Glucose, Bld 184 (H) 70 - 99 mg/dL    Comment: Glucose reference range applies only to samples taken after fasting for at least 8 hours.   BUN 47 (H) 8 - 23 mg/dL   Creatinine, Ser 2.27 (H) 0.44 - 1.00 mg/dL   Calcium 8.5 (L) 8.9 - 10.3 mg/dL   Total Protein 6.2 (L) 6.5 - 8.1 g/dL   Albumin 2.3 (L) 3.5 - 5.0 g/dL   AST 18 15 - 41 U/L   ALT 16 0 - 44 U/L   Alkaline Phosphatase 80 38 - 126 U/L   Total Bilirubin 0.2 (L) 0.3 - 1.2 mg/dL   GFR, Estimated 24 (L) >60 mL/min    Comment: (NOTE) Calculated using the CKD-EPI Creatinine Equation (2021)    Anion gap 7 5 - 15    Comment: Performed at [facility], [address] [place], [place] [address]  Glucose, capillary     Status: Abnormal   Collection Time: 09/25/21  7:50 AM  Result Value Ref Range   Glucose-Capillary 196 (H) 70 - 99 mg/dL    Comment: Glucose reference range applies only to samples taken after fasting for at least 8 hours.  Glucose, capillary     Status: Abnormal   Collection Time: 09/25/21 10:33 AM  Result Value Ref Range   Glucose-Capillary 185 (H) 70 - 99 mg/dL    Comment: Glucose reference range applies only to samples taken after  fasting for at least 8 hours.    Blood Alcohol level:  Lab Results  Component Value Date   ETH <10 09/20/2021   ETH <10 21/19/4174    Metabolic Disorder Labs: Lab Results  Component Value Date   HGBA1C 7.9 (H) 10/13/2020   MPG 180.03 10/13/2020   MPG 180.03 06/09/2020   No results found for: PROLACTIN Lab Results  Component Value Date   CHOL 267 (H) 10/13/2020   TRIG 180 (H) 10/13/2020   HDL 86 10/13/2020   CHOLHDL 3.1 10/13/2020   VLDL 36 10/13/2020   LDLCALC 145 (H) 10/13/2020   LDLCALC 158 (H) 02/29/2020    Physical Findings: AIMS:  , ,  ,  ,    CIWA:    COWS:     Musculoskeletal: Strength & Muscle Tone: within normal limits Gait &  Station: unsteady Alicia Rogers leans: N/A  Psychiatric Specialty Exam:  Presentation  General Appearance: Bizarre; Disheveled  Eye Contact:Fair  Speech:Garbled  Speech Volume:Decreased  Handedness:Right   Mood and Affect  Mood:No data recorded Affect:Flat   Thought Process  Thought Processes:Disorganized  Descriptions of Associations:Loose  Orientation:Partial  Thought Content:Illogical  History of Schizophrenia/Schizoaffective disorder:Yes  Duration of Psychotic Symptoms:Greater than six months  Hallucinations:No data recorded Ideas of Reference:No data recorded Suicidal Thoughts:No data recorded Homicidal Thoughts:No data recorded  Sensorium  Memory:Immediate Poor  Judgment:Impaired  Insight:Lacking   Executive Functions  Concentration:Poor  Attention Span:Poor  Recall:Poor  Fund of Knowledge:Poor  Language:Poor   Psychomotor Activity  Psychomotor Activity:No data recorded  Assets  Assets:Housing; Social Support   Sleep  Sleep:No data recorded   Physical Exam: Physical Exam Vitals and nursing note reviewed.  Constitutional:      Appearance: Normal appearance. She is normal weight.  Neurological:     General: No focal deficit present.     Mental Status: She is alert and oriented to person, place, and time.  Psychiatric:        Attention and Perception: Attention normal. She perceives auditory hallucinations.        Mood and Affect: Mood is depressed. Affect is flat.        Speech: Speech is delayed.        Behavior: Behavior is withdrawn. Behavior is cooperative.        Thought Content: Thought content is paranoid.        Cognition and Memory: Cognition is impaired. Memory is impaired.        Judgment: Judgment is impulsive and inappropriate.   Review of Systems  Constitutional: Negative.   HENT: Negative.    Eyes: Negative.   Respiratory: Negative.    Cardiovascular: Negative.   Gastrointestinal: Negative.   Genitourinary:  Negative.   Musculoskeletal: Negative.   Skin: Negative.   Neurological: Negative.   Endo/Heme/Allergies: Negative.   Psychiatric/Behavioral:  Positive for hallucinations.   Blood pressure (!) 178/58, pulse 76, temperature 98.1 F (36.7 C), resp. rate 18, height 5\' 3"  (1.6 m), weight 51.7 kg, SpO2 98 %. Body mass index is 20.19 kg/m.   Treatment Plan Summary: Daily contact with Alicia Rogers to assess and evaluate symptoms and progress in treatment, Medication management, and Plan encourage oral intake and continue current medications. Increase Cardizem CD to 240.  Parks Ranger, DO 09/25/2021, 11:41 AM

## 2021-09-26 LAB — GLUCOSE, CAPILLARY
Glucose-Capillary: 207 mg/dL — ABNORMAL HIGH (ref 70–99)
Glucose-Capillary: 321 mg/dL — ABNORMAL HIGH (ref 70–99)
Glucose-Capillary: 127 mg/dL — ABNORMAL HIGH (ref 70–99)
Glucose-Capillary: 231 mg/dL — ABNORMAL HIGH (ref 70–99)
Glucose-Capillary: 306 mg/dL — ABNORMAL HIGH (ref 70–99)

## 2021-09-26 MED ORDER — LORAZEPAM 1 MG PO TABS
1.0000 mg | ORAL_TABLET | Freq: Three times a day (TID) | ORAL | Status: DC
  Administered 2021-09-26 (×3): 1 mg via ORAL
  Filled 2021-09-26 (×3): qty 1

## 2021-09-26 NOTE — Progress Notes (Signed)
[facility] MD Progress Note  09/26/2021 11:28 AM Alicia Rogers  MRN:  [id number]   Principal Problem: Schizoaffective disorder, depressive type ([place]) Diagnosis: Principal Problem:   Schizoaffective disorder, depressive type ([place])  Patient is a  y.o. 61yo female who presents to the [facility] unit due to non-compliance with psych medications and not taking care of self.   Interval History Patient was seen today for re-evaluation.  Nursing reports the patient appears more lethargic. They encourage and monitor patient's oral intake. Per RN, patient ate minimally, although she was "good with drinking fluids" this morning.   Subjective:  On assessment patient appears very withdrawn and is not participating in the interview. She is awake, but is very flat, unemotional, no eye contact. She is able to follow simple commands.Her movements are very slow. I asked the patient to show me her arms for assessment; patient was holding her arm in odd position for a while after I finished my assessment. 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 We will continue to encourage patient eat and drink.   Her lab work from yesterday was remarkable for BUN and creatinine continued to rise (Dr. Louis Meckel had already addressed it in his note).   Her blood pressure is still high today. She is taking her medications as prescribed, does not express any side effects. Social work spoke with the family yesterday re future plans (note is in the chart).  Labs: no new results for review today.     Total Time spent with patient: 30 minutes  Past Psychiatric History: see H&P  Past Medical History:  Past Medical History:  Diagnosis Date   Depression    Diabetes mellitus without complication ([place])    Diabetes mellitus, type II ([place])    Hypertension    Schizophrenia ([place])    History reviewed. No pertinent surgical history. Family History:  Family History   Problem Relation Age of Onset   Diabetes Mother    Glaucoma Mother    Heart disease Mother    Heart attack Mother    Depression Mother    Hypertension Mother    Diabetes Father    Glaucoma Father    Hypertension Father    Diabetes Sister    Depression Sister    Post-traumatic stress disorder Sister    Family Psychiatric  History: see H&P Social History:  Social History   Substance and Sexual Activity  Alcohol Use No   Alcohol/week: 0.0 standard drinks     Social History   Substance and Sexual Activity  Drug Use No    Social History   Socioeconomic History   Marital status: Single    Spouse name: Not on file   Number of children: Not on file   Years of education: Not on file   Highest education level: Not on file  Occupational History   Not on file  Tobacco Use   Smoking status: Every Day    Packs/day: 1.00    Types: Cigarettes    Start date: 04/21/1978   Smokeless tobacco: Never  Vaping Use   Vaping Use: Never used  Substance and Sexual Activity   Alcohol use: No    Alcohol/week: 0.0 standard drinks   Drug use: No   Sexual activity: Never  Other Topics Concern   Not on file  Social History Narrative   Not on file   Social Determinants of Health   Financial Resource Strain: Not on file  Food Insecurity: Not on file  Transportation Needs: Not on file  Physical Activity: Not on file  Stress: Not on file  Social Connections: Not on file   Additional Social History:                         Sleep: Poor  Appetite:  Poor  Current Medications: Current Facility-Administered Medications  Medication Dose Route Frequency Provider Last Rate Last Admin   acetaminophen (TYLENOL) tablet 650 mg  650 mg Oral Q6H PRN Waldon Merl F, NP   650 mg at 09/22/21 1330   alum & mag hydroxide-simeth (MAALOX/MYLANTA) 200-200-20 MG/5ML suspension 30 mL  30 mL Oral Q4H PRN Waldon Merl F, NP       atorvastatin (LIPITOR) tablet 20 mg  20 mg Oral QHS  Parks Ranger, DO   20 mg at 09/25/21 2115   diltiazem (CARDIZEM CD) 24 hr capsule 240 mg  240 mg Oral Daily Parks Ranger, DO   240 mg at 09/26/21 0902   escitalopram (LEXAPRO) tablet 10 mg  10 mg Oral Daily Parks Ranger, DO   10 mg at 09/26/21 0902   feeding supplement (GLUCERNA SHAKE) (GLUCERNA SHAKE) liquid 237 mL  237 mL Oral TID BM Parks Ranger, DO   237 mL at 09/26/21 3810   gabapentin (NEURONTIN) capsule 300 mg  300 mg Oral TID Parks Ranger, DO   300 mg at 09/26/21 0902   insulin aspart (novoLOG) injection 0-5 Units  0-5 Units Subcutaneous QHS Caroline Sauger, NP   2 Units at 09/23/21 2213   insulin aspart (novoLOG) injection 0-9 Units  0-9 Units Subcutaneous TID WC Caroline Sauger, NP   3 Units at 09/26/21 0831   LORazepam (ATIVAN) tablet 1 mg  1 mg Oral TID Larita Fife, MD       losartan (COZAAR) tablet 100 mg  100 mg Oral QHS Caroline Sauger, NP   100 mg at 09/25/21 2116   magnesium hydroxide (MILK OF MAGNESIA) suspension 30 mL  30 mL Oral Daily PRN Waldon Merl F, NP       paliperidone (INVEGA) 24 hr tablet 6 mg  6 mg Oral Daily Parks Ranger, DO   6 mg at 09/26/21 0902   pantoprazole (PROTONIX) EC tablet 40 mg  40 mg Oral Daily Parks Ranger, DO   40 mg at 09/26/21 0902   traZODone (DESYREL) tablet 100 mg  100 mg Oral QHS Parks Ranger, DO   100 mg at 09/25/21 2116   zinc oxide 20 % ointment   Topical [id number] Parks Ranger, DO   Given at 09/26/21 1751    Lab Results:  Results for orders placed or performed during the hospital encounter of 09/21/21 (from the past 48 hour(s))  Glucose, capillary     Status: Abnormal   Collection Time: 09/24/21 11:49 AM  Result Value Ref Range   Glucose-Capillary 170 (H) 70 - 99 mg/dL    Comment: Glucose reference range applies only to samples taken after fasting for at least 8 hours.  Glucose, capillary     Status: Abnormal   Collection  Time: 09/24/21  4:23 PM  Result Value Ref Range   Glucose-Capillary 208 (H) 70 - 99 mg/dL    Comment: Glucose reference range applies only to samples taken after fasting for at least 8 hours.  Glucose, capillary     Status: Abnormal   Collection Time: 09/24/21  7:55 PM  Result  Value Ref Range   Glucose-Capillary 141 (H) 70 - 99 mg/dL    Comment: Glucose reference range applies only to samples taken after fasting for at least 8 hours.   Comment 1 Notify RN   Comprehensive metabolic panel     Status: Abnormal   Collection Time: 09/25/21  6:29 AM  Result Value Ref Range   Sodium 141 135 - 145 mmol/L   Potassium 4.4 3.5 - 5.1 mmol/L   Chloride 109 98 - 111 mmol/L   CO2 25 22 - 32 mmol/L   Glucose, Bld 184 (H) 70 - 99 mg/dL    Comment: Glucose reference range applies only to samples taken after fasting for at least 8 hours.   BUN 47 (H) 8 - 23 mg/dL   Creatinine, Ser 2.27 (H) 0.44 - 1.00 mg/dL   Calcium 8.5 (L) 8.9 - 10.3 mg/dL   Total Protein 6.2 (L) 6.5 - 8.1 g/dL   Albumin 2.3 (L) 3.5 - 5.0 g/dL   AST 18 15 - 41 U/L   ALT 16 0 - 44 U/L   Alkaline Phosphatase 80 38 - 126 U/L   Total Bilirubin 0.2 (L) 0.3 - 1.2 mg/dL   GFR, Estimated 24 (L) >60 mL/min    Comment: (NOTE) Calculated using the CKD-EPI Creatinine Equation (2021)    Anion gap 7 5 - 15    Comment: Performed at [facility], [address] [place], [place] [address]  Glucose, capillary     Status: Abnormal   Collection Time: 09/25/21  7:50 AM  Result Value Ref Range   Glucose-Capillary 196 (H) 70 - 99 mg/dL    Comment: Glucose reference range applies only to samples taken after fasting for at least 8 hours.  Glucose, capillary     Status: Abnormal   Collection Time: 09/25/21 10:33 AM  Result Value Ref Range   Glucose-Capillary 185 (H) 70 - 99 mg/dL    Comment: Glucose reference range applies only to samples taken after fasting for at least 8 hours.  Glucose, capillary     Status: Abnormal   Collection  Time: 09/25/21  4:26 PM  Result Value Ref Range   Glucose-Capillary 164 (H) 70 - 99 mg/dL    Comment: Glucose reference range applies only to samples taken after fasting for at least 8 hours.  Glucose, capillary     Status: Abnormal   Collection Time: 09/25/21  8:39 PM  Result Value Ref Range   Glucose-Capillary 138 (H) 70 - 99 mg/dL    Comment: Glucose reference range applies only to samples taken after fasting for at least 8 hours.  Glucose, capillary     Status: Abnormal   Collection Time: 09/26/21  8:05 AM  Result Value Ref Range   Glucose-Capillary 207 (H) 70 - 99 mg/dL    Comment: Glucose reference range applies only to samples taken after fasting for at least 8 hours.    Blood Alcohol level:  Lab Results  Component Value Date   ETH <10 09/20/2021   ETH <10 84/16/6063    Metabolic Disorder Labs: Lab Results  Component Value Date   HGBA1C 7.9 (H) 10/13/2020   MPG 180.03 10/13/2020   MPG 180.03 06/09/2020   No results found for: PROLACTIN Lab Results  Component Value Date   CHOL 267 (H) 10/13/2020   TRIG 180 (H) 10/13/2020   HDL 86 10/13/2020   CHOLHDL 3.1 10/13/2020   VLDL 36 10/13/2020   LDLCALC 145 (H) 10/13/2020   LDLCALC 158 (  H) 02/29/2020    Physical Findings: AIMS:  , ,  ,  ,    CIWA:    COWS:     Musculoskeletal: Strength & Muscle Tone: within normal limits Gait & Station:  slow Patient leans: N/A  Psychiatric Specialty Exam: Appearance:  AAF, appearing stated age, appears under-nourished. Decreased level of alertness and flat facial expression.  Attitude/Behavior: awake, not engaging, poor eye contact.  Motor: dyskinesias not evident. Gait appears slow.  Speech: minimally-speaking to mute  Mood: appears depressed  Affect: flat.  Thought process: unable to assess fully due to minimal patient`s participation in the conversation, she is coherent, unsure if organized.  Thought content: patient does not express suicidal thought, homicidal  thoughts; did not express any delusions.  Thought perception: patient did not report auditory and visual hallucinations. Did not appear internally stimulated.  Cognition: patient appears confused with poor attention and concentration.  Insight: poor  Judgement: impaired   Assets  Assets:Housing; Social Support   Sleep  Sleep:No data recorded   Physical Exam: Physical Exam ROS Blood pressure (!) 168/55, pulse 72, temperature 98.7 F (37.1 C), temperature source Oral, resp. rate 18, height 5\' 3"  (1.6 m), weight 51.7 kg, SpO2 100 %. Body mass index is 20.19 kg/m.   Treatment Plan Summary: Daily contact with patient to assess and evaluate symptoms and progress in treatment and Medication management Chart reviewed. Patient discussed with nursing; per them she appears more withdrawn and confused. 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 She is afebrile. We will continue to encourage patient eat and drink.      Plan:  -continue inpatient psych admission; 15-minute checks; daily contact with patient to assess and evaluate symptoms and progress in treatment; psychoeducation.  -continue scheduled medications:  atorvastatin  20 mg Oral QHS   diltiazem  240 mg Oral Daily   escitalopram  10 mg Oral Daily   feeding supplement (GLUCERNA SHAKE)  237 mL Oral TID BM   gabapentin  300 mg Oral TID   insulin aspart  0-5 Units Subcutaneous QHS   insulin aspart  0-9 Units Subcutaneous TID WC   LORazepam  1 mg Oral TID   losartan  100 mg Oral QHS   paliperidone  6 mg Oral Daily   pantoprazole  40 mg Oral Daily   traZODone  100 mg Oral QHS   zinc oxide   Topical BH-q8a4p    -continue PRN medications. acetaminophen, alum & mag hydroxide-simeth, magnesium hydroxide  -Pertinent Labs: no new labs ordered today  -  I certify that the patient does  need, on a daily basis, active treatment furnished directly by or requiring the supervision of inpatient psychiatric facility personnel.    Larita Fife, MD 09/26/2021, 11:28 AM

## 2021-09-26 NOTE — Progress Notes (Signed)
Patient was compliant with medication. It took her a while to take medication but she finished her night meds.  She denies SI, HI, and AVH. Denies pain.  She was pleasant and cooperative throughout the night.  She ate snack and watched TV with other residents in the milieu area. Safety check of Q 15 was conducted on the unit to ensure patient safety. Support and encouragement was provided.

## 2021-09-26 NOTE — Group Note (Signed)

## 2021-09-26 NOTE — Plan of Care (Signed)
Pt easily aroused by name calling this morning. She presents with a constricted/restricted affect, able to let her needs known but forwards little information. Pt is noted with L hand +2 pitting edema and BLE +2 pitting edema. She reported that the swelling has been ongoing for "a while". She was noted with stiffness during ambulation and inability to fully open L hand palm/hand. She admitted to feeling stiff almost walking as if catatonic and at times she stands in one place and stares blankly as if unable to move. Findings reported off to the provider. She was able to get off the bed and urinate in the commode without any assistance. She was assisted with cleaning her face and hands and she walked to the dayroom to eat her breakfast. She ate about 50% and drank all her ensure and gatorade.  She was med compliant and sat in the dayroom with her peers with no interaction. She ambulates with a slow steady gait with no falls or unsafe behavior noted thus far. Q15 min observations maintained for safety and support provided as needed.  Problem: Education: Goal: Emotional status will improve Outcome: Progressing Goal: Mental status will improve Outcome: Progressing   Problem: Activity: Goal: Interest or engagement in activities will improve Outcome: Progressing   Problem: Health Behavior/Discharge Planning: Goal: Compliance with treatment plan for underlying cause of condition will improve Outcome: Progressing   Problem: Safety: Goal: Periods of time without injury will increase Outcome: Progressing   Problem: Coping: Goal: Will verbalize feelings Outcome: Not Progressing

## 2021-09-27 ENCOUNTER — Inpatient Hospital Stay: Payer: Medicare Other

## 2021-09-27 ENCOUNTER — Observation Stay: Payer: Medicare Other

## 2021-09-27 ENCOUNTER — Observation Stay
Admission: EM | Admit: 2021-09-27 | Discharge: 2021-09-29 | Disposition: A | Discharge status: Other Acute Inpatient Hospital | Payer: Medicare Other | Source: Intra-hospital | Attending: Family Medicine | Admitting: Family Medicine

## 2021-09-27 DIAGNOSIS — F251 Schizoaffective disorder, depressive type: Secondary | ICD-10-CM | POA: Diagnosis present

## 2021-09-27 DIAGNOSIS — R001 Bradycardia, unspecified: Secondary | ICD-10-CM

## 2021-09-27 DIAGNOSIS — N1831 Chronic kidney disease, stage 3a: Secondary | ICD-10-CM | POA: Insufficient documentation

## 2021-09-27 DIAGNOSIS — D631 Anemia in chronic kidney disease: Secondary | ICD-10-CM

## 2021-09-27 DIAGNOSIS — M7989 Other specified soft tissue disorders: Secondary | ICD-10-CM

## 2021-09-27 DIAGNOSIS — R0902 Hypoxemia: Secondary | ICD-10-CM

## 2021-09-27 DIAGNOSIS — N184 Chronic kidney disease, stage 4 (severe): Secondary | ICD-10-CM | POA: Diagnosis not present

## 2021-09-27 DIAGNOSIS — J189 Pneumonia, unspecified organism: Secondary | ICD-10-CM

## 2021-09-27 DIAGNOSIS — Z7984 Long term (current) use of oral hypoglycemic drugs: Secondary | ICD-10-CM | POA: Insufficient documentation

## 2021-09-27 DIAGNOSIS — E1122 Type 2 diabetes mellitus with diabetic chronic kidney disease: Secondary | ICD-10-CM

## 2021-09-27 DIAGNOSIS — Z79899 Other long term (current) drug therapy: Secondary | ICD-10-CM | POA: Insufficient documentation

## 2021-09-27 DIAGNOSIS — F061 Catatonic disorder due to known physiological condition: Secondary | ICD-10-CM | POA: Diagnosis not present

## 2021-09-27 DIAGNOSIS — I129 Hypertensive chronic kidney disease with stage 1 through stage 4 chronic kidney disease, or unspecified chronic kidney disease: Secondary | ICD-10-CM | POA: Insufficient documentation

## 2021-09-27 DIAGNOSIS — R4189 Other symptoms and signs involving cognitive functions and awareness: Secondary | ICD-10-CM

## 2021-09-27 DIAGNOSIS — F17210 Nicotine dependence, cigarettes, uncomplicated: Secondary | ICD-10-CM | POA: Insufficient documentation

## 2021-09-27 DIAGNOSIS — G9341 Metabolic encephalopathy: Secondary | ICD-10-CM | POA: Diagnosis not present

## 2021-09-27 DIAGNOSIS — I10 Essential (primary) hypertension: Secondary | ICD-10-CM | POA: Diagnosis present

## 2021-09-27 DIAGNOSIS — E119 Type 2 diabetes mellitus without complications: Secondary | ICD-10-CM

## 2021-09-27 DIAGNOSIS — I160 Hypertensive urgency: Secondary | ICD-10-CM

## 2021-09-27 HISTORY — DX: Other specified soft tissue disorders: M79.89

## 2021-09-27 LAB — COMPREHENSIVE METABOLIC PANEL
ALT: 17 U/L (ref 0–44)
AST: 18 U/L (ref 15–41)
Albumin: 2.5 g/dL — ABNORMAL LOW (ref 3.5–5.0)
Alkaline Phosphatase: 97 U/L (ref 38–126)
Anion gap: 13 (ref 5–15)
BUN: 45 mg/dL — ABNORMAL HIGH (ref 8–23)
CO2: 23 mmol/L (ref 22–32)
Calcium: 9.1 mg/dL (ref 8.9–10.3)
Chloride: 102 mmol/L (ref 98–111)
Creatinine, Ser: 2.17 mg/dL — ABNORMAL HIGH (ref 0.44–1.00)
GFR, Estimated: 25 mL/min — ABNORMAL LOW (ref >60)
Glucose, Bld: 220 mg/dL — ABNORMAL HIGH (ref 70–99)
Potassium: 4.6 mmol/L (ref 3.5–5.1)
Sodium: 138 mmol/L (ref 135–145)
Total Bilirubin: 0.5 mg/dL (ref 0.3–1.2)
Total Protein: 6.4 g/dL — ABNORMAL LOW (ref 6.5–8.1)

## 2021-09-27 LAB — CBC WITH DIFFERENTIAL/PLATELET
Abs Immature Granulocytes: 0.04 10*3/uL (ref 0.00–0.07)
Basophils Absolute: 0 10*3/uL (ref 0.0–0.1)
Basophils Relative: 0 %
Eosinophils Absolute: 0.1 10*3/uL (ref 0.0–0.5)
Eosinophils Relative: 1 %
HCT: 28.4 % — ABNORMAL LOW (ref 36.0–46.0)
Hemoglobin: 8.7 g/dL — ABNORMAL LOW (ref 12.0–15.0)
Immature Granulocytes: 0 %
Lymphocytes Relative: 21 %
Lymphs Abs: 2.6 10*3/uL (ref 0.7–4.0)
MCH: 27.4 pg (ref 26.0–34.0)
MCHC: 30.6 g/dL (ref 30.0–36.0)
MCV: 89.3 fL (ref 80.0–100.0)
Monocytes Absolute: 1.3 10*3/uL — ABNORMAL HIGH (ref 0.1–1.0)
Monocytes Relative: 10 %
Neutro Abs: 8.4 10*3/uL — ABNORMAL HIGH (ref 1.7–7.7)
Neutrophils Relative %: 68 %
Platelets: 384 10*3/uL (ref 150–400)
RBC: 3.18 MIL/uL — ABNORMAL LOW (ref 3.87–5.11)
RDW: 14.4 % (ref 11.5–15.5)
WBC: 12.4 10*3/uL — ABNORMAL HIGH (ref 4.0–10.5)
nRBC: 0 % (ref 0.0–0.2)

## 2021-09-27 LAB — BLOOD GAS, ARTERIAL
Acid-base deficit: 1.5 mmol/L (ref 0.0–2.0)
Bicarbonate: 23.7 mmol/L (ref 20.0–28.0)
FIO2: 0.21 %
O2 Saturation: 92.3 %
Patient temperature: 37
pCO2 arterial: 41 mmHg (ref 32–48)
pH, Arterial: 7.37 (ref 7.35–7.45)
pO2, Arterial: 61 mmHg — ABNORMAL LOW (ref 83–108)

## 2021-09-27 LAB — GLUCOSE, CAPILLARY
Glucose-Capillary: 210 mg/dL — ABNORMAL HIGH (ref 70–99)
Glucose-Capillary: 257 mg/dL — ABNORMAL HIGH (ref 70–99)
Glucose-Capillary: 227 mg/dL — ABNORMAL HIGH (ref 70–99)
Glucose-Capillary: 196 mg/dL — ABNORMAL HIGH (ref 70–99)
Glucose-Capillary: 90 mg/dL (ref 70–99)
Glucose-Capillary: 212 mg/dL — ABNORMAL HIGH (ref 70–99)

## 2021-09-27 LAB — LACTIC ACID, PLASMA: Lactic Acid, Venous: 0.7 mmol/L (ref 0.5–1.9)

## 2021-09-27 LAB — CK: Total CK: 135 U/L (ref 38–234)

## 2021-09-27 LAB — AMMONIA: Ammonia: <10 umol/L (ref 9–35)

## 2021-09-27 LAB — HEMOGLOBIN A1C
Hgb A1c MFr Bld: 8 % — ABNORMAL HIGH (ref 4.8–5.6)
Mean Plasma Glucose: 182.9 mg/dL

## 2021-09-27 LAB — MAGNESIUM: Magnesium: 2.2 mg/dL (ref 1.7–2.4)

## 2021-09-27 LAB — TSH: TSH: 3.32 u[IU]/mL (ref 0.350–4.500)

## 2021-09-27 MED ORDER — ACETAMINOPHEN 325 MG PO TABS
650.0000 mg | ORAL_TABLET | Freq: Four times a day (QID) | ORAL | Status: DC | PRN
  Administered 2021-09-28: 650 mg via ORAL
  Filled 2021-09-27: qty 2

## 2021-09-27 MED ORDER — ONDANSETRON HCL 4 MG/2ML IJ SOLN: 4.0000 mg | Freq: Four times a day (QID) | INTRAMUSCULAR | Status: DC | PRN

## 2021-09-27 MED ORDER — ONDANSETRON HCL 4 MG PO TABS: 4.0000 mg | ORAL_TABLET | Freq: Four times a day (QID) | ORAL | Status: DC | PRN

## 2021-09-27 MED ORDER — AMLODIPINE BESYLATE 5 MG PO TABS
5.0000 mg | ORAL_TABLET | Freq: Every day | ORAL | Status: DC
  Administered 2021-09-27 – 2021-09-28 (×2): 5 mg via ORAL
  Filled 2021-09-27 (×2): qty 1

## 2021-09-27 MED ORDER — LACTATED RINGERS IV SOLN: INTRAVENOUS | Status: AC

## 2021-09-27 MED ORDER — ENOXAPARIN SODIUM 30 MG/0.3ML IJ SOSY
30.0000 mg | PREFILLED_SYRINGE | INTRAMUSCULAR | Status: DC
  Administered 2021-09-27 – 2021-09-29 (×3): 30 mg via SUBCUTANEOUS
  Filled 2021-09-27 (×3): qty 0.3

## 2021-09-27 MED ORDER — ACETAMINOPHEN 650 MG RE SUPP: 650.0000 mg | Freq: Four times a day (QID) | RECTAL | Status: DC | PRN

## 2021-09-27 MED ORDER — INSULIN ASPART 100 UNIT/ML IJ SOLN
0.0000 [IU] | Freq: Three times a day (TID) | INTRAMUSCULAR | Status: DC
  Administered 2021-09-27: 3 [IU] via SUBCUTANEOUS
  Administered 2021-09-28: 12:00:00 8 [IU] via SUBCUTANEOUS
  Administered 2021-09-28: 5 [IU] via SUBCUTANEOUS
  Administered 2021-09-28: 16:00:00 2 [IU] via SUBCUTANEOUS
  Administered 2021-09-29: 10:00:00 3 [IU] via SUBCUTANEOUS
  Administered 2021-09-29: 12:00:00 5 [IU] via SUBCUTANEOUS
  Filled 2021-09-27 (×7): qty 1

## 2021-09-27 MED ORDER — HYDRALAZINE HCL 50 MG PO TABS
25.0000 mg | ORAL_TABLET | Freq: Three times a day (TID) | ORAL | Status: DC
  Administered 2021-09-27 – 2021-09-28 (×3): 25 mg via ORAL
  Filled 2021-09-27 (×3): qty 1

## 2021-09-27 MED ORDER — SODIUM CHLORIDE 0.9 % IV SOLN
2.0000 g | INTRAVENOUS | Status: DC
  Administered 2021-09-27: 2 g via INTRAVENOUS
  Filled 2021-09-27: qty 2
  Filled 2021-09-27: qty 20

## 2021-09-27 MED ORDER — INSULIN ASPART 100 UNIT/ML IJ SOLN
0.0000 [IU] | Freq: Every day | INTRAMUSCULAR | Status: DC
  Administered 2021-09-27: 2 [IU] via SUBCUTANEOUS
  Filled 2021-09-27: qty 1

## 2021-09-27 NOTE — Assessment & Plan Note (Addendum)
Now with some low pO2 61, CXR with possible LLL infiltrate, hypothermia and WBC 12.   Sepsis ruled out. - Continue antibiotics with cefdinir

## 2021-09-27 NOTE — Progress Notes (Signed)
At 1650 patient hd distended abdomen, no void, and stated she did not need to void.  Patient was bladder scanned and volume reading was 633mL, Dr. Loleta Books notified and ordered to in and out cath.  Patient tolerated in and out cath well and had 83mL output.  Durene Fruits second person present.

## 2021-09-27 NOTE — Progress Notes (Signed)
D: Pt is alert, however, disoriented to situation. Pt denies experiencing any anxiety/depression at this time. Pt denies experiencing any pain at this time. Pt denies experiencing any SI/HI, or AVH at this time.     A: Scheduled medications administered to patient, per MD orders. Support and encouragement provided. Routine safety checks conducted q15 minutes.  R: No adverse drug reactions noted. Patient complaint with medications. Pt interacts minimally with others on the unit. Pt remains safe at this time. Will continue to monitor.

## 2021-09-27 NOTE — ED Notes (Signed)
Per Dr. Leonel Ramsay called and cancelled code stroke to [facility] 0830

## 2021-09-27 NOTE — Consult Note (Addendum)
Neurology Consultation Reason for Consult: Decreased responsiveness Referring Physician: Danford, C  CC: Decreased responsiveness  History is obtained from: Chart review  HPI: Alicia Rogers is a 61 y.o. female with a history of schizophrenia who was noncompliant with her medications and therefore brought in by her family.  Yesterday, it was noted that she had some increased tone and abnormal posturing of her extremities and therefore she was started on Ativan with concern for catatonia with significant improvement over the course of the day.  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  She was found to be less responsive this morning, given some concern for focal findings a code stroke was activated.  On my examination, she is arousable, temperature was noted to be 94 degrees.  Glucose was 257.  Last night, it was noted that she had an unsteady gait around 3 AM after a fall.  Head CT at the time was normal.   LKW: Unclear, prior to unsteady gait being noted at 3 AM tpa given?: no, unclear time of onset    ROS: Unable to assess secondary to patient's altered mental status.   Past Medical History:  Diagnosis Date   Depression    Diabetes mellitus without complication ([place])    Diabetes mellitus, type II ([place])    Hypertension    Schizophrenia ([place])      Family History  Problem Relation Age of Onset   Diabetes Mother    Glaucoma Mother    Heart disease Mother    Heart attack Mother    Depression Mother    Hypertension Mother    Diabetes Father    Glaucoma Father    Hypertension Father    Diabetes Sister    Depression Sister    Post-traumatic stress disorder Sister      Social History:  reports that she has been smoking cigarettes. She started smoking about 43 years ago. She has been smoking an average of 1 pack per day. She has never used smokeless tobacco. She reports that she does not drink alcohol and does not use  drugs.   Exam: Current vital signs: BP (!) 190/78    Pulse (!) 52    Resp 10    SpO2 98%  Vital signs in last 24 hours: Temp:  [97.5 F (36.4 C)-99 F (37.2 C)] 97.5 F (36.4 C) (02/26 0319) Pulse Rate:  [51-81] 52 (02/26 0851) Resp:  [10-18] 10 (02/26 0851) BP: (120-190)/(51-78) 190/78 (02/26 0851) SpO2:  [97 %-100 %] 98 % (02/26 0851)   Physical Exam  Constitutional: Appears well-developed and well-nourished.  Psych: Affect appropriate to situation Eyes: No scleral injection HENT: No OP obstruction MSK: no joint deformities.  Cardiovascular: Normal rate and regular rhythm.  Respiratory: Effort normal, non-labored breathing GI: Soft.  No distension. There is no tenderness.  Skin: WDI  Neuro: Mental Status: Patient is lethargic but arousable, she is able to tell me the month is February, she initially answer 60 to her age, but corrects her self to 64.  She is not very cooperative with answering questions or cooperating with exam. Cranial Nerves: II: She count fingers in both hemifield's. Pupils are equal, round, and reactive to light.   III,IV, VI: EOMI without ptosis or diploplia.  V: Facial sensation is symmetric to temperature VII: I do question a mild left facial weakness, looking at her epic picture it appears there to be some slight asymmetry at baseline Motor: She is able  to hold both arms against gravity, she has significantly increased tone bilaterally.  She holds her arms in the positions I placed them in momentarily,?  Some waxy flexibility already, but then she puts them down.  She has markedly diminished movement in the left wrist. 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  She is on gabapentin, and I suspect that a lot of what we are seeing represents medication effect including gabapentin building up because of [name], larger than expected benzo effect because of [name].  With her increased tone, neuroleptic malignant syndrome would be one other consideration, though she is not febrile and therefore I will check CK.  Recommendations: 1) CK 2) limit sedating medications 3) psychiatric medications per psychiatry. 4) management of [name] per internal medicine   Roland Rack, MD [facility] [phone]  If 7pm- 7am, please page neurology on call as listed in [place].

## 2021-09-27 NOTE — Assessment & Plan Note (Addendum)
Suspect this is multifactorial due to increased gabapentin in setting of worsening renal function, high dose ativan and other psychotropics including recent [place], now compounded by LLL pneumonia and hypoxia.    Ammonia normal.  pCO2 normal.  Doubt this is hypertensive.  - Hold psychotropics, baclofen, gabapentin - Consult Psychiatry, is this related to [place]?  - Treat pneumonia

## 2021-09-27 NOTE — Assessment & Plan Note (Signed)
Hgb stable 

## 2021-09-27 NOTE — Assessment & Plan Note (Addendum)
Resolved with holding baclofen, gabapentin

## 2021-09-27 NOTE — Progress Notes (Signed)
°   09/27/21 0319  What Happened  Was fall witnessed? No  Was patient injured? No  Patient found on floor  Found by Staff-comment (RN)  Stated prior activity bathroom-unassisted  Follow Up  MD notified Paliy  Time MD notified [phone]  Additional tests Yes-comment  Adult Fall Risk Assessment  Risk Factor Category (scoring not indicated) History of more than one fall within 6 months before admission (document High fall risk);Fall has occurred during this admission (document High fall risk);High fall risk per protocol (document High fall risk)  Age 61  Fall History: Fall within 6 months prior to admission 5  Elimination; Bowel and/or Urine Incontinence 0  Elimination; Bowel and/or Urine Urgency/Frequency 0  Medications: includes PCA/Opiates, Anti-convulsants, Anti-hypertensives, Diuretics, Hypnotics, Laxatives, Sedatives, and Psychotropics 3  Patient Care Equipment 1  Mobility-Assistance 2  Mobility-Gait 2  Mobility-Sensory Deficit 0  Altered awareness of immediate physical environment 1  Impulsiveness 0  Lack of understanding of one's physical/cognitive limitations 0  Total Score 15  Patient Fall Risk Level High fall risk  Adult Fall Risk Interventions  Required Bundle Interventions *See Row Information* High fall risk - low, moderate, and high requirements implemented  Screening for Fall Injury Risk (To be completed on HIGH fall risk patients) - Assessing Need for Floor Mats  Risk For Fall Injury- Criteria for Floor Mats None identified - No additional interventions needed  Vitals  Temp (!) 97.5 F (36.4 C)  Temp Source Oral  BP (!) 128/51  BP Location Right Arm  BP Method Automatic  Patient Position (if appropriate) Sitting  Pulse Rate (!) 53  Pulse Rate Source Dinamap  Resp 17  Oxygen Therapy  SpO2 100 %  O2 Device Room Air  Pain Assessment  Pain Scale 0-10  Pain Score 0  Neurological  Neuro (WDL) X  Level of Consciousness Alert  Orientation Level Disoriented to  situation  Musculoskeletal  Musculoskeletal (WDL) X  Assistive Device Front wheel walker  Generalized Weakness Yes  Weight Bearing Restrictions No  Integumentary  Integumentary (WDL) X (pressure ulcer on R. hip and L. buttocks)  Skin Condition Dry;Flaky

## 2021-09-27 NOTE — Progress Notes (Signed)
°  Chaplain On-Call responded to Rapid Response notification, followed shortly by Code Stroke notification.  Per RN Vinnie Level, patient fell overnight, and is non-responsive.  Medical Team is attending to her, with their intent of moving her for CT scan procedures. Present are Malachy Mood the Contractor; Joellen Jersey from ICU, and Hospitalist Dr. Loleta Books.  Chaplains are available for additional support if requested.  Chaplain Pollyann Samples M.Div., [facility]

## 2021-09-27 NOTE — Discharge Summary (Signed)
Physician Discharge Summary Note  Patient:  Alicia Rogers is an 61 y.o., female MRN:  [id number] DOB:  September 09, 1960 Patient phone:  [phone] (home)  Patient address:   [address] [place] [address],    Date of Admission:  09/21/2021 Date of Discharge: 09/27/2021  Reason for Admission:  61yo female with psych h/o schizoaffective disorder, was admitted to [facility] unit due to non-compliance with psych medications and not taking care of self.  Principal Problem: Schizoaffective disorder, depressive type [facility]) Discharge Diagnoses: Principal Problem:   Schizoaffective disorder, depressive type ([place])   Past Psychiatric History: h/o schizoaffective disorder. Patient does report that she has a outpatient psychiatrist at [facility].  Past Medical History:  Past Medical History:  Diagnosis Date   Depression    Diabetes mellitus without complication ([place])    Diabetes mellitus, type II ([place])    Hypertension    Schizophrenia ([place])    History reviewed. No pertinent surgical history. Family History:  Family History  Problem Relation Age of Onset   Diabetes Mother    Glaucoma Mother    Heart disease Mother    Heart attack Mother    Depression Mother    Hypertension Mother    Diabetes Father    Glaucoma Father    Hypertension Father    Diabetes Sister    Depression Sister    Post-traumatic stress disorder Sister    Family Psychiatric  History: no pertinent history Social History:  Social History   Substance and Sexual Activity  Alcohol Use No   Alcohol/week: 0.0 standard drinks     Social History   Substance and Sexual Activity  Drug Use No    Social History   Socioeconomic History   Marital status: Single    Spouse name: Not on file   Number of children: Not on file   Years of education: Not on file   Highest education level: Not on file  Occupational History   Not on file  Tobacco Use   Smoking status: Every Day    Packs/day: 1.00     Types: Cigarettes    Start date: 04/21/1978   Smokeless tobacco: Never  Vaping Use   Vaping Use: Never used  Substance and Sexual Activity   Alcohol use: No    Alcohol/week: 0.0 standard drinks   Drug use: No   Sexual activity: Never  Other Topics Concern   Not on file  Social History Narrative   Not on file   Social Determinants of Health   Financial Resource Strain: Not on file  Food Insecurity: Not on file  Transportation Needs: Not on file  Physical Activity: Not on file  Stress: Not on file  Social Connections: Not on file    Hospital Course:   Alicia Rogers is a 61 year old African-American female who presented on an involuntary commitment after being dropped off by her brother with whom she was staying.  Apparently, she has not been taking her medications.  She admitted that.  She reported hearing voices but denied any suicidal ideation.  She reported depression and chronic pain.  She apparently has not been taking any of her psychiatric or medical medications.    The patient was admitted to the inpatient psychiatric unit due to symptoms of psychosis, depression, and not taking care of self. She was restricted to ward and placed on suicide precautions. Patient was introduced to milieu activities and encouraged to participate in psycho-social groups. The plan at the time  of admission was safety, stabilization and treatment. Her bloodwork on admission was remarkable for abnormal kidney function and hyperglycemia. Patient was treated with a combination of medication, therapy, and some educational activities during her hospital stay. For the management of psychotic disorder, patient was started on oral Paliperidone 64m p.o. daily initially, and second medication Lexapro 173mdaily for depression. She was restarted on Insulin for the management of diabetes, Lipitor for HLD, Gabapentin for pain, Losartan for HTN, and Trazodone for sleep. Cardizem was started for BP control. During the  course of hospitalization, patient continued to appear internally preoccupied and stayed very withdrawn to her room.  She was not eating and drinking very much.  Her lab work came back with her glucose improved but her BUN and creatinine continued to rise, she was also showing decreased protein and GFR. Her blood pressure remained mostly high. 2/25 Nursing reported that the patient appeared more lethargic. On assessment patient was very withdrawn and was not participating in the interview. She was awake, but very flat, unemotional, no eye contact. She was able to follow simple commands.Her movements were very slow; some posturing on exam. All the above, including hypomobility, mutism, some posturing, was concerning for catatonia, so the Ativan was ordered. Patient seen later during the day improved somewhat, in the community area, eating without assistance.  2/26 3:09am patient found laying/sleeping on the floor beside her bed during safety check. Patient was counscious, denied any injury or experiencing any pain at this time, appearer disoriented to the situation. Vital signs obtained: 128/51, 53, 17, 97.5, 100%RA. Head CT ordered - No acute intracranial abnormalities. The appearance of the brain is normal. Around 7.30am the RN found a patient lethargic, almost unresponsive. VS remarkable for bradycardia at 51, BP 130/53. POC Blood sugar 257. RN instructed to draw blood STAT (CBC, CMP, lytes) and call the Rapid Response team in case of worsening vitals/mentation. Discussed the patient with Dr. DaLoleta Rogers hospitalist, who examined the patient this morning with Rapid response team. The patient is transferred to ICU for medical workup. Patient will be followed by Psychiatry Consultation service while in ICU.  Physical Findings: AIMS:  , ,  ,  ,    CIWA:    COWS:      Physical Exam: Physical Exam ROS Blood pressure 120/61, pulse (!) 55, temperature (!) 97.5 F (36.4 C), temperature source Oral, resp.  rate 17, height _0  (1.6 m), weight 51.7 kg, SpO2 98 %. Body mass index is 20.19 kg/m.   Social History   Tobacco Use  Smoking Status Every Day   Packs/day: 1.00   Types: Cigarettes   Start date: 04/21/1978  Smokeless Tobacco Never   Tobacco Cessation:  N/A, patient does not currently use tobacco products   Blood Alcohol level:  Lab Results  Component Value Date   ETH <10 09/20/2021   ETH <10 0361/95/0932  Metabolic Disorder Labs:  Lab Results  Component Value Date   HGBA1C 7.9 (H) 10/13/2020   MPG 180.03 10/13/2020   MPG 180.03 06/09/2020   No results found for: PROLACTIN Lab Results  Component Value Date   CHOL 267 (H) 10/13/2020   TRIG 180 (H) 10/13/2020   HDL 86 10/13/2020   CHOLHDL 3.1 10/13/2020   VLDL 36 10/13/2020   LDLCALC 145 (H) 10/13/2020   LDLCALC 158 (H) 02/29/2020    See Psychiatric Specialty Exam and Suicide Risk Assessment completed by Attending Physician prior to discharge.  [facility]  Is patient on multiple antipsychotic therapies at discharge:  No   Has Patient had three or more failed trials of antipsychotic monotherapy by history:  No  Recommended Plan for Multiple Antipsychotic Therapies: NA   Allergies as of 09/27/2021       Reactions   Penicillins    Rash        Medication List     STOP taking these medications    Accu-Chek FastClix Lancets Misc   Accu-Chek SmartView test strip Generic drug: glucose blood   acetaminophen 325 MG tablet Commonly known as: TYLENOL   amLODipine 10 MG tablet Commonly known as: NORVASC   aspirin 81 MG EC tablet   atorvastatin 80 MG tablet Commonly known as: LIPITOR   Baclofen 5 MG Tabs   Fifty50 Glucose Meter 2.0 w/Device Kit   gabapentin 100 MG capsule Commonly known as: Neurontin   gabapentin 300 MG capsule Commonly known as: NEURONTIN   hydrochlorothiazide 12.5 MG tablet Commonly known as: HYDRODIURIL   hydrOXYzine 25 MG tablet Commonly known  as: ATARAX   [place] injection Generic drug: Paliperidone ER   lidocaine 5 % Commonly known as: Lidoderm   loperamide 2 MG capsule Commonly known as: IMODIUM   losartan 100 MG tablet Commonly known as: COZAAR   metFORMIN 500 MG 24 hr tablet Commonly known as: Glucophage XR   metFORMIN 500 MG tablet Commonly known as: GLUCOPHAGE   omeprazole 20 MG capsule Commonly known as: PriLOSEC   ondansetron 4 MG disintegrating tablet Commonly known as: Zofran ODT   oxybutynin 15 MG 24 hr tablet Commonly known as: Ditropan XL   oxyCODONE-acetaminophen 5-325 MG tablet Commonly known as: PERCOCET/ROXICET   predniSONE 50 MG tablet Commonly known as: DELTASONE   ReliOn Lancing Device Misc   traZODone 150 MG tablet Commonly known as: DESYREL   traZODone 50 MG tablet Commonly known as: DESYREL       ASK your doctor about these medications      Indication  lurasidone 40 MG Tabs tablet Commonly known as: Latuda Take 1 tablet (40 mg total) by mouth daily with breakfast.    sitaGLIPtin 100 MG tablet Commonly known as: JANUVIA Take 1 tablet (100 mg total) by mouth daily.          Signed: Larita Fife, MD 09/27/2021, 8:59 AM

## 2021-09-27 NOTE — Hospital Course (Signed)
61 yo F with HTN, DM, CKD IIIa baseline ~2, depression and schizophrenia admitted to [facility] 4 days prior to this medical admission due to decompensated schizophrenia, paranoia and noncompliance.    In the ER, EDP unclear who IVC'd the patient.  Patient admitted to noncompliance.  Complained of left arm and hand pain, chronic.  Endorsed some auditory hallucinations.  Was in treatment in [place] for several days, withdrawn per notes, but interactive.  On day prior to admission, she seemed more sluggish to nursing, not taking much PO, and had very flat, unemotional affect, with slow arm movements and some odd arm posturing.  Cr had been ~2, which may be her baseline.  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  Overnight, she fell around 3AM, had some ataxic gait per RN notes.   CTH obtained, normal.  LKN 5AM.  Around 7AM, patient was nonresponsive to sternal rub, appeared flaccid in all 4, nursing felt her left face was drooping.  CODE STROKE called.

## 2021-09-27 NOTE — Assessment & Plan Note (Addendum)
-  Hold Ativan  -Hold hydroxyzine, baclofen, oxybutynin, trazodone - Consult Psychiatry, is this a consequence of [place]?

## 2021-09-27 NOTE — Progress Notes (Addendum)
D: Writer found patient laying/sleeping on the floor beside her bed at 0309 during safety check. Pt denies any injury or experiencing any pain at this time, appears disoriented to the situation. Vital signs obtained: 128/51, 53, 17, 97.5, 100%RA. MDOC 'Dr. Danella Sensing' notified.  A:  Patient with two staff assist to  bathroom/hygiene care, and back to bed. Stat CT of head ordered. R: No apparent injury noted, gait observed as unsteady. Stat CT scan completed. Will continue to monitor.

## 2021-09-27 NOTE — H&P (Signed)
History and Physical    Patient: Alicia Rogers WPY:[id number] DOB: 1961-07-07 DOA: 09/27/2021 DOS: the patient was seen and examined on 09/27/2021 PCP: Marygrace Drought, MD  Patient coming from: Behavioral health  Chief Complaint: Unresponsive      HPI:  61 yo F with HTN, DM, CKD IIIa baseline ~2, depression and schizophrenia admitted to [facility] 4 days prior to this medical admission due to decompensated schizophrenia, paranoia and noncompliance.    In the ER, EDP unclear who IVC'd the patient.  Patient admitted to noncompliance.  Complained of left arm and hand pain, chronic.  Endorsed some auditory hallucinations.  Was in treatment in [place] for several days, withdrawn per notes, but interactive.  On day prior to admission, she seemed more sluggish to nursing, not taking much PO, and had very flat, unemotional affect, with slow arm movements and some odd arm posturing.  Cr had been ~2, which may be her baseline.  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  Overnight, she fell around 3AM, had some ataxic gait per RN notes.   CTH obtained, normal.  LKN 5AM.  Around 7AM, patient was nonresponsive to sternal rub, appeared flaccid in all 4, nursing felt her left face was drooping.  CODE STROKE called.       Review of Systems: unable to review all systems due to the inability of the patient to answer questions.     Past Medical History:  Diagnosis Date   Depression    Diabetes mellitus without complication ([place])    Diabetes mellitus, type II ([place])    Hypertension    Schizophrenia ([place])    No past surgical history on file.  Social History:  reports that she has been smoking cigarettes. She started smoking about 43 years ago. She has been smoking an average of 1 pack per day. She has never used smokeless tobacco. She reports that she does not drink alcohol and does not use drugs.  Allergies  Allergen Reactions   Penicillins     Rash    Family  History  Problem Relation Age of Onset   Diabetes Mother    Glaucoma Mother    Heart disease Mother    Heart attack Mother    Depression Mother    Hypertension Mother    Diabetes Father    Glaucoma Father    Hypertension Father    Diabetes Sister    Depression Sister    Post-traumatic stress disorder Sister     Prior to Admission medications   Medication Sig Start Date End Date Taking? Authorizing Provider  amLODipine (NORVASC) 5 MG tablet Take 5 mg by mouth 2 (two) times daily. 07/08/21   [provider]  lurasidone (LATUDA) 40 MG TABS tablet Take 1 tablet (40 mg total) by mouth daily with breakfast. Patient not taking: Reported on 09/21/2021 07/02/20   Carrie Mew, MD  sitaGLIPtin (JANUVIA) 100 MG tablet Take 1 tablet (100 mg total) by mouth daily. 07/02/20   Carrie Mew, MD    Physical Exam: Vitals:   09/27/21 1000 09/27/21 1100 09/27/21 1200 09/27/21 1300  BP: 117/69 (!) 112/45 (!) 127/45 (!) 138/58  Pulse: (!) 52 (!) 49 (!) 58 (!) 54  Resp: 10 11 12 11   Temp:   98.1 F (36.7 C)   TempSrc:   Oral   SpO2: 97% 99% 100% 99%   General appearance: Small stature adult female, appears in poor health, lying in bed, unresponsive     HEENT: Pupils round  and reactive to light, conjunctive a pink, lids and lashes normal.  No nasal deformity, discharge, or epistaxis.  Does not open mouth to command, lips appear dry Skin: No suspicious rashes or lesions on the face, breast, abdomen, arms, or legs Cardiac: RRR, no murmurs, no lower extremity edema, no JVD Respiratory: Normal respiratory rate and rhythm, poor inspiratory effort, lung sounds diminished, but no rales or wheezes appreciated Abdomen: Abdomen without distention, rigidity, or grimace to palpation, no masses appreciated MSK: Mildly reduced subcutaneous muscle mass and fat diffusely, no deformities or effusions Neuro: Initially on my exam she is sluggish, does not respond to commands, withdraws from stimuli,  makes no spontaneous movements or verbalizations, does not follow commands.  On later exam, she gives one-word answers and close rises and falls back asleep.  She seems to have somewhat increased or normal tone in the upper extremities, resist bilaterally, does not resist with legs although this is difficult to examine. Psych: Unable to assess, catatonic.   Data Reviewed: Labs and imaging personally reviewed, showed ABG with PO2 61, normal pH and PCO2 Patient metabolic panel notable for creatinine 2.1, hyperglycemia, LFTs normal, other electrolytes normal CK normal Complete blood count with new leukocytosis, hemoglobin 8.7, stable from previous, platelets normal, differential normal TSH normal CT head no new changes Chest x-ray personally reviewed, shows some blunting of the left costophrenic angle, suspect this is an infiltrate ECG personally reviewed, shows sinus bradycardia Discussed with neurology         Assessment and Plan: * Acute metabolic encephalopathy- (present on admission) Suspect this is multifactorial due to increased gabapentin in setting of worsening renal function, high dose ativan and other psychotropics including recent [place], now compounded by LLL pneumonia and hypoxia.    Ammonia normal.  pCO2 normal.  - Hold psychotropics - Consult Psychiatry, is this related to [place] sustenna? - IV fluids - Treat pneumonia      Left lower lobe pneumonia Now with some low pO2 61, CXR with possible LLL infiltrate, hypothermia and WBC 12.   Doubt sepsis. 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  Schizoaffective disorder, depressive type ([place])- (present on admission) See above Recently got [place] - Consult Psycahitry  Left arm  swelling Recent US negative for DVT.  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 - Outpatient Neuro follow  Anemia in chronic kidney disease (CKD) Hgb stable.  Sinus bradycardia Monitor telemetry, BP normal, asymptomatic.  CKD (chronic kidney disease), stage IV ([place])- (present on admission) Cr prior to admission 1.2-1.5 last year, during this admission, has been 1.7-2.2, probably due to progression of CKD due to untreated HTN.   - Hold losartan - Continue amlodipine - IV fluids and trend Cr  Essential hypertension- (present on admission) BP elevated. -Continue amlodipine -Hold losartan for now - Start oral hydralazine for now  Controlled type 2 diabetes mellitus without complication (HCC) Glucose elevated - Hold home sitagliptin - Start SS corrections       Advance Care Planning:   Code Status: Full Code   Consults: Neurology  Family Communication: Sister by phone  Severity of Illness: The appropriate patient status for this patient is OBSERVATION. Observation status is judged to be reasonable and necessary in order to provide the required intensity of service to ensure the patient's safety. The  patient's presenting symptoms, physical exam findings, and initial radiographic and laboratory data in the context of their medical condition is felt to place them at decreased risk for further clinical deterioration. Furthermore, it is anticipated that the patient will be medically stable for discharge from the hospital within 2 midnights of admission.   Author: Edwin Dada, MD 09/27/2021 3:54 PM  For on call review [email or web address].

## 2021-09-27 NOTE — Assessment & Plan Note (Addendum)
Glucose normal - Hold home sitagliptin - Continue SS corrections

## 2021-09-27 NOTE — Progress Notes (Signed)
Patient ID: Alicia Rogers, female   DOB: [birth year], 61 y.o.   MRN: [id number]    Got a phone call from RN around 7.30am that the found a patient lethargic, almost unresponsive. VS remarkable for bradycardia at 51, BP 130/53. POC Blood sugar 257. RN instructed to draw blood STAT (CBC, CMP, lytes) and call the Rapid Response team in case of worsening vitals/mentation.   Discussed the patient with Dr. Loleta Books, the hospitalist, who examined the patient this morning with Rapid response team. The patient is transferred to ICU for medical workup.

## 2021-09-27 NOTE — Assessment & Plan Note (Signed)
>>  ASSESSMENT AND PLAN FOR CKD (CHRONIC KIDNEY DISEASE), STAGE IV ([place]) WRITTEN ON 09/28/2021  5:08 PM BY DANFORD, CHRISTOPHER P, MD  Cr prior to admission 1.2-1.5 last year, during this admission, has been 1.7-2.2, probably due to progression of CKD due to untreated HTN.   - Hold losartan - Continue amlodipine, add HCTZ, hydralazine - IV fluids and trend Cr

## 2021-09-27 NOTE — Plan of Care (Signed)
Continuing with plan of care. 

## 2021-09-27 NOTE — Significant Event (Signed)
Rapid Response Event Note   Reason for Call : called RRT for unresponsivness, facial droop, hx of fall overnight.   Initial Focused Assessment: laying in bed, bradycardic on monitor, arouses to pain, right side weakness noted.      Interventions: Dr Loleta Books to bedside, code stroke called, stat head ct... neurologist met in ct... cancelled code stroke as pt was more arousable. Ct negative. Dr Loleta Books to admit to medical floor for further monitoring. No medical bed open, taken to ICU 6 until bed becomes available.   Plan of Care: as above, report from gero-psych nurse given to [facility], ICU RN    Event Summary: as above  MD Notified: Dr Loleta Books [place] Call [place], RN

## 2021-09-27 NOTE — Assessment & Plan Note (Signed)
>>  ASSESSMENT AND PLAN FOR CONTROLLED TYPE 2 DIABETES MELLITUS WITHOUT COMPLICATION ([place]) WRITTEN ON 09/28/2021  5:08 PM BY DANFORD, CHRISTOPHER P, MD  Glucose normal - Hold home sitagliptin - Continue SS corrections

## 2021-09-27 NOTE — Progress Notes (Addendum)
°  [facility] Adult Case Management Discharge Plan :  Will you be returning to the same living situation after discharge:  No. N/A Patient transferred to Medical floor <ICU>  At discharge, do you have transportation home?: N/A  Do you have the ability to pay for your medications: Yes,  [employer]  Release of information consent forms completed and in the chart;  Patient's signature needed at discharge.  Patient to Follow up at: N/A Patient transferred to Medical floor <ICU>   Next level of care provider has access to [place] and Suicide Prevention discussed: Yes,  SPE completed with patient and [name], sister   Has patient been referred to the Quitline?: Patient refused referral  Patient has been referred for addiction treatment: Pt. refused referral  Durenda Hurt, [place] 09/27/2021, 10:20 AM

## 2021-09-27 NOTE — Care Plan (Signed)
Called to bedside for rapid response.  61 yo F with HTN, DM, CKD IIIa baseline ~2, depression and schizophrenia admitted to [facility].  Patient fell last night around 3am.  Had [place] unremarkable.  Went to sleep around 5am, LKN.  This morning at 7am, nursing found her unresponsive. CBG 257.  Pupils responsive and grimaces but does not follow commands at all.  GCS 8?  Nursing believe there is some new left facial droop.      Stat head CT.   Call CODE STROKE Check stat ABG, CBC, CMP, EKG

## 2021-09-27 NOTE — Progress Notes (Signed)
Patient was difficult to arouse and was not responsive when this RN and MHT came to take her vital signs and CBG. Dr. Danella Sensing was notified and RRT was called. Patient was transferred to ICU. Patient's sister, [name], was notified.

## 2021-09-27 NOTE — Assessment & Plan Note (Signed)
See above Recently got [place] - Consult Psycahitry

## 2021-09-27 NOTE — Progress Notes (Addendum)
Patient ID: Alicia Rogers, female   DOB: 04/20/1961, 61 y.o.   MRN: [id number]  The unit RN called at 3:51am and reported, that patient had an unwitnessed fall. She was found on the bathroom floor during rounds. The patient reportedly was conscious when she was found, but she could not tell if she hit her head or not. It looks like she was trying to use a commode and fell. Vital signes were stable, Head CT without contrast ordered.   Addendum 6:43am: Head CT completed. No acute intracranial abnormalities. The appearance of the brain is normal.

## 2021-09-27 NOTE — Assessment & Plan Note (Addendum)
Recently evaluated by MRI brain which was unremarkable, left upper extremity ultrasound which was negative for DVT.  Has follow-up with Dr. Melrose Nakayama who evaluated her and has referred her to NCS/EMG to evaluate her peripheral nerve palsy and also vascular surgery to evaluate the swelling.  Of note, some notes indicate this was initiated after COVID booster  I discussed with vascular surgery.  They recommended CT venogram on a nonurgent basis.  Given her renal function I think it would be prudent to defer this until after the patient is well-established with nephrology - Follow-up with nephrology - She has an outpatient referral to vascular surgery already pending, she should keep this - Outpatient Neuro follow up and keep appointment for EMG/NCS

## 2021-09-27 NOTE — Assessment & Plan Note (Addendum)
Cr prior to admission 1.2-1.5 last year, during this admission, has been 1.7-2.2, probably due to progression of CKD due to untreated HTN.   - Hold losartan - Continue amlodipine, add HCTZ, hydralazine - IV fluids and trend Cr

## 2021-09-27 NOTE — ED Notes (Signed)
Called Code Stroke to Marcelino Freestone, per [employer]  and Dr. Leonor Liv [phone]

## 2021-09-27 NOTE — Assessment & Plan Note (Addendum)
BP labile, mostly elevated -Continue amlodipine, hydralazine, increase doses -Hold losartan for now - Start HCTZ

## 2021-09-28 DIAGNOSIS — G9341 Metabolic encephalopathy: Secondary | ICD-10-CM | POA: Diagnosis not present

## 2021-09-28 DIAGNOSIS — I160 Hypertensive urgency: Secondary | ICD-10-CM

## 2021-09-28 LAB — BASIC METABOLIC PANEL
Anion gap: 11 (ref 5–15)
BUN: 43 mg/dL — ABNORMAL HIGH (ref 8–23)
CO2: 26 mmol/L (ref 22–32)
Calcium: 8.6 mg/dL — ABNORMAL LOW (ref 8.9–10.3)
Chloride: 103 mmol/L (ref 98–111)
Creatinine, Ser: 2.2 mg/dL — ABNORMAL HIGH (ref 0.44–1.00)
GFR, Estimated: 25 mL/min — ABNORMAL LOW (ref >60)
Glucose, Bld: 296 mg/dL — ABNORMAL HIGH (ref 70–99)
Potassium: 4.9 mmol/L (ref 3.5–5.1)
Sodium: 140 mmol/L (ref 135–145)

## 2021-09-28 LAB — CBC
HCT: 30.7 % — ABNORMAL LOW (ref 36.0–46.0)
Hemoglobin: 9.6 g/dL — ABNORMAL LOW (ref 12.0–15.0)
MCH: 27.7 pg (ref 26.0–34.0)
MCHC: 31.3 g/dL (ref 30.0–36.0)
MCV: 88.7 fL (ref 80.0–100.0)
Platelets: 428 10*3/uL — ABNORMAL HIGH (ref 150–400)
RBC: 3.46 MIL/uL — ABNORMAL LOW (ref 3.87–5.11)
RDW: 14.6 % (ref 11.5–15.5)
WBC: 11.5 10*3/uL — ABNORMAL HIGH (ref 4.0–10.5)
nRBC: 0 % (ref 0.0–0.2)

## 2021-09-28 LAB — GLUCOSE, CAPILLARY
Glucose-Capillary: 220 mg/dL — ABNORMAL HIGH (ref 70–99)
Glucose-Capillary: 256 mg/dL — ABNORMAL HIGH (ref 70–99)
Glucose-Capillary: 136 mg/dL — ABNORMAL HIGH (ref 70–99)
Glucose-Capillary: 153 mg/dL — ABNORMAL HIGH (ref 70–99)

## 2021-09-28 LAB — HIV ANTIBODY (ROUTINE TESTING W REFLEX): HIV Screen 4th Generation wRfx: NONREACTIVE

## 2021-09-28 MED ORDER — AMLODIPINE BESYLATE 5 MG PO TABS
5.0000 mg | ORAL_TABLET | Freq: Once | ORAL | Status: AC
  Administered 2021-09-28: 5 mg via ORAL
  Filled 2021-09-28: qty 1

## 2021-09-28 MED ORDER — POLYETHYLENE GLYCOL 3350 17 G PO PACK
17.0000 g | PACK | Freq: Every day | ORAL | Status: DC | PRN
  Administered 2021-09-28: 17 g via ORAL
  Filled 2021-09-28: qty 1

## 2021-09-28 MED ORDER — HYDROCHLOROTHIAZIDE 12.5 MG PO TABS
12.5000 mg | ORAL_TABLET | Freq: Every day | ORAL | Status: DC
  Administered 2021-09-28 – 2021-09-29 (×2): 12.5 mg via ORAL
  Filled 2021-09-28 (×2): qty 1

## 2021-09-28 MED ORDER — CEFDINIR 300 MG PO CAPS
300.0000 mg | ORAL_CAPSULE | Freq: Every day | ORAL | Status: DC
  Administered 2021-09-28 – 2021-09-29 (×2): 300 mg via ORAL
  Filled 2021-09-28 (×2): qty 1

## 2021-09-28 MED ORDER — HYDRALAZINE HCL 50 MG PO TABS
50.0000 mg | ORAL_TABLET | Freq: Three times a day (TID) | ORAL | Status: DC
  Administered 2021-09-28 – 2021-09-29 (×4): 50 mg via ORAL
  Filled 2021-09-28 (×4): qty 1

## 2021-09-28 MED ORDER — AMLODIPINE BESYLATE 10 MG PO TABS
10.0000 mg | ORAL_TABLET | Freq: Every day | ORAL | Status: DC
  Administered 2021-09-29: 10 mg via ORAL
  Filled 2021-09-28: qty 1

## 2021-09-28 MED ORDER — CHLORHEXIDINE GLUCONATE CLOTH 2 % EX PADS: 6.0000 | MEDICATED_PAD | Freq: Every day | CUTANEOUS | Status: DC

## 2021-09-28 NOTE — Progress Notes (Signed)
Progress Note   Patient: Alicia Rogers JYN:[id number] DOB: 1961/07/28 DOA: 09/27/2021     1 DOS: the patient was seen and examined on 09/28/2021      Brief hospital course: 61 yo F with HTN, DM, CKD IIIa baseline ~2, depression and schizophrenia admitted to [facility] 4 days prior to this medical admission due to decompensated schizophrenia, paranoia and noncompliance.    In the ER, EDP unclear who IVC'd the patient.  Patient admitted to noncompliance.  Complained of left arm and hand pain, chronic.  Endorsed some auditory hallucinations.  Was in treatment in [place] for several days, withdrawn per notes, but interactive.  On day prior to admission, she seemed more sluggish to nursing, not taking much PO, and had very flat, unemotional affect, with slow arm movements and some odd arm posturing.  Cr had been ~2, which may be her baseline.  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  Overnight, she fell around 3AM, had some ataxic gait per RN notes.   CTH obtained, normal.  LKN 5AM.  Around 7AM, patient was nonresponsive to sternal rub, appeared flaccid in all 4, nursing felt her left face was drooping.  [facility] [employer] called.      Assessment and Plan: * Acute metabolic encephalopathy- (present on admission) Suspect this is multifactorial due to increased gabapentin in setting of worsening renal function, high dose ativan and other psychotropics including recent [place], now compounded by LLL pneumonia and hypoxia.    Ammonia normal.  pCO2 normal.  Doubt this is hypertensive.  - Hold psychotropics, baclofen, gabapentin - Consult Psychiatry, is this related to [place]?  - Treat pneumonia         Left lower lobe pneumonia Now with some low pO2 61, CXR with possible LLL infiltrate, hypothermia and WBC 12.   Sepsis ruled out. 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  Schizoaffective disorder, depressive type ([place])- (present on admission) See above Recently got [place] - Consult Psycahitry  Hypertensive urgency See above  Left arm swelling Recently evaluated by MRI brain which was unremarkable, left upper extremity ultrasound which was negative for DVT.  Has follow-up with Dr. Melrose Nakayama who evaluated her and has referred her to NCS/EMG to evaluate her peripheral nerve palsy and also vascular surgery to evaluate the swelling.  Of note, some notes indicate this was initiated after COVID booster  I discussed with vascular surgery.  They recommended CT venogram on a nonurgent basis.  Given her renal function I think it would be prudent to defer this until after the patient is well-established with nephrology - Follow-up with nephrology - She has an outpatient referral to vascular surgery already pending, she should keep this - Outpatient Neuro follow up and keep appointment for EMG/NCS  Anemia in chronic kidney disease (CKD) Hgb stable.  Sinus bradycardia Resolved with holding baclofen, gabapentin  CKD (chronic kidney disease), stage IV ([place])- (present on admission) Cr prior to admission 1.2-1.5 last year, during this admission, has been 1.7-2.2, probably due to progression of CKD due to untreated HTN.   - Hold losartan - Continue amlodipine, add HCTZ, hydralazine - IV fluids and trend Cr  Essential hypertension- (present on admission) BP labile, mostly elevated -Continue amlodipine, hydralazine, increase doses -Hold losartan for now - Start HCTZ  Controlled type 2 diabetes mellitus without complication (HCC) Glucose  normal - Hold home sitagliptin - Continue SS corrections        Subjective: Patient complains of pain and swelling in her left arm.  She has no headache, chest pain, cough, sputum, fever, confusion she is overall  bradykinetic  Physical Exam: Vitals:   09/28/21 1030 09/28/21 1031 09/28/21 1437 09/28/21 1529  BP: (!) 210/74 (!) 200/70 (!) 150/58 (!) 149/58  Pulse:   78 85  Resp:   16 18  Temp:   99.4 F (37.4 C) 99.6 F (37.6 C)  TempSrc:   Oral   SpO2:   99% 97%   Adult female, sitting up in bed, appears bradykinetic, flat affect, her left arm has a wrist drop and has some mild swelling. RRR, no murmurs, no peripheral edema other than left arm mild swelling, no JVD Normal respiratory rate and rhythm, no wheezing.  She has crackles in the left base. Abdomen soft no tenderness palpation or guarding, no ascites or distention Attention seems slightly slowed, psychomotor slowing is mild, she has a flat affect, the left wrist has a wrist drop, she keeps his arm contractured, the right arm is generally weak and she is guarding and, face is the same mild left-sided facial droop, speech fluent.  Data Reviewed: Labs and imaging are notable for creatinine stable at 2.2 without change, complete blood count with white blood cells down to 11.5, hemoglobin stable at 9.6  Family Communication:   Disposition: Status is: Observation The patient will require care spanning > 2 midnights and should be moved to inpatient because: The patient has an acute metabolic encephalopathy that makes it still impossible for her to stand safely without assistance.  I expect very be more stable tomorrow but able to transition down to geropsychiatric ongoing treatment of her schizophrenia        Planned Discharge Destination:  Geropsych         Author: Edwin Dada, MD 09/28/2021 5:12 PM  For on call review [email or web address].

## 2021-09-28 NOTE — Progress Notes (Signed)
°   09/28/21 0921  Assess: MEWS Score  Temp 98.4 F (36.9 C)  BP (!) 205/68 (RN Debi notified)  Pulse Rate 64  Resp 16  SpO2 98 %  O2 Device Room Air  Assess: MEWS Score  MEWS Temp 0  MEWS Systolic 2  MEWS Pulse 0  MEWS RR 0  MEWS LOC 0  MEWS Score 2  MEWS Score Color Yellow  Assess: if the MEWS score is Yellow or Red  Were vital signs taken at a resting state? Yes  Focused Assessment No change from prior assessment  Does the patient meet 2 or more of the SIRS criteria? No  MEWS guidelines implemented *See Row Information* Yes  Treat  MEWS Interventions Escalated (See documentation below)  Take Vital Signs  Increase Vital Sign Frequency  Yellow: Q 2hr X 2 then Q 4hr X 2, if remains yellow, continue Q 4hrs  Notify: Charge Nurse/RN  Name of Charge Nurse/RN Notified ashley/debi  Date Charge Nurse/RN Notified 09/28/21  Time Charge Nurse/RN Notified [id number]  Notify: Provider  Provider Name/Title [place]  Date Provider Notified 09/28/21  Time Provider Notified [phone]  Notification Type  (message)  Notification Reason Other (Comment) (patient has been having problems with BP before she was [place] here from ICU)  Provider response See new orders  Date of Provider Response 09/28/21  Time of Provider Response [id number]  Document  Patient Outcome Other (Comment) (same)  Progress note created (see row info) Yes  Assess: SIRS CRITERIA  SIRS Temperature  0  SIRS Pulse 0  SIRS Respirations  0  SIRS WBC 0  SIRS Score Sum  0

## 2021-09-28 NOTE — Progress Notes (Signed)
S: Increased tone on exam is significantly improved and so is her mental status.   O:  Vitals:   09/28/21 1437 09/28/21 1529  BP: (!) 150/58 (!) 149/58  Pulse: 78 85  Resp: 16 18  Temp: 99.4 F (37.4 C) 99.6 F (37.6 C)  SpO2: 99% 97%   Physical Exam  Constitutional: Appears well-developed and well-nourished.  Psych: Affect appropriate to situation Eyes: No scleral injection HENT: No OP obstruction MSK: no joint deformities.  Cardiovascular: Normal rate and regular rhythm.  Respiratory: Effort normal, non-labored breathing GI: Soft.  No distension. There is no tenderness.  Skin: WDI   Neuro: MS: alert, oriented to self, hospital, year, month, and age CN: PERRL, VFF, EOMI, sensation intact, mild L facial weakness corrects with smile (chronic), hearing intact to voice Motor: able to hold BUE against gravity, increased tone significantly improved, known R radial nerve palsy. No waxy flexibility on my examination today. Wiggles toes bilat. Sensory: SILT Coordination: will not perform DTRs: 1+ symmetric throughout Gait: deferred  A/P: 61 year old female with a history of schizophrenia with a decreased mental status in the setting of being started on Ativan for catatonia as well as taking gabapentin in setting of [name]. CK not elevated - not c/w NMS. Her increased tone and mental status are both improving with treatment of her [name].    Recommendations: 1) No further inpatient neurologic workup indicated at this time 2) limit sedating medications 3) psychiatric medications per psychiatry. 4) management of [name] per internal medicine  Neurology to sign off, but please re-engage if additional neurologic concerns arise.  Su Monks, MD [facility] [phone]  If 7pm- 7am, please page neurology on call as listed in [place].

## 2021-09-28 NOTE — Assessment & Plan Note (Signed)
See above

## 2021-09-29 ENCOUNTER — Other Ambulatory Visit: Payer: Self-pay

## 2021-09-29 ENCOUNTER — Inpatient Hospital Stay
Admission: AD | Admit: 2021-09-29 | Discharge: 2021-11-10 | DRG: 885 | Disposition: A | Discharge status: Home / Self Care | Payer: Medicare Other | Source: Intra-hospital | Attending: Psychiatry | Admitting: Psychiatry

## 2021-09-29 ENCOUNTER — Observation Stay: Payer: Medicare Other

## 2021-09-29 ENCOUNTER — Encounter: Payer: Self-pay | Admitting: Psychiatry

## 2021-09-29 DIAGNOSIS — E1122 Type 2 diabetes mellitus with diabetic chronic kidney disease: Secondary | ICD-10-CM | POA: Diagnosis present

## 2021-09-29 DIAGNOSIS — Z79899 Other long term (current) drug therapy: Secondary | ICD-10-CM | POA: Diagnosis not present

## 2021-09-29 DIAGNOSIS — Z20822 Contact with and (suspected) exposure to covid-19: Secondary | ICD-10-CM | POA: Diagnosis present

## 2021-09-29 DIAGNOSIS — F17210 Nicotine dependence, cigarettes, uncomplicated: Secondary | ICD-10-CM | POA: Diagnosis present

## 2021-09-29 DIAGNOSIS — T43506A Underdosing of unspecified antipsychotics and neuroleptics, initial encounter: Secondary | ICD-10-CM | POA: Diagnosis present

## 2021-09-29 DIAGNOSIS — G8929 Other chronic pain: Secondary | ICD-10-CM | POA: Diagnosis present

## 2021-09-29 DIAGNOSIS — E860 Dehydration: Secondary | ICD-10-CM | POA: Diagnosis not present

## 2021-09-29 DIAGNOSIS — Z91128 Patient's intentional underdosing of medication regimen for other reason: Secondary | ICD-10-CM

## 2021-09-29 DIAGNOSIS — Z880 Allergy status to penicillin: Secondary | ICD-10-CM

## 2021-09-29 DIAGNOSIS — I120 Hypertensive chronic kidney disease with stage 5 chronic kidney disease or end stage renal disease: Secondary | ICD-10-CM | POA: Diagnosis present

## 2021-09-29 DIAGNOSIS — N186 End stage renal disease: Secondary | ICD-10-CM | POA: Diagnosis present

## 2021-09-29 DIAGNOSIS — G9341 Metabolic encephalopathy: Secondary | ICD-10-CM | POA: Diagnosis not present

## 2021-09-29 DIAGNOSIS — G5632 Lesion of radial nerve, left upper limb: Secondary | ICD-10-CM | POA: Diagnosis present

## 2021-09-29 DIAGNOSIS — Z833 Family history of diabetes mellitus: Secondary | ICD-10-CM | POA: Diagnosis not present

## 2021-09-29 DIAGNOSIS — Z8249 Family history of ischemic heart disease and other diseases of the circulatory system: Secondary | ICD-10-CM | POA: Diagnosis not present

## 2021-09-29 DIAGNOSIS — N184 Chronic kidney disease, stage 4 (severe): Secondary | ICD-10-CM | POA: Diagnosis not present

## 2021-09-29 DIAGNOSIS — F250 Schizoaffective disorder, bipolar type: Principal | ICD-10-CM | POA: Diagnosis present

## 2021-09-29 DIAGNOSIS — K5900 Constipation, unspecified: Secondary | ICD-10-CM | POA: Diagnosis not present

## 2021-09-29 DIAGNOSIS — F061 Catatonic disorder due to known physiological condition: Secondary | ICD-10-CM | POA: Diagnosis not present

## 2021-09-29 DIAGNOSIS — Z9181 History of falling: Secondary | ICD-10-CM | POA: Diagnosis not present

## 2021-09-29 DIAGNOSIS — Z818 Family history of other mental and behavioral disorders: Secondary | ICD-10-CM | POA: Diagnosis not present

## 2021-09-29 DIAGNOSIS — I160 Hypertensive urgency: Secondary | ICD-10-CM

## 2021-09-29 DIAGNOSIS — Z91148 Patient's other noncompliance with medication regimen for other reason: Secondary | ICD-10-CM | POA: Diagnosis not present

## 2021-09-29 DIAGNOSIS — E119 Type 2 diabetes mellitus without complications: Secondary | ICD-10-CM | POA: Diagnosis not present

## 2021-09-29 LAB — BASIC METABOLIC PANEL
Anion gap: 8 (ref 5–15)
BUN: 43 mg/dL — ABNORMAL HIGH (ref 8–23)
CO2: 26 mmol/L (ref 22–32)
Calcium: 8.9 mg/dL (ref 8.9–10.3)
Chloride: 107 mmol/L (ref 98–111)
Creatinine, Ser: 2.18 mg/dL — ABNORMAL HIGH (ref 0.44–1.00)
GFR, Estimated: 25 mL/min — ABNORMAL LOW (ref >60)
Glucose, Bld: 221 mg/dL — ABNORMAL HIGH (ref 70–99)
Potassium: 4.5 mmol/L (ref 3.5–5.1)
Sodium: 141 mmol/L (ref 135–145)

## 2021-09-29 LAB — GLUCOSE, CAPILLARY
Glucose-Capillary: 158 mg/dL — ABNORMAL HIGH (ref 70–99)
Glucose-Capillary: 211 mg/dL — ABNORMAL HIGH (ref 70–99)
Glucose-Capillary: 138 mg/dL — ABNORMAL HIGH (ref 70–99)
Glucose-Capillary: 344 mg/dL — ABNORMAL HIGH (ref 70–99)

## 2021-09-29 MED ORDER — AMLODIPINE BESYLATE 10 MG PO TABS: 10.0000 mg | ORAL_TABLET | Freq: Every day | ORAL | Status: DC

## 2021-09-29 MED ORDER — ALUM & MAG HYDROXIDE-SIMETH 200-200-20 MG/5ML PO SUSP
30.0000 mL | ORAL | Status: DC | PRN
  Administered 2021-10-01: 30 mL via ORAL
  Filled 2021-09-29: qty 30

## 2021-09-29 MED ORDER — CEFDINIR 300 MG PO CAPS
300.0000 mg | ORAL_CAPSULE | Freq: Every day | ORAL | Status: AC
  Administered 2021-09-30 – 2021-10-01 (×2): 300 mg via ORAL
  Filled 2021-09-29 (×2): qty 1

## 2021-09-29 MED ORDER — ONDANSETRON HCL 4 MG/2ML IJ SOLN: 4.0000 mg | Freq: Four times a day (QID) | INTRAMUSCULAR | Status: DC | PRN

## 2021-09-29 MED ORDER — HYDRALAZINE HCL 50 MG PO TABS: 50.0000 mg | ORAL_TABLET | Freq: Three times a day (TID) | ORAL | Status: DC

## 2021-09-29 MED ORDER — INSULIN ASPART 100 UNIT/ML IJ SOLN
0.0000 [IU] | Freq: Three times a day (TID) | INTRAMUSCULAR | Status: DC
  Administered 2021-09-29: 2 [IU] via SUBCUTANEOUS
  Administered 2021-09-30: 8 [IU] via SUBCUTANEOUS
  Administered 2021-09-30: 3 [IU] via SUBCUTANEOUS
  Administered 2021-09-30 – 2021-10-01 (×2): 8 [IU] via SUBCUTANEOUS
  Administered 2021-10-01 (×2): 5 [IU] via SUBCUTANEOUS
  Administered 2021-10-02: 3 [IU] via SUBCUTANEOUS
  Administered 2021-10-02: 5 [IU] via SUBCUTANEOUS
  Administered 2021-10-02 – 2021-10-03 (×3): 3 [IU] via SUBCUTANEOUS
  Administered 2021-10-03: 11 [IU] via SUBCUTANEOUS
  Administered 2021-10-04: 3 [IU] via SUBCUTANEOUS
  Administered 2021-10-04: 5 [IU] via SUBCUTANEOUS
  Administered 2021-10-04 – 2021-10-05 (×2): 3 [IU] via SUBCUTANEOUS
  Administered 2021-10-05: 5 [IU] via SUBCUTANEOUS
  Administered 2021-10-05 – 2021-10-06 (×2): 3 [IU] via SUBCUTANEOUS
  Administered 2021-10-06: 2 [IU] via SUBCUTANEOUS
  Administered 2021-10-06: 3 [IU] via SUBCUTANEOUS
  Administered 2021-10-07: 5 [IU] via SUBCUTANEOUS
  Administered 2021-10-07: 2 [IU] via SUBCUTANEOUS
  Administered 2021-10-07 – 2021-10-08 (×2): 3 [IU] via SUBCUTANEOUS
  Administered 2021-10-08: 12:00:00 8 [IU] via SUBCUTANEOUS
  Administered 2021-10-09: 2 [IU] via SUBCUTANEOUS
  Administered 2021-10-09 (×2): 3 [IU] via SUBCUTANEOUS
  Administered 2021-10-10: 5 [IU] via SUBCUTANEOUS
  Administered 2021-10-10: 3 [IU] via SUBCUTANEOUS
  Administered 2021-10-10 – 2021-10-11 (×3): 2 [IU] via SUBCUTANEOUS
  Administered 2021-10-11: 5 [IU] via SUBCUTANEOUS
  Administered 2021-10-12: 3 [IU] via SUBCUTANEOUS
  Administered 2021-10-12: 8 [IU] via SUBCUTANEOUS
  Administered 2021-10-13: 2 [IU] via SUBCUTANEOUS
  Administered 2021-10-13: 5 [IU] via SUBCUTANEOUS
  Administered 2021-10-13: 2 [IU] via SUBCUTANEOUS
  Administered 2021-10-14: 5 [IU] via SUBCUTANEOUS
  Administered 2021-10-14 – 2021-10-15 (×2): 3 [IU] via SUBCUTANEOUS
  Administered 2021-10-15: 8 [IU] via SUBCUTANEOUS
  Administered 2021-10-16: 3 [IU] via SUBCUTANEOUS
  Filled 2021-09-29 (×46): qty 1

## 2021-09-29 MED ORDER — INSULIN ASPART 100 UNIT/ML IJ SOLN
0.0000 [IU] | Freq: Every day | INTRAMUSCULAR | Status: DC
  Administered 2021-09-29: 4 [IU] via SUBCUTANEOUS
  Administered 2021-09-30 – 2021-10-01 (×2): 2 [IU] via SUBCUTANEOUS
  Administered 2021-10-02: 3 [IU] via SUBCUTANEOUS
  Administered 2021-10-08 – 2021-10-13 (×3): 2 [IU] via SUBCUTANEOUS
  Filled 2021-09-29 (×7): qty 1

## 2021-09-29 MED ORDER — HYDROCHLOROTHIAZIDE 12.5 MG PO TABS
12.5000 mg | ORAL_TABLET | Freq: Every day | ORAL | Status: DC
  Administered 2021-09-30 – 2021-10-04 (×5): 12.5 mg via ORAL
  Filled 2021-09-29 (×5): qty 1

## 2021-09-29 MED ORDER — PALIPERIDONE ER 3 MG PO TB24: 6.0000 mg | ORAL_TABLET | Freq: Every day | ORAL | Status: DC

## 2021-09-29 MED ORDER — HYDROCHLOROTHIAZIDE 12.5 MG PO TABS: 12.5000 mg | ORAL_TABLET | Freq: Every day | ORAL | Status: DC

## 2021-09-29 MED ORDER — AMLODIPINE BESYLATE 5 MG PO TABS
10.0000 mg | ORAL_TABLET | Freq: Every day | ORAL | Status: DC
  Administered 2021-09-30 – 2021-10-23 (×24): 10 mg via ORAL
  Filled 2021-09-29 (×24): qty 2

## 2021-09-29 MED ORDER — POLYETHYLENE GLYCOL 3350 17 G PO PACK
17.0000 g | PACK | Freq: Every day | ORAL | Status: DC | PRN
  Administered 2021-10-18: 17 g via ORAL
  Filled 2021-09-29 (×2): qty 1

## 2021-09-29 MED ORDER — ONDANSETRON HCL 4 MG PO TABS: 4.0000 mg | ORAL_TABLET | Freq: Four times a day (QID) | ORAL | Status: DC | PRN

## 2021-09-29 MED ORDER — HYDRALAZINE HCL 25 MG PO TABS
50.0000 mg | ORAL_TABLET | Freq: Three times a day (TID) | ORAL | Status: DC
  Administered 2021-09-29 – 2021-11-10 (×120): 50 mg via ORAL
  Filled 2021-09-29 (×124): qty 2

## 2021-09-29 MED ORDER — CEFDINIR 300 MG PO CAPS: 300.0000 mg | ORAL_CAPSULE | Freq: Every day | ORAL | 0 refills | Status: DC

## 2021-09-29 MED ORDER — TRAZODONE HCL 50 MG PO TABS: 100.0000 mg | ORAL_TABLET | Freq: Every day | ORAL | Status: DC

## 2021-09-29 MED ORDER — ESCITALOPRAM OXALATE 10 MG PO TABS: 10.0000 mg | ORAL_TABLET | Freq: Every day | ORAL | Status: DC

## 2021-09-29 NOTE — Care Management (Signed)
°  Transition of Care [facility]) Screening Note   Patient Details  Name: Alicia Rogers Date of Birth: 1960-11-24   Transition of Care [facility]) CM/SW Contact:    Pete Pelt, RN Phone Number: 09/29/2021, 4:16 PM    Transition of Care Department [facility]) has reviewed patient and no TOC needs have been identified at this time. We will continue to monitor patient advancement through interdisciplinary progression rounds. If new patient transition needs arise, please place a TOC consult.

## 2021-09-29 NOTE — Tx Team (Signed)
Initial Treatment Plan 09/29/2021 6:00 PM [name] [facility]:[id number]    PATIENT STRESSORS: Health problems     PATIENT STRENGTHS: Religious Affiliation    PATIENT IDENTIFIED PROBLEMS: States she doesn't know why she is here.                     DISCHARGE CRITERIA:  Safe-care adequate arrangements made  PRELIMINARY DISCHARGE PLAN: Return to previous living arrangement  PATIENT/FAMILY INVOLVEMENT: This treatment plan has been presented to and reviewed with the patient, Alicia Rogers. The patient and family have been given the opportunity to ask questions and make suggestions.  Nolon Bussing, RN 09/29/2021, 6:00 PM

## 2021-09-29 NOTE — Progress Notes (Signed)
Patient being discharged. PIV removed. RN and MD went over discharge instructions with patient. Patient transferred to [facility] accompanied by RN and security. Called report to [place], Therapist, sports in [place].

## 2021-09-29 NOTE — Plan of Care (Signed)
°  Problem: Education: Goal: Knowledge of [facility] Education information/materials will improve Outcome: Not Progressing Goal: Emotional status will improve Outcome: Not Progressing Goal: Mental status will improve Outcome: Not Progressing Goal: Verbalization of understanding the information provided will improve Outcome: Not Progressing

## 2021-09-29 NOTE — Progress Notes (Signed)
Patient admitted IVC to [facility] from 1(c) with diagnosis of acute metabolic encephalopathy. Patient presents to unit via [place] accompanied by security and a nurse. She is A&O Xs 3. Patient states, "I that she really doesn't know why she is here." Patient's affect is flat, speech is slow, and thoughts are organized. Patient denies depression and anxiety. Currently denies suicidal ideations, homicidal ideations, audio or visual hallucinations and verbally contracts for safety on unit. She denies pain at time of admission- swelling is noted to left hand and wrist. Patient denies drug abuse or drinking, but does smoke cigarettes. Declines nicotine patch. Patient reports living with her family. 2 rough patches noted under patients right breast and to her right hip. Blood sugar 138 on arrival- coverage provided.   Emotional support and reassurance provided throughout admission intake. Afterwards, oriented patient to unit, room and call light, reviewed POC with all questions answered and understanding verbalzied. Placed pt on high risk fall precautions per policy and provided unit's rolling walker for ambulation. Denies any needs at this time. Will continue to monitor with ongoing Q 15 minute safety checks per unit protocol.

## 2021-09-29 NOTE — Discharge Summary (Signed)
Physician Discharge Summary   Patient: Alicia Rogers MRN: [id number] DOB: 10-27-1960  Admit date:     09/27/2021  Discharge date: 09/29/21  Discharge Physician: Edwin Dada   PCP: Marygrace Drought, MD   Recommendations at discharge from behavioral health:  Dr. Louis Meckel: Please send patient new prescriptions for her new blood pressure regimen Dr. Louis Meckel: See below about antihypertensive titration Patient should follow up with Dr. Candiss Norse, Nephrology, for new CKD IV, hypertension, and also suitability for IV iodine contrast, if needed for evaluation of her left arm Patient should follow up with Dr. Melrose Nakayama for her left wrist neuropathy Patient should follow up with Vascular Surgery for left arm swelling after evaluation by Nephrology  Avoid gabapentin and baclofen given her renal function        Discharge Diagnoses: Principal Problem:   Acute metabolic encephalopathy Active Problems:   Left lower lobe pneumonia   Schizoaffective disorder, depressive type ([place])   Catatonia   Controlled type 2 diabetes mellitus without complication ([place])   Essential hypertension   CKD (chronic kidney disease), stage IV (HCC)   Sinus bradycardia   Anemia in chronic kidney disease (CKD)   Left arm swelling   Hypertensive urgency       Hospital Course: 61 yo F with HTN, DM, CKD IIIa baseline ~2, depression and schizophrenia admitted to [facility] 4 days prior to this medical admission due to decompensated schizophrenia, paranoia and noncompliance.    In the ER, EDP unclear who IVC'd the patient.  Patient admitted to noncompliance.  Complained of left arm and hand pain, chronic.  Endorsed some auditory hallucinations.  Was in treatment in [place] for several days, withdrawn per notes, but interactive.  Dose of gabapentin was increased.  On day prior to admission, she seemed more sluggish to nursing, not taking much PO, and had very flat, unemotional affect, with slow arm movements  and some odd arm posturing.  Cr had been ~2, which may be her baseline.  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  Overnight, she fell around 3AM, had some ataxic gait per RN notes.   CTH obtained, normal.  LKN 5AM.  Around 7AM, patient was nonresponsive to sternal rub, appeared flaccid in all 4, nursing felt her left face was drooping.  CODE STROKE called.         Assessment and Plan: * Acute metabolic encephalopathy- (present on admission) Suspect this was from gabapentin, ativan and baclofen in the setting of her worsening renal function.    These were held and her mentation returned to normal    Avoid baclofen and gabapentin at discharge.     Left lower lobe pneumonia On transfer to med surg floor, noted to have pO2 61, CXR with possible LLL infiltrate, hypothermia and WBC 12.   Sepsis ruled out.  Continue cefdinir 300 once daily for 3 more days.        Hypertensive urgency She was started on amlodipine 10, hydralazine 50 TID and HCTZ.  If BP does not improve to <073 systolic over next 5 days, would add first carvedilol 12.5 BID, then maximize HCTZ and carvedilol, next     CKD (chronic kidney disease), stage IV ([place])- (present on admission) Cr prior to admission 1.2-1.5 last year, during this admission, has been 1.7-2.2, probably due to progression of CKD due to untreated HTN.     Left arm swelling Recently evaluated by MRI brain which was unremarkable, left upper extremity ultrasound which was negative for DVT.  Has follow-up with Dr. Melrose Nakayama who evaluated her and has referred her to NCS/EMG to evaluate her peripheral nerve palsy and also vascular surgery to evaluate the swelling.  Of note, some notes indicate this was initiated after COVID booster  I discussed with vascular surgery.  They recommended CT venogram on a nonurgent basis.  Given her renal function I think it would be prudent to defer this until after the patient is  well-established with nephrology - Follow-up with nephrology - She has an outpatient referral to vascular surgery already pending, she should keep this - Outpatient Neuro follow up and keep appointment for EMG/NCS   Sinus bradycardia Resolved with holding baclofen, gabapentin            Pain control - [facility] Controlled Substance Reporting System database was reviewed.    Consultants: Neurology Procedures performed: MRI brain  Disposition:  [facility] Diet recommendation: Low sodium   DISCHARGE MEDICATION: Allergies as of 09/29/2021       Reactions   Penicillins    Rash        Medication List     STOP taking these medications    lurasidone 40 MG Tabs tablet Commonly known as: Latuda   sitaGLIPtin 100 MG tablet Commonly known as: JANUVIA       TAKE these medications    amLODipine 10 MG tablet Commonly known as: NORVASC Take 1 tablet (10 mg total) by mouth daily. Start taking on: September 30, 2021 What changed:  medication strength how much to take when to take this   cefdinir 300 MG capsule Commonly known as: OMNICEF Take 1 capsule (300 mg total) by mouth daily for 3 days. Start taking on: September 30, 2021   hydrALAZINE 50 MG tablet Commonly known as: APRESOLINE Take 1 tablet (50 mg total) by mouth 3 (three) times daily.   hydrochlorothiazide 12.5 MG tablet Commonly known as: HYDRODIURIL Take 1 tablet (12.5 mg total) by mouth daily. Start taking on: September 30, 2021        Follow-up Information     Anabel Bene, MD Follow up.   Specialty: Neurology Contact information: [facility] [place] [address] [phone]         [place] Follow up.   Why: For the arm swelling, call Dr. Lannie Fields office to confirm where you were sent a referral Contact information: [address] [place] [phone]        Murlean Iba, MD. Schedule  an appointment as soon as possible for a visit in 1 month(s).   Specialty: Nephrology Why: For kidney follow up Contact information: [place]  [address] [phone]                Discharge Instructions     Discharge instructions   Complete by: As directed    You were treated here in the medical ward for excess gabapentin and baclofen  We also found you may have a pneumonia  Complete treatment for the pneumonia with three more days cefdinir 300 mg once daily the antibiotic.  For your kidneys: Go see a nephrologist, kidney specialist The doctor below, Dr. Candiss Norse, is one we recommend  Also, important, take your blood pressure medicines daily  Lastly, for your arm, keep all appointments with Dr. Melrose Nakayama Make sure you go to the nerve conduction study, EMG appointment  After you have seen the nephrologist, go see the vascular surgeons about your swelling  Discharge Exam: BP (!) 162/59 (BP Location: Left Wrist)    Pulse 73    Temp 98 F (36.7 C)    Resp 15    SpO2 97%   General: Pt is alert, awake, not in acute distress, she has some blunting of her affect Cardiovascular: RRR, nl S1-S2, no murmurs appreciated.   No LE edema.   Respiratory: Normal respiratory rate and rhythm.  CTAB without rales or wheezes. Abdominal: Abdomen soft and non-tender.  No distension or HSM.   Neuro/Psych: The left arm has a wrist drop, she guards it.  Her judgment and insight appear to be mildly impaired.  Patient with some mild left facial droop.      Condition at discharge: fair  The results of significant diagnostics from this hospitalization (including imaging, microbiology, ancillary and laboratory) are listed below for reference.   Imaging Studies: CT HEAD WO CONTRAST (5MM)  Result Date: 09/27/2021 CLINICAL DATA:  61 year old female with history of ataxia. Unwitnessed fall. EXAM: CT HEAD WITHOUT CONTRAST TECHNIQUE: Contiguous axial images were  obtained from the base of the skull through the vertex without intravenous contrast. RADIATION DOSE REDUCTION: This exam was performed according to the departmental dose-optimization program which includes automated exposure control, adjustment of the mA and/or kV according to patient size and/or use of iterative reconstruction technique. COMPARISON:  Head CT 06/08/2021. FINDINGS: Brain: No evidence of acute infarction, hemorrhage, hydrocephalus, extra-axial collection or mass lesion/mass effect. Vascular: No hyperdense vessel or unexpected calcification. Skull: Normal. Negative for fracture or focal lesion. Sinuses/Orbits: No acute finding. Other: None. IMPRESSION: 1. No acute intracranial abnormalities. The appearance of the brain is normal. Electronically Signed   By: Vinnie Langton M.D.   On: 09/27/2021 05:10   US Venous Img Upper Uni Left  Result Date: 09/04/2021 CLINICAL DATA:  Pain and swelling of the left upper extremity EXAM: Left UPPER EXTREMITY VENOUS DOPPLER ULTRASOUND TECHNIQUE: Gray-scale sonography with graded compression, as well as color Doppler and duplex ultrasound were performed to evaluate the upper extremity deep venous system from the level of the subclavian vein and including the jugular, axillary, basilic, radial, ulnar and upper cephalic vein. Spectral Doppler was utilized to evaluate flow at rest and with distal augmentation maneuvers. COMPARISON:  08/14/2021 FINDINGS: Study was difficult because of problems with patient positioning. Contralateral Subclavian Vein: Respiratory phasicity is normal and symmetric with the symptomatic side. No evidence of thrombus. Normal compressibility. Internal Jugular Vein: No evidence of thrombus. Normal compressibility, respiratory phasicity and response to augmentation. Subclavian Vein: No evidence of thrombus. Normal compressibility, respiratory phasicity and response to augmentation. Axillary Vein: Not well visualized. Cephalic Vein: No evidence  of thrombus. Normal compressibility, respiratory phasicity and response to augmentation. Basilic Vein: No evidence of thrombus. Normal compressibility, respiratory phasicity and response to augmentation. Brachial Veins: No evidence of thrombus. Normal compressibility, respiratory phasicity and response to augmentation. Radial Veins: No evidence of thrombus. Normal compressibility, respiratory phasicity and response to augmentation. Ulnar Veins: Not visualized. Venous Reflux:  None visualized. Other Findings:  None visualized. IMPRESSION: No evidence of DVT within the left upper extremity. Technically difficult exam. Poor visualization of the axillary vein and ulnar vein, but otherwise normal, therefore unlikely that there would be localized thrombosis in those regions. Electronically Signed   By: Nelson Chimes M.D.   On: 09/04/2021 20:28   US ARTERIAL ABI (SCREENING LOWER EXTREMITY)  Result Date: 09/29/2021 CLINICAL DATA:  Essential hypertension. EXAM: NONINVASIVE PHYSIOLOGIC VASCULAR STUDY OF BILATERAL LOWER EXTREMITIES TECHNIQUE: Evaluation of both  lower extremities were performed at rest, including calculation of ankle-brachial indices with single level Doppler, pressure and pulse volume recording. COMPARISON:  None. FINDINGS: Right ABI:  0.66 Left ABI:  0.77 Right Lower Extremity:  Monophasic waveforms in the right ankle. Left Lower Extremity:  Monophasic waveforms in the left ankle. 0.5-0.79 Moderate PAD IMPRESSION: 1. Right ABI is 0.66 and compatible with moderate peripheral arterial disease. 2. Left ABI is 0.77 and compatible with moderate peripheral arterial disease. Electronically Signed   By: Markus Daft M.D.   On: 09/29/2021 09:25   DG Chest Port 1 View  Result Date: 09/27/2021 CLINICAL DATA:  Hypoxia EXAM: PORTABLE CHEST 1 VIEW COMPARISON:  Previous studies including the examination of 03/21/2021 FINDINGS: Transverse diameter of heart is increased. Central pulmonary vessels are prominent. There are  no signs of alveolar pulmonary edema or focal pulmonary consolidation. There is blunting of left lateral CP angle. There is no pneumothorax. IMPRESSION: Cardiomegaly. There are no signs of alveolar pulmonary edema or focal pulmonary consolidation. Blunting of left lateral CP angle may be due to pleural thickening or small effusion. Electronically Signed   By: Elmer Picker M.D.   On: 09/27/2021 09:41   CT HEAD CODE STROKE WO CONTRAST  Result Date: 09/27/2021 CLINICAL DATA:  Code stroke.  Facial droop EXAM: CT HEAD WITHOUT CONTRAST TECHNIQUE: Contiguous axial images were obtained from the base of the skull through the vertex without intravenous contrast. RADIATION DOSE REDUCTION: This exam was performed according to the departmental dose-optimization program which includes automated exposure control, adjustment of the mA and/or kV according to patient size and/or use of iterative reconstruction technique. COMPARISON:  Head CT from earlier today FINDINGS: Brain: No evidence of acute infarction, hemorrhage, hydrocephalus, extra-axial collection or mass lesion/mass effect. Mild patchy low-density in the cerebral white matter. Vascular: No hyperdense vessel or unexpected calcification. Skull: Normal. Negative for fracture or focal lesion. Sinuses/Orbits: No acute finding. Other: These results were communicated to Dr. Leonel Ramsay at 8:34 am on 09/27/2021 by text page via the [facility] messaging system. ASPECTS [facility] Stroke Program Early CT Score) - Ganglionic level infarction (caudate, lentiform nuclei, internal capsule, insula, M1-M3 cortex): 7 - Supraganglionic infarction (M4-M6 cortex): 3 Total score (0-10 with 10 being normal): 10 IMPRESSION: 1. No acute finding. 2. Mild chronic small vessel disease Electronically Signed   By: Jorje Guild M.D.   On: 09/27/2021 08:34    Microbiology: Results for orders placed or performed during the hospital encounter of 09/27/21  CULTURE, BLOOD (ROUTINE X 2) w Reflex  to ID Panel     Status: None (Preliminary result)   Collection Time: 09/27/21  4:07 PM   Specimen: BLOOD  Result Value Ref Range Status   Specimen Description BLOOD BLOOD RIGHT HAND  Final   Special Requests   Final    BOTTLES DRAWN AEROBIC AND ANAEROBIC Blood Culture results may not be optimal due to an excessive volume of blood received in culture bottles   Culture   Final    NO GROWTH < 24 HOURS Performed at [facility], [address] [place], [place] [address]    Report Status PENDING  Incomplete  CULTURE, BLOOD (ROUTINE X 2) w Reflex to ID Panel     Status: None (Preliminary result)   Collection Time: 09/27/21  4:19 PM   Specimen: BLOOD  Result Value Ref Range Status   Specimen Description BLOOD BLOOD LEFT FOREARM  Final   Special Requests   Final    BOTTLES DRAWN AEROBIC  AND ANAEROBIC Blood Culture adequate volume   Culture   Final    NO GROWTH < 24 HOURS Performed at [facility], [address] [place], [place] [address]    Report Status PENDING  Incomplete    Labs: CBC: Recent Labs  Lab 09/27/21 0849 09/28/21 0457  WBC 12.4* 11.5*  NEUTROABS 8.4*  --   HGB 8.7* 9.6*  HCT 28.4* 30.7*  MCV 89.3 88.7  PLT 384 552*   Basic Metabolic Panel: Recent Labs  Lab 09/25/21 0629 09/27/21 0849 09/28/21 0457 09/29/21 0508  NA 141 138 140 141  K 4.4 4.6 4.9 4.5  CL 109 102 103 107  CO2 25 23 26 26   GLUCOSE 184* 220* 296* 221*  BUN 47* 45* 43* 43*  CREATININE 2.27* 2.17* 2.20* 2.18*  CALCIUM 8.5* 9.1 8.6* 8.9  MG  --  2.2  --   --    Liver Function Tests: Recent Labs  Lab 09/25/21 0629 09/27/21 0849  AST 18 18  ALT 16 17  ALKPHOS 80 97  BILITOT 0.2* 0.5  PROT 6.2* 6.4*  ALBUMIN 2.3* 2.5*   CBG: Recent Labs  Lab 09/28/21 1142 09/28/21 1613 09/28/21 2158 09/29/21 0734 09/29/21 1205  GLUCAP 256* 136* 153* 158* 211*    Discharge time spent: 40 minutes.  Signed: Edwin Dada, MD [facility] 09/29/2021

## 2021-09-30 DIAGNOSIS — F250 Schizoaffective disorder, bipolar type: Secondary | ICD-10-CM | POA: Diagnosis not present

## 2021-09-30 LAB — GLUCOSE, CAPILLARY
Glucose-Capillary: 200 mg/dL — ABNORMAL HIGH (ref 70–99)
Glucose-Capillary: 251 mg/dL — ABNORMAL HIGH (ref 70–99)
Glucose-Capillary: 285 mg/dL — ABNORMAL HIGH (ref 70–99)
Glucose-Capillary: 228 mg/dL — ABNORMAL HIGH (ref 70–99)

## 2021-09-30 MED ORDER — TRAZODONE HCL 100 MG PO TABS
100.0000 mg | ORAL_TABLET | Freq: Every day | ORAL | Status: DC
  Administered 2021-09-30 – 2021-11-09 (×41): 100 mg via ORAL
  Filled 2021-09-30 (×41): qty 1

## 2021-09-30 MED ORDER — PALIPERIDONE ER 3 MG PO TB24
6.0000 mg | ORAL_TABLET | Freq: Every day | ORAL | Status: DC
  Administered 2021-09-30 – 2021-10-08 (×9): 6 mg via ORAL
  Filled 2021-09-30 (×9): qty 2

## 2021-09-30 MED ORDER — ACETAMINOPHEN 325 MG PO TABS
650.0000 mg | ORAL_TABLET | Freq: Four times a day (QID) | ORAL | Status: DC | PRN
  Administered 2021-10-01 – 2021-11-09 (×9): 650 mg via ORAL
  Filled 2021-09-30 (×10): qty 2

## 2021-09-30 MED ORDER — ESCITALOPRAM OXALATE 10 MG PO TABS
10.0000 mg | ORAL_TABLET | Freq: Every day | ORAL | Status: DC
  Administered 2021-09-30 – 2021-11-10 (×42): 10 mg via ORAL
  Filled 2021-09-30 (×43): qty 1

## 2021-09-30 NOTE — Progress Notes (Signed)
PMHNP Geni Bers) notified of pt's c/o L hand pain and L palm itching. Upon inspection, LUE swollen with 1+ pitting edema of L hand. Order received for tylenol with instructions to clean L palm and use wash cloth to help open L hand. ?

## 2021-09-30 NOTE — BHH Counselor (Signed)
CSW spoke with pt's sister, [name] SUORVI,[phone]. ?  ?She stated that she believes pt is able to continue services with her nurse that was providing medications but in the past pt has been reluctant to take medications as prescribed. CSW was provided with nurses name during previous admission and will follow up with nurse regarding continued care.  ? ?She stated she was interested in a higher level of care because pt has a history of not taking her medications as prescribed. She stated that she does not feel that she or her brother are well enough to take care of pt currently. ? ?CSW stated that pt mentioned a nephew, Celesta Gentile, [phone], whom she can stay with and CSW will follow up nephew and find out if pt can stay with him. ? ? ?Conversation ended without incident. No other requests were made.  ? ?Marquet Faircloth [place], MSW, LCSW-A ?3/1/20232:52 PM   ? ? ?

## 2021-09-30 NOTE — Progress Notes (Signed)
Recreation Therapy Notes ? ?Date: 09/30/2021  ? ?Time: 1:25 pm   ? ?Location: Craft room   ? ?Behavioral response: Appropriate ? ?Intervention Topic:  Self-care   ? ?Discussion/Intervention:  ?Group content today was focused on Self-Care. The group defined self-care and some positive ways they care for themselves. Individuals expressed ways and reasons why they neglected any self-care in the past. Patients described ways to improve self-care in the future. The group explained what could happen if they did not do any self-care activities at all. The group participated in the intervention ?self-care assessment? where they had a chance to discover some of their weaknesses and strengths in self- care. Patient came up with a self-care plan to improve themselves in the future.  ? ?Clinical Observations/Feedback: ?Patient came to group and was focused on what peers and staff had to say about self-care. Individual was social with peers and staff while participating in the intervention.   ?Alicia Rogers LRT/CTRS  ? ? ? ? ? ? ? ?Alicia Rogers ?09/30/2021 3:14 PM ?

## 2021-09-30 NOTE — Progress Notes (Signed)
Patient is alert and oriented with a flat affect. Patient ambulating around the unit with steady gait. States that she feels better than before.Patient continues on ABT x3 days for left lower lobe pneumonia. Patient states that she slept ok. Denies SI,HI,AVH. Also denies depression and anxiety. CBG checks obtained, patient ate breakfast in the day room with fair appetite. Encourage to drink fluids. AM medications administered as ordered. Patient states that her goals are to get better. Emotional support provided. Resident denies any pain at this time. Q 15 minutes safety checks maintained. ?

## 2021-09-30 NOTE — Progress Notes (Signed)
Patient was visible in the milieu during the evening. She kept asking was she getting discharged, I informed her to follow up with the doctor this morning.  She did take her medication after constant encouragement. She seemed to rest well in bed through out the night. ?

## 2021-09-30 NOTE — BHH Suicide Risk Assessment (Signed)
[facility] Admission Suicide Risk Assessment ? ? ?Nursing information obtained from:  Patient ?Demographic factors:  Unemployed ?Current Mental Status:  NA ?Loss Factors:  NA ?Historical Factors:  NA ?Risk Reduction Factors:  Living with another person, especially a relative ? ?Total Time spent with patient: 1 hour ?Principal Problem: Schizoaffective disorder, bipolar type ([place]) ?Diagnosis:  Principal Problem: ?  Schizoaffective disorder, bipolar type ([place]) ? ?Subjective Data: 61 yo F with HTN, DM, CKD IIIa baseline ~2, depression and schizophrenia admitted to [facility] 4 days prior to this medical admission due to decompensated schizophrenia, paranoia and noncompliance.   ?  ?In the ER, EDP unclear who IVC'd the patient.  Patient admitted to noncompliance.  Complained of left arm and hand pain, chronic.  Endorsed some auditory hallucinations. ?  ?Was in treatment in [place] for several days, withdrawn per notes, but interactive.  On day prior to admission, she seemed more sluggish to nursing, not taking much PO, and had very flat, unemotional affect, with slow arm movements and some odd arm posturing.  Cr had been ~2, which may be her baseline. ?  ?She was started on empiric Ativan for suspected catatonia. ? ?Overnight, she fell around 3AM, had some ataxic gait per RN notes.   CTH obtained, normal.  LKN 5AM.  Around 7AM, patient was nonresponsive to sternal rub, appeared flaccid in all 4, nursing felt her left face was drooping.  CODE STROKE called. ? ?Continued Clinical Symptoms:  ?Alcohol Use Disorder Identification Test Final Score (AUDIT): 0 ?The "Alcohol Use Disorders Identification Test", Guidelines for Use in Primary Care, Second Edition.  World Pharmacologist [facility]). ?Score between 0-7:  no or low risk or alcohol related problems. ?Score between 8-15:  moderate risk of alcohol related problems. ?Score between 16-19:  high risk of alcohol related problems. ?Score 20 or above:  warrants further diagnostic  evaluation for alcohol dependence and treatment. ? ? ?CLINICAL FACTORS:  ? Schizophrenia:   Paranoid or undifferentiated type ? ? ?Musculoskeletal: ?Strength & Muscle Tone: within normal limits ?Gait & Station: unsteady ?Patient leans: N/A ? ?Psychiatric Specialty Exam: ? ?Presentation  ?General Appearance: Bizarre; Disheveled ? ?Eye Contact:Fair ? ?Speech:Garbled ? ?Speech Volume:Decreased ? ?Handedness:Right ? ? ?Mood and Affect  ?Mood:No data recorded ?Affect:Flat ? ? ?Thought Process  ?Thought Processes:Disorganized ? ?Descriptions of Associations:Loose ? ?Orientation:Partial ? ?Thought Content:Illogical ? ?History of Schizophrenia/Schizoaffective disorder:Yes ? ?Duration of Psychotic Symptoms:Greater than six months ? ?Hallucinations:No data recorded ?Ideas of Reference:No data recorded ?Suicidal Thoughts:No data recorded ?Homicidal Thoughts:No data recorded ? ?Sensorium  ?Memory:Immediate Poor ? ?Judgment:Impaired ? ?Insight:Lacking ? ? ?Executive Functions  ?Concentration:Poor ? ?Attention Span:Poor ? ?Recall:Poor ? ?Fund of Knowledge:Poor ? ?Language:Poor ? ? ?Psychomotor Activity  ?Psychomotor Activity:No data recorded ? ?Assets  ?Assets:Housing; Social Support ? ? ?Sleep  ?Sleep:No data recorded ? ? ?Blood pressure 117/62, pulse 91, temperature 98 ?F (36.7 ?C), temperature source Oral, resp. rate 20, height 5' 2.99" (1.6 m), weight 46.9 kg, SpO2 98 %. Body mass index is 18.34 kg/m?. ? ? ?COGNITIVE FEATURES THAT CONTRIBUTE TO RISK:  ?Loss of executive function   ? ?SUICIDE RISK:  ? Minimal: No identifiable suicidal ideation.  Patients presenting with no risk factors but with morbid ruminations; may be classified as minimal risk based on the severity of the depressive symptoms ? ?PLAN OF CARE: See orders ? ?I certify that inpatient services furnished can reasonably be expected to improve the patient's condition.  ? ?Parks Ranger, DO ?09/30/2021, 10:44 AM ? ?

## 2021-09-30 NOTE — Progress Notes (Signed)
Pt sitting on edge of bed; calm, cooperative. Pt states "I feel okay." She c/o L hand pain, which she rates 7 on 0-10 pain scale. Upon inspection, 1+ pitting edema noted in L hand and mild LUE swelling noted. She also c/o L palm itching. She denies SI/HI/AVH at this time. She reports that she slept "so-so" last night and says "I slept a little bit." She describes her appetite as good. She denies anxiety and depression at this time and says "I just want to get out of here." No acute distress noted. ?

## 2021-09-30 NOTE — Progress Notes (Signed)
Inpatient Diabetes Program Recommendations ? ?AACE/ADA: New Consensus Statement on Inpatient Glycemic Control  ? ?Target Ranges:  Prepandial:   less than 140 mg/dL ?     Peak postprandial:   less than 180 mg/dL (1-2 hours) ?     Critically ill patients:  140 - 180 mg/dL  ? ? ? Latest Reference Range & Units 09/29/21 07:34 09/29/21 12:05 09/29/21 17:04 09/29/21 21:12 09/30/21 07:53  ?Glucose-Capillary 70 - 99 mg/dL 158 (H) 211 (H) 138 (H) 344 (H) 200 (H)  ? ?Review of Glycemic Control ? ?Diabetes history: DM2 ?Outpatient Diabetes medications: None listed on med list; was on Januvia 100 mg daily and Metformin 500 mg BID  ?Current orders for Inpatient glycemic control: Novolog 0-15 units TID with meals, Novolog 0-5 units QHS ? ?Inpatient Diabetes Program Recommendations:   ? ?Oral DM medication: Please consider ordering Tradjenta 5 mg daily. ? ?Thanks, ?Barnie Alderman, RN, MSN, CDE ?Diabetes Coordinator ?Inpatient Diabetes Program ?[phone] (Team Pager from 8am to 5pm) ? ? ? ?

## 2021-09-30 NOTE — BH IP Treatment Plan (Signed)
Interdisciplinary Treatment and Diagnostic Plan Update ? ?09/30/2021 ?Time of Session: 10:30AM ?[place] ?MRN: [id number] ? ?Principal Diagnosis: Schizoaffective disorder, bipolar type ([place]) ? ?Secondary Diagnoses: Principal Problem: ?  Schizoaffective disorder, bipolar type ([place]) ? ? ?Current Medications:  ?Current Facility-Administered Medications  ?Medication Dose Route Frequency Provider Last Rate Last Admin  ? alum & mag hydroxide-simeth (MAALOX/MYLANTA) 200-200-20 MG/5ML suspension 30 mL  30 mL Oral Q4H PRN Parks Ranger, DO      ? amLODipine (NORVASC) tablet 10 mg  10 mg Oral Daily Parks Ranger, DO   10 mg at 09/30/21 1036  ? cefdinir (OMNICEF) capsule 300 mg  300 mg Oral Daily Parks Ranger, DO   300 mg at 09/30/21 1036  ? escitalopram (LEXAPRO) tablet 10 mg  10 mg Oral Daily Parks Ranger, DO   10 mg at 09/30/21 1114  ? hydrALAZINE (APRESOLINE) tablet 50 mg  50 mg Oral TID Parks Ranger, DO   50 mg at 09/30/21 0815  ? hydrochlorothiazide (HYDRODIURIL) tablet 12.5 mg  12.5 mg Oral Daily Parks Ranger, DO   12.5 mg at 09/30/21 1036  ? insulin aspart (novoLOG) injection 0-15 Units  0-15 Units Subcutaneous TID WC Parks Ranger, DO   8 Units at 09/30/21 1151  ? insulin aspart (novoLOG) injection 0-5 Units  0-5 Units Subcutaneous QHS Parks Ranger, DO   4 Units at 09/29/21 2112  ? ondansetron (ZOFRAN) tablet 4 mg  4 mg Oral Q6H PRN Parks Ranger, DO      ? Or  ? ondansetron (ZOFRAN) injection 4 mg  4 mg Intravenous Q6H PRN Parks Ranger, DO      ? paliperidone (INVEGA) 24 hr tablet 6 mg  6 mg Oral Daily Parks Ranger, DO   6 mg at 09/30/21 1115  ? polyethylene glycol (MIRALAX / GLYCOLAX) packet 17 g  17 g Oral Daily PRN Parks Ranger, DO      ? traZODone (DESYREL) tablet 100 mg  100 mg Oral QHS Parks Ranger, DO      ? ?PTA Medications: ?Medications Prior to Admission   ?Medication Sig Dispense Refill Last Dose  ? amLODipine (NORVASC) 10 MG tablet Take 1 tablet (10 mg total) by mouth daily.     ? cefdinir (OMNICEF) 300 MG capsule Take 1 capsule (300 mg total) by mouth daily for 3 days. 3 capsule 0   ? hydrALAZINE (APRESOLINE) 50 MG tablet Take 1 tablet (50 mg total) by mouth 3 (three) times daily.     ? hydrochlorothiazide (HYDRODIURIL) 12.5 MG tablet Take 1 tablet (12.5 mg total) by mouth daily.     ? ? ?Patient Stressors: Health problems   ? ?Patient Strengths: Religious Affiliation  ? ?Treatment Modalities: Medication Management, Group therapy, Case management,  ?1 to 1 session with clinician, Psychoeducation, Recreational therapy. ? ? ?Physician Treatment Plan for Primary Diagnosis: Schizoaffective disorder, bipolar type ([place]) ?Long Term Goal(s): Improvement in symptoms so as ready for discharge  ? ?Short Term Goals: Ability to identify changes in lifestyle to reduce recurrence of condition will improve ?Ability to verbalize feelings will improve ?Ability to disclose and discuss suicidal ideas ?Ability to demonstrate self-control will improve ?Ability to identify and develop effective coping behaviors will improve ?Ability to maintain clinical measurements within normal limits will improve ?Compliance with prescribed medications will improve ?Ability to identify triggers associated with substance abuse/mental health issues will improve ? ?Medication Management: Evaluate patient's response, side effects, and  tolerance of medication regimen. ? ?Therapeutic Interventions: 1 to 1 sessions, Unit Group sessions and Medication administration. ? ?Evaluation of Outcomes: Progressing ? ?Physician Treatment Plan for Secondary Diagnosis: Principal Problem: ?  Schizoaffective disorder, bipolar type ([place]) ? ?Long Term Goal(s): Improvement in symptoms so as ready for discharge  ? ?Short Term Goals: Ability to identify changes in lifestyle to reduce recurrence of condition will  improve ?Ability to verbalize feelings will improve ?Ability to disclose and discuss suicidal ideas ?Ability to demonstrate self-control will improve ?Ability to identify and develop effective coping behaviors will improve ?Ability to maintain clinical measurements within normal limits will improve ?Compliance with prescribed medications will improve ?Ability to identify triggers associated with substance abuse/mental health issues will improve    ? ?Medication Management: Evaluate patient's response, side effects, and tolerance of medication regimen. ? ?Therapeutic Interventions: 1 to 1 sessions, Unit Group sessions and Medication administration. ? ?Evaluation of Outcomes: Progressing ? ? ?RN Treatment Plan for Primary Diagnosis: Schizoaffective disorder, bipolar type ([place]) ?Long Term Goal(s): Knowledge of disease and therapeutic regimen to maintain health will improve ? ?Short Term Goals: Ability to remain free from injury will improve, Ability to demonstrate self-control, Ability to participate in decision making will improve, Ability to verbalize feelings will improve, Ability to identify and develop effective coping behaviors will improve, and Compliance with prescribed medications will improve ? ?Medication Management: RN will administer medications as ordered by provider, will assess and evaluate patient's response and provide education to patient for prescribed medication. RN will report any adverse and/or side effects to prescribing provider. ? ?Therapeutic Interventions: 1 on 1 counseling sessions, Psychoeducation, Medication administration, Evaluate responses to treatment, Monitor vital signs and CBGs as ordered, Perform/monitor CIWA, COWS, AIMS and Fall Risk screenings as ordered, Perform wound care treatments as ordered. ? ?Evaluation of Outcomes: Progressing ? ? ?LCSW Treatment Plan for Primary Diagnosis: Schizoaffective disorder, bipolar type ([place]) ?Long Term Goal(s): Safe transition to appropriate  next level of care at discharge, Engage patient in therapeutic group addressing interpersonal concerns. ? ?Short Term Goals: Engage patient in aftercare planning with referrals and resources, Increase social support, Increase ability to appropriately verbalize feelings, Increase emotional regulation, Facilitate acceptance of mental health diagnosis and concerns, Identify triggers associated with mental health/substance abuse issues, and Increase skills for wellness and recovery ? ?Therapeutic Interventions: Assess for all discharge needs, 1 to 1 time with Education officer, museum, Explore available resources and support systems, Assess for adequacy in community support network, Educate family and significant other(s) on suicide prevention, Complete Psychosocial Assessment, Interpersonal group therapy. ? ?Evaluation of Outcomes: Progressing ? ? ?Progress in Treatment: ?Attending groups: No. ?Participating in groups: No. ?Taking medication as prescribed: Yes. ?Toleration medication: Yes. ?Family/Significant other contact made: No, will contact:  when given permission ?Patient understands diagnosis: No. ?Discussing patient identified problems/goals with staff: Yes. ?Medical problems stabilized or resolved: Yes. ?Denies suicidal/homicidal ideation: Yes. ?Issues/concerns per patient self-inventory: No. ?Other: None ? ?New problem(s) identified: No, Describe:  None ? ?New Short Term/Long Term Goal(s): Patient to work towards elimination of symptoms of psychosis, medication management for mood stabilization;  development of comprehensive mental wellness plan. ? ? ?Patient Goals:  "to get better and taking my medication" ? ?Discharge Plan or Barriers: CSW will assist pt with development of appropriate discharge/aftercare plan ? ?Reason for Continuation of Hospitalization: Hallucinations ?Medication stabilization ? ?Estimated Length of Stay: TBD ? ? ?Scribe for Treatment Team: ?Davida Falconi A [place], [place] ?09/30/2021 ?1:43 PM ?

## 2021-09-30 NOTE — Progress Notes (Signed)
Pt declined pain medication for L hand pain, stating "I don't need it now." She also declined offer to have L palm clean and wash cloth placed in palm of hand to possibly help with itching and to keep hand open. Pt lying on L side with LUE behind head on pillow. Pt encouraged not to lie on LUE. No acute distress noted. ?

## 2021-09-30 NOTE — H&P (Signed)
Psychiatric Admission Assessment Adult  Patient Identification: Alicia Rogers MRN:  [id number] Date of Evaluation:  09/30/2021 Chief Complaint:  Schizoaffective disorder, bipolar type ([place]) [F25.0] Principal Diagnosis: Schizoaffective disorder, bipolar type ([place]) Diagnosis:  Principal Problem:   Schizoaffective disorder, bipolar type ([place])  History of Present Illness:   Patient returns today from IM Admission S/P fall with [name], HTN, DM, LUE Deformity.  Alicia Rogers is a 61 year old African-American female who presents on an involuntary commitment after being dropped off by her brother with whom she was staying.  Apparently, she has not been taking her medications.  She admits that.  She does hear voices but denies any suicidal ideation.  She admits to depression and chronic pain.  She apparently has not been taking any of her psychiatric or medical medications.  Discussed situation with social work who is going to contact her sister with whom she is closest and possibly the brother.  She states that her sister is trying to get her her own place.  She tells me that she goes to [employer].  I discussed restarting her medications and she was okay with that.   PER INITIAL INTAKE: Alicia Rogers, 61 y.o. female, Per triage note, Pt here with IVC papers with [facility]. Pt does not know why she is in the emergency department, swelling noted to left hand, per first rn was seen earlier today. Per ivc papers pt has schizophrenia and has not been taking her medications.  During the assessment with writer, patient is slow to respond to questions. Patient appears to be thought blocking. She is unaware of what brings her to ER this evening. She believes its due to her swollen feet.  She admits to not taking her diabetes medication be doesn't mention any other missed medications, specifically psychiatric medications.     Per TTS, pt does not know why she is in the emergency department,  swelling noted to left hand, per first rn was seen earlier today. Per ivc papers pt has schizophrenia and has not been taking her medications.During assessment patient appears alert and oriented x4, calm and cooperative. When asked if patient understands why she is in the ED she reports "my legs are swollen, the police brought me here." Patient continues to be unsure why she is presenting to the ED but when asked if she is experiencing AH she reports "yes, they tell me that they don't want me to be around anyone or have anything." Patient also reports that she has not slept in over a week. Patient reports poor appetite "I stay hungry all the time." When asked about her medication compliance she reports "I haven't taken my medications ina couple of days because I had a hard time swallowing." Patient does report that she has a outpatient psychiatrist at [facility]. Patient denies SI/HI/VH, reports AH  PER IM ADMISSION:  61 yo F with HTN, DM, CKD IIIa baseline ~2, depression and schizophrenia admitted to [facility] 4 days prior to this medical admission due to decompensated schizophrenia, paranoia and noncompliance.     In the ER, EDP unclear who IVC'd the patient.  Patient admitted to noncompliance.  Complained of left arm and hand pain, chronic.  Endorsed some auditory hallucinations.   Was in treatment in [place] for several days, withdrawn per notes, but interactive.  On day prior to admission, she seemed more sluggish to nursing, not taking much PO, and had very flat, unemotional affect, with slow arm movements and some odd arm  posturing.  Cr had been ~2, which may be her baseline.   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  Overnight, she fell around 3AM, had some ataxic gait per RN notes.   CTH obtained, normal.  LKN 5AM.  Around 7AM, patient was nonresponsive to sternal rub, appeared flaccid in all 4, nursing felt her left face was drooping.  [facility] [employer]  called.   Associated Signs/Symptoms: Depression Symptoms:  anhedonia, Duration of Depression Symptoms: Greater than two weeks  (Hypo) Manic Symptoms:   None Anxiety Symptoms:   Not really voiced Psychotic Symptoms:   History of schizophrenia but she denies any hallucinations PTSD Symptoms: NA Total Time spent with patient: 1 hour  Past Psychiatric History: Yes  Is the patient at risk to self? No.  Has the patient been a risk to self in the past 6 months? No.  Has the patient been a risk to self within the distant past? No.  Is the patient a risk to others? No.  Has the patient been a risk to others in the past 6 months? No.  Has the patient been a risk to others within the distant past? No.   Prior Inpatient Therapy:   Prior Outpatient Therapy:    Alcohol Screening: 1. How often do you have a drink containing alcohol?: Never 2. How many drinks containing alcohol do you have on a typical day when you are drinking?: 1 or 2 3. How often do you have six or more drinks on one occasion?: Never AUDIT-C Score: 0 4. How often during the last year have you found that you were not able to stop drinking once you had started?: Never 5. How often during the last year have you failed to do what was normally expected from you because of drinking?: Never 6. How often during the last year have you needed a first drink in the morning to get yourself going after a heavy drinking session?: Never 7. How often during the last year have you had a feeling of guilt of remorse after drinking?: Never 8. How often during the last year have you been unable to remember what happened the night before because you had been drinking?: Never 9. Have you or someone else been injured as a result of your drinking?: No 10. Has a relative or friend or a doctor or another health worker been concerned about your drinking or suggested you cut down?: No Alcohol Use Disorder Identification Test Final Score (AUDIT):  0 Substance Abuse History in the last 12 months:  No. Consequences of Substance Abuse: NA Previous Psychotropic Medications: Yes  Psychological Evaluations: No  Past Medical History:  Past Medical History:  Diagnosis Date   Depression    Diabetes mellitus without complication ([place])    Diabetes mellitus, type II ([place])    Hypertension    Schizophrenia ([place])    History reviewed. No pertinent surgical history. Family History:  Family History  Problem Relation Age of Onset   Diabetes Mother    Glaucoma Mother    Heart disease Mother    Heart attack Mother    Depression Mother    Hypertension Mother    Diabetes Father    Glaucoma Father    Hypertension Father    Diabetes Sister    Depression Sister    Post-traumatic stress disorder Sister    Family Psychiatric  History: Unkown Tobacco Screening:   Social History:  Social History   Substance and Sexual Activity  Alcohol Use No  Alcohol/week: 0.0 standard drinks     Social History   Substance and Sexual Activity  Drug Use No    Additional Social History:                           Allergies:   Allergies  Allergen Reactions   Penicillins     Rash   Lab Results:  Results for orders placed or performed during the hospital encounter of 09/29/21 (from the past 48 hour(s))  Glucose, capillary     Status: Abnormal   Collection Time: 09/29/21  5:04 PM  Result Value Ref Range   Glucose-Capillary 138 (H) 70 - 99 mg/dL    Comment: Glucose reference range applies only to samples taken after fasting for at least 8 hours.  Glucose, capillary     Status: Abnormal   Collection Time: 09/29/21  9:12 PM  Result Value Ref Range   Glucose-Capillary 344 (H) 70 - 99 mg/dL    Comment: Glucose reference range applies only to samples taken after fasting for at least 8 hours.   Comment 1 Notify RN   Glucose, capillary     Status: Abnormal   Collection Time: 09/30/21  7:53 AM  Result Value Ref Range   Glucose-Capillary  200 (H) 70 - 99 mg/dL    Comment: Glucose reference range applies only to samples taken after fasting for at least 8 hours.    Blood Alcohol level:  Lab Results  Component Value Date   ETH <10 09/20/2021   ETH <10 64/33/2951    Metabolic Disorder Labs:  Lab Results  Component Value Date   HGBA1C 8.0 (H) 09/27/2021   MPG 182.9 09/27/2021   MPG 180.03 10/13/2020   No results found for: PROLACTIN Lab Results  Component Value Date   CHOL 267 (H) 10/13/2020   TRIG 180 (H) 10/13/2020   HDL 86 10/13/2020   CHOLHDL 3.1 10/13/2020   VLDL 36 10/13/2020   LDLCALC 145 (H) 10/13/2020   LDLCALC 158 (H) 02/29/2020    Current Medications: Current Facility-Administered Medications  Medication Dose Route Frequency Provider Last Rate Last Admin   alum & mag hydroxide-simeth (MAALOX/MYLANTA) 200-200-20 MG/5ML suspension 30 mL  30 mL Oral Q4H PRN Parks Ranger, DO       amLODipine (NORVASC) tablet 10 mg  10 mg Oral Daily Parks Ranger, DO   10 mg at 09/30/21 1036   cefdinir (OMNICEF) capsule 300 mg  300 mg Oral Daily Parks Ranger, DO   300 mg at 09/30/21 1036   escitalopram (LEXAPRO) tablet 10 mg  10 mg Oral Daily Parks Ranger, DO       hydrALAZINE (APRESOLINE) tablet 50 mg  50 mg Oral TID Parks Ranger, DO   50 mg at 09/30/21 0815   hydrochlorothiazide (HYDRODIURIL) tablet 12.5 mg  12.5 mg Oral Daily Parks Ranger, DO   12.5 mg at 09/30/21 1036   insulin aspart (novoLOG) injection 0-15 Units  0-15 Units Subcutaneous TID WC Parks Ranger, DO   3 Units at 09/30/21 8841   insulin aspart (novoLOG) injection 0-5 Units  0-5 Units Subcutaneous QHS Parks Ranger, DO   4 Units at 09/29/21 2112   ondansetron (ZOFRAN) tablet 4 mg  4 mg Oral Q6H PRN Parks Ranger, DO       Or   ondansetron [facility]) injection 4 mg  4 mg Intravenous Q6H PRN Parks Ranger, DO  paliperidone (INVEGA) 24 hr tablet 6 mg  6  mg Oral Daily Parks Ranger, DO       polyethylene glycol (MIRALAX / GLYCOLAX) packet 17 g  17 g Oral Daily PRN Parks Ranger, DO       traZODone (DESYREL) tablet 100 mg  100 mg Oral QHS Parks Ranger, DO       PTA Medications: Medications Prior to Admission  Medication Sig Dispense Refill Last Dose   amLODipine (NORVASC) 10 MG tablet Take 1 tablet (10 mg total) by mouth daily.      cefdinir (OMNICEF) 300 MG capsule Take 1 capsule (300 mg total) by mouth daily for 3 days. 3 capsule 0    hydrALAZINE (APRESOLINE) 50 MG tablet Take 1 tablet (50 mg total) by mouth 3 (three) times daily.      hydrochlorothiazide (HYDRODIURIL) 12.5 MG tablet Take 1 tablet (12.5 mg total) by mouth daily.       Musculoskeletal: Strength & Muscle Tone: within normal limits Gait & Station: unsteady Patient leans: N/A            Psychiatric Specialty Exam:  Presentation  General Appearance: Bizarre; Disheveled  Eye Contact:Fair  Speech:Garbled  Speech Volume:Decreased  Handedness:Right   Mood and Affect  Mood:No data recorded Affect:Flat   Thought Process  Thought Processes:Disorganized  Duration of Psychotic Symptoms: Greater than six months  Past Diagnosis of Schizophrenia or Psychoactive disorder: Yes  Descriptions of Associations:Loose  Orientation:Partial  Thought Content:Illogical  Hallucinations:No data recorded Ideas of Reference:No data recorded Suicidal Thoughts:No data recorded Homicidal Thoughts:No data recorded  Sensorium  Memory:Immediate Poor  Judgment:Impaired  Insight:Lacking   Executive Functions  Concentration:Poor  Attention Span:Poor  Recall:Poor  Fund of Knowledge:Poor  Language:Poor   Psychomotor Activity  Psychomotor Activity:No data recorded  Assets  Assets:Housing; Social Support   Sleep  Sleep:No data recorded   Physical Exam: Physical Exam Constitutional:      Appearance: Normal  appearance.  HENT:     Head: Normocephalic and atraumatic.     Mouth/Throat:     Pharynx: Oropharynx is clear.  Eyes:     Pupils: Pupils are equal, round, and reactive to light.  Cardiovascular:     Rate and Rhythm: Normal rate and regular rhythm.  Pulmonary:     Effort: Pulmonary effort is normal.     Breath sounds: Normal breath sounds.  Abdominal:     General: Abdomen is flat.     Palpations: Abdomen is soft.  Musculoskeletal:        General: Swelling and deformity present. Normal range of motion.  Skin:    General: Skin is warm and dry.  Neurological:     General: No focal deficit present.     Mental Status: She is alert. Mental status is at baseline.  Psychiatric:        Attention and Perception: Attention and perception normal.        Mood and Affect: Mood and affect normal.        Speech: Speech is slurred and tangential.        Behavior: Behavior is cooperative.        Thought Content: Thought content is paranoid.        Cognition and Memory: Memory normal. Cognition is impaired.        Judgment: Judgment normal.   Review of Systems  Constitutional: Negative.   HENT: Negative.    Eyes: Negative.   Respiratory: Negative.    Cardiovascular: Negative.  Gastrointestinal: Negative.   Genitourinary: Negative.   Musculoskeletal:  Positive for falls.  Skin: Negative.   Neurological:  Positive for weakness.  Endo/Heme/Allergies: Negative.   Psychiatric/Behavioral: Negative.    Blood pressure 117/62, pulse 91, temperature 98 F (36.7 C), temperature source Oral, resp. rate 20, height 5' 2.99" (1.6 m), weight 46.9 kg, SpO2 98 %. Body mass index is 18.34 kg/m.  Treatment Plan Summary: Daily contact with patient to assess and evaluate symptoms and progress in treatment, Medication management, and Plan restart her psychiatric medications.  Observation Level/Precautions:  15 minute checks  Laboratory:  CBC Chemistry Profile HbAIC  Psychotherapy:    Medications:     Consultations:    Discharge Concerns:    Estimated LOS:  Other:     Physician Treatment Plan for Primary Diagnosis: Schizoaffective disorder, bipolar type ([place]) Long Term Goal(s): Improvement in symptoms so as ready for discharge  Short Term Goals: Ability to identify changes in lifestyle to reduce recurrence of condition will improve, Ability to verbalize feelings will improve, Ability to disclose and discuss suicidal ideas, Ability to demonstrate self-control will improve, Ability to identify and develop effective coping behaviors will improve, Ability to maintain clinical measurements within normal limits will improve, Compliance with prescribed medications will improve, and Ability to identify triggers associated with substance abuse/mental health issues will improve  Physician Treatment Plan for Secondary Diagnosis: Principal Problem:   Schizoaffective disorder, bipolar type ([place])   I certify that inpatient services furnished can reasonably be expected to improve the patient's condition.    Parks Ranger, DO 3/1/202310:46 AM

## 2021-10-01 DIAGNOSIS — F250 Schizoaffective disorder, bipolar type: Secondary | ICD-10-CM | POA: Diagnosis not present

## 2021-10-01 LAB — GLUCOSE, CAPILLARY
Glucose-Capillary: 208 mg/dL — ABNORMAL HIGH (ref 70–99)
Glucose-Capillary: 232 mg/dL — ABNORMAL HIGH (ref 70–99)
Glucose-Capillary: 292 mg/dL — ABNORMAL HIGH (ref 70–99)
Glucose-Capillary: 223 mg/dL — ABNORMAL HIGH (ref 70–99)

## 2021-10-01 NOTE — B[facility] Counselor (Addendum)
Adult Comprehensive Assessment ? ?Patient ID: Alicia Rogers, female   DOB: Feb 07, 1961, 61 y.o.   MRN: [id number] ? ?Information Source: ?Information source: Patient (Partial assessment completed from previous clinical comprehensive assessment 09/23/21) ? ?Current Stressors:  ?Patient states their primary concerns and needs for treatment are:: "my hand" ?Patient states their goals for this hospitilization and ongoing recovery are:: "to get healthier, to live in a better place" ?Educational / Learning stressors: Pt denies ?Employment / Job issues: Pt denies ?Family Relationships: Pt states she is having some issues living with her brother ?Financial / Lack of resources (include bankruptcy): Pt states that someone is taking her money ?Housing / Lack of housing: Pt states she not sure she wants to return to her brother's home ?Physical health (include injuries & life threatening diseases): Pt has swelling in her left hand ?Social relationships: Pt denies ?Substance abuse: "smoke cigarettes, but haven't in a while" ?Bereavement / Loss: "mom and dad, December and February of last year" ?  ?Living/Environment/Situation:  ?Living Arrangements: Other relatives (Brother) ?Living conditions (as described by patient or guardian): Pt states too many people are in and out of her brother's house ?How long has patient lived in current situation?: 5 months ?What is atmosphere in current home: Chaotic ?  ?Family History:  ?Marital status: Single ?Are you sexually active?:  (unable to assess) ?What is your sexual orientation?: unable to assess ?Has your sexual activity been affected by drugs, alcohol, medication, or emotional stress?: unable to assess ?Does patient have children?: No ?  ?Childhood History:  ?By whom was/is the patient raised?: Both parents ?Additional childhood history information: "it was like a family family" ?Description of patient's relationship with caregiver when they were a child: Pt states she had a good  relationship with her parents ?Patient's description of current relationship with people who raised him/her: Deceased ?How were you disciplined when you got in trouble as a child/adolescent?: "i got a couple of woopings" ?Does patient have siblings?: Yes ?Number of Siblings: 4 (3 brothers, 1 sister) ?Description of patient's current relationship with siblings: Pt states she has an ok relationship with her siblings, and one of her brothers is deceased ?Did patient suffer any verbal/emotional/physical/sexual abuse as a child?: No ?Did patient suffer from severe childhood neglect?: No ?Has patient ever been sexually abused/assaulted/raped as an adolescent or adult?: No ?Was the patient ever a victim of a crime or a disaster?:  (Pt states that someone is taking her money) ?Witnessed domestic violence?: No ?Has patient been affected by domestic violence as an adult?: No ?  ?Education:  ?Highest grade of school patient has completed: 2 masters, "business and business management" ?Currently a student?: No ?Learning disability?: No ?  ?Employment/Work Situation:   ?Employment Situation: On disability ?Why is Patient on Disability: mental health diagnosis ?How Long has Patient Been on Disability: unable to assess ?Patient's Job has Been Impacted by Current Illness: No ?What is the Longest Time Patient has Held a Job?: 5 years ?Where was the Patient Employed at that Time?: [employer] ?Has Patient ever Been in the Military?: No ?  ?Financial Resources:   ?Museum/gallery curator resources: [employer], Medicare ?Does patient have a representative payee or guardian?: No ?  ?Alcohol/Substance Abuse:   ?What has been your use of drugs/alcohol within the last 12 months?: Pt states that she used to smoke cigarettes but does not smoke much anymore ?If attempted suicide, did drugs/alcohol play a role in this?: No ?Alcohol/Substance Abuse Treatment Hx: Denies past history ?Has  alcohol/substance abuse ever caused legal problems?: No ?   ?Social Support System:   ?Heritage manager System: None ?Describe Community Support System: "nobody helps me, I help myself" ?Type of faith/religion: Baptist ?How does patient's faith help to cope with current illness?: "have my Bible" ?  ?Leisure/Recreation:   ?Do You Have Hobbies?: Yes ?Leisure and Hobbies: Pt states she used to get her hair and nails done ?  ?Strengths/Needs:   ?What is the patient's perception of their strengths?: unable to assess ?Patient states they can use these personal strengths during their treatment to contribute to their recovery: unable to assess ?Patient states these barriers may affect/interfere with their treatment: Pt denies ?Patient states these barriers may affect their return to the community: Pt states that "my brother doesn't like me right now" ?Other important information patient would like considered in planning for their treatment: Pt denies ?  ?Discharge Plan:   ?Currently receiving community mental health services: No. Per pt's sister, she is receiving services for physical health but does not seem to have a psychiatric provider.  ?Patient states concerns and preferences for aftercare planning are: Pt states she is unsure what she wants for follow up care ?Patient states they will know when they are safe and ready for discharge when: "be ok if I can stay with a relative" ?Does patient have access to transportation?: Yes ?Does patient have financial barriers related to discharge medications?: No ?Plan for living situation after discharge: CSW will assist pt in discharge planning ?Will patient be returning to same living situation after discharge?: Yes (Pt is not sure she can return home to her brother's or sisters but pt states that she may be able to live with her nephew. CSW will assist pt with housing resources) ?  ?Summary/Recommendations:   ?Summary and Recommendations (to be completed by the evaluator): Patient is a 61 year-old female, single, [facility]  [place] [place] [facility] She reports that she receives SSDI and is currently unemployed. She presents to the hospital involuntarily after being discharged from the medical floor due to medical issues and being unresponsive. Patient was previously admitted to [facility] on 09/21/21 to 09/27/21 following family member, claiming pt has not been taking her psychiatric medications and is experiencing auditory hallucinations. Recent stressors include onset of depressive symptoms of increased appetite, inability to sleep, familial strain with her brother, and medical issues. She has a primary diagnosis of Schizoaffective disorder, depressive type. She states she does not want to go back to her brother's home and that her sister will assist her in finding temporary housing. Currently she states that she can live with her nephew and also says she wants assistance with housing resources. Recommendations include: crisis stabilization, therapeutic milieu, encourage group attendance and participation, medication management for mood stabilization and development of comprehensive mental wellness plan. ? ?Kylah Maresh A [place]. 10/01/2021 ?

## 2021-10-01 NOTE — Progress Notes (Signed)
Patient denies SI, HI, and AVH. Patient gives delayed responses to assessment questions. She reports pain in her left hand and rates it as a 4/10 but denies needing any pain medication. Patient reports sleep and appetite as "ok". Patient ate 80% of her breakfast this morning. She is compliant with morning medications. Patient is visibile in the milieu and has been in the dayroom since breakfast, but interacts minimally with others. Patient remains safe on the unit at this time. ?

## 2021-10-01 NOTE — Progress Notes (Signed)
Recreation Therapy Notes ? ?Date: 10/01/2021  ? ?Time: 1:35 pm   ? ?Location: Craft room   ? ?Behavioral response: Appropriate ? ?Intervention Topic:  Creative expressions    ? ?Discussion/Intervention:  ?Group content on today was focused on creative expressions. The group defined creative expressions and ways they use creative expressions. Individual identified other positive ways creative expressions can be used and why it is important to express yourself. Patients participated in the intervention ?expressive shading?, where they had a chance to creatively express themselves. ? ?Clinical Observations/Feedback: ?Patient came to group and was focused on what peers and staff had to say about creative expressions. Individual was social with peers and staff while participating in the intervention.   ?Fortune Torosian LRT/CTRS  ? ? ? ? ? ? ? ? ?Jennfier Abdulla ?10/01/2021 4:27 PM ?

## 2021-10-01 NOTE — Progress Notes (Signed)
[facility] MD Progress Note  10/01/2021 11:17 AM Alicia Rogers  MRN:  [id number] Subjective: Alicia Rogers signed a voluntary paperwork.  She is pleasant and cooperative.  I told her that social work is looking for some assistance for living and support.  She looks better today.  She is taking her medications as prescribed and denies any side effects.  Her blood pressure is controlled.     Principal Problem: Schizoaffective disorder, bipolar type ([place]) Diagnosis: Principal Problem:   Schizoaffective disorder, bipolar type ([place])  Total Time spent with patient: 15 minutes  Past Psychiatric History: [place]  Past Medical History:  Past Medical History:  Diagnosis Date   Depression    Diabetes mellitus without complication ([place])    Diabetes mellitus, type II ([place])    Hypertension    Schizophrenia ([place])    History reviewed. No pertinent surgical history. Family History:  Family History  Problem Relation Age of Onset   Diabetes Mother    Glaucoma Mother    Heart disease Mother    Heart attack Mother    Depression Mother    Hypertension Mother    Diabetes Father    Glaucoma Father    Hypertension Father    Diabetes Sister    Depression Sister    Post-traumatic stress disorder Sister     Social History:  Social History   Substance and Sexual Activity  Alcohol Use No   Alcohol/week: 0.0 standard drinks     Social History   Substance and Sexual Activity  Drug Use No    Social History   Socioeconomic History   Marital status: Single    Spouse name: Not on file   Number of children: Not on file   Years of education: Not on file   Highest education level: Not on file  Occupational History   Not on file  Tobacco Use   Smoking status: Every Day    Packs/day: 1.00    Types: Cigarettes    Start date: 04/21/1978   Smokeless tobacco: Never  Vaping Use   Vaping Use: Never used  Substance and Sexual Activity   Alcohol use: No    Alcohol/week: 0.0 standard  drinks   Drug use: No   Sexual activity: Not Currently  Other Topics Concern   Not on file  Social History Narrative   Not on file   Social Determinants of Health   Financial Resource Strain: Not on file  Food Insecurity: Not on file  Transportation Needs: Not on file  Physical Activity: Not on file  Stress: Not on file  Social Connections: Not on file   Additional Social History:                         Sleep: Good  Appetite:  Good  Current Medications: Current Facility-Administered Medications  Medication Dose Route Frequency Provider Last Rate Last Admin   acetaminophen (TYLENOL) tablet 650 mg  650 mg Oral Q6H PRN Caroline Sauger, NP       alum & mag hydroxide-simeth (MAALOX/MYLANTA) 200-200-20 MG/5ML suspension 30 mL  30 mL Oral Q4H PRN Parks Ranger, DO       amLODipine (NORVASC) tablet 10 mg  10 mg Oral Daily Parks Ranger, DO   10 mg at 10/01/21 0900   escitalopram (LEXAPRO) tablet 10 mg  10 mg Oral Daily Parks Ranger, DO   10 mg at 10/01/21 0900   hydrALAZINE (APRESOLINE) tablet 50 mg  50 mg Oral TID Parks Ranger, DO   50 mg at 10/01/21 0805   hydrochlorothiazide (HYDRODIURIL) tablet 12.5 mg  12.5 mg Oral Daily Parks Ranger, DO   12.5 mg at 10/01/21 0900   insulin aspart (novoLOG) injection 0-15 Units  0-15 Units Subcutaneous TID WC Parks Ranger, DO   5 Units at 10/01/21 0805   insulin aspart (novoLOG) injection 0-5 Units  0-5 Units Subcutaneous QHS Parks Ranger, DO   2 Units at 09/30/21 2233   ondansetron (ZOFRAN) tablet 4 mg  4 mg Oral Q6H PRN Parks Ranger, DO       Or   ondansetron [facility]) injection 4 mg  4 mg Intravenous Q6H PRN Parks Ranger, DO       paliperidone (INVEGA) 24 hr tablet 6 mg  6 mg Oral Daily Parks Ranger, DO   6 mg at 10/01/21 0900   polyethylene glycol (MIRALAX / GLYCOLAX) packet 17 g  17 g Oral Daily PRN Parks Ranger, DO       traZODone (DESYREL) tablet 100 mg  100 mg Oral QHS Parks Ranger, DO   100 mg at 09/30/21 2230    Lab Results:  Results for orders placed or performed during the hospital encounter of 09/29/21 (from the past 48 hour(s))  Glucose, capillary     Status: Abnormal   Collection Time: 09/29/21  5:04 PM  Result Value Ref Range   Glucose-Capillary 138 (H) 70 - 99 mg/dL    Comment: Glucose reference range applies only to samples taken after fasting for at least 8 hours.  Glucose, capillary     Status: Abnormal   Collection Time: 09/29/21  9:12 PM  Result Value Ref Range   Glucose-Capillary 344 (H) 70 - 99 mg/dL    Comment: Glucose reference range applies only to samples taken after fasting for at least 8 hours.   Comment 1 Notify RN   Glucose, capillary     Status: Abnormal   Collection Time: 09/30/21  7:53 AM  Result Value Ref Range   Glucose-Capillary 200 (H) 70 - 99 mg/dL    Comment: Glucose reference range applies only to samples taken after fasting for at least 8 hours.  Glucose, capillary     Status: Abnormal   Collection Time: 09/30/21 11:44 AM  Result Value Ref Range   Glucose-Capillary 251 (H) 70 - 99 mg/dL    Comment: Glucose reference range applies only to samples taken after fasting for at least 8 hours.  Glucose, capillary     Status: Abnormal   Collection Time: 09/30/21  4:22 PM  Result Value Ref Range   Glucose-Capillary 285 (H) 70 - 99 mg/dL    Comment: Glucose reference range applies only to samples taken after fasting for at least 8 hours.  Glucose, capillary     Status: Abnormal   Collection Time: 09/30/21 10:07 PM  Result Value Ref Range   Glucose-Capillary 228 (H) 70 - 99 mg/dL    Comment: Glucose reference range applies only to samples taken after fasting for at least 8 hours.   Comment 1 Notify RN   Glucose, capillary     Status: Abnormal   Collection Time: 10/01/21  8:00 AM  Result Value Ref Range   Glucose-Capillary 208 (H) 70 - 99  mg/dL    Comment: Glucose reference range applies only to samples taken after fasting for at least 8 hours.    Blood Alcohol level:  Lab Results  Component Value Date   ETH <10 09/20/2021   ETH <10 41/63/8453    Metabolic Disorder Labs: Lab Results  Component Value Date   HGBA1C 8.0 (H) 09/27/2021   MPG 182.9 09/27/2021   MPG 180.03 10/13/2020   No results found for: PROLACTIN Lab Results  Component Value Date   CHOL 267 (H) 10/13/2020   TRIG 180 (H) 10/13/2020   HDL 86 10/13/2020   CHOLHDL 3.1 10/13/2020   VLDL 36 10/13/2020   LDLCALC 145 (H) 10/13/2020   LDLCALC 158 (H) 02/29/2020    Physical Findings: AIMS:  , ,  ,  ,    CIWA:    COWS:     Musculoskeletal: Strength & Muscle Tone: within normal limits Gait & Station: normal Patient leans: N/A  Psychiatric Specialty Exam:  Presentation  General Appearance: Bizarre; Disheveled  Eye Contact:Fair  Speech:Garbled  Speech Volume:Decreased  Handedness:Right   Mood and Affect  Mood:No data recorded Affect:Flat   Thought Process  Thought Processes:Disorganized  Descriptions of Associations:Loose  Orientation:Partial  Thought Content:Illogical  History of Schizophrenia/Schizoaffective disorder:Yes  Duration of Psychotic Symptoms:Greater than six months  Hallucinations:No data recorded Ideas of Reference:No data recorded Suicidal Thoughts:No data recorded Homicidal Thoughts:No data recorded  Sensorium  Memory:Immediate Poor  Judgment:Impaired  Insight:Lacking   Executive Functions  Concentration:Poor  Attention Span:Poor  Recall:Poor  Fund of Knowledge:Poor  Language:Poor   Psychomotor Activity  Psychomotor Activity:No data recorded  Assets  Assets:Housing; Social Support   Sleep  Sleep:No data recorded   Physical Exam: Physical Exam Vitals and nursing note reviewed.  Constitutional:      Appearance: Normal appearance. She is normal weight.  Neurological:      General: No focal deficit present.     Mental Status: She is alert and oriented to person, place, and time.  Psychiatric:        Attention and Perception: Attention and perception normal.        Mood and Affect: Mood is anxious and depressed.        Speech: Speech is delayed and tangential.        Behavior: Behavior normal. Behavior is cooperative.        Thought Content: Thought content normal.        Cognition and Memory: Cognition is impaired. Memory is impaired.        Judgment: Judgment normal.   Review of Systems  Constitutional: Negative.   HENT: Negative.    Eyes: Negative.   Respiratory: Negative.    Cardiovascular: Negative.   Gastrointestinal: Negative.   Genitourinary: Negative.   Musculoskeletal: Negative.   Skin: Negative.   Neurological: Negative.   Endo/Heme/Allergies: Negative.   Psychiatric/Behavioral:  Positive for depression and memory loss.   Blood pressure 133/63, pulse 76, temperature 98.1 F (36.7 C), resp. rate 18, height 5' 2.99" (1.6 m), weight 46.9 kg, SpO2 95 %. Body mass index is 18.34 kg/m.   Treatment Plan Summary: Daily contact with patient to assess and evaluate symptoms and progress in treatment, Medication management, and Plan psychiatric medications restarted.  Patient signed voluntary.  Parks Ranger, DO 10/01/2021, 11:17 AM

## 2021-10-01 NOTE — [facility] Suicide Risk Assessment (Addendum)
[facility] INPATIENT:  Family/Significant Other Suicide Prevention Education ? ?Suicide Prevention Education:  ?Education Completed; [name], sister (name of family member/significant other) has been identified by the patient as the family member/significant other with whom the patient will be residing, and identified as the person(s) who will aid the patient in the event of a mental health crisis (suicidal ideations/suicide attempt).  With written consent from the patient, the family member/significant other has been provided the following suicide prevention education, prior to the and/or following the discharge of the patient. ? ?The suicide prevention education provided includes the following: ?Suicide risk factors ?Suicide prevention and interventions ?National Suicide Hotline telephone number ?[facility] assessment telephone number ?[facility] Emergency Assistance 911 ?[place] and/or Residential Mobile Crisis Unit telephone number ? ?Request made of family/significant other to: ?Remove weapons (e.g., guns, rifles, knives), all items previously/currently identified as safety concern.   ?Remove drugs/medications (over-the-counter, prescriptions, illicit drugs), all items previously/currently identified as a safety concern. ? ?The family member/significant other verbalizes understanding of the suicide prevention education information provided.  The family member/significant other agrees to remove the items of safety concern listed above. ? ?Alicia Rogers ?10/01/2021, 12:55 PM ?

## 2021-10-01 NOTE — Progress Notes (Signed)
Inpatient Diabetes Program Recommendations ? ?AACE/ADA: New Consensus Statement on Inpatient Glycemic Control  ? ?Target Ranges:  Prepandial:   less than 140 mg/dL ?     Peak postprandial:   less than 180 mg/dL (1-2 hours) ?     Critically ill patients:  140 - 180 mg/dL  ? ? Latest Reference Range & Units 09/30/21 07:53 09/30/21 11:44 09/30/21 16:22 09/30/21 22:07 10/01/21 08:00  ?Glucose-Capillary 70 - 99 mg/dL 200 (H) 251 (H) 285 (H) 228 (H) 208 (H)  ? ?Review of Glycemic Control ? ?Diabetes history: DM2 ?Outpatient Diabetes medications: None listed on med list; was on Januvia 100 mg daily and Metformin 500 mg BID  ?Current orders for Inpatient glycemic control: Novolog 0-15 units TID with meals, Novolog 0-5 units QHS ?  ?Inpatient Diabetes Program Recommendations:   ?  ?Oral DM medication: Please consider ordering Tradjenta 5 mg daily. ?  ?Thanks, ?Barnie Alderman, RN, MSN, CDE ?Diabetes Coordinator ?Inpatient Diabetes Program ?[phone] (Team Pager from 8am to 5pm) ? ?

## 2021-10-01 NOTE — Progress Notes (Signed)
Pt sitting at table in dayroom; calm, cooperative. Pt states "I feel bad right now." Pt c/o L hand pain, which she rates 7 on 0-10 pain scale and says "My hand bothers me sometimes and my stomach, I don't know what it is. Probably a little stomach acid or something like that." She denies Si/HI/AVH, anxiety and depression at this time. She reports "I didn't sleep that good" and says that she had trouble staying asleep last night. She describes her appetite as "good". No acute distress noted. ?

## 2021-10-02 DIAGNOSIS — F250 Schizoaffective disorder, bipolar type: Secondary | ICD-10-CM | POA: Diagnosis not present

## 2021-10-02 LAB — CULTURE, BLOOD (ROUTINE X 2)
Culture: NO GROWTH
Culture: NO GROWTH
Special Requests: ADEQUATE

## 2021-10-02 LAB — GLUCOSE, CAPILLARY
Glucose-Capillary: 157 mg/dL — ABNORMAL HIGH (ref 70–99)
Glucose-Capillary: 165 mg/dL — ABNORMAL HIGH (ref 70–99)
Glucose-Capillary: 224 mg/dL — ABNORMAL HIGH (ref 70–99)
Glucose-Capillary: 217 mg/dL — ABNORMAL HIGH (ref 70–99)
Glucose-Capillary: 262 mg/dL — ABNORMAL HIGH (ref 70–99)

## 2021-10-02 NOTE — Plan of Care (Signed)
Patient remain alert and oriented with some confusion, disorganized thought, and flat affect. Slow unsteady gait. Denies SI, HI, AVH. Denies pain or discomfort at this time. Denies anxiety and depression. Ate meals in the day room among peers with good appetite. Compliant with medications. Patient observed having an incontinent episode while sitting in chair in the day room. Patient assisted back to her room and showered and change into clean scrub.  Chair was sanitize. Bed linen change. Patient remain safe on the unit with Q 15 minute safety checks. ? ?Problem: Education: ?Goal: Knowledge of [place] [facility] Education information/materials will improve ?Outcome: Progressing ?Goal: Emotional status will improve ?Outcome: Progressing ?Goal: Mental status will improve ?Outcome: Progressing ?Goal: Verbalization of understanding the information provided will improve ?Outcome: Progressing ?  ?Problem: Activity: ?Goal: Interest or engagement in activities will improve ?Outcome: Progressing ?Goal: Sleeping patterns will improve ?Outcome: Progressing ?  ?Problem: Coping: ?Goal: Ability to verbalize frustrations and anger appropriately will improve ?Outcome: Progressing ?Goal: Ability to demonstrate self-control will improve ?Outcome: Progressing ?  ?Problem: Health Behavior/Discharge Planning: ?Goal: Identification of resources available to assist in meeting health care needs will improve ?Outcome: Progressing ?Goal: Compliance with treatment plan for underlying cause of condition will improve ?Outcome: Progressing ?  ?Problem: Physical Regulation: ?Goal: Ability to maintain clinical measurements within normal limits will improve ?Outcome: Progressing ?  ?Problem: Safety: ?Goal: Periods of time without injury will increase ?Outcome: Progressing ?  ?

## 2021-10-02 NOTE — Progress Notes (Signed)
Recreation Therapy Notes ? ?INPATIENT RECREATION THERAPY ASSESSMENT ? ?Patient Details ?Name: Alicia Rogers ?MRN: [id number] ?DOB: [birth year] ?Today's Date: 10/02/2021 ?      ?Information Obtained From: ?Patient ? ?Able to Participate in Assessment/Interview: ?Yes ? ?Patient Presentation: ?Responsive ? ?Reason for Admission (Per Patient): ?Patient Unable to Identify ? ?Patient Stressors: ?  ? ?Coping Skills:   ?TV, Music, Talk ? ?Leisure Interests (2+):  ?Art - Coloring, Exercise - Walking, Games - Jig-saw puzzles, Music - Listen, Individual - TV ? ?Frequency of Recreation/Participation: ?Monthly ? ?Awareness of Community Resources:  ?Yes ? ?Community Resources:  ?Social worker, [employer] ? ?Current Use: ?Yes ? ?If no, Barriers?: ?  ? ?Expressed Interest in [employer] Information: ?Yes ? ?[place] of Residence:  ?[place] ? ?Patient Main Form of Transportation: ?[employer]ber/Lyft ? ?Patient Strengths:  ?Help others ? ?Patient Identified Areas of Improvement:  ?My hand ? ?Patient Goal for Hospitalization:  ?Be healthy ? ?Current SI (including self-harm):  ?No ? ?Current HI:  ?No ? ?Current AVH: ?No ? ?Staff Intervention Plan: ?Group Attendance, Collaborate with Interdisciplinary Treatment Team ? ?Consent to Intern Participation: ?N/A ? ?Kristeena Meineke ?10/02/2021, 9:12 AM ?

## 2021-10-02 NOTE — Evaluation (Signed)
Occupational Therapy Evaluation ?Patient Details ?Name: Alicia Rogers ?MRN: [id number] ?DOB: 26-Jun-1961 ?Today's Date: 10/02/2021 ? ? ?History of Present Illness 61 yo F with HTN, DM, CKD IIIa baseline ~2, depression and schizophrenia admitted to [facility] 4 days prior to this medical admission due to decompensated schizophrenia, paranoia and noncompliance.       In the ER, EDP unclear who IVC'd the patient.  Patient admitted to noncompliance.  Complained of left arm and hand pain, chronic.  Endorsed some auditory hallucinations.     Was in treatment in [place] for several days, withdrawn per notes, but interactive.  On day prior to admission, she seemed more sluggish to nursing, not taking much PO, and had very flat, unemotional affect, with slow arm movements and some odd arm posturing.  Cr had been ~2, which may be her baseline.  ? ?Clinical Impression ?  ?Pt located in hallway ambulating without assistance. Pt reports living with brother and being independent at baseline with modifications as needed. Pt's appears to have L wrist drop with 0/5 strength noted for wrist extension/flexion. Pt reports her L hand has been like this since sept 2022. Hand appears to be contracted and pt unable to tolerate OT's attempts for digit extension/PROM or retrograde massage. Pt notifying therapist she did not want these exercises done or attempted in the further. Do not believe hand could be stretched to be placed into a resting hand splint at this time. Pt reports, " I just want medicine for this ( L hand)". Pt and staff reports she sleeps on L hand each night. OT discussed positioning in bed, L UE elevation, and how to utilize her R hand to stretcher her L. Pt is not interested further in OT intervention and pt does not appear to need skilled OT services at this time. OT to SIGN OFF. ?   ? ?Recommendations for follow up therapy are one component of a multi-disciplinary discharge planning process, led by the attending  physician.  Recommendations may be updated based on patient status, additional functional criteria and insurance authorization.  ? ?Follow Up Recommendations ? No OT follow up  ?  ?Assistance Recommended at Discharge PRN  ?   ?Functional Status Assessment ? Patient has not had a recent decline in their functional status  ?Equipment Recommendations ? None recommended by OT  ?  ?   ?Precautions / Restrictions Precautions ?Precautions: Fall  ? ?  ? ?Mobility Bed Mobility ?Overal bed mobility: Independent ?  ?  ?  ?  ?  ?  ?  ?  ? ?Transfers ?Overall transfer level: Independent ?  ?  ?  ?  ?  ?  ?  ?  ?  ?  ? ?  ?Balance Overall balance assessment: Independent ?  ?  ?  ?  ?  ?  ?  ?  ?  ?  ?  ?  ?  ?  ?  ?  ?  ?  ?   ? ?ADL either performed or assessed with clinical judgement  ? ?ADL Overall ADL's : Modified independent ?  ?  ?  ?  ?  ?  ?  ?  ?  ?  ?  ?  ?  ?  ?  ?  ?  ?  ?  ?   ? ? ? ?Vision Patient Visual Report: No change from baseline ?   ?   ?   ?   ? ?Pertinent Vitals/Pain Pain Assessment ?Pain Assessment:  Faces ?Faces Pain Scale: Hurts whole lot ?Pain Location: L hand ?Pain Descriptors / Indicators: Discomfort, Grimacing, Guarding ?Pain Intervention(s): Limited activity within patient's tolerance, Monitored during session, Patient requesting pain meds-RN notified  ? ? ? ?Hand Dominance Right ?  ?Extremity/Trunk Assessment Upper Extremity Assessment ?Upper Extremity Assessment: LUE deficits/detail ?LUE Deficits / Details: Pt with noted wrist drop and hand in contracture. She is unable to tolerate extension of digits or retrograde massage this session. ?  ?  ?  ?  ?  ?Communication Communication ?Communication: No difficulties ?  ?Cognition Arousal/Alertness: Awake/alert ?Behavior During Therapy: [facility] for tasks assessed/performed ?  ?  ?  ?  ?  ?  ?  ?  ?  ?  ?  ?  ?  ?  ?  ?  ?  ?General Comments: A & Ox4 ?  ?  ?   ?   ?   ? ? ?Home Living Family/patient expects to be discharged to:: Private residence ?Living  Arrangements: Other relatives (brother) ?Available Help at Discharge: Family ?Type of Home: House ?Home Access: Ramped entrance ?  ?  ?Home Layout: One level ?  ?  ?Bathroom Shower/Tub: Tub/shower unit ?  ?  ?  ?  ?  ?  ?  ?  ? ?  ?Prior Functioning/Environment Prior Level of Function : Independent/Modified Independent ?  ?  ?  ?  ?  ?  ?  ?  ?  ? ?  ?  ?   ?   ?   ?OT Goals(Current goals can be found in the care plan section) Acute Rehab OT Goals ?Patient Stated Goal: to get medicine ?OT Goal Formulation: With patient ?Time For Goal Achievement: 10/02/21 ?Potential to Achieve Goals: Fair  ?OT Frequency:   ?  ? ?   ?AM-PAC OT "6 Clicks" Daily Activity     ?Outcome Measure Help from another person eating meals?: None ?Help from another person taking care of personal grooming?: None ?Help from another person toileting, which includes using toliet, bedpan, or urinal?: None ?Help from another person bathing (including washing, rinsing, drying)?: None ?Help from another person to put on and taking off regular upper body clothing?: None ?Help from another person to put on and taking off regular lower body clothing?: None ?6 Click Score: 24 ?  ?End of Session Nurse Communication: Mobility status ? ?Activity Tolerance: Patient limited by pain;Patient tolerated treatment well ?Patient left: in chair ? ?   ?              ?Time: 9470-9628 ?OT Time Calculation (min): 19 min ?Charges:  OT General Charges ?$OT Visit: 1 Visit ?OT Evaluation ?$OT Eval Low Complexity: 1 Low ?OT Treatments ?$Therapeutic Activity: 8-22 mins ? ?Darleen Crocker, MS, OTR/L , CBIS ?ascom [phone]  ?10/02/21, 4:12 PM  ?

## 2021-10-02 NOTE — Plan of Care (Signed)
Patient presents A&Ox3 with poor insight as to why she is here.  Affect is flat, behavior withdrawn and isolative.  Denies AVH, SI, HI, anxiety or depression.  Patient is med complaint.  Patient wears brief for incontinence.  Gait is slow and steady.  Left hand remains edematous.  Tylenol given and encouraged patient to elevate when possible.  Scheduled Trazodone given for sleep.  Will continue to monitor with Q 15 min safety checks. ? ?Problem: Education: ?Goal: Knowledge of [place] Education information/materials will improve ?Outcome: Progressing ?Goal: Emotional status will improve ?Outcome: Progressing ?Goal: Mental status will improve ?Outcome: Progressing ?Goal: Verbalization of understanding the information provided will improve ?Outcome: Progressing ?  ?

## 2021-10-02 NOTE — Progress Notes (Signed)
Recreation Therapy Notes ? ?INPATIENT RECREATION TR PLAN ? ?Patient Details ?Name: Alicia Rogers ?MRN: [id number] ?DOB: 1960/09/25 ?Today's Date: 10/02/2021 ? ?Rec Therapy Plan ?Is patient appropriate for Therapeutic Recreation?: Yes ?Treatment times per week: at least 3 ?Estimated Length of Stay: 5-7 days ?TR Treatment/Interventions: Group participation (Comment) ? ?Discharge Criteria ?Pt will be discharged from therapy if:: Discharged ?Treatment plan/goals/alternatives discussed and agreed upon by:: Patient/family ? ?Discharge Summary ?  ? ? ?[place] ?10/02/2021, 9:13 AM ?

## 2021-10-02 NOTE — Progress Notes (Signed)
[facility] MD Progress Note  10/02/2021 1:06 PM Alicia Rogers  MRN:  [id number] Subjective: The patient is seen and examined today.  She is alert and oriented.  She has a history of left radial nerve palsy.  I talked to her about occupational therapy possibly working with her.  She states that she is had a brace in the past and for some reason she does not have it anymore.  Her blood pressure is up a little bit today so we will keep an eye on that.  Principal Problem: Schizoaffective disorder, bipolar type ([place]) Diagnosis: Principal Problem:   Schizoaffective disorder, bipolar type ([place])  Total Time spent with patient: 15 minutes  Past Psychiatric History: [place]  Past Medical History:  Past Medical History:  Diagnosis Date   Depression    Diabetes mellitus without complication ([place])    Diabetes mellitus, type II ([place])    Hypertension    Schizophrenia ([place])    History reviewed. No pertinent surgical history. Family History:  Family History  Problem Relation Age of Onset   Diabetes Mother    Glaucoma Mother    Heart disease Mother    Heart attack Mother    Depression Mother    Hypertension Mother    Diabetes Father    Glaucoma Father    Hypertension Father    Diabetes Sister    Depression Sister    Post-traumatic stress disorder Sister     Social History:  Social History   Substance and Sexual Activity  Alcohol Use No   Alcohol/week: 0.0 standard drinks     Social History   Substance and Sexual Activity  Drug Use No    Social History   Socioeconomic History   Marital status: Single    Spouse name: Not on file   Number of children: Not on file   Years of education: Not on file   Highest education level: Not on file  Occupational History   Not on file  Tobacco Use   Smoking status: Every Day    Packs/day: 1.00    Types: Cigarettes    Start date: 04/21/1978   Smokeless tobacco: Never  Vaping Use   Vaping Use: Never used  Substance and  Sexual Activity   Alcohol use: No    Alcohol/week: 0.0 standard drinks   Drug use: No   Sexual activity: Not Currently  Other Topics Concern   Not on file  Social History Narrative   Not on file   Social Determinants of Health   Financial Resource Strain: Not on file  Food Insecurity: Not on file  Transportation Needs: Not on file  Physical Activity: Not on file  Stress: Not on file  Social Connections: Not on file   Additional Social History:                         Sleep: Good  Appetite:  Poor  Current Medications: Current Facility-Administered Medications  Medication Dose Route Frequency Provider Last Rate Last Admin   acetaminophen (TYLENOL) tablet 650 mg  650 mg Oral Q6H PRN Caroline Sauger, NP   650 mg at 10/01/21 2215   alum & mag hydroxide-simeth (MAALOX/MYLANTA) 200-200-20 MG/5ML suspension 30 mL  30 mL Oral Q4H PRN Parks Ranger, DO   30 mL at 10/01/21 2215   amLODipine (NORVASC) tablet 10 mg  10 mg Oral Daily Parks Ranger, DO   10 mg at 10/02/21 0952   escitalopram (LEXAPRO)  tablet 10 mg  10 mg Oral Daily Parks Ranger, DO   10 mg at 10/02/21 5053   hydrALAZINE (APRESOLINE) tablet 50 mg  50 mg Oral TID Parks Ranger, DO   50 mg at 10/02/21 0810   hydrochlorothiazide (HYDRODIURIL) tablet 12.5 mg  12.5 mg Oral Daily Parks Ranger, DO   12.5 mg at 10/02/21 9767   insulin aspart (novoLOG) injection 0-15 Units  0-15 Units Subcutaneous TID WC Parks Ranger, DO   3 Units at 10/02/21 3419   insulin aspart (novoLOG) injection 0-5 Units  0-5 Units Subcutaneous QHS Parks Ranger, DO   2 Units at 10/01/21 2154   ondansetron (ZOFRAN) tablet 4 mg  4 mg Oral Q6H PRN Parks Ranger, DO       Or   ondansetron [facility]) injection 4 mg  4 mg Intravenous Q6H PRN Parks Ranger, DO       paliperidone (INVEGA) 24 hr tablet 6 mg  6 mg Oral Daily Parks Ranger, DO   6 mg at  10/02/21 3790   polyethylene glycol (MIRALAX / GLYCOLAX) packet 17 g  17 g Oral Daily PRN Parks Ranger, DO       traZODone (DESYREL) tablet 100 mg  100 mg Oral QHS Parks Ranger, DO   100 mg at 10/01/21 2155    Lab Results:  Results for orders placed or performed during the hospital encounter of 09/29/21 (from the past 48 hour(s))  Glucose, capillary     Status: Abnormal   Collection Time: 09/30/21  4:22 PM  Result Value Ref Range   Glucose-Capillary 285 (H) 70 - 99 mg/dL    Comment: Glucose reference range applies only to samples taken after fasting for at least 8 hours.  Glucose, capillary     Status: Abnormal   Collection Time: 09/30/21 10:07 PM  Result Value Ref Range   Glucose-Capillary 228 (H) 70 - 99 mg/dL    Comment: Glucose reference range applies only to samples taken after fasting for at least 8 hours.   Comment 1 Notify RN   Glucose, capillary     Status: Abnormal   Collection Time: 10/01/21  8:00 AM  Result Value Ref Range   Glucose-Capillary 208 (H) 70 - 99 mg/dL    Comment: Glucose reference range applies only to samples taken after fasting for at least 8 hours.  Glucose, capillary     Status: Abnormal   Collection Time: 10/01/21 11:47 AM  Result Value Ref Range   Glucose-Capillary 232 (H) 70 - 99 mg/dL    Comment: Glucose reference range applies only to samples taken after fasting for at least 8 hours.  Glucose, capillary     Status: Abnormal   Collection Time: 10/01/21  4:24 PM  Result Value Ref Range   Glucose-Capillary 292 (H) 70 - 99 mg/dL    Comment: Glucose reference range applies only to samples taken after fasting for at least 8 hours.  Glucose, capillary     Status: Abnormal   Collection Time: 10/01/21  9:32 PM  Result Value Ref Range   Glucose-Capillary 223 (H) 70 - 99 mg/dL    Comment: Glucose reference range applies only to samples taken after fasting for at least 8 hours.  Glucose, capillary     Status: Abnormal   Collection  Time: 10/02/21  7:48 AM  Result Value Ref Range   Glucose-Capillary 157 (H) 70 - 99 mg/dL    Comment: Glucose reference range applies  only to samples taken after fasting for at least 8 hours.  Glucose, capillary     Status: Abnormal   Collection Time: 10/02/21 11:19 AM  Result Value Ref Range   Glucose-Capillary 165 (H) 70 - 99 mg/dL    Comment: Glucose reference range applies only to samples taken after fasting for at least 8 hours.    Blood Alcohol level:  Lab Results  Component Value Date   ETH <10 09/20/2021   ETH <10 02/72/5366    Metabolic Disorder Labs: Lab Results  Component Value Date   HGBA1C 8.0 (H) 09/27/2021   MPG 182.9 09/27/2021   MPG 180.03 10/13/2020   No results found for: PROLACTIN Lab Results  Component Value Date   CHOL 267 (H) 10/13/2020   TRIG 180 (H) 10/13/2020   HDL 86 10/13/2020   CHOLHDL 3.1 10/13/2020   VLDL 36 10/13/2020   LDLCALC 145 (H) 10/13/2020   LDLCALC 158 (H) 02/29/2020    Physical Findings: AIMS:  , ,  ,  ,    CIWA:    COWS:     Musculoskeletal: Strength & Muscle Tone: abnormal Gait & Station: unsteady Patient leans: N/A  Psychiatric Specialty Exam:  Presentation  General Appearance: Bizarre; Disheveled  Eye Contact:Fair  Speech:Garbled  Speech Volume:Decreased  Handedness:Right   Mood and Affect  Mood:No data recorded Affect:Flat   Thought Process  Thought Processes:Disorganized  Descriptions of Associations:Loose  Orientation:Partial  Thought Content:Illogical  History of Schizophrenia/Schizoaffective disorder:Yes  Duration of Psychotic Symptoms:Greater than six months  Hallucinations:No data recorded Ideas of Reference:No data recorded Suicidal Thoughts:No data recorded Homicidal Thoughts:No data recorded  Sensorium  Memory:Immediate Poor  Judgment:Impaired  Insight:Lacking   Executive Functions  Concentration:Poor  Attention Span:Poor  Recall:Poor  Fund of  Knowledge:Poor  Language:Poor   Psychomotor Activity  Psychomotor Activity:No data recorded  Assets  Assets:Housing; Social Support   Sleep  Sleep:No data recorded   Physical Exam: Physical Exam Vitals and nursing note reviewed.  Constitutional:      Appearance: Normal appearance. She is normal weight.  Neurological:     General: No focal deficit present.     Mental Status: She is alert and oriented to person, place, and time.  Psychiatric:        Attention and Perception: Attention and perception normal.        Mood and Affect: Mood and affect normal.        Speech: Speech is delayed.        Behavior: Behavior normal. Behavior is cooperative.        Thought Content: Thought content normal.        Cognition and Memory: Cognition is impaired. Memory is impaired.        Judgment: Judgment normal.   Review of Systems  Constitutional: Negative.   HENT: Negative.    Eyes: Negative.   Respiratory: Negative.    Cardiovascular: Negative.   Gastrointestinal: Negative.   Genitourinary: Negative.   Musculoskeletal: Negative.   Skin: Negative.   Neurological: Negative.   Endo/Heme/Allergies: Negative.   Psychiatric/Behavioral:  Positive for depression and memory loss. The patient is nervous/anxious.   Blood pressure (!) 152/60, pulse 72, temperature 98.1 F (36.7 C), temperature source Oral, resp. rate 18, height 5' 2.99" (1.6 m), weight 46.9 kg, SpO2 100 %. Body mass index is 18.34 kg/m.   Treatment Plan Summary: Daily contact with patient to assess and evaluate symptoms and progress in treatment, Medication management, and Plan continue current medications.  Carli Lefevers [employer]win [employer],  DO 10/02/2021, 1:06 PM continue current medications.

## 2021-10-02 NOTE — Progress Notes (Signed)
Recreation Therapy Notes ? ?Date: 10/02/2021  ?  ?Time: 1:30 pm  ?  ?Location: Craft room   ?  ?Behavioral response: Appropriate ?  ?Intervention Topic:  Communication  ?  ?Discussion/Intervention:  ?Group content today was focused on communication. The group defined communication and ways to communicate with others. Individuals stated reason why communication is important and some reasons to communicate with others. Patients expressed if they thought they were good at communicating with others and ways they could improve their communication skills. The group identified important parts of communication and some experiences they have had in the past with communication. The group participated in the intervention where they had a chance to test out their communication skills and identify ways to improve their communication techniques.  ?  ?Clinical Observations/Feedback: ?Patient came to group and was focusing on what peers and staff had to say about communication. Individual was social with peers and staff while participating in the intervention.  ?Vesna Kable LRT/CTRS  ? ? ? ? ? ? ? ?Tela Kotecki ?10/02/2021 2:43 PM ?

## 2021-10-03 DIAGNOSIS — F250 Schizoaffective disorder, bipolar type: Secondary | ICD-10-CM | POA: Diagnosis not present

## 2021-10-03 LAB — GLUCOSE, CAPILLARY
Glucose-Capillary: 170 mg/dL — ABNORMAL HIGH (ref 70–99)
Glucose-Capillary: 184 mg/dL — ABNORMAL HIGH (ref 70–99)
Glucose-Capillary: 339 mg/dL — ABNORMAL HIGH (ref 70–99)
Glucose-Capillary: 122 mg/dL — ABNORMAL HIGH (ref 70–99)

## 2021-10-03 MED ORDER — CYCLOBENZAPRINE HCL 10 MG PO TABS
10.0000 mg | ORAL_TABLET | Freq: Every day | ORAL | Status: DC
  Administered 2021-10-03 – 2021-11-09 (×37): 10 mg via ORAL
  Filled 2021-10-03 (×38): qty 1

## 2021-10-03 NOTE — Group Note (Signed)
LCSW Group Therapy Note ? ?Group Date: 10/03/2021 ?Start Time: 1300 ?End Time: 1400 ? ? ?Type of Therapy and Topic:  Group Therapy - Healthy vs Unhealthy Coping Skills ? ?Participation Level:  Did Not Attend  ? ?Description of Group ?The focus of this group was to determine what unhealthy coping techniques typically are used by group members and what healthy coping techniques would be helpful in coping with various problems. Patients were guided in becoming aware of the differences between healthy and unhealthy coping techniques. Patients were asked to identify 2-3 healthy coping skills they would like to learn to use more effectively. ? ?Therapeutic Goals ?Patients learned that coping is what human beings do all day long to deal with various situations in their lives ?Patients defined and discussed healthy vs unhealthy coping techniques ?Patients identified their preferred coping techniques and identified whether these were healthy or unhealthy ?Patients determined 2-3 healthy coping skills they would like to become more familiar with and use more often. ?Patients provided support and ideas to each other ? ? ?Summary of Patient Progress:  Due to limited staffing, group was not held on the unit.  ? ? ?Therapeutic Modalities ?Cognitive Behavioral Therapy ?Motivational Interviewing ? ?Kenna Gilbert Nimco Bivens, LCSWA ?10/03/2021  3:00 PM   ?

## 2021-10-03 NOTE — Progress Notes (Signed)
[facility] MD Progress Note  10/03/2021 11:05 AM Alicia Rogers  MRN:  [id number] Subjective: Patient is seen and examined today.  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  She is pleasant and cooperative today.  She is taking her medications as prescribed and denies any side effects.  She was incontinent this morning in bed.  Principal Problem: Schizoaffective disorder, bipolar type ([place]) Diagnosis: Principal Problem:   Schizoaffective disorder, bipolar type ([place])  Total Time spent with patient: 15 minutes  Past Psychiatric History: Yes  Past Medical History:  Past Medical History:  Diagnosis Date   Depression    Diabetes mellitus without complication ([place])    Diabetes mellitus, type II ([place])    Hypertension    Schizophrenia ([place])    History reviewed. No pertinent surgical history. Family History:  Family History  Problem Relation Age of Onset   Diabetes Mother    Glaucoma Mother    Heart disease Mother    Heart attack Mother    Depression Mother    Hypertension Mother    Diabetes Father    Glaucoma Father    Hypertension Father    Diabetes Sister    Depression Sister    Post-traumatic stress disorder Sister     Social History:  Social History   Substance and Sexual Activity  Alcohol Use No   Alcohol/week: 0.0 standard drinks     Social History   Substance and Sexual Activity  Drug Use No    Social History   Socioeconomic History   Marital status: Single    Spouse name: Not on file   Number of children: Not on file   Years of education: Not on file   Highest education level: Not on file  Occupational History   Not on file  Tobacco Use   Smoking status: Every Day    Packs/day: 1.00    Types: Cigarettes    Start date: 04/21/1978   Smokeless tobacco: Never  Vaping Use   Vaping Use: Never used  Substance and Sexual Activity   Alcohol use: No     Alcohol/week: 0.0 standard drinks   Drug use: No   Sexual activity: Not Currently  Other Topics Concern   Not on file  Social History Narrative   Not on file   Social Determinants of Health   Financial Resource Strain: Not on file  Food Insecurity: Not on file  Transportation Needs: Not on file  Physical Activity: Not on file  Stress: Not on file  Social Connections: Not on file   Additional Social History:                         Sleep: Fair  Appetite:  Fair  Current Medications: Current Facility-Administered Medications  Medication Dose Route Frequency Provider Last Rate Last Admin   acetaminophen (TYLENOL) tablet 650 mg  650 mg Oral Q6H PRN Caroline Sauger, NP   650 mg at 10/01/21 2215   alum & mag hydroxide-simeth (MAALOX/MYLANTA) 200-200-20 MG/5ML suspension 30 mL  30 mL Oral Q4H PRN Parks Ranger, DO   30 mL at 10/01/21 2215   amLODipine (NORVASC) tablet 10 mg  10 mg Oral Daily Parks Ranger, DO   10 mg at 10/02/21 0174   cyclobenzaprine (FLEXERIL) tablet 10 mg  10 mg Oral QHS Parks Ranger, DO  escitalopram (LEXAPRO) tablet 10 mg  10 mg Oral Daily Parks Ranger, DO   10 mg at 10/03/21 1008   hydrALAZINE (APRESOLINE) tablet 50 mg  50 mg Oral TID Parks Ranger, DO   50 mg at 10/03/21 0737   hydrochlorothiazide (HYDRODIURIL) tablet 12.5 mg  12.5 mg Oral Daily Parks Ranger, DO   12.5 mg at 10/03/21 1008   insulin aspart (novoLOG) injection 0-15 Units  0-15 Units Subcutaneous TID WC Parks Ranger, DO   3 Units at 10/03/21 1062   insulin aspart (novoLOG) injection 0-5 Units  0-5 Units Subcutaneous QHS Parks Ranger, DO   3 Units at 10/02/21 2131   ondansetron (ZOFRAN) tablet 4 mg  4 mg Oral Q6H PRN Parks Ranger, DO       Or   ondansetron [facility]) injection 4 mg  4 mg Intravenous Q6H PRN Parks Ranger, DO       paliperidone (INVEGA) 24 hr tablet 6 mg  6 mg  Oral Daily Parks Ranger, DO   6 mg at 10/03/21 1008   polyethylene glycol (MIRALAX / GLYCOLAX) packet 17 g  17 g Oral Daily PRN Parks Ranger, DO       traZODone (DESYREL) tablet 100 mg  100 mg Oral QHS Parks Ranger, DO   100 mg at 10/02/21 2137    Lab Results:  Results for orders placed or performed during the hospital encounter of 09/29/21 (from the past 48 hour(s))  Glucose, capillary     Status: Abnormal   Collection Time: 10/01/21 11:47 AM  Result Value Ref Range   Glucose-Capillary 232 (H) 70 - 99 mg/dL    Comment: Glucose reference range applies only to samples taken after fasting for at least 8 hours.  Glucose, capillary     Status: Abnormal   Collection Time: 10/01/21  4:24 PM  Result Value Ref Range   Glucose-Capillary 292 (H) 70 - 99 mg/dL    Comment: Glucose reference range applies only to samples taken after fasting for at least 8 hours.  Glucose, capillary     Status: Abnormal   Collection Time: 10/01/21  9:32 PM  Result Value Ref Range   Glucose-Capillary 223 (H) 70 - 99 mg/dL    Comment: Glucose reference range applies only to samples taken after fasting for at least 8 hours.  Glucose, capillary     Status: Abnormal   Collection Time: 10/02/21  7:48 AM  Result Value Ref Range   Glucose-Capillary 157 (H) 70 - 99 mg/dL    Comment: Glucose reference range applies only to samples taken after fasting for at least 8 hours.  Glucose, capillary     Status: Abnormal   Collection Time: 10/02/21 11:19 AM  Result Value Ref Range   Glucose-Capillary 165 (H) 70 - 99 mg/dL    Comment: Glucose reference range applies only to samples taken after fasting for at least 8 hours.  Glucose, capillary     Status: Abnormal   Collection Time: 10/02/21  3:50 PM  Result Value Ref Range   Glucose-Capillary 224 (H) 70 - 99 mg/dL    Comment: Glucose reference range applies only to samples taken after fasting for at least 8 hours.  Glucose, capillary     Status:  Abnormal   Collection Time: 10/02/21  7:50 PM  Result Value Ref Range   Glucose-Capillary 217 (H) 70 - 99 mg/dL    Comment: Glucose reference range applies only to samples taken after  fasting for at least 8 hours.  Glucose, capillary     Status: Abnormal   Collection Time: 10/02/21  9:27 PM  Result Value Ref Range   Glucose-Capillary 262 (H) 70 - 99 mg/dL    Comment: Glucose reference range applies only to samples taken after fasting for at least 8 hours.  Glucose, capillary     Status: Abnormal   Collection Time: 10/03/21  7:40 AM  Result Value Ref Range   Glucose-Capillary 170 (H) 70 - 99 mg/dL    Comment: Glucose reference range applies only to samples taken after fasting for at least 8 hours.    Blood Alcohol level:  Lab Results  Component Value Date   ETH <10 09/20/2021   ETH <10 73/73/6681    Metabolic Disorder Labs: Lab Results  Component Value Date   HGBA1C 8.0 (H) 09/27/2021   MPG 182.9 09/27/2021   MPG 180.03 10/13/2020   No results found for: PROLACTIN Lab Results  Component Value Date   CHOL 267 (H) 10/13/2020   TRIG 180 (H) 10/13/2020   HDL 86 10/13/2020   CHOLHDL 3.1 10/13/2020   VLDL 36 10/13/2020   LDLCALC 145 (H) 10/13/2020   LDLCALC 158 (H) 02/29/2020    Physical Findings: AIMS:  , ,  ,  ,    CIWA:    COWS:     Musculoskeletal: Strength & Muscle Tone: within normal limits Gait & Station: normal Patient leans: N/A  Psychiatric Specialty Exam:  Presentation  General Appearance: Bizarre; Disheveled  Eye Contact:Fair  Speech:Garbled  Speech Volume:Decreased  Handedness:Right   Mood and Affect  Mood:No data recorded Affect:Flat   Thought Process  Thought Processes:Disorganized  Descriptions of Associations:Loose  Orientation:Partial  Thought Content:Illogical  History of Schizophrenia/Schizoaffective disorder:Yes  Duration of Psychotic Symptoms:Greater than six months  Hallucinations:No data recorded Ideas of  Reference:No data recorded Suicidal Thoughts:No data recorded Homicidal Thoughts:No data recorded  Sensorium  Memory:Immediate Poor  Judgment:Impaired  Insight:Lacking   Executive Functions  Concentration:Poor  Attention Span:Poor  Recall:Poor  Fund of Knowledge:Poor  Language:Poor   Psychomotor Activity  Psychomotor Activity:No data recorded  Assets  Assets:Housing; Social Support   Sleep  Sleep:No data recorded   Physical Exam: Physical Exam Vitals and nursing note reviewed.  Constitutional:      Appearance: Normal appearance. She is normal weight.  Neurological:     General: No focal deficit present.     Mental Status: She is alert and oriented to person, place, and time.  Psychiatric:        Attention and Perception: Attention normal. She perceives auditory hallucinations.        Mood and Affect: Mood normal. Affect is flat.        Speech: Speech is delayed.        Behavior: Behavior is withdrawn. Behavior is cooperative.        Thought Content: Thought content normal.        Cognition and Memory: Cognition is impaired. Memory is impaired.        Judgment: Judgment is inappropriate.   Review of Systems  Constitutional: Negative.   HENT: Negative.    Eyes: Negative.   Respiratory: Negative.    Cardiovascular: Negative.   Gastrointestinal: Negative.   Genitourinary: Negative.   Musculoskeletal: Negative.   Skin: Negative.   Neurological: Negative.   Endo/Heme/Allergies: Negative.   Psychiatric/Behavioral:  Positive for hallucinations and memory loss.   Blood pressure (!) 134/47, pulse 67, temperature 98.2 F (36.8 C), resp. rate 20,  height 5' 2.99" (1.6 m), weight 46.9 kg, SpO2 100 %. Body mass index is 18.34 kg/m.   Treatment Plan Summary: Daily contact with patient to assess and evaluate symptoms and progress in treatment, Medication management, and Plan start Flexeril 10 mg at bedtime.  [place], DO 10/03/2021, 11:05 AM

## 2021-10-03 NOTE — Plan of Care (Signed)
Patient remains alert and oriented with flat affect and delayed speech. Patient had an incontinent episode of urine this am. Patient bed and scrub observed to be soak. Patient assisted with incontinent care and shower., clean and dry scrub provided. Complete linen change. Patient gait remain slow and unsteady. Left hand remain edematous. Patient encourage to ask for help when needed and the importance not to stay in wet clothing. Patient continue to require assistance with ADL's. During assessment patient denies pain or discomfort. Denies SI, HI, and AVH.  Compliant with all due medications. Ate breakfast in the day room among staff and peers with good appetite. Remain safe on the unit with Q 15 minute safety checks. ? ?Problem: Education: ?Goal: Knowledge of [place] [facility] Education information/materials will improve ?Outcome: Progressing ?Goal: Emotional status will improve ?Outcome: Progressing ?Goal: Mental status will improve ?Outcome: Progressing ?Goal: Verbalization of understanding the information provided will improve ?Outcome: Progressing ?  ?Problem: Activity: ?Goal: Interest or engagement in activities will improve ?Outcome: Progressing ?Goal: Sleeping patterns will improve ?Outcome: Progressing ?  ?Problem: Coping: ?Goal: Ability to verbalize frustrations and anger appropriately will improve ?Outcome: Progressing ?Goal: Ability to demonstrate self-control will improve ?Outcome: Progressing ?  ?Problem: Health Behavior/Discharge Planning: ?Goal: Identification of resources available to assist in meeting health care needs will improve ?Outcome: Progressing ?Goal: Compliance with treatment plan for underlying cause of condition will improve ?Outcome: Progressing ?  ?Problem: Physical Regulation: ?Goal: Ability to maintain clinical measurements within normal limits will improve ?Outcome: Progressing ?  ?Problem: Safety: ?Goal: Periods of time without injury will increase ?Outcome: Progressing ?  ?

## 2021-10-04 DIAGNOSIS — F250 Schizoaffective disorder, bipolar type: Secondary | ICD-10-CM | POA: Diagnosis not present

## 2021-10-04 LAB — GLUCOSE, CAPILLARY
Glucose-Capillary: 175 mg/dL — ABNORMAL HIGH (ref 70–99)
Glucose-Capillary: 206 mg/dL — ABNORMAL HIGH (ref 70–99)
Glucose-Capillary: 159 mg/dL — ABNORMAL HIGH (ref 70–99)
Glucose-Capillary: 148 mg/dL — ABNORMAL HIGH (ref 70–99)

## 2021-10-04 LAB — CREATININE, SERUM
Creatinine, Ser: 2.34 mg/dL — ABNORMAL HIGH (ref 0.44–1.00)
GFR, Estimated: 23 mL/min — ABNORMAL LOW (ref >60)

## 2021-10-04 MED ORDER — HYDROCHLOROTHIAZIDE 12.5 MG PO TABS
12.5000 mg | ORAL_TABLET | Freq: Once | ORAL | Status: AC
  Administered 2021-10-04: 12.5 mg via ORAL
  Filled 2021-10-04: qty 1

## 2021-10-04 MED ORDER — HYDROCHLOROTHIAZIDE 25 MG PO TABS
25.0000 mg | ORAL_TABLET | Freq: Every day | ORAL | Status: DC
  Administered 2021-10-05 – 2021-10-23 (×19): 25 mg via ORAL
  Filled 2021-10-04 (×19): qty 1

## 2021-10-04 NOTE — Plan of Care (Signed)
Patient remain alert and oriented X3 with soft and delayed speech. Continue to require assistance with ADL's. Ambulatory with slow and unsteady gait. Denies pain or discomfort at this time. Denise depression and anxiety. Denies SI, HI, and AVH. Patient continue with incontinent episode, assisted with shower, clean gown provided. Complete linen change. Patient encouraged to ask for assistance. Ate breakfast in the day room with good appetite. Patient is currently sitting in the day room among staff and peers in no apparent distress. Q 15 minute safety checks continue.  ? ?Problem: Education: ?Goal: Knowledge of [place] [facility] Education information/materials will improve ?Outcome: Progressing ?Goal: Emotional status will improve ?Outcome: Progressing ?Goal: Mental status will improve ?Outcome: Progressing ?Goal: Verbalization of understanding the information provided will improve ?Outcome: Progressing ?  ?Problem: Activity: ?Goal: Interest or engagement in activities will improve ?Outcome: Progressing ?Goal: Sleeping patterns will improve ?Outcome: Progressing ?  ?Problem: Coping: ?Goal: Ability to verbalize frustrations and anger appropriately will improve ?Outcome: Progressing ?Goal: Ability to demonstrate self-control will improve ?Outcome: Progressing ?  ?Problem: Health Behavior/Discharge Planning: ?Goal: Identification of resources available to assist in meeting health care needs will improve ?Outcome: Progressing ?Goal: Compliance with treatment plan for underlying cause of condition will improve ?Outcome: Progressing ?  ?Problem: Physical Regulation: ?Goal: Ability to maintain clinical measurements within normal limits will improve ?Outcome: Progressing ?  ?Problem: Safety: ?Goal: Periods of time without injury will increase ?Outcome: Progressing ?  ?

## 2021-10-04 NOTE — Group Note (Signed)
[place] LCSW Group Therapy Note ? ? ?Group Date: 10/04/2021 ?Start Time: 1315 ?End Time: 0867 ? ? ?Type of Therapy and Topic: Group Therapy: Avoiding Self-Sabotaging and Enabling Behaviors ? ?Participation Level: Minimal ? ?Mood: Euthymic ? ?Description of Group:  ?In this group, patients will learn how to identify obstacles, self-sabotaging and enabling behaviors, as well as: what are they, why do we do them and what needs these behaviors meet. Discuss unhealthy relationships and how to have positive healthy boundaries with those that sabotage and enable. Explore aspects of self-sabotage and enabling in yourself and how to limit these self-destructive behaviors in everyday life. ? ? ?Therapeutic Goals: ?1. Patient will identify one obstacle that relates to self-sabotage and enabling behaviors ?2. Patient will identify one personal self-sabotaging or enabling behavior they did prior to admission ?3. Patient will state a plan to change the above identified behavior ?4. Patient will demonstrate ability to communicate their needs through discussion and/or role play.  ? ? ?Summary of Patient Progress: Patient arrived on time and was present for the duration of group. Patient participated in the icebreaker activity and shared when prompted. Patient appeared to actively listen to other group members. Patient stated that she does not have many friends but her sister and nephew are supportive. Patient was not able to identify self-sabotaging behaviors.  ? ? ? ? ?Therapeutic Modalities:  ?Cognitive Behavioral Therapy ?Person-Centered Therapy ?Motivational Interviewing ? ? ? ?Kenna Gilbert Evelena Masci, LCSWA ?

## 2021-10-04 NOTE — Progress Notes (Signed)
Patient denies SI, HI, AVH, or pain.  She spent a good part of the night in the dayroom watching TV.   She didn't interact with the others in the dayroom.  She had a flat affect and soft delayed speech. She was medication compliant tonight. Patient  remains safe on unit with Q 15 min safety checks.  ?

## 2021-10-04 NOTE — Progress Notes (Signed)
[facility] MD Progress Note  10/04/2021 11:54 AM BRYONA FOXWORTHY  MRN:  [id number] Subjective: Baldo Ash states that she is doing well.  She says that she slept well and she feels good.  Her blood pressure is starting to creep up so we will make some changes.  Principal Problem: Schizoaffective disorder, bipolar type ([place]) Diagnosis: Principal Problem:   Schizoaffective disorder, bipolar type ([place])  Total Time spent with patient: 15 minutes  Past Psychiatric History: Yes  Past Medical History:  Past Medical History:  Diagnosis Date   Depression    Diabetes mellitus without complication ([place])    Diabetes mellitus, type II ([place])    Hypertension    Schizophrenia ([place])    History reviewed. No pertinent surgical history. Family History:  Family History  Problem Relation Age of Onset   Diabetes Mother    Glaucoma Mother    Heart disease Mother    Heart attack Mother    Depression Mother    Hypertension Mother    Diabetes Father    Glaucoma Father    Hypertension Father    Diabetes Sister    Depression Sister    Post-traumatic stress disorder Sister     Social History:  Social History   Substance and Sexual Activity  Alcohol Use No   Alcohol/week: 0.0 standard drinks     Social History   Substance and Sexual Activity  Drug Use No    Social History   Socioeconomic History   Marital status: Single    Spouse name: Not on file   Number of children: Not on file   Years of education: Not on file   Highest education level: Not on file  Occupational History   Not on file  Tobacco Use   Smoking status: Every Day    Packs/day: 1.00    Types: Cigarettes    Start date: 04/21/1978   Smokeless tobacco: Never  Vaping Use   Vaping Use: Never used  Substance and Sexual Activity   Alcohol use: No    Alcohol/week: 0.0 standard drinks   Drug use: No   Sexual activity: Not Currently  Other Topics Concern   Not on file  Social History Narrative   Not on file   Social  Determinants of Health   Financial Resource Strain: Not on file  Food Insecurity: Not on file  Transportation Needs: Not on file  Physical Activity: Not on file  Stress: Not on file  Social Connections: Not on file   Additional Social History:                         Sleep: Fair  Appetite:  Fair  Current Medications: Current Facility-Administered Medications  Medication Dose Route Frequency Provider Last Rate Last Admin   acetaminophen (TYLENOL) tablet 650 mg  650 mg Oral Q6H PRN Caroline Sauger, NP   650 mg at 10/01/21 2215   alum & mag hydroxide-simeth (MAALOX/MYLANTA) 200-200-20 MG/5ML suspension 30 mL  30 mL Oral Q4H PRN Parks Ranger, DO   30 mL at 10/01/21 2215   amLODipine (NORVASC) tablet 10 mg  10 mg Oral Daily Parks Ranger, DO   10 mg at 10/04/21 1009   cyclobenzaprine (FLEXERIL) tablet 10 mg  10 mg Oral QHS Parks Ranger, DO   10 mg at 10/03/21 2127   escitalopram (LEXAPRO) tablet 10 mg  10 mg Oral Daily Parks Ranger, DO   10 mg at 10/04/21 1009  hydrALAZINE (APRESOLINE) tablet 50 mg  50 mg Oral TID Parks Ranger, DO   50 mg at 10/04/21 7124   hydrochlorothiazide (HYDRODIURIL) tablet 12.5 mg  12.5 mg Oral Daily Parks Ranger, DO   12.5 mg at 10/04/21 1009   insulin aspart (novoLOG) injection 0-15 Units  0-15 Units Subcutaneous TID WC Parks Ranger, DO   3 Units at 10/04/21 0815   insulin aspart (novoLOG) injection 0-5 Units  0-5 Units Subcutaneous QHS Parks Ranger, DO   3 Units at 10/02/21 2131   ondansetron (ZOFRAN) tablet 4 mg  4 mg Oral Q6H PRN Parks Ranger, DO       Or   ondansetron [facility]) injection 4 mg  4 mg Intravenous Q6H PRN Parks Ranger, DO       paliperidone (INVEGA) 24 hr tablet 6 mg  6 mg Oral Daily Parks Ranger, DO   6 mg at 10/04/21 1009   polyethylene glycol (MIRALAX / GLYCOLAX) packet 17 g  17 g Oral Daily PRN Parks Ranger, DO       traZODone (DESYREL) tablet 100 mg  100 mg Oral QHS Parks Ranger, DO   100 mg at 10/03/21 2127    Lab Results:  Results for orders placed or performed during the hospital encounter of 09/29/21 (from the past 48 hour(s))  Glucose, capillary     Status: Abnormal   Collection Time: 10/02/21  3:50 PM  Result Value Ref Range   Glucose-Capillary 224 (H) 70 - 99 mg/dL    Comment: Glucose reference range applies only to samples taken after fasting for at least 8 hours.  Glucose, capillary     Status: Abnormal   Collection Time: 10/02/21  7:50 PM  Result Value Ref Range   Glucose-Capillary 217 (H) 70 - 99 mg/dL    Comment: Glucose reference range applies only to samples taken after fasting for at least 8 hours.  Glucose, capillary     Status: Abnormal   Collection Time: 10/02/21  9:27 PM  Result Value Ref Range   Glucose-Capillary 262 (H) 70 - 99 mg/dL    Comment: Glucose reference range applies only to samples taken after fasting for at least 8 hours.  Glucose, capillary     Status: Abnormal   Collection Time: 10/03/21  7:40 AM  Result Value Ref Range   Glucose-Capillary 170 (H) 70 - 99 mg/dL    Comment: Glucose reference range applies only to samples taken after fasting for at least 8 hours.  Glucose, capillary     Status: Abnormal   Collection Time: 10/03/21 11:41 AM  Result Value Ref Range   Glucose-Capillary 184 (H) 70 - 99 mg/dL    Comment: Glucose reference range applies only to samples taken after fasting for at least 8 hours.  Glucose, capillary     Status: Abnormal   Collection Time: 10/03/21  4:31 PM  Result Value Ref Range   Glucose-Capillary 339 (H) 70 - 99 mg/dL    Comment: Glucose reference range applies only to samples taken after fasting for at least 8 hours.  Glucose, capillary     Status: Abnormal   Collection Time: 10/03/21  7:38 PM  Result Value Ref Range   Glucose-Capillary 122 (H) 70 - 99 mg/dL    Comment: Glucose reference  range applies only to samples taken after fasting for at least 8 hours.  Creatinine, serum     Status: Abnormal   Collection Time: 10/04/21  6:31  AM  Result Value Ref Range   Creatinine, Ser 2.34 (H) 0.44 - 1.00 mg/dL   GFR, Estimated 23 (L) >60 mL/min    Comment: (NOTE) Calculated using the CKD-EPI Creatinine Equation (2021) Performed at [facility], [address] [place], [place] [address]   Glucose, capillary     Status: Abnormal   Collection Time: 10/04/21  7:46 AM  Result Value Ref Range   Glucose-Capillary 175 (H) 70 - 99 mg/dL    Comment: Glucose reference range applies only to samples taken after fasting for at least 8 hours.  Glucose, capillary     Status: Abnormal   Collection Time: 10/04/21 11:43 AM  Result Value Ref Range   Glucose-Capillary 206 (H) 70 - 99 mg/dL    Comment: Glucose reference range applies only to samples taken after fasting for at least 8 hours.    Blood Alcohol level:  Lab Results  Component Value Date   ETH <10 09/20/2021   ETH <10 84/16/6063    Metabolic Disorder Labs: Lab Results  Component Value Date   HGBA1C 8.0 (H) 09/27/2021   MPG 182.9 09/27/2021   MPG 180.03 10/13/2020   No results found for: PROLACTIN Lab Results  Component Value Date   CHOL 267 (H) 10/13/2020   TRIG 180 (H) 10/13/2020   HDL 86 10/13/2020   CHOLHDL 3.1 10/13/2020   VLDL 36 10/13/2020   LDLCALC 145 (H) 10/13/2020   LDLCALC 158 (H) 02/29/2020    Physical Findings: AIMS:  , ,  ,  ,    CIWA:    COWS:     Musculoskeletal: Strength & Muscle Tone: within normal limits Gait & Station: normal Patient leans: N/A  Psychiatric Specialty Exam:  Presentation  General Appearance: Bizarre; Disheveled  Eye Contact:Fair  Speech:Garbled  Speech Volume:Decreased  Handedness:Right   Mood and Affect  Mood:No data recorded Affect:Flat   Thought Process  Thought Processes:Disorganized  Descriptions of  Associations:Loose  Orientation:Partial  Thought Content:Illogical  History of Schizophrenia/Schizoaffective disorder:Yes  Duration of Psychotic Symptoms:Greater than six months  Hallucinations:No data recorded Ideas of Reference:No data recorded Suicidal Thoughts:No data recorded Homicidal Thoughts:No data recorded  Sensorium  Memory:Immediate Poor  Judgment:Impaired  Insight:Lacking   Executive Functions  Concentration:Poor  Attention Span:Poor  Recall:Poor  Fund of Knowledge:Poor  Language:Poor   Psychomotor Activity  Psychomotor Activity:No data recorded  Assets  Assets:Housing; Social Support   Sleep  Sleep:No data recorded   Physical Exam: Physical Exam Vitals and nursing note reviewed.  Constitutional:      Appearance: Normal appearance. She is normal weight.  Neurological:     General: No focal deficit present.     Mental Status: She is alert and oriented to person, place, and time.  Psychiatric:        Mood and Affect: Mood normal.        Behavior: Behavior normal.   Review of Systems  Constitutional: Negative.   HENT: Negative.    Eyes: Negative.   Respiratory: Negative.    Cardiovascular: Negative.   Gastrointestinal: Negative.   Genitourinary: Negative.   Musculoskeletal: Negative.   Skin: Negative.   Neurological: Negative.   Endo/Heme/Allergies: Negative.   Psychiatric/Behavioral: Negative.    Blood pressure (!) 140/51, pulse 62, temperature 98.2 F (36.8 C), temperature source Oral, resp. rate 18, height 5' 2.99" (1.6 m), weight 46.9 kg, SpO2 100 %. Body mass index is 18.34 kg/m.   Treatment Plan Summary: Daily contact with patient to assess and evaluate symptoms and progress  in treatment, Medication management, and Plan increase hydrochlorothiazide to 25 mg/day.  Parks Ranger, DO 10/04/2021, 11:54 AM

## 2021-10-05 DIAGNOSIS — F250 Schizoaffective disorder, bipolar type: Secondary | ICD-10-CM | POA: Diagnosis not present

## 2021-10-05 LAB — GLUCOSE, CAPILLARY
Glucose-Capillary: 181 mg/dL — ABNORMAL HIGH (ref 70–99)
Glucose-Capillary: 242 mg/dL — ABNORMAL HIGH (ref 70–99)
Glucose-Capillary: 175 mg/dL — ABNORMAL HIGH (ref 70–99)
Glucose-Capillary: 189 mg/dL — ABNORMAL HIGH (ref 70–99)

## 2021-10-05 NOTE — BH IP Treatment Plan (Signed)
Interdisciplinary Treatment and Diagnostic Plan Update  10/05/2021 Time of Session: 10:00AM Alicia Rogers MRN: [id number]  Principal Diagnosis: Schizoaffective disorder, bipolar type ([place])  Secondary Diagnoses: Principal Problem:   Schizoaffective disorder, bipolar type ([place])   Current Medications:  Current Facility-Administered Medications  Medication Dose Route Frequency Provider Last Rate Last Admin   acetaminophen (TYLENOL) tablet 650 mg  650 mg Oral Q6H PRN Caroline Sauger, NP   650 mg at 10/04/21 2157   alum & mag hydroxide-simeth (MAALOX/MYLANTA) 200-200-20 MG/5ML suspension 30 mL  30 mL Oral Q4H PRN Parks Ranger, DO   30 mL at 10/01/21 2215   amLODipine (NORVASC) tablet 10 mg  10 mg Oral Daily Parks Ranger, DO   10 mg at 10/05/21 4656   cyclobenzaprine (FLEXERIL) tablet 10 mg  10 mg Oral QHS Parks Ranger, DO   10 mg at 10/04/21 2157   escitalopram (LEXAPRO) tablet 10 mg  10 mg Oral Daily Parks Ranger, DO   10 mg at 10/05/21 8127   hydrALAZINE (APRESOLINE) tablet 50 mg  50 mg Oral TID Parks Ranger, DO   50 mg at 10/05/21 0847   hydrochlorothiazide (HYDRODIURIL) tablet 25 mg  25 mg Oral Daily Parks Ranger, DO   25 mg at 10/05/21 0950   insulin aspart (novoLOG) injection 0-15 Units  0-15 Units Subcutaneous TID WC Parks Ranger, DO   3 Units at 10/05/21 0846   insulin aspart (novoLOG) injection 0-5 Units  0-5 Units Subcutaneous QHS Parks Ranger, DO   3 Units at 10/02/21 2131   ondansetron (ZOFRAN) tablet 4 mg  4 mg Oral Q6H PRN Parks Ranger, DO       Or   ondansetron [facility]) injection 4 mg  4 mg Intravenous Q6H PRN Parks Ranger, DO       paliperidone (INVEGA) 24 hr tablet 6 mg  6 mg Oral Daily Parks Ranger, DO   6 mg at 10/05/21 0950   polyethylene glycol (MIRALAX / GLYCOLAX) packet 17 g  17 g Oral Daily PRN Parks Ranger, DO       traZODone  (DESYREL) tablet 100 mg  100 mg Oral QHS Parks Ranger, DO   100 mg at 10/04/21 2157   PTA Medications: Medications Prior to Admission  Medication Sig Dispense Refill Last Dose   amLODipine (NORVASC) 10 MG tablet Take 1 tablet (10 mg total) by mouth daily.      [EXPIRED] cefdinir (OMNICEF) 300 MG capsule Take 1 capsule (300 mg total) by mouth daily for 3 days. 3 capsule 0    hydrALAZINE (APRESOLINE) 50 MG tablet Take 1 tablet (50 mg total) by mouth 3 (three) times daily.      hydrochlorothiazide (HYDRODIURIL) 12.5 MG tablet Take 1 tablet (12.5 mg total) by mouth daily.       Patient Stressors: Health problems    Patient Strengths: Religious Affiliation   Treatment Modalities: Medication Management, Group therapy, Case management,  1 to 1 session with clinician, Psychoeducation, Recreational therapy.   Physician Treatment Plan for Primary Diagnosis: Schizoaffective disorder, bipolar type ([place]) Long Term Goal(s): Improvement in symptoms so as ready for discharge   Short Term Goals: Ability to identify changes in lifestyle to reduce recurrence of condition will improve Ability to verbalize feelings will improve Ability to disclose and discuss suicidal ideas Ability to demonstrate self-control will improve Ability to identify and develop effective coping behaviors will improve Ability to maintain clinical measurements within normal  limits will improve Compliance with prescribed medications will improve Ability to identify triggers associated with substance abuse/mental health issues will improve  Medication Management: Evaluate patient's response, side effects, and tolerance of medication regimen.  Therapeutic Interventions: 1 to 1 sessions, Unit Group sessions and Medication administration.  Evaluation of Outcomes: Progressing  Physician Treatment Plan for Secondary Diagnosis: Principal Problem:   Schizoaffective disorder, bipolar type ([place])  Long Term Goal(s):  Improvement in symptoms so as ready for discharge   Short Term Goals: Ability to identify changes in lifestyle to reduce recurrence of condition will improve Ability to verbalize feelings will improve Ability to disclose and discuss suicidal ideas Ability to demonstrate self-control will improve Ability to identify and develop effective coping behaviors will improve Ability to maintain clinical measurements within normal limits will improve Compliance with prescribed medications will improve Ability to identify triggers associated with substance abuse/mental health issues will improve     Medication Management: Evaluate patient's response, side effects, and tolerance of medication regimen.  Therapeutic Interventions: 1 to 1 sessions, Unit Group sessions and Medication administration.  Evaluation of Outcomes: Progressing   RN Treatment Plan for Primary Diagnosis: Schizoaffective disorder, bipolar type ([place]) Long Term Goal(s): Knowledge of disease and therapeutic regimen to maintain health will improve  Short Term Goals: Ability to remain free from injury will improve, Ability to verbalize frustration and anger appropriately will improve, Ability to demonstrate self-control, Ability to participate in decision making will improve, Ability to verbalize feelings will improve, Ability to identify and develop effective coping behaviors will improve, and Compliance with prescribed medications will improve  Medication Management: RN will administer medications as ordered by provider, will assess and evaluate patient's response and provide education to patient for prescribed medication. RN will report any adverse and/or side effects to prescribing provider.  Therapeutic Interventions: 1 on 1 counseling sessions, Psychoeducation, Medication administration, Evaluate responses to treatment, Monitor vital signs and CBGs as ordered, Perform/monitor CIWA, COWS, AIMS and Fall Risk screenings as ordered,  Perform wound care treatments as ordered.  Evaluation of Outcomes: Progressing   LCSW Treatment Plan for Primary Diagnosis: Schizoaffective disorder, bipolar type ([place]) Long Term Goal(s): Safe transition to appropriate next level of care at discharge, Engage patient in therapeutic group addressing interpersonal concerns.  Short Term Goals: Engage patient in aftercare planning with referrals and resources, Increase social support, Increase ability to appropriately verbalize feelings, Increase emotional regulation, Facilitate acceptance of mental health diagnosis and concerns, Identify triggers associated with mental health/substance abuse issues, and Increase skills for wellness and recovery  Therapeutic Interventions: Assess for all discharge needs, 1 to 1 time with Social worker, Explore available resources and support systems, Assess for adequacy in community support network, Educate family and significant other(s) on suicide prevention, Complete Psychosocial Assessment, Interpersonal group therapy.  Evaluation of Outcomes: Progressing   Progress in Treatment: Attending groups: Yes. Participating in groups: Yes. Taking medication as prescribed: Yes. Toleration medication: Yes. Family/Significant other contact made: Yes, individual(s) contacted:  SPE completed with [name], sister Patient understands diagnosis: No. Discussing patient identified problems/goals with staff: Yes. Medical problems stabilized or resolved: Yes. Denies suicidal/homicidal ideation: Yes. Issues/concerns per patient self-inventory: No. Other: None  New problem(s) identified: No, Describe:  None  New Short Term/Long Term Goal(s): Patient to work towards elimination of symptoms of psychosis, medication management for mood stabilization;  development of comprehensive mental wellness plan. Update 10/05/21: No changes at this time.      Patient Goals:  "to get better and taking my  medication" Update 10/05/21: No  changes at this time.    Discharge Plan or Barriers: CSW will assist pt with development of appropriate discharge/aftercare plan. Update 10/05/21: CSW discussed housing options, CSW will send referrals to family care homes per pt's interest. Pt will follow up with family regarding ability to return back home.    Reason for Continuation of Hospitalization: Hallucinations Medication stabilization   Estimated Length of Stay: TBD   Scribe for Treatment Team: Juletta Berhe A [place], [place] 10/05/2021 11:20 AM

## 2021-10-05 NOTE — NC FL2 (Addendum)
?[place] MEDICAID FL2 LEVEL OF CARE SCREENING TOOL  ?  ? ?IDENTIFICATION  ?Patient Name: ?Alicia Rogers Birthdate: [birth year] Sex: female Admission Date (Current Location): ?09/29/2021  ?[place] and [place] Number: ? [place] ?  Facility and Address:  ?[facility], [address], [place], [place] [address] ?     Provider Number: ?[id number]  ?Attending Physician Name and Address:  ?Parks Ranger,* ? Relative Name and Phone Number:  ?Hetvi Shawhan (Sister)   [phone] ?   ?Current Level of Care: ?Hospital Recommended Level of Care: ?[place] Prior Approval Number: ?  ? ?Date Approved/Denied: ?  PASRR Number: ?  ? ?Discharge Plan: ?Other (Comment) [facility] [place] Nursing home ?  ? ?Current Diagnoses: ?Patient Active Problem List  ? Diagnosis Date Noted  ? Schizoaffective disorder, bipolar type ([place]) 09/29/2021  ? Hypertensive urgency 09/28/2021  ? Acute metabolic encephalopathy 78/46/9629  ? Left lower lobe pneumonia 09/27/2021  ? Sinus bradycardia 09/27/2021  ? Anemia in chronic kidney disease (CKD) 09/27/2021  ? Left arm swelling 09/27/2021  ? 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  ? Essential hypertension 02/28/2020  ? Back pain 02/28/2020  ? CKD (chronic kidney disease), stage IV ([place]) 02/28/2020  ? Hematuria 02/28/2020  ? HLD (hyperlipidemia) 02/28/2020  ? Compulsive tobacco user syndrome 10/11/2014  ? 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  ? Claudication ([place]) 06/09/2014  ? Schizoaffective disorder, depressive type ([place]) 06/09/2014  ? Controlled type 2 diabetes mellitus without complication ([place]) 52/84/1324  ? ? ?Orientation RESPIRATION BLADDER Height & Weight   ?  ?Self ? Normal Incontinent Weight: 103 lb 8 oz (46.9 kg) ?Height:  5' 2.99" (160 cm)  ?BEHAVIORAL SYMPTOMS/MOOD NEUROLOGICAL BOWEL NUTRITION STATUS  ?   (N/A) Incontinent Diet (diabetic diet)  ?AMBULATORY STATUS COMMUNICATION OF NEEDS Skin   ?Independent Verbally   ?  ?   ?  ?    ?     ?     ? ? ?Personal Care Assistance Level of Assistance  ?Bathing Bathing Assistance: Limited assistance ?  ?  ?   ? ?Functional Limitations Info  ?Speech   ?  ?Speech Info: Impaired (delayed responses)  ? ? ?SPECIAL CARE FACTORS FREQUENCY  ?Blood pressure Blood Pressure Frequency: x2 ?  ?  ?  ?  ?  ?  ?   ? ? ?Contractures Contractures Info: Not present  ? ? ?Additional Factors Info  ?Code Status Code Status Info: FULL ?  ?  ?  ?  ?   ? ?Current Medications (10/05/2021):  This is the current hospital active medication list ?Current Facility-Administered Medications  ?Medication Dose Route Frequency Provider Last Rate Last Admin  ? acetaminophen (TYLENOL) tablet 650 mg  650 mg Oral Q6H PRN Caroline Sauger, NP   650 mg at 10/04/21 2157  ? alum & mag hydroxide-simeth (MAALOX/MYLANTA) 200-200-20 MG/5ML suspension 30 mL  30 mL Oral Q4H PRN Parks Ranger, DO   30 mL at 10/01/21 2215  ? amLODipine (NORVASC) tablet 10 mg  10 mg Oral Daily Parks Ranger, DO   10 mg at 10/05/21 4010  ? cyclobenzaprine (FLEXERIL) tablet 10 mg  10 mg Oral QHS Parks Ranger, DO   10 mg at 10/04/21 2157  ? escitalopram (LEXAPRO) tablet 10 mg  10 mg Oral Daily Parks Ranger, DO   10 mg at 10/05/21 2725  ? hydrALAZINE (APRESOLINE) tablet 50 mg  50 mg Oral TID Parks Ranger, DO  50 mg at 10/05/21 1452  ? hydrochlorothiazide (HYDRODIURIL) tablet 25 mg  25 mg Oral Daily Parks Ranger, DO   25 mg at 10/05/21 1610  ? insulin aspart (novoLOG) injection 0-15 Units  0-15 Units Subcutaneous TID WC Parks Ranger, DO   5 Units at 10/05/21 1208  ? insulin aspart (novoLOG) injection 0-5 Units  0-5 Units Subcutaneous QHS Parks Ranger, DO   3 Units at 10/02/21 2131  ? ondansetron (ZOFRAN) tablet 4 mg  4 mg Oral Q6H PRN Parks Ranger, DO      ? Or  ? ondansetron (ZOFRAN) injection 4 mg  4 mg Intravenous Q6H PRN Parks Ranger, DO      ?  paliperidone (INVEGA) 24 hr tablet 6 mg  6 mg Oral Daily Parks Ranger, DO   6 mg at 10/05/21 0950  ? polyethylene glycol (MIRALAX / GLYCOLAX) packet 17 g  17 g Oral Daily PRN Parks Ranger, DO      ? traZODone (DESYREL) tablet 100 mg  100 mg Oral QHS Parks Ranger, DO   100 mg at 10/04/21 2157  ? ? ? ?Discharge Medications: ?Please see discharge summary for a list of discharge medications. ? ?Relevant Imaging Results: ? ?Relevant Lab Results: ? ? 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 ?Rosalita Carey A [place], LCSWA ? ? ? ? ?

## 2021-10-05 NOTE — Plan of Care (Signed)
Patient Calm and Cooperative on unit during shift.  Pt deneis SI/HI/AVH and contracts for safety.  Pt denies Anxiety and Depression.  Pt states "medication management" as reason for hospitalization.  Pt verbalized "exercising her arm" as a healthy coping skill she will utilize upon discharge. Encouragement and support given. Pt did participate in therapeutic milieu.  Patient did have adequate intake. Q 15 minute safety checks in place.  Plan oc care will continue.   ?Problem: Activity: ?Goal: Interest or engagement in activities will improve ?Outcome: Progressing ?  ?Problem: Education: ?Goal: Knowledge of [facility] Education information/materials will improve ?Outcome: Not Progressing ?Goal: Verbalization of understanding the information provided will improve ?Outcome: Not Progressing ?  ?

## 2021-10-05 NOTE — Progress Notes (Signed)
[facility] MD Progress Note  10/05/2021 11:19 AM Alicia Rogers  MRN:  [id number] Subjective: Patient is seen today.  She answers questions in a very soft voice.  Nurses have been encouraging her to exercise her left arm.  OT came and saw her and recommended a splint but she does not want one.  She is pleasant and cooperative in taking her medications as prescribed.  She denies any auditory hallucinations although she does have a history of it.  I spoke with his social work on discharge planning because she is going to need some type of assistance.  Her son dropped her off because he could no longer take care of her.  So she may be a disposition problem.  Principal Problem: Schizoaffective disorder, bipolar type ([place]) Diagnosis: Principal Problem:   Schizoaffective disorder, bipolar type ([place])  Total Time spent with patient: 15 minutes  Past Psychiatric History: Yes  Past Medical History:  Past Medical History:  Diagnosis Date   Depression    Diabetes mellitus without complication ([place])    Diabetes mellitus, type II ([place])    Hypertension    Schizophrenia ([place])    History reviewed. No pertinent surgical history. Family History:  Family History  Problem Relation Age of Onset   Diabetes Mother    Glaucoma Mother    Heart disease Mother    Heart attack Mother    Depression Mother    Hypertension Mother    Diabetes Father    Glaucoma Father    Hypertension Father    Diabetes Sister    Depression Sister    Post-traumatic stress disorder Sister     Social History:  Social History   Substance and Sexual Activity  Alcohol Use No   Alcohol/week: 0.0 standard drinks     Social History   Substance and Sexual Activity  Drug Use No    Social History   Socioeconomic History   Marital status: Single    Spouse name: Not on file   Number of children: Not on file   Years of education: Not on file   Highest education level: Not on file  Occupational History   Not on file  Tobacco  Use   Smoking status: Every Day    Packs/day: 1.00    Types: Cigarettes    Start date: 04/21/1978   Smokeless tobacco: Never  Vaping Use   Vaping Use: Never used  Substance and Sexual Activity   Alcohol use: No    Alcohol/week: 0.0 standard drinks   Drug use: No   Sexual activity: Not Currently  Other Topics Concern   Not on file  Social History Narrative   Not on file   Social Determinants of Health   Financial Resource Strain: Not on file  Food Insecurity: Not on file  Transportation Needs: Not on file  Physical Activity: Not on file  Stress: Not on file  Social Connections: Not on file   Additional Social History:                         Sleep: Good  Appetite:  Fair  Current Medications: Current Facility-Administered Medications  Medication Dose Route Frequency Provider Last Rate Last Admin   acetaminophen (TYLENOL) tablet 650 mg  650 mg Oral Q6H PRN Caroline Sauger, NP   650 mg at 10/04/21 2157   alum & mag hydroxide-simeth (MAALOX/MYLANTA) 200-200-20 MG/5ML suspension 30 mL  30 mL Oral Q4H PRN Parks Ranger, DO  30 mL at 10/01/21 2215   amLODipine (NORVASC) tablet 10 mg  10 mg Oral Daily Parks Ranger, DO   10 mg at 10/05/21 0867   cyclobenzaprine (FLEXERIL) tablet 10 mg  10 mg Oral QHS Parks Ranger, DO   10 mg at 10/04/21 2157   escitalopram (LEXAPRO) tablet 10 mg  10 mg Oral Daily Parks Ranger, DO   10 mg at 10/05/21 6195   hydrALAZINE (APRESOLINE) tablet 50 mg  50 mg Oral TID Parks Ranger, DO   50 mg at 10/05/21 0847   hydrochlorothiazide (HYDRODIURIL) tablet 25 mg  25 mg Oral Daily Parks Ranger, DO   25 mg at 10/05/21 0950   insulin aspart (novoLOG) injection 0-15 Units  0-15 Units Subcutaneous TID WC Parks Ranger, DO   3 Units at 10/05/21 0932   insulin aspart (novoLOG) injection 0-5 Units  0-5 Units Subcutaneous QHS Parks Ranger, DO   3 Units at 10/02/21 2131    ondansetron (ZOFRAN) tablet 4 mg  4 mg Oral Q6H PRN Parks Ranger, DO       Or   ondansetron [facility]) injection 4 mg  4 mg Intravenous Q6H PRN Parks Ranger, DO       paliperidone (INVEGA) 24 hr tablet 6 mg  6 mg Oral Daily Parks Ranger, DO   6 mg at 10/05/21 0950   polyethylene glycol (MIRALAX / GLYCOLAX) packet 17 g  17 g Oral Daily PRN Parks Ranger, DO       traZODone (DESYREL) tablet 100 mg  100 mg Oral QHS Parks Ranger, DO   100 mg at 10/04/21 2157    Lab Results:  Results for orders placed or performed during the hospital encounter of 09/29/21 (from the past 48 hour(s))  Glucose, capillary     Status: Abnormal   Collection Time: 10/03/21 11:41 AM  Result Value Ref Range   Glucose-Capillary 184 (H) 70 - 99 mg/dL    Comment: Glucose reference range applies only to samples taken after fasting for at least 8 hours.  Glucose, capillary     Status: Abnormal   Collection Time: 10/03/21  4:31 PM  Result Value Ref Range   Glucose-Capillary 339 (H) 70 - 99 mg/dL    Comment: Glucose reference range applies only to samples taken after fasting for at least 8 hours.  Glucose, capillary     Status: Abnormal   Collection Time: 10/03/21  7:38 PM  Result Value Ref Range   Glucose-Capillary 122 (H) 70 - 99 mg/dL    Comment: Glucose reference range applies only to samples taken after fasting for at least 8 hours.  Creatinine, serum     Status: Abnormal   Collection Time: 10/04/21  6:31 AM  Result Value Ref Range   Creatinine, Ser 2.34 (H) 0.44 - 1.00 mg/dL   GFR, Estimated 23 (L) >60 mL/min    Comment: (NOTE) Calculated using the CKD-EPI Creatinine Equation (2021) Performed at [facility], [address] [place], [place] [address]   Glucose, capillary     Status: Abnormal   Collection Time: 10/04/21  7:46 AM  Result Value Ref Range   Glucose-Capillary 175 (H) 70 - 99 mg/dL    Comment: Glucose reference range applies only to  samples taken after fasting for at least 8 hours.  Glucose, capillary     Status: Abnormal   Collection Time: 10/04/21 11:43 AM  Result Value Ref Range   Glucose-Capillary 206 (  H) 70 - 99 mg/dL    Comment: Glucose reference range applies only to samples taken after fasting for at least 8 hours.  Glucose, capillary     Status: Abnormal   Collection Time: 10/04/21  4:19 PM  Result Value Ref Range   Glucose-Capillary 159 (H) 70 - 99 mg/dL    Comment: Glucose reference range applies only to samples taken after fasting for at least 8 hours.  Glucose, capillary     Status: Abnormal   Collection Time: 10/04/21  7:51 PM  Result Value Ref Range   Glucose-Capillary 148 (H) 70 - 99 mg/dL    Comment: Glucose reference range applies only to samples taken after fasting for at least 8 hours.  Glucose, capillary     Status: Abnormal   Collection Time: 10/05/21  8:27 AM  Result Value Ref Range   Glucose-Capillary 181 (H) 70 - 99 mg/dL    Comment: Glucose reference range applies only to samples taken after fasting for at least 8 hours.    Blood Alcohol level:  Lab Results  Component Value Date   ETH <10 09/20/2021   ETH <10 54/65/6812    Metabolic Disorder Labs: Lab Results  Component Value Date   HGBA1C 8.0 (H) 09/27/2021   MPG 182.9 09/27/2021   MPG 180.03 10/13/2020   No results found for: PROLACTIN Lab Results  Component Value Date   CHOL 267 (H) 10/13/2020   TRIG 180 (H) 10/13/2020   HDL 86 10/13/2020   CHOLHDL 3.1 10/13/2020   VLDL 36 10/13/2020   LDLCALC 145 (H) 10/13/2020   LDLCALC 158 (H) 02/29/2020    Physical Findings: AIMS:  , ,  ,  ,    CIWA:    COWS:     Musculoskeletal: Strength & Muscle Tone: decreased Gait & Station: unsteady Patient leans: N/A  Psychiatric Specialty Exam:  Presentation  General Appearance: Bizarre; Disheveled  Eye Contact:Fair  Speech:Garbled  Speech Volume:Decreased  Handedness:Right   Mood and Affect  Mood:No data  recorded Affect:Flat   Thought Process  Thought Processes:Disorganized  Descriptions of Associations:Loose  Orientation:Partial  Thought Content:Illogical  History of Schizophrenia/Schizoaffective disorder:Yes  Duration of Psychotic Symptoms:Greater than six months  Hallucinations:No data recorded Ideas of Reference:No data recorded Suicidal Thoughts:No data recorded Homicidal Thoughts:No data recorded  Sensorium  Memory:Immediate Poor  Judgment:Impaired  Insight:Lacking   Executive Functions  Concentration:Poor  Attention Span:Poor  Recall:Poor  Fund of Knowledge:Poor  Language:Poor   Psychomotor Activity  Psychomotor Activity:No data recorded  Assets  Assets:Housing; Social Support   Sleep  Sleep:No data recorded   Physical Exam: Physical Exam Vitals and nursing note reviewed.  Constitutional:      Appearance: Normal appearance. She is normal weight.  Neurological:     General: No focal deficit present.     Mental Status: She is alert and oriented to person, place, and time.  Psychiatric:        Attention and Perception: Attention and perception normal.        Mood and Affect: Mood is depressed. Affect is labile.        Speech: Speech is delayed.        Behavior: Behavior normal. Behavior is cooperative.        Thought Content: Thought content is paranoid.        Cognition and Memory: Memory normal. Cognition is impaired.        Judgment: Judgment normal.   ROS Blood pressure (!) 136/58, pulse 64, temperature 97.6 F (36.4  C), temperature source Oral, resp. rate 18, height 5' 2.99" (1.6 m), weight 46.9 kg, SpO2 100 %. Body mass index is 18.34 kg/m.   Treatment Plan Summary: Daily contact with patient to assess and evaluate symptoms and progress in treatment, Medication management, and Plan continue current medications.  Parks Ranger, DO 10/05/2021, 11:19 AM

## 2021-10-05 NOTE — Progress Notes (Signed)
Recreation Therapy Notes ? ?Date: 10/05/2021  ?  ?Time: 1:00pm  ? ?Location: Court yard   ?  ?Behavioral response: Appropriate ?  ?Intervention Topic:  Leisure   ?  ?Discussion/Intervention:  ?Group content today was focused on leisure. The group defined what leisure is and some positive leisure activities they participate in. Individuals identified the difference between good and bad leisure. Participants expressed how they feel after participating in the leisure of their choice. The group discussed how they go about picking a leisure activity and if others are involved in their leisure activities. The patient stated how many leisure activities they have to choose from and reasons why it is important to have leisure time. Individuals participated in the intervention ?Exploration of Leisure? where they had a chance to identify new leisure activities as well as benefits of leisure. ?  ?Clinical Observations/Feedback: ?Patient came to group and was able to focus on group topic and task at hand. Individual was social with peers and staff while participating in the intervention.   ?Kazimierz Springborn LRT/CTRS  ?  ? ? ? ? ? ? ? ?[name] ?10/05/2021 2:40 PM ?

## 2021-10-05 NOTE — Plan of Care (Signed)
Patient Is alert and oriented. Patient has delayed responses to commands.  Affect is flat, with minimal interaction.  Patient verbalizes sleeping good last night. Patient denies AVH, SI, HI, anxiety or depression.  Patient is complaint with medications. Patient has incontinent episodes. Ambulation is slow and steady. Patient denies pain at this time. Will continue to monitor with Q 15 min safety checks. ? ? ?Problem: Education: ?Goal: Knowledge of [place] [facility] Education information/materials will improve ?Outcome: Progressing ?Goal: Emotional status will improve ?Outcome: Progressing ?Goal: Mental status will improve ?Outcome: Progressing ?Goal: Verbalization of understanding the information provided will improve ?Outcome: Progressing ?  ?Problem: Activity: ?Goal: Interest or engagement in activities will improve ?Outcome: Progressing ?Goal: Sleeping patterns will improve ?Outcome: Progressing ?  ?Problem: Coping: ?Goal: Ability to verbalize frustrations and anger appropriately will improve ?Outcome: Progressing ?Goal: Ability to demonstrate self-control will improve ?Outcome: Progressing ?  ?Problem: Health Behavior/Discharge Planning: ?Goal: Identification of resources available to assist in meeting health care needs will improve ?Outcome: Progressing ?Goal: Compliance with treatment plan for underlying cause of condition will improve ?Outcome: Progressing ?  ?Problem: Physical Regulation: ?Goal: Ability to maintain clinical measurements within normal limits will improve ?Outcome: Progressing ?  ?Problem: Safety: ?Goal: Periods of time without injury will increase ?Outcome: Progressing ?  ?

## 2021-10-05 NOTE — B[facility] Counselor (Signed)
CSW contacted [facility],  [phone], to inquire about bed openings. Facility has one female bed that is private pay. Patient is interested in Medicare coverage for service. CSW will contact other facilities regarding potential bed availability.  ? ?Alicia Rogers, MSW, LCSW-A ?3/6/20233:40 PM  ?

## 2021-10-05 NOTE — Progress Notes (Incomplete)
Patient remains alert and oriented with flat affect and delayed speech. Patient had an incontinent episode of urine this am. Patient bed and scrub observed to be soak. Patient assisted with incontinent care and shower., clean and dry scrub provided. Complete linen change. Patient gait remain slow and unsteady. Left hand remain edematous. Patient encourage to ask for help when needed and the importance not to stay in wet clothing. Patient continue to require assistance with ADL's. During assessment patient denies pain or discomfort. Denies SI, HI, and AVH.  Compliant with all due medications. Ate breakfast in the day room among staff and peers with good appetite. Remain safe on the unit with Q 15 minute safety checks. ?

## 2021-10-06 DIAGNOSIS — F250 Schizoaffective disorder, bipolar type: Secondary | ICD-10-CM | POA: Diagnosis not present

## 2021-10-06 LAB — GLUCOSE, CAPILLARY
Glucose-Capillary: 163 mg/dL — ABNORMAL HIGH (ref 70–99)
Glucose-Capillary: 164 mg/dL — ABNORMAL HIGH (ref 70–99)
Glucose-Capillary: 147 mg/dL — ABNORMAL HIGH (ref 70–99)
Glucose-Capillary: 116 mg/dL — ABNORMAL HIGH (ref 70–99)

## 2021-10-06 NOTE — Group Note (Deleted)
[place] LCSW Group Therapy Note ? ? ? ?Group Date: 10/06/2021 ?Start Time: 1300 ?End Time: 1400 ? ?Type of Therapy and Topic:  Group Therapy:  Overcoming Obstacles ? ?Participation Level:  {[facility] PARTICIPATION LEVEL:22264:::1} ? ?Mood: ? ?Description of Group:   ?In this group patients will be encouraged to explore what they see as obstacles to their own wellness and recovery. They will be guided to discuss their thoughts, feelings, and behaviors related to these obstacles. The group will process together ways to cope with barriers, with attention given to specific choices patients can make. Each patient will be challenged to identify changes they are motivated to make in order to overcome their obstacles. This group will be process-oriented, with patients participating in exploration of their own experiences as well as giving and receiving support and challenge from other group members. ? ?Therapeutic Goals: ?1. Patient will identify personal and current obstacles as they relate to admission. ?2. Patient will identify barriers that currently interfere with their wellness or overcoming obstacles.  ?3. Patient will identify feelings, thought process and behaviors related to these barriers. ?4. Patient will identify two changes they are willing to make to overcome these obstacles:  ? ? ?Summary of Patient Progress ? ? ?*** ? ? ?Therapeutic Modalities:   ?Cognitive Behavioral Therapy ?Solution Focused Therapy ?Motivational Interviewing ?Relapse Prevention Therapy ? ? ?Durenda Hurt, LCSWA ?

## 2021-10-06 NOTE — Plan of Care (Signed)
Patient is calm an cooperative, has a flat affect and minimal interactions with peers and staff She has delayed responses to commands and her ambulation is slow but steady.   Patient verbalizes sleeping good last night. Patient denies AVH, SI, HI, anxiety or depression. AM medications administered as ordered. Emotional support provided. Patient denies pain at this time. Will continue to monitor with Q 15 minutes safety checks. ? ? ?Problem: Education: ?Goal: Knowledge of [place] [facility] Education information/materials will improve ?Outcome: Progressing ?Goal: Emotional status will improve ?Outcome: Progressing ?Goal: Mental status will improve ?Outcome: Progressing ?Goal: Verbalization of understanding the information provided will improve ?Outcome: Progressing ?  ?Problem: Activity: ?Goal: Interest or engagement in activities will improve ?Outcome: Progressing ?Goal: Sleeping patterns will improve ?Outcome: Progressing ?  ?Problem: Coping: ?Goal: Ability to verbalize frustrations and anger appropriately will improve ?Outcome: Progressing ?Goal: Ability to demonstrate self-control will improve ?Outcome: Progressing ?  ?Problem: Health Behavior/Discharge Planning: ?Goal: Identification of resources available to assist in meeting health care needs will improve ?Outcome: Progressing ?Goal: Compliance with treatment plan for underlying cause of condition will improve ?Outcome: Progressing ?  ?Problem: Physical Regulation: ?Goal: Ability to maintain clinical measurements within normal limits will improve ?Outcome: Progressing ?  ?Problem: Safety: ?Goal: Periods of time without injury will increase ?Outcome: Progressing ?  ?

## 2021-10-06 NOTE — Progress Notes (Signed)
Recreation Therapy Notes ? ?Date: 10/06/2021  ? ?Time: 1:15pm   ? ?Location: Courtyard   ? ?Behavioral response: Appropriate ? ?Intervention Topic:  Social Skills  ? ?Discussion/Intervention:  ?Group content on today was focused on social skills. The group defined social skills and identified ways they use social skills. Patients expressed what obstacles they face when trying to be social. Participants described the importance of social skills. The group listed ways to improve social skills and reasons to improve social skills. Individuals had an opportunity to learn new and improve social skills as well as identify their weaknesses. ? ?Clinical Observations/Feedback: ?Patient came to group and was able to identify their main form of communication and the importance of communicating. Participant was able to focus on the task and topic during group. Individual was social with peers and staff while participating in the intervention.   ?[name] LRT/CTRS  ? ? ? ? ? ? ? ?Alicia Rogers ?10/06/2021 2:31 PM ?

## 2021-10-06 NOTE — Clinical Note (Incomplete)
°

## 2021-10-06 NOTE — Plan of Care (Signed)
Pt Calm and Cooperative during shift. Patient denies SI/HI/Avh and contracts for safety.  Pt denies Depression and Anxiety. Pt continues to present with Delayed response. Patient did comply with scheduled medications as ordered by provider. Pt denies any adverse reactions.Emotional support and encouragement given.  Patient did participate in therapeutic milieu. Patient did have adequate intake.  Pt informed to notify staff if any needs arise. Q 15 minute safety checks in place. Plan of care will continue.  ? ?Problem: Education: ?Goal: Knowledge of [place] [facility] Education information/materials will improve ?Outcome: Progressing ?Goal: Emotional status will improve ?Outcome: Progressing ?Goal: Mental status will improve ?Outcome: Progressing ?Goal: Verbalization of understanding the information provided will improve ?Outcome: Progressing ? ? ?

## 2021-10-06 NOTE — Progress Notes (Signed)
[facility] MD Progress Note  10/06/2021 1:44 PM Alicia Rogers  MRN:  [id number] Subjective: Alicia Rogers states that her arm bothers her and I told her that OT wanted to split it but she says that she does not want it.  Social work completed and felt to for placement which I signed.  She is taking her medications as prescribed and denies any side effects.  She denies any auditory or visual hallucinations.  She is pleasant and cooperative.  Principal Problem: Schizoaffective disorder, bipolar type ([place]) Diagnosis: Principal Problem:   Schizoaffective disorder, bipolar type ([place])  Total Time spent with patient: 15 minutes  Past Psychiatric History: Schizophrenia  Past Medical History:  Past Medical History:  Diagnosis Date   Depression    Diabetes mellitus without complication ([place])    Diabetes mellitus, type II ([place])    Hypertension    Schizophrenia ([place])    History reviewed. No pertinent surgical history. Family History:  Family History  Problem Relation Age of Onset   Diabetes Mother    Glaucoma Mother    Heart disease Mother    Heart attack Mother    Depression Mother    Hypertension Mother    Diabetes Father    Glaucoma Father    Hypertension Father    Diabetes Sister    Depression Sister    Post-traumatic stress disorder Sister    Family Psychiatric  History:  Social History:  Social History   Substance and Sexual Activity  Alcohol Use No   Alcohol/week: 0.0 standard drinks     Social History   Substance and Sexual Activity  Drug Use No    Social History   Socioeconomic History   Marital status: Single    Spouse name: Not on file   Number of children: Not on file   Years of education: Not on file   Highest education level: Not on file  Occupational History   Not on file  Tobacco Use   Smoking status: Every Day    Packs/day: 1.00    Types: Cigarettes    Start date: 04/21/1978   Smokeless tobacco: Never  Vaping Use   Vaping Use: Never used  Substance  and Sexual Activity   Alcohol use: No    Alcohol/week: 0.0 standard drinks   Drug use: No   Sexual activity: Not Currently  Other Topics Concern   Not on file  Social History Narrative   Not on file   Social Determinants of Health   Financial Resource Strain: Not on file  Food Insecurity: Not on file  Transportation Needs: Not on file  Physical Activity: Not on file  Stress: Not on file  Social Connections: Not on file   Additional Social History:                         Sleep: Fair  Appetite:  Fair  Current Medications: Current Facility-Administered Medications  Medication Dose Route Frequency Provider Last Rate Last Admin   acetaminophen (TYLENOL) tablet 650 mg  650 mg Oral Q6H PRN Caroline Sauger, NP   650 mg at 10/04/21 2157   alum & mag hydroxide-simeth (MAALOX/MYLANTA) 200-200-20 MG/5ML suspension 30 mL  30 mL Oral Q4H PRN Parks Ranger, DO   30 mL at 10/01/21 2215   amLODipine (NORVASC) tablet 10 mg  10 mg Oral Daily Parks Ranger, DO   10 mg at 10/06/21 0947   cyclobenzaprine (FLEXERIL) tablet 10 mg  10 mg Oral QHS  Parks Ranger, DO   10 mg at 10/05/21 2134   escitalopram (LEXAPRO) tablet 10 mg  10 mg Oral Daily Parks Ranger, DO   10 mg at 10/06/21 2947   hydrALAZINE (APRESOLINE) tablet 50 mg  50 mg Oral TID Parks Ranger, DO   50 mg at 10/06/21 0849   hydrochlorothiazide (HYDRODIURIL) tablet 25 mg  25 mg Oral Daily Parks Ranger, DO   25 mg at 10/06/21 0947   insulin aspart (novoLOG) injection 0-15 Units  0-15 Units Subcutaneous TID WC Parks Ranger, DO   3 Units at 10/06/21 1159   insulin aspart (novoLOG) injection 0-5 Units  0-5 Units Subcutaneous QHS Parks Ranger, DO   3 Units at 10/02/21 2131   ondansetron (ZOFRAN) tablet 4 mg  4 mg Oral Q6H PRN Parks Ranger, DO       Or   ondansetron [facility]) injection 4 mg  4 mg Intravenous Q6H PRN Parks Ranger,  DO       paliperidone (INVEGA) 24 hr tablet 6 mg  6 mg Oral Daily Parks Ranger, DO   6 mg at 10/06/21 6546   polyethylene glycol (MIRALAX / GLYCOLAX) packet 17 g  17 g Oral Daily PRN Parks Ranger, DO       traZODone (DESYREL) tablet 100 mg  100 mg Oral QHS Parks Ranger, DO   100 mg at 10/05/21 2135    Lab Results:  Results for orders placed or performed during the hospital encounter of 09/29/21 (from the past 48 hour(s))  Glucose, capillary     Status: Abnormal   Collection Time: 10/04/21  4:19 PM  Result Value Ref Range   Glucose-Capillary 159 (H) 70 - 99 mg/dL    Comment: Glucose reference range applies only to samples taken after fasting for at least 8 hours.  Glucose, capillary     Status: Abnormal   Collection Time: 10/04/21  7:51 PM  Result Value Ref Range   Glucose-Capillary 148 (H) 70 - 99 mg/dL    Comment: Glucose reference range applies only to samples taken after fasting for at least 8 hours.  Glucose, capillary     Status: Abnormal   Collection Time: 10/05/21  8:27 AM  Result Value Ref Range   Glucose-Capillary 181 (H) 70 - 99 mg/dL    Comment: Glucose reference range applies only to samples taken after fasting for at least 8 hours.  Glucose, capillary     Status: Abnormal   Collection Time: 10/05/21 11:55 AM  Result Value Ref Range   Glucose-Capillary 242 (H) 70 - 99 mg/dL    Comment: Glucose reference range applies only to samples taken after fasting for at least 8 hours.  Glucose, capillary     Status: Abnormal   Collection Time: 10/05/21  4:28 PM  Result Value Ref Range   Glucose-Capillary 175 (H) 70 - 99 mg/dL    Comment: Glucose reference range applies only to samples taken after fasting for at least 8 hours.  Glucose, capillary     Status: Abnormal   Collection Time: 10/05/21  9:36 PM  Result Value Ref Range   Glucose-Capillary 189 (H) 70 - 99 mg/dL    Comment: Glucose reference range applies only to samples taken after fasting  for at least 8 hours.   Comment 1 Notify RN   Glucose, capillary     Status: Abnormal   Collection Time: 10/06/21  8:02 AM  Result Value Ref Range  Glucose-Capillary 163 (H) 70 - 99 mg/dL    Comment: Glucose reference range applies only to samples taken after fasting for at least 8 hours.  Glucose, capillary     Status: Abnormal   Collection Time: 10/06/21 11:38 AM  Result Value Ref Range   Glucose-Capillary 164 (H) 70 - 99 mg/dL    Comment: Glucose reference range applies only to samples taken after fasting for at least 8 hours.    Blood Alcohol level:  Lab Results  Component Value Date   ETH <10 09/20/2021   ETH <10 34/19/6222    Metabolic Disorder Labs: Lab Results  Component Value Date   HGBA1C 8.0 (H) 09/27/2021   MPG 182.9 09/27/2021   MPG 180.03 10/13/2020   No results found for: PROLACTIN Lab Results  Component Value Date   CHOL 267 (H) 10/13/2020   TRIG 180 (H) 10/13/2020   HDL 86 10/13/2020   CHOLHDL 3.1 10/13/2020   VLDL 36 10/13/2020   LDLCALC 145 (H) 10/13/2020   LDLCALC 158 (H) 02/29/2020    Physical Findings: AIMS:  , ,  ,  ,    CIWA:    COWS:     Musculoskeletal: Strength & Muscle Tone: abnormal Gait & Station: unsteady Patient leans: N/A  Psychiatric Specialty Exam:  Presentation  General Appearance: Bizarre; Disheveled  Eye Contact:Fair  Speech:Garbled  Speech Volume:Decreased  Handedness:Right   Mood and Affect  Mood:No data recorded Affect:Flat   Thought Process  Thought Processes:Disorganized  Descriptions of Associations:Loose  Orientation:Partial  Thought Content:Illogical  History of Schizophrenia/Schizoaffective disorder:Yes  Duration of Psychotic Symptoms:Greater than six months  Hallucinations:No data recorded Ideas of Reference:No data recorded Suicidal Thoughts:No data recorded Homicidal Thoughts:No data recorded  Sensorium  Memory:Immediate Poor  Judgment:Impaired  Insight:Lacking   Executive  Functions  Concentration:Poor  Attention Span:Poor  Recall:Poor  Fund of Knowledge:Poor  Language:Poor   Psychomotor Activity  Psychomotor Activity:No data recorded  Assets  Assets:Housing; Social Support   Sleep  Sleep:No data recorded   Physical Exam: Physical Exam Vitals and nursing note reviewed.  Constitutional:      Appearance: Normal appearance. She is normal weight.  Neurological:     General: No focal deficit present.     Mental Status: She is alert and oriented to person, place, and time.  Psychiatric:        Attention and Perception: Attention and perception normal.        Mood and Affect: Mood is depressed. Affect is blunt.        Speech: Speech is delayed.        Behavior: Behavior normal. Behavior is cooperative.        Thought Content: Thought content normal.        Cognition and Memory: Cognition is impaired. Memory is impaired.        Judgment: Judgment normal.   Review of Systems  Constitutional: Negative.   HENT: Negative.    Eyes: Negative.   Respiratory: Negative.    Cardiovascular: Negative.   Gastrointestinal: Negative.   Genitourinary: Negative.   Musculoskeletal: Negative.   Skin: Negative.   Neurological: Negative.   Endo/Heme/Allergies: Negative.   Psychiatric/Behavioral:  Positive for depression.   Blood pressure (!) 176/52, pulse 75, temperature 97.8 F (36.6 C), temperature source Oral, resp. rate 18, height 5' 2.99" (1.6 m), weight 46.9 kg, SpO2 99 %. Body mass index is 18.34 kg/m.   Treatment Plan Summary: Daily contact with patient to assess and evaluate symptoms and progress in treatment, Medication  management, and Plan social work is working on discharge placement.  She definitely cannot be discharged to a shelter or the street because she does not have the capacity to take care of herself.  Parks Ranger, DO 10/06/2021, 1:44 PM

## 2021-10-07 DIAGNOSIS — F250 Schizoaffective disorder, bipolar type: Secondary | ICD-10-CM | POA: Diagnosis not present

## 2021-10-07 LAB — GLUCOSE, CAPILLARY
Glucose-Capillary: 152 mg/dL — ABNORMAL HIGH (ref 70–99)
Glucose-Capillary: 226 mg/dL — ABNORMAL HIGH (ref 70–99)
Glucose-Capillary: 145 mg/dL — ABNORMAL HIGH (ref 70–99)
Glucose-Capillary: 142 mg/dL — ABNORMAL HIGH (ref 70–99)

## 2021-10-07 NOTE — BHH Counselor (Addendum)
CSW spoke with Louann Sjogren at [facility], [phone], regarding home health care for pt. She stated that they were providing services for pt in February before pt was admitted to the hospital.  ? ?She stated that they could continue services in the community if they received a resumption of care orders from the provider in the hospital with specifications regarding which services pt would need.  ? ?She also stated that the case worker can provide resources for recommendations for higher level of care but they do not place pt in higher level of care facilities.  ? ?CSW will contact pt's sister in regards to information for care options from [place] services.  ? ?Doni Widmer [place], MSW, LCSW-A ?3/8/20233:11 PM  ?

## 2021-10-07 NOTE — Progress Notes (Signed)
Pt in the dayroom, doing a puzzle no interaction with peers or staff. She denies SI/HI. She reported having AVH and "the voces talk to themselves. She would not discuss what she sees. She stated she hit her lt arm on something in September 2021, but she did not make any sense. ?

## 2021-10-07 NOTE — Progress Notes (Signed)
Recreation Therapy Notes ? ?Date: 10/07/2021 ?  ?Time: 1:15pm   ?  ?Location: Craft room     ?  ?Behavioral response: N/A ?  ?Intervention Topic: Strengths  ?  ?Discussion/Intervention: ?Patient refused to attend group.  ?  ?Clinical Observations/Feedback:  ?Patient refused to attend group.  ?  ?Alicia Rogers LRT/CTRS ? ? ? ? ? ? ? ?Alicia Rogers ?10/07/2021 1:58 PM ?

## 2021-10-07 NOTE — Plan of Care (Signed)
Pt Calm and Cooperative during shift. Patient denies SI/HI/Avh and contracts for safety.  Pt denies Depression and Anxiety. Pt continues to present with Delayed responses and Flat affect. Patient did comply with scheduled medications as ordered by provider. BS stable and managed with medications. Pt denies any adverse reactions. Emotional support and encouragement given during shift.  Patient did participate in therapeutic milieu and states she did enjoy recreational group. Patient did have adequate intake.  Pt informed to notify staff if any needs arise. Q 15 minute safety checks in place. Plan of care will continue. ?  ?Problem: Education: ?Goal: Knowledge of [facility] Education information/materials will improve ?Outcome: Progressing ?Goal: Emotional status will improve ?Outcome: Progressing ?Goal: Mental status will improve ?Outcome: Progressing ?  ?

## 2021-10-07 NOTE — Group Note (Unsigned)
[place] LCSW Group Therapy Note ? ? ?Group Date: 10/07/2021 ?Start Time: 1300 ?End Time: 1400 ? ? ?Type of Therapy/Topic:  Group Therapy:  Emotion Regulation ? ?Participation Level:  {[facility] PARTICIPATION LEVEL:22264}  ? ?Mood: ? ?Description of Group:   ? The purpose of this group is to assist patients in learning to regulate negative emotions and experience positive emotions. Patients will be guided to discuss ways in which they have been vulnerable to their negative emotions. These vulnerabilities will be juxtaposed with experiences of positive emotions or situations, and patients challenged to use positive emotions to combat negative ones. Special emphasis will be placed on coping with negative emotions in conflict situations, and patients will process healthy conflict resolution skills. ? ?Therapeutic Goals: ?1. Patient will identify two positive emotions or experiences to reflect on in order to balance out negative emotions:  ?2. Patient will label two or more emotions that they find the most difficult to experience:  ?3. Patient will be able to demonstrate positive conflict resolution skills through discussion or role plays:  ? ?Summary of Patient Progress: ? ? ?*** ? ? ? ?Therapeutic Modalities:   ?Cognitive Behavioral Therapy ?Feelings Identification ?Dialectical Behavioral Therapy ? ? ?Durenda Hurt, LCSWA ?

## 2021-10-07 NOTE — Progress Notes (Signed)
[facility] MD Progress Note  10/07/2021 11:54 AM Alicia Rogers  MRN:  [id number] Subjective: The patient is seen and examined.  She has no complaints.  She is taking her medications as prescribed and denies any side effects.  Social work has filed enough help to for placement.  She denies any suicidal ideation or homicidal ideation.  She is not safe to discharge.  Principal Problem: Schizoaffective disorder, bipolar type ([place]) Diagnosis: Principal Problem:   Schizoaffective disorder, bipolar type ([place])  Total Time spent with patient: 15 minutes  Past Psychiatric History: [place]  Past Medical History:  Past Medical History:  Diagnosis Date   Depression    Diabetes mellitus without complication ([place])    Diabetes mellitus, type II ([place])    Hypertension    Schizophrenia ([place])    History reviewed. No pertinent surgical history. Family History:  Family History  Problem Relation Age of Onset   Diabetes Mother    Glaucoma Mother    Heart disease Mother    Heart attack Mother    Depression Mother    Hypertension Mother    Diabetes Father    Glaucoma Father    Hypertension Father    Diabetes Sister    Depression Sister    Post-traumatic stress disorder Sister     Social History:  Social History   Substance and Sexual Activity  Alcohol Use No   Alcohol/week: 0.0 standard drinks     Social History   Substance and Sexual Activity  Drug Use No    Social History   Socioeconomic History   Marital status: Single    Spouse name: Not on file   Number of children: Not on file   Years of education: Not on file   Highest education level: Not on file  Occupational History   Not on file  Tobacco Use   Smoking status: Every Day    Packs/day: 1.00    Types: Cigarettes    Start date: 04/21/1978   Smokeless tobacco: Never  Vaping Use   Vaping Use: Never used  Substance and Sexual Activity   Alcohol use: No    Alcohol/week: 0.0 standard drinks   Drug use: No    Sexual activity: Not Currently  Other Topics Concern   Not on file  Social History Narrative   Not on file   Social Determinants of Health   Financial Resource Strain: Not on file  Food Insecurity: Not on file  Transportation Needs: Not on file  Physical Activity: Not on file  Stress: Not on file  Social Connections: Not on file   Additional Social History:                         Sleep: Good  Appetite:  Fair  Current Medications: Current Facility-Administered Medications  Medication Dose Route Frequency Provider Last Rate Last Admin   acetaminophen (TYLENOL) tablet 650 mg  650 mg Oral Q6H PRN Caroline Sauger, NP   650 mg at 10/04/21 2157   alum & mag hydroxide-simeth (MAALOX/MYLANTA) 200-200-20 MG/5ML suspension 30 mL  30 mL Oral Q4H PRN Parks Ranger, DO   30 mL at 10/01/21 2215   amLODipine (NORVASC) tablet 10 mg  10 mg Oral Daily Parks Ranger, DO   10 mg at 10/07/21 0904   cyclobenzaprine (FLEXERIL) tablet 10 mg  10 mg Oral QHS Parks Ranger, DO   10 mg at 10/06/21 2131   escitalopram (LEXAPRO) tablet 10  mg  10 mg Oral Daily Parks Ranger, DO   10 mg at 10/07/21 1610   hydrALAZINE (APRESOLINE) tablet 50 mg  50 mg Oral TID Parks Ranger, DO   50 mg at 10/07/21 9604   hydrochlorothiazide (HYDRODIURIL) tablet 25 mg  25 mg Oral Daily Parks Ranger, DO   25 mg at 10/07/21 5409   insulin aspart (novoLOG) injection 0-15 Units  0-15 Units Subcutaneous TID WC Parks Ranger, DO   3 Units at 10/07/21 8119   insulin aspart (novoLOG) injection 0-5 Units  0-5 Units Subcutaneous QHS Parks Ranger, DO   3 Units at 10/02/21 2131   ondansetron (ZOFRAN) tablet 4 mg  4 mg Oral Q6H PRN Parks Ranger, DO       Or   ondansetron [facility]) injection 4 mg  4 mg Intravenous Q6H PRN Parks Ranger, DO       paliperidone (INVEGA) 24 hr tablet 6 mg  6 mg Oral Daily Parks Ranger,  DO   6 mg at 10/07/21 1478   polyethylene glycol (MIRALAX / GLYCOLAX) packet 17 g  17 g Oral Daily PRN Parks Ranger, DO       traZODone (DESYREL) tablet 100 mg  100 mg Oral QHS Parks Ranger, DO   100 mg at 10/06/21 2131    Lab Results:  Results for orders placed or performed during the hospital encounter of 09/29/21 (from the past 48 hour(s))  Glucose, capillary     Status: Abnormal   Collection Time: 10/05/21 11:55 AM  Result Value Ref Range   Glucose-Capillary 242 (H) 70 - 99 mg/dL    Comment: Glucose reference range applies only to samples taken after fasting for at least 8 hours.  Glucose, capillary     Status: Abnormal   Collection Time: 10/05/21  4:28 PM  Result Value Ref Range   Glucose-Capillary 175 (H) 70 - 99 mg/dL    Comment: Glucose reference range applies only to samples taken after fasting for at least 8 hours.  Glucose, capillary     Status: Abnormal   Collection Time: 10/05/21  9:36 PM  Result Value Ref Range   Glucose-Capillary 189 (H) 70 - 99 mg/dL    Comment: Glucose reference range applies only to samples taken after fasting for at least 8 hours.   Comment 1 Notify RN   Glucose, capillary     Status: Abnormal   Collection Time: 10/06/21  8:02 AM  Result Value Ref Range   Glucose-Capillary 163 (H) 70 - 99 mg/dL    Comment: Glucose reference range applies only to samples taken after fasting for at least 8 hours.  Glucose, capillary     Status: Abnormal   Collection Time: 10/06/21 11:38 AM  Result Value Ref Range   Glucose-Capillary 164 (H) 70 - 99 mg/dL    Comment: Glucose reference range applies only to samples taken after fasting for at least 8 hours.  Glucose, capillary     Status: Abnormal   Collection Time: 10/06/21  4:53 PM  Result Value Ref Range   Glucose-Capillary 147 (H) 70 - 99 mg/dL    Comment: Glucose reference range applies only to samples taken after fasting for at least 8 hours.  Glucose, capillary     Status: Abnormal    Collection Time: 10/06/21  7:43 PM  Result Value Ref Range   Glucose-Capillary 116 (H) 70 - 99 mg/dL    Comment: Glucose reference range applies only to  samples taken after fasting for at least 8 hours.   Comment 1 Notify RN   Glucose, capillary     Status: Abnormal   Collection Time: 10/07/21  8:00 AM  Result Value Ref Range   Glucose-Capillary 152 (H) 70 - 99 mg/dL    Comment: Glucose reference range applies only to samples taken after fasting for at least 8 hours.  Glucose, capillary     Status: Abnormal   Collection Time: 10/07/21 11:38 AM  Result Value Ref Range   Glucose-Capillary 226 (H) 70 - 99 mg/dL    Comment: Glucose reference range applies only to samples taken after fasting for at least 8 hours.    Blood Alcohol level:  Lab Results  Component Value Date   ETH <10 09/20/2021   ETH <10 19/50/9326    Metabolic Disorder Labs: Lab Results  Component Value Date   HGBA1C 8.0 (H) 09/27/2021   MPG 182.9 09/27/2021   MPG 180.03 10/13/2020   No results found for: PROLACTIN Lab Results  Component Value Date   CHOL 267 (H) 10/13/2020   TRIG 180 (H) 10/13/2020   HDL 86 10/13/2020   CHOLHDL 3.1 10/13/2020   VLDL 36 10/13/2020   LDLCALC 145 (H) 10/13/2020   LDLCALC 158 (H) 02/29/2020    Physical Findings: AIMS:  , ,  ,  ,    CIWA:    COWS:     Musculoskeletal: Strength & Muscle Tone: within normal limits Gait & Station: unsteady Patient leans: N/A  Psychiatric Specialty Exam:  Presentation  General Appearance: Bizarre; Disheveled  Eye Contact:Fair  Speech:Garbled  Speech Volume:Decreased  Handedness:Right   Mood and Affect  Mood:No data recorded Affect:Flat   Thought Process  Thought Processes:Disorganized  Descriptions of Associations:Loose  Orientation:Partial  Thought Content:Illogical  History of Schizophrenia/Schizoaffective disorder:Yes  Duration of Psychotic Symptoms:Greater than six months  Hallucinations:No data  recorded Ideas of Reference:No data recorded Suicidal Thoughts:No data recorded Homicidal Thoughts:No data recorded  Sensorium  Memory:Immediate Poor  Judgment:Impaired  Insight:Lacking   Executive Functions  Concentration:Poor  Attention Span:Poor  Recall:Poor  Fund of Knowledge:Poor  Language:Poor   Psychomotor Activity  Psychomotor Activity:No data recorded  Assets  Assets:Housing; Social Support   Sleep  Sleep:No data recorded   Physical Exam: Physical Exam Vitals and nursing note reviewed.  Constitutional:      Appearance: Normal appearance. She is normal weight.  Neurological:     General: No focal deficit present.     Mental Status: She is alert and oriented to person, place, and time.  Psychiatric:        Attention and Perception: Attention and perception normal.        Mood and Affect: Mood is anxious and depressed.        Speech: Speech is delayed.        Behavior: Behavior normal. Behavior is cooperative.        Thought Content: Thought content is paranoid.        Cognition and Memory: Cognition is impaired. Memory is impaired.        Judgment: Judgment normal.   Review of Systems  Constitutional: Negative.   HENT: Negative.    Eyes: Negative.   Respiratory: Negative.    Cardiovascular: Negative.   Gastrointestinal: Negative.   Genitourinary: Negative.   Musculoskeletal: Negative.   Skin: Negative.   Neurological: Negative.   Endo/Heme/Allergies: Negative.   Psychiatric/Behavioral:  Positive for depression.   Blood pressure (!) 151/61, pulse 77, temperature (!) 97.3 F (36.3  C), temperature source Oral, resp. rate 18, height 5' 2.99" (1.6 m), weight 46.9 kg, SpO2 100 %. Body mass index is 18.34 kg/m.   Treatment Plan Summary: Daily contact with patient to assess and evaluate symptoms and progress in treatment, Medication management, and Plan continue current medications.  Parks Ranger, DO 10/07/2021, 11:54 AM

## 2021-10-07 NOTE — Progress Notes (Signed)
Patient is calm and cooperative, verbalizes sleeping good last night. Patient denies AVH, SI, HI, anxiety or depression. AM medications administered as ordered. Patient has a flat affect and has delayed responses to commands. Patient has incontinent episodes. Emotional support provided. Patient denies pain at this time. Will continue to monitor with Q 15 minutes safety checks. ?  ?

## 2021-10-08 ENCOUNTER — Encounter (INDEPENDENT_AMBULATORY_CARE_PROVIDER_SITE_OTHER): Payer: Self-pay | Admitting: Nurse Practitioner

## 2021-10-08 DIAGNOSIS — F250 Schizoaffective disorder, bipolar type: Secondary | ICD-10-CM | POA: Diagnosis not present

## 2021-10-08 LAB — GLUCOSE, CAPILLARY
Glucose-Capillary: 160 mg/dL — ABNORMAL HIGH (ref 70–99)
Glucose-Capillary: 288 mg/dL — ABNORMAL HIGH (ref 70–99)
Glucose-Capillary: 67 mg/dL — ABNORMAL LOW (ref 70–99)
Glucose-Capillary: 216 mg/dL — ABNORMAL HIGH (ref 70–99)
Glucose-Capillary: 217 mg/dL — ABNORMAL HIGH (ref 70–99)

## 2021-10-08 MED ORDER — OLANZAPINE 5 MG PO TABS
10.0000 mg | ORAL_TABLET | Freq: Every day | ORAL | Status: DC
Start: 2021-10-08 — End: 2021-11-10
  Administered 2021-10-08 – 2021-11-09 (×33): 10 mg via ORAL
  Filled 2021-10-08 (×33): qty 2

## 2021-10-08 NOTE — Plan of Care (Signed)
Patient remain alert and oriented X3 with soft and delayed speech. Continue to require assistance with ADL's. Ambulatory with slow and unsteady gait. Denies pain or discomfort at this time. Denise depression and anxiety. Denies SI, HI, and AVH. Patient continue with incontinent episode, assisted with shower, clean scrubs provided. Complete linen change. Patient encouraged to ask for assistance. Ate breakfast in the day room with good appetite. AM blood sugar 160- 3u of novolog given Patient is currently sitting in the day room among staff and peers in no apparent distress. Q 15 minute safety checks continue.  ?  ?Problem: Education: ?Goal: Knowledge of [place] [facility] Education information/materials will improve ?Outcome: Progressing ?Goal: Emotional status will improve ?Outcome: Progressing ?Goal: Mental status will improve ?Outcome: Progressing ?Goal: Verbalization of understanding the information provided will improve ?Outcome: Progressing ?  ?Problem: Activity: ?Goal: Interest or engagement in activities will improve ?Outcome: Progressing ?Goal: Sleeping patterns will improve ?Outcome: Progressing ?  ?Problem: Coping: ?Goal: Ability to verbalize frustrations and anger appropriately will improve ?Outcome: Progressing ?Goal: Ability to demonstrate self-control will improve ?Outcome: Progressing ?  ?Problem: Health Behavior/Discharge Planning: ?Goal: Identification of resources available to assist in meeting health care needs will improve ?Outcome: Progressing ?Goal: Compliance with treatment plan for underlying cause of condition will improve ?Outcome: Progressing ?  ?Problem: Physical Regulation: ?Goal: Ability to maintain clinical measurements within normal limits will improve ?Outcome: Progressing ?  ?Problem: Safety: ?Goal: Periods of time without injury will increase ?Outcome: Progressing ?  ?

## 2021-10-08 NOTE — Plan of Care (Signed)
°  Problem: Group Participation °Goal: STG - Patient will engage in groups without prompting or encouragement from LRT x3 group sessions within 5 recreation therapy group sessions °Description: STG - Patient will engage in groups without prompting or encouragement from LRT x3 group sessions within 5 recreation therapy group sessions °Outcome: Progressing °  °

## 2021-10-08 NOTE — Progress Notes (Signed)
[facility] MD Progress Note  10/08/2021 10:56 AM Alicia Rogers  MRN:  [id number] Subjective: Alicia Rogers met remains pleasant and cooperative on the unit.  She is taking her medications as prescribed.  She denies any side effects.  She did complain of hearing voices last night.  Fairly, that kept her up.  She is confused at times but is able to answer questions appropriately at others.  Principal Problem: Schizoaffective disorder, bipolar type ([place]) Diagnosis: Principal Problem:   Schizoaffective disorder, bipolar type ([place])  Total Time spent with patient: 15 minutes    Past Medical History:  Past Medical History:  Diagnosis Date   Depression    Diabetes mellitus without complication ([place])    Diabetes mellitus, type II ([place])    Hypertension    Schizophrenia ([place])    History reviewed. No pertinent surgical history. Family History:  Family History  Problem Relation Age of Onset   Diabetes Mother    Glaucoma Mother    Heart disease Mother    Heart attack Mother    Depression Mother    Hypertension Mother    Diabetes Father    Glaucoma Father    Hypertension Father    Diabetes Sister    Depression Sister    Post-traumatic stress disorder Sister     Social History:  Social History   Substance and Sexual Activity  Alcohol Use No   Alcohol/week: 0.0 standard drinks     Social History   Substance and Sexual Activity  Drug Use No    Social History   Socioeconomic History   Marital status: Single    Spouse name: Not on file   Number of children: Not on file   Years of education: Not on file   Highest education level: Not on file  Occupational History   Not on file  Tobacco Use   Smoking status: Every Day    Packs/day: 1.00    Types: Cigarettes    Start date: 04/21/1978   Smokeless tobacco: Never  Vaping Use   Vaping Use: Never used  Substance and Sexual Activity   Alcohol use: No    Alcohol/week: 0.0 standard drinks   Drug use: No   Sexual activity: Not Currently   Other Topics Concern   Not on file  Social History Narrative   Not on file   Social Determinants of Health   Financial Resource Strain: Not on file  Food Insecurity: Not on file  Transportation Needs: Not on file  Physical Activity: Not on file  Stress: Not on file  Social Connections: Not on file   Additional Social History:                         Sleep: Poor  Appetite:  Fair  Current Medications: Current Facility-Administered Medications  Medication Dose Route Frequency Provider Last Rate Last Admin   acetaminophen (TYLENOL) tablet 650 mg  650 mg Oral Q6H PRN Caroline Sauger, NP   650 mg at 10/04/21 2157   alum & mag hydroxide-simeth (MAALOX/MYLANTA) 200-200-20 MG/5ML suspension 30 mL  30 mL Oral Q4H PRN Parks Ranger, DO   30 mL at 10/01/21 2215   amLODipine (NORVASC) tablet 10 mg  10 mg Oral Daily Parks Ranger, DO   10 mg at 10/08/21 0915   cyclobenzaprine (FLEXERIL) tablet 10 mg  10 mg Oral QHS Parks Ranger, DO   10 mg at 10/07/21 2135   escitalopram (LEXAPRO) tablet 10 mg  10 mg Oral Daily Parks Ranger, DO   10 mg at 10/08/21 0915   hydrALAZINE (APRESOLINE) tablet 50 mg  50 mg Oral TID Parks Ranger, DO   50 mg at 10/08/21 0915   hydrochlorothiazide (HYDRODIURIL) tablet 25 mg  25 mg Oral Daily Parks Ranger, DO   25 mg at 10/08/21 8099   insulin aspart (novoLOG) injection 0-15 Units  0-15 Units Subcutaneous TID WC Parks Ranger, DO   3 Units at 10/08/21 8338   insulin aspart (novoLOG) injection 0-5 Units  0-5 Units Subcutaneous QHS Parks Ranger, DO   3 Units at 10/02/21 2131   ondansetron (ZOFRAN) tablet 4 mg  4 mg Oral Q6H PRN Parks Ranger, DO       Or   ondansetron [facility]) injection 4 mg  4 mg Intravenous Q6H PRN Parks Ranger, DO       paliperidone (INVEGA) 24 hr tablet 6 mg  6 mg Oral Daily Parks Ranger, DO   6 mg at 10/08/21 2505    polyethylene glycol (MIRALAX / GLYCOLAX) packet 17 g  17 g Oral Daily PRN Parks Ranger, DO       traZODone (DESYREL) tablet 100 mg  100 mg Oral QHS Parks Ranger, DO   100 mg at 10/07/21 2135    Lab Results:  Results for orders placed or performed during the hospital encounter of 09/29/21 (from the past 48 hour(s))  Glucose, capillary     Status: Abnormal   Collection Time: 10/06/21 11:38 AM  Result Value Ref Range   Glucose-Capillary 164 (H) 70 - 99 mg/dL    Comment: Glucose reference range applies only to samples taken after fasting for at least 8 hours.  Glucose, capillary     Status: Abnormal   Collection Time: 10/06/21  4:53 PM  Result Value Ref Range   Glucose-Capillary 147 (H) 70 - 99 mg/dL    Comment: Glucose reference range applies only to samples taken after fasting for at least 8 hours.  Glucose, capillary     Status: Abnormal   Collection Time: 10/06/21  7:43 PM  Result Value Ref Range   Glucose-Capillary 116 (H) 70 - 99 mg/dL    Comment: Glucose reference range applies only to samples taken after fasting for at least 8 hours.   Comment 1 Notify RN   Glucose, capillary     Status: Abnormal   Collection Time: 10/07/21  8:00 AM  Result Value Ref Range   Glucose-Capillary 152 (H) 70 - 99 mg/dL    Comment: Glucose reference range applies only to samples taken after fasting for at least 8 hours.  Glucose, capillary     Status: Abnormal   Collection Time: 10/07/21 11:38 AM  Result Value Ref Range   Glucose-Capillary 226 (H) 70 - 99 mg/dL    Comment: Glucose reference range applies only to samples taken after fasting for at least 8 hours.  Glucose, capillary     Status: Abnormal   Collection Time: 10/07/21  4:15 PM  Result Value Ref Range   Glucose-Capillary 145 (H) 70 - 99 mg/dL    Comment: Glucose reference range applies only to samples taken after fasting for at least 8 hours.  Glucose, capillary     Status: Abnormal   Collection Time: 10/07/21  8:04  PM  Result Value Ref Range   Glucose-Capillary 142 (H) 70 - 99 mg/dL    Comment: Glucose reference range applies only to samples taken  after fasting for at least 8 hours.   Comment 1 Notify RN   Glucose, capillary     Status: Abnormal   Collection Time: 10/08/21  7:59 AM  Result Value Ref Range   Glucose-Capillary 160 (H) 70 - 99 mg/dL    Comment: Glucose reference range applies only to samples taken after fasting for at least 8 hours.    Blood Alcohol level:  Lab Results  Component Value Date   ETH <10 09/20/2021   ETH <10 86/75/4492    Metabolic Disorder Labs: Lab Results  Component Value Date   HGBA1C 8.0 (H) 09/27/2021   MPG 182.9 09/27/2021   MPG 180.03 10/13/2020   No results found for: PROLACTIN Lab Results  Component Value Date   CHOL 267 (H) 10/13/2020   TRIG 180 (H) 10/13/2020   HDL 86 10/13/2020   CHOLHDL 3.1 10/13/2020   VLDL 36 10/13/2020   LDLCALC 145 (H) 10/13/2020   LDLCALC 158 (H) 02/29/2020    Physical Findings: AIMS:  , ,  ,  ,    CIWA:    COWS:     Musculoskeletal: Strength & Muscle Tone: within normal limits Gait & Station: unsteady Patient leans: N/A  Psychiatric Specialty Exam:  Presentation  General Appearance: Bizarre; Disheveled  Eye Contact:Fair  Speech:Garbled  Speech Volume:Decreased  Handedness:Right   Mood and Affect  Mood:No data recorded Affect:Flat   Thought Process  Thought Processes:Disorganized  Descriptions of Associations:Loose  Orientation:Partial  Thought Content:Illogical  History of Schizophrenia/Schizoaffective disorder:Yes  Duration of Psychotic Symptoms:Greater than six months  Hallucinations:No data recorded Ideas of Reference:No data recorded Suicidal Thoughts:No data recorded Homicidal Thoughts:No data recorded  Sensorium  Memory:Immediate Poor  Judgment:Impaired  Insight:Lacking   Executive Functions  Concentration:Poor  Attention Span:Poor  Recall:Poor  Fund of  Knowledge:Poor  Language:Poor   Psychomotor Activity  Psychomotor Activity:No data recorded  Assets  Assets:Housing; Social Support   Sleep  Sleep:No data recorded   Physical Exam: Physical Exam ROS Blood pressure (!) 159/60, pulse 81, temperature (!) 97.4 F (36.3 C), temperature source Oral, resp. rate 18, height 5' 2.99" (1.6 m), weight 46.9 kg, SpO2 100 %. Body mass index is 18.34 kg/m.   Treatment Plan Summary: Daily contact with patient to assess and evaluate symptoms and progress in treatment, Medication management, and Plan because she continues to have auditory hallucinations, difficulty sleeping, and poor appetite I am going to switch her Invega to Zyprexa at bedtime.  Parks Ranger, DO 10/08/2021, 10:56 AM

## 2021-10-08 NOTE — BHH Counselor (Signed)
CSW contacted and left voicemail for Alicia Rogers, [place], [phone]  regarding openings for nursing homes in the area. CSW will follow up at another time.  ? ?[name], MSW, LCSW-A ?3/9/20234:20 PM  ?

## 2021-10-08 NOTE — Group Note (Signed)
[place] LCSW Group Therapy Note ? ? ?Group Date: 10/08/2021 ?Start Time: 1400 ?End Time: 1440 ? ? ?Type of Therapy/Topic:  Group Therapy:  Balance in Life ? ?Participation Level:  Active  ? ?Description of Group:   ? This group will address the concept of balance and how it feels and looks when one is unbalanced. Patients will be encouraged to process areas in their lives that are out of balance, and identify reasons for remaining unbalanced. Facilitators will guide patients utilizing problem- solving interventions to address and correct the stressor making their life unbalanced. Understanding and applying boundaries will be explored and addressed for obtaining  and maintaining a balanced life. Patients will be encouraged to explore ways to assertively make their unbalanced needs known to significant others in their lives, using other group members and facilitator for support and feedback. ? ?Therapeutic Goals: ?Patient will identify two or more emotions or situations they have that consume much of in their lives. ?Patient will identify signs/triggers that life has become out of balance:  ?Patient will identify two ways to set boundaries in order to achieve balance in their lives:  ?Patient will demonstrate ability to communicate their needs through discussion and/or role plays ? ?Summary of Patient Progress: ? ? ? ?Patient was present for the entirety of the group session. Patient was an active listener and participated in the topic of discussion, provided helpful advice to others, and added nuance to topic of conversation.  CSW discussed pt's coping skills regarding activities pt could continue at home. She stated that she has enjoyed doing puzzles and would like to continue that activity at home. She said drawing was also an enjoyable activity and hoped to do more of that in the future.  ? ? ? ? ?Therapeutic Modalities:   ?Cognitive Behavioral Therapy ?Solution-Focused Therapy ?Assertiveness Training ? ? ?Casmir Auguste A  [place], LCSWA ?

## 2021-10-08 NOTE — BHH Counselor (Signed)
CSW contacted [place],  [phone]  pt's sister regarding discharge planning. CSW left voicemail and will follow up at another time.  ? ?Wake Conlee [place], MSW, LCSW-A ?3/9/20234:18 PM  ?

## 2021-10-08 NOTE — Progress Notes (Addendum)
Recreation Therapy Notes ? ? ?Date: 10/08/2021 ? ?Time: 1:30pm   ? ?Location: Craft room  ? ?Behavioral response: Appropriate ? ?Intervention Topic: Happiness  ? ?Discussion/Intervention:  ?Group content today was focused on Happiness. The group defined happiness and described where happiness comes from. Individuals identified what makes them happy and how they go about making others happy. Patients expressed things that stop them from being happy and ways they can improve their happiness. The group stated reasons why it is important to be happy. The group participated in the intervention ?My Happiness?, where they had a chance to identify and express things that make them happy. ?Clinical Observations/Feedback: ?Patient came to group was focused on what peers and staff had to say about happiness. She stated that being around others is what makes her happy. Individual was social with staff and peers while participating in the intervention.  ?Boneta Standre LRT/CTRS  ? ? ? ? ? ? ? ?Abdulah Iqbal ?10/08/2021 3:17 PM ?

## 2021-10-09 DIAGNOSIS — F250 Schizoaffective disorder, bipolar type: Secondary | ICD-10-CM | POA: Diagnosis not present

## 2021-10-09 LAB — GLUCOSE, CAPILLARY
Glucose-Capillary: 164 mg/dL — ABNORMAL HIGH (ref 70–99)
Glucose-Capillary: 164 mg/dL — ABNORMAL HIGH (ref 70–99)
Glucose-Capillary: 144 mg/dL — ABNORMAL HIGH (ref 70–99)
Glucose-Capillary: 198 mg/dL — ABNORMAL HIGH (ref 70–99)

## 2021-10-09 NOTE — Progress Notes (Signed)
[facility] MD Progress Note  10/09/2021 11:42 AM Alicia Rogers  MRN:  [id number] Subjective: Patient states that she is doing okay.  She has been pleasant and cooperative with staff and peers.  She is taking her medications as prescribed and denies any side effects.  Social work has been working with Protective services to help [employer] find alternative housing.  Pt had a visit from [place] social worker from [employer], [place]. She was following up on CPS report that was filed anonymously  regarding pt's family.    CSW was notified by case worker Robet Leu, [phone], [email or web address] who stated that pt's previous home environment before admission was not safe and that she would be assisting pt in finding alternative housing options.    CSW provided pt's information and other necessary paperwork to assist in housing procurement.    CSW will follow up with  case worker regarding updates on housing as well.   Principal Problem: Schizoaffective disorder, bipolar type ([employer]) Diagnosis: Principal Problem:   Schizoaffective disorder, bipolar type ([place])  Total Time spent with patient: 15 minutes  Past Psychiatric History: [place]  Past Medical History:  Past Medical History:  Diagnosis Date   Depression    Diabetes mellitus without complication ([place])    Diabetes mellitus, type II ([place])    Hypertension    Schizophrenia ([place])    History reviewed. No pertinent surgical history. Family History:  Family History  Problem Relation Age of Onset   Diabetes Mother    Glaucoma Mother    Heart disease Mother    Heart attack Mother    Depression Mother    Hypertension Mother    Diabetes Father    Glaucoma Father    Hypertension Father    Diabetes Sister    Depression Sister    Post-traumatic stress disorder Sister     Social History:  Social History   Substance and Sexual Activity  Alcohol Use No   Alcohol/week: 0.0 standard drinks      Social History   Substance and Sexual Activity  Drug Use No    Social History   Socioeconomic History   Marital status: Single    Spouse name: Not on file   Number of children: Not on file   Years of education: Not on file   Highest education level: Not on file  Occupational History   Not on file  Tobacco Use   Smoking status: Every Day    Packs/day: 1.00    Types: Cigarettes    Start date: 04/21/1978   Smokeless tobacco: Never  Vaping Use   Vaping Use: Never used  Substance and Sexual Activity   Alcohol use: No    Alcohol/week: 0.0 standard drinks   Drug use: No   Sexual activity: Not Currently  Other Topics Concern   Not on file  Social History Narrative   Not on file   Social Determinants of Health   Financial Resource Strain: Not on file  Food Insecurity: Not on file  Transportation Needs: Not on file  Physical Activity: Not on file  Stress: Not on file  Social Connections: Not on file   Additional Social History:                         Sleep: Good  Appetite:  Good  Current Medications: Current Facility-Administered Medications  Medication Dose Route Frequency Provider Last Rate Last Admin   acetaminophen (TYLENOL) tablet 650 mg  650 mg Oral Q6H PRN Caroline Sauger, NP   650 mg at 10/04/21 2157   alum & mag hydroxide-simeth (MAALOX/MYLANTA) 200-200-20 MG/5ML suspension 30 mL  30 mL Oral Q4H PRN Parks Ranger, DO   30 mL at 10/01/21 2215   amLODipine (NORVASC) tablet 10 mg  10 mg Oral Daily Parks Ranger, DO   10 mg at 10/09/21 1011   cyclobenzaprine (FLEXERIL) tablet 10 mg  10 mg Oral QHS Parks Ranger, DO   10 mg at 10/08/21 2147   escitalopram (LEXAPRO) tablet 10 mg  10 mg Oral Daily Parks Ranger, DO   10 mg at 10/09/21 1011   hydrALAZINE (APRESOLINE) tablet 50 mg  50 mg Oral TID Parks Ranger, DO   50 mg at 10/09/21 0756   hydrochlorothiazide (HYDRODIURIL) tablet 25 mg  25 mg Oral  Daily Parks Ranger, DO   25 mg at 10/09/21 1011   insulin aspart (novoLOG) injection 0-15 Units  0-15 Units Subcutaneous TID WC Parks Ranger, DO   3 Units at 10/09/21 0757   insulin aspart (novoLOG) injection 0-5 Units  0-5 Units Subcutaneous QHS Parks Ranger, DO   2 Units at 10/08/21 2158   OLANZapine (ZYPREXA) tablet 10 mg  10 mg Oral QHS Parks Ranger, DO   10 mg at 10/08/21 2147   ondansetron (ZOFRAN) tablet 4 mg  4 mg Oral Q6H PRN Parks Ranger, DO       Or   ondansetron [facility]) injection 4 mg  4 mg Intravenous Q6H PRN Parks Ranger, DO       polyethylene glycol (MIRALAX / GLYCOLAX) packet 17 g  17 g Oral Daily PRN Parks Ranger, DO       traZODone (DESYREL) tablet 100 mg  100 mg Oral QHS Parks Ranger, DO   100 mg at 10/08/21 2147    Lab Results:  Results for orders placed or performed during the hospital encounter of 09/29/21 (from the past 48 hour(s))  Glucose, capillary     Status: Abnormal   Collection Time: 10/07/21  4:15 PM  Result Value Ref Range   Glucose-Capillary 145 (H) 70 - 99 mg/dL    Comment: Glucose reference range applies only to samples taken after fasting for at least 8 hours.  Glucose, capillary     Status: Abnormal   Collection Time: 10/07/21  8:04 PM  Result Value Ref Range   Glucose-Capillary 142 (H) 70 - 99 mg/dL    Comment: Glucose reference range applies only to samples taken after fasting for at least 8 hours.   Comment 1 Notify RN   Glucose, capillary     Status: Abnormal   Collection Time: 10/08/21  7:59 AM  Result Value Ref Range   Glucose-Capillary 160 (H) 70 - 99 mg/dL    Comment: Glucose reference range applies only to samples taken after fasting for at least 8 hours.  Glucose, capillary     Status: Abnormal   Collection Time: 10/08/21 11:36 AM  Result Value Ref Range   Glucose-Capillary 288 (H) 70 - 99 mg/dL    Comment: Glucose reference range applies only to  samples taken after fasting for at least 8 hours.  Glucose, capillary     Status: Abnormal   Collection Time: 10/08/21  4:21 PM  Result Value Ref Range   Glucose-Capillary 67 (L) 70 - 99 mg/dL    Comment: Glucose reference range applies only to samples taken after fasting for  at least 8 hours.  Glucose, capillary     Status: Abnormal   Collection Time: 10/08/21  6:10 PM  Result Value Ref Range   Glucose-Capillary 216 (H) 70 - 99 mg/dL    Comment: Glucose reference range applies only to samples taken after fasting for at least 8 hours.  Glucose, capillary     Status: Abnormal   Collection Time: 10/08/21  9:52 PM  Result Value Ref Range   Glucose-Capillary 217 (H) 70 - 99 mg/dL    Comment: Glucose reference range applies only to samples taken after fasting for at least 8 hours.  Glucose, capillary     Status: Abnormal   Collection Time: 10/09/21  7:44 AM  Result Value Ref Range   Glucose-Capillary 164 (H) 70 - 99 mg/dL    Comment: Glucose reference range applies only to samples taken after fasting for at least 8 hours.    Blood Alcohol level:  Lab Results  Component Value Date   ETH <10 09/20/2021   ETH <10 61/60/7371    Metabolic Disorder Labs: Lab Results  Component Value Date   HGBA1C 8.0 (H) 09/27/2021   MPG 182.9 09/27/2021   MPG 180.03 10/13/2020   No results found for: PROLACTIN Lab Results  Component Value Date   CHOL 267 (H) 10/13/2020   TRIG 180 (H) 10/13/2020   HDL 86 10/13/2020   CHOLHDL 3.1 10/13/2020   VLDL 36 10/13/2020   LDLCALC 145 (H) 10/13/2020   LDLCALC 158 (H) 02/29/2020    Physical Findings: AIMS:  , ,  ,  ,    CIWA:    COWS:     Musculoskeletal: Strength & Muscle Tone: within normal limits Gait & Station: unsteady Patient leans: N/A  Psychiatric Specialty Exam:  Presentation  General Appearance: Bizarre; Disheveled  Eye Contact:Fair  Speech:Garbled  Speech Volume:Decreased  Handedness:Right   Mood and Affect  Mood:No data  recorded Affect:Flat   Thought Process  Thought Processes:Disorganized  Descriptions of Associations:Loose  Orientation:Partial  Thought Content:Illogical  History of Schizophrenia/Schizoaffective disorder:Yes  Duration of Psychotic Symptoms:Greater than six months  Hallucinations:No data recorded Ideas of Reference:No data recorded Suicidal Thoughts:No data recorded Homicidal Thoughts:No data recorded  Sensorium  Memory:Immediate Poor  Judgment:Impaired  Insight:Lacking   Executive Functions  Concentration:Poor  Attention Span:Poor  Recall:Poor  Fund of Knowledge:Poor  Language:Poor   Psychomotor Activity  Psychomotor Activity:No data recorded  Assets  Assets:Housing; Social Support   Sleep  Sleep:No data recorded   Physical Exam: Physical Exam Vitals and nursing note reviewed.  Constitutional:      Appearance: Normal appearance. She is normal weight.  Neurological:     General: No focal deficit present.     Mental Status: She is alert and oriented to person, place, and time.  Psychiatric:        Attention and Perception: Attention and perception normal.        Mood and Affect: Mood normal. Affect is flat.        Speech: Speech is delayed and tangential.        Behavior: Behavior normal. Behavior is cooperative.        Thought Content: Thought content normal.        Cognition and Memory: Memory normal. Cognition is impaired.        Judgment: Judgment normal.   Review of Systems  Constitutional: Negative.   HENT: Negative.    Eyes: Negative.   Respiratory: Negative.    Cardiovascular: Negative.   Gastrointestinal: Negative.  Genitourinary: Negative.   Musculoskeletal: Negative.   Skin: Negative.   Neurological: Negative.   Endo/Heme/Allergies: Negative.   Psychiatric/Behavioral: Negative.    Blood pressure (!) 173/62, pulse 77, temperature 97.9 F (36.6 C), temperature source Oral, resp. rate 18, height 5' 2.99" (1.6 m), weight 46.9  kg, SpO2 100 %. Body mass index is 18.34 kg/m.   Treatment Plan Summary: Daily contact with patient to assess and evaluate symptoms and progress in treatment, Medication management, and Plan continue current medications.  Parks Ranger, DO 10/09/2021, 11:42 AM

## 2021-10-09 NOTE — Progress Notes (Signed)
Pt sitting in day room; calm, cooperative. Pt states "I'm doing a little better." She c/o L hand pain, which she rates 7-8 on 0-10 pain scale. Upon inspection, pt left hand swollen with 1+ pitting edema noted. She denies SI/HI/AVH, anxiety and depression at this time. She states that she slept "pretty good" last night. She describes her appetite as "not that good today". No acute distress noted. ?

## 2021-10-09 NOTE — BHH Group Notes (Signed)
[facility] Group Notes:  (Nursing/MHT/Case Management/Adjunct) ? ?Date:  10/09/2021  ?Time:  10:30 AM ? ?Type of Therapy:  Group Therapy ? ?Participation Level:  Minimal ? ?Participation Quality:  Attentive ? ?Affect:  Flat ? ?Cognitive:  Lacking ? ?Insight:  Lacking ? ?Engagement in Group:  Poor ? ?Modes of Intervention:  Discussion ? ?Summary of Progress/Problems: ?Pt. Attended group, however barely followed along with handout and did not participate in group discussion. ?Juliette Alcide ?10/09/2021, 2:27 PM ?

## 2021-10-09 NOTE — Progress Notes (Signed)
Recreation Therapy Notes ? ?Date: 10/09/2021 ? ?Time: 1:30 pm   ? ?Location: Craft room   ? ?Behavioral response: Appropriate ? ?Intervention Topic: Self-care   ? ?Discussion/Intervention:  ?Group content today was focused on Self-Care. The group defined self-care and some positive ways they care for themselves. Individuals expressed ways and reasons why they neglected any self-care in the past. Patients described ways to improve self-care in the future. The group explained what could happen if they did not do any self-care activities at all. The group participated in the intervention ?self-care assessment? where they had a chance to discover some of their weaknesses and strengths in self- care. Patient came up with a self-care plan to improve themselves in the future.  ?Clinical Observations/Feedback: ?Patient came to group late and  was focused on what peers and staff had to say about self-care. Individual was social with staff and peers while participating in the intervention.  ?Marietta Sikkema LRT/CTRS  ? ? ? ? ? ? ? ?Melanny Wire ?10/09/2021 4:13 PM ?

## 2021-10-09 NOTE — BHH Counselor (Signed)
CSW contacted the following places for placement and fax information. Social Worker Harrington Challenger stated places had openings and needed referral information.  ? ?[facility], [phone] ?- Left voicemail for Hilda Blades, admissions coordinator. ? ?[facility]-  [phone] ?- No answer, will contact again next week.  ? ?Bunny Lowdermilk [place], MSW, LCSW-A ?3/10/20233:47 PM  ? ? ?

## 2021-10-09 NOTE — Plan of Care (Signed)
Patient is alert and oriented, calm, cooperative and compliant during assessment. Denies anxiety, depression, SI, HI, AVH. Compliant with medications. Ate meals in the day room with good appetite and tolerated well. Denies pain or discomfort at this time. Remain safe on the unit with Q 15 minute safety check.  ? ?Problem: Education: ?Goal: Knowledge of [facility] Education information/materials will improve ?Outcome: Progressing ?Goal: Emotional status will improve ?Outcome: Progressing ?Goal: Mental status will improve ?Outcome: Progressing ?Goal: Verbalization of understanding the information provided will improve ?Outcome: Progressing ?  ?Problem: Activity: ?Goal: Interest or engagement in activities will improve ?Outcome: Progressing ?Goal: Sleeping patterns will improve ?Outcome: Progressing ?  ?Problem: Coping: ?Goal: Ability to verbalize frustrations and anger appropriately will improve ?Outcome: Progressing ?Goal: Ability to demonstrate self-control will improve ?Outcome: Progressing ?  ?Problem: Health Behavior/Discharge Planning: ?Goal: Identification of resources available to assist in meeting health care needs will improve ?Outcome: Progressing ?Goal: Compliance with treatment plan for underlying cause of condition will improve ?Outcome: Progressing ?  ?Problem: Physical Regulation: ?Goal: Ability to maintain clinical measurements within normal limits will improve ?Outcome: Progressing ?  ?Problem: Safety: ?Goal: Periods of time without injury will increase ?Outcome: Progressing ?  ?

## 2021-10-09 NOTE — BHH Counselor (Signed)
Pt had a visit from [place] social worker from [employer], [place]. She was following up on CPS report that was filed anonymously  regarding pt's family.  ? ?CSW was notified by case worker Robet Leu, [phone], [email or web address] who stated that pt's previous home environment before admission was not safe and that she would be assisting pt in finding alternative housing options.  ? ?CSW provided pt's information and other necessary paperwork to assist in housing procurement.  ? ?CSW will follow up with  case worker regarding updates on housing as well.  ? ?Alicia Rogers, MSW, LCSW-A ?3/10/202311:34 AM  ?

## 2021-10-10 DIAGNOSIS — F250 Schizoaffective disorder, bipolar type: Secondary | ICD-10-CM | POA: Diagnosis not present

## 2021-10-10 LAB — GLUCOSE, CAPILLARY
Glucose-Capillary: 149 mg/dL — ABNORMAL HIGH (ref 70–99)
Glucose-Capillary: 159 mg/dL — ABNORMAL HIGH (ref 70–99)
Glucose-Capillary: 211 mg/dL — ABNORMAL HIGH (ref 70–99)
Glucose-Capillary: 140 mg/dL — ABNORMAL HIGH (ref 70–99)

## 2021-10-10 NOTE — Group Note (Signed)
LCSW Group Therapy Note ? ?Group Date: 10/10/2021 ?Start Time: 1315 ?End Time: 5361 ? ? ?Type of Therapy and Topic:  Group Therapy - Healthy vs Unhealthy Coping Skills ? ?Participation Level:  Did Not Attend  ? ?Description of Group ?The focus of this group was to determine what unhealthy coping techniques typically are used by group members and what healthy coping techniques would be helpful in coping with various problems. Patients were guided in becoming aware of the differences between healthy and unhealthy coping techniques. Patients were asked to identify 2-3 healthy coping skills they would like to learn to use more effectively. ? ?Therapeutic Goals ?Patients learned that coping is what human beings do all day long to deal with various situations in their lives ?Patients defined and discussed healthy vs unhealthy coping techniques ?Patients identified their preferred coping techniques and identified whether these were healthy or unhealthy ?Patients determined 2-3 healthy coping skills they would like to become more familiar with and use more often. ?Patients provided support and ideas to each other ? ? ?Summary of Patient Progress:  Due to limited staffing, group was not held on the unit.  ? ?Therapeutic Modalities ?Cognitive Behavioral Therapy ?Motivational Interviewing ? ?Kenna Gilbert Chamari [place], LCSWA ?10/10/2021  5:35 PM   ?

## 2021-10-10 NOTE — BH IP Treatment Plan (Signed)
Interdisciplinary Treatment and Diagnostic Plan Update  10/10/2021 Time of Session: 10:00AM Alicia Rogers MRN: [id number]  Principal Diagnosis: Schizoaffective disorder, bipolar type ([place])  Secondary Diagnoses: Principal Problem:   Schizoaffective disorder, bipolar type ([place])   Current Medications:  Current Facility-Administered Medications  Medication Dose Route Frequency Provider Last Rate Last Admin   acetaminophen (TYLENOL) tablet 650 mg  650 mg Oral Q6H PRN Caroline Sauger, NP   650 mg at 10/04/21 2157   alum & mag hydroxide-simeth (MAALOX/MYLANTA) 200-200-20 MG/5ML suspension 30 mL  30 mL Oral Q4H PRN Parks Ranger, DO   30 mL at 10/01/21 2215   amLODipine (NORVASC) tablet 10 mg  10 mg Oral Daily Parks Ranger, DO   10 mg at 10/10/21 0946   cyclobenzaprine (FLEXERIL) tablet 10 mg  10 mg Oral QHS Parks Ranger, DO   10 mg at 10/09/21 2128   escitalopram (LEXAPRO) tablet 10 mg  10 mg Oral Daily Parks Ranger, DO   10 mg at 10/10/21 0946   hydrALAZINE (APRESOLINE) tablet 50 mg  50 mg Oral TID Parks Ranger, DO   50 mg at 10/10/21 0750   hydrochlorothiazide (HYDRODIURIL) tablet 25 mg  25 mg Oral Daily Parks Ranger, DO   25 mg at 10/10/21 0946   insulin aspart (novoLOG) injection 0-15 Units  0-15 Units Subcutaneous TID WC Parks Ranger, DO   2 Units at 10/09/21 1700   insulin aspart (novoLOG) injection 0-5 Units  0-5 Units Subcutaneous QHS Parks Ranger, DO   2 Units at 10/10/21 0746   OLANZapine (ZYPREXA) tablet 10 mg  10 mg Oral QHS Parks Ranger, DO   10 mg at 10/09/21 2128   ondansetron (ZOFRAN) tablet 4 mg  4 mg Oral Q6H PRN Parks Ranger, DO       Or   ondansetron [facility]) injection 4 mg  4 mg Intravenous Q6H PRN Parks Ranger, DO       polyethylene glycol (MIRALAX / GLYCOLAX) packet 17 g  17 g Oral Daily PRN Parks Ranger, DO       traZODone (DESYREL)  tablet 100 mg  100 mg Oral QHS Parks Ranger, DO   100 mg at 10/09/21 2128   PTA Medications: Medications Prior to Admission  Medication Sig Dispense Refill Last Dose   amLODipine (NORVASC) 10 MG tablet Take 1 tablet (10 mg total) by mouth daily.      [EXPIRED] cefdinir (OMNICEF) 300 MG capsule Take 1 capsule (300 mg total) by mouth daily for 3 days. 3 capsule 0    hydrALAZINE (APRESOLINE) 50 MG tablet Take 1 tablet (50 mg total) by mouth 3 (three) times daily.      hydrochlorothiazide (HYDRODIURIL) 12.5 MG tablet Take 1 tablet (12.5 mg total) by mouth daily.       Patient Stressors: Health problems    Patient Strengths: Religious Affiliation   Treatment Modalities: Medication Management, Group therapy, Case management,  1 to 1 session with clinician, Psychoeducation, Recreational therapy.   Physician Treatment Plan for Primary Diagnosis: Schizoaffective disorder, bipolar type ([place]) Long Term Goal(s): Improvement in symptoms so as ready for discharge   Short Term Goals: Ability to identify changes in lifestyle to reduce recurrence of condition will improve Ability to verbalize feelings will improve Ability to disclose and discuss suicidal ideas Ability to demonstrate self-control will improve Ability to identify and develop effective coping behaviors will improve Ability to maintain clinical measurements within normal limits will  improve Compliance with prescribed medications will improve Ability to identify triggers associated with substance abuse/mental health issues will improve  Medication Management: Evaluate patient's response, side effects, and tolerance of medication regimen.  Therapeutic Interventions: 1 to 1 sessions, Unit Group sessions and Medication administration.  Evaluation of Outcomes: Progressing  Physician Treatment Plan for Secondary Diagnosis: Principal Problem:   Schizoaffective disorder, bipolar type ([place])  Long Term Goal(s): Improvement in  symptoms so as ready for discharge   Short Term Goals: Ability to identify changes in lifestyle to reduce recurrence of condition will improve Ability to verbalize feelings will improve Ability to disclose and discuss suicidal ideas Ability to demonstrate self-control will improve Ability to identify and develop effective coping behaviors will improve Ability to maintain clinical measurements within normal limits will improve Compliance with prescribed medications will improve Ability to identify triggers associated with substance abuse/mental health issues will improve     Medication Management: Evaluate patient's response, side effects, and tolerance of medication regimen.  Therapeutic Interventions: 1 to 1 sessions, Unit Group sessions and Medication administration.  Evaluation of Outcomes: Progressing   RN Treatment Plan for Primary Diagnosis: Schizoaffective disorder, bipolar type ([place]) Long Term Goal(s): Knowledge of disease and therapeutic regimen to maintain health will improve  Short Term Goals: Ability to remain free from injury will improve, Ability to verbalize frustration and anger appropriately will improve, Ability to demonstrate self-control, Ability to participate in decision making will improve, Ability to verbalize feelings will improve, Ability to identify and develop effective coping behaviors will improve, and Compliance with prescribed medications will improve  Medication Management: RN will administer medications as ordered by provider, will assess and evaluate patient's response and provide education to patient for prescribed medication. RN will report any adverse and/or side effects to prescribing provider.  Therapeutic Interventions: 1 on 1 counseling sessions, Psychoeducation, Medication administration, Evaluate responses to treatment, Monitor vital signs and CBGs as ordered, Perform/monitor CIWA, COWS, AIMS and Fall Risk screenings as ordered, Perform wound care  treatments as ordered.  Evaluation of Outcomes: Progressing   LCSW Treatment Plan for Primary Diagnosis: Schizoaffective disorder, bipolar type ([place]) Long Term Goal(s): Safe transition to appropriate next level of care at discharge, Engage patient in therapeutic group addressing interpersonal concerns.  Short Term Goals: Engage patient in aftercare planning with referrals and resources, Increase social support, Increase ability to appropriately verbalize feelings, Increase emotional regulation, Facilitate acceptance of mental health diagnosis and concerns, Identify triggers associated with mental health/substance abuse issues, and Increase skills for wellness and recovery  Therapeutic Interventions: Assess for all discharge needs, 1 to 1 time with Social worker, Explore available resources and support systems, Assess for adequacy in community support network, Educate family and significant other(s) on suicide prevention, Complete Psychosocial Assessment, Interpersonal group therapy.  Evaluation of Outcomes: Progressing   Progress in Treatment: Attending groups: Yes. Participating in groups: Yes. Taking medication as prescribed: Yes. Toleration medication: Yes. Family/Significant other contact made: Yes, individual(s) contacted:  SPE completed with patient's sister.  Patient understands diagnosis: No. Discussing patient identified problems/goals with staff: Yes. Medical problems stabilized or resolved: Yes. Denies suicidal/homicidal ideation: Yes. Issues/concerns per patient self-inventory: No. Other: None.  New problem(s) identified: No, Describe:  None.   New Short Term/Long Term Goal(s): Patient to work towards elimination of symptoms of psychosis, medication management for mood stabilization;  development of comprehensive mental wellness plan. Update 10/05/21: No changes at this time. Update 10/10/2021: No changes at this time.   Patient Goals:  "to get better  and taking my  medication" Update 10/05/21: No changes at this time Update 10/10/2021: No changes at this time.   Discharge Plan or Barriers: CSW will assist pt with development of appropriate discharge/aftercare plan. Update 10/05/21: CSW discussed housing options, CSW will send referrals to family care homes per pt's interest. Pt will follow up with family regarding ability to return back home. Update 10/10/2021: Per DSS caseworker, patient's previous home environment prior to admission was unsafe. CSW to continue to send referrals and  follow up on family care homes per patient's interest.   Reason for Continuation of Hospitalization: Hallucinations Medication stabilization  Estimated Length of Stay: TBD   Scribe for Treatment Team: Sherilyn Dacosta 10/10/2021 10:04 AM

## 2021-10-10 NOTE — Progress Notes (Signed)
Pt was medication compliant.  Her responses are delayed.  She was demanding throughout the day on the unit.  She came to the dayroom after lunch.  Pt denies SI, HI, or AVH.  She remains safe on the unit at this time. Q 15 min safety checks were in place.  ?

## 2021-10-10 NOTE — Progress Notes (Signed)
Pt is A&O x4. Pt is observed interacting with others appropriately. Sometimes her responses are delayed. Pt denies SI, HI, AVH, anxiety, and depression. She is medication complaint. She remains safe on the unit at this time. Q 15 min safety checks put in place.  ?

## 2021-10-10 NOTE — Progress Notes (Signed)
[facility] MD Progress Note  10/10/2021 1:17 PM Alicia Rogers  MRN:  [id number] Subjective: Follow-up for this 61 year old woman with schizoaffective disorder.  Patient seen and chart reviewed.  Nursing team had no acute concerns.  Patient was in bed eyes open but minimally reactive.  Did not have any complaints.  Did not have anything really to say or discuss with me.  Looks somewhat withdrawn.  Vitals are stable.  No new labs.  Plan at this point appears to be largely focused on placement Principal Problem: Schizoaffective disorder, bipolar type ([place]) Diagnosis: Principal Problem:   Schizoaffective disorder, bipolar type ([place])  Total Time spent with patient: 30 minutes  Past Psychiatric History: See previous  Past Medical History:  Past Medical History:  Diagnosis Date   Depression    Diabetes mellitus without complication ([place])    Diabetes mellitus, type II ([place])    Hypertension    Schizophrenia ([place])    History reviewed. No pertinent surgical history. Family History:  Family History  Problem Relation Age of Onset   Diabetes Mother    Glaucoma Mother    Heart disease Mother    Heart attack Mother    Depression Mother    Hypertension Mother    Diabetes Father    Glaucoma Father    Hypertension Father    Diabetes Sister    Depression Sister    Post-traumatic stress disorder Sister    Family Psychiatric  History: See previous Social History:  Social History   Substance and Sexual Activity  Alcohol Use No   Alcohol/week: 0.0 standard drinks     Social History   Substance and Sexual Activity  Drug Use No    Social History   Socioeconomic History   Marital status: Single    Spouse name: Not on file   Number of children: Not on file   Years of education: Not on file   Highest education level: Not on file  Occupational History   Not on file  Tobacco Use   Smoking status: Every Day    Packs/day: 1.00    Types: Cigarettes    Start date: 04/21/1978   Smokeless  tobacco: Never  Vaping Use   Vaping Use: Never used  Substance and Sexual Activity   Alcohol use: No    Alcohol/week: 0.0 standard drinks   Drug use: No   Sexual activity: Not Currently  Other Topics Concern   Not on file  Social History Narrative   Not on file   Social Determinants of Health   Financial Resource Strain: Not on file  Food Insecurity: Not on file  Transportation Needs: Not on file  Physical Activity: Not on file  Stress: Not on file  Social Connections: Not on file   Additional Social History:                         Sleep: Fair  Appetite:  Fair  Current Medications: Current Facility-Administered Medications  Medication Dose Route Frequency Provider Last Rate Last Admin   acetaminophen (TYLENOL) tablet 650 mg  650 mg Oral Q6H PRN Caroline Sauger, NP   650 mg at 10/04/21 2157   alum & mag hydroxide-simeth (MAALOX/MYLANTA) 200-200-20 MG/5ML suspension 30 mL  30 mL Oral Q4H PRN Parks Ranger, DO   30 mL at 10/01/21 2215   amLODipine (NORVASC) tablet 10 mg  10 mg Oral Daily Parks Ranger, DO   10 mg at 10/10/21 0946   cyclobenzaprine (  FLEXERIL) tablet 10 mg  10 mg Oral QHS Parks Ranger, DO   10 mg at 10/09/21 2128   escitalopram (LEXAPRO) tablet 10 mg  10 mg Oral Daily Parks Ranger, DO   10 mg at 10/10/21 3299   hydrALAZINE (APRESOLINE) tablet 50 mg  50 mg Oral TID Parks Ranger, DO   50 mg at 10/10/21 0750   hydrochlorothiazide (HYDRODIURIL) tablet 25 mg  25 mg Oral Daily Parks Ranger, DO   25 mg at 10/10/21 0946   insulin aspart (novoLOG) injection 0-15 Units  0-15 Units Subcutaneous TID WC Parks Ranger, DO   2 Units at 10/10/21 0800   insulin aspart (novoLOG) injection 0-5 Units  0-5 Units Subcutaneous QHS Parks Ranger, DO   2 Units at 10/10/21 0746   OLANZapine (ZYPREXA) tablet 10 mg  10 mg Oral QHS Parks Ranger, DO   10 mg at 10/09/21 2128    ondansetron (ZOFRAN) tablet 4 mg  4 mg Oral Q6H PRN Parks Ranger, DO       Or   ondansetron [facility]) injection 4 mg  4 mg Intravenous Q6H PRN Parks Ranger, DO       polyethylene glycol (MIRALAX / GLYCOLAX) packet 17 g  17 g Oral Daily PRN Parks Ranger, DO       traZODone (DESYREL) tablet 100 mg  100 mg Oral QHS Parks Ranger, DO   100 mg at 10/09/21 2128    Lab Results:  Results for orders placed or performed during the hospital encounter of 09/29/21 (from the past 48 hour(s))  Glucose, capillary     Status: Abnormal   Collection Time: 10/08/21  4:21 PM  Result Value Ref Range   Glucose-Capillary 67 (L) 70 - 99 mg/dL    Comment: Glucose reference range applies only to samples taken after fasting for at least 8 hours.  Glucose, capillary     Status: Abnormal   Collection Time: 10/08/21  6:10 PM  Result Value Ref Range   Glucose-Capillary 216 (H) 70 - 99 mg/dL    Comment: Glucose reference range applies only to samples taken after fasting for at least 8 hours.  Glucose, capillary     Status: Abnormal   Collection Time: 10/08/21  9:52 PM  Result Value Ref Range   Glucose-Capillary 217 (H) 70 - 99 mg/dL    Comment: Glucose reference range applies only to samples taken after fasting for at least 8 hours.  Glucose, capillary     Status: Abnormal   Collection Time: 10/09/21  7:44 AM  Result Value Ref Range   Glucose-Capillary 164 (H) 70 - 99 mg/dL    Comment: Glucose reference range applies only to samples taken after fasting for at least 8 hours.  Glucose, capillary     Status: Abnormal   Collection Time: 10/09/21 11:48 AM  Result Value Ref Range   Glucose-Capillary 164 (H) 70 - 99 mg/dL    Comment: Glucose reference range applies only to samples taken after fasting for at least 8 hours.  Glucose, capillary     Status: Abnormal   Collection Time: 10/09/21  4:30 PM  Result Value Ref Range   Glucose-Capillary 144 (H) 70 - 99 mg/dL    Comment:  Glucose reference range applies only to samples taken after fasting for at least 8 hours.  Glucose, capillary     Status: Abnormal   Collection Time: 10/09/21  7:50 PM  Result Value Ref Range  Glucose-Capillary 198 (H) 70 - 99 mg/dL    Comment: Glucose reference range applies only to samples taken after fasting for at least 8 hours.  Glucose, capillary     Status: Abnormal   Collection Time: 10/10/21  7:40 AM  Result Value Ref Range   Glucose-Capillary 149 (H) 70 - 99 mg/dL    Comment: Glucose reference range applies only to samples taken after fasting for at least 8 hours.  Glucose, capillary     Status: Abnormal   Collection Time: 10/10/21  1:03 PM  Result Value Ref Range   Glucose-Capillary 159 (H) 70 - 99 mg/dL    Comment: Glucose reference range applies only to samples taken after fasting for at least 8 hours.   Comment 1 Notify RN     Blood Alcohol level:  Lab Results  Component Value Date   ETH <10 09/20/2021   ETH <10 09/38/1829    Metabolic Disorder Labs: Lab Results  Component Value Date   HGBA1C 8.0 (H) 09/27/2021   MPG 182.9 09/27/2021   MPG 180.03 10/13/2020   No results found for: PROLACTIN Lab Results  Component Value Date   CHOL 267 (H) 10/13/2020   TRIG 180 (H) 10/13/2020   HDL 86 10/13/2020   CHOLHDL 3.1 10/13/2020   VLDL 36 10/13/2020   LDLCALC 145 (H) 10/13/2020   LDLCALC 158 (H) 02/29/2020    Physical Findings: AIMS:  , ,  ,  ,    CIWA:    COWS:     Musculoskeletal: Strength & Muscle Tone: within normal limits Gait & Station: normal Patient leans: N/A  Psychiatric Specialty Exam:  Presentation  General Appearance: Bizarre; Disheveled  Eye Contact:Fair  Speech:Garbled  Speech Volume:Decreased  Handedness:Right   Mood and Affect  Mood:No data recorded Affect:Flat   Thought Process  Thought Processes:Disorganized  Descriptions of Associations:Loose  Orientation:Partial  Thought Content:Illogical  History of  Schizophrenia/Schizoaffective disorder:Yes  Duration of Psychotic Symptoms:Greater than six months  Hallucinations:No data recorded Ideas of Reference:No data recorded Suicidal Thoughts:No data recorded Homicidal Thoughts:No data recorded  Sensorium  Memory:Immediate Poor  Judgment:Impaired  Insight:Lacking   Executive Functions  Concentration:Poor  Attention Span:Poor  Recall:Poor  Fund of Knowledge:Poor  Language:Poor   Psychomotor Activity  Psychomotor Activity:No data recorded  Assets  Assets:Housing; Social Support   Sleep  Sleep:No data recorded   Physical Exam: Physical Exam Vitals and nursing note reviewed.  Constitutional:      Appearance: Normal appearance.  HENT:     Head: Normocephalic and atraumatic.     Mouth/Throat:     Pharynx: Oropharynx is clear.  Eyes:     Pupils: Pupils are equal, round, and reactive to light.  Cardiovascular:     Rate and Rhythm: Normal rate and regular rhythm.  Pulmonary:     Effort: Pulmonary effort is normal.     Breath sounds: Normal breath sounds.  Abdominal:     General: Abdomen is flat.     Palpations: Abdomen is soft.  Musculoskeletal:        General: Normal range of motion.  Skin:    General: Skin is warm and dry.  Neurological:     General: No focal deficit present.     Mental Status: She is alert. Mental status is at baseline.  Psychiatric:        Attention and Perception: She is inattentive.        Mood and Affect: Affect is blunt.        Speech: She  is noncommunicative.   Review of Systems  Unable to perform ROS: Psychiatric disorder  Blood pressure (!) 166/60, pulse 75, temperature 97.8 F (36.6 C), temperature source Oral, resp. rate 18, height 5' 2.99" (1.6 m), weight 46.9 kg, SpO2 98 %. Body mass index is 18.34 kg/m.   Treatment Plan Summary: Medication management and Plan not sure why she was withdrawn today.  No concerns had been noted recently that I can see.  Encourage patient to  try and get up and come out to the day room at times.  Encouraged her to let us know if she has any new complaints.  We will follow up tomorrow.  Alethia Berthold, MD 10/10/2021, 1:17 PM

## 2021-10-11 DIAGNOSIS — F250 Schizoaffective disorder, bipolar type: Secondary | ICD-10-CM | POA: Diagnosis not present

## 2021-10-11 LAB — GLUCOSE, CAPILLARY
Glucose-Capillary: 146 mg/dL — ABNORMAL HIGH (ref 70–99)
Glucose-Capillary: 221 mg/dL — ABNORMAL HIGH (ref 70–99)
Glucose-Capillary: 142 mg/dL — ABNORMAL HIGH (ref 70–99)
Glucose-Capillary: 151 mg/dL — ABNORMAL HIGH (ref 70–99)

## 2021-10-11 LAB — CREATININE, SERUM
Creatinine, Ser: 2.43 mg/dL — ABNORMAL HIGH (ref 0.44–1.00)
GFR, Estimated: 22 mL/min — ABNORMAL LOW (ref >60)

## 2021-10-11 MED ORDER — GLUCERNA SHAKE PO LIQD
237.0000 mL | Freq: Three times a day (TID) | ORAL | Status: DC
Start: 2021-10-11 — End: 2021-11-10
  Administered 2021-10-11 – 2021-11-10 (×88): 237 mL via ORAL

## 2021-10-11 NOTE — Group Note (Signed)
[facility] LCSW Group Therapy Note ? ? ?Group Date: 10/11/2021 ?Start Time: 1315 ?End Time: 4801 ? ? ?Type of Therapy/Topic:  Group Therapy:  Emotion Regulation ? ?Participation Level:  Did Not Attend  ? ?Mood: ? ?Description of Group:   ? The purpose of this group is to assist patients in learning to regulate negative emotions and experience positive emotions. Patients will be guided to discuss ways in which they have been vulnerable to their negative emotions. These vulnerabilities will be juxtaposed with experiences of positive emotions or situations, and patients challenged to use positive emotions to combat negative ones. Special emphasis will be placed on coping with negative emotions in conflict situations, and patients will process healthy conflict resolution skills. ? ?Therapeutic Goals: ?Patient will identify two positive emotions or experiences to reflect on in order to balance out negative emotions:  ?Patient will label two or more emotions that they find the most difficult to experience:  ?Patient will be able to demonstrate positive conflict resolution skills through discussion or role plays:  ? ?Summary of Patient Progress: Patient was observed eating lunch during the start of group. Patient was invited to attend once she finished eating; however, patient declined to participate. ? ? ? ? ?Therapeutic Modalities:   ?Cognitive Behavioral Therapy ?Feelings Identification ?Dialectical Behavioral Therapy ? ? ?Kenna Gilbert Landrie Beale, LCSWA ?

## 2021-10-11 NOTE — Plan of Care (Signed)
Patient presents A&O x4 with flat affect.  Denies AVH, SI, HI or pain.  Compliant with all evening meds.  Spent most of evening in bed except for snack in day room.  Ambulating independent with steady gait.  Denies needs or complaints.  Will continue to monitor with Q 15 min safey checks. ? ?Problem: Education: ?Goal: Emotional status will improve ?Outcome: Progressing ?Goal: Mental status will improve ?Outcome: Progressing ?  ?Problem: Activity: ?Goal: Interest or engagement in activities will improve ?Outcome: Progressing ?  ?Problem: Health Behavior/Discharge Planning: ?Goal: Compliance with treatment plan for underlying cause of condition will improve ?Outcome: Progressing ?  ?

## 2021-10-11 NOTE — Progress Notes (Signed)
[facility] MD Progress Note  10/11/2021 2:06 PM Alicia Rogers  MRN:  [id number] Subjective: Follow-up 61 year old woman with schizoaffective disorder.  Patient was up out of her room sitting at lunch.  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  No psychiatric complaints.  Wanting to know when she is going to be discharged.  She also requested having Glucerna shakes added because she cannot eat all of her diet. Principal Problem: Schizoaffective disorder, bipolar type ([place]) Diagnosis: Principal Problem:   Schizoaffective disorder, bipolar type ([place])  Total Time spent with patient: 30 minutes  Past Psychiatric History: History of a diagnosis of schizoaffective disorder chronic disability  Past Medical History:  Past Medical History:  Diagnosis Date   Depression    Diabetes mellitus without complication ([place])    Diabetes mellitus, type II ([place])    Hypertension    Schizophrenia ([place])    History reviewed. No pertinent surgical history. Family History:  Family History  Problem Relation Age of Onset   Diabetes Mother    Glaucoma Mother    Heart disease Mother    Heart attack Mother    Depression Mother    Hypertension Mother    Diabetes Father    Glaucoma Father    Hypertension Father    Diabetes Sister    Depression Sister    Post-traumatic stress disorder Sister    Family Psychiatric  History: See previous Social History:  Social History   Substance and Sexual Activity  Alcohol Use No   Alcohol/week: 0.0 standard drinks     Social History   Substance and Sexual Activity  Drug Use No    Social History   Socioeconomic History   Marital status: Single    Spouse name: Not on file   Number of children: Not on file   Years of education: Not on file   Highest education level: Not on file  Occupational History   Not on file  Tobacco Use   Smoking status: Every Day    Packs/day: 1.00    Types: Cigarettes    Start date:  04/21/1978   Smokeless tobacco: Never  Vaping Use   Vaping Use: Never used  Substance and Sexual Activity   Alcohol use: No    Alcohol/week: 0.0 standard drinks   Drug use: No   Sexual activity: Not Currently  Other Topics Concern   Not on file  Social History Narrative   Not on file   Social Determinants of Health   Financial Resource Strain: Not on file  Food Insecurity: Not on file  Transportation Needs: Not on file  Physical Activity: Not on file  Stress: Not on file  Social Connections: Not on file   Additional Social History:                         Sleep: Fair  Appetite:  Fair  Current Medications: Current Facility-Administered Medications  Medication Dose Route Frequency Provider Last Rate Last Admin   acetaminophen (TYLENOL) tablet 650 mg  650 mg Oral Q6H PRN Caroline Sauger, NP   650 mg at 10/04/21 2157   alum & mag hydroxide-simeth (MAALOX/MYLANTA) 200-200-20 MG/5ML suspension 30 mL  30 mL Oral Q4H PRN Parks Ranger, DO   30 mL at 10/01/21 2215   amLODipine (NORVASC) tablet 10 mg  10 mg Oral Daily Parks Ranger, DO   10 mg at 10/11/21 [phone]  cyclobenzaprine (FLEXERIL) tablet 10 mg  10 mg Oral QHS Parks Ranger, DO   10 mg at 10/10/21 2117   escitalopram (LEXAPRO) tablet 10 mg  10 mg Oral Daily Parks Ranger, DO   10 mg at 10/11/21 1275   feeding supplement (GLUCERNA SHAKE) (GLUCERNA SHAKE) liquid 237 mL  237 mL Oral TID BM Jaxson Anglin T, MD       hydrALAZINE (APRESOLINE) tablet 50 mg  50 mg Oral TID Parks Ranger, DO   50 mg at 10/11/21 1329   hydrochlorothiazide (HYDRODIURIL) tablet 25 mg  25 mg Oral Daily Parks Ranger, DO   25 mg at 10/11/21 1700   insulin aspart (novoLOG) injection 0-15 Units  0-15 Units Subcutaneous TID WC Parks Ranger, DO   5 Units at 10/11/21 1218   insulin aspart (novoLOG) injection 0-5 Units  0-5 Units Subcutaneous QHS Parks Ranger, DO   2  Units at 10/10/21 0746   OLANZapine (ZYPREXA) tablet 10 mg  10 mg Oral QHS Parks Ranger, DO   10 mg at 10/10/21 2117   ondansetron (ZOFRAN) tablet 4 mg  4 mg Oral Q6H PRN Parks Ranger, DO       Or   ondansetron [facility]) injection 4 mg  4 mg Intravenous Q6H PRN Parks Ranger, DO       polyethylene glycol (MIRALAX / GLYCOLAX) packet 17 g  17 g Oral Daily PRN Parks Ranger, DO       traZODone (DESYREL) tablet 100 mg  100 mg Oral QHS Parks Ranger, DO   100 mg at 10/10/21 2118    Lab Results:  Results for orders placed or performed during the hospital encounter of 09/29/21 (from the past 48 hour(s))  Glucose, capillary     Status: Abnormal   Collection Time: 10/09/21  4:30 PM  Result Value Ref Range   Glucose-Capillary 144 (H) 70 - 99 mg/dL    Comment: Glucose reference range applies only to samples taken after fasting for at least 8 hours.  Glucose, capillary     Status: Abnormal   Collection Time: 10/09/21  7:50 PM  Result Value Ref Range   Glucose-Capillary 198 (H) 70 - 99 mg/dL    Comment: Glucose reference range applies only to samples taken after fasting for at least 8 hours.  Glucose, capillary     Status: Abnormal   Collection Time: 10/10/21  7:40 AM  Result Value Ref Range   Glucose-Capillary 149 (H) 70 - 99 mg/dL    Comment: Glucose reference range applies only to samples taken after fasting for at least 8 hours.  Glucose, capillary     Status: Abnormal   Collection Time: 10/10/21  1:03 PM  Result Value Ref Range   Glucose-Capillary 159 (H) 70 - 99 mg/dL    Comment: Glucose reference range applies only to samples taken after fasting for at least 8 hours.   Comment 1 Notify RN   Glucose, capillary     Status: Abnormal   Collection Time: 10/10/21  4:10 PM  Result Value Ref Range   Glucose-Capillary 211 (H) 70 - 99 mg/dL    Comment: Glucose reference range applies only to samples taken after fasting for at least 8 hours.    Comment 1 Notify RN   Glucose, capillary     Status: Abnormal   Collection Time: 10/10/21  8:12 PM  Result Value Ref Range   Glucose-Capillary 140 (H) 70 - 99 mg/dL  Comment: Glucose reference range applies only to samples taken after fasting for at least 8 hours.  Creatinine, serum     Status: Abnormal   Collection Time: 10/11/21  6:25 AM  Result Value Ref Range   Creatinine, Ser 2.43 (H) 0.44 - 1.00 mg/dL   GFR, Estimated 22 (L) >60 mL/min    Comment: (NOTE) Calculated using the CKD-EPI Creatinine Equation (2021) Performed at [facility], [address] [place], [place] [address]   Glucose, capillary     Status: Abnormal   Collection Time: 10/11/21  7:44 AM  Result Value Ref Range   Glucose-Capillary 146 (H) 70 - 99 mg/dL    Comment: Glucose reference range applies only to samples taken after fasting for at least 8 hours.  Glucose, capillary     Status: Abnormal   Collection Time: 10/11/21 11:44 AM  Result Value Ref Range   Glucose-Capillary 221 (H) 70 - 99 mg/dL    Comment: Glucose reference range applies only to samples taken after fasting for at least 8 hours.   Comment 1 Notify RN     Blood Alcohol level:  Lab Results  Component Value Date   ETH <10 09/20/2021   ETH <10 26/71/2458    Metabolic Disorder Labs: Lab Results  Component Value Date   HGBA1C 8.0 (H) 09/27/2021   MPG 182.9 09/27/2021   MPG 180.03 10/13/2020   No results found for: PROLACTIN Lab Results  Component Value Date   CHOL 267 (H) 10/13/2020   TRIG 180 (H) 10/13/2020   HDL 86 10/13/2020   CHOLHDL 3.1 10/13/2020   VLDL 36 10/13/2020   LDLCALC 145 (H) 10/13/2020   LDLCALC 158 (H) 02/29/2020    Physical Findings: AIMS:  , ,  ,  ,    CIWA:    COWS:     Musculoskeletal: Strength & Muscle Tone: within normal limits Gait & Station: normal Patient leans: N/A  Psychiatric Specialty Exam:  Presentation  General Appearance: Bizarre; Disheveled  Eye  Contact:Fair  Speech:Garbled  Speech Volume:Decreased  Handedness:Right   Mood and Affect  Mood:No data recorded Affect:Flat   Thought Process  Thought Processes:Disorganized  Descriptions of Associations:Loose  Orientation:Partial  Thought Content:Illogical  History of Schizophrenia/Schizoaffective disorder:Yes  Duration of Psychotic Symptoms:Greater than six months  Hallucinations:No data recorded Ideas of Reference:No data recorded Suicidal Thoughts:No data recorded Homicidal Thoughts:No data recorded  Sensorium  Memory:Immediate Poor  Judgment:Impaired  Insight:Lacking   Executive Functions  Concentration:Poor  Attention Span:Poor  Recall:Poor  Fund of Knowledge:Poor  Language:Poor   Psychomotor Activity  Psychomotor Activity:No data recorded  Assets  Assets:Housing; Social Support   Sleep  Sleep:No data recorded   Physical Exam: Physical Exam Vitals and nursing note reviewed.  Constitutional:      Appearance: Normal appearance.  HENT:     Head: Normocephalic and atraumatic.     Mouth/Throat:     Pharynx: Oropharynx is clear.  Eyes:     Pupils: Pupils are equal, round, and reactive to light.  Cardiovascular:     Rate and Rhythm: Normal rate and regular rhythm.  Pulmonary:     Effort: Pulmonary effort is normal.     Breath sounds: Normal breath sounds.  Abdominal:     General: Abdomen is flat.     Palpations: Abdomen is soft.  Musculoskeletal:        General: Normal range of motion.  Skin:    General: Skin is warm and dry.  Neurological:     General: No  focal deficit present.     Mental Status: She is alert. Mental status is at baseline.  Psychiatric:        Attention and Perception: Attention normal.        Mood and Affect: Mood normal. Affect is blunt.        Speech: Speech is delayed.        Behavior: Behavior is slowed.        Thought Content: Thought content normal.        Cognition and Memory: Cognition is  impaired.   Review of Systems  Constitutional: Negative.   HENT: Negative.    Eyes: Negative.   Respiratory: Negative.    Cardiovascular: Negative.   Gastrointestinal: Negative.   Musculoskeletal: Negative.   Skin: Negative.   Neurological: Negative.   Psychiatric/Behavioral: Negative.    Blood pressure (!) 150/66, pulse (!) 102, temperature 98 F (36.7 C), temperature source Oral, resp. rate 18, height 5' 2.99" (1.6 m), weight 46.9 kg, SpO2 100 %. Body mass index is 18.34 kg/m.   Treatment Plan Summary: Plan no change to psychiatric medicine.  I did add the Glucerna shake although her blood sugars are only partially controlled or not that bad and I did see that she was not able to eat her lunch.  No other change to medication for now.  Advised patient to follow up with primary team about discharge  Alethia Berthold, MD 10/11/2021, 2:06 PM

## 2021-10-11 NOTE — Progress Notes (Signed)
Pt was observed in bed early on in the shift. She is A&O x4. She denies pain, SI, HI, and AVH. Pt is med complaint and remains safe on the unit at this time. Q 15 minute safety checks in place. ?

## 2021-10-11 NOTE — Progress Notes (Signed)
Patient is alert and oriented with a flat affect. Patient is calm, cooperative and compliant during assessment. She denies anxiety, depression, SI, HI, AVH. Medications administered as ordered with no difficulty. Patient ate meals in the day room with good appetite. Patient denies pain or discomfort at this time. Remains safe on the unit with Q 15 minute safety checks. ?

## 2021-10-12 DIAGNOSIS — F250 Schizoaffective disorder, bipolar type: Secondary | ICD-10-CM | POA: Diagnosis not present

## 2021-10-12 LAB — GLUCOSE, CAPILLARY
Glucose-Capillary: 152 mg/dL — ABNORMAL HIGH (ref 70–99)
Glucose-Capillary: 275 mg/dL — ABNORMAL HIGH (ref 70–99)
Glucose-Capillary: 65 mg/dL — ABNORMAL LOW (ref 70–99)
Glucose-Capillary: 78 mg/dL (ref 70–99)
Glucose-Capillary: 162 mg/dL — ABNORMAL HIGH (ref 70–99)

## 2021-10-12 NOTE — [facility] Group Notes (Addendum)
[facility] Group Notes:  (Nursing/MHT/Case Management/Adjunct) ? ?Date:  10/12/2021  ?Time:  10:00 AM ? ?Type of Therapy:  Group Therapy ? ?Participation Level:  Did Not Attend ? ?Participation Quality:   N/A ? ?Affect:   N/A ? ?Cognitive:   N/A ? ?Insight:  None ? ?Engagement in Group:   N/A ? ?Modes of Intervention:   N/A ? ?Summary of Progress/Problems: ?The pt did not attend group. ?Juliette Alcide ?10/12/2021, 12:29 PM ?

## 2021-10-12 NOTE — Plan of Care (Signed)
Patient presents A&O x4 with flat affect.  Denies AVH, SI, HI or pain.  Compliant with all evening meds.  Spent most of evening on telephone talking with family and in day room eating snack.  Ambulating independent with steady gait.  Denies needs or complaints.  Will continue to monitor with Q 15 min safey checks. ? ?Problem: Education: ?Goal: Knowledge of [facility] Education information/materials will improve ?Outcome: Progressing ?Goal: Emotional status will improve ?Outcome: Progressing ?Goal: Mental status will improve ?Outcome: Progressing ?  ?Problem: Activity: ?Goal: Sleeping patterns will improve ?Outcome: Progressing ?  ?Problem: Health Behavior/Discharge Planning: ?Goal: Compliance with treatment plan for underlying cause of condition will improve ?Outcome: Progressing ?  ?

## 2021-10-12 NOTE — Progress Notes (Signed)
[facility] MD Progress Note  10/12/2021 12:14 PM Alicia Rogers  MRN:  [id number] Subjective: Patient continues to take her medications as prescribed.  Last week we changed her Invega to Zyprexa because she was not sleeping or eating very well and was also experiencing auditory hallucinations.  She is doing better on the Zyprexa but her blood sugars are a little high.  We will continue to monitor her situation.  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  We will switch her Ensure to Glucerna.  Principal Problem: Schizoaffective disorder, bipolar type (Alicia Rogers) Diagnosis: Principal Problem:   Schizoaffective disorder, bipolar type (Alicia Rogers)  Total Time spent with patient: 15 minutes  Past Psychiatric History: Schizophrenia  Past Medical History:  Past Medical History:  Diagnosis Date   Depression    Diabetes mellitus without complication (Alicia Rogers)    Diabetes mellitus, type II (Alicia Rogers)    Hypertension    Schizophrenia (Alicia Rogers)    History reviewed. No pertinent surgical history. Family History:  Family History  Problem Relation Age of Onset   Diabetes Mother    Glaucoma Mother    Heart disease Mother    Heart attack Mother    Depression Mother    Hypertension Mother    Diabetes Father    Glaucoma Father    Hypertension Father    Diabetes Sister    Depression Sister    Post-traumatic stress disorder Sister     Social History:  Social History   Substance and Sexual Activity  Alcohol Use No   Alcohol/week: 0.0 standard drinks     Social History   Substance and Sexual Activity  Drug Use No    Social History   Socioeconomic History   Marital status: Single    Spouse name: Not on file   Number of children: Not on file   Years of education: Not on file   Highest education level: Not on file  Occupational History   Not on file  Tobacco Use   Smoking status: Every Day    Packs/day: 1.00    Types: Cigarettes    Start date: 04/21/1978   Smokeless tobacco: Never  Vaping Use   Vaping  Use: Never used  Substance and Sexual Activity   Alcohol use: No    Alcohol/week: 0.0 standard drinks   Drug use: No   Sexual activity: Not Currently  Other Topics Concern   Not on file  Social History Narrative   Not on file   Social Determinants of Health   Financial Resource Strain: Not on file  Food Insecurity: Not on file  Transportation Needs: Not on file  Physical Activity: Not on file  Stress: Not on file  Social Connections: Not on file   Additional Social History:                         Sleep: Good  Appetite:  Fair  Current Medications: Current Facility-Administered Medications  Medication Dose Route Frequency Provider Last Rate Last Admin   acetaminophen (TYLENOL) tablet 650 mg  650 mg Oral Q6H PRN Alicia Rogers   650 mg at 10/12/21 1031   alum & mag hydroxide-simeth (MAALOX/MYLANTA) 200-200-20 MG/5ML suspension 30 mL  30 mL Oral Q4H PRN Alicia Rogers   30 mL at 10/01/21 2215   amLODipine (NORVASC) tablet 10 mg  10 mg Oral Daily Alicia Rogers   10 mg at 10/12/21 0954   cyclobenzaprine (FLEXERIL) tablet 10 mg  10  mg Oral QHS Alicia Rogers   10 mg at 10/11/21 2130   escitalopram (LEXAPRO) tablet 10 mg  10 mg Oral Daily Alicia Rogers   10 mg at 10/12/21 6203   feeding supplement (GLUCERNA SHAKE) (GLUCERNA SHAKE) liquid 237 mL  237 mL Oral TID BM Rogers, Alicia T, MD   237 mL at 10/12/21 1026   hydrALAZINE (APRESOLINE) tablet 50 mg  50 mg Oral TID Alicia Rogers   50 mg at 10/12/21 5597   hydrochlorothiazide (HYDRODIURIL) tablet 25 mg  25 mg Oral Daily Alicia Rogers   25 mg at 10/12/21 0954   insulin aspart (novoLOG) injection 0-15 Units  0-15 Units Subcutaneous TID WC Alicia Rogers   3 Units at 10/12/21 4163   insulin aspart (novoLOG) injection 0-5 Units  0-5 Units Subcutaneous QHS Alicia Rogers   2 Units at 10/10/21 0746   OLANZapine  (ZYPREXA) tablet 10 mg  10 mg Oral QHS Alicia Rogers   10 mg at 10/11/21 2130   ondansetron (ZOFRAN) tablet 4 mg  4 mg Oral Q6H PRN Alicia Rogers       Or   ondansetron [facility]) injection 4 mg  4 mg Intravenous Q6H PRN Alicia Rogers       polyethylene glycol (MIRALAX / GLYCOLAX) packet 17 g  17 g Oral Daily PRN Alicia Rogers       traZODone (DESYREL) tablet 100 mg  100 mg Oral QHS Alicia Rogers   100 mg at 10/11/21 2130    Lab Results:  Results for orders placed or performed during the hospital encounter of 09/29/21 (from the past 48 hour(s))  Glucose, capillary     Status: Abnormal   Collection Time: 10/10/21  1:03 PM  Result Value Ref Range   Glucose-Capillary 159 (H) 70 - 99 mg/dL    Comment: Glucose reference range applies only to samples taken after fasting for at least 8 hours.   Comment 1 Notify RN   Glucose, capillary     Status: Abnormal   Collection Time: 10/10/21  4:10 PM  Result Value Ref Range   Glucose-Capillary 211 (H) 70 - 99 mg/dL    Comment: Glucose reference range applies only to samples taken after fasting for at least 8 hours.   Comment 1 Notify RN   Glucose, capillary     Status: Abnormal   Collection Time: 10/10/21  8:12 PM  Result Value Ref Range   Glucose-Capillary 140 (H) 70 - 99 mg/dL    Comment: Glucose reference range applies only to samples taken after fasting for at least 8 hours.  Creatinine, serum     Status: Abnormal   Collection Time: 10/11/21  6:25 AM  Result Value Ref Range   Creatinine, Ser 2.43 (H) 0.44 - 1.00 mg/dL   GFR, Estimated 22 (L) >60 mL/min    Comment: (NOTE) Calculated using the CKD-EPI Creatinine Equation (2021) Performed at [facility], [address] [place], [place] [address]   Glucose, capillary     Status: Abnormal   Collection Time: 10/11/21  7:44 AM  Result Value Ref Range   Glucose-Capillary 146 (H) 70 - 99 mg/dL    Comment: Glucose  reference range applies only to samples taken after fasting for at least 8 hours.  Glucose, capillary     Status: Abnormal   Collection Time: 10/11/21 11:44 AM  Result Value Ref Range  Glucose-Capillary 221 (H) 70 - 99 mg/dL    Comment: Glucose reference range applies only to samples taken after fasting for at least 8 hours.   Comment 1 Notify RN   Glucose, capillary     Status: Abnormal   Collection Time: 10/11/21  4:20 PM  Result Value Ref Range   Glucose-Capillary 142 (H) 70 - 99 mg/dL    Comment: Glucose reference range applies only to samples taken after fasting for at least 8 hours.   Comment 1 Notify RN   Glucose, capillary     Status: Abnormal   Collection Time: 10/11/21  8:20 PM  Result Value Ref Range   Glucose-Capillary 151 (H) 70 - 99 mg/dL    Comment: Glucose reference range applies only to samples taken after fasting for at least 8 hours.  Glucose, capillary     Status: Abnormal   Collection Time: 10/12/21  7:54 AM  Result Value Ref Range   Glucose-Capillary 152 (H) 70 - 99 mg/dL    Comment: Glucose reference range applies only to samples taken after fasting for at least 8 hours.  Glucose, capillary     Status: Abnormal   Collection Time: 10/12/21 11:34 AM  Result Value Ref Range   Glucose-Capillary 275 (H) 70 - 99 mg/dL    Comment: Glucose reference range applies only to samples taken after fasting for at least 8 hours.    Blood Alcohol level:  Lab Results  Component Value Date   ETH <10 09/20/2021   ETH <10 71/69/6789    Metabolic Disorder Labs: Lab Results  Component Value Date   HGBA1C 8.0 (H) 09/27/2021   MPG 182.9 09/27/2021   MPG 180.03 10/13/2020   No results found for: PROLACTIN Lab Results  Component Value Date   CHOL 267 (H) 10/13/2020   TRIG 180 (H) 10/13/2020   HDL 86 10/13/2020   CHOLHDL 3.1 10/13/2020   VLDL 36 10/13/2020   LDLCALC 145 (H) 10/13/2020   LDLCALC 158 (H) 02/29/2020    Physical Findings: AIMS:  , ,  ,  ,    CIWA:     COWS:     Musculoskeletal: Strength & Muscle Tone: within normal limits Gait & Station: normal Patient leans: N/A  Psychiatric Specialty Exam:  Presentation  General Appearance: Bizarre; Disheveled  Eye Contact:Fair  Speech:Garbled  Speech Volume:Decreased  Handedness:Right   Mood and Affect  Mood:No data recorded Affect:Flat   Thought Process  Thought Processes:Disorganized  Descriptions of Associations:Loose  Orientation:Partial  Thought Content:Illogical  History of Schizophrenia/Schizoaffective disorder:Yes  Duration of Psychotic Symptoms:Greater than six months  Hallucinations:No data recorded Ideas of Reference:No data recorded Suicidal Thoughts:No data recorded Homicidal Thoughts:No data recorded  Sensorium  Memory:Immediate Poor  Judgment:Impaired  Insight:Lacking   Executive Functions  Concentration:Poor  Attention Span:Poor  Recall:Poor  Fund of Knowledge:Poor  Language:Poor   Psychomotor Activity  Psychomotor Activity:No data recorded  Assets  Assets:Housing; Social Support   Sleep  Sleep:No data recorded   Physical Exam: Physical Exam Vitals and nursing note reviewed.  Constitutional:      Appearance: Normal appearance. She is normal weight.  Neurological:     General: No focal deficit present.     Mental Status: She is alert and oriented to person, place, and time.  Psychiatric:        Attention and Perception: Attention normal. She perceives auditory hallucinations.        Mood and Affect: Mood normal. Affect is flat.  Speech: Speech is delayed.        Behavior: Behavior normal. Behavior is cooperative.        Thought Content: Thought content normal.        Cognition and Memory: Cognition and memory normal.        Judgment: Judgment normal.   Review of Systems  Constitutional: Negative.   HENT: Negative.    Eyes: Negative.   Respiratory: Negative.    Cardiovascular: Negative.   Gastrointestinal:  Negative.   Genitourinary: Negative.   Musculoskeletal: Negative.   Skin: Negative.   Neurological: Negative.   Endo/Heme/Allergies: Negative.   Psychiatric/Behavioral: Negative.    Blood pressure 134/66, pulse 83, temperature 97.8 F (36.6 C), temperature source Oral, resp. rate 20, height 5' 2.99" (1.6 m), weight 46.9 kg, SpO2 100 %. Body mass index is 18.34 kg/m.   Treatment Plan Summary: Daily contact with patient to assess and evaluate symptoms and progress in treatment, Medication management, and Plan Continue current medications. Change ensure to Glucerna.  Alicia Rogers 10/12/2021, 12:14 PM

## 2021-10-12 NOTE — Plan of Care (Signed)
Patient remain alert and oriented, calm and cooperative during assessment. Denies anxiety and depression. Denies SI, HI, AVH. Remain ambulatory with slow unsteady gait. Ate breakfast in the day room among peers and tolerated well. Remain safe on the unit with Q 15 minute safety check. ? ?Problem: Education: ?Goal: Knowledge of [facility] Education information/materials will improve ?Outcome: Progressing ?Goal: Emotional status will improve ?Outcome: Progressing ?Goal: Mental status will improve ?Outcome: Progressing ?Goal: Verbalization of understanding the information provided will improve ?Outcome: Progressing ?  ?Problem: Activity: ?Goal: Interest or engagement in activities will improve ?Outcome: Progressing ?Goal: Sleeping patterns will improve ?Outcome: Progressing ?  ?Problem: Coping: ?Goal: Ability to verbalize frustrations and anger appropriately will improve ?Outcome: Progressing ?Goal: Ability to demonstrate self-control will improve ?Outcome: Progressing ?  ?Problem: Health Behavior/Discharge Planning: ?Goal: Identification of resources available to assist in meeting health care needs will improve ?Outcome: Progressing ?Goal: Compliance with treatment plan for underlying cause of condition will improve ?Outcome: Progressing ?  ?Problem: Physical Regulation: ?Goal: Ability to maintain clinical measurements within normal limits will improve ?Outcome: Progressing ?  ?Problem: Safety: ?Goal: Periods of time without injury will increase ?Outcome: Progressing ?  ?

## 2021-10-12 NOTE — BHH Counselor (Addendum)
CSW sent referral information for admission to [facility], fax: [phone], phone: [phone] due to bed openings.  ?CSW contacted Hilda Blades in admission regarding fax receipt and will follow up on admission status accordingly.  ? ?Dean Goldner [place], MSW, LCSW-A ?3/13/20234:23 PM  ?

## 2021-10-12 NOTE — Progress Notes (Signed)
Recreation Therapy Notes ?  ?  ? ?  ?Date: 10/12/2021 ?  ?Time: 1:40pm ?  ?Location: Craft room   ?  ?Behavioral response: N/A ?  ?Intervention Topic: Goals  ? ?Discussion/Intervention: ?Patient did not attend group. ?  ?Clinical Observations/Feedback:  ?Patient did not attend group. ?  ?Myranda Pavone LRT/CTRS ? ? ? ?Retaj Hilbun ?10/12/2021 2:54 PM ?

## 2021-10-13 DIAGNOSIS — F250 Schizoaffective disorder, bipolar type: Secondary | ICD-10-CM | POA: Diagnosis not present

## 2021-10-13 LAB — GLUCOSE, CAPILLARY
Glucose-Capillary: 132 mg/dL — ABNORMAL HIGH (ref 70–99)
Glucose-Capillary: 227 mg/dL — ABNORMAL HIGH (ref 70–99)
Glucose-Capillary: 172 mg/dL — ABNORMAL HIGH (ref 70–99)
Glucose-Capillary: 134 mg/dL — ABNORMAL HIGH (ref 70–99)
Glucose-Capillary: 235 mg/dL — ABNORMAL HIGH (ref 70–99)

## 2021-10-13 NOTE — BHH Counselor (Signed)
CSW contacted the following places in an attempt to find placement: ? ?[facility] contact Debra in admission and left voicemail regarding receipt of fax.  ? ?[facility], CSW contact, no ability to leave voicemail.  ? ?Shirlee Limerick, admissions coordinator at [facility], [phone], no ability to leave voicemail.  ? ?CSW will follow up at another time regarding placement.  ? ?Benedicto Capozzi [place], MSW, LCSW-A ?3/14/20234:28 PM  ?

## 2021-10-13 NOTE — Progress Notes (Signed)
[facility] MD Progress Note ? ?10/13/2021 11:10 AM ?[place]  ?MRN:  [id number] ?Subjective: Patient asks about where she might be going after discharge.  She is taking her medications as prescribed and denies any side effects.  She states that she slept well last night.  She denies any auditory hallucinations.  She denies any issues at this point in time. ? ?Principal Problem: Schizoaffective disorder, bipolar type ([place]) ?Diagnosis: Principal Problem: ?  Schizoaffective disorder, bipolar type ([place]) ? ?Total Time spent with patient: 15 minutes ? ?Past Psychiatric History:  [facility] [place] ? ?Past Medical History:  ?Past Medical History:  ?Diagnosis Date  ? Depression   ? Diabetes mellitus without complication ([place])   ? Diabetes mellitus, type II ([place])   ? Hypertension   ? Schizophrenia ([place])   ? History reviewed. No pertinent surgical history. ?Family History:  ?Family History  ?Problem Relation Age of Onset  ? Diabetes Mother   ? Glaucoma Mother   ? Heart disease Mother   ? Heart attack Mother   ? Depression Mother   ? Hypertension Mother   ? Diabetes Father   ? Glaucoma Father   ? Hypertension Father   ? Diabetes Sister   ? Depression Sister   ? Post-traumatic stress disorder Sister   ? ? ?Social History:  ?Social History  ? ?Substance and Sexual Activity  ?Alcohol Use No  ? Alcohol/week: 0.0 standard drinks  ?   ?Social History  ? ?Substance and Sexual Activity  ?Drug Use No  ?  ?Social History  ? ?Socioeconomic History  ? Marital status: Single  ?  Spouse name: Not on file  ? Number of children: Not on file  ? Years of education: Not on file  ? Highest education level: Not on file  ?Occupational History  ? Not on file  ?Tobacco Use  ? Smoking status: Every Day  ?  Packs/day: 1.00  ?  Types: Cigarettes  ?  Start date: 04/21/1978  ? Smokeless tobacco: Never  ?Vaping Use  ? Vaping Use: Never used  ?Substance and Sexual Activity  ? Alcohol use: No  ?  Alcohol/week: 0.0 standard drinks  ? Drug use: No  ?  Sexual activity: Not Currently  ?Other Topics Concern  ? Not on file  ?Social History Narrative  ? Not on file  ? ?Social Determinants of Health  ? ?Financial Resource Strain: Not on file  ?Food Insecurity: Not on file  ?Transportation Needs: Not on file  ?Physical Activity: Not on file  ?Stress: Not on file  ?Social Connections: Not on file  ? ?Additional Social History:  ?  ?  ?  ?  ?  ?  ?  ?  ?  ?  ?  ? ?Sleep: Good ? ?Appetite:  Fair ? ?Current Medications: ?Current Facility-Administered Medications  ?Medication Dose Route Frequency Provider Last Rate Last Admin  ? acetaminophen (TYLENOL) tablet 650 mg  650 mg Oral Q6H PRN Caroline Sauger, NP   650 mg at 10/12/21 1031  ? alum & mag hydroxide-simeth (MAALOX/MYLANTA) 200-200-20 MG/5ML suspension 30 mL  30 mL Oral Q4H PRN Parks Ranger, DO   30 mL at 10/01/21 2215  ? amLODipine (NORVASC) tablet 10 mg  10 mg Oral Daily Parks Ranger, DO   10 mg at 10/13/21 1020  ? cyclobenzaprine (FLEXERIL) tablet 10 mg  10 mg Oral QHS Parks Ranger, DO   10 mg at 10/12/21 2101  ? escitalopram (LEXAPRO) tablet 10 mg  10 mg Oral Daily Parks Ranger, DO   10 mg at 10/13/21 1020  ? feeding supplement (GLUCERNA SHAKE) (GLUCERNA SHAKE) liquid 237 mL  237 mL Oral TID BM Clapacs, John T, MD   237 mL at 10/13/21 1021  ? hydrALAZINE (APRESOLINE) tablet 50 mg  50 mg Oral TID Parks Ranger, DO   50 mg at 10/13/21 0825  ? hydrochlorothiazide (HYDRODIURIL) tablet 25 mg  25 mg Oral Daily Parks Ranger, DO   25 mg at 10/13/21 1020  ? insulin aspart (novoLOG) injection 0-15 Units  0-15 Units Subcutaneous TID WC Parks Ranger, DO   2 Units at 10/13/21 9242  ? insulin aspart (novoLOG) injection 0-5 Units  0-5 Units Subcutaneous QHS Parks Ranger, DO   2 Units at 10/10/21 6834  ? OLANZapine (ZYPREXA) tablet 10 mg  10 mg Oral QHS Parks Ranger, DO   10 mg at 10/12/21 2101  ? ondansetron (ZOFRAN) tablet 4 mg   4 mg Oral Q6H PRN Parks Ranger, DO      ? Or  ? ondansetron (ZOFRAN) injection 4 mg  4 mg Intravenous Q6H PRN Parks Ranger, DO      ? polyethylene glycol (MIRALAX / GLYCOLAX) packet 17 g  17 g Oral Daily PRN Parks Ranger, DO      ? traZODone (DESYREL) tablet 100 mg  100 mg Oral QHS Parks Ranger, DO   100 mg at 10/12/21 2100  ? ? ?Lab Results:  ?Results for orders placed or performed during the hospital encounter of 09/29/21 (from the past 48 hour(s))  ?Glucose, capillary     Status: Abnormal  ? Collection Time: 10/11/21 11:44 AM  ?Result Value Ref Range  ? Glucose-Capillary 221 (H) 70 - 99 mg/dL  ?  Comment: Glucose reference range applies only to samples taken after fasting for at least 8 hours.  ? Comment 1 Notify RN   ?Glucose, capillary     Status: Abnormal  ? Collection Time: 10/11/21  4:20 PM  ?Result Value Ref Range  ? Glucose-Capillary 142 (H) 70 - 99 mg/dL  ?  Comment: Glucose reference range applies only to samples taken after fasting for at least 8 hours.  ? Comment 1 Notify RN   ?Glucose, capillary     Status: Abnormal  ? Collection Time: 10/11/21  8:20 PM  ?Result Value Ref Range  ? Glucose-Capillary 151 (H) 70 - 99 mg/dL  ?  Comment: Glucose reference range applies only to samples taken after fasting for at least 8 hours.  ?Glucose, capillary     Status: Abnormal  ? Collection Time: 10/12/21  7:54 AM  ?Result Value Ref Range  ? Glucose-Capillary 152 (H) 70 - 99 mg/dL  ?  Comment: Glucose reference range applies only to samples taken after fasting for at least 8 hours.  ?Glucose, capillary     Status: Abnormal  ? Collection Time: 10/12/21 11:34 AM  ?Result Value Ref Range  ? Glucose-Capillary 275 (H) 70 - 99 mg/dL  ?  Comment: Glucose reference range applies only to samples taken after fasting for at least 8 hours.  ?Glucose, capillary     Status: Abnormal  ? Collection Time: 10/12/21  4:24 PM  ?Result Value Ref Range  ? Glucose-Capillary 65 (L) 70 - 99 mg/dL   ?  Comment: Glucose reference range applies only to samples taken after fasting for at least 8 hours.  ?Glucose, capillary     Status: None  ?  Collection Time: 10/12/21  5:03 PM  ?Result Value Ref Range  ? Glucose-Capillary 78 70 - 99 mg/dL  ?  Comment: Glucose reference range applies only to samples taken after fasting for at least 8 hours.  ?Glucose, capillary     Status: Abnormal  ? Collection Time: 10/12/21  8:34 PM  ?Result Value Ref Range  ? Glucose-Capillary 162 (H) 70 - 99 mg/dL  ?  Comment: Glucose reference range applies only to samples taken after fasting for at least 8 hours.  ?Glucose, capillary     Status: Abnormal  ? Collection Time: 10/13/21  7:57 AM  ?Result Value Ref Range  ? Glucose-Capillary 132 (H) 70 - 99 mg/dL  ?  Comment: Glucose reference range applies only to samples taken after fasting for at least 8 hours.  ? ? ?Blood Alcohol level:  ?Lab Results  ?Component Value Date  ? ETH <10 09/20/2021  ? ETH <10 10/13/2020  ? ? ?Metabolic Disorder Labs: ?Lab Results  ?Component Value Date  ? HGBA1C 8.0 (H) 09/27/2021  ? MPG 182.9 09/27/2021  ? MPG 180.03 10/13/2020  ? ?No results found for: PROLACTIN ?Lab Results  ?Component Value Date  ? CHOL 267 (H) 10/13/2020  ? TRIG 180 (H) 10/13/2020  ? HDL 86 10/13/2020  ? CHOLHDL 3.1 10/13/2020  ? VLDL 36 10/13/2020  ? LDLCALC 145 (H) 10/13/2020  ? LDLCALC 158 (H) 02/29/2020  ? ? ?Physical Findings: ?AIMS:  , ,  ,  ,    ?CIWA:    ?COWS:    ? ?Musculoskeletal: ?Strength & Muscle Tone: within normal limits ?Gait & Station: normal ?Patient leans: N/A ? ?Psychiatric Specialty Exam: ? ?Presentation  ?General Appearance: Bizarre; Disheveled ? ?Eye Contact:Fair ? ?Speech:Garbled ? ?Speech Volume:Decreased ? ?Handedness:Right ? ? ?Mood and Affect  ?Mood:No data recorded ?Affect:Flat ? ? ?Thought Process  ?Thought Processes:Disorganized ? ?Descriptions of Associations:Loose ? ?Orientation:Partial ? ?Thought Content:Illogical ? ?History of  Schizophrenia/Schizoaffective disorder:Yes ? ?Duration of Psychotic Symptoms:Greater than six months ? ?Hallucinations:No data recorded ?Ideas of Reference:No data recorded ?Suicidal Thoughts:No data recorded ?Homicidal Thoughts

## 2021-10-13 NOTE — BHH Group Notes (Signed)
[facility] Group Notes:  (Nursing/MHT/Case Management/Adjunct) ? ?Date:  10/13/2021  ?Time:  10:00 AM ? ?Type of Therapy:  Group Therapy ? ?Participation Level:  Active ? ?Participation Quality:  Appropriate and Attentive ? ?Affect:  Appropriate ? ?Cognitive:  Appropriate ? ?Insight:  Appropriate ? ?Engagement in Group:  Engaged ? ?Modes of Intervention:  Discussion ? ?Summary of Progress/Problems ?The pt attended group and participated in sharing and group activities. ? ?Alicia Rogers ?10/13/2021, 10:58 AM ?

## 2021-10-13 NOTE — Plan of Care (Signed)
°  Problem: Group Participation °Goal: STG - Patient will engage in groups without prompting or encouragement from LRT x3 group sessions within 5 recreation therapy group sessions °Description: STG - Patient will engage in groups without prompting or encouragement from LRT x3 group sessions within 5 recreation therapy group sessions °Outcome: Progressing °  °

## 2021-10-13 NOTE — Plan of Care (Signed)
Patient remain alert and oriented, calm and compliant during assessment. Denies anxiety and depression. Denies SI, HI, AVH. Denies pain or discomfort at this time. Compliant with medications. Ate meals in the day room among peers and tolerated well. Remain safe on the unit with Q 15 minute safety checks. ? ?Problem: Education: ?Goal: Knowledge of [facility] Education information/materials will improve ?Outcome: Progressing ?Goal: Emotional status will improve ?Outcome: Progressing ?Goal: Mental status will improve ?Outcome: Progressing ?Goal: Verbalization of understanding the information provided will improve ?Outcome: Progressing ?  ?Problem: Activity: ?Goal: Interest or engagement in activities will improve ?Outcome: Progressing ?Goal: Sleeping patterns will improve ?Outcome: Progressing ?  ?Problem: Coping: ?Goal: Ability to verbalize frustrations and anger appropriately will improve ?Outcome: Progressing ?Goal: Ability to demonstrate self-control will improve ?Outcome: Progressing ?  ?Problem: Health Behavior/Discharge Planning: ?Goal: Identification of resources available to assist in meeting health care needs will improve ?Outcome: Progressing ?Goal: Compliance with treatment plan for underlying cause of condition will improve ?Outcome: Progressing ?  ?Problem: Physical Regulation: ?Goal: Ability to maintain clinical measurements within normal limits will improve ?Outcome: Progressing ?  ?Problem: Safety: ?Goal: Periods of time without injury will increase ?Outcome: Progressing ?  ?

## 2021-10-13 NOTE — Group Note (Signed)
[place] LCSW Group Therapy Note ? ? ?Group Date: 10/13/2021 ?Start Time: 1400 ?End Time: 5885 ? ? ?Type of Therapy/Topic:  Group Therapy:  Balance in Life ? ?Participation Level:  Minimal  ? ?Description of Group:   ? This group will address the concept of balance and how it feels and looks when one is unbalanced. Patients will be encouraged to process areas in their lives that are out of balance, and identify reasons for remaining unbalanced. Facilitators will guide patients utilizing problem- solving interventions to address and correct the stressor making their life unbalanced. Understanding and applying boundaries will be explored and addressed for obtaining  and maintaining a balanced life. Patients will be encouraged to explore ways to assertively make their unbalanced needs known to significant others in their lives, using other group members and facilitator for support and feedback. ? ?Therapeutic Goals: ?Patient will identify two or more emotions or situations they have that consume much of in their lives. ?Patient will identify signs/triggers that life has become out of balance:  ?Patient will identify two ways to set boundaries in order to achieve balance in their lives:  ?Patient will demonstrate ability to communicate their needs through discussion and/or role plays ? ?Summary of Patient Progress: ? ? ? ?Patient was present for the entirety of the group session. Patient was an active listener and participated in the topic of discussion, provided helpful advice to others, and added nuance to topic of conversation.  ?She stated that "always taking medications" is one thing she wants to do moving forward to have more balance when she is discharged.  ? ? ?Therapeutic Modalities:   ?Cognitive Behavioral Therapy ?Solution-Focused Therapy ?Assertiveness Training ? ? ?Alven Alverio A [place], LCSWA ?

## 2021-10-13 NOTE — Progress Notes (Signed)
Recreation Therapy Notes ? ?Date: 10/13/2021 ?  ?Time: 1:20pm  ?  ?Location: Craft room   ?  ?Behavioral response: Appropriate ?  ?Intervention Topic:  Emotions  ?  ?Discussion/Intervention:  ?Group content on today was focused on emotions. The group identified what emotions are and why it is important to have emotions. Patients expressed some positive and negative emotions. Individuals gave some past experiences on how they normally dealt with emotions in the past. The group described some positive ways to deal with emotions in the future. Patients participated in the intervention ?The Situation? where individuals were given a chance to respond to certain situations involving their emotions.  ?Clinical Observations/Feedback: ?Patient came to group and defined emotions as how you feel. Individual was social with staff and peers while participating in the intervention.  ?Doneen Ollinger LRT/CTRS  ? ? ? ? ? ? ? ?Candance Bohlman ?10/13/2021 2:12 PM ?

## 2021-10-14 DIAGNOSIS — F250 Schizoaffective disorder, bipolar type: Secondary | ICD-10-CM | POA: Diagnosis not present

## 2021-10-14 LAB — GLUCOSE, CAPILLARY
Glucose-Capillary: 177 mg/dL — ABNORMAL HIGH (ref 70–99)
Glucose-Capillary: 241 mg/dL — ABNORMAL HIGH (ref 70–99)
Glucose-Capillary: 78 mg/dL (ref 70–99)
Glucose-Capillary: 154 mg/dL — ABNORMAL HIGH (ref 70–99)

## 2021-10-14 NOTE — Plan of Care (Signed)
Patient presents A&O x4 with flat and blunted affect.  Denies AVH, SI, HI or pain.  Compliant with all evening meds.  Spent most of evening in day room with no observed interaction amoung peers.  Ambulating independent with steady gait.  Denies needs or complaints.  Will continue to monitor with Q 15 min safey checks. ? ?Problem: Education: ?Goal: Knowledge of [facility] Education information/materials will improve ?Outcome: Progressing ?Goal: Emotional status will improve ?Outcome: Progressing ?Goal: Mental status will improve ?Outcome: Progressing ?  ?Problem: Activity: ?Goal: Sleeping patterns will improve ?Outcome: Progressing ?  ?

## 2021-10-14 NOTE — Plan of Care (Signed)
Patient remain alert and oriented X3 with soft and delayed speech. Continue to require assistance with ADL's. Ambulatory with slow and unsteady gait. Denies pain or discomfort at this time. Denise depression and anxiety. Denies SI, HI, and AVH. Complete linen change. Patient encouraged to ask for assistance. Ate breakfast in the day room with good appetite. AM blood sugar 177- 3u of novolog given Patient is currently in bed and in no apparent distress. Q 15 minute safety checks continue.  ? ? ?Problem: Education: ?Goal: Knowledge of [facility] Education information/materials will improve ?Outcome: Progressing ?Goal: Emotional status will improve ?Outcome: Progressing ?Goal: Mental status will improve ?Outcome: Progressing ?Goal: Verbalization of understanding the information provided will improve ?Outcome: Progressing ?  ?Problem: Activity: ?Goal: Interest or engagement in activities will improve ?Outcome: Progressing ?Goal: Sleeping patterns will improve ?Outcome: Progressing ?  ?Problem: Coping: ?Goal: Ability to verbalize frustrations and anger appropriately will improve ?Outcome: Progressing ?Goal: Ability to demonstrate self-control will improve ?Outcome: Progressing ?  ?Problem: Safety: ?Goal: Periods of time without injury will increase ?Outcome: Progressing ?  ?Problem: Physical Regulation: ?Goal: Ability to maintain clinical measurements within normal limits will improve ?Outcome: Progressing ?  ?Problem: Health Behavior/Discharge Planning: ?Goal: Identification of resources available to assist in meeting health care needs will improve ?Outcome: Progressing ?Goal: Compliance with treatment plan for underlying cause of condition will improve ?Outcome: Progressing ?  ?

## 2021-10-14 NOTE — Progress Notes (Signed)
Recreation Therapy Notes ? ?Date: 10/14/2021 ?  ?Time: 1:30pm ?  ?Location: Craft room   ?  ?Behavioral response: N/A ?  ?Intervention Topic: Stress Management   ?  ?Discussion/Intervention: ?Patient refused to attend group. ?  ?Clinical Observations/Feedback:  ?Patient refused to attend group. ?  ?Oron Westrup LRT/CTRS ? ? ? ? ? ? ? ?Tewana Bohlen ?10/14/2021 2:43 PM ?

## 2021-10-14 NOTE — Progress Notes (Signed)
[facility] MD Progress Note ? ?10/14/2021 10:34 AM ?[name]  ?MRN:  [id number] ?Subjective:  Alicia Rogers is doing well. States she is sleeping well. Her mood is improving. Denies auditory hallucinations. Zyprexa has been helping. Blood sugar has been consistently around 170. She is pleasant and cooperative. SW looking into discharge plans.  No complaints.  No side effects. ? ?Principal Problem: Schizoaffective disorder, bipolar type ([place]) ?Diagnosis: Principal Problem: ?  Schizoaffective disorder, bipolar type ([place]) ? ?Total Time spent with patient: 15 minutes ? ?Past Psychiatric History: [facility] ? ?Past Medical History:  ?Past Medical History:  ?Diagnosis Date  ? Depression   ? Diabetes mellitus without complication ([place])   ? Diabetes mellitus, type II ([place])   ? Hypertension   ? Schizophrenia ([place])   ? History reviewed. No pertinent surgical history. ?Family History:  ?Family History  ?Problem Relation Age of Onset  ? Diabetes Mother   ? Glaucoma Mother   ? Heart disease Mother   ? Heart attack Mother   ? Depression Mother   ? Hypertension Mother   ? Diabetes Father   ? Glaucoma Father   ? Hypertension Father   ? Diabetes Sister   ? Depression Sister   ? Post-traumatic stress disorder Sister   ? ? ?Social History:  ?Social History  ? ?Substance and Sexual Activity  ?Alcohol Use No  ? Alcohol/week: 0.0 standard drinks  ?   ?Social History  ? ?Substance and Sexual Activity  ?Drug Use No  ?  ?Social History  ? ?Socioeconomic History  ? Marital status: Single  ?  Spouse name: Not on file  ? Number of children: Not on file  ? Years of education: Not on file  ? Highest education level: Not on file  ?Occupational History  ? Not on file  ?Tobacco Use  ? Smoking status: Every Day  ?  Packs/day: 1.00  ?  Types: Cigarettes  ?  Start date: 04/21/1978  ? Smokeless tobacco: Never  ?Vaping Use  ? Vaping Use: Never used  ?Substance and Sexual Activity  ? Alcohol use: No  ?  Alcohol/week: 0.0 standard drinks  ?  Drug use: No  ? Sexual activity: Not Currently  ?Other Topics Concern  ? Not on file  ?Social History Narrative  ? Not on file  ? ?Social Determinants of Health  ? ?Financial Resource Strain: Not on file  ?Food Insecurity: Not on file  ?Transportation Needs: Not on file  ?Physical Activity: Not on file  ?Stress: Not on file  ?Social Connections: Not on file  ? ?Additional Social History:  ?  ?  ?  ?  ?  ?  ?  ?  ?  ?  ?  ? ?Sleep: Good ? ?Appetite:  Fair ? ?Current Medications: ?Current Facility-Administered Medications  ?Medication Dose Route Frequency Provider Last Rate Last Admin  ? acetaminophen (TYLENOL) tablet 650 mg  650 mg Oral Q6H PRN Caroline Sauger, NP   650 mg at 10/12/21 1031  ? alum & mag hydroxide-simeth (MAALOX/MYLANTA) 200-200-20 MG/5ML suspension 30 mL  30 mL Oral Q4H PRN Parks Ranger, DO   30 mL at 10/01/21 2215  ? amLODipine (NORVASC) tablet 10 mg  10 mg Oral Daily Parks Ranger, DO   10 mg at 10/14/21 4627  ? cyclobenzaprine (FLEXERIL) tablet 10 mg  10 mg Oral QHS Parks Ranger, DO   10 mg at 10/13/21 2153  ? escitalopram (LEXAPRO) tablet 10 mg  10 mg Oral  Daily Parks Ranger, DO   10 mg at 10/14/21 0947  ? feeding supplement (GLUCERNA SHAKE) (GLUCERNA SHAKE) liquid 237 mL  237 mL Oral TID BM Clapacs, John T, MD   237 mL at 10/14/21 0930  ? hydrALAZINE (APRESOLINE) tablet 50 mg  50 mg Oral TID Parks Ranger, DO   50 mg at 10/14/21 0843  ? hydrochlorothiazide (HYDRODIURIL) tablet 25 mg  25 mg Oral Daily Parks Ranger, DO   25 mg at 10/14/21 0962  ? insulin aspart (novoLOG) injection 0-15 Units  0-15 Units Subcutaneous TID WC Parks Ranger, DO   3 Units at 10/14/21 8366  ? insulin aspart (novoLOG) injection 0-5 Units  0-5 Units Subcutaneous QHS Parks Ranger, DO   2 Units at 10/13/21 2152  ? OLANZapine (ZYPREXA) tablet 10 mg  10 mg Oral QHS Parks Ranger, DO   10 mg at 10/13/21 2152  ? ondansetron  (ZOFRAN) tablet 4 mg  4 mg Oral Q6H PRN Parks Ranger, DO      ? Or  ? ondansetron (ZOFRAN) injection 4 mg  4 mg Intravenous Q6H PRN Parks Ranger, DO      ? polyethylene glycol (MIRALAX / GLYCOLAX) packet 17 g  17 g Oral Daily PRN Parks Ranger, DO      ? traZODone (DESYREL) tablet 100 mg  100 mg Oral QHS Parks Ranger, DO   100 mg at 10/13/21 2151  ? ? ?Lab Results:  ?Results for orders placed or performed during the hospital encounter of 09/29/21 (from the past 48 hour(s))  ?Glucose, capillary     Status: Abnormal  ? Collection Time: 10/12/21 11:34 AM  ?Result Value Ref Range  ? Glucose-Capillary 275 (H) 70 - 99 mg/dL  ?  Comment: Glucose reference range applies only to samples taken after fasting for at least 8 hours.  ?Glucose, capillary     Status: Abnormal  ? Collection Time: 10/12/21  4:24 PM  ?Result Value Ref Range  ? Glucose-Capillary 65 (L) 70 - 99 mg/dL  ?  Comment: Glucose reference range applies only to samples taken after fasting for at least 8 hours.  ?Glucose, capillary     Status: None  ? Collection Time: 10/12/21  5:03 PM  ?Result Value Ref Range  ? Glucose-Capillary 78 70 - 99 mg/dL  ?  Comment: Glucose reference range applies only to samples taken after fasting for at least 8 hours.  ?Glucose, capillary     Status: Abnormal  ? Collection Time: 10/12/21  8:34 PM  ?Result Value Ref Range  ? Glucose-Capillary 162 (H) 70 - 99 mg/dL  ?  Comment: Glucose reference range applies only to samples taken after fasting for at least 8 hours.  ?Glucose, capillary     Status: Abnormal  ? Collection Time: 10/13/21  7:57 AM  ?Result Value Ref Range  ? Glucose-Capillary 132 (H) 70 - 99 mg/dL  ?  Comment: Glucose reference range applies only to samples taken after fasting for at least 8 hours.  ?Glucose, capillary     Status: Abnormal  ? Collection Time: 10/13/21 11:45 AM  ?Result Value Ref Range  ? Glucose-Capillary 227 (H) 70 - 99 mg/dL  ?  Comment: Glucose reference  range applies only to samples taken after fasting for at least 8 hours.  ?Glucose, capillary     Status: Abnormal  ? Collection Time: 10/13/21  1:35 PM  ?Result Value Ref Range  ? Glucose-Capillary 172 (H) 70 -  99 mg/dL  ?  Comment: Glucose reference range applies only to samples taken after fasting for at least 8 hours.  ?Glucose, capillary     Status: Abnormal  ? Collection Time: 10/13/21  4:20 PM  ?Result Value Ref Range  ? Glucose-Capillary 134 (H) 70 - 99 mg/dL  ?  Comment: Glucose reference range applies only to samples taken after fasting for at least 8 hours.  ?Glucose, capillary     Status: Abnormal  ? Collection Time: 10/13/21  9:35 PM  ?Result Value Ref Range  ? Glucose-Capillary 235 (H) 70 - 99 mg/dL  ?  Comment: Glucose reference range applies only to samples taken after fasting for at least 8 hours.  ?Glucose, capillary     Status: Abnormal  ? Collection Time: 10/14/21  8:09 AM  ?Result Value Ref Range  ? Glucose-Capillary 177 (H) 70 - 99 mg/dL  ?  Comment: Glucose reference range applies only to samples taken after fasting for at least 8 hours.  ? ? ?Blood Alcohol level:  ?Lab Results  ?Component Value Date  ? ETH <10 09/20/2021  ? ETH <10 10/13/2020  ? ? ?Metabolic Disorder Labs: ?Lab Results  ?Component Value Date  ? HGBA1C 8.0 (H) 09/27/2021  ? MPG 182.9 09/27/2021  ? MPG 180.03 10/13/2020  ? ?No results found for: PROLACTIN ?Lab Results  ?Component Value Date  ? CHOL 267 (H) 10/13/2020  ? TRIG 180 (H) 10/13/2020  ? HDL 86 10/13/2020  ? CHOLHDL 3.1 10/13/2020  ? VLDL 36 10/13/2020  ? LDLCALC 145 (H) 10/13/2020  ? LDLCALC 158 (H) 02/29/2020  ? ? ?Physical Findings: ?AIMS:  , ,  ,  ,    ?CIWA:    ?COWS:    ? ?Musculoskeletal: ?Strength & Muscle Tone: within normal limits ?Gait & Station: normal ?Patient leans: N/A ? ?Psychiatric Specialty Exam: ? ?Presentation  ?General Appearance: Bizarre; Disheveled ? ?Eye Contact:Fair ? ?Speech:Garbled ? ?Speech Volume:Decreased ? ?Handedness:Right ? ? ?Mood and  Affect  ?Mood:No data recorded ?Affect:Flat ? ? ?Thought Process  ?Thought Processes:Disorganized ? ?Descriptions of Associations:Loose ? ?Orientation:Partial ? ?Thought Content:Illogical ? ?History of Schizophre

## 2021-10-14 NOTE — BHH Counselor (Signed)
CSW contacted the following places for potential placement due to openings.  ? ?[facility], [phone] CSW will send referral information via the [facility] ? ?[facility]- spoke with admissions and they stated they would follow up regarding network with [facility] and if pt referral was a possibility.  ? ?Alicia Rogers [place], MSW, LCSW-A ?3/15/20234:18 PM  ?

## 2021-10-15 DIAGNOSIS — F250 Schizoaffective disorder, bipolar type: Secondary | ICD-10-CM | POA: Diagnosis not present

## 2021-10-15 LAB — GLUCOSE, CAPILLARY
Glucose-Capillary: 158 mg/dL — ABNORMAL HIGH (ref 70–99)
Glucose-Capillary: 293 mg/dL — ABNORMAL HIGH (ref 70–99)
Glucose-Capillary: 61 mg/dL — ABNORMAL LOW (ref 70–99)
Glucose-Capillary: 150 mg/dL — ABNORMAL HIGH (ref 70–99)

## 2021-10-15 NOTE — BH IP Treatment Plan (Signed)
Interdisciplinary Treatment and Diagnostic Plan Update ? ?10/15/2021 ?Time of Session: 9:30 AM ?October S Bevard ?MRN: [id number] ? ?Principal Diagnosis: Schizoaffective disorder, bipolar type ([place]) ? ?Secondary Diagnoses: Principal Problem: ?  Schizoaffective disorder, bipolar type ([place]) ? ? ?Current Medications:  ?Current Facility-Administered Medications  ?Medication Dose Route Frequency Provider Last Rate Last Admin  ? acetaminophen (TYLENOL) tablet 650 mg  650 mg Oral Q6H PRN Caroline Sauger, NP   650 mg at 10/12/21 1031  ? alum & mag hydroxide-simeth (MAALOX/MYLANTA) 200-200-20 MG/5ML suspension 30 mL  30 mL Oral Q4H PRN Parks Ranger, DO   30 mL at 10/01/21 2215  ? amLODipine (NORVASC) tablet 10 mg  10 mg Oral Daily Parks Ranger, DO   10 mg at 10/15/21 0935  ? cyclobenzaprine (FLEXERIL) tablet 10 mg  10 mg Oral QHS Parks Ranger, DO   10 mg at 10/14/21 2115  ? escitalopram (LEXAPRO) tablet 10 mg  10 mg Oral Daily Parks Ranger, DO   10 mg at 10/15/21 0935  ? feeding supplement (GLUCERNA SHAKE) (GLUCERNA SHAKE) liquid 237 mL  237 mL Oral TID BM Clapacs, John T, MD   237 mL at 10/15/21 0955  ? hydrALAZINE (APRESOLINE) tablet 50 mg  50 mg Oral TID Parks Ranger, DO   50 mg at 10/15/21 0810  ? hydrochlorothiazide (HYDRODIURIL) tablet 25 mg  25 mg Oral Daily Parks Ranger, DO   25 mg at 10/15/21 0935  ? insulin aspart (novoLOG) injection 0-15 Units  0-15 Units Subcutaneous TID WC Parks Ranger, DO   8 Units at 10/15/21 1148  ? insulin aspart (novoLOG) injection 0-5 Units  0-5 Units Subcutaneous QHS Parks Ranger, DO   2 Units at 10/13/21 2152  ? OLANZapine (ZYPREXA) tablet 10 mg  10 mg Oral QHS Parks Ranger, DO   10 mg at 10/14/21 2115  ? ondansetron (ZOFRAN) tablet 4 mg  4 mg Oral Q6H PRN Parks Ranger, DO      ? Or  ? ondansetron (ZOFRAN) injection 4 mg  4 mg Intravenous Q6H PRN Parks Ranger, DO       ? polyethylene glycol (MIRALAX / GLYCOLAX) packet 17 g  17 g Oral Daily PRN Parks Ranger, DO      ? traZODone (DESYREL) tablet 100 mg  100 mg Oral QHS Parks Ranger, DO   100 mg at 10/14/21 2116  ? ?PTA Medications: ?Medications Prior to Admission  ?Medication Sig Dispense Refill Last Dose  ? amLODipine (NORVASC) 10 MG tablet Take 1 tablet (10 mg total) by mouth daily.     ? [EXPIRED] cefdinir (OMNICEF) 300 MG capsule Take 1 capsule (300 mg total) by mouth daily for 3 days. 3 capsule 0   ? hydrALAZINE (APRESOLINE) 50 MG tablet Take 1 tablet (50 mg total) by mouth 3 (three) times daily.     ? hydrochlorothiazide (HYDRODIURIL) 12.5 MG tablet Take 1 tablet (12.5 mg total) by mouth daily.     ? ? ?Patient Stressors: Health problems   ? ?Patient Strengths: Religious Affiliation  ? ?Treatment Modalities: Medication Management, Group therapy, Case management,  ?1 to 1 session with clinician, Psychoeducation, Recreational therapy. ? ? ?Physician Treatment Plan for Primary Diagnosis: Schizoaffective disorder, bipolar type ([place]) ?Long Term Goal(s): Improvement in symptoms so as ready for discharge  ? ?Short Term Goals: Ability to identify changes in lifestyle to reduce recurrence of condition will improve ?Ability to verbalize feelings will improve ?Ability to disclose  and discuss suicidal ideas ?Ability to demonstrate self-control will improve ?Ability to identify and develop effective coping behaviors will improve ?Ability to maintain clinical measurements within normal limits will improve ?Compliance with prescribed medications will improve ?Ability to identify triggers associated with substance abuse/mental health issues will improve ? ?Medication Management: Evaluate patient's response, side effects, and tolerance of medication regimen. ? ?Therapeutic Interventions: 1 to 1 sessions, Unit Group sessions and Medication administration. ? ?Evaluation of Outcomes: Progressing ? ?Physician Treatment  Plan for Secondary Diagnosis: Principal Problem: ?  Schizoaffective disorder, bipolar type ([place]) ? ?Long Term Goal(s): Improvement in symptoms so as ready for discharge  ? ?Short Term Goals: Ability to identify changes in lifestyle to reduce recurrence of condition will improve ?Ability to verbalize feelings will improve ?Ability to disclose and discuss suicidal ideas ?Ability to demonstrate self-control will improve ?Ability to identify and develop effective coping behaviors will improve ?Ability to maintain clinical measurements within normal limits will improve ?Compliance with prescribed medications will improve ?Ability to identify triggers associated with substance abuse/mental health issues will improve    ? ?Medication Management: Evaluate patient's response, side effects, and tolerance of medication regimen. ? ?Therapeutic Interventions: 1 to 1 sessions, Unit Group sessions and Medication administration. ? ?Evaluation of Outcomes: Progressing ? ? ?RN Treatment Plan for Primary Diagnosis: Schizoaffective disorder, bipolar type ([place]) ?Long Term Goal(s): Knowledge of disease and therapeutic regimen to maintain health will improve ? ?Short Term Goals: Ability to remain free from injury will improve, Ability to verbalize frustration and anger appropriately will improve, Ability to demonstrate self-control, Ability to participate in decision making will improve, Ability to verbalize feelings will improve, Ability to identify and develop effective coping behaviors will improve, and Compliance with prescribed medications will improve ? ?Medication Management: RN will administer medications as ordered by provider, will assess and evaluate patient's response and provide education to patient for prescribed medication. RN will report any adverse and/or side effects to prescribing provider. ? ?Therapeutic Interventions: 1 on 1 counseling sessions, Psychoeducation, Medication administration, Evaluate responses to  treatment, Monitor vital signs and CBGs as ordered, Perform/monitor CIWA, COWS, AIMS and Fall Risk screenings as ordered, Perform wound care treatments as ordered. ? ?Evaluation of Outcomes: Progressing ? ? ?LCSW Treatment Plan for Primary Diagnosis: Schizoaffective disorder, bipolar type ([place]) ?Long Term Goal(s): Safe transition to appropriate next level of care at discharge, Engage patient in therapeutic group addressing interpersonal concerns. ? ?Short Term Goals: Engage patient in aftercare planning with referrals and resources, Increase social support, Increase ability to appropriately verbalize feelings, Increase emotional regulation, Facilitate acceptance of mental health diagnosis and concerns, Identify triggers associated with mental health/substance abuse issues, and Increase skills for wellness and recovery ? ?Therapeutic Interventions: Assess for all discharge needs, 1 to 1 time with Education officer, museum, Explore available resources and support systems, Assess for adequacy in community support network, Educate family and significant other(s) on suicide prevention, Complete Psychosocial Assessment, Interpersonal group therapy. ? ?Evaluation of Outcomes: Progressing ? ? ?Progress in Treatment: ?Attending groups: Yes. ?Participating in groups: Yes. ?Taking medication as prescribed: Yes. ?Toleration medication: Yes. ?Family/Significant other contact made: Yes, individual(s) contacted:  SPE completed with pt's sister, Tamelia Michalowski ?Patient understands diagnosis: No. ?Discussing patient identified problems/goals with staff: Yes. ?Medical problems stabilized or resolved: Yes. ?Denies suicidal/homicidal ideation: Yes. ?Issues/concerns per patient self-inventory: No. ?Other: None ? ?New problem(s) identified: No, Describe:  None ? ?New Short Term/Long Term Goal(s): Patient to work towards elimination of symptoms of psychosis, medication management for mood stabilization;  development of comprehensive mental wellness plan.  Update 10/05/21: No changes at this time. Update 10/10/2021: No changes at this time. Update 10/15/21: No changes at this time.  ?  ?Patient Goals:  "to get better and taking my medication" Update 10/05/21: No cha

## 2021-10-15 NOTE — Progress Notes (Signed)
Recreation Therapy Notes ? ?Date: 10/15/2021 ? ?Time: 10:35 am  ?  ?Location: Day room   ? ?Behavioral response: N/A ?  ?Intervention Topic: Animal Assisted therapy   ? ?Discussion/Intervention: ?Patient refused to attend group. ?  ?Clinical Observations/Feedback:  ?Patient refused to attend group. ?  ?Angela Platner LRT/CTRS ? ? ? ? ? ? ? ?Alicia Rogers ?10/15/2021 12:30 PM ?

## 2021-10-15 NOTE — Plan of Care (Signed)
Patient remain alert and oriented X3 with soft and delayed speech. Continues to require assistance with ADL's. Ambulatory with slow and unsteady gait. Denies pain or discomfort at this time. Denise depression and anxiety. Denies SI, HI, and AVH. Patient encouraged to ask for assistance. Ate breakfast in the day room with good appetite. AM blood sugar 158- 3u of novolog given. Patient is currently in bed and in no apparent distress. Q 15 minute safety checks continue. ? ? ? ?Problem: Education: ?Goal: Knowledge of [facility] Education information/materials will improve ?Outcome: Progressing ?Goal: Emotional status will improve ?Outcome: Progressing ?Goal: Mental status will improve ?Outcome: Progressing ?Goal: Verbalization of understanding the information provided will improve ?Outcome: Progressing ?  ?Problem: Activity: ?Goal: Interest or engagement in activities will improve ?Outcome: Progressing ?Goal: Sleeping patterns will improve ?Outcome: Progressing ?  ?Problem: Coping: ?Goal: Ability to verbalize frustrations and anger appropriately will improve ?Outcome: Progressing ?Goal: Ability to demonstrate self-control will improve ?Outcome: Progressing ?  ?Problem: Safety: ?Goal: Periods of time without injury will increase ?Outcome: Progressing ?   ?

## 2021-10-15 NOTE — Progress Notes (Signed)
[facility] MD Progress Note ? ?10/15/2021 10:31 AM ?[place]  ?MRN:  [id number] ?Subjective:  Alicia Rogers does not have any complaints today.  She is alert and oriented x3.  Very soft spoken and her speech is somewhat delayed as usual.  She denies any auditory hallucinations.  She is taking her medications as prescribed denies any side effects.  Social work is continuing to look for placement because she cannot live on her own and her family is unable to take care of her. ? ?Principal Problem: Schizoaffective disorder, bipolar type ([place]) ?Diagnosis: Principal Problem: ?  Schizoaffective disorder, bipolar type ([place]) ? ?Total Time spent with patient: 15 minutes ? ?Past Psychiatric History: [place] [facility] ? ?Past Medical History:  ?Past Medical History:  ?Diagnosis Date  ? Depression   ? Diabetes mellitus without complication ([place])   ? Diabetes mellitus, type II ([place])   ? Hypertension   ? Schizophrenia ([place])   ? History reviewed. No pertinent surgical history. ?Family History:  ?Family History  ?Problem Relation Age of Onset  ? Diabetes Mother   ? Glaucoma Mother   ? Heart disease Mother   ? Heart attack Mother   ? Depression Mother   ? Hypertension Mother   ? Diabetes Father   ? Glaucoma Father   ? Hypertension Father   ? Diabetes Sister   ? Depression Sister   ? Post-traumatic stress disorder Sister   ? ? ?Social History:  ?Social History  ? ?Substance and Sexual Activity  ?Alcohol Use No  ? Alcohol/week: 0.0 standard drinks  ?   ?Social History  ? ?Substance and Sexual Activity  ?Drug Use No  ?  ?Social History  ? ?Socioeconomic History  ? Marital status: Single  ?  Spouse name: Not on file  ? Number of children: Not on file  ? Years of education: Not on file  ? Highest education level: Not on file  ?Occupational History  ? Not on file  ?Tobacco Use  ? Smoking status: Every Day  ?  Packs/day: 1.00  ?  Types: Cigarettes  ?  Start date: 04/21/1978  ? Smokeless tobacco: Never  ?Vaping Use  ? Vaping Use: Never  used  ?Substance and Sexual Activity  ? Alcohol use: No  ?  Alcohol/week: 0.0 standard drinks  ? Drug use: No  ? Sexual activity: Not Currently  ?Other Topics Concern  ? Not on file  ?Social History Narrative  ? Not on file  ? ?Social Determinants of Health  ? ?Financial Resource Strain: Not on file  ?Food Insecurity: Not on file  ?Transportation Needs: Not on file  ?Physical Activity: Not on file  ?Stress: Not on file  ?Social Connections: Not on file  ? ?Additional Social History:  ?  ?  ?  ?  ?  ?  ?  ?  ?  ?  ?  ? ?Sleep: Good ? ?Appetite:  Fair ? ?Current Medications: ?Current Facility-Administered Medications  ?Medication Dose Route Frequency Provider Last Rate Last Admin  ? acetaminophen (TYLENOL) tablet 650 mg  650 mg Oral Q6H PRN Caroline Sauger, NP   650 mg at 10/12/21 1031  ? alum & mag hydroxide-simeth (MAALOX/MYLANTA) 200-200-20 MG/5ML suspension 30 mL  30 mL Oral Q4H PRN Parks Ranger, DO   30 mL at 10/01/21 2215  ? amLODipine (NORVASC) tablet 10 mg  10 mg Oral Daily Parks Ranger, DO   10 mg at 10/15/21 0935  ? cyclobenzaprine (FLEXERIL) tablet 10 mg  10 mg Oral QHS Parks Ranger, DO   10 mg at 10/14/21 2115  ? escitalopram (LEXAPRO) tablet 10 mg  10 mg Oral Daily Parks Ranger, DO   10 mg at 10/15/21 0935  ? feeding supplement (GLUCERNA SHAKE) (GLUCERNA SHAKE) liquid 237 mL  237 mL Oral TID BM Clapacs, John T, MD   237 mL at 10/15/21 0955  ? hydrALAZINE (APRESOLINE) tablet 50 mg  50 mg Oral TID Parks Ranger, DO   50 mg at 10/15/21 0810  ? hydrochlorothiazide (HYDRODIURIL) tablet 25 mg  25 mg Oral Daily Parks Ranger, DO   25 mg at 10/15/21 0935  ? insulin aspart (novoLOG) injection 0-15 Units  0-15 Units Subcutaneous TID WC Parks Ranger, DO   3 Units at 10/15/21 2703  ? insulin aspart (novoLOG) injection 0-5 Units  0-5 Units Subcutaneous QHS Parks Ranger, DO   2 Units at 10/13/21 2152  ? OLANZapine (ZYPREXA)  tablet 10 mg  10 mg Oral QHS Parks Ranger, DO   10 mg at 10/14/21 2115  ? ondansetron (ZOFRAN) tablet 4 mg  4 mg Oral Q6H PRN Parks Ranger, DO      ? Or  ? ondansetron (ZOFRAN) injection 4 mg  4 mg Intravenous Q6H PRN Parks Ranger, DO      ? polyethylene glycol (MIRALAX / GLYCOLAX) packet 17 g  17 g Oral Daily PRN Parks Ranger, DO      ? traZODone (DESYREL) tablet 100 mg  100 mg Oral QHS Parks Ranger, DO   100 mg at 10/14/21 2116  ? ? ?Lab Results:  ?Results for orders placed or performed during the hospital encounter of 09/29/21 (from the past 48 hour(s))  ?Glucose, capillary     Status: Abnormal  ? Collection Time: 10/13/21 11:45 AM  ?Result Value Ref Range  ? Glucose-Capillary 227 (H) 70 - 99 mg/dL  ?  Comment: Glucose reference range applies only to samples taken after fasting for at least 8 hours.  ?Glucose, capillary     Status: Abnormal  ? Collection Time: 10/13/21  1:35 PM  ?Result Value Ref Range  ? Glucose-Capillary 172 (H) 70 - 99 mg/dL  ?  Comment: Glucose reference range applies only to samples taken after fasting for at least 8 hours.  ?Glucose, capillary     Status: Abnormal  ? Collection Time: 10/13/21  4:20 PM  ?Result Value Ref Range  ? Glucose-Capillary 134 (H) 70 - 99 mg/dL  ?  Comment: Glucose reference range applies only to samples taken after fasting for at least 8 hours.  ?Glucose, capillary     Status: Abnormal  ? Collection Time: 10/13/21  9:35 PM  ?Result Value Ref Range  ? Glucose-Capillary 235 (H) 70 - 99 mg/dL  ?  Comment: Glucose reference range applies only to samples taken after fasting for at least 8 hours.  ?Glucose, capillary     Status: Abnormal  ? Collection Time: 10/14/21  8:09 AM  ?Result Value Ref Range  ? Glucose-Capillary 177 (H) 70 - 99 mg/dL  ?  Comment: Glucose reference range applies only to samples taken after fasting for at least 8 hours.  ?Glucose, capillary     Status: Abnormal  ? Collection Time: 10/14/21 11:52  AM  ?Result Value Ref Range  ? Glucose-Capillary 241 (H) 70 - 99 mg/dL  ?  Comment: Glucose reference range applies only to samples taken after fasting for at least 8 hours.  ?  Glucose, capillary     Status: None  ? Collection Time: 10/14/21  4:19 PM  ?Result Value Ref Range  ? Glucose-Capillary 78 70 - 99 mg/dL  ?  Comment: Glucose reference range applies only to samples taken after fasting for at least 8 hours.  ?Glucose, capillary     Status: Abnormal  ? Collection Time: 10/14/21  9:07 PM  ?Result Value Ref Range  ? Glucose-Capillary 154 (H) 70 - 99 mg/dL  ?  Comment: Glucose reference range applies only to samples taken after fasting for at least 8 hours.  ?Glucose, capillary     Status: Abnormal  ? Collection Time: 10/15/21  7:42 AM  ?Result Value Ref Range  ? Glucose-Capillary 158 (H) 70 - 99 mg/dL  ?  Comment: Glucose reference range applies only to samples taken after fasting for at least 8 hours.  ? ? ?Blood Alcohol level:  ?Lab Results  ?Component Value Date  ? ETH <10 09/20/2021  ? ETH <10 10/13/2020  ? ? ?Metabolic Disorder Labs: ?Lab Results  ?Component Value Date  ? HGBA1C 8.0 (H) 09/27/2021  ? MPG 182.9 09/27/2021  ? MPG 180.03 10/13/2020  ? ?No results found for: PROLACTIN ?Lab Results  ?Component Value Date  ? CHOL 267 (H) 10/13/2020  ? TRIG 180 (H) 10/13/2020  ? HDL 86 10/13/2020  ? CHOLHDL 3.1 10/13/2020  ? VLDL 36 10/13/2020  ? LDLCALC 145 (H) 10/13/2020  ? LDLCALC 158 (H) 02/29/2020  ? ? ?Physical Findings: ?AIMS:  , ,  ,  ,    ?CIWA:    ?COWS:    ? ?Musculoskeletal: ?Strength & Muscle Tone: within normal limits ?Gait & Station: normal ?Patient leans: N/A ? ?Psychiatric Specialty Exam: ? ?Presentation  ?General Appearance: Bizarre; Disheveled ? ?Eye Contact:Fair ? ?Speech:Garbled ? ?Speech Volume:Decreased ? ?Handedness:Right ? ? ?Mood and Affect  ?Mood:No data recorded ?Affect:Flat ? ? ?Thought Process  ?Thought Processes:Disorganized ? ?Descriptions of  Associations:Loose ? ?Orientation:Partial ? ?Thought Content:Illogical ? ?History of Schizophrenia/Schizoaffective disorder:Yes ? ?Duration of Psychotic Symptoms:Greater than six months ? ?Hallucinations:No data recorded ?Ideas of Reference:No d

## 2021-10-15 NOTE — Progress Notes (Signed)
Patient is A&O x4. She denies SI, HI, AVH, anxiety, and depression. Pt remains safe on the unit at this time. Q 15 min safety checks in place.  ?

## 2021-10-16 DIAGNOSIS — F250 Schizoaffective disorder, bipolar type: Secondary | ICD-10-CM | POA: Diagnosis not present

## 2021-10-16 LAB — GLUCOSE, CAPILLARY
Glucose-Capillary: 183 mg/dL — ABNORMAL HIGH (ref 70–99)
Glucose-Capillary: 178 mg/dL — ABNORMAL HIGH (ref 70–99)
Glucose-Capillary: 151 mg/dL — ABNORMAL HIGH (ref 70–99)
Glucose-Capillary: 106 mg/dL — ABNORMAL HIGH (ref 70–99)

## 2021-10-16 MED ORDER — INSULIN ASPART 100 UNIT/ML IJ SOLN
2.0000 [IU] | Freq: Three times a day (TID) | INTRAMUSCULAR | Status: DC
  Administered 2021-10-16 – 2021-10-19 (×9): 2 [IU] via SUBCUTANEOUS
  Filled 2021-10-16 (×8): qty 1

## 2021-10-16 MED ORDER — LINAGLIPTIN 5 MG PO TABS
5.0000 mg | ORAL_TABLET | Freq: Every day | ORAL | Status: DC
Start: 2021-10-16 — End: 2021-11-10
  Administered 2021-10-16 – 2021-11-10 (×26): 5 mg via ORAL
  Filled 2021-10-16 (×28): qty 1

## 2021-10-16 NOTE — Progress Notes (Signed)
Patient is observed in the bed at the beginning of the shift. She stated she had an okay day. She denies anxiety, depression, SI, HI, and AVH. Pt is medication compliant and voiced no complaints to this Probation officer.  ? ?Pt is safe on the unit at this time. Q 15 minute safety checks in place.  ?

## 2021-10-16 NOTE — Group Note (Signed)
[facility] LCSW Group Therapy Note ? ? ? ?Group Date: 10/16/2021 ?Start Time: 1500 ?End Time: 1600 ? ?Type of Therapy and Topic:  Group Therapy:  Overcoming Obstacles ? ?Participation Level:  [facility] PARTICIPATION LEVEL: None ? ?Mood: blunted  ? ?Description of Group:   ?In this group patients will be encouraged to explore what they see as obstacles to their own wellness and recovery. They will be guided to discuss their thoughts, feelings, and behaviors related to these obstacles. The group will process together ways to cope with barriers, with attention given to specific choices patients can make. Each patient will be challenged to identify changes they are motivated to make in order to overcome their obstacles. This group will be process-oriented, with patients participating in exploration of their own experiences as well as giving and receiving support and challenge from other group members. ? ?Therapeutic Goals: ?1. Patient will identify personal and current obstacles as they relate to admission. ?2. Patient will identify barriers that currently interfere with their wellness or overcoming obstacles.  ?3. Patient will identify feelings, thought process and behaviors related to these barriers. ?4. Patient will identify two changes they are willing to make to overcome these obstacles:  ? ? ?Summary of Patient Progress ?Patient did not participate in any way during group. Patient remained silent, though was appropriate in group setting.  ? ? ? ?Therapeutic Modalities:   ?Cognitive Behavioral Therapy ?Solution Focused Therapy ?Motivational Interviewing ?Relapse Prevention Therapy ? ? ?Durenda Hurt, LCSWA ?

## 2021-10-16 NOTE — [facility] Group Notes (Signed)
[facility] Group Notes:  (Nursing/MHT/Case Management/Adjunct) ? ?Date:  10/16/2021  ?Time:  10:00 AM ? ?Type of Therapy:  Psychoeducational Skills ? ?Participation Level:  Active ? ?Participation Quality:  Appropriate and Attentive ? ?Affect:  Appropriate ? ?Cognitive:  Appropriate ? ?Insight:  Appropriate ? ?Engagement in Group:  Engaged ? ?Modes of Intervention:  Discussion ? ?Summary of Progress/Problems: ?Pt attended group and was involved. ?Alicia Rogers ?10/16/2021, 11:28 AM ?

## 2021-10-16 NOTE — Progress Notes (Signed)
Patient ID: Alicia Rogers, female   DOB: 1961-04-19, 62 y.o.   MRN: [id number] ? ? ? ?Consider reducing Novolog correction to sensitive tid with meals.  ?Also consider adding oral agent such as Tradjenta 5 mg daily which will help with postprandial blood sugars and also has low risk of hypoglycemia. Thanks,  ?

## 2021-10-16 NOTE — Progress Notes (Signed)
Inpatient Diabetes Program Recommendations ? ?AACE/ADA: New Consensus Statement on Inpatient Glycemic Control (2015) ? ?Target Ranges:  Prepandial:   less than 140 mg/dL ?     Peak postprandial:   less than 180 mg/dL (1-2 hours) ?     Critically ill patients:  140 - 180 mg/dL  ? ?Lab Results  ?Component Value Date  ? GLUCAP 183 (H) 10/16/2021  ? HGBA1C 8.0 (H) 09/27/2021  ? ? ?Review of Glycemic Control ? Latest Reference Range & Units 10/15/21 07:42 10/15/21 11:43 10/15/21 16:01 10/15/21 21:07 10/16/21 07:44  ?Glucose-Capillary 70 - 99 mg/dL 158 (H) 293 (H) 61 (L) 150 (H) 183 (H)  ? ?Diabetes history: DM 2 ?Outpatient Diabetes medications: None listed ?Current orders for Inpatient glycemic control:  ?Novolog moderate tid with meals and HS ? ?Inpatient Diabetes Program Recommendations:   ?Consider reducing Novolog correction to sensitive tid with meals.  ?Also consider adding oral agent such as Tradjenta 5 mg daily which will help with postprandial blood sugars and also has low risk of hypoglycemia.  ? ?Thanks,  ?Adah Perl, RN, BC-ADM ?Inpatient Diabetes Coordinator ?Pager [phone]  (8a-5p) ? ? ? ?

## 2021-10-16 NOTE — Progress Notes (Signed)
Recreation Therapy Notes ? ?Date: 10/16/2021   ?Time: 1:35pm   ?  ?Location: Craft room    ?  ?Behavioral response: N/A ?  ?Intervention Topic:  Social Skills  ?  ?Discussion/Intervention:  ?Patient refused to attend group. ? ?Clinical Observations/Feedback: ?Patient refused to attend group.  ? ?Avamae Dehaan LRT/CTRS  ?  ? ? ? ? ? ? ? ?Alicia Rogers ?10/16/2021 2:46 PM ?

## 2021-10-16 NOTE — Progress Notes (Signed)
[facility] MD Progress Note ? ?10/16/2021 11:35 AM ?Alicia Rogers  ?MRN:  [id number] ?Subjective:  Alicia Rogers is doing well.  She is waiting to hear from social work on where she will be going to live.  She is taking her medications as prescribed and denies any side effects.  Blood sugars have been up a little bit and diabetic nurse recommended starting her on NovoLog with meals and Tradjenta daily.  No news from social work.  She is eating and sleeping well.  She is interacting with peers and staff.  No issues. ? ?Principal Problem: Schizoaffective disorder, bipolar type ([place]) ?Diagnosis: Principal Problem: ?  Schizoaffective disorder, bipolar type ([place]) ? ?Total Time spent with patient: 15 minutes ? ?Past Psychiatric History: [place] [facility] ? ?Past Medical History:  ?Past Medical History:  ?Diagnosis Date  ? Depression   ? Diabetes mellitus without complication ([place])   ? Diabetes mellitus, type II ([place])   ? Hypertension   ? Schizophrenia ([place])   ? History reviewed. No pertinent surgical history. ?Family History:  ?Family History  ?Problem Relation Age of Onset  ? Diabetes Mother   ? Glaucoma Mother   ? Heart disease Mother   ? Heart attack Mother   ? Depression Mother   ? Hypertension Mother   ? Diabetes Father   ? Glaucoma Father   ? Hypertension Father   ? Diabetes Sister   ? Depression Sister   ? Post-traumatic stress disorder Sister   ? ? ?Social History:  ?Social History  ? ?Substance and Sexual Activity  ?Alcohol Use No  ? Alcohol/week: 0.0 standard drinks  ?   ?Social History  ? ?Substance and Sexual Activity  ?Drug Use No  ?  ?Social History  ? ?Socioeconomic History  ? Marital status: Single  ?  Spouse name: Not on file  ? Number of children: Not on file  ? Years of education: Not on file  ? Highest education level: Not on file  ?Occupational History  ? Not on file  ?Tobacco Use  ? Smoking status: Every Day  ?  Packs/day: 1.00  ?  Types: Cigarettes  ?  Start date: 04/21/1978  ? Smokeless tobacco:  Never  ?Vaping Use  ? Vaping Use: Never used  ?Substance and Sexual Activity  ? Alcohol use: No  ?  Alcohol/week: 0.0 standard drinks  ? Drug use: No  ? Sexual activity: Not Currently  ?Other Topics Concern  ? Not on file  ?Social History Narrative  ? Not on file  ? ?Social Determinants of Health  ? ?Financial Resource Strain: Not on file  ?Food Insecurity: Not on file  ?Transportation Needs: Not on file  ?Physical Activity: Not on file  ?Stress: Not on file  ?Social Connections: Not on file  ? ?Additional Social History:  ?  ?  ?  ?  ?  ?  ?  ?  ?  ?  ?  ? ?Sleep: Good ? ?Appetite:  Good ? ?Current Medications: ?Current Facility-Administered Medications  ?Medication Dose Route Frequency Provider Last Rate Last Admin  ? acetaminophen (TYLENOL) tablet 650 mg  650 mg Oral Q6H PRN Caroline Sauger, NP   650 mg at 10/12/21 1031  ? alum & mag hydroxide-simeth (MAALOX/MYLANTA) 200-200-20 MG/5ML suspension 30 mL  30 mL Oral Q4H PRN Parks Ranger, DO   30 mL at 10/01/21 2215  ? amLODipine (NORVASC) tablet 10 mg  10 mg Oral Daily Parks Ranger, DO   10 mg at  10/16/21 0935  ? cyclobenzaprine (FLEXERIL) tablet 10 mg  10 mg Oral QHS Parks Ranger, DO   10 mg at 10/15/21 2109  ? escitalopram (LEXAPRO) tablet 10 mg  10 mg Oral Daily Parks Ranger, DO   10 mg at 10/16/21 0935  ? feeding supplement (GLUCERNA SHAKE) (GLUCERNA SHAKE) liquid 237 mL  237 mL Oral TID BM Clapacs, John T, MD   237 mL at 10/16/21 0941  ? hydrALAZINE (APRESOLINE) tablet 50 mg  50 mg Oral TID Parks Ranger, DO   50 mg at 10/16/21 0820  ? hydrochlorothiazide (HYDRODIURIL) tablet 25 mg  25 mg Oral Daily Parks Ranger, DO   25 mg at 10/16/21 0935  ? insulin aspart (novoLOG) injection 2 Units  2 Units Subcutaneous TID WC Parks Ranger, DO      ? linagliptin (TRADJENTA) tablet 5 mg  5 mg Oral Daily Parks Ranger, DO      ? OLANZapine (ZYPREXA) tablet 10 mg  10 mg Oral QHS Parks Ranger, DO   10 mg at 10/15/21 2109  ? ondansetron (ZOFRAN) tablet 4 mg  4 mg Oral Q6H PRN Parks Ranger, DO      ? Or  ? ondansetron (ZOFRAN) injection 4 mg  4 mg Intravenous Q6H PRN Parks Ranger, DO      ? polyethylene glycol (MIRALAX / GLYCOLAX) packet 17 g  17 g Oral Daily PRN Parks Ranger, DO      ? traZODone (DESYREL) tablet 100 mg  100 mg Oral QHS Parks Ranger, DO   100 mg at 10/15/21 2109  ? ? ?Lab Results:  ?Results for orders placed or performed during the hospital encounter of 09/29/21 (from the past 48 hour(s))  ?Glucose, capillary     Status: Abnormal  ? Collection Time: 10/14/21 11:52 AM  ?Result Value Ref Range  ? Glucose-Capillary 241 (H) 70 - 99 mg/dL  ?  Comment: Glucose reference range applies only to samples taken after fasting for at least 8 hours.  ?Glucose, capillary     Status: None  ? Collection Time: 10/14/21  4:19 PM  ?Result Value Ref Range  ? Glucose-Capillary 78 70 - 99 mg/dL  ?  Comment: Glucose reference range applies only to samples taken after fasting for at least 8 hours.  ?Glucose, capillary     Status: Abnormal  ? Collection Time: 10/14/21  9:07 PM  ?Result Value Ref Range  ? Glucose-Capillary 154 (H) 70 - 99 mg/dL  ?  Comment: Glucose reference range applies only to samples taken after fasting for at least 8 hours.  ?Glucose, capillary     Status: Abnormal  ? Collection Time: 10/15/21  7:42 AM  ?Result Value Ref Range  ? Glucose-Capillary 158 (H) 70 - 99 mg/dL  ?  Comment: Glucose reference range applies only to samples taken after fasting for at least 8 hours.  ?Glucose, capillary     Status: Abnormal  ? Collection Time: 10/15/21 11:43 AM  ?Result Value Ref Range  ? Glucose-Capillary 293 (H) 70 - 99 mg/dL  ?  Comment: Glucose reference range applies only to samples taken after fasting for at least 8 hours.  ?Glucose, capillary     Status: Abnormal  ? Collection Time: 10/15/21  4:01 PM  ?Result Value Ref Range  ?  Glucose-Capillary 61 (L) 70 - 99 mg/dL  ?  Comment: Glucose reference range applies only to samples taken after fasting for at least 8  hours.  ?Glucose, capillary     Status: Abnormal  ? Collection Time: 10/15/21  9:07 PM  ?Result Value Ref Range  ? Glucose-Capillary 150 (H) 70 - 99 mg/dL  ?  Comment: Glucose reference range applies only to samples taken after fasting for at least 8 hours.  ?Glucose, capillary     Status: Abnormal  ? Collection Time: 10/16/21  7:44 AM  ?Result Value Ref Range  ? Glucose-Capillary 183 (H) 70 - 99 mg/dL  ?  Comment: Glucose reference range applies only to samples taken after fasting for at least 8 hours.  ? ? ?Blood Alcohol level:  ?Lab Results  ?Component Value Date  ? ETH <10 09/20/2021  ? ETH <10 10/13/2020  ? ? ?Metabolic Disorder Labs: ?Lab Results  ?Component Value Date  ? HGBA1C 8.0 (H) 09/27/2021  ? MPG 182.9 09/27/2021  ? MPG 180.03 10/13/2020  ? ?No results found for: PROLACTIN ?Lab Results  ?Component Value Date  ? CHOL 267 (H) 10/13/2020  ? TRIG 180 (H) 10/13/2020  ? HDL 86 10/13/2020  ? CHOLHDL 3.1 10/13/2020  ? VLDL 36 10/13/2020  ? LDLCALC 145 (H) 10/13/2020  ? LDLCALC 158 (H) 02/29/2020  ? ? ?Physical Findings: ?AIMS:  , ,  ,  ,    ?CIWA:    ?COWS:    ? ?Musculoskeletal: ?Strength & Muscle Tone: within normal limits ?Gait & Station: normal ?Patient leans: N/A ? ?Psychiatric Specialty Exam: ? ?Presentation  ?General Appearance: Bizarre; Disheveled ? ?Eye Contact:Fair ? ?Speech:Garbled ? ?Speech Volume:Decreased ? ?Handedness:Right ? ? ?Mood and Affect  ?Mood:No data recorded ?Affect:Flat ? ? ?Thought Process  ?Thought Processes:Disorganized ? ?Descriptions of Associations:Loose ? ?Orientation:Partial ? ?Thought Content:Illogical ? ?History of Schizophrenia/Schizoaffective disorder:Yes ? ?Duration of Psychotic Symptoms:Greater than six months ? ?Hallucinations:No data recorded ?Ideas of Reference:No data recorded ?Suicidal Thoughts:No data recorded ?Homicidal Thoughts:No  data recorded ? ?Sensorium  ?Memory:Immediate Poor ? ?Judgment:Impaired ? ?Insight:Lacking ? ? ?Executive Functions  ?Concentration:Poor ? ?Attention Span:Poor ? ?Recall:Poor ? ?Fund of Knowledge:Poor ? ?Language:Po

## 2021-10-16 NOTE — Plan of Care (Signed)
Patient remain alert and cooperative. Denies anxiety and depression. Denies SI, HI, AVH. Denies pain or discomfort at this time. Ate breakfast in the day room among peers and tolerated well. Compliant with due medication. Remain safe on the unit with Q15 minute safety check. ? ?Problem: Education: ?Goal: Knowledge of [facility] Education information/materials will improve ?Outcome: Progressing ?Goal: Emotional status will improve ?Outcome: Progressing ?Goal: Mental status will improve ?Outcome: Progressing ?Goal: Verbalization of understanding the information provided will improve ?Outcome: Progressing ?  ?Problem: Activity: ?Goal: Interest or engagement in activities will improve ?Outcome: Progressing ?Goal: Sleeping patterns will improve ?Outcome: Progressing ?  ?Problem: Coping: ?Goal: Ability to verbalize frustrations and anger appropriately will improve ?Outcome: Progressing ?Goal: Ability to demonstrate self-control will improve ?Outcome: Progressing ?  ?Problem: Health Behavior/Discharge Planning: ?Goal: Identification of resources available to assist in meeting health care needs will improve ?Outcome: Progressing ?Goal: Compliance with treatment plan for underlying cause of condition will improve ?Outcome: Progressing ?  ?Problem: Physical Regulation: ?Goal: Ability to maintain clinical measurements within normal limits will improve ?Outcome: Progressing ?  ?Problem: Safety: ?Goal: Periods of time without injury will increase ?Outcome: Progressing ?  ?

## 2021-10-17 DIAGNOSIS — F250 Schizoaffective disorder, bipolar type: Secondary | ICD-10-CM | POA: Diagnosis not present

## 2021-10-17 LAB — GLUCOSE, CAPILLARY
Glucose-Capillary: 150 mg/dL — ABNORMAL HIGH (ref 70–99)
Glucose-Capillary: 219 mg/dL — ABNORMAL HIGH (ref 70–99)
Glucose-Capillary: 116 mg/dL — ABNORMAL HIGH (ref 70–99)
Glucose-Capillary: 103 mg/dL — ABNORMAL HIGH (ref 70–99)

## 2021-10-17 NOTE — Progress Notes (Signed)
[facility] MD Progress Note ? ?10/17/2021 6:10 PM ?[place]  ?MRN:  [id number] ?Subjective:  pt seen and chart reviewed. Case discussed with staff.  ?Pt reports feeling sleep, she seems to doze off during our conversation, but arouseable.  She tolerates meds. No SI or HI.   ? ? ?Principal Problem: Schizoaffective disorder, bipolar type ([place]) ?Diagnosis: Principal Problem: ?  Schizoaffective disorder, bipolar type ([place]) ? ?Total Time spent with patient: 15 minutes ? ?Past Psychiatric History: [facility] ? ?Past Medical History:  ?Past Medical History:  ?Diagnosis Date  ? Depression   ? Diabetes mellitus without complication ([place])   ? Diabetes mellitus, type II ([place])   ? Hypertension   ? Schizophrenia ([place])   ? History reviewed. No pertinent surgical history. ?Family History:  ?Family History  ?Problem Relation Age of Onset  ? Diabetes Mother   ? Glaucoma Mother   ? Heart disease Mother   ? Heart attack Mother   ? Depression Mother   ? Hypertension Mother   ? Diabetes Father   ? Glaucoma Father   ? Hypertension Father   ? Diabetes Sister   ? Depression Sister   ? Post-traumatic stress disorder Sister   ? ? ?Social History:  ?Social History  ? ?Substance and Sexual Activity  ?Alcohol Use No  ? Alcohol/week: 0.0 standard drinks  ?   ?Social History  ? ?Substance and Sexual Activity  ?Drug Use No  ?  ?Social History  ? ?Socioeconomic History  ? Marital status: Single  ?  Spouse name: Not on file  ? Number of children: Not on file  ? Years of education: Not on file  ? Highest education level: Not on file  ?Occupational History  ? Not on file  ?Tobacco Use  ? Smoking status: Every Day  ?  Packs/day: 1.00  ?  Types: Cigarettes  ?  Start date: 04/21/1978  ? Smokeless tobacco: Never  ?Vaping Use  ? Vaping Use: Never used  ?Substance and Sexual Activity  ? Alcohol use: No  ?  Alcohol/week: 0.0 standard drinks  ? Drug use: No  ? Sexual activity: Not Currently  ?Other Topics Concern  ? Not on file  ?Social  History Narrative  ? Not on file  ? ?Social Determinants of Health  ? ?Financial Resource Strain: Not on file  ?Food Insecurity: Not on file  ?Transportation Needs: Not on file  ?Physical Activity: Not on file  ?Stress: Not on file  ?Social Connections: Not on file  ? ?Additional Social History:  ? ?Sleep: Good ? ?Appetite:  Good ? ?Current Medications: ?Current Facility-Administered Medications  ?Medication Dose Route Frequency Provider Last Rate Last Admin  ? acetaminophen (TYLENOL) tablet 650 mg  650 mg Oral Q6H PRN Caroline Sauger, NP   650 mg at 10/12/21 1031  ? alum & mag hydroxide-simeth (MAALOX/MYLANTA) 200-200-20 MG/5ML suspension 30 mL  30 mL Oral Q4H PRN Parks Ranger, DO   30 mL at 10/01/21 2215  ? amLODipine (NORVASC) tablet 10 mg  10 mg Oral Daily Parks Ranger, DO   10 mg at 10/17/21 0930  ? cyclobenzaprine (FLEXERIL) tablet 10 mg  10 mg Oral QHS Parks Ranger, DO   10 mg at 10/16/21 2108  ? escitalopram (LEXAPRO) tablet 10 mg  10 mg Oral Daily Parks Ranger, DO   10 mg at 10/17/21 4696  ? feeding supplement (GLUCERNA SHAKE) (GLUCERNA SHAKE) liquid 237 mL  237 mL Oral TID BM Clapacs, John  T, MD   237 mL at 10/17/21 1624  ? hydrALAZINE (APRESOLINE) tablet 50 mg  50 mg Oral TID Parks Ranger, DO   50 mg at 10/17/21 1625  ? hydrochlorothiazide (HYDRODIURIL) tablet 25 mg  25 mg Oral Daily Parks Ranger, DO   25 mg at 10/17/21 1610  ? insulin aspart (novoLOG) injection 2 Units  2 Units Subcutaneous TID WC Parks Ranger, DO   2 Units at 10/17/21 1625  ? linagliptin (TRADJENTA) tablet 5 mg  5 mg Oral Daily Parks Ranger, DO   5 mg at 10/17/21 9604  ? OLANZapine (ZYPREXA) tablet 10 mg  10 mg Oral QHS Parks Ranger, DO   10 mg at 10/16/21 2109  ? ondansetron (ZOFRAN) tablet 4 mg  4 mg Oral Q6H PRN Parks Ranger, DO      ? Or  ? ondansetron (ZOFRAN) injection 4 mg  4 mg Intravenous Q6H PRN Parks Ranger, DO      ? polyethylene glycol (MIRALAX / GLYCOLAX) packet 17 g  17 g Oral Daily PRN Parks Ranger, DO      ? traZODone (DESYREL) tablet 100 mg  100 mg Oral QHS Parks Ranger, DO   100 mg at 10/16/21 2108  ? ? ?Lab Results:  ?Results for orders placed or performed during the hospital encounter of 09/29/21 (from the past 48 hour(s))  ?Glucose, capillary     Status: Abnormal  ? Collection Time: 10/15/21  9:07 PM  ?Result Value Ref Range  ? Glucose-Capillary 150 (H) 70 - 99 mg/dL  ?  Comment: Glucose reference range applies only to samples taken after fasting for at least 8 hours.  ?Glucose, capillary     Status: Abnormal  ? Collection Time: 10/16/21  7:44 AM  ?Result Value Ref Range  ? Glucose-Capillary 183 (H) 70 - 99 mg/dL  ?  Comment: Glucose reference range applies only to samples taken after fasting for at least 8 hours.  ?Glucose, capillary     Status: Abnormal  ? Collection Time: 10/16/21 11:41 AM  ?Result Value Ref Range  ? Glucose-Capillary 178 (H) 70 - 99 mg/dL  ?  Comment: Glucose reference range applies only to samples taken after fasting for at least 8 hours.  ?Glucose, capillary     Status: Abnormal  ? Collection Time: 10/16/21  4:24 PM  ?Result Value Ref Range  ? Glucose-Capillary 151 (H) 70 - 99 mg/dL  ?  Comment: Glucose reference range applies only to samples taken after fasting for at least 8 hours.  ?Glucose, capillary     Status: Abnormal  ? Collection Time: 10/16/21  8:35 PM  ?Result Value Ref Range  ? Glucose-Capillary 106 (H) 70 - 99 mg/dL  ?  Comment: Glucose reference range applies only to samples taken after fasting for at least 8 hours.  ? Comment 1 Notify RN   ?Glucose, capillary     Status: Abnormal  ? Collection Time: 10/17/21  7:50 AM  ?Result Value Ref Range  ? Glucose-Capillary 150 (H) 70 - 99 mg/dL  ?  Comment: Glucose reference range applies only to samples taken after fasting for at least 8 hours.  ?Glucose, capillary     Status: Abnormal  ? Collection  Time: 10/17/21 11:42 AM  ?Result Value Ref Range  ? Glucose-Capillary 219 (H) 70 - 99 mg/dL  ?  Comment: Glucose reference range applies only to samples taken after fasting for at least 8 hours.  ?Glucose,  capillary     Status: Abnormal  ? Collection Time: 10/17/21  4:21 PM  ?Result Value Ref Range  ? Glucose-Capillary 116 (H) 70 - 99 mg/dL  ?  Comment: Glucose reference range applies only to samples taken after fasting for at least 8 hours.  ? ? ?Blood Alcohol level:  ?Lab Results  ?Component Value Date  ? ETH <10 09/20/2021  ? ETH <10 10/13/2020  ? ? ?Metabolic Disorder Labs: ?Lab Results  ?Component Value Date  ? HGBA1C 8.0 (H) 09/27/2021  ? MPG 182.9 09/27/2021  ? MPG 180.03 10/13/2020  ? ?No results found for: PROLACTIN ?Lab Results  ?Component Value Date  ? CHOL 267 (H) 10/13/2020  ? TRIG 180 (H) 10/13/2020  ? HDL 86 10/13/2020  ? CHOLHDL 3.1 10/13/2020  ? VLDL 36 10/13/2020  ? LDLCALC 145 (H) 10/13/2020  ? LDLCALC 158 (H) 02/29/2020  ? ? ?Physical Findings: ?AIMS:  , ,  ,  ,    ?CIWA:    ?COWS:    ? ?Musculoskeletal: ?Strength & Muscle Tone: within normal limits ?Gait & Station: normal ?Patient leans: N/A ? ?Psychiatric Specialty Exam: ? ?Presentation  ?General Appearance: Bizarre; Disheveled ? ?Eye Contact:Fair ? ?Speech:Garbled ? ?Speech Volume:Decreased ? ?Handedness:Right ? ? ?Mood and Affect  ?Mood:No data recorded ?Affect:Flat ? ? ?Thought Process  ?Thought Processes:Disorganized ? ?Descriptions of Associations:Loose ? ?Orientation:Partial ? ?Thought Content:Illogical ? ?History of Schizophrenia/Schizoaffective disorder:Yes ? ?Duration of Psychotic Symptoms:Greater than six months ? ?Hallucinations:No data recorded ?Ideas of Reference:No data recorded ?Suicidal Thoughts:No data recorded ?Homicidal Thoughts:No data recorded ? ?Sensorium  ?Memory:Immediate Poor ? ?Judgment:Impaired ? ?Insight:Lacking ? ? ?Executive Functions  ?Concentration:Poor ? ?Attention Span:Poor ? ?Recall:Poor ? ?Fund of  Knowledge:Poor ? ?Language:Poor ? ? ?Psychomotor Activity  ?Psychomotor Activity:No data recorded ? ?Assets  ?Assets:Housing; Social Support ? ? ?Sleep  ?Sleep:No data recorded ? ? ?Physical Exam: ?Physical Exam ?Vitals

## 2021-10-17 NOTE — Plan of Care (Signed)
Patient remains alert and oriented X3 with soft and delayed speech. Continues to require assistance with ADL's. Ambulatory with slow and unsteady gait. Denies pain or discomfort at this time. Denise depression and anxiety. Denies SI, HI, and AVH. Patient encouraged to ask for assistance. Ate breakfast in the day room with good appetite. AM blood sugar 150- 2u of novolog given. Patient is currently in bed and in no apparent distress. Q 15 minute safety checks continue. ?  ? ? ? ?Problem: Education: ?Goal: Knowledge of  General Education information/materials will improve ?Outcome: Progressing ?Goal: Emotional status will improve ?Outcome: Progressing ?Goal: Mental status will improve ?Outcome: Progressing ?Goal: Verbalization of understanding the information provided will improve ?Outcome: Progressing ?  ?Problem: Activity: ?Goal: Interest or engagement in activities will improve ?Outcome: Progressing ?Goal: Sleeping patterns will improve ?Outcome: Progressing ?  ?Problem: Coping: ?Goal: Ability to verbalize frustrations and anger appropriately will improve ?Outcome: Progressing ?Goal: Ability to demonstrate self-control will improve ?Outcome: Progressing ?  ?Problem: Health Behavior/Discharge Planning: ?Goal: Identification of resources available to assist in meeting health care needs will improve ?Outcome: Progressing ?Goal: Compliance with treatment plan for underlying cause of condition will improve ?Outcome: Progressing ?  ?Problem: Physical Regulation: ?Goal: Ability to maintain clinical measurements within normal limits will improve ?Outcome: Progressing ?  ?Problem: Safety: ?Goal: Periods of time without injury will increase ?Outcome: Progressing ?  ?

## 2021-10-17 NOTE — Plan of Care (Signed)
  Problem: Education: Goal: Knowledge of [facility] Education information/materials will improve Outcome: Progressing Goal: Emotional status will improve Outcome: Progressing Goal: Mental status will improve Outcome: Progressing Goal: Verbalization of understanding the information provided will improve Outcome: Progressing   Problem: Activity: Goal: Interest or engagement in activities will improve Outcome: Progressing Goal: Sleeping patterns will improve Outcome: Progressing   Problem: Coping: Goal: Ability to verbalize frustrations and anger appropriately will improve Outcome: Progressing Goal: Ability to demonstrate self-control will improve Outcome: Progressing   Problem: Health Behavior/Discharge Planning: Goal: Identification of resources available to assist in meeting health care needs will improve Outcome: Progressing Goal: Compliance with treatment plan for underlying cause of condition will improve Outcome: Progressing   Problem: Physical Regulation: Goal: Ability to maintain clinical measurements within normal limits will improve Outcome: Progressing   Problem: Safety: Goal: Periods of time without injury will increase Outcome: Progressing   

## 2021-10-17 NOTE — [facility] Group Notes (Signed)
[place] Group Notes:  (Nursing/MHT/Case Management/Adjunct) ? ?Date:  10/17/2021  ?Time:  10:00 AM ? ?Type of Therapy:  Psychoeducational Skills ? ?Participation Level:  Did Not Attend ? ?Participation Quality:   N/A ? ?Affect:   N/A ? ?Cognitive:   N/A ? ?Insight:  None ? ?Engagement in Group:   N/A ? ?Modes of Intervention:   N/A ? ?Summary of Progress/Problems: ?The pt did not attend group. MHT went to gather pt and she agreed to come but remained in bed. ?Juliette Alcide ?10/17/2021, 11:50 AM ?

## 2021-10-17 NOTE — Group Note (Signed)
[facility] LCSW Group Therapy Note ? ? ?Group Date: 10/17/2021 ?Start Time: 1320 ?End Time: 8127 ? ? ?Type of Therapy and Topic: Group Therapy: Avoiding Self-Sabotaging and Enabling Behaviors ? ?Participation Level: Did Not Attend ? ?Mood: ? ?Description of Group:  ?In this group, patients will learn how to identify obstacles, self-sabotaging and enabling behaviors, as well as: what are they, why do we do them and what needs these behaviors meet. Discuss unhealthy relationships and how to have positive healthy boundaries with those that sabotage and enable. Explore aspects of self-sabotage and enabling in yourself and how to limit these self-destructive behaviors in everyday life. ? ? ?Therapeutic Goals: ?1. Patient will identify one obstacle that relates to self-sabotage and enabling behaviors ?2. Patient will identify one personal self-sabotaging or enabling behavior they did prior to admission ?3. Patient will state a plan to change the above identified behavior ?4. Patient will demonstrate ability to communicate their needs through discussion and/or role play.  ? ? ?Summary of Patient Progress: Due to limited staffing, group was not held on the unit.  ? ? ? ?Therapeutic Modalities:  ?Cognitive Behavioral Therapy ?Person-Centered Therapy ?Motivational Interviewing ? ? ? ?Kenna Gilbert Fathima Bartl, LCSWA ?

## 2021-10-17 NOTE — Progress Notes (Signed)
Patient bed until 930 pm and I told her to get up for snack/supplemental drink. Pt voided a large amount of urine on the floor in the dayroom. She denies SI/HI and AVH. Pt slow to process and response to verbal cues due to developmental delays. Pt has mild lower extremity edema and of lt hand due to chronic kidney problems. Pt does not discuss her thoughts, she sits quietly with not interaction with peers or staff.  Pt able to follow directions, but  she just move slowly. No signs of hyper or hypoglycemia.  ? ?

## 2021-10-18 DIAGNOSIS — F250 Schizoaffective disorder, bipolar type: Secondary | ICD-10-CM | POA: Diagnosis not present

## 2021-10-18 LAB — GLUCOSE, CAPILLARY
Glucose-Capillary: 131 mg/dL — ABNORMAL HIGH (ref 70–99)
Glucose-Capillary: 141 mg/dL — ABNORMAL HIGH (ref 70–99)
Glucose-Capillary: 177 mg/dL — ABNORMAL HIGH (ref 70–99)
Glucose-Capillary: 245 mg/dL — ABNORMAL HIGH (ref 70–99)

## 2021-10-18 LAB — CREATININE, SERUM
Creatinine, Ser: 2.45 mg/dL — ABNORMAL HIGH (ref 0.44–1.00)
GFR, Estimated: 22 mL/min — ABNORMAL LOW (ref >60)

## 2021-10-18 NOTE — Plan of Care (Signed)
?  Problem: Education: ?Goal: Knowledge of [facility] Education information/materials will improve ?10/18/2021 1028 by Nolon Bussing, RN ?Outcome: Progressing ?10/18/2021 1026 by Nolon Bussing, RN ?Outcome: Progressing ?Goal: Emotional status will improve ?10/18/2021 1028 by Nolon Bussing, RN ?Outcome: Progressing ?10/18/2021 1026 by Nolon Bussing, RN ?Outcome: Progressing ?Goal: Mental status will improve ?10/18/2021 1028 by Nolon Bussing, RN ?Outcome: Progressing ?10/18/2021 1026 by Nolon Bussing, RN ?Outcome: Progressing ?Goal: Verbalization of understanding the information provided will improve ?10/18/2021 1028 by Nolon Bussing, RN ?Outcome: Progressing ?10/18/2021 1026 by Nolon Bussing, RN ?Outcome: Progressing ?  ?Problem: Education: ?Goal: Emotional status will improve ?10/18/2021 1028 by Nolon Bussing, RN ?Outcome: Progressing ?10/18/2021 1026 by Nolon Bussing, RN ?Outcome: Progressing ?  ?Problem: Activity: ?Goal: Interest or engagement in activities will improve ?10/18/2021 1028 by Nolon Bussing, RN ?Outcome: Progressing ?10/18/2021 1026 by Nolon Bussing, RN ?Outcome: Progressing ?Goal: Sleeping patterns will improve ?10/18/2021 1028 by Nolon Bussing, RN ?Outcome: Progressing ?10/18/2021 1026 by Nolon Bussing, RN ?Outcome: Progressing ?  ?Problem: Coping: ?Goal: Ability to verbalize frustrations and anger appropriately will improve ?10/18/2021 1028 by Nolon Bussing, RN ?Outcome: Progressing ?10/18/2021 1026 by Nolon Bussing, RN ?Outcome: Progressing ?Goal: Ability to demonstrate self-control will improve ?10/18/2021 1028 by Nolon Bussing, RN ?Outcome: Progressing ?10/18/2021 1026 by Nolon Bussing, RN ?Outcome: Progressing ?  ?Problem: Safety: ?Goal: Periods of time without injury will increase ?10/18/2021 1028 by Nolon Bussing, RN ?Outcome: Progressing ?10/18/2021 1026 by Nolon Bussing, RN ?Outcome: Progressing ?  ?

## 2021-10-18 NOTE — Plan of Care (Signed)
Patient remains alert and oriented X3 with soft and delayed speech. Continues to require assistance with ADL's. Ambulatory with slow and unsteady gait. Denies pain or discomfort at this time. Denise depression and anxiety. Denies SI, HI, and AVH. Patient encouraged to ask for assistance. Ate breakfast in the day room with good appetite. AM blood sugar 150- 2u of novolog given. Patient is currently in bed and in no apparent distress. Q 15 minute safety checks continue. ? ? ?

## 2021-10-18 NOTE — Group Note (Signed)
LCSW Group Therapy Note ? ?Group Date: 10/18/2021 ?Start Time: 1315 ?End Time: 6712 ? ? ?Type of Therapy and Topic:  Group Therapy - Healthy vs Unhealthy Coping Skills ? ?Participation Level:  Did Not Attend  ? ?Description of Group ?The focus of this group was to determine what unhealthy coping techniques typically are used by group members and what healthy coping techniques would be helpful in coping with various problems. Patients were guided in becoming aware of the differences between healthy and unhealthy coping techniques. Patients were asked to identify 2-3 healthy coping skills they would like to learn to use more effectively. ? ?Therapeutic Goals ?Patients learned that coping is what human beings do all day long to deal with various situations in their lives ?Patients defined and discussed healthy vs unhealthy coping techniques ?Patients identified their preferred coping techniques and identified whether these were healthy or unhealthy ?Patients determined 2-3 healthy coping skills they would like to become more familiar with and use more often. ?Patients provided support and ideas to each other ? ? ?Summary of Patient Progress: Patient did not attend group despite encouraged participation. ? ? ? ?Therapeutic Modalities ?Cognitive Behavioral Therapy ?Motivational Interviewing ? ?Kenna Gilbert Jibreel Fedewa, LCSWA ?10/18/2021  3:20 PM   ?

## 2021-10-18 NOTE — Progress Notes (Signed)
Pt encouraged to eat on evening shift. At 874 pm glucose 103 and 12 am 245. ON call notified and stated to inform diabetes doctors in the am. Pt urinated on herself in the dayroom and needed assistance to clean herself up. ?

## 2021-10-18 NOTE — Progress Notes (Addendum)
[facility] MD Progress Note ? ?10/18/2021 1:06 PM ?Vowinckel  ?MRN:  [id number] ?Subjective:  pt seen and chart reviewed. Case discussed with staff.  ?Pt is sitting in the dayroom, with very flat affect.  When approach,  she reports doing "ok", did not want to engage in any further conversation.  ? ?Denied SI or Hi,  slept well.  ? ?RN reported significantly fluctuating BG. Will contact Diabetes Educator again for insulin management.  ? ? ?Principal Problem: Schizoaffective disorder, bipolar type ([place]) ?Diagnosis: Principal Problem: ?  Schizoaffective disorder, bipolar type ([place]) ? ?Total Time spent with patient: 15 minutes ? ?Past Psychiatric History: [place] [facility] ? ?Past Medical History:  ?Past Medical History:  ?Diagnosis Date  ? Depression   ? Diabetes mellitus without complication ([place])   ? Diabetes mellitus, type II ([place])   ? Hypertension   ? Schizophrenia ([place])   ? History reviewed. No pertinent surgical history. ?Family History:  ?Family History  ?Problem Relation Age of Onset  ? Diabetes Mother   ? Glaucoma Mother   ? Heart disease Mother   ? Heart attack Mother   ? Depression Mother   ? Hypertension Mother   ? Diabetes Father   ? Glaucoma Father   ? Hypertension Father   ? Diabetes Sister   ? Depression Sister   ? Post-traumatic stress disorder Sister   ? ? ?Social History:  ?Social History  ? ?Substance and Sexual Activity  ?Alcohol Use No  ? Alcohol/week: 0.0 standard drinks  ?   ?Social History  ? ?Substance and Sexual Activity  ?Drug Use No  ?  ?Social History  ? ?Socioeconomic History  ? Marital status: Single  ?  Spouse name: Not on file  ? Number of children: Not on file  ? Years of education: Not on file  ? Highest education level: Not on file  ?Occupational History  ? Not on file  ?Tobacco Use  ? Smoking status: Every Day  ?  Packs/day: 1.00  ?  Types: Cigarettes  ?  Start date: 04/21/1978  ? Smokeless tobacco: Never  ?Vaping Use  ? Vaping Use: Never used  ?Substance and Sexual  Activity  ? Alcohol use: No  ?  Alcohol/week: 0.0 standard drinks  ? Drug use: No  ? Sexual activity: Not Currently  ?Other Topics Concern  ? Not on file  ?Social History Narrative  ? Not on file  ? ?Social Determinants of Health  ? ?Financial Resource Strain: Not on file  ?Food Insecurity: Not on file  ?Transportation Needs: Not on file  ?Physical Activity: Not on file  ?Stress: Not on file  ?Social Connections: Not on file  ? ?Additional Social History:  ? ?Sleep: Good ? ?Appetite:  Good ? ?Current Medications: ?Current Facility-Administered Medications  ?Medication Dose Route Frequency Provider Last Rate Last Admin  ? acetaminophen (TYLENOL) tablet 650 mg  650 mg Oral Q6H PRN Caroline Sauger, NP   650 mg at 10/12/21 1031  ? alum & mag hydroxide-simeth (MAALOX/MYLANTA) 200-200-20 MG/5ML suspension 30 mL  30 mL Oral Q4H PRN Parks Ranger, DO   30 mL at 10/01/21 2215  ? amLODipine (NORVASC) tablet 10 mg  10 mg Oral Daily Parks Ranger, DO   10 mg at 10/18/21 8115  ? cyclobenzaprine (FLEXERIL) tablet 10 mg  10 mg Oral QHS Parks Ranger, DO   10 mg at 10/17/21 2151  ? escitalopram (LEXAPRO) tablet 10 mg  10 mg Oral Daily Caren Griffins  Edward, DO   10 mg at 10/18/21 5409  ? feeding supplement (GLUCERNA SHAKE) (GLUCERNA SHAKE) liquid 237 mL  237 mL Oral TID BM Clapacs, John T, MD   237 mL at 10/18/21 0910  ? hydrALAZINE (APRESOLINE) tablet 50 mg  50 mg Oral TID Parks Ranger, DO   50 mg at 10/18/21 8119  ? hydrochlorothiazide (HYDRODIURIL) tablet 25 mg  25 mg Oral Daily Parks Ranger, DO   25 mg at 10/18/21 1478  ? insulin aspart (novoLOG) injection 2 Units  2 Units Subcutaneous TID WC Parks Ranger, DO   2 Units at 10/18/21 1216  ? linagliptin (TRADJENTA) tablet 5 mg  5 mg Oral Daily Parks Ranger, DO   5 mg at 10/18/21 2956  ? OLANZapine (ZYPREXA) tablet 10 mg  10 mg Oral QHS Parks Ranger, DO   10 mg at 10/17/21 2152  ?  ondansetron (ZOFRAN) tablet 4 mg  4 mg Oral Q6H PRN Parks Ranger, DO      ? Or  ? ondansetron (ZOFRAN) injection 4 mg  4 mg Intravenous Q6H PRN Parks Ranger, DO      ? polyethylene glycol (MIRALAX / GLYCOLAX) packet 17 g  17 g Oral Daily PRN Parks Ranger, DO      ? traZODone (DESYREL) tablet 100 mg  100 mg Oral QHS Parks Ranger, DO   100 mg at 10/17/21 2151  ? ? ?Lab Results:  ?Results for orders placed or performed during the hospital encounter of 09/29/21 (from the past 48 hour(s))  ?Glucose, capillary     Status: Abnormal  ? Collection Time: 10/16/21  4:24 PM  ?Result Value Ref Range  ? Glucose-Capillary 151 (H) 70 - 99 mg/dL  ?  Comment: Glucose reference range applies only to samples taken after fasting for at least 8 hours.  ?Glucose, capillary     Status: Abnormal  ? Collection Time: 10/16/21  8:35 PM  ?Result Value Ref Range  ? Glucose-Capillary 106 (H) 70 - 99 mg/dL  ?  Comment: Glucose reference range applies only to samples taken after fasting for at least 8 hours.  ? Comment 1 Notify RN   ?Glucose, capillary     Status: Abnormal  ? Collection Time: 10/17/21  7:50 AM  ?Result Value Ref Range  ? Glucose-Capillary 150 (H) 70 - 99 mg/dL  ?  Comment: Glucose reference range applies only to samples taken after fasting for at least 8 hours.  ?Glucose, capillary     Status: Abnormal  ? Collection Time: 10/17/21 11:42 AM  ?Result Value Ref Range  ? Glucose-Capillary 219 (H) 70 - 99 mg/dL  ?  Comment: Glucose reference range applies only to samples taken after fasting for at least 8 hours.  ?Glucose, capillary     Status: Abnormal  ? Collection Time: 10/17/21  4:21 PM  ?Result Value Ref Range  ? Glucose-Capillary 116 (H) 70 - 99 mg/dL  ?  Comment: Glucose reference range applies only to samples taken after fasting for at least 8 hours.  ?Glucose, capillary     Status: Abnormal  ? Collection Time: 10/17/21  8:47 PM  ?Result Value Ref Range  ? Glucose-Capillary 103 (H) 70  - 99 mg/dL  ?  Comment: Glucose reference range applies only to samples taken after fasting for at least 8 hours.  ? Comment 1 Notify RN   ? Comment 2 Document in Chart   ?Glucose, capillary     Status:  Abnormal  ? Collection Time: 10/18/21 12:01 AM  ?Result Value Ref Range  ? Glucose-Capillary 245 (H) 70 - 99 mg/dL  ?  Comment: Glucose reference range applies only to samples taken after fasting for at least 8 hours.  ? Comment 1 Document in Chart   ? Comment 2 Call MD NNP PA CNM   ?Creatinine, serum     Status: Abnormal  ? Collection Time: 10/18/21  5:29 AM  ?Result Value Ref Range  ? Creatinine, Ser 2.45 (H) 0.44 - 1.00 mg/dL  ? GFR, Estimated 22 (L) >60 mL/min  ?  Comment: (NOTE) ?Calculated using the CKD-EPI Creatinine Equation (2021) ?Performed at [facility], [place], ?[place] [address] ?  ? ? ?Blood Alcohol level:  ?Lab Results  ?Component Value Date  ? ETH <10 09/20/2021  ? ETH <10 10/13/2020  ? ? ?Metabolic Disorder Labs: ?Lab Results  ?Component Value Date  ? HGBA1C 8.0 (H) 09/27/2021  ? MPG 182.9 09/27/2021  ? MPG 180.03 10/13/2020  ? ?No results found for: PROLACTIN ?Lab Results  ?Component Value Date  ? CHOL 267 (H) 10/13/2020  ? TRIG 180 (H) 10/13/2020  ? HDL 86 10/13/2020  ? CHOLHDL 3.1 10/13/2020  ? VLDL 36 10/13/2020  ? LDLCALC 145 (H) 10/13/2020  ? LDLCALC 158 (H) 02/29/2020  ? ? ?Physical Findings: ?AIMS:  , ,  ,  ,    ?CIWA:    ?COWS:    ? ?Musculoskeletal: ?Strength & Muscle Tone: within normal limits ?Gait & Station: normal ?Patient leans: N/A ? ?Psychiatric Specialty Exam: ? ?Presentation  ?General Appearance: Bizarre; Disheveled ? ?Eye Contact:Fair ? ?Speech:Garbled ? ?Speech Volume:Decreased ? ?Handedness:Right ? ? ?Mood and Affect  ?Mood:No data recorded ?Affect:Flat ? ? ?Thought Process  ?Thought Processes:Disorganized ? ?Descriptions of Associations:Loose ? ?Orientation:Partial ? ?Thought Content:Illogical ? ?History of Schizophrenia/Schizoaffective  disorder:Yes ? ?Duration of Psychotic Symptoms:Greater than six months ? ?Hallucinations:No data recorded ?Ideas of Reference:No data recorded ?Suicidal Thoughts:No data recorded ?Homicidal Thoughts:No data recorded ? ?Sensorium

## 2021-10-18 NOTE — [facility] Group Notes (Signed)
Pt refused group : yoga in the chair  ?

## 2021-10-19 ENCOUNTER — Inpatient Hospital Stay: Payer: Medicare Other

## 2021-10-19 DIAGNOSIS — F250 Schizoaffective disorder, bipolar type: Secondary | ICD-10-CM | POA: Diagnosis not present

## 2021-10-19 LAB — CBC WITH DIFFERENTIAL/PLATELET
Abs Immature Granulocytes: 0.04 10*3/uL (ref 0.00–0.07)
Basophils Absolute: 0 10*3/uL (ref 0.0–0.1)
Basophils Relative: 0 %
Eosinophils Absolute: 0 10*3/uL (ref 0.0–0.5)
Eosinophils Relative: 0 %
HCT: 31.7 % — ABNORMAL LOW (ref 36.0–46.0)
Hemoglobin: 9.7 g/dL — ABNORMAL LOW (ref 12.0–15.0)
Immature Granulocytes: 0 %
Lymphocytes Relative: 13 %
Lymphs Abs: 1.5 10*3/uL (ref 0.7–4.0)
MCH: 26.6 pg (ref 26.0–34.0)
MCHC: 30.6 g/dL (ref 30.0–36.0)
MCV: 86.8 fL (ref 80.0–100.0)
Monocytes Absolute: 1.7 10*3/uL — ABNORMAL HIGH (ref 0.1–1.0)
Monocytes Relative: 14 %
Neutro Abs: 8.9 10*3/uL — ABNORMAL HIGH (ref 1.7–7.7)
Neutrophils Relative %: 73 %
Platelets: 411 10*3/uL — ABNORMAL HIGH (ref 150–400)
RBC: 3.65 MIL/uL — ABNORMAL LOW (ref 3.87–5.11)
RDW: 14.1 % (ref 11.5–15.5)
WBC: 12.2 10*3/uL — ABNORMAL HIGH (ref 4.0–10.5)
nRBC: 0 % (ref 0.0–0.2)

## 2021-10-19 LAB — COMPREHENSIVE METABOLIC PANEL
ALT: 20 U/L (ref 0–44)
AST: 27 U/L (ref 15–41)
Albumin: 3.1 g/dL — ABNORMAL LOW (ref 3.5–5.0)
Alkaline Phosphatase: 93 U/L (ref 38–126)
Anion gap: 13 (ref 5–15)
BUN: 78 mg/dL — ABNORMAL HIGH (ref 8–23)
CO2: 26 mmol/L (ref 22–32)
Calcium: 9.6 mg/dL (ref 8.9–10.3)
Chloride: 98 mmol/L (ref 98–111)
Creatinine, Ser: 2.59 mg/dL — ABNORMAL HIGH (ref 0.44–1.00)
GFR, Estimated: 20 mL/min — ABNORMAL LOW (ref >60)
Glucose, Bld: 192 mg/dL — ABNORMAL HIGH (ref 70–99)
Potassium: 4.2 mmol/L (ref 3.5–5.1)
Sodium: 137 mmol/L (ref 135–145)
Total Bilirubin: 0.3 mg/dL (ref 0.3–1.2)
Total Protein: 7.9 g/dL (ref 6.5–8.1)

## 2021-10-19 LAB — GLUCOSE, CAPILLARY
Glucose-Capillary: 167 mg/dL — ABNORMAL HIGH (ref 70–99)
Glucose-Capillary: 421 mg/dL — ABNORMAL HIGH (ref 70–99)
Glucose-Capillary: 316 mg/dL — ABNORMAL HIGH (ref 70–99)
Glucose-Capillary: 129 mg/dL — ABNORMAL HIGH (ref 70–99)
Glucose-Capillary: 179 mg/dL — ABNORMAL HIGH (ref 70–99)

## 2021-10-19 LAB — LACTIC ACID, PLASMA: Lactic Acid, Venous: 1.5 mmol/L (ref 0.5–1.9)

## 2021-10-19 MED ORDER — SORBITOL 70 % SOLN
960.0000 mL | TOPICAL_OIL | Freq: Every day | ORAL | Status: DC | PRN
  Administered 2021-10-19: 960 mL via RECTAL
  Filled 2021-10-19: qty 473
  Filled 2021-10-19: qty 240

## 2021-10-19 MED ORDER — INSULIN ASPART 100 UNIT/ML IJ SOLN
2.0000 [IU] | Freq: Once | INTRAMUSCULAR | Status: AC
  Administered 2021-10-19: 2 [IU] via SUBCUTANEOUS

## 2021-10-19 MED ORDER — INSULIN ASPART 100 UNIT/ML IJ SOLN
4.0000 [IU] | Freq: Three times a day (TID) | INTRAMUSCULAR | Status: DC
  Administered 2021-10-21 – 2021-11-10 (×56): 4 [IU] via SUBCUTANEOUS
  Filled 2021-10-19 (×57): qty 1

## 2021-10-19 MED ORDER — SENNOSIDES-DOCUSATE SODIUM 8.6-50 MG PO TABS
2.0000 | ORAL_TABLET | Freq: Once | ORAL | Status: AC
  Administered 2021-10-19: 2 via ORAL
  Filled 2021-10-19: qty 2

## 2021-10-19 MED ORDER — INSULIN ASPART 100 UNIT/ML IJ SOLN: 2.0000 [IU] | Freq: Once | INTRAMUSCULAR | Status: DC

## 2021-10-19 MED ORDER — GLYCERIN (LAXATIVE) 2 G RE SUPP
1.0000 | Freq: Every day | RECTAL | Status: DC | PRN
  Administered 2021-10-19: 1 via RECTAL
  Filled 2021-10-19 (×2): qty 1

## 2021-10-19 NOTE — Progress Notes (Signed)
Patient presents A&O x4 with flat and blunted affect.  Speech soft with delayed response.  Denies AVH, SI, HI or pain.  Compliant with all evening meds.  Patient c/o constipation.  Miramax given earlier today.  Prune juice given at this time.  Will monitor for results.  Patient spent most of the evening isolated to her room.  Ambulating independent with steady gait.  Denies needs or complaints.  Will continue to monitor with Q 15 min safey checks. ?

## 2021-10-19 NOTE — Plan of Care (Signed)
Patient presents A&O x3 with flat and blunted affect.  Speech soft with delayed response.  Denies AVH, SI, HI, anxiety or depression.   Compliant with all evening meds.  Patient c/o severe constipation.  Abd x-ray today results showed large amt of stool in abd and pelvis. Will administer SMOG as ordered. Patient spent most of the evening isolated to her room.  Ambulating independent with steady gait.  Will continue to monitor with Q 15 min safey checks. ? ?Problem: Education: ?Goal: Knowledge of [place] [facility] Education information/materials will improve ?Outcome: Progressing ?Goal: Emotional status will improve ?Outcome: Progressing ?Goal: Verbalization of understanding the information provided will improve ?Outcome: Progressing ?  ?Problem: Activity: ?Goal: Sleeping patterns will improve ?Outcome: Progressing ?  ?Problem: Health Behavior/Discharge Planning: ?Goal: Compliance with treatment plan for underlying cause of condition will improve ?Outcome: Progressing ?  ?

## 2021-10-19 NOTE — Progress Notes (Signed)
Recreation Therapy Notes ? ? ?Date: 10/19/2021   ?Time: 1:15pm   ?  ?Location: Craft room    ?  ?Behavioral response: N/A ?  ?Intervention Topic:  Relaxation  ?  ?Discussion/Intervention:  ?Patient refused to attend group. ?  ?Clinical Observations/Feedback: ?Patient refused to attend group.  ?  ?Mehar Sagen LRT/CTRS  ?  ? ? ? ? ? ? ? ?Alicia Rogers ?10/19/2021 1:49 PM ?

## 2021-10-19 NOTE — Group Note (Signed)
LCSW Group Therapy Note ? ?Group Date: 10/19/2021 ?Start Time: 1330 ?End Time: 1430 ? ? ?Type of Therapy and Topic:  Group Therapy - How To Cope with Nervousness about Discharge  ? ?Participation Level:  Did Not Attend  ? ?Description of Group ?This process group involved identification of patients' feelings about discharge. Some of them are scheduled to be discharged soon, while others are new admissions, but each of them was asked to share thoughts and feelings surrounding discharge from the hospital. One common theme was that they are excited at the prospect of going home, while another was that many of them are apprehensive about sharing why they were hospitalized. Patients were given the opportunity to discuss these feelings with their peers in preparation for discharge. ? ?Therapeutic Goals ? ?Patient will identify their overall feelings about pending discharge. ?Patient will think about how they might proactively address issues that they believe will once again arise once they get home (i.e. with parents). ?Patients will participate in discussion about having hope for change. ? ? ?Summary of Patient Progress:  Patient did not attend group despite encouraged participation.  ? ? ?Therapeutic Modalities ?Cognitive Behavioral Therapy ? ? ?Durenda Hurt, LCSWA ?10/19/2021  2:49 PM   ?

## 2021-10-19 NOTE — Progress Notes (Signed)
SMOG enema given as ordered with minimal results.  Large amt of stool visualized at rectum.  Patient unable to pass stool after SMOG.  Assisted patient to [facility].  Patient passed small amt of brown, soft stool.  Patient continues to c/o constipation.  Patient unable to sit due to rectal fullness and discomfort.  Assisted pt to lay on side with brief on.  Will allow patient to rest and monitor Q 15 min safety checks.   ?

## 2021-10-19 NOTE — Progress Notes (Signed)
Patient was taken to abd x-ray. Patient returned and is sitting in the dayroom. Patient's CBG was rechecked and was 316. Dr. Louis Meckel notified. This RN was informed to hold 12pm novolog dose due to patient not eating lunch and wait for evening dose of insulin.  ?

## 2021-10-19 NOTE — Progress Notes (Signed)
Patient refused dinner tray. Patient was educated on the importance of eating and drinking fluids regularly to maintain stable blood sugar levels. Patient continued to refuse, stating that her bottom hurt. Patient was offered fluids. Patient stated she did not want to drink. Patient was brought water to her room and encouraged to take sips. Patient's novolog meal coverage was held per orders. ?

## 2021-10-19 NOTE — Progress Notes (Signed)
Patient was given suppository PRN. Upon reassessment, patient stated that she went to the bathroom and had a small BM. Patient stated that it was a little bit, and it was "both hard and soft." However, patient states that she does not feel better. Patient was educated to ring call bell if she needed to have another BM. ?

## 2021-10-19 NOTE — Progress Notes (Signed)
Pt complains of constipation and  she stated, she has pain in buttocks/anus has wiped  with a small amount of from the area. RN observed stool around a her anal area. Her abd is distended and hard. MD and assigned RN notified of the patient's physical status. MD to order an abd xray to rule out an impaction and to assess her bowel/GI tract. Glucose level 421 per glucometer and the pt is symptomatic/shaking. This report was given to the assigned RN and the psy DO on duty. Pt is awake and talking. ?

## 2021-10-19 NOTE — Progress Notes (Signed)
[facility] MD Progress Note ? ?10/19/2021 10:23 AM ?Alicia Rogers  ?MRN:  [id number] ?Subjective:  Alicia Rogers is stable.  She is taking her medications as prescribed and denies any side effects.  It looks like her hand is less swollen.  She is sleeping well and her appetite is a little better.  She does not have any complaints.  Social work is still working on placement. ? ?Principal Problem: Schizoaffective disorder, bipolar type ([place]) ?Diagnosis: Principal Problem: ?  Schizoaffective disorder, bipolar type ([place]) ? ?Total Time spent with patient: 15 minutes ? ?Past Psychiatric History: [place] [facility] ? ?Past Medical History:  ?Past Medical History:  ?Diagnosis Date  ? Depression   ? Diabetes mellitus without complication ([place])   ? Diabetes mellitus, type II ([place])   ? Hypertension   ? Schizophrenia ([place])   ? History reviewed. No pertinent surgical history. ?Family History:  ?Family History  ?Problem Relation Age of Onset  ? Diabetes Mother   ? Glaucoma Mother   ? Heart disease Mother   ? Heart attack Mother   ? Depression Mother   ? Hypertension Mother   ? Diabetes Father   ? Glaucoma Father   ? Hypertension Father   ? Diabetes Sister   ? Depression Sister   ? Post-traumatic stress disorder Sister   ? ? ?Social History:  ?Social History  ? ?Substance and Sexual Activity  ?Alcohol Use No  ? Alcohol/week: 0.0 standard drinks  ?   ?Social History  ? ?Substance and Sexual Activity  ?Drug Use No  ?  ?Social History  ? ?Socioeconomic History  ? Marital status: Single  ?  Spouse name: Not on file  ? Number of children: Not on file  ? Years of education: Not on file  ? Highest education level: Not on file  ?Occupational History  ? Not on file  ?Tobacco Use  ? Smoking status: Every Day  ?  Packs/day: 1.00  ?  Types: Cigarettes  ?  Start date: 04/21/1978  ? Smokeless tobacco: Never  ?Vaping Use  ? Vaping Use: Never used  ?Substance and Sexual Activity  ? Alcohol use: No  ?  Alcohol/week: 0.0 standard drinks  ? Drug use:  No  ? Sexual activity: Not Currently  ?Other Topics Concern  ? Not on file  ?Social History Narrative  ? Not on file  ? ?Social Determinants of Health  ? ?Financial Resource Strain: Not on file  ?Food Insecurity: Not on file  ?Transportation Needs: Not on file  ?Physical Activity: Not on file  ?Stress: Not on file  ?Social Connections: Not on file  ? ?Additional Social History:  ?  ?  ?  ?  ?  ?  ?  ?  ?  ?  ?  ? ?Sleep: Good ? ?Appetite:  Good ? ?Current Medications: ?Current Facility-Administered Medications  ?Medication Dose Route Frequency Provider Last Rate Last Admin  ? acetaminophen (TYLENOL) tablet 650 mg  650 mg Oral Q6H PRN Caroline Sauger, NP   650 mg at 10/12/21 1031  ? alum & mag hydroxide-simeth (MAALOX/MYLANTA) 200-200-20 MG/5ML suspension 30 mL  30 mL Oral Q4H PRN Parks Ranger, DO   30 mL at 10/01/21 2215  ? amLODipine (NORVASC) tablet 10 mg  10 mg Oral Daily Parks Ranger, DO   10 mg at 10/19/21 4967  ? cyclobenzaprine (FLEXERIL) tablet 10 mg  10 mg Oral QHS Parks Ranger, DO   10 mg at 10/18/21 2125  ? escitalopram (  LEXAPRO) tablet 10 mg  10 mg Oral Daily Parks Ranger, DO   10 mg at 10/19/21 0355  ? feeding supplement (GLUCERNA SHAKE) (GLUCERNA SHAKE) liquid 237 mL  237 mL Oral TID BM Clapacs, John T, MD   237 mL at 10/19/21 0944  ? hydrALAZINE (APRESOLINE) tablet 50 mg  50 mg Oral TID Parks Ranger, DO   50 mg at 10/19/21 9741  ? hydrochlorothiazide (HYDRODIURIL) tablet 25 mg  25 mg Oral Daily Parks Ranger, DO   25 mg at 10/19/21 6384  ? insulin aspart (novoLOG) injection 2 Units  2 Units Subcutaneous TID WC Parks Ranger, DO   2 Units at 10/19/21 0802  ? linagliptin (TRADJENTA) tablet 5 mg  5 mg Oral Daily Parks Ranger, DO   5 mg at 10/19/21 5364  ? OLANZapine (ZYPREXA) tablet 10 mg  10 mg Oral QHS Parks Ranger, DO   10 mg at 10/18/21 2125  ? ondansetron (ZOFRAN) tablet 4 mg  4 mg Oral Q6H PRN  Parks Ranger, DO      ? Or  ? ondansetron (ZOFRAN) injection 4 mg  4 mg Intravenous Q6H PRN Parks Ranger, DO      ? polyethylene glycol (MIRALAX / GLYCOLAX) packet 17 g  17 g Oral Daily PRN Parks Ranger, DO   17 g at 10/18/21 1755  ? traZODone (DESYREL) tablet 100 mg  100 mg Oral QHS Parks Ranger, DO   100 mg at 10/18/21 2125  ? ? ?Lab Results:  ?Results for orders placed or performed during the hospital encounter of 09/29/21 (from the past 48 hour(s))  ?Glucose, capillary     Status: Abnormal  ? Collection Time: 10/17/21 11:42 AM  ?Result Value Ref Range  ? Glucose-Capillary 219 (H) 70 - 99 mg/dL  ?  Comment: Glucose reference range applies only to samples taken after fasting for at least 8 hours.  ?Glucose, capillary     Status: Abnormal  ? Collection Time: 10/17/21  4:21 PM  ?Result Value Ref Range  ? Glucose-Capillary 116 (H) 70 - 99 mg/dL  ?  Comment: Glucose reference range applies only to samples taken after fasting for at least 8 hours.  ?Glucose, capillary     Status: Abnormal  ? Collection Time: 10/17/21  8:47 PM  ?Result Value Ref Range  ? Glucose-Capillary 103 (H) 70 - 99 mg/dL  ?  Comment: Glucose reference range applies only to samples taken after fasting for at least 8 hours.  ? Comment 1 Notify RN   ? Comment 2 Document in Chart   ?Glucose, capillary     Status: Abnormal  ? Collection Time: 10/18/21 12:01 AM  ?Result Value Ref Range  ? Glucose-Capillary 245 (H) 70 - 99 mg/dL  ?  Comment: Glucose reference range applies only to samples taken after fasting for at least 8 hours.  ? Comment 1 Document in Chart   ? Comment 2 Call MD NNP PA CNM   ?Creatinine, serum     Status: Abnormal  ? Collection Time: 10/18/21  5:29 AM  ?Result Value Ref Range  ? Creatinine, Ser 2.45 (H) 0.44 - 1.00 mg/dL  ? GFR, Estimated 22 (L) >60 mL/min  ?  Comment: (NOTE) ?Calculated using the CKD-EPI Creatinine Equation (2021) ?Performed at [facility], [place], ?[place] [address] ?  ?Glucose, capillary     Status: Abnormal  ? Collection Time: 10/18/21  7:38 AM  ?Result  Value Ref Range  ? Glucose-Capillary 131 (H) 70 - 99 mg/dL  ?  Comment: Glucose reference range applies only to samples taken after fasting for at least 8 hours.  ?Glucose, capillary     Status: Abnormal  ? Collection Time: 10/18/21 11:44 AM  ?Result Value Ref Range  ? Glucose-Capillary 141 (H) 70 - 99 mg/dL  ?  Comment: Glucose reference range applies only to samples taken after fasting for at least 8 hours.  ?Glucose, capillary     Status: Abnormal  ? Collection Time: 10/18/21  4:20 PM  ?Result Value Ref Range  ? Glucose-Capillary 177 (H) 70 - 99 mg/dL  ?  Comment: Glucose reference range applies only to samples taken after fasting for at least 8 hours.  ?Glucose, capillary     Status: Abnormal  ? Collection Time: 10/19/21  7:51 AM  ?Result Value Ref Range  ? Glucose-Capillary 167 (H) 70 - 99 mg/dL  ?  Comment: Glucose reference range applies only to samples taken after fasting for at least 8 hours.  ? ? ?Blood Alcohol level:  ?Lab Results  ?Component Value Date  ? ETH <10 09/20/2021  ? ETH <10 10/13/2020  ? ? ?Metabolic Disorder Labs: ?Lab Results  ?Component Value Date  ? HGBA1C 8.0 (H) 09/27/2021  ? MPG 182.9 09/27/2021  ? MPG 180.03 10/13/2020  ? ?No results found for: PROLACTIN ?Lab Results  ?Component Value Date  ? CHOL 267 (H) 10/13/2020  ? TRIG 180 (H) 10/13/2020  ? HDL 86 10/13/2020  ? CHOLHDL 3.1 10/13/2020  ? VLDL 36 10/13/2020  ? LDLCALC 145 (H) 10/13/2020  ? LDLCALC 158 (H) 02/29/2020  ? ? ?Physical Findings: ?AIMS:  , ,  ,  ,    ?CIWA:    ?COWS:    ? ?Musculoskeletal: ?Strength & Muscle Tone: within normal limits ?Gait & Station: normal ?Patient leans: N/A ? ?Psychiatric Specialty Exam: ? ?Presentation  ?General Appearance: Bizarre; Disheveled ? ?Eye Contact:Fair ? ?Speech:Garbled ? ?Speech Volume:Decreased ? ?Handedness:Right ? ? ?Mood and Affect  ?Mood:No data  recorded ?Affect:Flat ? ? ?Thought Process  ?Thought Processes:Disorganized ? ?Descriptions of Associations:Loose ? ?Orientation:Partial ? ?Thought Content:Illogical ? ?History of Schizophrenia/Schizoaffective disorder:Yes ? ?[place]

## 2021-10-19 NOTE — Progress Notes (Signed)
Inpatient Diabetes Program Recommendations ? ?AACE/ADA: New Consensus Statement on Inpatient Glycemic Control (2015) ? ?Target Ranges:  Prepandial:   less than 140 mg/dL ?     Peak postprandial:   less than 180 mg/dL (1-2 hours) ?     Critically ill patients:  140 - 180 mg/dL  ? ? Latest Reference Range & Units 10/18/21 07:38 10/18/21 11:44 10/18/21 16:20 10/19/21 07:51  ?Glucose-Capillary 70 - 99 mg/dL 131 (H) ? ?2 units Novolog ? ?5 mg Tradjenta '@0908'$  ? 141 (H) ? ?2 units Novolog 177 (H) ? ?2 units Novolog 167 (H)  ?(H): Data is abnormally high ? ? ? ?Home DM Meds: None listed ?      Was getting Januvia 100 mg daily and Metformin 500 mg BID in the past ? ? ?Current Orders: Novolog 2 units TID with meals ?     Tradjenta 5 mg daily ? ? ? ? ?CBGs well controlled on current regimen ? ?No recommendations at this time ? ?Will follow ? ? ? ?--Will follow patient during hospitalization-- ? ?Wyn Quaker RN, MSN, CDE ?Diabetes Coordinator ?Inpatient Glycemic Control Team ?Team Pager: [phone] (8a-5p) ? ?

## 2021-10-20 DIAGNOSIS — F250 Schizoaffective disorder, bipolar type: Secondary | ICD-10-CM | POA: Diagnosis not present

## 2021-10-20 LAB — GLUCOSE, CAPILLARY
Glucose-Capillary: 211 mg/dL — ABNORMAL HIGH (ref 70–99)
Glucose-Capillary: 231 mg/dL — ABNORMAL HIGH (ref 70–99)
Glucose-Capillary: 148 mg/dL — ABNORMAL HIGH (ref 70–99)

## 2021-10-20 MED ORDER — DEXTROSE-NACL 2.5-0.45 % IV SOLN: INTRAVENOUS | Status: DC

## 2021-10-20 MED ORDER — SENNOSIDES-DOCUSATE SODIUM 8.6-50 MG PO TABS
2.0000 | ORAL_TABLET | Freq: Once | ORAL | Status: AC
  Administered 2021-10-20: 2 via ORAL
  Filled 2021-10-20: qty 2

## 2021-10-20 NOTE — Plan of Care (Signed)
Patient remain alert and oriented X3. Calm and cooperative during assessment. Patient denies anxiety and depression. Denies SI, HI, AVH. Patient report having a medium bowel movement this morning, still complain of feeling uncomfortable. Patient refused breakfast this morning. IV hydration in progress. 1:1 safety observation in place. Q15 minute safety checks continue.  ? ?Problem: Education: ?Goal: Knowledge of [facility] Education information/materials will improve ?Outcome: Progressing ?Goal: Emotional status will improve ?Outcome: Progressing ?Goal: Mental status will improve ?Outcome: Progressing ?Goal: Verbalization of understanding the information provided will improve ?Outcome: Progressing ?  ?Problem: Activity: ?Goal: Interest or engagement in activities will improve ?Outcome: Progressing ?Goal: Sleeping patterns will improve ?Outcome: Progressing ?  ?Problem: Coping: ?Goal: Ability to verbalize frustrations and anger appropriately will improve ?Outcome: Progressing ?Goal: Ability to demonstrate self-control will improve ?Outcome: Progressing ?  ?Problem: Health Behavior/Discharge Planning: ?Goal: Identification of resources available to assist in meeting health care needs will improve ?Outcome: Progressing ?Goal: Compliance with treatment plan for underlying cause of condition will improve ?Outcome: Progressing ?  ?Problem: Physical Regulation: ?Goal: Ability to maintain clinical measurements within normal limits will improve ?Outcome: Progressing ?  ?Problem: Safety: ?Goal: Periods of time without injury will increase ?Outcome: Progressing ?  ?

## 2021-10-20 NOTE — Progress Notes (Addendum)
Recreation Therapy Notes ? ? ?Date: 10/20/2021 ? ?Time: 1:30pm  ? ?Location: Day room  ? ?Behavioral response: N/A ? ?Intervention Topic: Life Planning  ? ?Discussion/Intervention: ?Patient unable to attend group. ? ?Clinical Observations/Feedback: ?Patient unable to attend group. ? ?Smith Mcnicholas LRT/CTRS ? ? ? ? ? ? ? ?Arsen Mangione ?10/20/2021 3:09 PM ?

## 2021-10-20 NOTE — Progress Notes (Signed)
Patient up to bathroom multiple times since SMOG.  Rectum is significantly dilated exposing large amount of stool that patient is attempting to pass without success.  Will notify MD in am. ?

## 2021-10-20 NOTE — BH IP Treatment Plan (Signed)
Interdisciplinary Treatment and Diagnostic Plan Update ? ?10/20/2021 ?Time of Session: 9:30AM ?[place] ?MRN: [id number] ? ?Principal Diagnosis: Schizoaffective disorder, bipolar type ([place]) ? ?Secondary Diagnoses: Principal Problem: ?  Schizoaffective disorder, bipolar type ([place]) ? ? ?Current Medications:  ?Current Facility-Administered Medications  ?Medication Dose Route Frequency Provider Last Rate Last Admin  ? acetaminophen (TYLENOL) tablet 650 mg  650 mg Oral Q6H PRN Caroline Sauger, NP   650 mg at 10/12/21 1031  ? alum & mag hydroxide-simeth (MAALOX/MYLANTA) 200-200-20 MG/5ML suspension 30 mL  30 mL Oral Q4H PRN Parks Ranger, DO   30 mL at 10/01/21 2215  ? amLODipine (NORVASC) tablet 10 mg  10 mg Oral Daily Parks Ranger, DO   10 mg at 10/20/21 1002  ? cyclobenzaprine (FLEXERIL) tablet 10 mg  10 mg Oral QHS Parks Ranger, DO   10 mg at 10/19/21 2123  ? dextrose 2.5 % and 0.45 % NaCl infusion   Intravenous Continuous Parks Ranger, DO 75 mL/hr at 10/20/21 1134 New Bag at 10/20/21 1134  ? escitalopram (LEXAPRO) tablet 10 mg  10 mg Oral Daily Parks Ranger, DO   10 mg at 10/20/21 1002  ? feeding supplement (GLUCERNA SHAKE) (GLUCERNA SHAKE) liquid 237 mL  237 mL Oral TID BM Clapacs, John T, MD   237 mL at 10/20/21 1005  ? Glycerin (Adult) 2 g suppository 1 suppository  1 suppository Rectal Daily PRN Parks Ranger, DO   1 suppository at 10/19/21 1538  ? hydrALAZINE (APRESOLINE) tablet 50 mg  50 mg Oral TID Parks Ranger, DO   50 mg at 10/20/21 3976  ? hydrochlorothiazide (HYDRODIURIL) tablet 25 mg  25 mg Oral Daily Parks Ranger, DO   25 mg at 10/20/21 1002  ? insulin aspart (novoLOG) injection 4 Units  4 Units Subcutaneous TID WC Parks Ranger, DO      ? linagliptin (TRADJENTA) tablet 5 mg  5 mg Oral Daily Parks Ranger, DO   5 mg at 10/20/21 1002  ? OLANZapine (ZYPREXA) tablet 10 mg  10 mg Oral QHS  Parks Ranger, DO   10 mg at 10/19/21 2123  ? ondansetron (ZOFRAN) tablet 4 mg  4 mg Oral Q6H PRN Parks Ranger, DO      ? Or  ? ondansetron (ZOFRAN) injection 4 mg  4 mg Intravenous Q6H PRN Parks Ranger, DO      ? polyethylene glycol (MIRALAX / GLYCOLAX) packet 17 g  17 g Oral Daily PRN Parks Ranger, DO   17 g at 10/18/21 1755  ? sorbitol, milk of mag, mineral oil, glycerin (SMOG) enema  960 mL Rectal Daily PRN Parks Ranger, DO   960 mL at 10/19/21 2202  ? traZODone (DESYREL) tablet 100 mg  100 mg Oral QHS Parks Ranger, DO   100 mg at 10/19/21 2124  ? ?PTA Medications: ?Medications Prior to Admission  ?Medication Sig Dispense Refill Last Dose  ? amLODipine (NORVASC) 10 MG tablet Take 1 tablet (10 mg total) by mouth daily.     ? [EXPIRED] cefdinir (OMNICEF) 300 MG capsule Take 1 capsule (300 mg total) by mouth daily for 3 days. 3 capsule 0   ? hydrALAZINE (APRESOLINE) 50 MG tablet Take 1 tablet (50 mg total) by mouth 3 (three) times daily.     ? hydrochlorothiazide (HYDRODIURIL) 12.5 MG tablet Take 1 tablet (12.5 mg total) by mouth daily.     ? ? ?Patient Stressors:  Health problems   ? ?Patient Strengths: Religious Affiliation  ? ?Treatment Modalities: Medication Management, Group therapy, Case management,  ?1 to 1 session with clinician, Psychoeducation, Recreational therapy. ? ? ?Physician Treatment Plan for Primary Diagnosis: Schizoaffective disorder, bipolar type ([place]) ?Long Term Goal(s): Improvement in symptoms so as ready for discharge  ? ?Short Term Goals: Ability to identify changes in lifestyle to reduce recurrence of condition will improve ?Ability to verbalize feelings will improve ?Ability to disclose and discuss suicidal ideas ?Ability to demonstrate self-control will improve ?Ability to identify and develop effective coping behaviors will improve ?Ability to maintain clinical measurements within normal limits will improve ?Compliance with  prescribed medications will improve ?Ability to identify triggers associated with substance abuse/mental health issues will improve ? ?Medication Management: Evaluate patient's response, side effects, and tolerance of medication regimen. ? ?Therapeutic Interventions: 1 to 1 sessions, Unit Group sessions and Medication administration. ? ?Evaluation of Outcomes: Progressing ? ?Physician Treatment Plan for Secondary Diagnosis: Principal Problem: ?  Schizoaffective disorder, bipolar type ([place]) ? ?Long Term Goal(s): Improvement in symptoms so as ready for discharge  ? ?Short Term Goals: Ability to identify changes in lifestyle to reduce recurrence of condition will improve ?Ability to verbalize feelings will improve ?Ability to disclose and discuss suicidal ideas ?Ability to demonstrate self-control will improve ?Ability to identify and develop effective coping behaviors will improve ?Ability to maintain clinical measurements within normal limits will improve ?Compliance with prescribed medications will improve ?Ability to identify triggers associated with substance abuse/mental health issues will improve    ? ?Medication Management: Evaluate patient's response, side effects, and tolerance of medication regimen. ? ?Therapeutic Interventions: 1 to 1 sessions, Unit Group sessions and Medication administration. ? ?Evaluation of Outcomes: Progressing ? ? ?RN Treatment Plan for Primary Diagnosis: Schizoaffective disorder, bipolar type ([place]) ?Long Term Goal(s): Knowledge of disease and therapeutic regimen to maintain health will improve ? ?Short Term Goals: Ability to remain free from injury will improve, Ability to verbalize frustration and anger appropriately will improve, Ability to demonstrate self-control, Ability to participate in decision making will improve, Ability to verbalize feelings will improve, Ability to identify and develop effective coping behaviors will improve, and Compliance with prescribed medications  will improve ? ?Medication Management: RN will administer medications as ordered by provider, will assess and evaluate patient's response and provide education to patient for prescribed medication. RN will report any adverse and/or side effects to prescribing provider. ? ?Therapeutic Interventions: 1 on 1 counseling sessions, Psychoeducation, Medication administration, Evaluate responses to treatment, Monitor vital signs and CBGs as ordered, Perform/monitor CIWA, COWS, AIMS and Fall Risk screenings as ordered, Perform wound care treatments as ordered. ? ?Evaluation of Outcomes: Progressing ? ? ?LCSW Treatment Plan for Primary Diagnosis: Schizoaffective disorder, bipolar type ([place]) ?Long Term Goal(s): Safe transition to appropriate next level of care at discharge, Engage patient in therapeutic group addressing interpersonal concerns. ? ?Short Term Goals: Engage patient in aftercare planning with referrals and resources, Increase social support, Increase ability to appropriately verbalize feelings, Increase emotional regulation, Facilitate acceptance of mental health diagnosis and concerns, Identify triggers associated with mental health/substance abuse issues, and Increase skills for wellness and recovery ? ?Therapeutic Interventions: Assess for all discharge needs, 1 to 1 time with Education officer, museum, Explore available resources and support systems, Assess for adequacy in community support network, Educate family and significant other(s) on suicide prevention, Complete Psychosocial Assessment, Interpersonal group therapy. ? ?Evaluation of Outcomes: Progressing ? ? ?Progress in Treatment: ?Attending groups: Yes. ?Participating in groups:  Yes. ?Taking medication as prescribed: Yes. ?Toleration medication: Yes. ?Family/Significant other contact made: Yes, individual(s) contacted:  SPE completed with [name] ?Patient understands diagnosis: No. ?Discussing patient identified problems/goals with staff: Yes. ?Medical  problems stabilized or resolved: Yes. ?Denies suicidal/homicidal ideation: Yes. ?Issues/concerns per patient self-inventory: No. ?Other: None ? ?New problem(s) identified: No, Describe:  None ? ?New Short Term/Lo

## 2021-10-20 NOTE — Progress Notes (Signed)
[facility] MD Progress Note  10/20/2021 10:18 AM Alicia Rogers  MRN:  [id number] Subjective: The patient has been constipated.  Flat plate x-ray showed stool in the colon.  She was given a smoggy enema last night with some success this morning.  She states that she does feel better but her abdomen is tender.  Her BUN and creatinine have increased, also.  I spoke to the nurses about starting a 1 time IV and rechecking her lab work tomorrow morning.  Social work continues to look for placement facility for discharge.  She denies any auditory hallucinations.  She is been taking her medications as prescribed and denies any side effects.  She has been cooperative, pleasant, and compliant.  Principal Problem: Schizoaffective disorder, bipolar type (HCC) Diagnosis: Principal Problem:   Schizoaffective disorder, bipolar type (HCC)  Total Time spent with patient: 15 minutes  Past Psychiatric History: Schizophrenia/ [employer]  Past Medical History:  Past Medical History:  Diagnosis Date   Depression    Diabetes mellitus without complication (HCC)    Diabetes mellitus, type II (HCC)    Hypertension    Schizophrenia (HCC)    History reviewed. No pertinent surgical history. Family History:  Family History  Problem Relation Age of Onset   Diabetes Mother    Glaucoma Mother    Heart disease Mother    Heart attack Mother    Depression Mother    Hypertension Mother    Diabetes Father    Glaucoma Father    Hypertension Father    Diabetes Sister    Depression Sister    Post-traumatic stress disorder Sister     Social History:  Social History   Substance and Sexual Activity  Alcohol Use No   Alcohol/week: 0.0 standard drinks     Social History   Substance and Sexual Activity  Drug Use No    Social History   Socioeconomic History   Marital status: Single    Spouse name: Not on file   Number of children: Not on file   Years of education: Not on file   Highest  education level: Not on file  Occupational History   Not on file  Tobacco Use   Smoking status: Every Day    Packs/day: 1.00    Types: Cigarettes    Start date: 04/21/1978   Smokeless tobacco: Never  Vaping Use   Vaping Use: Never used  Substance and Sexual Activity   Alcohol use: No    Alcohol/week: 0.0 standard drinks   Drug use: No   Sexual activity: Not Currently  Other Topics Concern   Not on file  Social History Narrative   Not on file   Social Determinants of Health   Financial Resource Strain: Not on file  Food Insecurity: Not on file  Transportation Needs: Not on file  Physical Activity: Not on file  Stress: Not on file  Social Connections: Not on file   Additional Social History:                         Sleep: Good  Appetite:  Poor  Current Medications: Current Facility-Administered Medications  Medication Dose Route Frequency Provider Last Rate Last Admin   acetaminophen (TYLENOL) tablet 650 mg  650 mg Oral Q6H PRN Gillermo Murdoch, NP   650 mg at 10/12/21 1031   alum & mag hydroxide-simeth (MAALOX/MYLANTA) 200-200-20 MG/5ML suspension 30 mL  30 mL Oral Q4H PRN Sarina Ill, DO  30 mL at 10/01/21 2215   amLODipine (NORVASC) tablet 10 mg  10 mg Oral Daily Sarina Ill, DO   10 mg at 10/20/21 1002   cyclobenzaprine (FLEXERIL) tablet 10 mg  10 mg Oral QHS Sarina Ill, DO   10 mg at 10/19/21 2123   dextrose 2.5 % and 0.45 % NaCl infusion   Intravenous Continuous Sarina Ill, DO       escitalopram (LEXAPRO) tablet 10 mg  10 mg Oral Daily Sarina Ill, DO   10 mg at 10/20/21 1002   feeding supplement (GLUCERNA SHAKE) (GLUCERNA SHAKE) liquid 237 mL  237 mL Oral TID BM Clapacs, John T, MD   237 mL at 10/20/21 1005   Glycerin (Adult) 2 g suppository 1 suppository  1 suppository Rectal Daily PRN Sarina Ill, DO   1 suppository at 10/19/21 1538   hydrALAZINE (APRESOLINE) tablet 50 mg  50  mg Oral TID Sarina Ill, DO   50 mg at 10/20/21 0849   hydrochlorothiazide (HYDRODIURIL) tablet 25 mg  25 mg Oral Daily Sarina Ill, DO   25 mg at 10/20/21 1002   insulin aspart (novoLOG) injection 4 Units  4 Units Subcutaneous TID WC Sarina Ill, DO       linagliptin (TRADJENTA) tablet 5 mg  5 mg Oral Daily Sarina Ill, DO   5 mg at 10/20/21 1002   OLANZapine (ZYPREXA) tablet 10 mg  10 mg Oral QHS Sarina Ill, DO   10 mg at 10/19/21 2123   ondansetron (ZOFRAN) tablet 4 mg  4 mg Oral Q6H PRN Sarina Ill, DO       Or   ondansetron [facility]) injection 4 mg  4 mg Intravenous Q6H PRN Sarina Ill, DO       polyethylene glycol (MIRALAX / GLYCOLAX) packet 17 g  17 g Oral Daily PRN Sarina Ill, DO   17 g at 10/18/21 1755   sorbitol, milk of mag, mineral oil, glycerin (SMOG) enema  960 mL Rectal Daily PRN Sarina Ill, DO   960 mL at 10/19/21 2202   traZODone (DESYREL) tablet 100 mg  100 mg Oral QHS Sarina Ill, DO   100 mg at 10/19/21 2124    Lab Results:  Results for orders placed or performed during the hospital encounter of 09/29/21 (from the past 48 hour(s))  Glucose, capillary     Status: Abnormal   Collection Time: 10/18/21 11:44 AM  Result Value Ref Range   Glucose-Capillary 141 (H) 70 - 99 mg/dL    Comment: Glucose reference range applies only to samples taken after fasting for at least 8 hours.  Glucose, capillary     Status: Abnormal   Collection Time: 10/18/21  4:20 PM  Result Value Ref Range   Glucose-Capillary 177 (H) 70 - 99 mg/dL    Comment: Glucose reference range applies only to samples taken after fasting for at least 8 hours.  Glucose, capillary     Status: Abnormal   Collection Time: 10/19/21  7:51 AM  Result Value Ref Range   Glucose-Capillary 167 (H) 70 - 99 mg/dL    Comment: Glucose reference range applies only to samples taken after fasting for at least 8  hours.  Glucose, capillary     Status: Abnormal   Collection Time: 10/19/21 11:15 AM  Result Value Ref Range   Glucose-Capillary 421 (H) 70 - 99 mg/dL    Comment: Glucose reference range applies only  to samples taken after fasting for at least 8 hours.  Glucose, capillary     Status: Abnormal   Collection Time: 10/19/21 12:13 PM  Result Value Ref Range   Glucose-Capillary 316 (H) 70 - 99 mg/dL    Comment: Glucose reference range applies only to samples taken after fasting for at least 8 hours.  CBC with Differential/Platelet     Status: Abnormal   Collection Time: 10/19/21  1:13 PM  Result Value Ref Range   WBC 12.2 (H) 4.0 - 10.5 K/uL   RBC 3.65 (L) 3.87 - 5.11 MIL/uL   Hemoglobin 9.7 (L) 12.0 - 15.0 g/dL   HCT 40.9 (L) 81.1 - 91.4 %   MCV 86.8 80.0 - 100.0 fL   MCH 26.6 26.0 - 34.0 pg   MCHC 30.6 30.0 - 36.0 g/dL   RDW 78.2 95.6 - 21.3 %   Platelets 411 (H) 150 - 400 K/uL   nRBC 0.0 0.0 - 0.2 %   Neutrophils Relative % 73 %   Neutro Abs 8.9 (H) 1.7 - 7.7 K/uL   Lymphocytes Relative 13 %   Lymphs Abs 1.5 0.7 - 4.0 K/uL   Monocytes Relative 14 %   Monocytes Absolute 1.7 (H) 0.1 - 1.0 K/uL   Eosinophils Relative 0 %   Eosinophils Absolute 0.0 0.0 - 0.5 K/uL   Basophils Relative 0 %   Basophils Absolute 0.0 0.0 - 0.1 K/uL   Immature Granulocytes 0 %   Abs Immature Granulocytes 0.04 0.00 - 0.07 K/uL    Comment: Performed at [facility], [address] [place], [place] [address]  Comprehensive metabolic panel     Status: Abnormal   Collection Time: 10/19/21  1:13 PM  Result Value Ref Range   Sodium 137 135 - 145 mmol/L   Potassium 4.2 3.5 - 5.1 mmol/L   Chloride 98 98 - 111 mmol/L   CO2 26 22 - 32 mmol/L   Glucose, Bld 192 (H) 70 - 99 mg/dL    Comment: Glucose reference range applies only to samples taken after fasting for at least 8 hours.   BUN 78 (H) 8 - 23 mg/dL   Creatinine, Ser 8.46 (H) 0.44 - 1.00 mg/dL   Calcium 9.6 8.9 - 96.2 mg/dL   Total Protein 7.9  6.5 - 8.1 g/dL   Albumin 3.1 (L) 3.5 - 5.0 g/dL   AST 27 15 - 41 U/L   ALT 20 0 - 44 U/L   Alkaline Phosphatase 93 38 - 126 U/L   Total Bilirubin 0.3 0.3 - 1.2 mg/dL   GFR, Estimated 20 (L) >60 mL/min    Comment: (NOTE) Calculated using the CKD-EPI Creatinine Equation (2021)    Anion gap 13 5 - 15    Comment: Performed at [facility], [address] [place], [place] [address]  Lactic acid, plasma     Status: None   Collection Time: 10/19/21  1:13 PM  Result Value Ref Range   Lactic Acid, Venous 1.5 0.5 - 1.9 mmol/L    Comment: Performed at [facility], [address] [place], [place] [address]  Glucose, capillary     Status: Abnormal   Collection Time: 10/19/21  4:45 PM  Result Value Ref Range   Glucose-Capillary 129 (H) 70 - 99 mg/dL    Comment: Glucose reference range applies only to samples taken after fasting for at least 8 hours.  Glucose, capillary     Status: Abnormal   Collection Time: 10/19/21  7:31 PM  Result Value Ref Range   Glucose-Capillary 179 (H) 70 - 99 mg/dL    Comment: Glucose reference range applies only to samples taken after fasting for at least 8 hours.  Glucose, capillary     Status: Abnormal   Collection Time: 10/20/21  8:16 AM  Result Value Ref Range   Glucose-Capillary 211 (H) 70 - 99 mg/dL    Comment: Glucose reference range applies only to samples taken after fasting for at least 8 hours.    Blood Alcohol level:  Lab Results  Component Value Date   ETH <10 09/20/2021   ETH <10 10/13/2020    Metabolic Disorder Labs: Lab Results  Component Value Date   HGBA1C 8.0 (H) 09/27/2021   MPG 182.9 09/27/2021   MPG 180.03 10/13/2020   No results found for: PROLACTIN Lab Results  Component Value Date   CHOL 267 (H) 10/13/2020   TRIG 180 (H) 10/13/2020   HDL 86 10/13/2020   CHOLHDL 3.1 10/13/2020   VLDL 36 10/13/2020   LDLCALC 145 (H) 10/13/2020   LDLCALC 158 (H) 02/29/2020    Physical Findings: AIMS:  , ,  ,  ,     CIWA:    COWS:     Musculoskeletal: Strength & Muscle Tone: within normal limits Gait & Station: normal Patient leans: N/A  Psychiatric Specialty Exam:  Presentation  General Appearance: Bizarre; Disheveled  Eye Contact:Fair  Speech:Garbled  Speech Volume:Decreased  Handedness:Right   Mood and Affect  Mood:No data recorded Affect:Flat   Thought Process  Thought Processes:Disorganized  Descriptions of Associations:Loose  Orientation:Partial  Thought Content:Illogical  History of Schizophrenia/Schizoaffective disorder:Yes  Duration of Psychotic Symptoms:Greater than six months  Hallucinations:No data recorded Ideas of Reference:No data recorded Suicidal Thoughts:No data recorded Homicidal Thoughts:No data recorded  Sensorium  Memory:Immediate Poor  Judgment:Impaired  Insight:Lacking   Executive Functions  Concentration:Poor  Attention Span:Poor  Recall:Poor  Fund of Knowledge:Poor  Language:Poor   Psychomotor Activity  Psychomotor Activity:No data recorded  Assets  Assets:Housing; Social Support   Sleep  Sleep:No data recorded   Physical Exam: Physical Exam Vitals and nursing note reviewed.  Constitutional:      Appearance: Normal appearance. She is normal weight.  Abdominal:     General: Bowel sounds are normal.     Tenderness: There is abdominal tenderness.  Neurological:     General: No focal deficit present.     Mental Status: She is alert and oriented to person, place, and time.  Psychiatric:        Attention and Perception: Attention and perception normal.        Mood and Affect: Mood and affect normal.        Speech: Speech is delayed.        Behavior: Behavior normal. Behavior is cooperative.        Thought Content: Thought content normal.        Cognition and Memory: Cognition and memory normal.        Judgment: Judgment normal.   Review of Systems  Constitutional: Negative.   HENT: Negative.    Eyes:  Negative.   Respiratory: Negative.    Cardiovascular: Negative.   Gastrointestinal:  Positive for constipation.  Genitourinary: Negative.   Musculoskeletal: Negative.   Skin: Negative.   Neurological: Negative.   Endo/Heme/Allergies: Negative.   Psychiatric/Behavioral: Negative.    Blood pressure (!) 169/55, pulse 85, temperature 98.8 F (37.1 C), resp. rate 18, height 5' 2.99" (1.6 m), weight 46.9 kg, SpO2 98 %.  Body mass index is 18.34 kg/m.   Treatment Plan Summary: Daily contact with patient to assess and evaluate symptoms and progress in treatment, Medication management, and Plan start IV with 1 L of half normal saline at 75 mL/h.  Recheck CMP in the morning.  Senokot-S 2 tablets this morning.  Renae Mottley [place], DO 10/20/2021, 10:18 AM

## 2021-10-20 NOTE — [place] FL2 (Addendum)
?[place] MEDICAID FL2 LEVEL OF CARE SCREENING TOOL  ?  ? ?IDENTIFICATION  ?Patient Name: ?Alicia Rogers Birthdate: [birth year] Sex: female Admission Date (Current Location): ?09/29/2021  ?[place] and [place] Number: ? [place] ?  Facility and Address:  ?[facility], [address], [place], [place] [address] ?     Provider Number: ?[id number]  ?Attending Physician Name and Address:  ?Parks Ranger,* ? Relative Name and Phone Number:  ?Emara Lichter (Sister)   [phone] ?   ?Current Level of Care: ?Hospital Recommended Level of Care: ?[place] Prior Approval Number: ?  ? ?Date Approved/Denied: ?  PASRR Number: ?screen still running, MUST ID: [id number] ? ?Discharge Plan: ?Other (Comment) [facility]) ?  ? ?Current Diagnoses: ?Patient Active Problem List  ? Diagnosis Date Noted  ? Schizoaffective disorder, bipolar type ([place]) 09/29/2021  ? Hypertensive urgency 09/28/2021  ? Acute metabolic encephalopathy 71/69/6789  ? Left lower lobe pneumonia 09/27/2021  ? Sinus bradycardia 09/27/2021  ? Anemia in chronic kidney disease (CKD) 09/27/2021  ? Left arm swelling 09/27/2021  ? 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  ? Essential hypertension 02/28/2020  ? Back pain 02/28/2020  ? CKD (chronic kidney disease), stage IV ([place]) 02/28/2020  ? Hematuria 02/28/2020  ? HLD (hyperlipidemia) 02/28/2020  ? Compulsive tobacco user syndrome 10/11/2014  ? 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  ? Claudication ([place]) 06/09/2014  ? Schizoaffective disorder, depressive type ([place]) 06/09/2014  ? Controlled type 2 diabetes mellitus without complication ([place]) 38/05/1750  ? ? ?Orientation RESPIRATION BLADDER Height & Weight   ?  ?Self ? Normal Incontinent Weight: 103 lb 8 oz (46.9 kg) ?Height:  5' 2.99" (160 cm)  ?BEHAVIORAL SYMPTOMS/MOOD NEUROLOGICAL BOWEL NUTRITION STATUS  ?   (N/A) Incontinent Diet (diabetic diet)  ?AMBULATORY STATUS COMMUNICATION OF NEEDS Skin    ?Independent Verbally   ?  ?  ?  ?    ?     ?     ? ? ?Personal Care Assistance Level of Assistance  ?Bathing Bathing Assistance: Limited assistance ?  ?  ?   ? ?Functional Limitations Info  ?Speech   ?  ?Speech Info: Impaired (delayed responses)  ? ? ?SPECIAL CARE FACTORS FREQUENCY  ?Blood pressure Blood Pressure Frequency: x2 ?  ?  ?  ?  ?  ?  ?   ? ? ?Contractures Contractures Info: Not present  ? ? ?Additional Factors Info  ?Code Status Code Status Info: FULL ?  ?  ?  ?  ?   ? ?Current Medications (10/20/2021):  This is the current hospital active medication list ?Current Facility-Administered Medications  ?Medication Dose Route Frequency Provider Last Rate Last Admin  ? acetaminophen (TYLENOL) tablet 650 mg  650 mg Oral Q6H PRN Caroline Sauger, NP   650 mg at 10/12/21 1031  ? alum & mag hydroxide-simeth (MAALOX/MYLANTA) 200-200-20 MG/5ML suspension 30 mL  30 mL Oral Q4H PRN Parks Ranger, DO   30 mL at 10/01/21 2215  ? amLODipine (NORVASC) tablet 10 mg  10 mg Oral Daily Parks Ranger, DO   10 mg at 10/20/21 1002  ? cyclobenzaprine (FLEXERIL) tablet 10 mg  10 mg Oral QHS Parks Ranger, DO   10 mg at 10/19/21 2123  ? dextrose 2.5 % and 0.45 % NaCl infusion   Intravenous Continuous Parks Ranger, DO 75 mL/hr at 10/20/21 1134 New Bag at 10/20/21 1134  ? escitalopram (LEXAPRO) tablet 10 mg  10 mg Oral  Daily Parks Ranger, DO   10 mg at 10/20/21 1002  ? feeding supplement (GLUCERNA SHAKE) (GLUCERNA SHAKE) liquid 237 mL  237 mL Oral TID BM Clapacs, John T, MD   237 mL at 10/20/21 1415  ? Glycerin (Adult) 2 g suppository 1 suppository  1 suppository Rectal Daily PRN Parks Ranger, DO   1 suppository at 10/19/21 1538  ? hydrALAZINE (APRESOLINE) tablet 50 mg  50 mg Oral TID Parks Ranger, DO   50 mg at 10/20/21 1415  ? hydrochlorothiazide (HYDRODIURIL) tablet 25 mg  25 mg Oral Daily Parks Ranger, DO   25 mg at 10/20/21 1002  ? insulin  aspart (novoLOG) injection 4 Units  4 Units Subcutaneous TID WC Parks Ranger, DO      ? linagliptin (TRADJENTA) tablet 5 mg  5 mg Oral Daily Parks Ranger, DO   5 mg at 10/20/21 1002  ? OLANZapine (ZYPREXA) tablet 10 mg  10 mg Oral QHS Parks Ranger, DO   10 mg at 10/19/21 2123  ? ondansetron (ZOFRAN) tablet 4 mg  4 mg Oral Q6H PRN Parks Ranger, DO      ? Or  ? ondansetron (ZOFRAN) injection 4 mg  4 mg Intravenous Q6H PRN Parks Ranger, DO      ? polyethylene glycol (MIRALAX / GLYCOLAX) packet 17 g  17 g Oral Daily PRN Parks Ranger, DO   17 g at 10/18/21 1755  ? sorbitol, milk of mag, mineral oil, glycerin (SMOG) enema  960 mL Rectal Daily PRN Parks Ranger, DO   960 mL at 10/19/21 2202  ? traZODone (DESYREL) tablet 100 mg  100 mg Oral QHS Parks Ranger, DO   100 mg at 10/19/21 2124  ? ? ? ?Discharge Medications: ?Please see discharge summary for a list of discharge medications. ? ?Relevant Imaging Results: ? ?Relevant Lab Results: ? ? 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 ?Parys Elenbaas A [place], LCSWA ? ? ? ? ?

## 2021-10-20 NOTE — Progress Notes (Signed)
Patient consumed less than 50% of lunch. Insulin held per order parameter. IV hydration infusing no sign of infiltration noted. 1:1 safety observation continues. Q15 minute safety check in place. Will continue to monitor. ?

## 2021-10-20 NOTE — Progress Notes (Signed)
Inpatient Diabetes Program Recommendations ? ?AACE/ADA: New Consensus Statement on Inpatient Glycemic Control (2015) ? ?Target Ranges:  Prepandial:   less than 140 mg/dL ?     Peak postprandial:   less than 180 mg/dL (1-2 hours) ?     Critically ill patients:  140 - 180 mg/dL  ? ? Latest Reference Range & Units 10/19/21 07:51 10/19/21 11:15 10/19/21 12:13 10/19/21 16:45 10/19/21 19:31  ?Glucose-Capillary 70 - 99 mg/dL 167 (H) ? ?2 units Novolog ? ?5 mg Tradjenta ? 421 (H) ? ?2 units Novolog ? 316 (H) 129 (H) 179 (H)  ?(H): Data is abnormally high ? Latest Reference Range & Units 10/20/21 08:16 10/20/21 12:08  ?Glucose-Capillary 70 - 99 mg/dL 211 (H) 231 (H)  ?(H): Data is abnormally high ? ? ?Home DM Meds: None listed ?                            Was getting Januvia 100 mg daily and Metformin 500 mg BID in the past ?  ?  ?Current Orders: Novolog 4 units TID with meals ?                           Tradjenta 5 mg daily ?  ? ? ?Looks like pt refusing to eat meals at times per RN notes and refusing PO Glucerna supplements at times as well ? ?Not sure why CBG was so elevated yesterday at 11am?? ? ?CBGs 200s today ? ? ?MD- Please consider: ? ?1. Stop Novolog 4 units TID with meals (since PO intake inconsistent) ? ?2. Start Novolog Sensitive Correction Scale/ SSI (0-9 units) TID AC + HS ?Recommend Q4 hour SSI coverage if PO intake remains inconsistent ? ? ? ?--Will follow patient during hospitalization-- ? ?Wyn Quaker RN, MSN, CDE ?Diabetes Coordinator ?Inpatient Glycemic Control Team ?Team Pager: [phone] (8a-5p) ? ?

## 2021-10-21 DIAGNOSIS — F250 Schizoaffective disorder, bipolar type: Secondary | ICD-10-CM | POA: Diagnosis not present

## 2021-10-21 LAB — COMPREHENSIVE METABOLIC PANEL
ALT: 15 U/L (ref 0–44)
AST: 18 U/L (ref 15–41)
Albumin: 2.6 g/dL — ABNORMAL LOW (ref 3.5–5.0)
Alkaline Phosphatase: 87 U/L (ref 38–126)
Anion gap: 6 (ref 5–15)
BUN: 55 mg/dL — ABNORMAL HIGH (ref 8–23)
CO2: 28 mmol/L (ref 22–32)
Calcium: 8.7 mg/dL — ABNORMAL LOW (ref 8.9–10.3)
Chloride: 99 mmol/L (ref 98–111)
Creatinine, Ser: 2.17 mg/dL — ABNORMAL HIGH (ref 0.44–1.00)
GFR, Estimated: 25 mL/min — ABNORMAL LOW (ref >60)
Glucose, Bld: 155 mg/dL — ABNORMAL HIGH (ref 70–99)
Potassium: 3.6 mmol/L (ref 3.5–5.1)
Sodium: 133 mmol/L — ABNORMAL LOW (ref 135–145)
Total Bilirubin: 0.5 mg/dL (ref 0.3–1.2)
Total Protein: 6.9 g/dL (ref 6.5–8.1)

## 2021-10-21 LAB — GLUCOSE, CAPILLARY
Glucose-Capillary: 147 mg/dL — ABNORMAL HIGH (ref 70–99)
Glucose-Capillary: 141 mg/dL — ABNORMAL HIGH (ref 70–99)
Glucose-Capillary: 215 mg/dL — ABNORMAL HIGH (ref 70–99)
Glucose-Capillary: 180 mg/dL — ABNORMAL HIGH (ref 70–99)
Glucose-Capillary: 234 mg/dL — ABNORMAL HIGH (ref 70–99)

## 2021-10-21 MED ORDER — SENNOSIDES-DOCUSATE SODIUM 8.6-50 MG PO TABS
1.0000 | ORAL_TABLET | Freq: Every day | ORAL | Status: DC
  Administered 2021-10-21 – 2021-11-09 (×20): 1 via ORAL
  Filled 2021-10-21 (×20): qty 1

## 2021-10-21 MED ORDER — POLYETHYLENE GLYCOL 3350 17 G PO PACK
17.0000 g | PACK | Freq: Every day | ORAL | Status: DC
  Administered 2021-10-21 – 2021-11-10 (×17): 17 g via ORAL
  Filled 2021-10-21 (×20): qty 1

## 2021-10-21 NOTE — BHH Counselor (Signed)
CSW spoke with admissions director, Shirlee Limerick, [phone], regarding pt's status on her referral to [facility]. CSW provided additional requested documentation regarding H&P.  ? ?CSW will fax over additional medical notes for consideration to [phone] and follow up with Shirlee Limerick regarding referral acceptance.  ? ?Alicia Rogers, MSW, LCSW-A ?3/22/20234:12 PM  ?

## 2021-10-21 NOTE — BHH Group Notes (Signed)
Pt refused group Chair yoga ?

## 2021-10-21 NOTE — Group Note (Signed)
[place] LCSW Group Therapy Note ? ? ?Group Date: 10/21/2021 ?Start Time: 1415 ?End Time: 2197 ? ? ?Type of Therapy/Topic:  Group Therapy:  Emotion Regulation ? ?Participation Level:  Minimal  ? ?Mood: ? ?Description of Group:   ? The purpose of this group is to assist patients in learning to regulate negative emotions and experience positive emotions. Patients will be guided to discuss ways in which they have been vulnerable to their negative emotions. These vulnerabilities will be juxtaposed with experiences of positive emotions or situations, and patients challenged to use positive emotions to combat negative ones. Special emphasis will be placed on coping with negative emotions in conflict situations, and patients will process healthy conflict resolution skills. ? ?Therapeutic Goals: ?Patient will identify two positive emotions or experiences to reflect on in order to balance out negative emotions:  ?Patient will label two or more emotions that they find the most difficult to experience:  ?Patient will be able to demonstrate positive conflict resolution skills through discussion or role plays:  ? ?Summary of Patient Progress: ? ? ?Patient was present for the entirety of the group session. Patient was an active listener and participated in the topic of discussion but only when prompted. She stated that she practiced emotional regulation by taking her medication.  ? ? ? ?Therapeutic Modalities:   ?Cognitive Behavioral Therapy ?Feelings Identification ?Dialectical Behavioral Therapy ? ? ?Darnell Jeschke A [place], LCSWA ?

## 2021-10-21 NOTE — Progress Notes (Addendum)
Recreation Therapy Notes ? ?Date: 10/21/2021 ?  ?Time: 1:40 pm     ?  ?Location: Day room   ?  ?Behavioral response: Appropriate ?  ?Intervention Topic:  Stress Management   ?  ?Discussion/Intervention:  ?Group content on today was focused on stress. The group defined stress and way to cope with stress. Participants expressed how they know when they are stresses out. Individuals described the different ways they have to cope with stress. The group stated reasons why it is important to cope with stress. Patient explained what good stress is and some examples. The group participated in the intervention ?Stress Management?. Individuals were separated into two group and answered questions related to stress.  ?  ?Clinical Observations/Feedback: ?Patient came to group. ?Jahmel Flannagan LRT/CTRS  ?  ? ? ? ? ? ? ? ?Alyne Martinson ?10/21/2021 2:32 PM ?

## 2021-10-21 NOTE — Progress Notes (Signed)
Patient is sitting in the dayroom and is not observed to be in any distress. Patient remains on 1:1 observation for IV infusion. ?

## 2021-10-21 NOTE — Progress Notes (Signed)
Patient alert and oriented x 3. Denies SI, HI, AVH and pain. Patient compliant with medications. Patient remains on 1:1 for IV infusion. NAD noted. Will continue to monitor and update as needed. ? 10/20/21 2200  ?Psych Admission Type (Psych Patients Only)  ?Admission Status Voluntary  ?Psychosocial Assessment  ?Patient Complaints None  ?Eye Contact Brief  ?Facial Expression Flat  ?Affect Flat  ?Speech Soft  ?Interaction Childlike  ?Motor Activity Slow  ?Appearance/Hygiene Disheveled  ?Behavior Characteristics Cooperative  ?Mood Depressed  ?Thought Process  ?Coherency WDL  ?Content WDL  ?Delusions None reported or observed  ?Perception WDL  ?Hallucination None reported or observed  ?Judgment Impaired  ?Confusion Mild  ?Danger to Self  ?Current suicidal ideation? Denies  ?Danger to Others  ?Danger to Others None reported or observed  ?Danger to Others Abnormal  ?Harmful Behavior to others No threats or harm toward other people  ?Destructive Behavior No threats or harm toward property  ? ? ?

## 2021-10-21 NOTE — Progress Notes (Signed)
Patient denies SI, HI, and AVH. Patient reports having a BM yesterday, but still feels constipated. Patient denies pain at time of assessment. Patient's IV was assessed. There is no redness or swelling at site. Patient ate 50-60% of her breakfast, 4 units of insulin was given. Patient remains on 1:1 for IV infusion.  ?

## 2021-10-21 NOTE — Progress Notes (Signed)
[facility] MD Progress Note ? ?10/21/2021 12:45 PM ?[place]  ?MRN:  [id number] ?Subjective: Patient was seen today.  She has been dealing with constipation.  She states that she has gone some.  She was started on an IV yesterday because her BUN and creatinine had gone to 78 and 2.59.  When she went to medicine on 224 her BUN was 47 and her creatinine was 2.27 and when they discharged her it was 43 and 2.18 on 09/29/2021.  The internist told me that she has chronic kidney disease and that was her baseline.  We rechecked her lab work this morning and had returned to baseline to 55 and 2.17.  She is now eating and drinking.  She looks better but she states that she still feels bad as far as her stomach.  Social work still working on placement. ? ?Principal Problem: Schizoaffective disorder, bipolar type ([place]) ?Diagnosis: Principal Problem: ?  Schizoaffective disorder, bipolar type ([place]) ? ?Total Time spent with patient: 15 minutes ? ?Past Psychiatric History: [facility] ? ?Past Medical History:  ?Past Medical History:  ?Diagnosis Date  ? Depression   ? Diabetes mellitus without complication ([place])   ? Diabetes mellitus, type II ([place])   ? Hypertension   ? Schizophrenia ([place])   ? History reviewed. No pertinent surgical history. ?Family History:  ?Family History  ?Problem Relation Age of Onset  ? Diabetes Mother   ? Glaucoma Mother   ? Heart disease Mother   ? Heart attack Mother   ? Depression Mother   ? Hypertension Mother   ? Diabetes Father   ? Glaucoma Father   ? Hypertension Father   ? Diabetes Sister   ? Depression Sister   ? Post-traumatic stress disorder Sister   ? ? ?Social History:  ?Social History  ? ?Substance and Sexual Activity  ?Alcohol Use No  ? Alcohol/week: 0.0 standard drinks  ?   ?Social History  ? ?Substance and Sexual Activity  ?Drug Use No  ?  ?Social History  ? ?Socioeconomic History  ? Marital status: Single  ?  Spouse name: Not on file  ? Number of children: Not on file  ? Years  of education: Not on file  ? Highest education level: Not on file  ?Occupational History  ? Not on file  ?Tobacco Use  ? Smoking status: Every Day  ?  Packs/day: 1.00  ?  Types: Cigarettes  ?  Start date: 04/21/1978  ? Smokeless tobacco: Never  ?Vaping Use  ? Vaping Use: Never used  ?Substance and Sexual Activity  ? Alcohol use: No  ?  Alcohol/week: 0.0 standard drinks  ? Drug use: No  ? Sexual activity: Not Currently  ?Other Topics Concern  ? Not on file  ?Social History Narrative  ? Not on file  ? ?Social Determinants of Health  ? ?Financial Resource Strain: Not on file  ?Food Insecurity: Not on file  ?Transportation Needs: Not on file  ?Physical Activity: Not on file  ?Stress: Not on file  ?Social Connections: Not on file  ? ?Additional Social History:  ?  ?  ?  ?  ?  ?  ?  ?  ?  ?  ?  ? ?Sleep: Good ? ?Appetite:  Good ? ?Current Medications: ?Current Facility-Administered Medications  ?Medication Dose Route Frequency Provider Last Rate Last Admin  ? acetaminophen (TYLENOL) tablet 650 mg  650 mg Oral Q6H PRN Caroline Sauger, NP   650 mg at 10/12/21 1031  ?  alum & mag hydroxide-simeth (MAALOX/MYLANTA) 200-200-20 MG/5ML suspension 30 mL  30 mL Oral Q4H PRN Parks Ranger, DO   30 mL at 10/01/21 2215  ? amLODipine (NORVASC) tablet 10 mg  10 mg Oral Daily Parks Ranger, DO   10 mg at 10/21/21 1026  ? cyclobenzaprine (FLEXERIL) tablet 10 mg  10 mg Oral QHS Parks Ranger, DO   10 mg at 10/20/21 2117  ? dextrose 2.5 % and 0.45 % NaCl infusion   Intravenous Continuous Parks Ranger, DO 75 mL/hr at 10/21/21 0420 Infusion Verify at 10/21/21 0420  ? escitalopram (LEXAPRO) tablet 10 mg  10 mg Oral Daily Parks Ranger, DO   10 mg at 10/21/21 1026  ? feeding supplement (GLUCERNA SHAKE) (GLUCERNA SHAKE) liquid 237 mL  237 mL Oral TID BM Clapacs, Madie Ren[place], MD   237 mL at 10/21/21 1028  ? Glycerin (Adult) 2 g suppository 1 suppository  1 suppository Rectal Daily PRN Parks Ranger, DO   1 suppository at 10/19/21 1538  ? hydrALAZINE (APRESOLINE) tablet 50 mg  50 mg Oral TID Parks Ranger, DO   50 mg at 10/21/21 0174  ? hydrochlorothiazide (HYDRODIURIL) tablet 25 mg  25 mg Oral Daily Parks Ranger, DO   25 mg at 10/21/21 1026  ? insulin aspart (novoLOG) injection 4 Units  4 Units Subcutaneous TID WC Parks Ranger, DO   4 Units at 10/21/21 1159  ? linagliptin (TRADJENTA) tablet 5 mg  5 mg Oral Daily Parks Ranger, DO   5 mg at 10/21/21 1026  ? OLANZapine (ZYPREXA) tablet 10 mg  10 mg Oral QHS Parks Ranger, DO   10 mg at 10/20/21 2116  ? ondansetron (ZOFRAN) tablet 4 mg  4 mg Oral Q6H PRN Parks Ranger, DO      ? Or  ? ondansetron (ZOFRAN) injection 4 mg  4 mg Intravenous Q6H PRN Parks Ranger, DO      ? polyethylene glycol (MIRALAX / GLYCOLAX) packet 17 g  17 g Oral QPC breakfast Parks Ranger, DO   17 g at 10/21/21 1028  ? senna-docusate (Senokot-S) tablet 1 tablet  1 tablet Oral QHS Parks Ranger, DO      ? sorbitol, milk of mag, mineral oil, glycerin (SMOG) enema  960 mL Rectal Daily PRN Parks Ranger, DO   960 mL at 10/19/21 2202  ? traZODone (DESYREL) tablet 100 mg  100 mg Oral QHS Parks Ranger, DO   100 mg at 10/20/21 2116  ? ? ?Lab Results:  ?Results for orders placed or performed during the hospital encounter of 09/29/21 (from the past 48 hour(s))  ?CBC with Differential/Platelet     Status: Abnormal  ? Collection Time: 10/19/21  1:13 PM  ?Result Value Ref Range  ? WBC 12.2 (H) 4.0 - 10.5 K/uL  ? RBC 3.65 (L) 3.87 - 5.11 MIL/uL  ? Hemoglobin 9.7 (L) 12.0 - 15.0 g/dL  ? HCT 31.7 (L) 36.0 - 46.0 %  ? MCV 86.8 80.0 - 100.0 fL  ? MCH 26.6 26.0 - 34.0 pg  ? MCHC 30.6 30.0 - 36.0 g/dL  ? RDW 14.1 11.5 - 15.5 %  ? Platelets 411 (H) 150 - 400 K/uL  ? nRBC 0.0 0.0 - 0.2 %  ? Neutrophils Relative % 73 %  ? Neutro Abs 8.9 (H) 1.7 - 7.7 K/uL  ? Lymphocytes Relative 13 %  ?  Lymphs Abs 1.5 0.7 -  4.0 K/uL  ? Monocytes Relative 14 %  ? Monocytes Absolute 1.7 (H) 0.1 - 1.0 K/uL  ? Eosinophils Relative 0 %  ? Eosinophils Absolute 0.0 0.0 - 0.5 K/uL  ? Basophils Relative 0 %  ? Basophils Absolute 0.0 0.0 - 0.1 K/uL  ? Immature Granulocytes 0 %  ? Abs Immature Granulocytes 0.04 0.00 - 0.07 K/uL  ?  Comment: Performed at [facility], [address], [place], [place] [address]  ?Comprehensive metabolic panel     Status: Abnormal  ? Collection Time: 10/19/21  1:13 PM  ?Result Value Ref Range  ? Sodium 137 135 - 145 mmol/L  ? Potassium 4.2 3.5 - 5.1 mmol/L  ? Chloride 98 98 - 111 mmol/L  ? CO2 26 22 - 32 mmol/L  ? Glucose, Bld 192 (H) 70 - 99 mg/dL  ?  Comment: Glucose reference range applies only to samples taken after fasting for at least 8 hours.  ? BUN 78 (H) 8 - 23 mg/dL  ? Creatinine, Ser 2.59 (H) 0.44 - 1.00 mg/dL  ? Calcium 9.6 8.9 - 10.3 mg/dL  ? Total Protein 7.9 6.5 - 8.1 g/dL  ? Albumin 3.1 (L) 3.5 - 5.0 g/dL  ? AST 27 15 - 41 U/L  ? ALT 20 0 - 44 U/L  ? Alkaline Phosphatase 93 38 - 126 U/L  ? Total Bilirubin 0.3 0.3 - 1.2 mg/dL  ? GFR, Estimated 20 (L) >60 mL/min  ?  Comment: (NOTE) ?Calculated using the CKD-EPI Creatinine Equation (2021) ?  ? Anion gap 13 5 - 15  ?  Comment: Performed at [facility], [address] [place], [place] [address]  ?Lactic acid, plasma     Status: None  ? Collection Time: 10/19/21  1:13 PM  ?Result Value Ref Range  ? Lactic Acid, Venous 1.5 0.5 - 1.9 mmol/L  ?  Comment: Performed at [facility], [address] [place], [place] [address]  ?Glucose, capillary     Status: Abnormal  ? Collection Time: 10/19/21  4:45 PM  ?Result Value Ref Range  ? Glucose-Capillary 129 (H) 70 - 99 mg/dL  ?  Comment: Glucose reference range applies only to samples taken after fasting for at least 8 hours.  ?Glucose, capillary     Status: Abnormal  ? Collection Time: 10/19/21  7:31 PM  ?Result Value Ref Range  ? Glucose-Capillary 179 (H) 70 -  99 mg/dL  ?  Comment: Glucose reference range applies only to samples taken after fasting for at least 8 hours.  ?Glucose, capillary     Status: Abnormal  ? Collection Time: 10/20/21  8:16 AM  ?Result Value

## 2021-10-21 NOTE — BHH Group Notes (Signed)
[place] Group Notes:  (Nursing/MHT/Case Management/Adjunct) ? ?Date:  10/21/2021  ?Time:  10:15 AM ? ?Type of Therapy:  Psychoeducational Skills ? ?Participation Level:  Minimal ? ?Participation Quality:  Attentive ? ?Affect:  Appropriate ? ?Cognitive:  Appropriate ? ?Insight:  None ? ?Engagement in Group:  Improving ? ?Modes of Intervention:  Discussion ? ?Summary of Progress/Problems: ?Pt attended group and followed along with handout, however pt did not engage in discussion. ?Alicia Rogers ?10/21/2021, 11:59 AM ?

## 2021-10-21 NOTE — Progress Notes (Addendum)
Patient's continuous IV infusion 1L bag finished running. Dr. Louis Meckel gave verbal orders to d/c order for infusion and to take out peripheral IV line. Line was taken out. Patient 1:1 order was discontinued. ?

## 2021-10-22 DIAGNOSIS — F250 Schizoaffective disorder, bipolar type: Secondary | ICD-10-CM | POA: Diagnosis not present

## 2021-10-22 LAB — GLUCOSE, CAPILLARY
Glucose-Capillary: 194 mg/dL — ABNORMAL HIGH (ref 70–99)
Glucose-Capillary: 143 mg/dL — ABNORMAL HIGH (ref 70–99)
Glucose-Capillary: 142 mg/dL — ABNORMAL HIGH (ref 70–99)

## 2021-10-22 NOTE — Progress Notes (Signed)
Patient denies SI, HI, and AVH. Patient is calm and cooperative. She denies pain and said that she had a small BM this morning. She came to the dayroom to eat breakfast and used the phone. Patient remains safe on the unit at this time. ?

## 2021-10-22 NOTE — Progress Notes (Signed)
[facility] MD Progress Note ? ?10/22/2021 11:28 AM ?Alicia Rogers  ?MRN:  [id number] ?Subjective: Patient states that she is feeling better as far as constipation.  I encouraged her to drink more fluids as this contributed to her constipation and dehydration.  She states that she is doing okay.  Social work continues to work on placement.  She denies any side effects from her medications.  She denies any auditory hallucinations. ? ?Principal Problem: Schizoaffective disorder, bipolar type ([place]) ?Diagnosis: Principal Problem: ?  Schizoaffective disorder, bipolar type ([place]) ? ?Total Time spent with patient: 15 minutes ? ?Past Psychiatric History: [facility] ? ?Past Medical History:  ?Past Medical History:  ?Diagnosis Date  ? Depression   ? Diabetes mellitus without complication ([place])   ? Diabetes mellitus, type II ([place])   ? Hypertension   ? Schizophrenia ([place])   ? History reviewed. No pertinent surgical history. ?Family History:  ?Family History  ?Problem Relation Age of Onset  ? Diabetes Mother   ? Glaucoma Mother   ? Heart disease Mother   ? Heart attack Mother   ? Depression Mother   ? Hypertension Mother   ? Diabetes Father   ? Glaucoma Father   ? Hypertension Father   ? Diabetes Sister   ? Depression Sister   ? Post-traumatic stress disorder Sister   ? ?Social History:  ?Social History  ? ?Substance and Sexual Activity  ?Alcohol Use No  ? Alcohol/week: 0.0 standard drinks  ?   ?Social History  ? ?Substance and Sexual Activity  ?Drug Use No  ?  ?Social History  ? ?Socioeconomic History  ? Marital status: Single  ?  Spouse name: Not on file  ? Number of children: Not on file  ? Years of education: Not on file  ? Highest education level: Not on file  ?Occupational History  ? Not on file  ?Tobacco Use  ? Smoking status: Every Day  ?  Packs/day: 1.00  ?  Types: Cigarettes  ?  Start date: 04/21/1978  ? Smokeless tobacco: Never  ?Vaping Use  ? Vaping Use: Never used  ?Substance and Sexual Activity  ? Alcohol  use: No  ?  Alcohol/week: 0.0 standard drinks  ? Drug use: No  ? Sexual activity: Not Currently  ?Other Topics Concern  ? Not on file  ?Social History Narrative  ? Not on file  ? ?Social Determinants of Health  ? ?Financial Resource Strain: Not on file  ?Food Insecurity: Not on file  ?Transportation Needs: Not on file  ?Physical Activity: Not on file  ?Stress: Not on file  ?Social Connections: Not on file  ? ?Additional Social History:  ?  ?  ?  ?  ?  ?  ?  ?  ?  ?  ?  ? ?Sleep: Good ? ?Appetite:  Fair ? ?Current Medications: ?Current Facility-Administered Medications  ?Medication Dose Route Frequency Provider Last Rate Last Admin  ? acetaminophen (TYLENOL) tablet 650 mg  650 mg Oral Q6H PRN Caroline Sauger, NP   650 mg at 10/12/21 1031  ? alum & mag hydroxide-simeth (MAALOX/MYLANTA) 200-200-20 MG/5ML suspension 30 mL  30 mL Oral Q4H PRN Parks Ranger, DO   30 mL at 10/01/21 2215  ? amLODipine (NORVASC) tablet 10 mg  10 mg Oral Daily Parks Ranger, DO   10 mg at 10/22/21 7829  ? cyclobenzaprine (FLEXERIL) tablet 10 mg  10 mg Oral QHS Parks Ranger, DO   10 mg at 10/21/21  2244  ? escitalopram (LEXAPRO) tablet 10 mg  10 mg Oral Daily Parks Ranger, DO   10 mg at 10/22/21 4010  ? feeding supplement (GLUCERNA SHAKE) (GLUCERNA SHAKE) liquid 237 mL  237 mL Oral TID BM Clapacs, Madie Reno, MD   237 mL at 10/22/21 0908  ? Glycerin (Adult) 2 g suppository 1 suppository  1 suppository Rectal Daily PRN Parks Ranger, DO   1 suppository at 10/19/21 1538  ? hydrALAZINE (APRESOLINE) tablet 50 mg  50 mg Oral TID Parks Ranger, DO   50 mg at 10/22/21 0831  ? hydrochlorothiazide (HYDRODIURIL) tablet 25 mg  25 mg Oral Daily Parks Ranger, DO   25 mg at 10/22/21 2725  ? insulin aspart (novoLOG) injection 4 Units  4 Units Subcutaneous TID WC Parks Ranger, DO   4 Units at 10/22/21 0813  ? linagliptin (TRADJENTA) tablet 5 mg  5 mg Oral Daily Parks Ranger, DO   5 mg at 10/22/21 3664  ? OLANZapine (ZYPREXA) tablet 10 mg  10 mg Oral QHS Parks Ranger, DO   10 mg at 10/21/21 2245  ? ondansetron (ZOFRAN) tablet 4 mg  4 mg Oral Q6H PRN Parks Ranger, DO      ? Or  ? ondansetron (ZOFRAN) injection 4 mg  4 mg Intravenous Q6H PRN Parks Ranger, DO      ? polyethylene glycol (MIRALAX / GLYCOLAX) packet 17 g  17 g Oral QPC breakfast Parks Ranger, DO   17 g at 10/22/21 0831  ? senna-docusate (Senokot-S) tablet 1 tablet  1 tablet Oral QHS Parks Ranger, DO   1 tablet at 10/21/21 2245  ? sorbitol, milk of mag, mineral oil, glycerin (SMOG) enema  960 mL Rectal Daily PRN Parks Ranger, DO   960 mL at 10/19/21 2202  ? traZODone (DESYREL) tablet 100 mg  100 mg Oral QHS Parks Ranger, DO   100 mg at 10/21/21 2245  ? ? ?Lab Results:  ?Results for orders placed or performed during the hospital encounter of 09/29/21 (from the past 48 hour(s))  ?Glucose, capillary     Status: Abnormal  ? Collection Time: 10/20/21 12:08 PM  ?Result Value Ref Range  ? Glucose-Capillary 231 (H) 70 - 99 mg/dL  ?  Comment: Glucose reference range applies only to samples taken after fasting for at least 8 hours.  ?Glucose, capillary     Status: Abnormal  ? Collection Time: 10/20/21  4:54 PM  ?Result Value Ref Range  ? Glucose-Capillary 148 (H) 70 - 99 mg/dL  ?  Comment: Glucose reference range applies only to samples taken after fasting for at least 8 hours.  ?Comprehensive metabolic panel     Status: Abnormal  ? Collection Time: 10/21/21  6:05 AM  ?Result Value Ref Range  ? Sodium 133 (L) 135 - 145 mmol/L  ? Potassium 3.6 3.5 - 5.1 mmol/L  ? Chloride 99 98 - 111 mmol/L  ? CO2 28 22 - 32 mmol/L  ? Glucose, Bld 155 (H) 70 - 99 mg/dL  ?  Comment: Glucose reference range applies only to samples taken after fasting for at least 8 hours.  ? BUN 55 (H) 8 - 23 mg/dL  ? Creatinine, Ser 2.17 (H) 0.44 - 1.00 mg/dL  ? Calcium 8.7 (L) 8.9  - 10.3 mg/dL  ? Total Protein 6.9 6.5 - 8.1 g/dL  ? Albumin 2.6 (L) 3.5 - 5.0 g/dL  ? AST 18  15 - 41 U/L  ? ALT 15 0 - 44 U/L  ? Alkaline Phosphatase 87 38 - 126 U/L  ? Total Bilirubin 0.5 0.3 - 1.2 mg/dL  ? GFR, Estimated 25 (L) >60 mL/min  ?  Comment: (NOTE) ?Calculated using the CKD-EPI Creatinine Equation (2021) ?  ? Anion gap 6 5 - 15  ?  Comment: Performed at [facility], [address] [place], [place] [address]  ?Glucose, capillary     Status: Abnormal  ? Collection Time: 10/21/21  7:55 AM  ?Result Value Ref Range  ? Glucose-Capillary 141 (H) 70 - 99 mg/dL  ?  Comment: Glucose reference range applies only to samples taken after fasting for at least 8 hours.  ?Glucose, capillary     Status: Abnormal  ? Collection Time: 10/21/21 11:41 AM  ?Result Value Ref Range  ? Glucose-Capillary 215 (H) 70 - 99 mg/dL  ?  Comment: Glucose reference range applies only to samples taken after fasting for at least 8 hours.  ?Glucose, capillary     Status: Abnormal  ? Collection Time: 10/21/21  4:19 PM  ?Result Value Ref Range  ? Glucose-Capillary 180 (H) 70 - 99 mg/dL  ?  Comment: Glucose reference range applies only to samples taken after fasting for at least 8 hours.  ?Glucose, capillary     Status: Abnormal  ? Collection Time: 10/21/21 10:14 PM  ?Result Value Ref Range  ? Glucose-Capillary 234 (H) 70 - 99 mg/dL  ?  Comment: Glucose reference range applies only to samples taken after fasting for at least 8 hours.  ? Comment 1 Notify RN   ?Glucose, capillary     Status: Abnormal  ? Collection Time: 10/22/21  7:53 AM  ?Result Value Ref Range  ? Glucose-Capillary 194 (H) 70 - 99 mg/dL  ?  Comment: Glucose reference range applies only to samples taken after fasting for at least 8 hours.  ? ? ?Blood Alcohol level:  ?Lab Results  ?Component Value Date  ? ETH <10 09/20/2021  ? ETH <10 10/13/2020  ? ? ?Metabolic Disorder Labs: ?Lab Results  ?Component Value Date  ? HGBA1C 8.0 (H) 09/27/2021  ? MPG 182.9 09/27/2021  ? MPG  180.03 10/13/2020  ? ?No results found for: PROLACTIN ?Lab Results  ?Component Value Date  ? CHOL 267 (H) 10/13/2020  ? TRIG 180 (H) 10/13/2020  ? HDL 86 10/13/2020  ? CHOLHDL 3.1 10/13/2020  ? VLDL 36 03/

## 2021-10-22 NOTE — BHH Counselor (Signed)
CSW faxed additional requested document referrals to Marena Chancy, admissions at [facility]. She stated that she would look at information and reach back out to CSW regarding any admission updates.  ? ?Koty Anctil [place], MSW, LCSW-A ?3/23/20231:56 PM  ?

## 2021-10-22 NOTE — Progress Notes (Incomplete)
Pt lying in bed with eyes open; calm, cooperative. Pt states that she feels "okay". She denies pain, SI/HI/AVH, anxiety and depression at this time. She describes her sleep as "okay" and her appetite as "good". No acute distress noted. ?

## 2021-10-22 NOTE — BHH Counselor (Addendum)
CSW contacted Robet Leu, [phone], at [facility] case worker to get update on pt's status regarding placement to facility.  ? ?CSW provided updates on a few pending placements to [place] and [place] and no answer from [facility].  ? ?CSW left voicemail regarding information as there was no answer. CSW will follow up at another time.  ? ?Shagun Wordell [place], MSW, LCSW-A ?3/23/20232:03 PM  ?

## 2021-10-22 NOTE — Progress Notes (Signed)
Recreation Therapy Notes ? ?Date: 10/22/2021 ?  ?Time: 1:40 pm  ?  ?Location: Court yard  ?  ?Behavioral response: N/A ?  ?Intervention Topic: Leisure  ? ?Discussion/Intervention: ?Patient refused to attend group. ?  ?Clinical Observations/Feedback: ?Patient refused to attend group. ?  ?Korah Hufstedler LRT/CTRS ? ? ? ? ? ? ? ?Jorden Mahl ?10/22/2021 2:55 PM ?

## 2021-10-22 NOTE — BHH Counselor (Addendum)
CSW reached out to [place] at [facility], [phone] and was not able to leave a voice mail as there was no answer.  ? ?CSW will follow up at another time regarding openings and fax referral that was sent.  ? ?Alicia Rogers, MSW, LCSW-A ?3/23/20231:58 PM  ?

## 2021-10-22 NOTE — Progress Notes (Signed)
Pt sitting in recliner in hallway; calm, cooperative. Pt urinated on self. She states "My hand hurt when I move it"; however, she currently denies pain. She also denies SI/HI/AVH, anxiety and depression at this time. She states "Sometimes I sleep and sometimes I don't" when asked how she slept last night. She describes her appetites as "good". Pt taken to room, given bed bath and scrubs changed. Pt left lying in bed with eyes closed; no acute distress noted. ?

## 2021-10-23 DIAGNOSIS — F250 Schizoaffective disorder, bipolar type: Secondary | ICD-10-CM | POA: Diagnosis not present

## 2021-10-23 LAB — GLUCOSE, CAPILLARY
Glucose-Capillary: 151 mg/dL — ABNORMAL HIGH (ref 70–99)
Glucose-Capillary: 140 mg/dL — ABNORMAL HIGH (ref 70–99)
Glucose-Capillary: 285 mg/dL — ABNORMAL HIGH (ref 70–99)
Glucose-Capillary: 113 mg/dL — ABNORMAL HIGH (ref 70–99)

## 2021-10-23 MED ORDER — DILTIAZEM HCL ER COATED BEADS 240 MG PO CP24
240.0000 mg | ORAL_CAPSULE | Freq: Every day | ORAL | Status: DC
  Administered 2021-10-23 – 2021-11-02 (×11): 240 mg via ORAL
  Filled 2021-10-23 (×11): qty 1

## 2021-10-23 NOTE — Group Note (Signed)
[place] LCSW Group Therapy Note ? ? ?Group Date: 10/23/2021 ?Start Time: 1415 ?End Time: 1430 ? ? ?Type of Therapy/Topic:  Group Therapy:  Balance in Life ? ?Participation Level:  Did Not Attend  ? ?Description of Group:   ? This group will address the concept of balance and how it feels and looks when one is unbalanced. Patients will be encouraged to process areas in their lives that are out of balance, and identify reasons for remaining unbalanced. Facilitators will guide patients utilizing problem- solving interventions to address and correct the stressor making their life unbalanced. Understanding and applying boundaries will be explored and addressed for obtaining  and maintaining a balanced life. Patients will be encouraged to explore ways to assertively make their unbalanced needs known to significant others in their lives, using other group members and facilitator for support and feedback. ? ?Therapeutic Goals: ?Patient will identify two or more emotions or situations they have that consume much of in their lives. ?Patient will identify signs/triggers that life has become out of balance:  ?Patient will identify two ways to set boundaries in order to achieve balance in their lives:  ?Patient will demonstrate ability to communicate their needs through discussion and/or role plays ? ?Summary of Patient Progress: ? ?X ? ? ? ?Therapeutic Modalities:   ?Cognitive Behavioral Therapy ?Solution-Focused Therapy ?Assertiveness Training ? ? ?Leelynd Maldonado A [place], LCSWA ?

## 2021-10-23 NOTE — Progress Notes (Signed)
Recreation Therapy Notes ? ? ?Date: 10/23/2021 ? ?Time: 1:40pm  ? ?Location: Courtyard      ? ?Behavioral response: N/A ?  ?Intervention Topic: Social Skills   ? ?Discussion/Intervention: ?Patient refused to attend group.  ? ?Clinical Observations/Feedback:  ?Patient refused to attend group.  ?  ?Zipporah Finamore LRT/CTRS ? ? ? ? ? ? ? ?Lalena Salas ?10/23/2021 2:47 PM ? ? ? ? ? ? ? ?Takeru Bose ?10/23/2021 2:47 PM ?

## 2021-10-23 NOTE — Plan of Care (Signed)
Patient remain alert and oriented, calm and cooperative. Patient denies pain or discomfort at this time. Report feeling better this morning. Denies depression and anxiety. Denies SI, HI, AVH. Compliant with due medications. Ate breakfast in the day room among peers. Remain safe on the unit with Q15 minute safety checks.  ? ?Problem: Education: ?Goal: Knowledge of [facility] information/materials will improve ?Outcome: Progressing ?Goal: Emotional status will improve ?Outcome: Progressing ?Goal: Mental status will improve ?Outcome: Progressing ?Goal: Verbalization of understanding the information provided will improve ?Outcome: Progressing ?  ?Problem: Activity: ?Goal: Interest or engagement in activities will improve ?Outcome: Progressing ?Goal: Sleeping patterns will improve ?Outcome: Progressing ?  ?Problem: Coping: ?Goal: Ability to verbalize frustrations and anger appropriately will improve ?Outcome: Progressing ?Goal: Ability to demonstrate self-control will improve ?Outcome: Progressing ?  ?Problem: Health Behavior/Discharge Planning: ?Goal: Identification of resources available to assist in meeting health care needs will improve ?Outcome: Progressing ?Goal: Compliance with treatment plan for underlying cause of condition will improve ?Outcome: Progressing ?  ?Problem: Physical Regulation: ?Goal: Ability to maintain clinical measurements within normal limits will improve ?Outcome: Progressing ?  ?Problem: Safety: ?Goal: Periods of time without injury will increase ?Outcome: Progressing ?  ?

## 2021-10-23 NOTE — Progress Notes (Signed)
?   10/23/21 1217 10/23/21 1444 10/23/21 1553  ?Vital Signs  ?Temp Source  --   --   --   ?Pulse Rate 76 79 73  ?Pulse Rate Source  --   --  Monitor  ?Resp  --   --  18  ?BP (!) 142/56 126/60 (!) 128/51  ?BP Location  --   --  Right Arm  ?BP Method Automatic Automatic Automatic  ?Patient Position (if appropriate)  --   --  Lying  ?Level of Consciousness  ?Level of Consciousness  --   --   --   ?MEWS COLOR  ?MEWS Score Color Rica Mast  ?Oxygen Therapy  ?SpO2  --   --  100 %  ? ? 10/23/21 1945 10/23/21 2009  ?Vital Signs  ?Temp Source  --  Oral  ?Pulse Rate  --  70  ?Pulse Rate Source  --  Monitor  ?Resp  --  18  ?BP  --  (!) 138/57  ?BP Location  --  Right Arm  ?BP Method  --  Automatic  ?Patient Position (if appropriate)  --  Lying  ?Level of Consciousness  ?Level of Consciousness Alert  --   ?MEWS COLOR  ?MEWS Score Color Ailene Ards  ?Oxygen Therapy  ?SpO2  --  100 %  ? ?Patient compliant with medications denies SI/HI/A/VH and verbally contracted for safety. Support and encouragement provided. HS Apresoline mg held for v/s. Support and encouragement provided.  ?

## 2021-10-23 NOTE — Progress Notes (Signed)
[facility] MD Progress Note ? ?10/23/2021 12:03 PM ?[place]  ?MRN:  [id number] ?Subjective:  Alicia Rogers is in a good mood today.  She is doing a crossword puzzle.  Her blood pressure continues to be high, change her medications and take her off of the diuretic and Norvasc and start Cardizem.  Social work is continuing to look for placement.  Her blood sugar has been running in the 140s which is pretty good for her. ? ?Principal Problem: Schizoaffective disorder, bipolar type ([place]) ?Diagnosis: Principal Problem: ?  Schizoaffective disorder, bipolar type ([place]) ? ?Total Time spent with patient: 15 minutes ? ?Past Psychiatric History: [place] [facility] ? ?Past Medical History:  ?Past Medical History:  ?Diagnosis Date  ? Depression   ? Diabetes mellitus without complication ([place])   ? Diabetes mellitus, type II ([place])   ? Hypertension   ? Schizophrenia ([place])   ? History reviewed. No pertinent surgical history. ?Family History:  ?Family History  ?Problem Relation Age of Onset  ? Diabetes Mother   ? Glaucoma Mother   ? Heart disease Mother   ? Heart attack Mother   ? Depression Mother   ? Hypertension Mother   ? Diabetes Father   ? Glaucoma Father   ? Hypertension Father   ? Diabetes Sister   ? Depression Sister   ? Post-traumatic stress disorder Sister   ? ? ?Social History:  ?Social History  ? ?Substance and Sexual Activity  ?Alcohol Use No  ? Alcohol/week: 0.0 standard drinks  ?   ?Social History  ? ?Substance and Sexual Activity  ?Drug Use No  ?  ?Social History  ? ?Socioeconomic History  ? Marital status: Single  ?  Spouse name: Not on file  ? Number of children: Not on file  ? Years of education: Not on file  ? Highest education level: Not on file  ?Occupational History  ? Not on file  ?Tobacco Use  ? Smoking status: Every Day  ?  Packs/day: 1.00  ?  Types: Cigarettes  ?  Start date: 04/21/1978  ? Smokeless tobacco: Never  ?Vaping Use  ? Vaping Use: Never used  ?Substance and Sexual Activity  ? Alcohol use: No   ?  Alcohol/week: 0.0 standard drinks  ? Drug use: No  ? Sexual activity: Not Currently  ?Other Topics Concern  ? Not on file  ?Social History Narrative  ? Not on file  ? ?Social Determinants of Health  ? ?Financial Resource Strain: Not on file  ?Food Insecurity: Not on file  ?Transportation Needs: Not on file  ?Physical Activity: Not on file  ?Stress: Not on file  ?Social Connections: Not on file  ? ?Additional Social History:  ?  ?  ?  ?  ?  ?  ?  ?  ?  ?  ?  ? ?Sleep: Good ? ?Appetite:  Fair ? ?Current Medications: ?Current Facility-Administered Medications  ?Medication Dose Route Frequency Provider Last Rate Last Admin  ? acetaminophen (TYLENOL) tablet 650 mg  650 mg Oral Q6H PRN Caroline Sauger, NP   650 mg at 10/12/21 1031  ? alum & mag hydroxide-simeth (MAALOX/MYLANTA) 200-200-20 MG/5ML suspension 30 mL  30 mL Oral Q4H PRN Parks Ranger, DO   30 mL at 10/01/21 2215  ? amLODipine (NORVASC) tablet 10 mg  10 mg Oral Daily Parks Ranger, DO   10 mg at 10/23/21 1014  ? cyclobenzaprine (FLEXERIL) tablet 10 mg  10 mg Oral QHS Parks Ranger, DO  10 mg at 10/22/21 2207  ? escitalopram (LEXAPRO) tablet 10 mg  10 mg Oral Daily Parks Ranger, DO   10 mg at 10/23/21 1014  ? feeding supplement (GLUCERNA SHAKE) (GLUCERNA SHAKE) liquid 237 mL  237 mL Oral TID BM Clapacs, John T, MD   237 mL at 10/23/21 1025  ? Glycerin (Adult) 2 g suppository 1 suppository  1 suppository Rectal Daily PRN Parks Ranger, DO   1 suppository at 10/19/21 1538  ? hydrALAZINE (APRESOLINE) tablet 50 mg  50 mg Oral TID Parks Ranger, DO   50 mg at 10/23/21 6967  ? hydrochlorothiazide (HYDRODIURIL) tablet 25 mg  25 mg Oral Daily Parks Ranger, DO   25 mg at 10/23/21 1014  ? insulin aspart (novoLOG) injection 4 Units  4 Units Subcutaneous TID WC Parks Ranger, DO   4 Units at 10/23/21 8938  ? linagliptin (TRADJENTA) tablet 5 mg  5 mg Oral Daily Parks Ranger, DO   5 mg at 10/23/21 1014  ? OLANZapine (ZYPREXA) tablet 10 mg  10 mg Oral QHS Parks Ranger, DO   10 mg at 10/22/21 2207  ? ondansetron (ZOFRAN) tablet 4 mg  4 mg Oral Q6H PRN Parks Ranger, DO      ? Or  ? ondansetron (ZOFRAN) injection 4 mg  4 mg Intravenous Q6H PRN Parks Ranger, DO      ? polyethylene glycol (MIRALAX / GLYCOLAX) packet 17 g  17 g Oral QPC breakfast Parks Ranger, DO   17 g at 10/23/21 1017  ? senna-docusate (Senokot-S) tablet 1 tablet  1 tablet Oral QHS Parks Ranger, DO   1 tablet at 10/22/21 2207  ? sorbitol, milk of mag, mineral oil, glycerin (SMOG) enema  960 mL Rectal Daily PRN Parks Ranger, DO   960 mL at 10/19/21 2202  ? traZODone (DESYREL) tablet 100 mg  100 mg Oral QHS Parks Ranger, DO   100 mg at 10/22/21 2207  ? ? ?Lab Results:  ?Results for orders placed or performed during the hospital encounter of 09/29/21 (from the past 48 hour(s))  ?Glucose, capillary     Status: Abnormal  ? Collection Time: 10/21/21  4:19 PM  ?Result Value Ref Range  ? Glucose-Capillary 180 (H) 70 - 99 mg/dL  ?  Comment: Glucose reference range applies only to samples taken after fasting for at least 8 hours.  ?Glucose, capillary     Status: Abnormal  ? Collection Time: 10/21/21 10:14 PM  ?Result Value Ref Range  ? Glucose-Capillary 234 (H) 70 - 99 mg/dL  ?  Comment: Glucose reference range applies only to samples taken after fasting for at least 8 hours.  ? Comment 1 Notify RN   ?Glucose, capillary     Status: Abnormal  ? Collection Time: 10/22/21  7:53 AM  ?Result Value Ref Range  ? Glucose-Capillary 194 (H) 70 - 99 mg/dL  ?  Comment: Glucose reference range applies only to samples taken after fasting for at least 8 hours.  ?Glucose, capillary     Status: Abnormal  ? Collection Time: 10/22/21 11:32 AM  ?Result Value Ref Range  ? Glucose-Capillary 143 (H) 70 - 99 mg/dL  ?  Comment: Glucose reference range applies only to samples  taken after fasting for at least 8 hours.  ?Glucose, capillary     Status: Abnormal  ? Collection Time: 10/22/21  4:58 PM  ?Result Value Ref Range  ? Glucose-Capillary  142 (H) 70 - 99 mg/dL  ?  Comment: Glucose reference range applies only to samples taken after fasting for at least 8 hours.  ?Glucose, capillary     Status: Abnormal  ? Collection Time: 10/23/21  7:44 AM  ?Result Value Ref Range  ? Glucose-Capillary 151 (H) 70 - 99 mg/dL  ?  Comment: Glucose reference range applies only to samples taken after fasting for at least 8 hours.  ?Glucose, capillary     Status: Abnormal  ? Collection Time: 10/23/21 11:40 AM  ?Result Value Ref Range  ? Glucose-Capillary 140 (H) 70 - 99 mg/dL  ?  Comment: Glucose reference range applies only to samples taken after fasting for at least 8 hours.  ? ? ?Blood Alcohol level:  ?Lab Results  ?Component Value Date  ? ETH <10 09/20/2021  ? ETH <10 10/13/2020  ? ? ?Metabolic Disorder Labs: ?Lab Results  ?Component Value Date  ? HGBA1C 8.0 (H) 09/27/2021  ? MPG 182.9 09/27/2021  ? MPG 180.03 10/13/2020  ? ?No results found for: PROLACTIN ?Lab Results  ?Component Value Date  ? CHOL 267 (H) 10/13/2020  ? TRIG 180 (H) 10/13/2020  ? HDL 86 10/13/2020  ? CHOLHDL 3.1 10/13/2020  ? VLDL 36 10/13/2020  ? LDLCALC 145 (H) 10/13/2020  ? LDLCALC 158 (H) 02/29/2020  ? ? ?Physical Findings: ?AIMS:  , ,  ,  ,    ?CIWA:    ?COWS:    ? ?Musculoskeletal: ?Strength & Muscle Tone: within normal limits ?Gait & Station: normal ?Patient leans: N/A ? ?Psychiatric Specialty Exam: ? ?Presentation  ?General Appearance: Bizarre; Disheveled ? ?Eye Contact:Fair ? ?Speech:Garbled ? ?Speech Volume:Decreased ? ?Handedness:Right ? ? ?Mood and Affect  ?Mood:No data recorded ?Affect:Flat ? ? ?Thought Process  ?Thought Processes:Disorganized ? ?Descriptions of Associations:Loose ? ?Orientation:Partial ? ?Thought Content:Illogical ? ?History of Schizophrenia/Schizoaffective disorder:Yes ? ?Duration of Psychotic  Symptoms:Greater than six months ? ?Hallucinations:No data recorded ?Ideas of Reference:No data recorded ?Suicidal Thoughts:No data recorded ?Homicidal Thoughts:No data recorded ? ?Sensorium  ?Memory:Immediate Poor ? ?

## 2021-10-23 NOTE — BHH Group Notes (Signed)
[facility] Group Notes:  (Nursing/MHT/Case Management/Adjunct) ? ?Date:  10/23/2021  ?Time:  10:00 AM ? ?Type of Therapy:  Psychoeducational Skills ? ?Participation Level:  Minimal ? ?Participation Quality:  Appropriate ? ?Affect:  Appropriate ? ?Cognitive:  Appropriate ? ?Insight:  Appropriate ? ?Engagement in Group:  Engaged ? ?Modes of Intervention:  Education ? ?Summary of Progress/Problems: ?The pt participated in group today. The pt was engaged and followed along with handout. ?Alicia Rogers ?10/23/2021, 12:04 PM ?

## 2021-10-23 NOTE — Plan of Care (Signed)
°  Problem: Group Participation °Goal: STG - Patient will engage in groups without prompting or encouragement from LRT x3 group sessions within 5 recreation therapy group sessions °Description: STG - Patient will engage in groups without prompting or encouragement from LRT x3 group sessions within 5 recreation therapy group sessions °Outcome: Progressing °  °

## 2021-10-24 DIAGNOSIS — F250 Schizoaffective disorder, bipolar type: Secondary | ICD-10-CM | POA: Diagnosis not present

## 2021-10-24 LAB — GLUCOSE, CAPILLARY
Glucose-Capillary: 149 mg/dL — ABNORMAL HIGH (ref 70–99)
Glucose-Capillary: 186 mg/dL — ABNORMAL HIGH (ref 70–99)
Glucose-Capillary: 150 mg/dL — ABNORMAL HIGH (ref 70–99)
Glucose-Capillary: 153 mg/dL — ABNORMAL HIGH (ref 70–99)

## 2021-10-24 NOTE — Plan of Care (Signed)
Patient remain alert and oriented slow to respond. Calm and cooperative during assessment. Denies pain and discomfort at this time. Denies anxiety, depression, SI, HI, and AVH. Report having a bowel movement this morning. Ate breakfast in the day room among peers and tolerated well. Compliant with all due medications. Remain safe on the unit with Q15 minute safety checks. ? ?Problem: Education: ?Goal: Knowledge of [facility] Education information/materials will improve ?Outcome: Progressing ?Goal: Emotional status will improve ?Outcome: Progressing ?Goal: Mental status will improve ?Outcome: Progressing ?Goal: Verbalization of understanding the information provided will improve ?Outcome: Progressing ?  ?Problem: Activity: ?Goal: Interest or engagement in activities will improve ?Outcome: Progressing ?Goal: Sleeping patterns will improve ?Outcome: Progressing ?  ?Problem: Coping: ?Goal: Ability to verbalize frustrations and anger appropriately will improve ?Outcome: Progressing ?Goal: Ability to demonstrate self-control will improve ?Outcome: Progressing ?  ?Problem: Health Behavior/Discharge Planning: ?Goal: Identification of resources available to assist in meeting health care needs will improve ?Outcome: Progressing ?Goal: Compliance with treatment plan for underlying cause of condition will improve ?Outcome: Progressing ?  ?Problem: Physical Regulation: ?Goal: Ability to maintain clinical measurements within normal limits will improve ?Outcome: Progressing ?  ?Problem: Safety: ?Goal: Periods of time without injury will increase ?Outcome: Progressing ?  ?

## 2021-10-24 NOTE — Group Note (Signed)
[facility] LCSW Group Therapy Note ? ? ?Group Date: 10/24/2021 ?Start Time: 1300 ?End Time: 1400 ? ?Type of Therapy and Topic:  Group Therapy:  Feelings around Relapse and Recovery ? ?Participation Level:  Did Not Attend  ? ?Mood: ? ?Description of Group:   ? Patients in this group will discuss emotions they experience before and after a relapse. They will process how experiencing these feelings, or avoidance of experiencing them, relates to having a relapse. Facilitator will guide patients to explore emotions they have related to recovery. Patients will be encouraged to process which emotions are more powerful. They will be guided to discuss the emotional reaction significant others in their lives may have to patients? relapse or recovery. Patients will be assisted in exploring ways to respond to the emotions of others without this contributing to a relapse. ? ?Therapeutic Goals: ?Patient will identify two or more emotions that lead to relapse for them:  ?Patient will identify two emotions that result when they relapse:  ?Patient will identify two emotions related to recovery:  ?Patient will demonstrate ability to communicate their needs through discussion and/or role plays. ? ? ?Summary of Patient Progress: Due to limited staffing, group was not held on the unit.  ? ? ?Therapeutic Modalities:   ?Cognitive Behavioral Therapy ?Solution-Focused Therapy ?Assertiveness Training ?Relapse Prevention Therapy ? ? ?Kenna Gilbert [place], LCSWA ?

## 2021-10-24 NOTE — Progress Notes (Signed)
[facility] MD Progress Note ? ?10/24/2021 10:23 AM ?Alicia Rogers  ?MRN:  [id number] ?Principal Problem: Schizoaffective disorder, bipolar type ([place]) ?Diagnosis: Principal Problem: ?  Schizoaffective disorder, bipolar type ([place]) ? ?Patient is a  y.o. 61yo female who presents to the [facility] unit due to non-compliance with psych medications and not taking care of self. ?  ?  ?Interval History ?Patient was seen today for re-evaluation.  Nursing reports no events overnight. ?  ?Subjective:  On assessment patient reports feeling good and does not express any complaints or concerns. She denies feeling depressed, anxious, suicidal, homicidal. Denies any hallucinations and does not express delusions. Her BP had improved after last BP medication adjustment. She is waiting for the appropriate placement. ? ? ?Total Time spent with patient: 20 minutes ? ?Past Psychiatric History:  ? ?Past Medical History:  ?Past Medical History:  ?Diagnosis Date  ? Depression   ? Diabetes mellitus without complication ([place])   ? Diabetes mellitus, type II ([place])   ? Hypertension   ? Schizophrenia ([place])   ? History reviewed. No pertinent surgical history. ?Family History:  ?Family History  ?Problem Relation Age of Onset  ? Diabetes Mother   ? Glaucoma Mother   ? Heart disease Mother   ? Heart attack Mother   ? Depression Mother   ? Hypertension Mother   ? Diabetes Father   ? Glaucoma Father   ? Hypertension Father   ? Diabetes Sister   ? Depression Sister   ? Post-traumatic stress disorder Sister   ? ?Family Psychiatric  History:  ?Social History:  ?Social History  ? ?Substance and Sexual Activity  ?Alcohol Use No  ? Alcohol/week: 0.0 standard drinks  ?   ?Social History  ? ?Substance and Sexual Activity  ?Drug Use No  ?  ?Social History  ? ?Socioeconomic History  ? Marital status: Single  ?  Spouse name: Not on file  ? Number of children: Not on file  ? Years of education: Not on file  ? Highest education level: Not on file  ?Occupational History  ? Not  on file  ?Tobacco Use  ? Smoking status: Every Day  ?  Packs/day: 1.00  ?  Types: Cigarettes  ?  Start date: 04/21/1978  ? Smokeless tobacco: Never  ?Vaping Use  ? Vaping Use: Never used  ?Substance and Sexual Activity  ? Alcohol use: No  ?  Alcohol/week: 0.0 standard drinks  ? Drug use: No  ? Sexual activity: Not Currently  ?Other Topics Concern  ? Not on file  ?Social History Narrative  ? Not on file  ? ?Social Determinants of Health  ? ?Financial Resource Strain: Not on file  ?Food Insecurity: Not on file  ?Transportation Needs: Not on file  ?Physical Activity: Not on file  ?Stress: Not on file  ?Social Connections: Not on file  ? ?Additional Social History:  ?  ?  ?  ?  ?  ?  ?  ?  ?  ?  ?  ? ?Sleep: Fair ? ?Appetite:  Fair ? ?Current Medications: ?Current Facility-Administered Medications  ?Medication Dose Route Frequency Provider Last Rate Last Admin  ? acetaminophen (TYLENOL) tablet 650 mg  650 mg Oral Q6H PRN Caroline Sauger, NP   650 mg at 10/12/21 1031  ? alum & mag hydroxide-simeth (MAALOX/MYLANTA) 200-200-20 MG/5ML suspension 30 mL  30 mL Oral Q4H PRN Parks Ranger, DO   30 mL at 10/01/21 2215  ? cyclobenzaprine (FLEXERIL) tablet 10 mg  10 mg Oral QHS Parks Ranger, DO   10 mg at 10/23/21 2143  ? diltiazem (CARDIZEM CD) 24 hr capsule 240 mg  240 mg Oral Daily Parks Ranger, DO   240 mg at 10/24/21 6384  ? escitalopram (LEXAPRO) tablet 10 mg  10 mg Oral Daily Parks Ranger, DO   10 mg at 10/24/21 6659  ? feeding supplement (GLUCERNA SHAKE) (GLUCERNA SHAKE) liquid 237 mL  237 mL Oral TID BM Clapacs, Madie Ren[place], MD   237 mL at 10/24/21 0945  ? Glycerin (Adult) 2 g suppository 1 suppository  1 suppository Rectal Daily PRN Parks Ranger, DO   1 suppository at 10/19/21 1538  ? hydrALAZINE (APRESOLINE) tablet 50 mg  50 mg Oral TID Parks Ranger, DO   50 mg at 10/24/21 9357  ? insulin aspart (novoLOG) injection 4 Units  4 Units Subcutaneous TID WC  Parks Ranger, DO   4 Units at 10/24/21 0177  ? linagliptin (TRADJENTA) tablet 5 mg  5 mg Oral Daily Parks Ranger, DO   5 mg at 10/24/21 9390  ? OLANZapine (ZYPREXA) tablet 10 mg  10 mg Oral QHS Parks Ranger, DO   10 mg at 10/23/21 2143  ? ondansetron (ZOFRAN) tablet 4 mg  4 mg Oral Q6H PRN Parks Ranger, DO      ? Or  ? ondansetron (ZOFRAN) injection 4 mg  4 mg Intravenous Q6H PRN Parks Ranger, DO      ? polyethylene glycol (MIRALAX / GLYCOLAX) packet 17 g  17 g Oral QPC breakfast Parks Ranger, DO   17 g at 10/24/21 3009  ? senna-docusate (Senokot-S) tablet 1 tablet  1 tablet Oral QHS Parks Ranger, DO   1 tablet at 10/23/21 2143  ? sorbitol, milk of mag, mineral oil, glycerin (SMOG) enema  960 mL Rectal Daily PRN Parks Ranger, DO   960 mL at 10/19/21 2202  ? traZODone (DESYREL) tablet 100 mg  100 mg Oral QHS Parks Ranger, DO   100 mg at 10/23/21 2143  ? ? ?Lab Results:  ?Results for orders placed or performed during the hospital encounter of 09/29/21 (from the past 48 hour(s))  ?Glucose, capillary     Status: Abnormal  ? Collection Time: 10/22/21 11:32 AM  ?Result Value Ref Range  ? Glucose-Capillary 143 (H) 70 - 99 mg/dL  ?  Comment: Glucose reference range applies only to samples taken after fasting for at least 8 hours.  ?Glucose, capillary     Status: Abnormal  ? Collection Time: 10/22/21  4:58 PM  ?Result Value Ref Range  ? Glucose-Capillary 142 (H) 70 - 99 mg/dL  ?  Comment: Glucose reference range applies only to samples taken after fasting for at least 8 hours.  ?Glucose, capillary     Status: Abnormal  ? Collection Time: 10/23/21  7:44 AM  ?Result Value Ref Range  ? Glucose-Capillary 151 (H) 70 - 99 mg/dL  ?  Comment: Glucose reference range applies only to samples taken after fasting for at least 8 hours.  ?Glucose, capillary     Status: Abnormal  ? Collection Time: 10/23/21 11:40 AM  ?Result Value Ref Range   ? Glucose-Capillary 140 (H) 70 - 99 mg/dL  ?  Comment: Glucose reference range applies only to samples taken after fasting for at least 8 hours.  ?Glucose, capillary     Status: Abnormal  ? Collection Time: 10/23/21  4:31 PM  ?Result  Value Ref Range  ? Glucose-Capillary 285 (H) 70 - 99 mg/dL  ?  Comment: Glucose reference range applies only to samples taken after fasting for at least 8 hours.  ?Glucose, capillary     Status: Abnormal  ? Collection Time: 10/23/21  8:12 PM  ?Result Value Ref Range  ? Glucose-Capillary 113 (H) 70 - 99 mg/dL  ?  Comment: Glucose reference range applies only to samples taken after fasting for at least 8 hours.  ?Glucose, capillary     Status: Abnormal  ? Collection Time: 10/24/21  7:50 AM  ?Result Value Ref Range  ? Glucose-Capillary 149 (H) 70 - 99 mg/dL  ?  Comment: Glucose reference range applies only to samples taken after fasting for at least 8 hours.  ? ? ?Blood Alcohol level:  ?Lab Results  ?Component Value Date  ? ETH <10 09/20/2021  ? ETH <10 10/13/2020  ? ? ?Metabolic Disorder Labs: ?Lab Results  ?Component Value Date  ? HGBA1C 8.0 (H) 09/27/2021  ? MPG 182.9 09/27/2021  ? MPG 180.03 10/13/2020  ? ?No results found for: PROLACTIN ?Lab Results  ?Component Value Date  ? CHOL 267 (H) 10/13/2020  ? TRIG 180 (H) 10/13/2020  ? HDL 86 10/13/2020  ? CHOLHDL 3.1 10/13/2020  ? VLDL 36 10/13/2020  ? LDLCALC 145 (H) 10/13/2020  ? LDLCALC 158 (H) 02/29/2020  ? ? ?Physical Findings: ?AIMS:  , ,  ,  ,    ?CIWA:    ?COWS:    ? ?Musculoskeletal: ?Strength & Muscle Tone: within normal limits ?Gait & Station: normal ?Patient leans: N/A ? ?Psychiatric Specialty Exam: ? ?Presentation  ?General Appearance: Bizarre; Disheveled ? ?Eye Contact:Fair ? ?Speech: wnl ?Speech Volume:Decreased ? ?Handedness:Right ? ? ?Mood and Affect  ?Mood: "good" ?Affect: restricted ? ?Thought Process  ?Thought Processes: appears organized ?Descriptions of Associations: n/a ?Orientation: aao x3 ?Thought Content: mostly  logical ? ?History of Schizophrenia/Schizoaffective disorder:Yes ? ?Duration of Psychotic Symptoms:Greater than six months ? ?Hallucinations:No data recorded ?Ideas of Reference:No data recorded ?Suicidal Thou

## 2021-10-25 DIAGNOSIS — F250 Schizoaffective disorder, bipolar type: Secondary | ICD-10-CM | POA: Diagnosis not present

## 2021-10-25 LAB — GLUCOSE, CAPILLARY
Glucose-Capillary: 165 mg/dL — ABNORMAL HIGH (ref 70–99)
Glucose-Capillary: 187 mg/dL — ABNORMAL HIGH (ref 70–99)
Glucose-Capillary: 231 mg/dL — ABNORMAL HIGH (ref 70–99)
Glucose-Capillary: 263 mg/dL — ABNORMAL HIGH (ref 70–99)

## 2021-10-25 LAB — CREATININE, SERUM
Creatinine, Ser: 2.61 mg/dL — ABNORMAL HIGH (ref 0.44–1.00)
GFR, Estimated: 20 mL/min — ABNORMAL LOW (ref >60)

## 2021-10-25 NOTE — Group Note (Signed)
LCSW Group Therapy Note ? ?Group Date: 10/25/2021 ?Start Time: 1300 ?End Time: 1400 ? ? ?Type of Therapy and Topic:  Group Therapy - Healthy vs Unhealthy Coping Skills ? ?Participation Level:  Did Not Attend  ? ?Description of Group ?The focus of this group was to determine what unhealthy coping techniques typically are used by group members and what healthy coping techniques would be helpful in coping with various problems. Patients were guided in becoming aware of the differences between healthy and unhealthy coping techniques. Patients were asked to identify 2-3 healthy coping skills they would like to learn to use more effectively. ? ?Therapeutic Goals ?Patients learned that coping is what human beings do all day long to deal with various situations in their lives ?Patients defined and discussed healthy vs unhealthy coping techniques ?Patients identified their preferred coping techniques and identified whether these were healthy or unhealthy ?Patients determined 2-3 healthy coping skills they would like to become more familiar with and use more often. ?Patients provided support and ideas to each other ? ? ?Summary of Patient Progress: Patient did not attend group despite encouraged participation. Patient was observed talking on the phone during group.  ? ? ? ?Therapeutic Modalities ?Cognitive Behavioral Therapy ?Motivational Interviewing ? ?Kenna Gilbert [place], LCSWA ?10/25/2021  3:04 PM   ?

## 2021-10-25 NOTE — Progress Notes (Addendum)
Patient denies SI, HI, and AVH. Patient came out for breakfast and attended to ADLs with NT. Patient is pleasant and cooperative. She actively participated in group this morning. Patient is compliant with all medications. ?

## 2021-10-25 NOTE — Progress Notes (Signed)
The patient had a cbg of 153 at snack time, no coverage was given as the order reads 4units three times a day. The patient received glucerna and snacks. She was compliant with her medications and slept during the entire shift. She denise SI/HI AVH.  ? ?Ivonne Andrew, RN ? ?

## 2021-10-25 NOTE — [facility] Group Notes (Signed)
Pt prticipated in Group: chair Yoga , PT was to herself but actively doing exercises  ?

## 2021-10-25 NOTE — Progress Notes (Signed)
[facility] MD Progress Note ? ?10/25/2021 10:50 AM ?[place]  ?MRN:  [id number] ?Principal Problem: Schizoaffective disorder, bipolar type ([place]) ?Diagnosis: Principal Problem: ?  Schizoaffective disorder, bipolar type ([place]) ? ?Patient is a  y.o. 61yo female who presents to the [facility] unit due to non-compliance with psych medications and not taking care of self. ?  ?  ?Interval History ?Patient was seen today for re-evaluation.  Nursing reports no events overnight. ?  ?Subjective:  On assessment patient reports feeling good and does not express any complaints or concerns. She participated in exercising group this morning. She denies feeling depressed, anxious, suicidal, homicidal. Denies any hallucinations and does not express delusions. Her BP had improved after last BP medication adjustment. She is waiting for the appropriate placement. ? ? ?Total Time spent with patient: 20 minutes ? ?Past Psychiatric History:  ? ?Past Medical History:  ?Past Medical History:  ?Diagnosis Date  ? Depression   ? Diabetes mellitus without complication ([name])   ? Diabetes mellitus, type II ([place])   ? Hypertension   ? Schizophrenia ([place])   ? History reviewed. No pertinent surgical history. ?Family History:  ?Family History  ?Problem Relation Age of Onset  ? Diabetes Mother   ? Glaucoma Mother   ? Heart disease Mother   ? Heart attack Mother   ? Depression Mother   ? Hypertension Mother   ? Diabetes Father   ? Glaucoma Father   ? Hypertension Father   ? Diabetes Sister   ? Depression Sister   ? Post-traumatic stress disorder Sister   ? ?Family Psychiatric  History:  ?Social History:  ?Social History  ? ?Substance and Sexual Activity  ?Alcohol Use No  ? Alcohol/week: 0.0 standard drinks  ?   ?Social History  ? ?Substance and Sexual Activity  ?Drug Use No  ?  ?Social History  ? ?Socioeconomic History  ? Marital status: Single  ?  Spouse name: Not on file  ? Number of children: Not on file  ? Years of education: Not on file  ? Highest education  level: Not on file  ?Occupational History  ? Not on file  ?Tobacco Use  ? Smoking status: Every Day  ?  Packs/day: 1.00  ?  Types: Cigarettes  ?  Start date: 04/21/1978  ? Smokeless tobacco: Never  ?Vaping Use  ? Vaping Use: Never used  ?Substance and Sexual Activity  ? Alcohol use: No  ?  Alcohol/week: 0.0 standard drinks  ? Drug use: No  ? Sexual activity: Not Currently  ?Other Topics Concern  ? Not on file  ?Social History Narrative  ? Not on file  ? ?Social Determinants of Health  ? ?Financial Resource Strain: Not on file  ?Food Insecurity: Not on file  ?Transportation Needs: Not on file  ?Physical Activity: Not on file  ?Stress: Not on file  ?Social Connections: Not on file  ? ?Additional Social History:  ?  ?  ?  ?  ?  ?  ?  ?  ?  ?  ?  ? ?Sleep: Fair ? ?Appetite:  Fair ? ?Current Medications: ?Current Facility-Administered Medications  ?Medication Dose Route Frequency Provider Last Rate Last Admin  ? acetaminophen (TYLENOL) tablet 650 mg  650 mg Oral Q6H PRN Caroline Sauger, NP   650 mg at 10/12/21 1031  ? alum & mag hydroxide-simeth (MAALOX/MYLANTA) 200-200-20 MG/5ML suspension 30 mL  30 mL Oral Q4H PRN Parks Ranger, DO   30 mL at 10/01/21 2215  ?  cyclobenzaprine (FLEXERIL) tablet 10 mg  10 mg Oral QHS Parks Ranger, DO   10 mg at 10/24/21 2207  ? diltiazem (CARDIZEM CD) 24 hr capsule 240 mg  240 mg Oral Daily Parks Ranger, DO   240 mg at 10/25/21 0900  ? escitalopram (LEXAPRO) tablet 10 mg  10 mg Oral Daily Parks Ranger, DO   10 mg at 10/25/21 0900  ? feeding supplement (GLUCERNA SHAKE) (GLUCERNA SHAKE) liquid 237 mL  237 mL Oral TID BM Clapacs, John T, MD   237 mL at 10/25/21 0900  ? Glycerin (Adult) 2 g suppository 1 suppository  1 suppository Rectal Daily PRN Parks Ranger, DO   1 suppository at 10/19/21 1538  ? hydrALAZINE (APRESOLINE) tablet 50 mg  50 mg Oral TID Parks Ranger, DO   50 mg at 10/25/21 0747  ? insulin aspart (novoLOG)  injection 4 Units  4 Units Subcutaneous TID WC Parks Ranger, DO   4 Units at 10/25/21 1191  ? linagliptin (TRADJENTA) tablet 5 mg  5 mg Oral Daily Parks Ranger, DO   5 mg at 10/25/21 0900  ? OLANZapine (ZYPREXA) tablet 10 mg  10 mg Oral QHS Parks Ranger, DO   10 mg at 10/24/21 2207  ? ondansetron (ZOFRAN) tablet 4 mg  4 mg Oral Q6H PRN Parks Ranger, DO      ? Or  ? ondansetron (ZOFRAN) injection 4 mg  4 mg Intravenous Q6H PRN Parks Ranger, DO      ? polyethylene glycol (MIRALAX / GLYCOLAX) packet 17 g  17 g Oral QPC breakfast Parks Ranger, DO   17 g at 10/25/21 0900  ? senna-docusate (Senokot-S) tablet 1 tablet  1 tablet Oral QHS Parks Ranger, DO   1 tablet at 10/24/21 2207  ? sorbitol, milk of mag, mineral oil, glycerin (SMOG) enema  960 mL Rectal Daily PRN Parks Ranger, DO   960 mL at 10/19/21 2202  ? traZODone (DESYREL) tablet 100 mg  100 mg Oral QHS Parks Ranger, DO   100 mg at 10/24/21 2206  ? ? ?Lab Results:  ?Results for orders placed or performed during the hospital encounter of 09/29/21 (from the past 48 hour(s))  ?Glucose, capillary     Status: Abnormal  ? Collection Time: 10/23/21 11:40 AM  ?Result Value Ref Range  ? Glucose-Capillary 140 (H) 70 - 99 mg/dL  ?  Comment: Glucose reference range applies only to samples taken after fasting for at least 8 hours.  ?Glucose, capillary     Status: Abnormal  ? Collection Time: 10/23/21  4:31 PM  ?Result Value Ref Range  ? Glucose-Capillary 285 (H) 70 - 99 mg/dL  ?  Comment: Glucose reference range applies only to samples taken after fasting for at least 8 hours.  ?Glucose, capillary     Status: Abnormal  ? Collection Time: 10/23/21  8:12 PM  ?Result Value Ref Range  ? Glucose-Capillary 113 (H) 70 - 99 mg/dL  ?  Comment: Glucose reference range applies only to samples taken after fasting for at least 8 hours.  ?Glucose, capillary     Status: Abnormal  ? Collection  Time: 10/24/21  7:50 AM  ?Result Value Ref Range  ? Glucose-Capillary 149 (H) 70 - 99 mg/dL  ?  Comment: Glucose reference range applies only to samples taken after fasting for at least 8 hours.  ?Glucose, capillary     Status: Abnormal  ? Collection  Time: 10/24/21 11:45 AM  ?Result Value Ref Range  ? Glucose-Capillary 186 (H) 70 - 99 mg/dL  ?  Comment: Glucose reference range applies only to samples taken after fasting for at least 8 hours.  ?Glucose, capillary     Status: Abnormal  ? Collection Time: 10/24/21  4:04 PM  ?Result Value Ref Range  ? Glucose-Capillary 150 (H) 70 - 99 mg/dL  ?  Comment: Glucose reference range applies only to samples taken after fasting for at least 8 hours.  ?Glucose, capillary     Status: Abnormal  ? Collection Time: 10/24/21  8:29 PM  ?Result Value Ref Range  ? Glucose-Capillary 153 (H) 70 - 99 mg/dL  ?  Comment: Glucose reference range applies only to samples taken after fasting for at least 8 hours.  ?Glucose, capillary     Status: Abnormal  ? Collection Time: 10/25/21  7:14 AM  ?Result Value Ref Range  ? Glucose-Capillary 165 (H) 70 - 99 mg/dL  ?  Comment: Glucose reference range applies only to samples taken after fasting for at least 8 hours.  ?Creatinine, serum     Status: Abnormal  ? Collection Time: 10/25/21 10:12 AM  ?Result Value Ref Range  ? Creatinine, Ser 2.61 (H) 0.44 - 1.00 mg/dL  ? GFR, Estimated 20 (L) >60 mL/min  ?  Comment: (NOTE) ?Calculated using the CKD-EPI Creatinine Equation (2021) ?Performed at [facility], [place], ?[place] [address] ?  ? ? ?Blood Alcohol level:  ?Lab Results  ?Component Value Date  ? ETH <10 09/20/2021  ? ETH <10 10/13/2020  ? ? ?Metabolic Disorder Labs: ?Lab Results  ?Component Value Date  ? HGBA1C 8.0 (H) 09/27/2021  ? MPG 182.9 09/27/2021  ? MPG 180.03 10/13/2020  ? ?No results found for: PROLACTIN ?Lab Results  ?Component Value Date  ? CHOL 267 (H) 10/13/2020  ? TRIG 180 (H) 10/13/2020  ? HDL 86 10/13/2020  ?  CHOLHDL 3.1 10/13/2020  ? VLDL 36 10/13/2020  ? LDLCALC 145 (H) 10/13/2020  ? LDLCALC 158 (H) 02/29/2020  ? ? ?Physical Findings: ?AIMS:  , ,  ,  ,    ?CIWA:    ?COWS:    ? ?Musculoskeletal: ?Strength & Mu

## 2021-10-25 NOTE — BH IP Treatment Plan (Signed)
Interdisciplinary Treatment and Diagnostic Plan Update ? ?10/25/2021 ?Time of Session: 8:00AM ?[place] ?MRN: [id number] ? ?Principal Diagnosis: Schizoaffective disorder, bipolar type ([place]) ? ?Secondary Diagnoses: Principal Problem: ?  Schizoaffective disorder, bipolar type ([place]) ? ? ?Current Medications:  ?Current Facility-Administered Medications  ?Medication Dose Route Frequency Provider Last Rate Last Admin  ? acetaminophen (TYLENOL) tablet 650 mg  650 mg Oral Q6H PRN Caroline Sauger, NP   650 mg at 10/12/21 1031  ? alum & mag hydroxide-simeth (MAALOX/MYLANTA) 200-200-20 MG/5ML suspension 30 mL  30 mL Oral Q4H PRN Parks Ranger, DO   30 mL at 10/01/21 2215  ? cyclobenzaprine (FLEXERIL) tablet 10 mg  10 mg Oral QHS Parks Ranger, DO   10 mg at 10/24/21 2207  ? diltiazem (CARDIZEM CD) 24 hr capsule 240 mg  240 mg Oral Daily Parks Ranger, DO   240 mg at 10/24/21 6789  ? escitalopram (LEXAPRO) tablet 10 mg  10 mg Oral Daily Parks Ranger, DO   10 mg at 10/24/21 3810  ? feeding supplement (GLUCERNA SHAKE) (GLUCERNA SHAKE) liquid 237 mL  237 mL Oral TID BM Clapacs, Madie Reno, MD   237 mL at 10/24/21 2045  ? Glycerin (Adult) 2 g suppository 1 suppository  1 suppository Rectal Daily PRN Parks Ranger, DO   1 suppository at 10/19/21 1538  ? hydrALAZINE (APRESOLINE) tablet 50 mg  50 mg Oral TID Parks Ranger, DO   50 mg at 10/25/21 0747  ? insulin aspart (novoLOG) injection 4 Units  4 Units Subcutaneous TID WC Parks Ranger, DO   4 Units at 10/25/21 1751  ? linagliptin (TRADJENTA) tablet 5 mg  5 mg Oral Daily Parks Ranger, DO   5 mg at 10/24/21 0258  ? OLANZapine (ZYPREXA) tablet 10 mg  10 mg Oral QHS Parks Ranger, DO   10 mg at 10/24/21 2207  ? ondansetron (ZOFRAN) tablet 4 mg  4 mg Oral Q6H PRN Parks Ranger, DO      ? Or  ? ondansetron (ZOFRAN) injection 4 mg  4 mg Intravenous Q6H PRN Parks Ranger, DO      ? polyethylene glycol (MIRALAX / GLYCOLAX) packet 17 g  17 g Oral QPC breakfast Parks Ranger, DO   17 g at 10/24/21 5277  ? senna-docusate (Senokot-S) tablet 1 tablet  1 tablet Oral QHS Parks Ranger, DO   1 tablet at 10/24/21 2207  ? sorbitol, milk of mag, mineral oil, glycerin (SMOG) enema  960 mL Rectal Daily PRN Parks Ranger, DO   960 mL at 10/19/21 2202  ? traZODone (DESYREL) tablet 100 mg  100 mg Oral QHS Parks Ranger, DO   100 mg at 10/24/21 2206  ? ?PTA Medications: ?Medications Prior to Admission  ?Medication Sig Dispense Refill Last Dose  ? amLODipine (NORVASC) 10 MG tablet Take 1 tablet (10 mg total) by mouth daily.     ? [EXPIRED] cefdinir (OMNICEF) 300 MG capsule Take 1 capsule (300 mg total) by mouth daily for 3 days. 3 capsule 0   ? hydrALAZINE (APRESOLINE) 50 MG tablet Take 1 tablet (50 mg total) by mouth 3 (three) times daily.     ? hydrochlorothiazide (HYDRODIURIL) 12.5 MG tablet Take 1 tablet (12.5 mg total) by mouth daily.     ? ? ?Patient Stressors: Health problems   ? ?Patient Strengths: Religious Affiliation  ? ?Treatment Modalities: Medication Management, Group therapy, Case management,  ?1 to  1 session with clinician, Psychoeducation, Recreational therapy. ? ? ?Physician Treatment Plan for Primary Diagnosis: Schizoaffective disorder, bipolar type ([place]) ?Long Term Goal(s): Improvement in symptoms so as ready for discharge  ? ?Short Term Goals: Ability to identify changes in lifestyle to reduce recurrence of condition will improve ?Ability to verbalize feelings will improve ?Ability to disclose and discuss suicidal ideas ?Ability to demonstrate self-control will improve ?Ability to identify and develop effective coping behaviors will improve ?Ability to maintain clinical measurements within normal limits will improve ?Compliance with prescribed medications will improve ?Ability to identify triggers associated with substance  abuse/mental health issues will improve ? ?Medication Management: Evaluate patient's response, side effects, and tolerance of medication regimen. ? ?Therapeutic Interventions: 1 to 1 sessions, Unit Group sessions and Medication administration. ? ?Evaluation of Outcomes: Progressing ? ?Physician Treatment Plan for Secondary Diagnosis: Principal Problem: ?  Schizoaffective disorder, bipolar type ([place]) ? ?Long Term Goal(s): Improvement in symptoms so as ready for discharge  ? ?Short Term Goals: Ability to identify changes in lifestyle to reduce recurrence of condition will improve ?Ability to verbalize feelings will improve ?Ability to disclose and discuss suicidal ideas ?Ability to demonstrate self-control will improve ?Ability to identify and develop effective coping behaviors will improve ?Ability to maintain clinical measurements within normal limits will improve ?Compliance with prescribed medications will improve ?Ability to identify triggers associated with substance abuse/mental health issues will improve    ? ?Medication Management: Evaluate patient's response, side effects, and tolerance of medication regimen. ? ?Therapeutic Interventions: 1 to 1 sessions, Unit Group sessions and Medication administration. ? ?Evaluation of Outcomes: Progressing ? ? ?RN Treatment Plan for Primary Diagnosis: Schizoaffective disorder, bipolar type ([place]) ?Long Term Goal(s): Knowledge of disease and therapeutic regimen to maintain health will improve ? ?Short Term Goals: Ability to remain free from injury will improve, Ability to verbalize frustration and anger appropriately will improve, Ability to demonstrate self-control, Ability to participate in decision making will improve, Ability to verbalize feelings will improve, Ability to identify and develop effective coping behaviors will improve, and Compliance with prescribed medications will improve ? ?Medication Management: RN will administer medications as ordered by provider,  will assess and evaluate patient's response and provide education to patient for prescribed medication. RN will report any adverse and/or side effects to prescribing provider. ? ?Therapeutic Interventions: 1 on 1 counseling sessions, Psychoeducation, Medication administration, Evaluate responses to treatment, Monitor vital signs and CBGs as ordered, Perform/monitor CIWA, COWS, AIMS and Fall Risk screenings as ordered, Perform wound care treatments as ordered. ? ?Evaluation of Outcomes: Progressing ? ? ?LCSW Treatment Plan for Primary Diagnosis: Schizoaffective disorder, bipolar type ([place]) ?Long Term Goal(s): Safe transition to appropriate next level of care at discharge, Engage patient in therapeutic group addressing interpersonal concerns. ? ?Short Term Goals: Engage patient in aftercare planning with referrals and resources, Increase social support, Increase ability to appropriately verbalize feelings, Increase emotional regulation, Facilitate acceptance of mental health diagnosis and concerns, Identify triggers associated with mental health/substance abuse issues, and Increase skills for wellness and recovery ? ?Therapeutic Interventions: Assess for all discharge needs, 1 to 1 time with Education officer, museum, Explore available resources and support systems, Assess for adequacy in community support network, Educate family and significant other(s) on suicide prevention, Complete Psychosocial Assessment, Interpersonal group therapy. ? ?Evaluation of Outcomes: Progressing ? ? ?Progress in Treatment: ?Attending groups: Yes. ?Participating in groups: Yes. ?Taking medication as prescribed: Yes. ?Toleration medication: Yes. ?Family/Significant other contact made: Yes, individual(s) contacted:  SPE completed with [name] ?  Patient understands diagnosis: No. ?Discussing patient identified problems/goals with staff: Yes. ?Medical problems stabilized or resolved: Yes. ?Denies suicidal/homicidal ideation: Yes. ?Issues/concerns per  patient self-inventory: No. ?Other: None. ? ?New problem(s) identified: No, Describe:  None. ? ?New Short Term/Long Term Goal(s): Patient to work towards elimination of symptoms of psychosis, medication management for

## 2021-10-26 DIAGNOSIS — F250 Schizoaffective disorder, bipolar type: Secondary | ICD-10-CM | POA: Diagnosis not present

## 2021-10-26 LAB — GLUCOSE, CAPILLARY
Glucose-Capillary: 176 mg/dL — ABNORMAL HIGH (ref 70–99)
Glucose-Capillary: 156 mg/dL — ABNORMAL HIGH (ref 70–99)
Glucose-Capillary: 239 mg/dL — ABNORMAL HIGH (ref 70–99)
Glucose-Capillary: 182 mg/dL — ABNORMAL HIGH (ref 70–99)

## 2021-10-26 NOTE — Progress Notes (Signed)
[facility] MD Progress Note ? ?10/26/2021 11:08 AM ?[place]  ?MRN:  [id number] ?Subjective: Alicia Rogers was seen today.  She is lying in bed.  I encouraged her to drink Gatorade that is next to her.  She has no complaints today.  She denies any depression or suicidal ideation.  She denies any side effects from the medication.  Social work is continuing to look placement. ? ?Principal Problem: Schizoaffective disorder, bipolar type ([place]) ?Diagnosis: Principal Problem: ?  Schizoaffective disorder, bipolar type ([place]) ? ?Total Time spent with patient: 15 minutes ? ?Past Psychiatric History: [facility] ? ?Past Medical History:  ?Past Medical History:  ?Diagnosis Date  ? Depression   ? Diabetes mellitus without complication ([place])   ? Diabetes mellitus, type II ([place])   ? Hypertension   ? Schizophrenia ([place])   ? History reviewed. No pertinent surgical history. ?Family History:  ?Family History  ?Problem Relation Age of Onset  ? Diabetes Mother   ? Glaucoma Mother   ? Heart disease Mother   ? Heart attack Mother   ? Depression Mother   ? Hypertension Mother   ? Diabetes Father   ? Glaucoma Father   ? Hypertension Father   ? Diabetes Sister   ? Depression Sister   ? Post-traumatic stress disorder Sister   ? ? ?Social History:  ?Social History  ? ?Substance and Sexual Activity  ?Alcohol Use No  ? Alcohol/week: 0.0 standard drinks  ?   ?Social History  ? ?Substance and Sexual Activity  ?Drug Use No  ?  ?Social History  ? ?Socioeconomic History  ? Marital status: Single  ?  Spouse name: Not on file  ? Number of children: Not on file  ? Years of education: Not on file  ? Highest education level: Not on file  ?Occupational History  ? Not on file  ?Tobacco Use  ? Smoking status: Every Day  ?  Packs/day: 1.00  ?  Types: Cigarettes  ?  Start date: 04/21/1978  ? Smokeless tobacco: Never  ?Vaping Use  ? Vaping Use: Never used  ?Substance and Sexual Activity  ? Alcohol use: No  ?  Alcohol/week: 0.0 standard drinks  ? Drug  use: No  ? Sexual activity: Not Currently  ?Other Topics Concern  ? Not on file  ?Social History Narrative  ? Not on file  ? ?Social Determinants of Health  ? ?Financial Resource Strain: Not on file  ?Food Insecurity: Not on file  ?Transportation Needs: Not on file  ?Physical Activity: Not on file  ?Stress: Not on file  ?Social Connections: Not on file  ? ?Additional Social History:  ?  ?  ?  ?  ?  ?  ?  ?  ?  ?  ?  ? ?Sleep: Good ? ?Appetite:  Good ? ?Current Medications: ?Current Facility-Administered Medications  ?Medication Dose Route Frequency Provider Last Rate Last Admin  ? acetaminophen (TYLENOL) tablet 650 mg  650 mg Oral Q6H PRN Caroline Sauger, NP   650 mg at 10/12/21 1031  ? alum & mag hydroxide-simeth (MAALOX/MYLANTA) 200-200-20 MG/5ML suspension 30 mL  30 mL Oral Q4H PRN Parks Ranger, DO   30 mL at 10/01/21 2215  ? cyclobenzaprine (FLEXERIL) tablet 10 mg  10 mg Oral QHS Parks Ranger, DO   10 mg at 10/25/21 2107  ? diltiazem (CARDIZEM CD) 24 hr capsule 240 mg  240 mg Oral Daily Parks Ranger, DO   240 mg at 10/26/21 2440  ?  escitalopram (LEXAPRO) tablet 10 mg  10 mg Oral Daily Parks Ranger, DO   10 mg at 10/26/21 1287  ? feeding supplement (GLUCERNA SHAKE) (GLUCERNA SHAKE) liquid 237 mL  237 mL Oral TID BM Clapacs, Madie Reno, MD   237 mL at 10/26/21 0925  ? Glycerin (Adult) 2 g suppository 1 suppository  1 suppository Rectal Daily PRN Parks Ranger, DO   1 suppository at 10/19/21 1538  ? hydrALAZINE (APRESOLINE) tablet 50 mg  50 mg Oral TID Parks Ranger, DO   50 mg at 10/26/21 8676  ? insulin aspart (novoLOG) injection 4 Units  4 Units Subcutaneous TID WC Parks Ranger, DO   4 Units at 10/26/21 7209  ? linagliptin (TRADJENTA) tablet 5 mg  5 mg Oral Daily Parks Ranger, DO   5 mg at 10/26/21 4709  ? OLANZapine (ZYPREXA) tablet 10 mg  10 mg Oral QHS Parks Ranger, DO   10 mg at 10/25/21 2107  ? ondansetron  (ZOFRAN) tablet 4 mg  4 mg Oral Q6H PRN Parks Ranger, DO      ? Or  ? ondansetron (ZOFRAN) injection 4 mg  4 mg Intravenous Q6H PRN Parks Ranger, DO      ? polyethylene glycol (MIRALAX / GLYCOLAX) packet 17 g  17 g Oral QPC breakfast Parks Ranger, DO   17 g at 10/26/21 6283  ? senna-docusate (Senokot-S) tablet 1 tablet  1 tablet Oral QHS Parks Ranger, DO   1 tablet at 10/25/21 2107  ? sorbitol, milk of mag, mineral oil, glycerin (SMOG) enema  960 mL Rectal Daily PRN Parks Ranger, DO   960 mL at 10/19/21 2202  ? traZODone (DESYREL) tablet 100 mg  100 mg Oral QHS Parks Ranger, DO   100 mg at 10/25/21 2107  ? ? ?Lab Results:  ?Results for orders placed or performed during the hospital encounter of 09/29/21 (from the past 48 hour(s))  ?Glucose, capillary     Status: Abnormal  ? Collection Time: 10/24/21 11:45 AM  ?Result Value Ref Range  ? Glucose-Capillary 186 (H) 70 - 99 mg/dL  ?  Comment: Glucose reference range applies only to samples taken after fasting for at least 8 hours.  ?Glucose, capillary     Status: Abnormal  ? Collection Time: 10/24/21  4:04 PM  ?Result Value Ref Range  ? Glucose-Capillary 150 (H) 70 - 99 mg/dL  ?  Comment: Glucose reference range applies only to samples taken after fasting for at least 8 hours.  ?Glucose, capillary     Status: Abnormal  ? Collection Time: 10/24/21  8:29 PM  ?Result Value Ref Range  ? Glucose-Capillary 153 (H) 70 - 99 mg/dL  ?  Comment: Glucose reference range applies only to samples taken after fasting for at least 8 hours.  ?Glucose, capillary     Status: Abnormal  ? Collection Time: 10/25/21  7:14 AM  ?Result Value Ref Range  ? Glucose-Capillary 165 (H) 70 - 99 mg/dL  ?  Comment: Glucose reference range applies only to samples taken after fasting for at least 8 hours.  ?Creatinine, serum     Status: Abnormal  ? Collection Time: 10/25/21 10:12 AM  ?Result Value Ref Range  ? Creatinine, Ser 2.61 (H) 0.44 -  1.00 mg/dL  ? GFR, Estimated 20 (L) >60 mL/min  ?  Comment: (NOTE) ?Calculated using the CKD-EPI Creatinine Equation (2021) ?Performed at [facility], [place], ?  [place] [address] ?  ?Glucose, capillary     Status: Abnormal  ? Collection Time: 10/25/21 11:25 AM  ?Result Value Ref Range  ? Glucose-Capillary 187 (H) 70 - 99 mg/dL  ?  Comment: Glucose reference range applies only to samples taken after fasting for at least 8 hours.  ?Glucose, capillary     Status: Abnormal  ? Collection Time: 10/25/21  3:52 PM  ?Result Value Ref Range  ? Glucose-Capillary 231 (H) 70 - 99 mg/dL  ?  Comment: Glucose reference range applies only to samples taken after fasting for at least 8 hours.  ? Comment 1 Notify RN   ?Glucose, capillary     Status: Abnormal  ? Collection Time: 10/25/21 10:08 PM  ?Result Value Ref Range  ? Glucose-Capillary 263 (H) 70 - 99 mg/dL  ?  Comment: Glucose reference range applies only to samples taken after fasting for at least 8 hours.  ? Comment 1 Notify RN   ?Glucose, capillary     Status: Abnormal  ? Collection Time: 10/26/21  9:01 AM  ?Result Value Ref Range  ? Glucose-Capillary 176 (H) 70 - 99 mg/dL  ?  Comment: Glucose reference range applies only to samples taken after fasting for at least 8 hours.  ? ? ?Blood Alcohol level:  ?Lab Results  ?Component Value Date  ? ETH <10 09/20/2021  ? ETH <10 10/13/2020  ? ? ?Metabolic Disorder Labs: ?Lab Results  ?Component Value Date  ? HGBA1C 8.0 (H) 09/27/2021  ? MPG 182.9 09/27/2021  ? MPG 180.03 10/13/2020  ? ?No results found for: PROLACTIN ?Lab Results  ?Component Value Date  ? CHOL 267 (H) 10/13/2020  ? TRIG 180 (H) 10/13/2020  ? HDL 86 10/13/2020  ? CHOLHDL 3.1 10/13/2020  ? VLDL 36 10/13/2020  ? LDLCALC 145 (H) 10/13/2020  ? LDLCALC 158 (H) 02/29/2020  ? ? ?Physical Findings: ?AIMS:  , ,  ,  ,    ?CIWA:    ?COWS:    ? ?Musculoskeletal: ?Strength & Muscle Tone: within normal limits ?Gait & Station: normal ?Patient leans:  N/A ? ?Psychiatric Specialty Exam: ? ?Presentation  ?General Appearance: Bizarre; Disheveled ? ?Eye Contact:Fair ? ?Speech:Garbled ? ?Speech Volume:Decreased ? ?Handedness:Right ? ? ?Mood and Affect  ?Mood:No dat

## 2021-10-26 NOTE — BHH Counselor (Signed)
CSW contacted Robet Leu, [phone], at [facility] case worker to get update on pt's status regarding placement to facility.  ?  ?CSW provided updates on a few pending placements to [place] and [place] and no answer from [facility].  ?  ?CSW left voicemail regarding information as there was no answer. CSW will follow up at another time.  ? ?Billal Rollo [place], MSW, LCSW-A ?3/27/20234:11 PM  ?

## 2021-10-26 NOTE — BHH Counselor (Signed)
CSW contacted  Alicia Rogers  [phone], admissions at [facility]. CSW received no answer. CSW left confidential voicemail. CSW will follow up at another time .  ? ?Whisper Kurka [place], MSW, LCSW-A ?3/27/20233:59 PM  ?

## 2021-10-26 NOTE — Progress Notes (Signed)
Recreation Therapy Notes ? ? ?Date: 10/26/2021 ?  ?Time: 1:00pm  ?  ?Location: Craft room     ?  ?Behavioral response: N/A ?  ?Intervention Topic: Time Management  ?  ?Discussion/Intervention: ?Unable to do group due to call bell going off.  ?  ?Clinical Observations/Feedback:  ?Unable to do group due to call bell going off.  ? ?Klair Leising LRT/CTRS ? ? ? ? ? ? ? ?Alicia Rogers ?10/26/2021 2:04 PM ?

## 2021-10-26 NOTE — BHH Counselor (Signed)
CSW reached out to [place] at [facility], [phone] and was not able to leave a voice mail as there was no answer.  ?  ?CSW will follow up at another time regarding openings and update on pending referral.  ? ?Delane Stalling [place], MSW, LCSW-A ?3/27/20234:06 PM  ?

## 2021-10-26 NOTE — Plan of Care (Signed)
Patient remains alert and oriented X3 with soft and delayed speech. Continues to require assistance with ADL's. Ambulatory with slow and unsteady gait. Denies pain or discomfort at this time. Denise depression and anxiety. Denies SI, HI, and AVH. Patient encouraged to ask for assistance. Ate breakfast in the day room with good appetite. AM blood sugar 176-coverage provided as ordered. Patient is currently in bed and in no apparent distress. Q 15 minute safety checks continue. ? ? ?Problem: Education: ?Goal: Knowledge of [place] [facility] Education information/materials will improve ?Outcome: Progressing ?Goal: Emotional status will improve ?Outcome: Progressing ?Goal: Mental status will improve ?Outcome: Progressing ?Goal: Verbalization of understanding the information provided will improve ?Outcome: Progressing ?  ?Problem: Activity: ?Goal: Interest or engagement in activities will improve ?Outcome: Progressing ?Goal: Sleeping patterns will improve ?Outcome: Progressing ?  ?Problem: Coping: ?Goal: Ability to verbalize frustrations and anger appropriately will improve ?Outcome: Progressing ?Goal: Ability to demonstrate self-control will improve ?Outcome: Progressing ?  ?Problem: Health Behavior/Discharge Planning: ?Goal: Identification of resources available to assist in meeting health care needs will improve ?Outcome: Progressing ?Goal: Compliance with treatment plan for underlying cause of condition will improve ?Outcome: Progressing ?  ?Problem: Physical Regulation: ?Goal: Ability to maintain clinical measurements within normal limits will improve ?Outcome: Progressing ?  ?Problem: Safety: ?Goal: Periods of time without injury will increase ?Outcome: Progressing ?  ?

## 2021-10-26 NOTE — Progress Notes (Signed)
?  Patient interacting well with Peers and Staff. Stayed in the Milieu this evening. Denies SI/H/A/VH.Scheduled medications administered per Provider order. Support and encouragement provided. ? ?Routine safety checks conducted. Patient notified to inform staff with problems or concerns. No adverse drug reactions noted. Patient contracts for safety at this time.  ?

## 2021-10-27 DIAGNOSIS — F250 Schizoaffective disorder, bipolar type: Secondary | ICD-10-CM | POA: Diagnosis not present

## 2021-10-27 LAB — GLUCOSE, CAPILLARY
Glucose-Capillary: 188 mg/dL — ABNORMAL HIGH (ref 70–99)
Glucose-Capillary: 163 mg/dL — ABNORMAL HIGH (ref 70–99)
Glucose-Capillary: 81 mg/dL (ref 70–99)
Glucose-Capillary: 100 mg/dL — ABNORMAL HIGH (ref 70–99)

## 2021-10-27 MED ORDER — RENA-VITE PO TABS
1.0000 | ORAL_TABLET | Freq: Every day | ORAL | Status: DC
  Administered 2021-10-27 – 2021-11-09 (×14): 1 via ORAL
  Filled 2021-10-27 (×15): qty 1

## 2021-10-27 NOTE — BHH Counselor (Signed)
CSW sent fax information to the following places for placement.  ? ?[facility], Hilda Blades in admissions,  [phone]. CSW contacted to updated regarding receipt of fax and left voicemail with contact information.  ? ?Janell Quiet, Admissions consulting services. Confirmation of receipt of referral sent.  ? ?[phone] ? ?FAX [phone] ? ?Email: [email or web address]  ? ?OR [email or web address] ? ? ?Shannin Naab [place], MSW, LCSW-A ?3/28/20233:53 PM  ?

## 2021-10-27 NOTE — Progress Notes (Signed)
[facility] MD Progress Note ? ?10/27/2021 12:35 PM ?[place]  ?MRN:  [id number] ?Subjective:  Alicia Rogers is stable.  She is pleasant and cooperative and participates.  She says that she is eating and drinking.  She is looking forward to being discharged when we find a place for her to go.  She denies any side effects from the medications.  Her creatinine did creep back up.  I may have to consider changing her discharge plans to a SNF. ? ?Principal Problem: Schizoaffective disorder, bipolar type ([place]) ?Diagnosis: Principal Problem: ?  Schizoaffective disorder, bipolar type ([place]) ? ?Total Time spent with patient: 15 minutes ? ?Past Psychiatric History: [facility] ? ?Past Medical History:  ?Past Medical History:  ?Diagnosis Date  ? Depression   ? Diabetes mellitus without complication ([place])   ? Diabetes mellitus, type II ([place])   ? Hypertension   ? Schizophrenia ([place])   ? History reviewed. No pertinent surgical history. ?Family History:  ?Family History  ?Problem Relation Age of Onset  ? Diabetes Mother   ? Glaucoma Mother   ? Heart disease Mother   ? Heart attack Mother   ? Depression Mother   ? Hypertension Mother   ? Diabetes Father   ? Glaucoma Father   ? Hypertension Father   ? Diabetes Sister   ? Depression Sister   ? Post-traumatic stress disorder Sister   ? ?Social History:  ?Social History  ? ?Substance and Sexual Activity  ?Alcohol Use No  ? Alcohol/week: 0.0 standard drinks  ?   ?Social History  ? ?Substance and Sexual Activity  ?Drug Use No  ?  ?Social History  ? ?Socioeconomic History  ? Marital status: Single  ?  Spouse name: Not on file  ? Number of children: Not on file  ? Years of education: Not on file  ? Highest education level: Not on file  ?Occupational History  ? Not on file  ?Tobacco Use  ? Smoking status: Every Day  ?  Packs/day: 1.00  ?  Types: Cigarettes  ?  Start date: 04/21/1978  ? Smokeless tobacco: Never  ?Vaping Use  ? Vaping Use: Never used  ?Substance and Sexual Activity   ? Alcohol use: No  ?  Alcohol/week: 0.0 standard drinks  ? Drug use: No  ? Sexual activity: Not Currently  ?Other Topics Concern  ? Not on file  ?Social History Narrative  ? Not on file  ? ?Social Determinants of Health  ? ?Financial Resource Strain: Not on file  ?Food Insecurity: Not on file  ?Transportation Needs: Not on file  ?Physical Activity: Not on file  ?Stress: Not on file  ?Social Connections: Not on file  ? ?Additional Social History:  ?  ?  ?  ?  ?  ?  ?  ?  ?  ?  ?  ? ?Sleep: Good ? ?Appetite:  Good ? ?Current Medications: ?Current Facility-Administered Medications  ?Medication Dose Route Frequency Provider Last Rate Last Admin  ? acetaminophen (TYLENOL) tablet 650 mg  650 mg Oral Q6H PRN Caroline Sauger, NP   650 mg at 10/12/21 1031  ? alum & mag hydroxide-simeth (MAALOX/MYLANTA) 200-200-20 MG/5ML suspension 30 mL  30 mL Oral Q4H PRN Parks Ranger, DO   30 mL at 10/01/21 2215  ? cyclobenzaprine (FLEXERIL) tablet 10 mg  10 mg Oral QHS Parks Ranger, DO   10 mg at 10/26/21 2129  ? diltiazem (CARDIZEM CD) 24 hr capsule 240 mg  240 mg  Oral Daily Parks Ranger, DO   240 mg at 10/27/21 4235  ? escitalopram (LEXAPRO) tablet 10 mg  10 mg Oral Daily Parks Ranger, DO   10 mg at 10/27/21 0831  ? feeding supplement (GLUCERNA SHAKE) (GLUCERNA SHAKE) liquid 237 mL  237 mL Oral TID BM Clapacs, Madie [place], MD   237 mL at 10/27/21 0951  ? Glycerin (Adult) 2 g suppository 1 suppository  1 suppository Rectal Daily PRN Parks Ranger, DO   1 suppository at 10/19/21 1538  ? hydrALAZINE (APRESOLINE) tablet 50 mg  50 mg Oral TID Parks Ranger, DO   50 mg at 10/27/21 0831  ? insulin aspart (novoLOG) injection 4 Units  4 Units Subcutaneous TID WC Parks Ranger, DO   4 Units at 10/27/21 1223  ? linagliptin (TRADJENTA) tablet 5 mg  5 mg Oral Daily Parks Ranger, DO   5 mg at 10/27/21 0831  ? OLANZapine (ZYPREXA) tablet 10 mg  10 mg Oral QHS  Par[place] Ranger, DO   10 mg at 10/26/21 2130  ? ondansetron (ZOFRAN) tablet 4 mg  4 mg Oral Q6H PRN Parks Ranger, DO      ? Or  ? ondansetron (ZOFRAN) injection 4 mg  4 mg Intravenous Q6H PRN Parks Ranger, DO      ? polyethylene glycol (MIRALAX / GLYCOLAX) packet 17 g  17 g Oral QPC breakfast Parks Ranger, DO   17 g at 10/27/21 3614  ? senna-docusate (Senokot-S) tablet 1 tablet  1 tablet Oral QHS Parks Ranger, DO   1 tablet at 10/26/21 2130  ? sorbitol, milk of mag, mineral oil, glycerin (SMOG) enema  960 mL Rectal Daily PRN Parks Ranger, DO   960 mL at 10/19/21 2202  ? traZODone (DESYREL) tablet 100 mg  100 mg Oral QHS Parks Ranger, DO   100 mg at 10/26/21 2130  ? ? ?Lab Results:  ?Results for orders placed or performed during the hospital encounter of 09/29/21 (from the past 48 hour(s))  ?Glucose, capillary     Status: Abnormal  ? Collection Time: 10/25/21  3:52 PM  ?Result Value Ref Range  ? Glucose-Capillary 231 (H) 70 - 99 mg/dL  ?  Comment: Glucose reference range applies only to samples taken after fasting for at least 8 hours.  ? Comment 1 Notify RN   ?Glucose, capillary     Status: Abnormal  ? Collection Time: 10/25/21 10:08 PM  ?Result Value Ref Range  ? Glucose-Capillary 263 (H) 70 - 99 mg/dL  ?  Comment: Glucose reference range applies only to samples taken after fasting for at least 8 hours.  ? Comment 1 Notify RN   ?Glucose, capillary     Status: Abnormal  ? Collection Time: 10/26/21  9:01 AM  ?Result Value Ref Range  ? Glucose-Capillary 176 (H) 70 - 99 mg/dL  ?  Comment: Glucose reference range applies only to samples taken after fasting for at least 8 hours.  ?Glucose, capillary     Status: Abnormal  ? Collection Time: 10/26/21 11:50 AM  ?Result Value Ref Range  ? Glucose-Capillary 156 (H) 70 - 99 mg/dL  ?  Comment: Glucose reference range applies only to samples taken after fasting for at least 8 hours.  ?Glucose, capillary      Status: Abnormal  ? Collection Time: 10/26/21  4:27 PM  ?Result Value Ref Range  ? Glucose-Capillary 239 (H) 70 - 99 mg/dL  ?  Comment: Glucose reference range applies only to samples taken after fasting for at least 8 hours.  ?Glucose, capillary     Status: Abnormal  ? Collection Time: 10/26/21  8:13 PM  ?Result Value Ref Range  ? Glucose-Capillary 182 (H) 70 - 99 mg/dL  ?  Comment: Glucose reference range applies only to samples taken after fasting for at least 8 hours.  ? Comment 1 Notify RN   ?Glucose, capillary     Status: Abnormal  ? Collection Time: 10/27/21  8:27 AM  ?Result Value Ref Range  ? Glucose-Capillary 188 (H) 70 - 99 mg/dL  ?  Comment: Glucose reference range applies only to samples taken after fasting for at least 8 hours.  ?Glucose, capillary     Status: Abnormal  ? Collection Time: 10/27/21 11:45 AM  ?Result Value Ref Range  ? Glucose-Capillary 163 (H) 70 - 99 mg/dL  ?  Comment: Glucose reference range applies only to samples taken after fasting for at least 8 hours.  ? ? ?Blood Alcohol level:  ?Lab Results  ?Component Value Date  ? ETH <10 09/20/2021  ? ETH <10 10/13/2020  ? ? ?Metabolic Disorder Labs: ?Lab Results  ?Component Value Date  ? HGBA1C 8.0 (H) 09/27/2021  ? MPG 182.9 09/27/2021  ? MPG 180.03 10/13/2020  ? ?No results found for: PROLACTIN ?Lab Results  ?Component Value Date  ? CHOL 267 (H) 10/13/2020  ? TRIG 180 (H) 10/13/2020  ? HDL 86 10/13/2020  ? CHOLHDL 3.1 10/13/2020  ? VLDL 36 10/13/2020  ? LDLCALC 145 (H) 10/13/2020  ? LDLCALC 158 (H) 02/29/2020  ? ? ?Physical Findings: ?AIMS:  , ,  ,  ,    ?CIWA:    ?COWS:    ? ?Musculoskeletal: ?Strength & Muscle Tone: within normal limits ?Gait & Station: normal ?Patient leans: N/A ? ?Psychiatric Specialty Exam: ? ?Presentation  ?General Appearance: Bizarre; Disheveled ? ?Eye Contact:Fair ? ?Speech:Garbled ? ?Speech Volume:Decreased ? ?Handedness:Right ? ? ?Mood and Affect  ?Mood:No data recorded ?Affect:Flat ? ? ?Thought Process   ?Thought Processes:Disorganized ? ?Descriptions of Associations:Loose ? ?Orientation:Partial ? ?Thought Content:Illogical ? ?History of Schizophrenia/Schizoaffective disorder:Yes ? ?Duration of Psychotic Symptoms:G

## 2021-10-27 NOTE — BHH Group Notes (Signed)
[facility] Group Notes:  (Nursing/MHT/Case Management/Adjunct) ? ?Date:  10/27/2021  ?Time:  10:00 AM ? ?Type of Therapy:  Psychoeducational Skills ? ?Participation Level:  None ? ?Participation Quality:  None ? ?Affect:  Flat ? ?Cognitive:  Confused ? ?Insight:  Lacking ? ?Engagement in Group:  None ? ?Modes of Intervention:  Discussion ? ?Summary of Progress/Problems: ? The pt attended group but did not participate in verbal discussion. ?Juliette Alcide ?10/27/2021, 11:41 AM ?

## 2021-10-27 NOTE — Progress Notes (Signed)
Recreation Therapy Notes ?Date: 10/27/2021 ?  ?Time: 1:30pm  ?  ?Location: Day room  ?  ?Behavioral response: N/A ?  ?Intervention Topic: Problem Solving   ?  ?Discussion/Intervention: ?Patient refused to attend group. ?  ?Clinical Observations/Feedback: ?Patient refused to attend group. ?  ?Ridhima Golberg LRT/CTRS ? ? ? ? ? ? ? ?Merve Hotard ?10/27/2021 1:53 PM ?

## 2021-10-27 NOTE — Plan of Care (Addendum)
Patient Calm and Cooperative during shift.  Patient continues to have a delayed response and require minimal redirection.  Patient denies SI/HI/Avh and contracts for safety. Patient denies Anxiety/Depression. Patient did comply with scheduled medications.  Patient did verbalize Family as the most important thing as a healthy coping skill. Patient did participate in therapeutic milieu. Night snack and adequate fluids given. Q 15 minute safety checks in place.  Plan of care will continue. ? ? ?Problem: Education: ?Goal: Knowledge of [facility] Education information/materials will improve ?Outcome: Progressing ?Goal: Emotional status will improve ?Outcome: Progressing ?Goal: Mental status will improve ?Outcome: Progressing ?Goal: Verbalization of understanding the information provided will improve ?Outcome: Progressing ?  ?Problem: Activity: ?Goal: Interest or engagement in activities will improve ?Outcome: Progressing ?  ?

## 2021-10-27 NOTE — Plan of Care (Signed)
Patient remains alert and oriented X3 with soft and delayed speech. Continues to require assistance with ADL's. Ambulatory with slow and unsteady gait. Denies pain or discomfort at this time. Denise depression and anxiety. Denies SI, HI, and AVH. Patient encouraged to ask for assistance. Ate breakfast in the day room with good appetite. AM blood sugar coverage provided as ordered. Patient is currently in bed and in no apparent distress. Q 15 minute safety checks continue. ? ?Problem: Education: ?Goal: Knowledge of [place] [facility] Education information/materials will improve ?Outcome: Progressing ?Goal: Emotional status will improve ?Outcome: Progressing ?Goal: Mental status will improve ?Outcome: Progressing ?Goal: Verbalization of understanding the information provided will improve ?Outcome: Progressing ?  ?Problem: Activity: ?Goal: Interest or engagement in activities will improve ?Outcome: Progressing ?Goal: Sleeping patterns will improve ?Outcome: Progressing ?  ?Problem: Coping: ?Goal: Ability to verbalize frustrations and anger appropriately will improve ?Outcome: Progressing ?Goal: Ability to demonstrate self-control will improve ?Outcome: Progressing ?  ?Problem: Health Behavior/Discharge Planning: ?Goal: Identification of resources available to assist in meeting health care needs will improve ?Outcome: Progressing ?Goal: Compliance with treatment plan for underlying cause of condition will improve ?Outcome: Progressing ?  ?Problem: Physical Regulation: ?Goal: Ability to maintain clinical measurements within normal limits will improve ?Outcome: Progressing ?  ?Problem: Safety: ?Goal: Periods of time without injury will increase ?Outcome: Progressing ?  ?

## 2021-10-28 DIAGNOSIS — F250 Schizoaffective disorder, bipolar type: Secondary | ICD-10-CM | POA: Diagnosis not present

## 2021-10-28 LAB — GLUCOSE, CAPILLARY
Glucose-Capillary: 154 mg/dL — ABNORMAL HIGH (ref 70–99)
Glucose-Capillary: 177 mg/dL — ABNORMAL HIGH (ref 70–99)
Glucose-Capillary: 182 mg/dL — ABNORMAL HIGH (ref 70–99)
Glucose-Capillary: 275 mg/dL — ABNORMAL HIGH (ref 70–99)

## 2021-10-28 NOTE — Progress Notes (Signed)
Patient is calm and cooperative, has a flat affect and minimal interactions with peers and staff. She has delayed responses to commands and her ambulation is slow but steady. Continues to require assistance with ADLs. Patient verbalizes sleeping good last night. Patient denies AVH, SI, HI, anxiety or depression.  Patient ate breakfast and lunch in the day room with good appetite. AM BS -154 and coverage administered as ordered with  AM medications. Emotional support provided. Patient denies pain at this time. Will continue to monitor with Q 15 minutes safety checks. ?

## 2021-10-28 NOTE — BHH Group Notes (Signed)
Pt refused group: chair yoga  ?

## 2021-10-28 NOTE — Progress Notes (Signed)
Patient is seen in the dayroom interacting appropriately. She denies SI, HI, AVH, anxiety, depression, and pain. Pt voiced no complaints. She is safe on the unit at this time. Q 15 minute safety checks in place.  ?

## 2021-10-28 NOTE — Progress Notes (Signed)
[facility] MD Progress Note ? ?10/28/2021 11:45 AM ?Alicia Rogers  ?MRN:  [id number] ?Subjective:  Alicia Rogers is doing well.  I spoke with social work who is going to look into what it would take to get Alicia Rogers met Medicaid since it is taking so long for her to find a place to live and she cannot take care of herself.  She has kidney failure but she does not need SNF right now. ? ?Principal Problem: Schizoaffective disorder, bipolar type ([place]) ?Diagnosis: Principal Problem: ?  Schizoaffective disorder, bipolar type ([place]) ? ?Total Time spent with patient: 15 minutes ? ?Past Psychiatric History: [place] ? ?Past Medical History:  ?Past Medical History:  ?Diagnosis Date  ? Depression   ? Diabetes mellitus without complication ([place])   ? Diabetes mellitus, type II ([place])   ? Hypertension   ? Schizophrenia ([place])   ? History reviewed. No pertinent surgical history. ?Family History:  ?Family History  ?Problem Relation Age of Onset  ? Diabetes Mother   ? Glaucoma Mother   ? Heart disease Mother   ? Heart attack Mother   ? Depression Mother   ? Hypertension Mother   ? Diabetes Father   ? Glaucoma Father   ? Hypertension Father   ? Diabetes Sister   ? Depression Sister   ? Post-traumatic stress disorder Sister   ? ? ?Social History:  ?Social History  ? ?Substance and Sexual Activity  ?Alcohol Use No  ? Alcohol/week: 0.0 standard drinks  ?   ?Social History  ? ?Substance and Sexual Activity  ?Drug Use No  ?  ?Social History  ? ?Socioeconomic History  ? Marital status: Single  ?  Spouse name: Not on file  ? Number of children: Not on file  ? Years of education: Not on file  ? Highest education level: Not on file  ?Occupational History  ? Not on file  ?Tobacco Use  ? Smoking status: Every Day  ?  Packs/day: 1.00  ?  Types: Cigarettes  ?  Start date: 04/21/1978  ? Smokeless tobacco: Never  ?Vaping Use  ? Vaping Use: Never used  ?Substance and Sexual Activity  ? Alcohol use: No  ?  Alcohol/week: 0.0 standard drinks  ? Drug  use: No  ? Sexual activity: Not Currently  ?Other Topics Concern  ? Not on file  ?Social History Narrative  ? Not on file  ? ?Social Determinants of Health  ? ?Financial Resource Strain: Not on file  ?Food Insecurity: Not on file  ?Transportation Needs: Not on file  ?Physical Activity: Not on file  ?Stress: Not on file  ?Social Connections: Not on file  ? ?Additional Social History:  ?  ?  ?  ?  ?  ?  ?  ?  ?  ?  ?  ? ?Sleep: Good ? ?Appetite:  Good ? ?Current Medications: ?Current Facility-Administered Medications  ?Medication Dose Route Frequency Provider Last Rate Last Admin  ? acetaminophen (TYLENOL) tablet 650 mg  650 mg Oral Q6H PRN Caroline Sauger, NP   650 mg at 10/12/21 1031  ? alum & mag hydroxide-simeth (MAALOX/MYLANTA) 200-200-20 MG/5ML suspension 30 mL  30 mL Oral Q4H PRN Parks Ranger, DO   30 mL at 10/01/21 2215  ? cyclobenzaprine (FLEXERIL) tablet 10 mg  10 mg Oral QHS Parks Ranger, DO   10 mg at 10/27/21 2113  ? diltiazem (CARDIZEM CD) 24 hr capsule 240 mg  240 mg Oral Daily Parks Ranger, DO  240 mg at 10/28/21 0950  ? escitalopram (LEXAPRO) tablet 10 mg  10 mg Oral Daily Parks Ranger, DO   10 mg at 10/28/21 1610  ? feeding supplement (GLUCERNA SHAKE) (GLUCERNA SHAKE) liquid 237 mL  237 mL Oral TID BM Clapacs, John T, MD   237 mL at 10/28/21 1000  ? Glycerin (Adult) 2 g suppository 1 suppository  1 suppository Rectal Daily PRN Parks Ranger, DO   1 suppository at 10/19/21 1538  ? hydrALAZINE (APRESOLINE) tablet 50 mg  50 mg Oral TID Parks Ranger, DO   50 mg at 10/28/21 0830  ? insulin aspart (novoLOG) injection 4 Units  4 Units Subcutaneous TID WC Parks Ranger, DO   4 Units at 10/28/21 0830  ? linagliptin (TRADJENTA) tablet 5 mg  5 mg Oral Daily Parks Ranger, DO   5 mg at 10/28/21 9604  ? multivitamin (RENA-VIT) tablet 1 tablet  1 tablet Oral QHS Parks Ranger, DO   1 tablet at 10/27/21 2136  ?  OLANZapine (ZYPREXA) tablet 10 mg  10 mg Oral QHS Parks Ranger, DO   10 mg at 10/27/21 2112  ? ondansetron (ZOFRAN) tablet 4 mg  4 mg Oral Q6H PRN Parks Ranger, DO      ? Or  ? ondansetron (ZOFRAN) injection 4 mg  4 mg Intravenous Q6H PRN Parks Ranger, DO      ? polyethylene glycol (MIRALAX / GLYCOLAX) packet 17 g  17 g Oral QPC breakfast Parks Ranger, DO   17 g at 10/28/21 5409  ? senna-docusate (Senokot-S) tablet 1 tablet  1 tablet Oral QHS Parks Ranger, DO   1 tablet at 10/27/21 2112  ? sorbitol, milk of mag, mineral oil, glycerin (SMOG) enema  960 mL Rectal Daily PRN Parks Ranger, DO   960 mL at 10/19/21 2202  ? traZODone (DESYREL) tablet 100 mg  100 mg Oral QHS Parks Ranger, DO   100 mg at 10/27/21 2112  ? ? ?Lab Results:  ?Results for orders placed or performed during the hospital encounter of 09/29/21 (from the past 48 hour(s))  ?Glucose, capillary     Status: Abnormal  ? Collection Time: 10/26/21 11:50 AM  ?Result Value Ref Range  ? Glucose-Capillary 156 (H) 70 - 99 mg/dL  ?  Comment: Glucose reference range applies only to samples taken after fasting for at least 8 hours.  ?Glucose, capillary     Status: Abnormal  ? Collection Time: 10/26/21  4:27 PM  ?Result Value Ref Range  ? Glucose-Capillary 239 (H) 70 - 99 mg/dL  ?  Comment: Glucose reference range applies only to samples taken after fasting for at least 8 hours.  ?Glucose, capillary     Status: Abnormal  ? Collection Time: 10/26/21  8:13 PM  ?Result Value Ref Range  ? Glucose-Capillary 182 (H) 70 - 99 mg/dL  ?  Comment: Glucose reference range applies only to samples taken after fasting for at least 8 hours.  ? Comment 1 Notify RN   ?Glucose, capillary     Status: Abnormal  ? Collection Time: 10/27/21  8:27 AM  ?Result Value Ref Range  ? Glucose-Capillary 188 (H) 70 - 99 mg/dL  ?  Comment: Glucose reference range applies only to samples taken after fasting for at least 8  hours.  ?Glucose, capillary     Status: Abnormal  ? Collection Time: 10/27/21 11:45 AM  ?Result Value Ref Range  ? Glucose-Capillary  163 (H) 70 - 99 mg/dL  ?  Comment: Glucose reference range applies only to samples taken after fasting for at least 8 hours.  ?Glucose, capillary     Status: None  ? Collection Time: 10/27/21  5:00 PM  ?Result Value Ref Range  ? Glucose-Capillary 81 70 - 99 mg/dL  ?  Comment: Glucose reference range applies only to samples taken after fasting for at least 8 hours.  ?Glucose, capillary     Status: Abnormal  ? Collection Time: 10/27/21  8:58 PM  ?Result Value Ref Range  ? Glucose-Capillary 100 (H) 70 - 99 mg/dL  ?  Comment: Glucose reference range applies only to samples taken after fasting for at least 8 hours.  ?Glucose, capillary     Status: Abnormal  ? Collection Time: 10/28/21  8:04 AM  ?Result Value Ref Range  ? Glucose-Capillary 154 (H) 70 - 99 mg/dL  ?  Comment: Glucose reference range applies only to samples taken after fasting for at least 8 hours.  ? ? ?Blood Alcohol level:  ?Lab Results  ?Component Value Date  ? ETH <10 09/20/2021  ? ETH <10 10/13/2020  ? ? ?Metabolic Disorder Labs: ?Lab Results  ?Component Value Date  ? HGBA1C 8.0 (H) 09/27/2021  ? MPG 182.9 09/27/2021  ? MPG 180.03 10/13/2020  ? ?No results found for: PROLACTIN ?Lab Results  ?Component Value Date  ? CHOL 267 (H) 10/13/2020  ? TRIG 180 (H) 10/13/2020  ? HDL 86 10/13/2020  ? CHOLHDL 3.1 10/13/2020  ? VLDL 36 10/13/2020  ? LDLCALC 145 (H) 10/13/2020  ? LDLCALC 158 (H) 02/29/2020  ? ? ?Physical Findings: ?AIMS:  , ,  ,  ,    ?CIWA:    ?COWS:    ? ?Musculoskeletal: ?Strength & Muscle Tone: within normal limits ?Gait & Station: normal ?Patient leans: N/A ? ?Psychiatric Specialty Exam: ? ?Presentation  ?General Appearance: Bizarre; Disheveled ? ?Eye Contact:Fair ? ?Speech:Garbled ? ?Speech Volume:Decreased ? ?Handedness:Right ? ? ?Mood and Affect  ?Mood:No data recorded ?Affect:Flat ? ? ?Thought Process  ?Thought  Processes:Disorganized ? ?Descriptions of Associations:Loose ? ?Orientation:Partial ? ?Thought Content:Illogical ? ?History of Schizophrenia/Schizoaffective disorder:Yes ? ?Duration of Psychotic Symptoms:[place]

## 2021-10-28 NOTE — Progress Notes (Signed)
Recreation Therapy Notes ? ?  ?Date: 10/28/2021 ?  ?Time: 1:15pm  ?  ?Location: Craft room   ?  ?Behavioral response: N/A ?  ?Intervention Topic: Self-care ? ?Discussion/Intervention: ?Patient refused to attend group. ?  ?Clinical Observations/Feedback: ?Patient refused to attend group. ?  ?[name] LRT/CTRS ? ? ? ? ? ? ? ?Alicia Rogers ?10/28/2021 3:06 PM ?

## 2021-10-28 NOTE — BHH Counselor (Signed)
CSW spoke with pt regarding discharge planning.  ? ?She stated that she attempted to contact Robet Leu, case worker with [facility] without success.  ? ?She stated she talked with her sister discussing any potential housing options.  ? ?She stated that she has the number to her former care home in her phone and that her sister may also know the number.  ? ?CSW will reach out to the sister, Rocquel Askren, regarding the family care home information.  ? ?CSW will contact pending referrals for updates regarding admission status.  ? ?Alicia Rogers, MSW, LCSW-A ?3/29/20231:29 PM  ?

## 2021-10-29 DIAGNOSIS — F250 Schizoaffective disorder, bipolar type: Secondary | ICD-10-CM | POA: Diagnosis not present

## 2021-10-29 LAB — GLUCOSE, CAPILLARY
Glucose-Capillary: 167 mg/dL — ABNORMAL HIGH (ref 70–99)
Glucose-Capillary: 187 mg/dL — ABNORMAL HIGH (ref 70–99)
Glucose-Capillary: 153 mg/dL — ABNORMAL HIGH (ref 70–99)
Glucose-Capillary: 172 mg/dL — ABNORMAL HIGH (ref 70–99)

## 2021-10-29 NOTE — Plan of Care (Signed)
°  Problem: Group Participation °Goal: STG - Patient will engage in groups without prompting or encouragement from LRT x3 group sessions within 5 recreation therapy group sessions °Description: STG - Patient will engage in groups without prompting or encouragement from LRT x3 group sessions within 5 recreation therapy group sessions °Outcome: Progressing °  °

## 2021-10-29 NOTE — Plan of Care (Signed)
Patient remain alert and oriented, calm and cooperative. Denies SI, HI, AVH. Denies pain or discomfort. Denies anxiety or depression. Ate meals in the day room among peers. Compliant with due medications. Remain safe on the unit with Q15 minute safety check.  ? ?Problem: Education: ?Goal: Knowledge of [facility] Education information/materials will improve ?Outcome: Progressing ?Goal: Emotional status will improve ?Outcome: Progressing ?Goal: Mental status will improve ?Outcome: Progressing ?Goal: Verbalization of understanding the information provided will improve ?Outcome: Progressing ?  ?Problem: Activity: ?Goal: Interest or engagement in activities will improve ?Outcome: Progressing ?Goal: Sleeping patterns will improve ?Outcome: Progressing ?  ?Problem: Coping: ?Goal: Ability to verbalize frustrations and anger appropriately will improve ?Outcome: Progressing ?Goal: Ability to demonstrate self-control will improve ?Outcome: Progressing ?  ?Problem: Health Behavior/Discharge Planning: ?Goal: Identification of resources available to assist in meeting health care needs will improve ?Outcome: Progressing ?Goal: Compliance with treatment plan for underlying cause of condition will improve ?Outcome: Progressing ?  ?Problem: Physical Regulation: ?Goal: Ability to maintain clinical measurements within normal limits will improve ?Outcome: Progressing ?  ?Problem: Safety: ?Goal: Periods of time without injury will increase ?Outcome: Progressing ?  ?

## 2021-10-29 NOTE — Progress Notes (Signed)
Recreation Therapy Notes ? ?Date: 10/29/2021 ? ?Time: 1:15pm   ? ?Location: Craft room  ? ?Behavioral response: Appropriate ? ?Intervention Topic:  Goals   ? ?Discussion/Intervention:  ?Group content on today was focused on goals. Patients described what goals are and how they define goals. Individuals expressed how they go about setting goals and reaching them. The group identified how important goals are and if they make short term goals to reach long term goals. Patients described how many goals they work on at a time and what affects them not reaching their goal. Individuals described how much time they put into planning and obtaining their goals. The group participated in the intervention ?My Goal Board? and made personal goal boards to help them achieve their goal. ?Clinical Observations/Feedback: ?Patient came to group late and was focused on what peers and staff had to say about goals. Individual was social with peers and staff while participating in the intervention.    ?[name] LRT/CTRS  ? ? ? ? ? ? ? ?Alicia Rogers ?10/29/2021 3:08 PM ?

## 2021-10-29 NOTE — Progress Notes (Signed)
[facility] MD Progress Note ? ?10/29/2021 11:05 AM ?[place]  ?MRN:  [id number] ?Subjective:  Alicia Rogers is stable and doing well.  Social work continues to look for discharge planning.  She is taking her medications as prescribed and denies any side effects.  She has been pleasant and cooperative and interacting more with staff and peers.  She denies any problems right now. ? ?Principal Problem: Schizoaffective disorder, bipolar type ([place]) ?Diagnosis: Principal Problem: ?  Schizoaffective disorder, bipolar type ([place]) ? ?Total Time spent with patient: 15 minutes ? ?Past Psychiatric History: [facility] ? ?Past Medical History:  ?Past Medical History:  ?Diagnosis Date  ? Depression   ? Diabetes mellitus without complication ([place])   ? Diabetes mellitus, type II ([place])   ? Hypertension   ? Schizophrenia ([place])   ? History reviewed. No pertinent surgical history. ?Family History:  ?Family History  ?Problem Relation Age of Onset  ? Diabetes Mother   ? Glaucoma Mother   ? Heart disease Mother   ? Heart attack Mother   ? Depression Mother   ? Hypertension Mother   ? Diabetes Father   ? Glaucoma Father   ? Hypertension Father   ? Diabetes Sister   ? Depression Sister   ? Post-traumatic stress disorder Sister   ? ? ?Social History:  ?Social History  ? ?Substance and Sexual Activity  ?Alcohol Use No  ? Alcohol/week: 0.0 standard drinks  ?   ?Social History  ? ?Substance and Sexual Activity  ?Drug Use No  ?  ?Social History  ? ?Socioeconomic History  ? Marital status: Single  ?  Spouse name: Not on file  ? Number of children: Not on file  ? Years of education: Not on file  ? Highest education level: Not on file  ?Occupational History  ? Not on file  ?Tobacco Use  ? Smoking status: Every Day  ?  Packs/day: 1.00  ?  Types: Cigarettes  ?  Start date: 04/21/1978  ? Smokeless tobacco: Never  ?Vaping Use  ? Vaping Use: Never used  ?Substance and Sexual Activity  ? Alcohol use: No  ?  Alcohol/week: 0.0 standard drinks  ?  Drug use: No  ? Sexual activity: Not Currently  ?Other Topics Concern  ? Not on file  ?Social History Narrative  ? Not on file  ? ?Social Determinants of Health  ? ?Financial Resource Strain: Not on file  ?Food Insecurity: Not on file  ?Transportation Needs: Not on file  ?Physical Activity: Not on file  ?Stress: Not on file  ?Social Connections: Not on file  ? ?Additional Social History:  ?  ?  ?  ?  ?  ?  ?  ?  ?  ?  ?  ? ?Sleep: Good ? ?Appetite:  Good ? ?Current Medications: ?Current Facility-Administered Medications  ?Medication Dose Route Frequency Provider Last Rate Last Admin  ? acetaminophen (TYLENOL) tablet 650 mg  650 mg Oral Q6H PRN Caroline Sauger, NP   650 mg at 10/12/21 1031  ? alum & mag hydroxide-simeth (MAALOX/MYLANTA) 200-200-20 MG/5ML suspension 30 mL  30 mL Oral Q4H PRN Parks Ranger, DO   30 mL at 10/01/21 2215  ? cyclobenzaprine (FLEXERIL) tablet 10 mg  10 mg Oral QHS Parks Ranger, DO   10 mg at 10/28/21 2051  ? diltiazem (CARDIZEM CD) 24 hr capsule 240 mg  240 mg Oral Daily Parks Ranger, DO   240 mg at 10/29/21 1007  ? escitalopram (  LEXAPRO) tablet 10 mg  10 mg Oral Daily Parks Ranger, DO   10 mg at 10/29/21 0258  ? feeding supplement (GLUCERNA SHAKE) (GLUCERNA SHAKE) liquid 237 mL  237 mL Oral TID BM Clapacs, John T, MD   237 mL at 10/29/21 1008  ? Glycerin (Adult) 2 g suppository 1 suppository  1 suppository Rectal Daily PRN Parks Ranger, DO   1 suppository at 10/19/21 1538  ? hydrALAZINE (APRESOLINE) tablet 50 mg  50 mg Oral TID Parks Ranger, DO   50 mg at 10/29/21 5277  ? insulin aspart (novoLOG) injection 4 Units  4 Units Subcutaneous TID WC Parks Ranger, DO   4 Units at 10/29/21 8242  ? linagliptin (TRADJENTA) tablet 5 mg  5 mg Oral Daily Parks Ranger, DO   5 mg at 10/29/21 1008  ? multivitamin (RENA-VIT) tablet 1 tablet  1 tablet Oral QHS Parks Ranger, DO   1 tablet at 10/28/21 2052  ?  OLANZapine (ZYPREXA) tablet 10 mg  10 mg Oral QHS Parks Ranger, DO   10 mg at 10/28/21 2049  ? ondansetron (ZOFRAN) tablet 4 mg  4 mg Oral Q6H PRN Parks Ranger, DO      ? Or  ? ondansetron (ZOFRAN) injection 4 mg  4 mg Intravenous Q6H PRN Parks Ranger, DO      ? polyethylene glycol (MIRALAX / GLYCOLAX) packet 17 g  17 g Oral QPC breakfast Parks Ranger, DO   17 g at 10/29/21 3536  ? senna-docusate (Senokot-S) tablet 1 tablet  1 tablet Oral QHS Parks Ranger, DO   1 tablet at 10/28/21 2051  ? sorbitol, milk of mag, mineral oil, glycerin (SMOG) enema  960 mL Rectal Daily PRN Parks Ranger, DO   960 mL at 10/19/21 2202  ? traZODone (DESYREL) tablet 100 mg  100 mg Oral QHS Parks Ranger, DO   100 mg at 10/28/21 2049  ? ? ?Lab Results:  ?Results for orders placed or performed during the hospital encounter of 09/29/21 (from the past 48 hour(s))  ?Glucose, capillary     Status: Abnormal  ? Collection Time: 10/27/21 11:45 AM  ?Result Value Ref Range  ? Glucose-Capillary 163 (H) 70 - 99 mg/dL  ?  Comment: Glucose reference range applies only to samples taken after fasting for at least 8 hours.  ?Glucose, capillary     Status: None  ? Collection Time: 10/27/21  5:00 PM  ?Result Value Ref Range  ? Glucose-Capillary 81 70 - 99 mg/dL  ?  Comment: Glucose reference range applies only to samples taken after fasting for at least 8 hours.  ?Glucose, capillary     Status: Abnormal  ? Collection Time: 10/27/21  8:58 PM  ?Result Value Ref Range  ? Glucose-Capillary 100 (H) 70 - 99 mg/dL  ?  Comment: Glucose reference range applies only to samples taken after fasting for at least 8 hours.  ?Glucose, capillary     Status: Abnormal  ? Collection Time: 10/28/21  8:04 AM  ?Result Value Ref Range  ? Glucose-Capillary 154 (H) 70 - 99 mg/dL  ?  Comment: Glucose reference range applies only to samples taken after fasting for at least 8 hours.  ?Glucose, capillary      Status: Abnormal  ? Collection Time: 10/28/21 11:51 AM  ?Result Value Ref Range  ? Glucose-Capillary 177 (H) 70 - 99 mg/dL  ?  Comment: Glucose reference range applies only to  samples taken after fasting for at least 8 hours.  ?Glucose, capillary     Status: Abnormal  ? Collection Time: 10/28/21  4:13 PM  ?Result Value Ref Range  ? Glucose-Capillary 182 (H) 70 - 99 mg/dL  ?  Comment: Glucose reference range applies only to samples taken after fasting for at least 8 hours.  ?Glucose, capillary     Status: Abnormal  ? Collection Time: 10/28/21  9:26 PM  ?Result Value Ref Range  ? Glucose-Capillary 275 (H) 70 - 99 mg/dL  ?  Comment: Glucose reference range applies only to samples taken after fasting for at least 8 hours.  ? Comment 1 Notify RN   ?Glucose, capillary     Status: Abnormal  ? Collection Time: 10/29/21  8:13 AM  ?Result Value Ref Range  ? Glucose-Capillary 167 (H) 70 - 99 mg/dL  ?  Comment: Glucose reference range applies only to samples taken after fasting for at least 8 hours.  ? ? ?Blood Alcohol level:  ?Lab Results  ?Component Value Date  ? ETH <10 09/20/2021  ? ETH <10 10/13/2020  ? ? ?Metabolic Disorder Labs: ?Lab Results  ?Component Value Date  ? HGBA1C 8.0 (H) 09/27/2021  ? MPG 182.9 09/27/2021  ? MPG 180.03 10/13/2020  ? ?No results found for: PROLACTIN ?Lab Results  ?Component Value Date  ? CHOL 267 (H) 10/13/2020  ? TRIG 180 (H) 10/13/2020  ? HDL 86 10/13/2020  ? CHOLHDL 3.1 10/13/2020  ? VLDL 36 10/13/2020  ? LDLCALC 145 (H) 10/13/2020  ? LDLCALC 158 (H) 02/29/2020  ? ? ?Physical Findings: ?AIMS:  , ,  ,  ,    ?CIWA:    ?COWS:    ? ?Musculoskeletal: ?Strength & Muscle Tone: within normal limits ?Gait & Station: normal ?Patient leans: N/A ? ?Psychiatric Specialty Exam: ? ?Presentation  ?General Appearance: Bizarre; Disheveled ? ?Eye Contact:Fair ? ?Speech:Garbled ? ?Speech Volume:Decreased ? ?Handedness:Right ? ? ?Mood and Affect  ?Mood:No data recorded ?Affect:Flat ? ? ?Thought Process  ?Thought  Processes:Disorganized ? ?Descriptions of Associations:Loose ? ?Orientation:Partial ? ?Thought Content:Illogical ? ?History of Schizophrenia/Schizoaffective disorder:Yes ? ?Duration of Psychotic Symptoms

## 2021-10-29 NOTE — BHH Counselor (Signed)
CSW contacted [employer], [email or web address], [phone], regarding referral for pt.  ? ?She stated that she faxed out referral to a sight that has an opening and that she would follow up with any information regarding referral.  ? ?She also stated that she had referral openings for female beds. CSW will follow up with referral for potential patients.  ? ?Callan Yontz [place], MSW, LCSW-A ?3/30/20234:14 PM  ?

## 2021-10-29 NOTE — BHH Counselor (Signed)
CSW contacted  Shirlee Limerick  [phone], admissions at [facility] regarding pt referral. CSW received no answer. CSW left confidential voicemail. CSW will follow up at another time .  ? ?Dougles Kimmey [place], MSW, LCSW-A ?3/30/20234:23 PM  ?

## 2021-10-29 NOTE — Progress Notes (Signed)
Patient  is alert and oriented X 3. Patient has soft and delayed speech, require assistance with ADL's. Ambulates with a slow and unsteady gait. Patient denies pain or discomfort at this time. Denies SI, HI, and AVH. Patient is currently in bed and in no apparent distress. Will continue to monitor patient and update as needed.  ?

## 2021-10-29 NOTE — BHH Counselor (Signed)
CSW contacted pt's new case worker at [facility], [place], [phone].  ? ?She stated that she is pt's new case worker and needs to visit pt on the unit. She stated that she would be around to visit at 10am tomorrow. CSW stated that would be fine and inform pt of meeting if pt is ok with talking to case worker.  ? ?No other requests made. Conversation ended without incident.  ? ?Euleta Belson [place], MSW, LCSW-A ?3/30/20234:27 PM  ?

## 2021-10-29 NOTE — BHH Group Notes (Signed)
[facility] Group Notes:  (Nursing/MHT/Case Management/Adjunct) ? ?Date:  10/29/2021  ?Time:  10:00 AM ? ?Type of Therapy:  Psychoeducational Skills ? ?Participation Level:  Did Not Attend ? ?Participation Quality:   N/A ? ?Affect:   N/A ? ?Cognitive:   N/A ? ?Insight:  None ? ?Engagement in Group:   N/A ? ?Modes of Intervention:   N/A ? ?Summary of Progress/Problems: ?The pt did not attend group. Pt remained in bed sleep. ?Alicia Rogers ?10/29/2021, 10:38 AM ?

## 2021-10-30 DIAGNOSIS — F250 Schizoaffective disorder, bipolar type: Secondary | ICD-10-CM | POA: Diagnosis not present

## 2021-10-30 LAB — GLUCOSE, CAPILLARY
Glucose-Capillary: 143 mg/dL — ABNORMAL HIGH (ref 70–99)
Glucose-Capillary: 139 mg/dL — ABNORMAL HIGH (ref 70–99)
Glucose-Capillary: 111 mg/dL — ABNORMAL HIGH (ref 70–99)
Glucose-Capillary: 109 mg/dL — ABNORMAL HIGH (ref 70–99)

## 2021-10-30 NOTE — Progress Notes (Signed)
Recreation Therapy Notes ? ?Date: 10/30/2021  ?  ?Time: 1:15pm   ?  ?Location: Craft room    ?  ?Behavioral response: Appropriate ?  ?Intervention Topic:  Social Skills  ?  ?Discussion/Intervention:  ?Group content on today was focused on social skills. The group defined social skills and identified ways they use social skills. Patients expressed what obstacles they face when trying to be social. Participants described the importance of social skills. The group listed ways to improve social skills and reasons to improve social skills. Individuals had an opportunity to learn new and improve social skills as well as identify their weaknesses. ?  ?Clinical Observations/Feedback: ?Patient came to group and defined social skills as praying and relying on strengths. Individual was social with peers and staff while participating in the intervention.   ?[name] LRT/CTRS  ? ? ? ? ? ? ? ?Alicia Rogers ?10/30/2021 3:28 PM ?

## 2021-10-30 NOTE — Progress Notes (Signed)
Pt standing in hallway, near pt phones; calm, cooperative. Pt states "I was feeling a little dizzy but I'm alright now." She currently denies pain, SI/HI/AVH, anxiety and depression. She says "I was hearing voices but I have them under control"; she states that she last heard voices earlier today. She describes her sleep as "pretty good" and reports "I lost my appetite yesterday, but it's starting to build back up." No acute distress noted. ?

## 2021-10-30 NOTE — Progress Notes (Signed)
[facility] MD Progress Note ? ?10/30/2021 11:12 AM ?Alicia Rogers  ?MRN:  [id number] ?Subjective:  Alicia Rogers has been stable. She is compliant with her medications. No side effects. No hallucinations. Pleasant and cooperative.  Social work continues to work with the county to find placement for her. ? ?Principal Problem: Schizoaffective disorder, bipolar type ([place]) ?Diagnosis: Principal Problem: ?  Schizoaffective disorder, bipolar type ([name]) ? ?Total Time spent with patient: 15 minutes ? ?Past Psychiatric History: [facility] ? ?Past Medical History:  ?Past Medical History:  ?Diagnosis Date  ? Depression   ? Diabetes mellitus without complication ([place])   ? Diabetes mellitus, type II ([place])   ? Hypertension   ? Schizophrenia ([place])   ? History reviewed. No pertinent surgical history. ?Family History:  ?Family History  ?Problem Relation Age of Onset  ? Diabetes Mother   ? Glaucoma Mother   ? Heart disease Mother   ? Heart attack Mother   ? Depression Mother   ? Hypertension Mother   ? Diabetes Father   ? Glaucoma Father   ? Hypertension Father   ? Diabetes Sister   ? Depression Sister   ? Post-traumatic stress disorder Sister   ? ? ?Social History:  ?Social History  ? ?Substance and Sexual Activity  ?Alcohol Use No  ? Alcohol/week: 0.0 standard drinks  ?   ?Social History  ? ?Substance and Sexual Activity  ?Drug Use No  ?  ?Social History  ? ?Socioeconomic History  ? Marital status: Single  ?  Spouse name: Not on file  ? Number of children: Not on file  ? Years of education: Not on file  ? Highest education level: Not on file  ?Occupational History  ? Not on file  ?Tobacco Use  ? Smoking status: Every Day  ?  Packs/day: 1.00  ?  Types: Cigarettes  ?  Start date: 04/21/1978  ? Smokeless tobacco: Never  ?Vaping Use  ? Vaping Use: Never used  ?Substance and Sexual Activity  ? Alcohol use: No  ?  Alcohol/week: 0.0 standard drinks  ? Drug use: No  ? Sexual activity: Not Currently  ?Other Topics Concern  ? Not on  file  ?Social History Narrative  ? Not on file  ? ?Social Determinants of Health  ? ?Financial Resource Strain: Not on file  ?Food Insecurity: Not on file  ?Transportation Needs: Not on file  ?Physical Activity: Not on file  ?Stress: Not on file  ?Social Connections: Not on file  ? ?Additional Social History:  ?  ?  ?  ?  ?  ?  ?  ?  ?  ?  ?  ? ?Sleep: Good ? ?Appetite:  Good ? ?Current Medications: ?Current Facility-Administered Medications  ?Medication Dose Route Frequency Provider Last Rate Last Admin  ? acetaminophen (TYLENOL) tablet 650 mg  650 mg Oral Q6H PRN Caroline Sauger, NP   650 mg at 10/12/21 1031  ? alum & mag hydroxide-simeth (MAALOX/MYLANTA) 200-200-20 MG/5ML suspension 30 mL  30 mL Oral Q4H PRN Parks Ranger, DO   30 mL at 10/01/21 2215  ? cyclobenzaprine (FLEXERIL) tablet 10 mg  10 mg Oral QHS Parks Ranger, DO   10 mg at 10/29/21 2145  ? diltiazem (CARDIZEM CD) 24 hr capsule 240 mg  240 mg Oral Daily Parks Ranger, DO   240 mg at 10/30/21 0998  ? escitalopram (LEXAPRO) tablet 10 mg  10 mg Oral Daily Parks Ranger, DO   10 mg  at 10/30/21 0909  ? feeding supplement (GLUCERNA SHAKE) (GLUCERNA SHAKE) liquid 237 mL  237 mL Oral TID BM Clapacs, Madie Reno, MD   237 mL at 10/30/21 0911  ? Glycerin (Adult) 2 g suppository 1 suppository  1 suppository Rectal Daily PRN Parks Ranger, DO   1 suppository at 10/19/21 1538  ? hydrALAZINE (APRESOLINE) tablet 50 mg  50 mg Oral TID Parks Ranger, DO   50 mg at 10/30/21 2725  ? insulin aspart (novoLOG) injection 4 Units  4 Units Subcutaneous TID WC Parks Ranger, DO   4 Units at 10/30/21 3664  ? linagliptin (TRADJENTA) tablet 5 mg  5 mg Oral Daily Parks Ranger, DO   5 mg at 10/30/21 4034  ? multivitamin (RENA-VIT) tablet 1 tablet  1 tablet Oral QHS Parks Ranger, DO   1 tablet at 10/29/21 2146  ? OLANZapine (ZYPREXA) tablet 10 mg  10 mg Oral QHS Parks Ranger, DO    10 mg at 10/29/21 2144  ? ondansetron (ZOFRAN) tablet 4 mg  4 mg Oral Q6H PRN Parks Ranger, DO      ? Or  ? ondansetron (ZOFRAN) injection 4 mg  4 mg Intravenous Q6H PRN Parks Ranger, DO      ? polyethylene glycol (MIRALAX / GLYCOLAX) packet 17 g  17 g Oral QPC breakfast Parks Ranger, DO   17 g at 10/30/21 7425  ? senna-docusate (Senokot-S) tablet 1 tablet  1 tablet Oral QHS Parks Ranger, DO   1 tablet at 10/29/21 2144  ? sorbitol, milk of mag, mineral oil, glycerin (SMOG) enema  960 mL Rectal Daily PRN Parks Ranger, DO   960 mL at 10/19/21 2202  ? traZODone (DESYREL) tablet 100 mg  100 mg Oral QHS Parks Ranger, DO   100 mg at 10/29/21 2144  ? ? ?Lab Results:  ?Results for orders placed or performed during the hospital encounter of 09/29/21 (from the past 48 hour(s))  ?Glucose, capillary     Status: Abnormal  ? Collection Time: 10/28/21 11:51 AM  ?Result Value Ref Range  ? Glucose-Capillary 177 (H) 70 - 99 mg/dL  ?  Comment: Glucose reference range applies only to samples taken after fasting for at least 8 hours.  ?Glucose, capillary     Status: Abnormal  ? Collection Time: 10/28/21  4:13 PM  ?Result Value Ref Range  ? Glucose-Capillary 182 (H) 70 - 99 mg/dL  ?  Comment: Glucose reference range applies only to samples taken after fasting for at least 8 hours.  ?Glucose, capillary     Status: Abnormal  ? Collection Time: 10/28/21  9:26 PM  ?Result Value Ref Range  ? Glucose-Capillary 275 (H) 70 - 99 mg/dL  ?  Comment: Glucose reference range applies only to samples taken after fasting for at least 8 hours.  ? Comment 1 Notify RN   ?Glucose, capillary     Status: Abnormal  ? Collection Time: 10/29/21  8:13 AM  ?Result Value Ref Range  ? Glucose-Capillary 167 (H) 70 - 99 mg/dL  ?  Comment: Glucose reference range applies only to samples taken after fasting for at least 8 hours.  ?Glucose, capillary     Status: Abnormal  ? Collection Time: 10/29/21 11:45  AM  ?Result Value Ref Range  ? Glucose-Capillary 187 (H) 70 - 99 mg/dL  ?  Comment: Glucose reference range applies only to samples taken after fasting for at least 8 hours.  ?  Glucose, capillary     Status: Abnormal  ? Collection Time: 10/29/21  4:38 PM  ?Result Value Ref Range  ? Glucose-Capillary 153 (H) 70 - 99 mg/dL  ?  Comment: Glucose reference range applies only to samples taken after fasting for at least 8 hours.  ?Glucose, capillary     Status: Abnormal  ? Collection Time: 10/29/21  9:51 PM  ?Result Value Ref Range  ? Glucose-Capillary 172 (H) 70 - 99 mg/dL  ?  Comment: Glucose reference range applies only to samples taken after fasting for at least 8 hours.  ?Glucose, capillary     Status: Abnormal  ? Collection Time: 10/30/21  7:53 AM  ?Result Value Ref Range  ? Glucose-Capillary 143 (H) 70 - 99 mg/dL  ?  Comment: Glucose reference range applies only to samples taken after fasting for at least 8 hours.  ? ? ?Blood Alcohol level:  ?Lab Results  ?Component Value Date  ? ETH <10 09/20/2021  ? ETH <10 10/13/2020  ? ? ?Metabolic Disorder Labs: ?Lab Results  ?Component Value Date  ? HGBA1C 8.0 (H) 09/27/2021  ? MPG 182.9 09/27/2021  ? MPG 180.03 10/13/2020  ? ?No results found for: PROLACTIN ?Lab Results  ?Component Value Date  ? CHOL 267 (H) 10/13/2020  ? TRIG 180 (H) 10/13/2020  ? HDL 86 10/13/2020  ? CHOLHDL 3.1 10/13/2020  ? VLDL 36 10/13/2020  ? LDLCALC 145 (H) 10/13/2020  ? LDLCALC 158 (H) 02/29/2020  ? ? ?Physical Findings: ?AIMS:  , ,  ,  ,    ?CIWA:    ?COWS:    ? ?Musculoskeletal: ?Strength & Muscle Tone: within normal limits ?Gait & Station: normal ?Patient leans: N/A ? ?Psychiatric Specialty Exam: ? ?Presentation  ?General Appearance: Bizarre; Disheveled ? ?Eye Contact:Fair ? ?Speech:Garbled ? ?Speech Volume:Decreased ? ?Handedness:Right ? ? ?Mood and Affect  ?Mood:No data recorded ?Affect:Flat ? ? ?Thought Process  ?Thought Processes:Disorganized ? ?Descriptions of  Associations:Loose ? ?Orientation:Partial ? ?Thought Content:Illogical ? ?History of Schizophrenia/Schizoaffective disorder:Yes ? ?Duration of Psychotic Symptoms:Greater than six months ? ?Hallucinations:No data recorded ?Ideas of Re

## 2021-10-30 NOTE — Plan of Care (Signed)
Patient presents A&O x4 with flat and blunted affect.  Speech soft with delayed response.  Denies AVH, SI, HI or pain.  Compliant with all evening meds.  Patient spent most of the evening in day room working on crossword.  Ambulating independent with steady gait.  Denies needs or complaints.  Will continue to monitor with Q 15 min safey checks ? ?Problem: Education: ?Goal: Knowledge of [facility] information/materials will improve ?Outcome: Progressing ?Goal: Emotional status will improve ?Outcome: Progressing ?Goal: Mental status will improve ?Outcome: Progressing ?Goal: Verbalization of understanding the information provided will improve ?Outcome: Progressing ?  ?Problem: Activity: ?Goal: Sleeping patterns will improve ?Outcome: Progressing ?  ?Problem: Health Behavior/Discharge Planning: ?Goal: Compliance with treatment plan for underlying cause of condition will improve ?Outcome: Progressing ?  ?

## 2021-10-30 NOTE — BHH Counselor (Signed)
Pt met with case worker, Lora Havens, on [employer], who is pt's new Case worker at [employer]. ? ?She stated that pt would be getting a treatment social worker assigned to her and would assist with finding permanent housing.  ? ?CSW provided collateral information regarding pt admission.  ? ?She stated that she would follow up with CSW next week with any updates on pt's case.  ? ?No other requests were made. Conversation ended without incident.  ? ?Unnamed Hino [place], MSW, LCSW-A ?3/31/202311:48 AM  ?

## 2021-10-30 NOTE — BHH Counselor (Signed)
CSW was contacted by [facility] [place], to provide additional medical information on pt for potential admissions to [facility].  ? ?She stated that she would follow up with pt on Monday regarding status and potential to set up meetings for staff at pending [facility] care home to meet with pt.  ? ?No other requests made. Conversation ended without incident.  ? ?Bridgit Eynon [place], MSW, LCSW-A ?3/31/202311:45 AM  ?

## 2021-10-30 NOTE — Plan of Care (Signed)
Patient remain alert and oriented X2, calm and compliant during assessment. Patient denies SI, HI, AVH. Denies pain or discomfort. Denies anxiety and depression. Ate breakfast in the day room among peers. Compliant with all due medications. Patient had one episode of urine incontinence. Incontinent care provided, assisted with bath and pericare. Clean scrubs provided. Complete linen change. Patient remain safe on the unit with Q15 minute safety check.  ?Problem: Education: ?Goal: Knowledge of [facility] Education information/materials will improve ?Outcome: Progressing ?Goal: Emotional status will improve ?Outcome: Progressing ?Goal: Mental status will improve ?Outcome: Progressing ?Goal: Verbalization of understanding the information provided will improve ?Outcome: Progressing ?  ?Problem: Activity: ?Goal: Interest or engagement in activities will improve ?Outcome: Progressing ?Goal: Sleeping patterns will improve ?Outcome: Progressing ?  ?Problem: Coping: ?Goal: Ability to verbalize frustrations and anger appropriately will improve ?Outcome: Progressing ?Goal: Ability to demonstrate self-control will improve ?Outcome: Progressing ?  ?Problem: Health Behavior/Discharge Planning: ?Goal: Identification of resources available to assist in meeting health care needs will improve ?Outcome: Progressing ?Goal: Compliance with treatment plan for underlying cause of condition will improve ?Outcome: Progressing ?  ?Problem: Physical Regulation: ?Goal: Ability to maintain clinical measurements within normal limits will improve ?Outcome: Progressing ?  ?Problem: Safety: ?Goal: Periods of time without injury will increase ?Outcome: Progressing ?  ?

## 2021-10-30 NOTE — BH IP Treatment Plan (Signed)
Interdisciplinary Treatment and Diagnostic Plan Update ? ?10/30/2021 ?Time of Session: 9:30AM ?[place] ?MRN: [id number] ? ?Principal Diagnosis: Schizoaffective disorder, bipolar type ([place]) ? ?Secondary Diagnoses: Principal Problem: ?  Schizoaffective disorder, bipolar type ([place]) ? ? ?Current Medications:  ?Current Facility-Administered Medications  ?Medication Dose Route Frequency Provider Last Rate Last Admin  ? acetaminophen (TYLENOL) tablet 650 mg  650 mg Oral Q6H PRN Caroline Sauger, NP   650 mg at 10/12/21 1031  ? alum & mag hydroxide-simeth (MAALOX/MYLANTA) 200-200-20 MG/5ML suspension 30 mL  30 mL Oral Q4H PRN Parks Ranger, DO   30 mL at 10/01/21 2215  ? cyclobenzaprine (FLEXERIL) tablet 10 mg  10 mg Oral QHS Parks Ranger, DO   10 mg at 10/29/21 2145  ? diltiazem (CARDIZEM CD) 24 hr capsule 240 mg  240 mg Oral Daily Parks Ranger, DO   240 mg at 10/30/21 7062  ? escitalopram (LEXAPRO) tablet 10 mg  10 mg Oral Daily Parks Ranger, DO   10 mg at 10/30/21 3762  ? feeding supplement (GLUCERNA SHAKE) (GLUCERNA SHAKE) liquid 237 mL  237 mL Oral TID BM Clapacs, Madie Reno, MD   237 mL at 10/30/21 0911  ? Glycerin (Adult) 2 g suppository 1 suppository  1 suppository Rectal Daily PRN Parks Ranger, DO   1 suppository at 10/19/21 1538  ? hydrALAZINE (APRESOLINE) tablet 50 mg  50 mg Oral TID Parks Ranger, DO   50 mg at 10/30/21 8315  ? insulin aspart (novoLOG) injection 4 Units  4 Units Subcutaneous TID WC Parks Ranger, DO   4 Units at 10/30/21 1761  ? linagliptin (TRADJENTA) tablet 5 mg  5 mg Oral Daily Parks Ranger, DO   5 mg at 10/30/21 6073  ? multivitamin (RENA-VIT) tablet 1 tablet  1 tablet Oral QHS Parks Ranger, DO   1 tablet at 10/29/21 2146  ? OLANZapine (ZYPREXA) tablet 10 mg  10 mg Oral QHS Parks Ranger, DO   10 mg at 10/29/21 2144  ? ondansetron (ZOFRAN) tablet 4 mg  4 mg Oral Q6H PRN Parks Ranger, DO      ? Or  ? ondansetron (ZOFRAN) injection 4 mg  4 mg Intravenous Q6H PRN Parks Ranger, DO      ? polyethylene glycol (MIRALAX / GLYCOLAX) packet 17 g  17 g Oral QPC breakfast Parks Ranger, DO   17 g at 10/30/21 7106  ? senna-docusate (Senokot-S) tablet 1 tablet  1 tablet Oral QHS Parks Ranger, DO   1 tablet at 10/29/21 2144  ? sorbitol, milk of mag, mineral oil, glycerin (SMOG) enema  960 mL Rectal Daily PRN Parks Ranger, DO   960 mL at 10/19/21 2202  ? traZODone (DESYREL) tablet 100 mg  100 mg Oral QHS Parks Ranger, DO   100 mg at 10/29/21 2144  ? ?PTA Medications: ?Medications Prior to Admission  ?Medication Sig Dispense Refill Last Dose  ? amLODipine (NORVASC) 10 MG tablet Take 1 tablet (10 mg total) by mouth daily.     ? [EXPIRED] cefdinir (OMNICEF) 300 MG capsule Take 1 capsule (300 mg total) by mouth daily for 3 days. 3 capsule 0   ? hydrALAZINE (APRESOLINE) 50 MG tablet Take 1 tablet (50 mg total) by mouth 3 (three) times daily.     ? hydrochlorothiazide (HYDRODIURIL) 12.5 MG tablet Take 1 tablet (12.5 mg total) by mouth daily.     ? ? ?Patient  Stressors: Health problems   ? ?Patient Strengths: Religious Affiliation  ? ?Treatment Modalities: Medication Management, Group therapy, Case management,  ?1 to 1 session with clinician, Psychoeducation, Recreational therapy. ? ? ?Physician Treatment Plan for Primary Diagnosis: Schizoaffective disorder, bipolar type ([place]) ?Long Term Goal(s): Improvement in symptoms so as ready for discharge  ? ?Short Term Goals: Ability to identify changes in lifestyle to reduce recurrence of condition will improve ?Ability to verbalize feelings will improve ?Ability to disclose and discuss suicidal ideas ?Ability to demonstrate self-control will improve ?Ability to identify and develop effective coping behaviors will improve ?Ability to maintain clinical measurements within normal limits will  improve ?Compliance with prescribed medications will improve ?Ability to identify triggers associated with substance abuse/mental health issues will improve ? ?Medication Management: Evaluate patient's response, side effects, and tolerance of medication regimen. ? ?Therapeutic Interventions: 1 to 1 sessions, Unit Group sessions and Medication administration. ? ?Evaluation of Outcomes: Progressing ? ?Physician Treatment Plan for Secondary Diagnosis: Principal Problem: ?  Schizoaffective disorder, bipolar type ([place]) ? ?Long Term Goal(s): Improvement in symptoms so as ready for discharge  ? ?Short Term Goals: Ability to identify changes in lifestyle to reduce recurrence of condition will improve ?Ability to verbalize feelings will improve ?Ability to disclose and discuss suicidal ideas ?Ability to demonstrate self-control will improve ?Ability to identify and develop effective coping behaviors will improve ?Ability to maintain clinical measurements within normal limits will improve ?Compliance with prescribed medications will improve ?Ability to identify triggers associated with substance abuse/mental health issues will improve    ? ?Medication Management: Evaluate patient's response, side effects, and tolerance of medication regimen. ? ?Therapeutic Interventions: 1 to 1 sessions, Unit Group sessions and Medication administration. ? ?Evaluation of Outcomes: Progressing ? ? ?RN Treatment Plan for Primary Diagnosis: Schizoaffective disorder, bipolar type ([place]) ?Long Term Goal(s): Knowledge of disease and therapeutic regimen to maintain health will improve ? ?Short Term Goals: Ability to remain free from injury will improve, Ability to verbalize frustration and anger appropriately will improve, Ability to demonstrate self-control, Ability to participate in decision making will improve, Ability to verbalize feelings will improve, Ability to identify and develop effective coping behaviors will improve, and Compliance with  prescribed medications will improve ? ?Medication Management: RN will administer medications as ordered by provider, will assess and evaluate patient's response and provide education to patient for prescribed medication. RN will report any adverse and/or side effects to prescribing provider. ? ?Therapeutic Interventions: 1 on 1 counseling sessions, Psychoeducation, Medication administration, Evaluate responses to treatment, Monitor vital signs and CBGs as ordered, Perform/monitor CIWA, COWS, AIMS and Fall Risk screenings as ordered, Perform wound care treatments as ordered. ? ?Evaluation of Outcomes: Progressing ? ? ?LCSW Treatment Plan for Primary Diagnosis: Schizoaffective disorder, bipolar type ([place]) ?Long Term Goal(s): Safe transition to appropriate next level of care at discharge, Engage patient in therapeutic group addressing interpersonal concerns. ? ?Short Term Goals: Engage patient in aftercare planning with referrals and resources, Increase social support, Increase ability to appropriately verbalize feelings, Increase emotional regulation, Facilitate acceptance of mental health diagnosis and concerns, Identify triggers associated with mental health/substance abuse issues, and Increase skills for wellness and recovery ? ?Therapeutic Interventions: Assess for all discharge needs, 1 to 1 time with Education officer, museum, Explore available resources and support systems, Assess for adequacy in community support network, Educate family and significant other(s) on suicide prevention, Complete Psychosocial Assessment, Interpersonal group therapy. ? ?Evaluation of Outcomes: Progressing ? ? ?Progress in Treatment: ?Attending groups: Yes. ?Participating in  groups: Yes. ?Taking medication as prescribed: Yes. ?Toleration medication: Yes. ?Family/Significant other contact made: Yes, individual(s) contacted:  pt's sister, [name] Johann SPE completed ?Patient understands diagnosis: Yes. ?Discussing patient identified problems/goals  with staff: Yes. ?Medical problems stabilized or resolved: Yes. ?Denies suicidal/homicidal ideation: Yes. ?Issues/concerns per patient self-inventory: No. ?Other: None ? ?New problem(s) identified: No, Describe:  None

## 2021-10-31 LAB — GLUCOSE, CAPILLARY
Glucose-Capillary: 128 mg/dL — ABNORMAL HIGH (ref 70–99)
Glucose-Capillary: 253 mg/dL — ABNORMAL HIGH (ref 70–99)
Glucose-Capillary: 158 mg/dL — ABNORMAL HIGH (ref 70–99)

## 2021-10-31 NOTE — Group Note (Signed)
LCSW Group Therapy Note ? ?Group Date: 10/31/2021 ?Start Time: 1300 ?End Time: 1400 ? ? ?Type of Therapy and Topic:  Group Therapy - Healthy vs Unhealthy Coping Skills ? ?Participation Level:  Did Not Attend  ? ?Description of Group ?The focus of this group was to determine what unhealthy coping techniques typically are used by group members and what healthy coping techniques would be helpful in coping with various problems. Patients were guided in becoming aware of the differences between healthy and unhealthy coping techniques. Patients were asked to identify 2-3 healthy coping skills they would like to learn to use more effectively. ? ?Therapeutic Goals ?Patients learned that coping is what human beings do all day long to deal with various situations in their lives ?Patients defined and discussed healthy vs unhealthy coping techniques ?Patients identified their preferred coping techniques and identified whether these were healthy or unhealthy ?Patients determined 2-3 healthy coping skills they would like to become more familiar with and use more often. ?Patients provided support and ideas to each other ? ? ?Summary of Patient Progress: Due to limited staffing, group was not held on the unit.  ? ? ?Therapeutic Modalities ?Cognitive Behavioral Therapy ?Motivational Interviewing ? ?Kenna Gilbert Grandy, LCSWA ?10/31/2021  4:30 PM   ?

## 2021-10-31 NOTE — Plan of Care (Signed)
Patient remains alert and oriented X2 with soft and delayed speech. Continues to require assistance with ADL's. Ambulatory with slow and unsteady gait. Denies pain or discomfort at this time. Denise depression and anxiety. Denies SI, HI, and AVH. Patient encouraged to ask for assistance. Ate breakfast in the day room with good appetite. AM blood sugar 129. Patient is currently in bed and in no apparent distress. Q 15 minute safety checks continue. ?  ? ? ?Problem: Education: ?Goal: Knowledge of [facility] Education information/materials will improve ?Outcome: Progressing ?Goal: Emotional status will improve ?Outcome: Progressing ?Goal: Mental status will improve ?Outcome: Progressing ?Goal: Verbalization of understanding the information provided will improve ?Outcome: Progressing ?  ?Problem: Activity: ?Goal: Interest or engagement in activities will improve ?Outcome: Progressing ?Goal: Sleeping patterns will improve ?Outcome: Progressing ?  ?Problem: Coping: ?Goal: Ability to verbalize frustrations and anger appropriately will improve ?Outcome: Progressing ?Goal: Ability to demonstrate self-control will improve ?Outcome: Progressing ?  ?Problem: Health Behavior/Discharge Planning: ?Goal: Identification of resources available to assist in meeting health care needs will improve ?Outcome: Progressing ?Goal: Compliance with treatment plan for underlying cause of condition will improve ?Outcome: Progressing ?  ?Problem: Physical Regulation: ?Goal: Ability to maintain clinical measurements within normal limits will improve ?Outcome: Progressing ?  ?Problem: Safety: ?Goal: Periods of time without injury will increase ?Outcome: Progressing ?  ?

## 2021-10-31 NOTE — Progress Notes (Signed)
[facility] MD Progress Note ? ?10/31/2021 3:08 PM ?[place]  ?MRN:  [id number] ?Subjective:  Nursing Report : Patient remains alert and oriented X2 with soft and delayed speech. Continues to require assistance with ADL's. Ambulatory with slow and unsteady gait. Denies pain or discomfort at this time. Denise depression and anxiety. Denies SI, HI, and AVH. Patient encouraged to ask for assistance. Ate breakfast in the day room with good appetite. AM blood sugar 129. ? ?Patient remains calm, isolative and somewhat tangential . She reports feeling alright today, has bene taking her meds and has slept well last night.  ?Principal Problem: Schizoaffective disorder, bipolar type ([place]) ?Diagnosis: Principal Problem: ?  Schizoaffective disorder, bipolar type ([place]) ? ?Total Time spent with patient: 20 minutes ? ?Past Psychiatric History: Schizoaffective disorder, bipolar type ? ?Past Medical History:  ?Past Medical History:  ?Diagnosis Date  ? Depression   ? Diabetes mellitus without complication ([place])   ? Diabetes mellitus, type II ([place])   ? Hypertension   ? Schizophrenia ([place])   ? History reviewed. No pertinent surgical history. ?Family History:  ?Family History  ?Problem Relation Age of Onset  ? Diabetes Mother   ? Glaucoma Mother   ? Heart disease Mother   ? Heart attack Mother   ? Depression Mother   ? Hypertension Mother   ? Diabetes Father   ? Glaucoma Father   ? Hypertension Father   ? Diabetes Sister   ? Depression Sister   ? Post-traumatic stress disorder Sister   ? ?Family Psychiatric  History:  ?Social History:  ?Social History  ? ?Substance and Sexual Activity  ?Alcohol Use No  ? Alcohol/week: 0.0 standard drinks  ?   ?Social History  ? ?Substance and Sexual Activity  ?Drug Use No  ?  ?Social History  ? ?Socioeconomic History  ? Marital status: Single  ?  Spouse name: Not on file  ? Number of children: Not on file  ? Years of education: Not on file  ? Highest education level: Not on file  ?Occupational History  ?  Not on file  ?Tobacco Use  ? Smoking status: Every Day  ?  Packs/day: 1.00  ?  Types: Cigarettes  ?  Start date: 04/21/1978  ? Smokeless tobacco: Never  ?Vaping Use  ? Vaping Use: Never used  ?Substance and Sexual Activity  ? Alcohol use: No  ?  Alcohol/week: 0.0 standard drinks  ? Drug use: No  ? Sexual activity: Not Currently  ?Other Topics Concern  ? Not on file  ?Social History Narrative  ? Not on file  ? ?Social Determinants of Health  ? ?Financial Resource Strain: Not on file  ?Food Insecurity: Not on file  ?Transportation Needs: Not on file  ?Physical Activity: Not on file  ?Stress: Not on file  ?Social Connections: Not on file  ? ?Additional Social History:  ?  ?  ?  ?  ?  ?  ?  ?  ?  ?  ?  ? ?Sleep: Fair ? ?Appetite:  Fair ? ?Current Medications: ?Current Facility-Administered Medications  ?Medication Dose Route Frequency Provider Last Rate Last Admin  ? acetaminophen (TYLENOL) tablet 650 mg  650 mg Oral Q6H PRN Caroline Sauger, NP   650 mg at 10/12/21 1031  ? alum & mag hydroxide-simeth (MAALOX/MYLANTA) 200-200-20 MG/5ML suspension 30 mL  30 mL Oral Q4H PRN Parks Ranger, DO   30 mL at 10/01/21 2215  ? cyclobenzaprine (FLEXERIL) tablet 10 mg  10 mg  Oral QHS Parks Ranger, DO   10 mg at 10/30/21 2113  ? diltiazem (CARDIZEM CD) 24 hr capsule 240 mg  240 mg Oral Daily Parks Ranger, DO   240 mg at 10/31/21 1025  ? escitalopram (LEXAPRO) tablet 10 mg  10 mg Oral Daily Parks Ranger, DO   10 mg at 10/31/21 1026  ? feeding supplement (GLUCERNA SHAKE) (GLUCERNA SHAKE) liquid 237 mL  237 mL Oral TID BM Clapacs, Madie Reno, MD   237 mL at 10/31/21 1029  ? Glycerin (Adult) 2 g suppository 1 suppository  1 suppository Rectal Daily PRN Parks Ranger, DO   1 suppository at 10/19/21 1538  ? hydrALAZINE (APRESOLINE) tablet 50 mg  50 mg Oral TID Parks Ranger, DO   50 mg at 10/31/21 1029  ? insulin aspart (novoLOG) injection 4 Units  4 Units Subcutaneous TID WC  Parks Ranger, DO   4 Units at 10/31/21 1150  ? linagliptin (TRADJENTA) tablet 5 mg  5 mg Oral Daily Parks Ranger, DO   5 mg at 10/31/21 1055  ? multivitamin (RENA-VIT) tablet 1 tablet  1 tablet Oral QHS Parks Ranger, DO   1 tablet at 10/30/21 2115  ? OLANZapine (ZYPREXA) tablet 10 mg  10 mg Oral QHS Parks Ranger, DO   10 mg at 10/30/21 2113  ? ondansetron (ZOFRAN) tablet 4 mg  4 mg Oral Q6H PRN Parks Ranger, DO      ? Or  ? ondansetron (ZOFRAN) injection 4 mg  4 mg Intravenous Q6H PRN Parks Ranger, DO      ? polyethylene glycol (MIRALAX / GLYCOLAX) packet 17 g  17 g Oral QPC breakfast Parks Ranger, DO   17 g at 10/31/21 1026  ? senna-docusate (Senokot-S) tablet 1 tablet  1 tablet Oral QHS Parks Ranger, DO   1 tablet at 10/30/21 2113  ? sorbitol, milk of mag, mineral oil, glycerin (SMOG) enema  960 mL Rectal Daily PRN Parks Ranger, DO   960 mL at 10/19/21 2202  ? traZODone (DESYREL) tablet 100 mg  100 mg Oral QHS Parks Ranger, DO   100 mg at 10/30/21 2113  ? ? ?Lab Results:  ?Results for orders placed or performed during the hospital encounter of 09/29/21 (from the past 48 hour(s))  ?Glucose, capillary     Status: Abnormal  ? Collection Time: 10/29/21  4:38 PM  ?Result Value Ref Range  ? Glucose-Capillary 153 (H) 70 - 99 mg/dL  ?  Comment: Glucose reference range applies only to samples taken after fasting for at least 8 hours.  ?Glucose, capillary     Status: Abnormal  ? Collection Time: 10/29/21  9:51 PM  ?Result Value Ref Range  ? Glucose-Capillary 172 (H) 70 - 99 mg/dL  ?  Comment: Glucose reference range applies only to samples taken after fasting for at least 8 hours.  ?Glucose, capillary     Status: Abnormal  ? Collection Time: 10/30/21  7:53 AM  ?Result Value Ref Range  ? Glucose-Capillary 143 (H) 70 - 99 mg/dL  ?  Comment: Glucose reference range applies only to samples taken after fasting for at  least 8 hours.  ?Glucose, capillary     Status: Abnormal  ? Collection Time: 10/30/21 11:40 AM  ?Result Value Ref Range  ? Glucose-Capillary 139 (H) 70 - 99 mg/dL  ?  Comment: Glucose reference range applies only to samples taken after fasting for at  least 8 hours.  ?Glucose, capillary     Status: Abnormal  ? Collection Time: 10/30/21  4:32 PM  ?Result Value Ref Range  ? Glucose-Capillary 111 (H) 70 - 99 mg/dL  ?  Comment: Glucose reference range applies only to samples taken after fasting for at least 8 hours.  ?Glucose, capillary     Status: Abnormal  ? Collection Time: 10/30/21  9:12 PM  ?Result Value Ref Range  ? Glucose-Capillary 109 (H) 70 - 99 mg/dL  ?  Comment: Glucose reference range applies only to samples taken after fasting for at least 8 hours.  ? Comment 1 Notify RN   ?Glucose, capillary     Status: Abnormal  ? Collection Time: 10/31/21  8:14 AM  ?Result Value Ref Range  ? Glucose-Capillary 128 (H) 70 - 99 mg/dL  ?  Comment: Glucose reference range applies only to samples taken after fasting for at least 8 hours.  ?Glucose, capillary     Status: Abnormal  ? Collection Time: 10/31/21 11:48 AM  ?Result Value Ref Range  ? Glucose-Capillary 253 (H) 70 - 99 mg/dL  ?  Comment: Glucose reference range applies only to samples taken after fasting for at least 8 hours.  ? ? ?Blood Alcohol level:  ?Lab Results  ?Component Value Date  ? ETH <10 09/20/2021  ? ETH <10 10/13/2020  ? ? ?Metabolic Disorder Labs: ?Lab Results  ?Component Value Date  ? HGBA1C 8.0 (H) 09/27/2021  ? MPG 182.9 09/27/2021  ? MPG 180.03 10/13/2020  ? ?No results found for: PROLACTIN ?Lab Results  ?Component Value Date  ? CHOL 267 (H) 10/13/2020  ? TRIG 180 (H) 10/13/2020  ? HDL 86 10/13/2020  ? CHOLHDL 3.1 10/13/2020  ? VLDL 36 10/13/2020  ? LDLCALC 145 (H) 10/13/2020  ? LDLCALC 158 (H) 02/29/2020  ? ? ?Physical Findings: ?AIMS:  , ,  ,  ,    ?CIWA:    ?COWS:    ? ?Musculoskeletal: ?Strength & Muscle Tone: within normal limits ?Gait &  Station: unsteady ?Patient leans: Front ? ?Psychiatric Specialty Exam: ? ?Presentation  ?General Appearance: Bizarre; Disheveled ? ?Eye Contact:Fair ? ?Speech:Garbled ? ?Speech Volume:Decreased ? ?Handedness:Ri

## 2021-10-31 NOTE — Progress Notes (Signed)
Patient presents A&O x2 with flat and blunted affect.  Speech soft with delayed response.  Denies AVH, SI, HI or pain.  Compliant with all evening meds.  Patient spent most of the evening in day room working on crossword.  Ambulating independent with steady gait.  Denies needs or complaints.  Will continue to monitor with Q 15 min safey checks ? ? ? 10/31/21 2000  ?Psych Admission Type (Psych Patients Only)  ?Admission Status Voluntary  ?Psychosocial Assessment  ?Patient Complaints None  ?Eye Contact Fair  ?Facial Expression Flat  ?Affect Flat  ?Speech Soft;Logical/coherent  ?Interaction Assertive  ?Motor Activity Slow  ?Appearance/Hygiene In scrubs  ?Behavior Characteristics Appropriate to situation  ?Mood Euthymic  ?Thought Process  ?Coherency WDL  ?Content WDL  ?Delusions None reported or observed  ?Perception WDL  ?Hallucination None reported or observed  ?Judgment Limited  ?Confusion Mild  ?Danger to Self  ?Current suicidal ideation? Denies  ?Self-Injurious Behavior No self-injurious ideation or behavior indicators observed or expressed   ?Danger to Others  ?Danger to Others None reported or observed  ? ? ?

## 2021-10-31 NOTE — Plan of Care (Signed)
?  Problem: Education: ?Goal: Knowledge of  General Education information/materials will improve ?Outcome: Progressing ?  ?Problem: Activity: ?Goal: Interest or engagement in activities will improve ?Outcome: Progressing ?Goal: Sleeping patterns will improve ?Outcome: Progressing ?  ?Problem: Health Behavior/Discharge Planning: ?Goal: Compliance with treatment plan for underlying cause of condition will improve ?Outcome: Progressing ?  ?

## 2021-11-01 LAB — GLUCOSE, CAPILLARY
Glucose-Capillary: 114 mg/dL — ABNORMAL HIGH (ref 70–99)
Glucose-Capillary: 232 mg/dL — ABNORMAL HIGH (ref 70–99)
Glucose-Capillary: 157 mg/dL — ABNORMAL HIGH (ref 70–99)

## 2021-11-01 LAB — CREATININE, SERUM
Creatinine, Ser: 1.91 mg/dL — ABNORMAL HIGH (ref 0.44–1.00)
GFR, Estimated: 29 mL/min — ABNORMAL LOW (ref >60)

## 2021-11-01 NOTE — Progress Notes (Signed)
[facility] MD Progress Note ? ?11/01/2021 1:05 PM ?[employer]  ?MRN:  [id number] ?Subjective:  Patient presents A&O x2 with flat and blunted affect.  Speech soft with delayed response.  Denies AVH, SI, HI or pain.  Compliant with all evening meds.  Patient spent most of the evening in day room working on crossword.  Ambulating independent with steady gait.  Denies needs or complaints.  ?She is displaying disorganized thoughts, is taking her meds and is mostly quiet on the floor,.  ?Principal Problem: Schizoaffective disorder, bipolar type ([place]) ?Diagnosis: Principal Problem: ?  Schizoaffective disorder, bipolar type ([place]) ? ?Total Time spent with patient: 20 minutes ? ?Past Psychiatric History: Schizoaffective disorder, bipolar type ? ?Past Medical History:  ?Past Medical History:  ?Diagnosis Date  ? Depression   ? Diabetes mellitus without complication ([place])   ? Diabetes mellitus, type II ([place])   ? Hypertension   ? Schizophrenia ([place])   ? History reviewed. No pertinent surgical history. ?Family History:  ?Family History  ?Problem Relation Age of Onset  ? Diabetes Mother   ? Glaucoma Mother   ? Heart disease Mother   ? Heart attack Mother   ? Depression Mother   ? Hypertension Mother   ? Diabetes Father   ? Glaucoma Father   ? Hypertension Father   ? Diabetes Sister   ? Depression Sister   ? Post-traumatic stress disorder Sister   ? ?Family Psychiatric  History:  ?Social History:  ?Social History  ? ?Substance and Sexual Activity  ?Alcohol Use No  ? Alcohol/week: 0.0 standard drinks  ?   ?Social History  ? ?Substance and Sexual Activity  ?Drug Use No  ?  ?Social History  ? ?Socioeconomic History  ? Marital status: Single  ?  Spouse name: Not on file  ? Number of children: Not on file  ? Years of education: Not on file  ? Highest education level: Not on file  ?Occupational History  ? Not on file  ?Tobacco Use  ? Smoking status: Every Day  ?  Packs/day: 1.00  ?  Types: Cigarettes  ?  Start date: 04/21/1978  ? Smokeless  tobacco: Never  ?Vaping Use  ? Vaping Use: Never used  ?Substance and Sexual Activity  ? Alcohol use: No  ?  Alcohol/week: 0.0 standard drinks  ? Drug use: No  ? Sexual activity: Not Currently  ?Other Topics Concern  ? Not on file  ?Social History Narrative  ? Not on file  ? ?Social Determinants of Health  ? ?Financial Resource Strain: Not on file  ?Food Insecurity: Not on file  ?Transportation Needs: Not on file  ?Physical Activity: Not on file  ?Stress: Not on file  ?Social Connections: Not on file  ? ?Additional Social History:  ?  ?  ?  ?  ?  ?  ?  ?  ?  ?  ?  ? ?Sleep: Good ? ?Appetite:  Fair ? ?Current Medications: ?Current Facility-Administered Medications  ?Medication Dose Route Frequency Provider Last Rate Last Admin  ? acetaminophen (TYLENOL) tablet 650 mg  650 mg Oral Q6H PRN Caroline Sauger, NP   650 mg at 10/12/21 1031  ? alum & mag hydroxide-simeth (MAALOX/MYLANTA) 200-200-20 MG/5ML suspension 30 mL  30 mL Oral Q4H PRN Parks Ranger, DO   30 mL at 10/01/21 2215  ? cyclobenzaprine (FLEXERIL) tablet 10 mg  10 mg Oral QHS Parks Ranger, DO   10 mg at 10/31/21 2140  ? diltiazem (CARDIZEM  CD) 24 hr capsule 240 mg  240 mg Oral Daily Parks Ranger, DO   240 mg at 11/01/21 0915  ? escitalopram (LEXAPRO) tablet 10 mg  10 mg Oral Daily Parks Ranger, DO   10 mg at 11/01/21 6837  ? feeding supplement (GLUCERNA SHAKE) (GLUCERNA SHAKE) liquid 237 mL  237 mL Oral TID BM Clapacs, Madie Reno, MD   237 mL at 11/01/21 0933  ? Glycerin (Adult) 2 g suppository 1 suppository  1 suppository Rectal Daily PRN Parks Ranger, DO   1 suppository at 10/19/21 1538  ? hydrALAZINE (APRESOLINE) tablet 50 mg  50 mg Oral TID Parks Ranger, DO   50 mg at 11/01/21 2902  ? insulin aspart (novoLOG) injection 4 Units  4 Units Subcutaneous TID WC Parks Ranger, DO   4 Units at 11/01/21 1201  ? linagliptin (TRADJENTA) tablet 5 mg  5 mg Oral Daily Parks Ranger,  DO   5 mg at 11/01/21 0915  ? multivitamin (RENA-VIT) tablet 1 tablet  1 tablet Oral QHS Parks Ranger, DO   1 tablet at 10/31/21 2139  ? OLANZapine (ZYPREXA) tablet 10 mg  10 mg Oral QHS Parks Ranger, DO   10 mg at 10/31/21 2140  ? ondansetron (ZOFRAN) tablet 4 mg  4 mg Oral Q6H PRN Parks Ranger, DO      ? Or  ? ondansetron (ZOFRAN) injection 4 mg  4 mg Intravenous Q6H PRN Parks Ranger, DO      ? polyethylene glycol (MIRALAX / GLYCOLAX) packet 17 g  17 g Oral QPC breakfast Parks Ranger, DO   17 g at 11/01/21 1115  ? senna-docusate (Senokot-S) tablet 1 tablet  1 tablet Oral QHS Parks Ranger, DO   1 tablet at 10/31/21 2140  ? sorbitol, milk of mag, mineral oil, glycerin (SMOG) enema  960 mL Rectal Daily PRN Parks Ranger, DO   960 mL at 10/19/21 2202  ? traZODone (DESYREL) tablet 100 mg  100 mg Oral QHS Parks Ranger, DO   100 mg at 10/31/21 2140  ? ? ?Lab Results:  ?Results for orders placed or performed during the hospital encounter of 09/29/21 (from the past 48 hour(s))  ?Glucose, capillary     Status: Abnormal  ? Collection Time: 10/30/21  4:32 PM  ?Result Value Ref Range  ? Glucose-Capillary 111 (H) 70 - 99 mg/dL  ?  Comment: Glucose reference range applies only to samples taken after fasting for at least 8 hours.  ?Glucose, capillary     Status: Abnormal  ? Collection Time: 10/30/21  9:12 PM  ?Result Value Ref Range  ? Glucose-Capillary 109 (H) 70 - 99 mg/dL  ?  Comment: Glucose reference range applies only to samples taken after fasting for at least 8 hours.  ? Comment 1 Notify RN   ?Glucose, capillary     Status: Abnormal  ? Collection Time: 10/31/21  8:14 AM  ?Result Value Ref Range  ? Glucose-Capillary 128 (H) 70 - 99 mg/dL  ?  Comment: Glucose reference range applies only to samples taken after fasting for at least 8 hours.  ?Glucose, capillary     Status: Abnormal  ? Collection Time: 10/31/21 11:48 AM  ?Result Value Ref  Range  ? Glucose-Capillary 253 (H) 70 - 99 mg/dL  ?  Comment: Glucose reference range applies only to samples taken after fasting for at least 8 hours.  ?Glucose, capillary  Status: Abnormal  ? Collection Time: 10/31/21  4:38 PM  ?Result Value Ref Range  ? Glucose-Capillary 158 (H) 70 - 99 mg/dL  ?  Comment: Glucose reference range applies only to samples taken after fasting for at least 8 hours.  ?Creatinine, serum     Status: Abnormal  ? Collection Time: 11/01/21  7:13 AM  ?Result Value Ref Range  ? Creatinine, Ser 1.91 (H) 0.44 - 1.00 mg/dL  ? GFR, Estimated 29 (L) >60 mL/min  ?  Comment: (NOTE) ?Calculated using the CKD-EPI Creatinine Equation (2021) ?Performed at [facility], [place], ?[place] [address] ?  ?Glucose, capillary     Status: Abnormal  ? Collection Time: 11/01/21  7:38 AM  ?Result Value Ref Range  ? Glucose-Capillary 114 (H) 70 - 99 mg/dL  ?  Comment: Glucose reference range applies only to samples taken after fasting for at least 8 hours.  ?Glucose, capillary     Status: Abnormal  ? Collection Time: 11/01/21 11:57 AM  ?Result Value Ref Range  ? Glucose-Capillary 232 (H) 70 - 99 mg/dL  ?  Comment: Glucose reference range applies only to samples taken after fasting for at least 8 hours.  ? ? ?Blood Alcohol level:  ?Lab Results  ?Component Value Date  ? ETH <10 09/20/2021  ? ETH <10 10/13/2020  ? ? ?Metabolic Disorder Labs: ?Lab Results  ?Component Value Date  ? HGBA1C 8.0 (H) 09/27/2021  ? MPG 182.9 09/27/2021  ? MPG 180.03 10/13/2020  ? ?No results found for: PROLACTIN ?Lab Results  ?Component Value Date  ? CHOL 267 (H) 10/13/2020  ? TRIG 180 (H) 10/13/2020  ? HDL 86 10/13/2020  ? CHOLHDL 3.1 10/13/2020  ? VLDL 36 10/13/2020  ? LDLCALC 145 (H) 10/13/2020  ? LDLCALC 158 (H) 02/29/2020  ? ? ?Physical Findings: ?AIMS:  , ,  ,  ,    ?CIWA:    ?COWS:    ? ?Musculoskeletal: ?Strength & Muscle Tone: within normal limits ?Gait & Station: normal ?Patient leans:  N/A ? ?Psychiatric Specialty Exam: ? ?Presentation  ?General Appearance: Bizarre; Disheveled ? ?Eye Contact:Fair ? ?Speech:Garbled ? ?Speech Volume:Decreased ? ?Handedness:Right ? ? ?Mood and Affect  ?Mood:No data record

## 2021-11-01 NOTE — Progress Notes (Signed)
Patient presents A&O x2 with flat and blunted affect.  Speech soft with delayed response.  Denies AVH, SI or HI.  Pt c/o left hand discomfort "hurst a little".  Noted  2+ non pitting edema to left hand.  Declines Tylenol, elevated hand on pillow.  Compliant with all evening meds.  Patient spent most of the evening in day room working on crossword.  Ambulating independent with steady gait.  Denies needs or complaints.  Will continue to monitor with Q 15 min safey checks ?  ?

## 2021-11-01 NOTE — Group Note (Signed)
LCSW Group Therapy Note ? ?Group Date: 11/01/2021 ?Start Time: 1300 ?End Time: 1610 ? ? ?Type of Therapy and Topic:  Group Therapy - Healthy vs Unhealthy Coping Skills ? ?Participation Level:  Active  ? ?Description of Group ?The focus of this group was to determine what unhealthy coping techniques typically are used by group members and what healthy coping techniques would be helpful in coping with various problems. Patients were guided in becoming aware of the differences between healthy and unhealthy coping techniques. Patients were asked to identify 2-3 healthy coping skills they would like to learn to use more effectively. ? ?Therapeutic Goals ?Patients learned that coping is what human beings do all day long to deal with various situations in their lives ?Patients defined and discussed healthy vs unhealthy coping techniques ?Patients identified their preferred coping techniques and identified whether these were healthy or unhealthy ?Patients determined 2-3 healthy coping skills they would like to become more familiar with and use more often. ?Patients provided support and ideas to each other ? ? ?Summary of Patient Progress: Patient was present for the entirety of the group session. Patient was an active listener and participated in the topic of discussion. 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 Patient identified "getting proper sleep" as a healthy coping skill she would to work towards. ? ?Therapeutic Modalities ?Cognitive Behavioral Therapy ?Motivational Interviewing ? ?Kenna Gilbert [place], LCSWA ?11/01/2021  3:37 PM   ?

## 2021-11-01 NOTE — Plan of Care (Signed)
Patient remains alert and oriented X2 with soft and delayed speech. Continues to require assistance with ADL's. Ambulatory with slow and unsteady gait. Denies pain or discomfort at this time. Denise depression and anxiety. Denies SI, HI, and AVH. Patient encouraged to ask for assistance. Ate breakfast in the day room with good appetite. AM blood sugar 114. Patient is currently in bed and in no apparent distress. Q 15 minute safety checks continue. ? ? ?Problem: Education: ?Goal: Knowledge of [place] [facility] Education information/materials will improve ?Outcome: Progressing ?Goal: Emotional status will improve ?Outcome: Progressing ?Goal: Mental status will improve ?Outcome: Progressing ?Goal: Verbalization of understanding the information provided will improve ?Outcome: Progressing ?  ?Problem: Activity: ?Goal: Interest or engagement in activities will improve ?Outcome: Progressing ?Goal: Sleeping patterns will improve ?Outcome: Progressing ?  ?Problem: Coping: ?Goal: Ability to verbalize frustrations and anger appropriately will improve ?Outcome: Progressing ?Goal: Ability to demonstrate self-control will improve ?Outcome: Progressing ?  ?Problem: Health Behavior/Discharge Planning: ?Goal: Identification of resources available to assist in meeting health care needs will improve ?Outcome: Progressing ?Goal: Compliance with treatment plan for underlying cause of condition will improve ?Outcome: Progressing ?  ?Problem: Physical Regulation: ?Goal: Ability to maintain clinical measurements within normal limits will improve ?Outcome: Progressing ?  ?Problem: Safety: ?Goal: Periods of time without injury will increase ?Outcome: Progressing ?  ?

## 2021-11-02 DIAGNOSIS — F250 Schizoaffective disorder, bipolar type: Secondary | ICD-10-CM | POA: Diagnosis not present

## 2021-11-02 LAB — GLUCOSE, CAPILLARY
Glucose-Capillary: 126 mg/dL — ABNORMAL HIGH (ref 70–99)
Glucose-Capillary: 116 mg/dL — ABNORMAL HIGH (ref 70–99)
Glucose-Capillary: 98 mg/dL (ref 70–99)

## 2021-11-02 MED ORDER — DILTIAZEM HCL ER COATED BEADS 300 MG PO CP24
300.0000 mg | ORAL_CAPSULE | Freq: Every day | ORAL | Status: DC
  Administered 2021-11-03 – 2021-11-07 (×5): 300 mg via ORAL
  Filled 2021-11-02 (×5): qty 1

## 2021-11-02 NOTE — Progress Notes (Signed)
[facility] MD Progress Note ? ?11/02/2021 12:35 PM ?[place]  ?MRN:  [id number] ?Subjective: Patient was seen and examined.  She states that she is doing well.  She wants to talk to Education officer, museum.  We still do not have any placement for her and she cannot live on her own.  She is taking her medications as prescribed and denies any side effects.  There is no evidence of EPS or TD.  Her blood pressure continues to be an issue. ? ?Principal Problem: Schizoaffective disorder, bipolar type ([place]) ?Diagnosis: Principal Problem: ?  Schizoaffective disorder, bipolar type ([place]) ? ?Total Time spent with patient: 15 minutes ? ?Past Psychiatric History: Yes ? ?Past Medical History:  ?Past Medical History:  ?Diagnosis Date  ? Depression   ? Diabetes mellitus without complication ([place])   ? Diabetes mellitus, type II ([place])   ? Hypertension   ? Schizophrenia ([place])   ? History reviewed. No pertinent surgical history. ?Family History:  ?Family History  ?Problem Relation Age of Onset  ? Diabetes Mother   ? Glaucoma Mother   ? Heart disease Mother   ? Heart attack Mother   ? Depression Mother   ? Hypertension Mother   ? Diabetes Father   ? Glaucoma Father   ? Hypertension Father   ? Diabetes Sister   ? Depression Sister   ? Post-traumatic stress disorder Sister   ?Social History:  ?Social History  ? ?Substance and Sexual Activity  ?Alcohol Use No  ? Alcohol/week: 0.0 standard drinks  ?   ?Social History  ? ?Substance and Sexual Activity  ?Drug Use No  ?  ?Social History  ? ?Socioeconomic History  ? Marital status: Single  ?  Spouse name: Not on file  ? Number of children: Not on file  ? Years of education: Not on file  ? Highest education level: Not on file  ?Occupational History  ? Not on file  ?Tobacco Use  ? Smoking status: Every Day  ?  Packs/day: 1.00  ?  Types: Cigarettes  ?  Start date: 04/21/1978  ? Smokeless tobacco: Never  ?Vaping Use  ? Vaping Use: Never used  ?Substance and Sexual Activity  ? Alcohol use: No  ?  Alcohol/week:  0.0 standard drinks  ? Drug use: No  ? Sexual activity: Not Currently  ?Other Topics Concern  ? Not on file  ?Social History Narrative  ? Not on file  ? ?Social Determinants of Health  ? ?Financial Resource Strain: Not on file  ?Food Insecurity: Not on file  ?Transportation Needs: Not on file  ?Physical Activity: Not on file  ?Stress: Not on file  ?Social Connections: Not on file  ? ?Additional Social History:  ?  ?  ?  ?  ?  ?  ?  ?  ?  ?  ?  ? ?Sleep: Good ? ?Appetite:  Fair ? ?Current Medications: ?Current Facility-Administered Medications  ?Medication Dose Route Frequency Provider Last Rate Last Admin  ? acetaminophen (TYLENOL) tablet 650 mg  650 mg Oral Q6H PRN Caroline Sauger, NP   650 mg at 11/01/21 1523  ? alum & mag hydroxide-simeth (MAALOX/MYLANTA) 200-200-20 MG/5ML suspension 30 mL  30 mL Oral Q4H PRN Parks Ranger, DO   30 mL at 10/01/21 2215  ? cyclobenzaprine (FLEXERIL) tablet 10 mg  10 mg Oral QHS Parks Ranger, DO   10 mg at 11/01/21 2129  ? diltiazem (CARDIZEM CD) 24 hr capsule 240 mg  240 mg Oral  Daily Parks Ranger, DO   240 mg at 11/02/21 0935  ? escitalopram (LEXAPRO) tablet 10 mg  10 mg Oral Daily Parks Ranger, DO   10 mg at 11/02/21 0935  ? feeding supplement (GLUCERNA SHAKE) (GLUCERNA SHAKE) liquid 237 mL  237 mL Oral TID BM Clapacs, Madie [place]o, MD   237 mL at 11/02/21 0941  ? Glycerin (Adult) 2 g suppository 1 suppository  1 suppository Rectal Daily PRN Parks Ranger, DO   1 suppository at 10/19/21 1538  ? hydrALAZINE (APRESOLINE) tablet 50 mg  50 mg Oral TID Parks Ranger, DO   50 mg at 11/02/21 0845  ? insulin aspart (novoLOG) injection 4 Units  4 Units Subcutaneous TID WC Parks Ranger, DO   4 Units at 11/02/21 1151  ? linagliptin (TRADJENTA) tablet 5 mg  5 mg Oral Daily Parks Ranger, DO   5 mg at 11/02/21 1601  ? multivitamin (RENA-VIT) tablet 1 tablet  1 tablet Oral QHS Parks Ranger, DO   1  tablet at 11/01/21 2129  ? OLANZapine (ZYPREXA) tablet 10 mg  10 mg Oral QHS Parks Ranger, DO   10 mg at 11/01/21 2129  ? ondansetron (ZOFRAN) tablet 4 mg  4 mg Oral Q6H PRN Parks Ranger, DO      ? Or  ? ondansetron (ZOFRAN) injection 4 mg  4 mg Intravenous Q6H PRN Parks Ranger, DO      ? polyethylene glycol (MIRALAX / GLYCOLAX) packet 17 g  17 g Oral QPC breakfast Parks Ranger, DO   17 g at 11/02/21 1054  ? senna-docusate (Senokot-S) tablet 1 tablet  1 tablet Oral QHS Parks Ranger, DO   1 tablet at 11/01/21 2129  ? sorbitol, milk of mag, mineral oil, glycerin (SMOG) enema  960 mL Rectal Daily PRN Parks Ranger, DO   960 mL at 10/19/21 2202  ? traZODone (DESYREL) tablet 100 mg  100 mg Oral QHS Parks Ranger, DO   100 mg at 11/01/21 2137  ? ? ?Lab Results:  ?Results for orders placed or performed during the hospital encounter of 09/29/21 (from the past 48 hour(s))  ?Glucose, capillary     Status: Abnormal  ? Collection Time: 10/31/21  4:38 PM  ?Result Value Ref Range  ? Glucose-Capillary 158 (H) 70 - 99 mg/dL  ?  Comment: Glucose reference range applies only to samples taken after fasting for at least 8 hours.  ?Creatinine, serum     Status: Abnormal  ? Collection Time: 11/01/21  7:13 AM  ?Result Value Ref Range  ? Creatinine, Ser 1.91 (H) 0.44 - 1.00 mg/dL  ? GFR, Estimated 29 (L) >60 mL/min  ?  Comment: (NOTE) ?Calculated using the CKD-EPI Creatinine Equation (2021) ?Performed at [facility], [place], ?[place] [address] ?  ?Glucose, capillary     Status: Abnormal  ? Collection Time: 11/01/21  7:38 AM  ?Result Value Ref Range  ? Glucose-Capillary 114 (H) 70 - 99 mg/dL  ?  Comment: Glucose reference range applies only to samples taken after fasting for at least 8 hours.  ?Glucose, capillary     Status: Abnormal  ? Collection Time: 11/01/21 11:57 AM  ?Result Value Ref Range  ? Glucose-Capillary 232 (H) 70 - 99 mg/dL   ?  Comment: Glucose reference range applies only to samples taken after fasting for at least 8 hours.  ?Glucose, capillary     Status: Abnormal  ?  Collection Time: 11/01/21  4:35 PM  ?Result Value Ref Range  ? Glucose-Capillary 157 (H) 70 - 99 mg/dL  ?  Comment: Glucose reference range applies only to samples taken after fasting for at least 8 hours.  ?Glucose, capillary     Status: Abnormal  ? Collection Time: 11/02/21  8:03 AM  ?Result Value Ref Range  ? Glucose-Capillary 126 (H) 70 - 99 mg/dL  ?  Comment: Glucose reference range applies only to samples taken after fasting for at least 8 hours.  ?Glucose, capillary     Status: Abnormal  ? Collection Time: 11/02/21 11:44 AM  ?Result Value Ref Range  ? Glucose-Capillary 116 (H) 70 - 99 mg/dL  ?  Comment: Glucose reference range applies only to samples taken after fasting for at least 8 hours.  ? ? ?Blood Alcohol level:  ?Lab Results  ?Component Value Date  ? ETH <10 09/20/2021  ? ETH <10 10/13/2020  ? ? ?Metabolic Disorder Labs: ?Lab Results  ?Component Value Date  ? HGBA1C 8.0 (H) 09/27/2021  ? MPG 182.9 09/27/2021  ? MPG 180.03 10/13/2020  ? ?No results found for: PROLACTIN ?Lab Results  ?Component Value Date  ? CHOL 267 (H) 10/13/2020  ? TRIG 180 (H) 10/13/2020  ? HDL 86 10/13/2020  ? CHOLHDL 3.1 10/13/2020  ? VLDL 36 10/13/2020  ? LDLCALC 145 (H) 10/13/2020  ? LDLCALC 158 (H) 02/29/2020  ? ? ?Physical Findings: ?AIMS:  , ,  ,  ,    ?CIWA:    ?COWS:    ? ?Musculoskeletal: ?Strength & Muscle Tone: within normal limits ?Gait & Station: normal ?Patient leans: N/A ? ?Psychiatric Specialty Exam: ? ?Presentation  ?General Appearance: Bizarre; Disheveled ? ?Eye Contact:Fair ? ?Speech:Garbled ? ?Speech Volume:Decreased ? ?Handedness:Right ? ? ?Mood and Affect  ?Mood:No data recorded ?Affect:Flat ? ? ?Thought Process  ?Thought Processes:Disorganized ? ?Descriptions of Associations:Loose ? ?Orientation:Partial ? ?Thought Content:Illogical ? ?History of  Schizophrenia/Schizoaffective disorder:Yes ? ?Duration of Psychotic Symptoms:Greater than six months ? ?Hallucinations:No data recorded ?Ideas of Reference:No data recorded ?Suicidal Thoughts:No data recorded ?Homicidal Th

## 2021-11-02 NOTE — Progress Notes (Signed)
Recreation Therapy Notes ? ? ?Date: 11/02/2021 ? ?Time: 1:35pm    ? ?Location: Courtyard   ? ?Behavioral response: N/A ?  ?Intervention Topic: Emotions   ? ?Discussion/Intervention: ?Patient refused to attend group.  ? ?Clinical Observations/Feedback:  ?Patient refused to attend group.  ?  ?Kevon Tench LRT/CTRS ? ? ? ? ? ? ? ? ?Larissa Pegg ?11/02/2021 3:19 PM ?

## 2021-11-02 NOTE — Progress Notes (Signed)
Patient remains alert and oriented x2 with soft and delayed responses. Patient denies SI, HI, AVH, depression and anxiety. Patient ate breakfast and lunch in the dayroom. Patient continues to require assistance with ADLs, ambulates with slow and steady gait. BS checks and medication administered as ordered. Patient denies any pain or discomfort at this time. Patient is currently in the day room. Will continue on Q 15 minutes checks. ?

## 2021-11-02 NOTE — Progress Notes (Signed)
Patient presents A&O x2 with flat and blunted affect.  Speech soft with delayed response.  Denies AVH, SI or HI.  Pt c/o left hand discomfort "hurst a little".  Noted  2+ non pitting edema to left hand.  Declines Tylenol, elevated hand on pillow.  Compliant with all evening meds.  Patient spent most of the evening in her bed sleeping except during snack time.  Ambulating independent with steady gait.  Denies needs or complaints.  Will continue to monitor with Q 15 min safey checks ?

## 2021-11-02 NOTE — Group Note (Signed)
[place] LCSW Group Therapy Note ? ? ? ?Group Date: 11/02/2021 ?Start Time: 1430 ?End Time: 7408 ? ?Type of Therapy and Topic:  Group Therapy:  Overcoming Obstacles ? ?Participation Level:  [facility] PARTICIPATION LEVEL: Did Not Attend ? ?Mood: ? ?Description of Group:   ?In this group patients will be encouraged to explore what they see as obstacles to their own wellness and recovery. They will be guided to discuss their thoughts, feelings, and behaviors related to these obstacles. The group will process together ways to cope with barriers, with attention given to specific choices patients can make. Each patient will be challenged to identify changes they are motivated to make in order to overcome their obstacles. This group will be process-oriented, with patients participating in exploration of their own experiences as well as giving and receiving support and challenge from other group members. ? ?Therapeutic Goals: ?1. Patient will identify personal and current obstacles as they relate to admission. ?2. Patient will identify barriers that currently interfere with their wellness or overcoming obstacles.  ?3. Patient will identify feelings, thought process and behaviors related to these barriers. ?4. Patient will identify two changes they are willing to make to overcome these obstacles:  ? ? ?Summary of Patient Progress ? ? ?Patient did not attend group despite encouraged participation.  ? ? ?Therapeutic Modalities:   ?Cognitive Behavioral Therapy ?Solution Focused Therapy ?Motivational Interviewing ?Relapse Prevention Therapy ? ? ?Durenda Hurt, LCSWA ?

## 2021-11-03 DIAGNOSIS — F250 Schizoaffective disorder, bipolar type: Secondary | ICD-10-CM | POA: Diagnosis not present

## 2021-11-03 LAB — GLUCOSE, CAPILLARY
Glucose-Capillary: 142 mg/dL — ABNORMAL HIGH (ref 70–99)
Glucose-Capillary: 125 mg/dL — ABNORMAL HIGH (ref 70–99)
Glucose-Capillary: 77 mg/dL (ref 70–99)
Glucose-Capillary: 132 mg/dL — ABNORMAL HIGH (ref 70–99)
Glucose-Capillary: 150 mg/dL — ABNORMAL HIGH (ref 70–99)

## 2021-11-03 NOTE — Progress Notes (Signed)
Patient remains alert and oriented x2 with soft and delayed responses. Patient denies SI, HI, AVH, depression and anxiety. Patient ate breakfast and lunch in the dayroom. Patient continues to require assistance with ADLs, ambulates with slow and steady gait. BS checks and medication administered as ordered. Patient participated in group at about 1:20pm and was reported having dizzy spells, BS was checked with results of '77mg'$ /dl. Patient was offered snacks and BS was re-checked within an hour with results of '132mg'$  /dl. Patient denies any pain or discomfort at this time. Patient is currently in the day room. Will continue on Q 15 minutes safety checks. ?

## 2021-11-03 NOTE — Progress Notes (Signed)
Recreation Therapy Notes ? ?Date: 11/03/2021 ?  ?Time: 1:05 pm    ?  ?Location: [facility] [place]  ?  ?Behavioral response: Appropriate ?  ?Intervention Topic:  Leisure   ?  ?Discussion/Intervention:  ?Group content today was focused on leisure. The group defined what leisure is and some positive leisure activities they participate in. Individuals identified the difference between good and bad leisure. Participants expressed how they feel after participating in the leisure of their choice. The group discussed how they go about picking a leisure activity and if others are involved in their leisure activities. The patient stated how many leisure activities they have to choose from and reasons why it is important to have leisure time. Individuals participated in the intervention ?Exploration of Leisure? where they had a chance to identify new leisure activities as well as benefits of leisure. ?Clinical Observations/Feedback: ?Patient came to group late and was focused on what peers and staff had to say about leisure. Participant participated in open leisure while being appropriate with staff and peers. Individual was social with peers and staff while participating in the intervention.    ?Arihana Ambrocio LRT/CTRS  ? ? ? ? ? ? ? ?Okla Qazi ?11/03/2021 2:00 PM ?

## 2021-11-03 NOTE — BHH Group Notes (Signed)
[facility] Group Notes:  (Nursing/MHT/Case Management/Adjunct) ? ?Date:  11/03/2021  ?Time:  10:00 AM ? ?Type of Therapy:  Group Therapy ? ?Participation Level:  Active ? ?Participation Quality:  Appropriate ? ?Affect:  Appropriate ? ?Cognitive:  Appropriate ? ?Insight:  Appropriate ? ?Engagement in Group:  Engaged ? ?Modes of Intervention:  Discussion ? ?Summary of Progress/Problems: ?The pt attended group and was actively engaged. The pt shared when asked to participate in the discussion. ?Alicia Rogers ?11/03/2021, 2:54 PM ?

## 2021-11-03 NOTE — BHH Counselor (Signed)
CSW contacted Janell Quiet, consultation services, who stated that Kathrine Cords from [facility] is interested in doing a site visit with pt to determine placement fit Wednesday.  ? ?CSW will inform pt and discuss site visit interest for tomorrow.  ? ? ?Ezeriah Luty [place], MSW, LCSW-A ?4/4/20238:53 AM  ? ?

## 2021-11-03 NOTE — Progress Notes (Signed)
[facility] MD Progress Note ? ?11/03/2021 10:40 AM ?Alicia Rogers  ?MRN:  [id number] ?Subjective:  CSW contacted Janell Quiet, consultation services, who stated that Kathrine Cords from [facility] is interested in doing a site visit with pt to determine placement fit Wednesday.  She continues to do well.  She is taking her medications as prescribed and denies any side effects.  She has been pleasant and cooperative.  No SI, HI, or auditory or visual hallucinations. ? ?Principal Problem: Schizoaffective disorder, bipolar type ([place]) ?Diagnosis: Principal Problem: ?  Schizoaffective disorder, bipolar type ([place]) ? ?Total Time spent with patient: 15 minutes ? ?Past Psychiatric History: [place] [facility] ? ?Past Medical History:  ?Past Medical History:  ?Diagnosis Date  ? Depression   ? Diabetes mellitus without complication ([place])   ? Diabetes mellitus, type II ([place])   ? Hypertension   ? Schizophrenia ([place])   ? History reviewed. No pertinent surgical history. ?Family History:  ?Family History  ?Problem Relation Age of Onset  ? Diabetes Mother   ? Glaucoma Mother   ? Heart disease Mother   ? Heart attack Mother   ? Depression Mother   ? Hypertension Mother   ? Diabetes Father   ? Glaucoma Father   ? Hypertension Father   ? Diabetes Sister   ? Depression Sister   ? Post-traumatic stress disorder Sister   ? ? ?Social History:  ?Social History  ? ?Substance and Sexual Activity  ?Alcohol Use No  ? Alcohol/week: 0.0 standard drinks  ?   ?Social History  ? ?Substance and Sexual Activity  ?Drug Use No  ?  ?Social History  ? ?Socioeconomic History  ? Marital status: Single  ?  Spouse name: Not on file  ? Number of children: Not on file  ? Years of education: Not on file  ? Highest education level: Not on file  ?Occupational History  ? Not on file  ?Tobacco Use  ? Smoking status: Every Day  ?  Packs/day: 1.00  ?  Types: Cigarettes  ?  Start date: 04/21/1978  ? Smokeless tobacco: Never  ?Vaping Use  ? Vaping  Use: Never used  ?Substance and Sexual Activity  ? Alcohol use: No  ?  Alcohol/week: 0.0 standard drinks  ? Drug use: No  ? Sexual activity: Not Currently  ?Other Topics Concern  ? Not on file  ?Social History Narrative  ? Not on file  ? ?Social Determinants of Health  ? ?Financial Resource Strain: Not on file  ?Food Insecurity: Not on file  ?Transportation Needs: Not on file  ?Physical Activity: Not on file  ?Stress: Not on file  ?Social Connections: Not on file  ? ?Additional Social History:  ?  ?  ?  ?  ?  ?  ?  ?  ?  ?  ?  ? ?Sleep: Good ? ?Appetite:  Fair ? ?Current Medications: ?Current Facility-Administered Medications  ?Medication Dose Route Frequency Provider Last Rate Last Admin  ? acetaminophen (TYLENOL) tablet 650 mg  650 mg Oral Q6H PRN Caroline Sauger, NP   650 mg at 11/01/21 1523  ? alum & mag hydroxide-simeth (MAALOX/MYLANTA) 200-200-20 MG/5ML suspension 30 mL  30 mL Oral Q4H PRN Parks Ranger, DO   30 mL at 10/01/21 2215  ? cyclobenzaprine (FLEXERIL) tablet 10 mg  10 mg Oral QHS Parks Ranger, DO   10 mg at 11/02/21 2107  ? diltiazem (CARDIZEM CD) 24 hr capsule 300 mg  300 mg Oral Daily Parks Ranger, DO   300 mg at 11/03/21 1610  ? escitalopram (LEXAPRO) tablet 10 mg  10 mg Oral Daily Parks Ranger, DO   10 mg at 11/03/21 9604  ? feeding supplement (GLUCERNA SHAKE) (GLUCERNA SHAKE) liquid 237 mL  237 mL Oral TID BM Clapacs, Madie Reno, MD   237 mL at 11/03/21 0921  ? Glycerin (Adult) 2 g suppository 1 suppository  1 suppository Rectal Daily PRN Parks Ranger, DO   1 suppository at 10/19/21 1538  ? hydrALAZINE (APRESOLINE) tablet 50 mg  50 mg Oral TID Parks Ranger, DO   50 mg at 11/03/21 0830  ? insulin aspart (novoLOG) injection 4 Units  4 Units Subcutaneous TID WC Parks Ranger, DO   4 Units at 11/03/21 0831  ? linagliptin (TRADJENTA) tablet 5 mg  5 mg Oral Daily Parks Ranger, DO   5 mg at 11/03/21 5409  ?  multivitamin (RENA-VIT) tablet 1 tablet  1 tablet Oral QHS Parks Ranger, DO   1 tablet at 11/02/21 2106  ? OLANZapine (ZYPREXA) tablet 10 mg  10 mg Oral QHS Parks Ranger, DO   10 mg at 11/02/21 2107  ? ondansetron (ZOFRAN) tablet 4 mg  4 mg Oral Q6H PRN Parks Ranger, DO      ? Or  ? ondansetron (ZOFRAN) injection 4 mg  4 mg Intravenous Q6H PRN Parks Ranger, DO      ? polyethylene glycol (MIRALAX / GLYCOLAX) packet 17 g  17 g Oral QPC breakfast Parks Ranger, DO   17 g at 11/03/21 8119  ? senna-docusate (Senokot-S) tablet 1 tablet  1 tablet Oral QHS Parks Ranger, DO   1 tablet at 11/02/21 2107  ? sorbitol, milk of mag, mineral oil, glycerin (SMOG) enema  960 mL Rectal Daily PRN Parks Ranger, DO   960 mL at 10/19/21 2202  ? traZODone (DESYREL) tablet 100 mg  100 mg Oral QHS Parks Ranger, DO   100 mg at 11/02/21 2107  ? ? ?Lab Results:  ?Results for orders placed or performed during the hospital encounter of 09/29/21 (from the past 48 hour(s))  ?Glucose, capillary     Status: Abnormal  ? Collection Time: 11/01/21 11:57 AM  ?Result Value Ref Range  ? Glucose-Capillary 232 (H) 70 - 99 mg/dL  ?  Comment: Glucose reference range applies only to samples taken after fasting for at least 8 hours.  ?Glucose, capillary     Status: Abnormal  ? Collection Time: 11/01/21  4:35 PM  ?Result Value Ref Range  ? Glucose-Capillary 157 (H) 70 - 99 mg/dL  ?  Comment: Glucose reference range applies only to samples taken after fasting for at least 8 hours.  ?Glucose, capillary     Status: Abnormal  ? Collection Time: 11/02/21  8:03 AM  ?Result Value Ref Range  ? Glucose-Capillary 126 (H) 70 - 99 mg/dL  ?  Comment: Glucose reference range applies only to samples taken after fasting for at least 8 hours.  ?Glucose, capillary     Status: Abnormal  ? Collection Time: 11/02/21 11:44 AM  ?Result Value Ref Range  ? Glucose-Capillary 116 (H) 70 - 99 mg/dL  ?   Comment: Glucose reference range applies only to samples taken after fasting for at least 8 hours.  ?Glucose, capillary     Status: None  ? Collection Time: 11/02/21  4:30 PM  ?Result Value Ref Range  ?  Glucose-Capillary 98 70 - 99 mg/dL  ?  Comment: Glucose reference range applies only to samples taken after fasting for at least 8 hours.  ?Glucose, capillary     Status: Abnormal  ? Collection Time: 11/03/21  8:07 AM  ?Result Value Ref Range  ? Glucose-Capillary 142 (H) 70 - 99 mg/dL  ?  Comment: Glucose reference range applies only to samples taken after fasting for at least 8 hours.  ? ? ?Blood Alcohol level:  ?Lab Results  ?Component Value Date  ? ETH <10 09/20/2021  ? ETH <10 10/13/2020  ? ? ?Metabolic Disorder Labs: ?Lab Results  ?Component Value Date  ? HGBA1C 8.0 (H) 09/27/2021  ? MPG 182.9 09/27/2021  ? MPG 180.03 10/13/2020  ? ?No results found for: PROLACTIN ?Lab Results  ?Component Value Date  ? CHOL 267 (H) 10/13/2020  ? TRIG 180 (H) 10/13/2020  ? HDL 86 10/13/2020  ? CHOLHDL 3.1 10/13/2020  ? VLDL 36 10/13/2020  ? LDLCALC 145 (H) 10/13/2020  ? LDLCALC 158 (H) 02/29/2020  ? ? ?Physical Findings: ?AIMS:  , ,  ,  ,    ?CIWA:    ?COWS:    ? ?Musculoskeletal: ?Strength & Muscle Tone: within normal limits ?Gait & Station: normal ?Patient leans: N/A ? ?Psychiatric Specialty Exam: ? ?Presentation  ?General Appearance: Bizarre; Disheveled ? ?Eye Contact:Fair ? ?Speech:Garbled ? ?Speech Volume:Decreased ? ?Handedness:Right ? ? ?Mood and Affect  ?Mood:No data recorded ?Affect:Flat ? ? ?Thought Process  ?Thought Processes:Disorganized ? ?Descriptions of Associations:Loose ? ?Orientation:Partial ? ?Thought Content:Illogical ? ?History of Schizophrenia/Schizoaffective disorder:Yes ? ?Duration of Psychotic Symptoms:Greater than six months ? ?Hallucinations:No data recorded ?Ideas of Reference:No data recorded ?Suicidal Thoughts:No data recorded ?Homicidal Thoughts:No data recorded ? ?Sensorium  ?Memory:Immediate  Poor ? ?Judgment:Impaired ? ?Insight:Lacking ? ? ?Executive Functions  ?Concentration:Poor ? ?Attention Span:Poor ? ?Recall:Poor ? ?Fund of Knowledge:Poor ? ?Language:Poor ? ? ?Psychomotor Activity  ?Psychomotor Activ

## 2021-11-04 DIAGNOSIS — F250 Schizoaffective disorder, bipolar type: Secondary | ICD-10-CM | POA: Diagnosis not present

## 2021-11-04 LAB — GLUCOSE, CAPILLARY
Glucose-Capillary: 124 mg/dL — ABNORMAL HIGH (ref 70–99)
Glucose-Capillary: 220 mg/dL — ABNORMAL HIGH (ref 70–99)
Glucose-Capillary: 160 mg/dL — ABNORMAL HIGH (ref 70–99)

## 2021-11-04 NOTE — Progress Notes (Signed)
[facility] MD Progress Note ? ?11/04/2021 1:53 PM ?[place]  ?MRN:  [id number] ?Subjective: Patient is seen on rounds.  She met with a family care home this morning.  She states that it went well.  She has been compliant with her medications and denies any side effects.  Overall, she is doing well. ? ?Principal Problem: Schizoaffective disorder, bipolar type ([place]) ?Diagnosis: Principal Problem: ?  Schizoaffective disorder, bipolar type ([place]) ? ?Total Time spent with patient: 15 minutes ? ?Past Psychiatric History: [place] [facility] ? ?Past Medical History:  ?Past Medical History:  ?Diagnosis Date  ? Depression   ? Diabetes mellitus without complication ([place])   ? Diabetes mellitus, type II ([place])   ? Hypertension   ? Schizophrenia ([place])   ? History reviewed. No pertinent surgical history. ?Family History:  ?Family History  ?Problem Relation Age of Onset  ? Diabetes Mother   ? Glaucoma Mother   ? Heart disease Mother   ? Heart attack Mother   ? Depression Mother   ? Hypertension Mother   ? Diabetes Father   ? Glaucoma Father   ? Hypertension Father   ? Diabetes Sister   ? Depression Sister   ? Post-traumatic stress disorder Sister   ? ? ?Social History:  ?Social History  ? ?Substance and Sexual Activity  ?Alcohol Use No  ? Alcohol/week: 0.0 standard drinks  ?   ?Social History  ? ?Substance and Sexual Activity  ?Drug Use No  ?  ?Social History  ? ?Socioeconomic History  ? Marital status: Single  ?  Spouse name: Not on file  ? Number of children: Not on file  ? Years of education: Not on file  ? Highest education level: Not on file  ?Occupational History  ? Not on file  ?Tobacco Use  ? Smoking status: Every Day  ?  Packs/day: 1.00  ?  Types: Cigarettes  ?  Start date: 04/21/1978  ? Smokeless tobacco: Never  ?Vaping Use  ? Vaping Use: Never used  ?Substance and Sexual Activity  ? Alcohol use: No  ?  Alcohol/week: 0.0 standard drinks  ? Drug use: No  ? Sexual activity: Not Currently  ?Other Topics Concern  ? Not  on file  ?Social History Narrative  ? Not on file  ? ?Social Determinants of Health  ? ?Financial Resource Strain: Not on file  ?Food Insecurity: Not on file  ?Transportation Needs: Not on file  ?Physical Activity: Not on file  ?Stress: Not on file  ?Social Connections: Not on file  ? ?Additional Social History:  ?  ?  ?  ?  ?  ?  ?  ?  ?  ?  ?  ? ?Sleep: Good ? ?Appetite:  Good ? ?Current Medications: ?Current Facility-Administered Medications  ?Medication Dose Route Frequency Provider Last Rate Last Admin  ? acetaminophen (TYLENOL) tablet 650 mg  650 mg Oral Q6H PRN Caroline Sauger, NP   650 mg at 11/01/21 1523  ? alum & mag hydroxide-simeth (MAALOX/MYLANTA) 200-200-20 MG/5ML suspension 30 mL  30 mL Oral Q4H PRN Parks Ranger, DO   30 mL at 10/01/21 2215  ? cyclobenzaprine (FLEXERIL) tablet 10 mg  10 mg Oral QHS Parks Ranger, DO   10 mg at 11/03/21 2102  ? diltiazem (CARDIZEM CD) 24 hr capsule 300 mg  300 mg Oral Daily Parks Ranger, DO   300 mg at 11/04/21 7078  ? escitalopram (LEXAPRO) tablet 10 mg  10 mg Oral Daily  Parks Ranger, DO   10 mg at 11/04/21 8309  ? feeding supplement (GLUCERNA SHAKE) (GLUCERNA SHAKE) liquid 237 mL  237 mL Oral TID BM Clapacs, John T, MD   237 mL at 11/04/21 1343  ? Glycerin (Adult) 2 g suppository 1 suppository  1 suppository Rectal Daily PRN Parks Ranger, DO   1 suppository at 10/19/21 1538  ? hydrALAZINE (APRESOLINE) tablet 50 mg  50 mg Oral TID Parks Ranger, DO   50 mg at 11/04/21 1343  ? insulin aspart (novoLOG) injection 4 Units  4 Units Subcutaneous TID WC Parks Ranger, DO   4 Units at 11/04/21 1209  ? linagliptin (TRADJENTA) tablet 5 mg  5 mg Oral Daily Parks Ranger, DO   5 mg at 11/04/21 4076  ? multivitamin (RENA-VIT) tablet 1 tablet  1 tablet Oral QHS Parks Ranger, DO   1 tablet at 11/03/21 2102  ? OLANZapine (ZYPREXA) tablet 10 mg  10 mg Oral QHS Parks Ranger,  DO   10 mg at 11/03/21 2102  ? ondansetron (ZOFRAN) tablet 4 mg  4 mg Oral Q6H PRN Parks Ranger, DO      ? Or  ? ondansetron (ZOFRAN) injection 4 mg  4 mg Intravenous Q6H PRN Parks Ranger, DO      ? polyethylene glycol (MIRALAX / GLYCOLAX) packet 17 g  17 g Oral QPC breakfast Parks Ranger, DO   17 g at 11/03/21 8088  ? senna-docusate (Senokot-S) tablet 1 tablet  1 tablet Oral QHS Parks Ranger, DO   1 tablet at 11/03/21 2102  ? sorbitol, milk of mag, mineral oil, glycerin (SMOG) enema  960 mL Rectal Daily PRN Parks Ranger, DO   960 mL at 10/19/21 2202  ? traZODone (DESYREL) tablet 100 mg  100 mg Oral QHS Parks Ranger, DO   100 mg at 11/03/21 2102  ? ? ?Lab Results:  ?Results for orders placed or performed during the hospital encounter of 09/29/21 (from the past 48 hour(s))  ?Glucose, capillary     Status: None  ? Collection Time: 11/02/21  4:30 PM  ?Result Value Ref Range  ? Glucose-Capillary 98 70 - 99 mg/dL  ?  Comment: Glucose reference range applies only to samples taken after fasting for at least 8 hours.  ?Glucose, capillary     Status: Abnormal  ? Collection Time: 11/03/21  8:07 AM  ?Result Value Ref Range  ? Glucose-Capillary 142 (H) 70 - 99 mg/dL  ?  Comment: Glucose reference range applies only to samples taken after fasting for at least 8 hours.  ?Glucose, capillary     Status: Abnormal  ? Collection Time: 11/03/21 11:58 AM  ?Result Value Ref Range  ? Glucose-Capillary 125 (H) 70 - 99 mg/dL  ?  Comment: Glucose reference range applies only to samples taken after fasting for at least 8 hours.  ?Glucose, capillary     Status: None  ? Collection Time: 11/03/21  1:47 PM  ?Result Value Ref Range  ? Glucose-Capillary 77 70 - 99 mg/dL  ?  Comment: Glucose reference range applies only to samples taken after fasting for at least 8 hours.  ?Glucose, capillary     Status: Abnormal  ? Collection Time: 11/03/21  2:38 PM  ?Result Value Ref Range  ?  Glucose-Capillary 132 (H) 70 - 99 mg/dL  ?  Comment: Glucose reference range applies only to samples taken after fasting for at least 8 hours.  ?  Glucose, capillary     Status: Abnormal  ? Collection Time: 11/03/21  4:27 PM  ?Result Value Ref Range  ? Glucose-Capillary 150 (H) 70 - 99 mg/dL  ?  Comment: Glucose reference range applies only to samples taken after fasting for at least 8 hours.  ?Glucose, capillary     Status: Abnormal  ? Collection Time: 11/04/21  7:47 AM  ?Result Value Ref Range  ? Glucose-Capillary 124 (H) 70 - 99 mg/dL  ?  Comment: Glucose reference range applies only to samples taken after fasting for at least 8 hours.  ?Glucose, capillary     Status: Abnormal  ? Collection Time: 11/04/21 11:52 AM  ?Result Value Ref Range  ? Glucose-Capillary 220 (H) 70 - 99 mg/dL  ?  Comment: Glucose reference range applies only to samples taken after fasting for at least 8 hours.  ? ? ?Blood Alcohol level:  ?Lab Results  ?Component Value Date  ? ETH <10 09/20/2021  ? ETH <10 10/13/2020  ? ? ?Metabolic Disorder Labs: ?Lab Results  ?Component Value Date  ? HGBA1C 8.0 (H) 09/27/2021  ? MPG 182.9 09/27/2021  ? MPG 180.03 10/13/2020  ? ?No results found for: PROLACTIN ?Lab Results  ?Component Value Date  ? CHOL 267 (H) 10/13/2020  ? TRIG 180 (H) 10/13/2020  ? HDL 86 10/13/2020  ? CHOLHDL 3.1 10/13/2020  ? VLDL 36 10/13/2020  ? LDLCALC 145 (H) 10/13/2020  ? LDLCALC 158 (H) 02/29/2020  ? ? ?Physical Findings: ?AIMS:  , ,  ,  ,    ?CIWA:    ?COWS:    ? ?Musculoskeletal: ?Strength & Muscle Tone: within normal limits ?Gait & Station: normal ?Patient leans: N/A ? ?Psychiatric Specialty Exam: ? ?Presentation  ?General Appearance: Bizarre; Disheveled ? ?Eye Contact:Fair ? ?Speech:Garbled ? ?Speech Volume:Decreased ? ?Handedness:Right ? ? ?Mood and Affect  ?Mood:No data recorded ?Affect:Flat ? ? ?Thought Process  ?Thought Processes:Disorganized ? ?Descriptions of Associations:Loose ? ?Orientation:Partial ? ?Thought  Content:Illogical ? ?History of Schizophrenia/Schizoaffective disorder:Yes ? ?Duration of Psychotic Symptoms:Greater than six months ? ?Hallucinations:No data recorded ?Ideas of Reference:No data recorded ?Suicidal Thou

## 2021-11-04 NOTE — BHH Counselor (Signed)
CSW met with pt and Joy from [facility], [phone] to decide on placement fit.  ? ?Caryl Asp stated that they would like to continue with placement and that pt would need TB and Covid testing done before she could be placed at the home along with additional medical notes and financial information.   ? ?CSW provided pt's sister's [name] Stoke's contact information, [phone], to assist with financial information that pt was unable to provide.   ? ?CSW will be in contact with Joy to facilitate finalizing placement process.  ? ?Bennye Nix [place], MSW, LCSW-A ?4/5/202310:12 AM  ?

## 2021-11-04 NOTE — Progress Notes (Signed)
Patient presents A&O x2 with flat and blunted affect.  Speech soft with delayed response.  Denies AVH, SI or HI.  Pt c/o left hand discomfort "hurst a little".  Noted  2+ non pitting edema to left hand.  Declines Tylenol, elevated hand on pillow.  Compliant with all evening meds.  Patient spent most of the evening in her bed sleeping except during snack time.  Ambulating independent with steady gait.  Denies needs or complaints.  Will continue to monitor with Q 15 min safey checks ?

## 2021-11-04 NOTE — Progress Notes (Signed)
Patient is calm and cooperative with assessment. She denies SI, HI, and AVH. Patient also denies pain and other physical problems. Patient is compliant with medications. Patient remains safe on the unit at this time. ? ?Patient's CBG orders clarified with Dr. Louis Meckel. Bedtime CBG check discontinued.  ?

## 2021-11-04 NOTE — BH IP Treatment Plan (Signed)
Interdisciplinary Treatment and Diagnostic Plan Update ? ?11/04/2021 ?Time of Session: 9:30AM ?[place] ?MRN: [id number] ? ?Principal Diagnosis: Schizoaffective disorder, bipolar type ([place]) ? ?Secondary Diagnoses: Principal Problem: ?  Schizoaffective disorder, bipolar type ([place]) ? ? ?Current Medications:  ?Current Facility-Administered Medications  ?Medication Dose Route Frequency Provider Last Rate Last Admin  ? acetaminophen (TYLENOL) tablet 650 mg  650 mg Oral Q6H PRN Caroline Sauger, NP   650 mg at 11/01/21 1523  ? alum & mag hydroxide-simeth (MAALOX/MYLANTA) 200-200-20 MG/5ML suspension 30 mL  30 mL Oral Q4H PRN Parks Ranger, DO   30 mL at 10/01/21 2215  ? cyclobenzaprine (FLEXERIL) tablet 10 mg  10 mg Oral QHS Parks Ranger, DO   10 mg at 11/03/21 2102  ? diltiazem (CARDIZEM CD) 24 hr capsule 300 mg  300 mg Oral Daily Parks Ranger, DO   300 mg at 11/04/21 3244  ? escitalopram (LEXAPRO) tablet 10 mg  10 mg Oral Daily Parks Ranger, DO   10 mg at 11/04/21 0102  ? feeding supplement (GLUCERNA SHAKE) (GLUCERNA SHAKE) liquid 237 mL  237 mL Oral TID BM Clapacs, John T, MD   237 mL at 11/04/21 1343  ? Glycerin (Adult) 2 g suppository 1 suppository  1 suppository Rectal Daily PRN Parks Ranger, DO   1 suppository at 10/19/21 1538  ? hydrALAZINE (APRESOLINE) tablet 50 mg  50 mg Oral TID Parks Ranger, DO   50 mg at 11/04/21 1343  ? insulin aspart (novoLOG) injection 4 Units  4 Units Subcutaneous TID WC Parks Ranger, DO   4 Units at 11/04/21 1209  ? linagliptin (TRADJENTA) tablet 5 mg  5 mg Oral Daily Parks Ranger, DO   5 mg at 11/04/21 7253  ? multivitamin (RENA-VIT) tablet 1 tablet  1 tablet Oral QHS Parks Ranger, DO   1 tablet at 11/03/21 2102  ? OLANZapine (ZYPREXA) tablet 10 mg  10 mg Oral QHS Parks Ranger, DO   10 mg at 11/03/21 2102  ? ondansetron (ZOFRAN) tablet 4 mg  4 mg Oral Q6H PRN Parks Ranger, DO      ? Or  ? ondansetron (ZOFRAN) injection 4 mg  4 mg Intravenous Q6H PRN Parks Ranger, DO      ? polyethylene glycol (MIRALAX / GLYCOLAX) packet 17 g  17 g Oral QPC breakfast Parks Ranger, DO   17 g at 11/03/21 6644  ? senna-docusate (Senokot-S) tablet 1 tablet  1 tablet Oral QHS Parks Ranger, DO   1 tablet at 11/03/21 2102  ? sorbitol, milk of mag, mineral oil, glycerin (SMOG) enema  960 mL Rectal Daily PRN Parks Ranger, DO   960 mL at 10/19/21 2202  ? traZODone (DESYREL) tablet 100 mg  100 mg Oral QHS Parks Ranger, DO   100 mg at 11/03/21 2102  ? ?PTA Medications: ?Medications Prior to Admission  ?Medication Sig Dispense Refill Last Dose  ? amLODipine (NORVASC) 10 MG tablet Take 1 tablet (10 mg total) by mouth daily.     ? [EXPIRED] cefdinir (OMNICEF) 300 MG capsule Take 1 capsule (300 mg total) by mouth daily for 3 days. 3 capsule 0   ? hydrALAZINE (APRESOLINE) 50 MG tablet Take 1 tablet (50 mg total) by mouth 3 (three) times daily.     ? hydrochlorothiazide (HYDRODIURIL) 12.5 MG tablet Take 1 tablet (12.5 mg total) by mouth daily.     ? ? ?Patient  Stressors: Health problems   ? ?Patient Strengths: Religious Affiliation  ? ?Treatment Modalities: Medication Management, Group therapy, Case management,  ?1 to 1 session with clinician, Psychoeducation, Recreational therapy. ? ? ?Physician Treatment Plan for Primary Diagnosis: Schizoaffective disorder, bipolar type ([place]) ?Long Term Goal(s): Improvement in symptoms so as ready for discharge  ? ?Short Term Goals: Ability to identify changes in lifestyle to reduce recurrence of condition will improve ?Ability to verbalize feelings will improve ?Ability to disclose and discuss suicidal ideas ?Ability to demonstrate self-control will improve ?Ability to identify and develop effective coping behaviors will improve ?Ability to maintain clinical measurements within normal limits will  improve ?Compliance with prescribed medications will improve ?Ability to identify triggers associated with substance abuse/mental health issues will improve ? ?Medication Management: Evaluate patient's response, side effects, and tolerance of medication regimen. ? ?Therapeutic Interventions: 1 to 1 sessions, Unit Group sessions and Medication administration. ? ?Evaluation of Outcomes: Adequate for Discharge ? ?Physician Treatment Plan for Secondary Diagnosis: Principal Problem: ?  Schizoaffective disorder, bipolar type ([place]) ? ?Long Term Goal(s): Improvement in symptoms so as ready for discharge  ? ?Short Term Goals: Ability to identify changes in lifestyle to reduce recurrence of condition will improve ?Ability to verbalize feelings will improve ?Ability to disclose and discuss suicidal ideas ?Ability to demonstrate self-control will improve ?Ability to identify and develop effective coping behaviors will improve ?Ability to maintain clinical measurements within normal limits will improve ?Compliance with prescribed medications will improve ?Ability to identify triggers associated with substance abuse/mental health issues will improve    ? ?Medication Management: Evaluate patient's response, side effects, and tolerance of medication regimen. ? ?Therapeutic Interventions: 1 to 1 sessions, Unit Group sessions and Medication administration. ? ?Evaluation of Outcomes: Adequate for Discharge ? ? ?RN Treatment Plan for Primary Diagnosis: Schizoaffective disorder, bipolar type ([place]) ?Long Term Goal(s): Knowledge of disease and therapeutic regimen to maintain health will improve ? ?Short Term Goals: Ability to remain free from injury will improve, Ability to verbalize frustration and anger appropriately will improve, Ability to demonstrate self-control, Ability to participate in decision making will improve, Ability to verbalize feelings will improve, Ability to identify and develop effective coping behaviors will improve,  and Compliance with prescribed medications will improve ? ?Medication Management: RN will administer medications as ordered by provider, will assess and evaluate patient's response and provide education to patient for prescribed medication. RN will report any adverse and/or side effects to prescribing provider. ? ?Therapeutic Interventions: 1 on 1 counseling sessions, Psychoeducation, Medication administration, Evaluate responses to treatment, Monitor vital signs and CBGs as ordered, Perform/monitor CIWA, COWS, AIMS and Fall Risk screenings as ordered, Perform wound care treatments as ordered. ? ?Evaluation of Outcomes: Adequate for Discharge ? ? ?LCSW Treatment Plan for Primary Diagnosis: Schizoaffective disorder, bipolar type ([employer]) ?Long Term Goal(s): Safe transition to appropriate next level of care at discharge, Engage patient in therapeutic group addressing interpersonal concerns. ? ?Short Term Goals: Engage patient in aftercare planning with referrals and resources, Increase social support, Increase ability to appropriately verbalize feelings, Increase emotional regulation, Facilitate acceptance of mental health diagnosis and concerns, Facilitate patient progression through stages of change regarding substance use diagnoses and concerns, and Increase skills for wellness and recovery ? ?Therapeutic Interventions: Assess for all discharge needs, 1 to 1 time with Education officer, museum, Explore available resources and support systems, Assess for adequacy in community support network, Educate family and significant other(s) on suicide prevention, Complete Psychosocial Assessment, Interpersonal group therapy. ? ?Evaluation of Outcomes:  Adequate for Discharge ? ? ?Progress in Treatment: ?Attending groups: Yes. ?Participating in groups: Yes. ?Taking medication as prescribed: Yes. ?Toleration medication: Yes. ?Family/Significant other contact made: Yes, individual(s) contacted:  [name], pt's sister, SPE completed ?Patient  understands diagnosis: Yes. ?Discussing patient identified problems/goals with staff: Yes. ?Medical problems stabilized or resolved: Yes. ?Denies suicidal/homicidal ideation: Yes. ?Issues/concerns per patient self-in

## 2021-11-05 DIAGNOSIS — F250 Schizoaffective disorder, bipolar type: Secondary | ICD-10-CM | POA: Diagnosis not present

## 2021-11-05 LAB — GLUCOSE, CAPILLARY
Glucose-Capillary: 137 mg/dL — ABNORMAL HIGH (ref 70–99)
Glucose-Capillary: 185 mg/dL — ABNORMAL HIGH (ref 70–99)
Glucose-Capillary: 123 mg/dL — ABNORMAL HIGH (ref 70–99)

## 2021-11-05 NOTE — Progress Notes (Signed)
[facility] MD Progress Note ? ?11/05/2021 10:55 AM ?[place]  ?MRN:  [id number] ?Subjective: Patient is seen on rounds.  Alicia Rogers is doing well.  Alicia Rogers smiles when I told her that Alicia Rogers has been accepted to a care facility.  Alicia Rogers is pleasant and cooperative.  Alicia Rogers is sleeping well and her appetite is good.  Alicia Rogers take her medications as prescribed and denies any side effects. ? ?Principal Problem: Schizoaffective disorder, bipolar type ([place]) ?Diagnosis: Principal Problem: ?  Schizoaffective disorder, bipolar type ([place]) ? ?Total Time spent with patient: 15 minutes ? ?Past Psychiatric History: [facility] ? ?Past Medical History:  ?Past Medical History:  ?Diagnosis Date  ? Depression   ? Diabetes mellitus without complication ([place])   ? Diabetes mellitus, type II ([place])   ? Hypertension   ? Schizophrenia ([place])   ? History reviewed. No pertinent surgical history. ?Family History:  ?Family History  ?Problem Relation Age of Onset  ? Diabetes Mother   ? Glaucoma Mother   ? Heart disease Mother   ? Heart attack Mother   ? Depression Mother   ? Hypertension Mother   ? Diabetes Father   ? Glaucoma Father   ? Hypertension Father   ? Diabetes Sister   ? Depression Sister   ? Post-traumatic stress disorder Sister   ? ? ?Social History:  ?Social History  ? ?Substance and Sexual Activity  ?Alcohol Use No  ? Alcohol/week: 0.0 standard drinks  ?   ?Social History  ? ?Substance and Sexual Activity  ?Drug Use No  ?  ?Social History  ? ?Socioeconomic History  ? Marital status: Single  ?  Spouse name: Not on file  ? Number of children: Not on file  ? Years of education: Not on file  ? Highest education level: Not on file  ?Occupational History  ? Not on file  ?Tobacco Use  ? Smoking status: Every Day  ?  Packs/day: 1.00  ?  Types: Cigarettes  ?  Start date: 04/21/1978  ? Smokeless tobacco: Never  ?Vaping Use  ? Vaping Use: Never used  ?Substance and Sexual Activity  ? Alcohol use: No  ?  Alcohol/week: 0.0 standard drinks  ? Drug use: No   ? Sexual activity: Not Currently  ?Other Topics Concern  ? Not on file  ?Social History Narrative  ? Not on file  ? ?Social Determinants of Health  ? ?Financial Resource Strain: Not on file  ?Food Insecurity: Not on file  ?Transportation Needs: Not on file  ?Physical Activity: Not on file  ?Stress: Not on file  ?Social Connections: Not on file  ? ?Additional Social History:  ?  ?  ?  ?  ?  ?  ?  ?  ?  ?  ?  ? ?Sleep: Good ? ?Appetite:  Good ? ?Current Medications: ?Current Facility-Administered Medications  ?Medication Dose Route Frequency Provider Last Rate Last Admin  ? acetaminophen (TYLENOL) tablet 650 mg  650 mg Oral Q6H PRN Caroline Sauger, NP   650 mg at 11/01/21 1523  ? alum & mag hydroxide-simeth (MAALOX/MYLANTA) 200-200-20 MG/5ML suspension 30 mL  30 mL Oral Q4H PRN Parks Ranger, DO   30 mL at 10/01/21 2215  ? cyclobenzaprine (FLEXERIL) tablet 10 mg  10 mg Oral QHS Parks Ranger, DO   10 mg at 11/03/21 2102  ? diltiazem (CARDIZEM CD) 24 hr capsule 300 mg  300 mg Oral Daily Parks Ranger, DO   300 mg at 11/05/21  4010  ? escitalopram (LEXAPRO) tablet 10 mg  10 mg Oral Daily Parks Ranger, DO   10 mg at 11/05/21 2725  ? feeding supplement (GLUCERNA SHAKE) (GLUCERNA SHAKE) liquid 237 mL  237 mL Oral TID BM Clapacs, Madie [place]o, MD   237 mL at 11/05/21 0901  ? Glycerin (Adult) 2 g suppository 1 suppository  1 suppository Rectal Daily PRN Parks Ranger, DO   1 suppository at 10/19/21 1538  ? hydrALAZINE (APRESOLINE) tablet 50 mg  50 mg Oral TID Parks Ranger, DO   50 mg at 11/05/21 3664  ? insulin aspart (novoLOG) injection 4 Units  4 Units Subcutaneous TID WC Parks Ranger, DO   4 Units at 11/05/21 4034  ? linagliptin (TRADJENTA) tablet 5 mg  5 mg Oral Daily Parks Ranger, DO   5 mg at 11/05/21 7425  ? multivitamin (RENA-VIT) tablet 1 tablet  1 tablet Oral QHS Parks Ranger, DO   1 tablet at 11/04/21 2115  ? OLANZapine  (ZYPREXA) tablet 10 mg  10 mg Oral QHS Parks Ranger, DO   10 mg at 11/04/21 2115  ? ondansetron (ZOFRAN) tablet 4 mg  4 mg Oral Q6H PRN Parks Ranger, DO      ? Or  ? ondansetron (ZOFRAN) injection 4 mg  4 mg Intravenous Q6H PRN Parks Ranger, DO      ? polyethylene glycol (MIRALAX / GLYCOLAX) packet 17 g  17 g Oral QPC breakfast Parks Ranger, DO   17 g at 11/03/21 9563  ? senna-docusate (Senokot-S) tablet 1 tablet  1 tablet Oral QHS Parks Ranger, DO   1 tablet at 11/04/21 2115  ? sorbitol, milk of mag, mineral oil, glycerin (SMOG) enema  960 mL Rectal Daily PRN Parks Ranger, DO   960 mL at 10/19/21 2202  ? traZODone (DESYREL) tablet 100 mg  100 mg Oral QHS Parks Ranger, DO   100 mg at 11/04/21 2116  ? ? ?Lab Results:  ?Results for orders placed or performed during the hospital encounter of 09/29/21 (from the past 48 hour(s))  ?Glucose, capillary     Status: Abnormal  ? Collection Time: 11/03/21 11:58 AM  ?Result Value Ref Range  ? Glucose-Capillary 125 (H) 70 - 99 mg/dL  ?  Comment: Glucose reference range applies only to samples taken after fasting for at least 8 hours.  ?Glucose, capillary     Status: None  ? Collection Time: 11/03/21  1:47 PM  ?Result Value Ref Range  ? Glucose-Capillary 77 70 - 99 mg/dL  ?  Comment: Glucose reference range applies only to samples taken after fasting for at least 8 hours.  ?Glucose, capillary     Status: Abnormal  ? Collection Time: 11/03/21  2:38 PM  ?Result Value Ref Range  ? Glucose-Capillary 132 (H) 70 - 99 mg/dL  ?  Comment: Glucose reference range applies only to samples taken after fasting for at least 8 hours.  ?Glucose, capillary     Status: Abnormal  ? Collection Time: 11/03/21  4:27 PM  ?Result Value Ref Range  ? Glucose-Capillary 150 (H) 70 - 99 mg/dL  ?  Comment: Glucose reference range applies only to samples taken after fasting for at least 8 hours.  ?Glucose, capillary     Status: Abnormal   ? Collection Time: 11/04/21  7:47 AM  ?Result Value Ref Range  ? Glucose-Capillary 124 (H) 70 - 99 mg/dL  ?  Comment: Glucose  reference range applies only to samples taken after fasting for at least 8 hours.  ?Glucose, capillary     Status: Abnormal  ? Collection Time: 11/04/21 11:52 AM  ?Result Value Ref Range  ? Glucose-Capillary 220 (H) 70 - 99 mg/dL  ?  Comment: Glucose reference range applies only to samples taken after fasting for at least 8 hours.  ?Glucose, capillary     Status: Abnormal  ? Collection Time: 11/04/21  4:21 PM  ?Result Value Ref Range  ? Glucose-Capillary 160 (H) 70 - 99 mg/dL  ?  Comment: Glucose reference range applies only to samples taken after fasting for at least 8 hours.  ?Glucose, capillary     Status: Abnormal  ? Collection Time: 11/05/21  7:42 AM  ?Result Value Ref Range  ? Glucose-Capillary 137 (H) 70 - 99 mg/dL  ?  Comment: Glucose reference range applies only to samples taken after fasting for at least 8 hours.  ? ? ?Blood Alcohol level:  ?Lab Results  ?Component Value Date  ? ETH <10 09/20/2021  ? ETH <10 10/13/2020  ? ? ?Metabolic Disorder Labs: ?Lab Results  ?Component Value Date  ? HGBA1C 8.0 (H) 09/27/2021  ? MPG 182.9 09/27/2021  ? MPG 180.03 10/13/2020  ? ?No results found for: PROLACTIN ?Lab Results  ?Component Value Date  ? CHOL 267 (H) 10/13/2020  ? TRIG 180 (H) 10/13/2020  ? HDL 86 10/13/2020  ? CHOLHDL 3.1 10/13/2020  ? VLDL 36 10/13/2020  ? LDLCALC 145 (H) 10/13/2020  ? LDLCALC 158 (H) 02/29/2020  ? ? ?Physical Findings: ?AIMS:  , ,  ,  ,    ?CIWA:    ?COWS:    ? ?Musculoskeletal: ?Strength & Muscle Tone: within normal limits ?Gait & Station: normal ?Patient leans: N/A ? ?Psychiatric Specialty Exam: ? ?Presentation  ?General Appearance: Bizarre; Disheveled ? ?Eye Contact:Fair ? ?Speech:Garbled ? ?Speech Volume:Decreased ? ?Handedness:Right ? ? ?Mood and Affect  ?Mood:No data recorded ?Affect:Flat ? ? ?Thought Process  ?Thought Processes:Disorganized ? ?Descriptions  of Associations:Loose ? ?Orientation:Partial ? ?Thought Content:Illogical ? ?History of Schizophrenia/Schizoaffective disorder:Yes ? ?Duration of Psychotic Symptoms:Greater than six months ? ?Hallucin

## 2021-11-05 NOTE — B[facility] Group Notes (Signed)
[place] Group Notes:  (Nursing/MHT/Case Management/Adjunct) ? ?Date:  11/04/2021 ?Time:  10:00 AM ? ?Type of Therapy:  Psychoeducational Skills ? ?Participation Level:  Active ? ?Participation Quality:  Appropriate ? ?Affect:  Appropriate ? ?Cognitive:  Appropriate ? ?Insight:  Appropriate ? ?Engagement in Group:  Engaged ? ?Modes of Intervention:  Discussion ? ?Summary of Progress/Problems: ?The pt attended group and participated in the activity, as well as shared during discussion. ?Alicia Rogers ?11/05/2021, 11:03 AM ?

## 2021-11-05 NOTE — Progress Notes (Signed)
Recreation Therapy Notes ? ?Date: 11/05/2021 ? ?Time: 10:00 am  ? ?Location: Day room    ? ?Behavioral response: Appropriate  ? ?Intervention Topic: Animal Assisted Therapy  ? ?Discussion/Intervention:  ?Animal Assisted Therapy took place today during group.  Animal Assisted Therapy is the planned inclusion of an animal in a patient's treatment plan. The patients were able to engage in therapy with an animal during group. Participants were educated on what a service dog is and the different between a support dog and a service dog. Patient were informed on how animal needs are like a person needs. Individuals were enlightened on the process to get a service animal or support animal. Patients got the opportunity to pet the animal and were offered emotional support from the animal and staff.  ?Clinical Observations/Feedback:  ?Patient came to group and was on topic and was focused on what peers and staff had to say. Participant shared their experiences and history with animals. Individual was social with peers, staff and animal while participating in group.  ?Tailer Volkert LRT/CTRS  ? ? ? ? ? ? ?Lason Eveland ?11/05/2021 12:58 PM ? ? ? ? ? ? ? ?Easter Schinke ?11/05/2021 12:58 PM ?

## 2021-11-05 NOTE — Group Note (Signed)
[place] LCSW Group Therapy Note ? ? ?Group Date: 11/05/2021 ?Start Time: 1415 ?End Time: 7416 ? ? ?Type of Therapy/Topic:  Group Therapy:  Emotion Regulation ? ?Participation Level:  Active  ? ? ? ?Description of Group:   ? The purpose of this group is to assist patients in learning to regulate negative emotions and experience positive emotions. Patients will be guided to discuss ways in which they have been vulnerable to their negative emotions. These vulnerabilities will be juxtaposed with experiences of positive emotions or situations, and patients challenged to use positive emotions to combat negative ones. Special emphasis will be placed on coping with negative emotions in conflict situations, and patients will process healthy conflict resolution skills. ? ?Therapeutic Goals: ?Patient will identify two positive emotions or experiences to reflect on in order to balance out negative emotions:  ?Patient will label two or more emotions that they find the most difficult to experience:  ?Patient will be able to demonstrate positive conflict resolution skills through discussion or role plays:  ? ?Summary of Patient Progress: ? ? ?Patient was present for the entirety of the group session. Patient was an active listener and participated in the topic of discussion, provided helpful advice to others, and added nuance to topic of conversation. CSW led patients through emotional regulation activity  to facilitate discussion and illicit thoughts and feelings regarding topic. She stated that a time when she was happiest was "Christmas morning when both of my parents were alive." She also said that she was interested in losing her desire for smoking as one habit she'd like to change.  ? ? ? ? ?Therapeutic Modalities:   ?Cognitive Behavioral Therapy ?Feelings Identification ?Dialectical Behavioral Therapy ? ? ?[name] Vieth A [place], LCSWA ?

## 2021-11-05 NOTE — Progress Notes (Signed)
Patient denies SI, HI, and AVH. She initially denies pain in the morning, but says that her left hand began to hurt throughout the day. Tylenol PRN was given, but patient states that it did not help. Patient is compliant with scheduled medications. Patient is active in the milieu and participated in group. Patient remains safe on the unit at this time.  ?

## 2021-11-05 NOTE — Progress Notes (Signed)
The patient has slept well during this shift. She was compliant with her medications. I observed the patient interacting with her peers in the day room watching a movie on TV. She did try to make a phone call to her sister, with no answer. The patient denies SI/HI AVH. Will continue to monitor and provide support.  ? ?Ivonne Andrew, RN ? ?

## 2021-11-06 LAB — GLUCOSE, CAPILLARY
Glucose-Capillary: 141 mg/dL — ABNORMAL HIGH (ref 70–99)
Glucose-Capillary: 112 mg/dL — ABNORMAL HIGH (ref 70–99)
Glucose-Capillary: 128 mg/dL — ABNORMAL HIGH (ref 70–99)

## 2021-11-06 MED ORDER — TUBERCULIN PPD 5 UNIT/0.1ML ID SOLN
5.0000 [IU] | Freq: Once | INTRADERMAL | Status: AC
  Administered 2021-11-06: 5 [IU] via INTRADERMAL
  Filled 2021-11-06: qty 0.1

## 2021-11-06 NOTE — Progress Notes (Signed)
[facility] MD Progress Note ? ?11/06/2021 11:30 AM ?[place]  ?MRN:  [id number] ?Subjective: Patient is seen on rounds today.  She has been compliant with her medications.  She says that she is doing fine.  She has been accepted to a personal care home.  We will place a TB test today to be read on Monday.  No side effects from her medications. ? ?Principal Problem: Schizoaffective disorder, bipolar type ([place]) ?Diagnosis: Principal Problem: ?  Schizoaffective disorder, bipolar type ([place]) ? ?Total Time spent with patient: 15 minutes ? ?Past Psychiatric History: Schizophrenia ? ?Past Medical History:  ?Past Medical History:  ?Diagnosis Date  ? Depression   ? Diabetes mellitus without complication ([place])   ? Diabetes mellitus, type II ([place])   ? Hypertension   ? Schizophrenia ([place])   ? History reviewed. No pertinent surgical history. ?Family History:  ?Family History  ?Problem Relation Age of Onset  ? Diabetes Mother   ? Glaucoma Mother   ? Heart disease Mother   ? Heart attack Mother   ? Depression Mother   ? Hypertension Mother   ? Diabetes Father   ? Glaucoma Father   ? Hypertension Father   ? Diabetes Sister   ? Depression Sister   ? Post-traumatic stress disorder Sister   ? ? ?Social History:  ?Social History  ? ?Substance and Sexual Activity  ?Alcohol Use No  ? Alcohol/week: 0.0 standard drinks  ?   ?Social History  ? ?Substance and Sexual Activity  ?Drug Use No  ?  ?Social History  ? ?Socioeconomic History  ? Marital status: Single  ?  Spouse name: Not on file  ? Number of children: Not on file  ? Years of education: Not on file  ? Highest education level: Not on file  ?Occupational History  ? Not on file  ?Tobacco Use  ? Smoking status: Every Day  ?  Packs/day: 1.00  ?  Types: Cigarettes  ?  Start date: 04/21/1978  ? Smokeless tobacco: Never  ?Vaping Use  ? Vaping Use: Never used  ?Substance and Sexual Activity  ? Alcohol use: No  ?  Alcohol/week: 0.0 standard drinks  ? Drug use: No  ? Sexual activity: Not  Currently  ?Other Topics Concern  ? Not on file  ?Social History Narrative  ? Not on file  ? ?Social Determinants of Health  ? ?Financial Resource Strain: Not on file  ?Food Insecurity: Not on file  ?Transportation Needs: Not on file  ?Physical Activity: Not on file  ?Stress: Not on file  ?Social Connections: Not on file  ? ?Additional Social History:  ?  ?  ?  ?  ?  ?  ?  ?  ?  ?  ?  ? ?Sleep: Good ? ?Appetite:  Good ? ?Current Medications: ?Current Facility-Administered Medications  ?Medication Dose Route Frequency Provider Last Rate Last Admin  ? acetaminophen (TYLENOL) tablet 650 mg  650 mg Oral Q6H PRN Caroline Sauger, NP   650 mg at 11/05/21 1446  ? alum & mag hydroxide-simeth (MAALOX/MYLANTA) 200-200-20 MG/5ML suspension 30 mL  30 mL Oral Q4H PRN Parks Ranger, DO   30 mL at 10/01/21 2215  ? cyclobenzaprine (FLEXERIL) tablet 10 mg  10 mg Oral QHS Parks Ranger, DO   10 mg at 11/05/21 2115  ? diltiazem (CARDIZEM CD) 24 hr capsule 300 mg  300 mg Oral Daily Parks Ranger, DO   300 mg at 11/06/21 1856  ? escitalopram (  LEXAPRO) tablet 10 mg  10 mg Oral Daily Parks Ranger, DO   10 mg at 11/06/21 5366  ? feeding supplement (GLUCERNA SHAKE) (GLUCERNA SHAKE) liquid 237 mL  237 mL Oral TID BM Clapacs, Madie Reno, MD   237 mL at 11/06/21 0837  ? Glycerin (Adult) 2 g suppository 1 suppository  1 suppository Rectal Daily PRN Parks Ranger, DO   1 suppository at 10/19/21 1538  ? hydrALAZINE (APRESOLINE) tablet 50 mg  50 mg Oral TID Parks Ranger, DO   50 mg at 11/06/21 0809  ? insulin aspart (novoLOG) injection 4 Units  4 Units Subcutaneous TID WC Parks Ranger, DO   4 Units at 11/06/21 0809  ? linagliptin (TRADJENTA) tablet 5 mg  5 mg Oral Daily Parks Ranger, DO   5 mg at 11/06/21 4403  ? multivitamin (RENA-VIT) tablet 1 tablet  1 tablet Oral QHS Parks Ranger, DO   1 tablet at 11/05/21 2115  ? OLANZapine (ZYPREXA) tablet 10 mg   10 mg Oral QHS Parks Ranger, DO   10 mg at 11/05/21 2114  ? ondansetron (ZOFRAN) tablet 4 mg  4 mg Oral Q6H PRN Parks Ranger, DO      ? Or  ? ondansetron (ZOFRAN) injection 4 mg  4 mg Intravenous Q6H PRN Parks Ranger, DO      ? polyethylene glycol (MIRALAX / GLYCOLAX) packet 17 g  17 g Oral QPC breakfast Parks Ranger, DO   17 g at 11/03/21 4742  ? senna-docusate (Senokot-S) tablet 1 tablet  1 tablet Oral QHS Parks Ranger, DO   1 tablet at 11/05/21 2115  ? sorbitol, milk of mag, mineral oil, glycerin (SMOG) enema  960 mL Rectal Daily PRN Parks Ranger, DO   960 mL at 10/19/21 2202  ? traZODone (DESYREL) tablet 100 mg  100 mg Oral QHS Parks Ranger, DO   100 mg at 11/05/21 2115  ? tuberculin injection 5 Units  5 Units Intradermal Once Parks Ranger, DO      ? ? ?Lab Results:  ?Results for orders placed or performed during the hospital encounter of 09/29/21 (from the past 48 hour(s))  ?Glucose, capillary     Status: Abnormal  ? Collection Time: 11/04/21 11:52 AM  ?Result Value Ref Range  ? Glucose-Capillary 220 (H) 70 - 99 mg/dL  ?  Comment: Glucose reference range applies only to samples taken after fasting for at least 8 hours.  ?Glucose, capillary     Status: Abnormal  ? Collection Time: 11/04/21  4:21 PM  ?Result Value Ref Range  ? Glucose-Capillary 160 (H) 70 - 99 mg/dL  ?  Comment: Glucose reference range applies only to samples taken after fasting for at least 8 hours.  ?Glucose, capillary     Status: Abnormal  ? Collection Time: 11/05/21  7:42 AM  ?Result Value Ref Range  ? Glucose-Capillary 137 (H) 70 - 99 mg/dL  ?  Comment: Glucose reference range applies only to samples taken after fasting for at least 8 hours.  ?Glucose, capillary     Status: Abnormal  ? Collection Time: 11/05/21 11:35 AM  ?Result Value Ref Range  ? Glucose-Capillary 185 (H) 70 - 99 mg/dL  ?  Comment: Glucose reference range applies only to samples taken  after fasting for at least 8 hours.  ?Glucose, capillary     Status: Abnormal  ? Collection Time: 11/05/21  4:30 PM  ?Result Value Ref  Range  ? Glucose-Capillary 123 (H) 70 - 99 mg/dL  ?  Comment: Glucose reference range applies only to samples taken after fasting for at least 8 hours.  ?Glucose, capillary     Status: Abnormal  ? Collection Time: 11/06/21  7:38 AM  ?Result Value Ref Range  ? Glucose-Capillary 141 (H) 70 - 99 mg/dL  ?  Comment: Glucose reference range applies only to samples taken after fasting for at least 8 hours.  ? ? ?Blood Alcohol level:  ?Lab Results  ?Component Value Date  ? ETH <10 09/20/2021  ? ETH <10 10/13/2020  ? ? ?Metabolic Disorder Labs: ?Lab Results  ?Component Value Date  ? HGBA1C 8.0 (H) 09/27/2021  ? MPG 182.9 09/27/2021  ? MPG 180.03 10/13/2020  ? ?No results found for: PROLACTIN ?Lab Results  ?Component Value Date  ? CHOL 267 (H) 10/13/2020  ? TRIG 180 (H) 10/13/2020  ? HDL 86 10/13/2020  ? CHOLHDL 3.1 10/13/2020  ? VLDL 36 10/13/2020  ? LDLCALC 145 (H) 10/13/2020  ? LDLCALC 158 (H) 02/29/2020  ? ? ?Physical Findings: ?AIMS:  , ,  ,  ,    ?CIWA:    ?COWS:    ? ?Musculoskeletal: ?Strength & Muscle Tone: within normal limits ?Gait & Station: normal ?Patient leans: N/A ? ?Psychiatric Specialty Exam: ? ?Presentation  ?General Appearance: Bizarre; Disheveled ? ?Eye Contact:Fair ? ?Speech:Garbled ? ?Speech Volume:Decreased ? ?Handedness:Right ? ? ?Mood and Affect  ?Mood:No data recorded ?Affect:Flat ? ? ?Thought Process  ?Thought Processes:Disorganized ? ?Descriptions of Associations:Loose ? ?Orientation:Partial ? ?Thought Content:Illogical ? ?History of Schizophrenia/Schizoaffective disorder:Yes ? ?Duration of Psychotic Symptoms:Greater than six months ? ?Hallucinations:No data recorded ?Ideas of Reference:No data recorded ?Suicidal Thoughts:No data recorded ?Homicidal Thoughts:No data recorded ? ?Sensorium  ?Memory:Immediate Poor ? ?Judgment:Impaired ? ?Insight:Lacking ? ? ?Executive  Functions  ?Concentration:Poor ? ?Attention Span:Poor ? ?Recall:Poor ? ?Fund of Knowledge:Poor ? ?Language:Poor ? ? ?Psychomotor Activity  ?Psychomotor Activity:No data recorded ? ?Assets  ?Assets:Housing; Soci

## 2021-11-06 NOTE — Progress Notes (Signed)
Patient is seen in her bedroom sleeping at the beginning of the shift. She is medication compliant, calm, and cooperative. She denies SI, HI, AVH, anxiety, and depression. She voiced no complaints. She is safe on the unit at this time. Q 15 minute safety checks in place.  ?

## 2021-11-06 NOTE — Progress Notes (Signed)
Recreation Therapy Notes ? ?Date: 11/06/2021 ? ?Time: 1:25pm   ? ?Location: Day room    ? ?Behavioral response: Appropriate ? ?Intervention Topic: Social-Skills   ? ?Discussion/Intervention:  ?Group content on today was focused on social skills. The group defined social skills and identified ways they use social skills. Patients expressed what obstacles they face when trying to be social. Participants described the importance of social skills. The group listed ways to improve social skills and reasons to improve social skills. Individuals had an opportunity to learn new and improve social skills as well as identify their weaknesses. ?Clinical Observations/Feedback: ?Patient came to group and identified many ways to socialize with others. Participant shared experiences they have had in the past when socializing with others. Individual was social with peers and staff while participating in the intervention.    ?Alicia Rogers LRT/CTRS  ? ? ? ? ? ? ? ?Alicia Rogers ?11/06/2021 3:14 PM ?

## 2021-11-06 NOTE — BHH Counselor (Signed)
CSW spoke with Angie at [facility], [phone], fax: [phone], who confirmed that Monday discharge would be viable.  ? ?CSW requested clarification regarding intake forms and she stated that pt's primary care physician can complete forms within the first weeks of admission to home.  ? ?CSW will follow up with pt's primary care regarding forms and to schedule appointment for follow up.  ? ?Lynetta Tomczak [place], MSW, LCSW-A ?4/7/20234:03 PM  ?

## 2021-11-06 NOTE — Plan of Care (Signed)
Patient remains alert and oriented X2 with soft and delayed speech. Continues to require assistance with ADL's. Ambulatory with slow and unsteady gait. Denies pain or discomfort at this time. Denise depression and anxiety. Denies SI, HI, and AVH. Patient encouraged to ask for assistance. Ate breakfast in the day room with good appetite. AM blood sugar covered with 4u as scheduled. Patient is currently in bed and in no apparent distress. Q 15 minute safety checks continue. ? ? ?Problem: Education: ?Goal: Knowledge of [facility] Education information/materials will improve ?Outcome: Progressing ?Goal: Emotional status will improve ?Outcome: Progressing ?Goal: Mental status will improve ?Outcome: Progressing ?Goal: Verbalization of understanding the information provided will improve ?Outcome: Progressing ?  ?Problem: Activity: ?Goal: Interest or engagement in activities will improve ?Outcome: Progressing ?Goal: Sleeping patterns will improve ?Outcome: Progressing ?  ?Problem: Coping: ?Goal: Ability to verbalize frustrations and anger appropriately will improve ?Outcome: Progressing ?Goal: Ability to demonstrate self-control will improve ?Outcome: Progressing ?  ?Problem: Health Behavior/Discharge Planning: ?Goal: Identification of resources available to assist in meeting health care needs will improve ?Outcome: Progressing ?Goal: Compliance with treatment plan for underlying cause of condition will improve ?Outcome: Progressing ?  ?Problem: Physical Regulation: ?Goal: Ability to maintain clinical measurements within normal limits will improve ?Outcome: Progressing ?  ?Problem: Safety: ?Goal: Periods of time without injury will increase ?Outcome: Progressing ?  ?

## 2021-11-07 LAB — GLUCOSE, CAPILLARY
Glucose-Capillary: 165 mg/dL — ABNORMAL HIGH (ref 70–99)
Glucose-Capillary: 107 mg/dL — ABNORMAL HIGH (ref 70–99)
Glucose-Capillary: 137 mg/dL — ABNORMAL HIGH (ref 70–99)

## 2021-11-07 MED ORDER — DILTIAZEM HCL ER COATED BEADS 180 MG PO CP24
360.0000 mg | ORAL_CAPSULE | Freq: Every day | ORAL | Status: DC
  Administered 2021-11-08 – 2021-11-10 (×3): 360 mg via ORAL
  Filled 2021-11-07 (×5): qty 2

## 2021-11-07 NOTE — Progress Notes (Signed)
Patient is seen in her bedroom sleeping at the beginning of the shift. She is medication compliant, calm, and cooperative. She denies SI, HI, AVH, anxiety, and depression. She voiced no complaints. She continues to require assistance with ADLs and frequently has incidents of urgency causing her to urinate on herself. Pt was cleaned and changed as well as her linens. She is safe on the unit at this time. Q 15 minute safety checks in place.  ?

## 2021-11-07 NOTE — Progress Notes (Signed)
[facility] MD Progress Note ? ?11/07/2021 10:45 AM ?[place]  ?MRN:  [id number] ?Subjective: Patient is seen on rounds.  She continues to complain about her hand but it is less swollen.  Other than that, her mood is pretty good.  Her blood pressure continues to be a problem. ? ?Principal Problem: Schizoaffective disorder, bipolar type ([place]) ?Diagnosis: Principal Problem: ?  Schizoaffective disorder, bipolar type ([place]) ? ?Total Time spent with patient: 15 minutes ? ?Past Psychiatric History: [facility] ? ?Past Medical History:  ?Past Medical History:  ?Diagnosis Date  ? Depression   ? Diabetes mellitus without complication ([place])   ? Diabetes mellitus, type II ([place])   ? Hypertension   ? Schizophrenia ([place])   ? History reviewed. No pertinent surgical history. ?Family History:  ?Family History  ?Problem Relation Age of Onset  ? Diabetes Mother   ? Glaucoma Mother   ? Heart disease Mother   ? Heart attack Mother   ? Depression Mother   ? Hypertension Mother   ? Diabetes Father   ? Glaucoma Father   ? Hypertension Father   ? Diabetes Sister   ? Depression Sister   ? Post-traumatic stress disorder Sister   ? ? ?Social History:  ?Social History  ? ?Substance and Sexual Activity  ?Alcohol Use No  ? Alcohol/week: 0.0 standard drinks  ?   ?Social History  ? ?Substance and Sexual Activity  ?Drug Use No  ?  ?Social History  ? ?Socioeconomic History  ? Marital status: Single  ?  Spouse name: Not on file  ? Number of children: Not on file  ? Years of education: Not on file  ? Highest education level: Not on file  ?Occupational History  ? Not on file  ?Tobacco Use  ? Smoking status: Every Day  ?  Packs/day: 1.00  ?  Types: Cigarettes  ?  Start date: 04/21/1978  ? Smokeless tobacco: Never  ?Vaping Use  ? Vaping Use: Never used  ?Substance and Sexual Activity  ? Alcohol use: No  ?  Alcohol/week: 0.0 standard drinks  ? Drug use: No  ? Sexual activity: Not Currently  ?Other Topics Concern  ? Not on file  ?Social History  Narrative  ? Not on file  ? ?Social Determinants of Health  ? ?Financial Resource Strain: Not on file  ?Food Insecurity: Not on file  ?Transportation Needs: Not on file  ?Physical Activity: Not on file  ?Stress: Not on file  ?Social Connections: Not on file  ? ?Additional Social History:  ?  ?  ?  ?  ?  ?  ?  ?  ?  ?  ?  ? ?Sleep: Good ? ?Appetite:  Good ? ?Current Medications: ?Current Facility-Administered Medications  ?Medication Dose Route Frequency Provider Last Rate Last Admin  ? acetaminophen (TYLENOL) tablet 650 mg  650 mg Oral Q6H PRN Caroline Sauger, NP   650 mg at 11/07/21 1191  ? alum & mag hydroxide-simeth (MAALOX/MYLANTA) 200-200-20 MG/5ML suspension 30 mL  30 mL Oral Q4H PRN Parks Ranger, DO   30 mL at 10/01/21 2215  ? cyclobenzaprine (FLEXERIL) tablet 10 mg  10 mg Oral QHS Parks Ranger, DO   10 mg at 11/06/21 2110  ? [START ON 11/08/2021] diltiazem (CARDIZEM CD) 24 hr capsule 360 mg  360 mg Oral Daily Parks Ranger, DO      ? escitalopram (LEXAPRO) tablet 10 mg  10 mg Oral Daily Parks Ranger, DO  10 mg at 11/07/21 0845  ? feeding supplement (GLUCERNA SHAKE) (GLUCERNA SHAKE) liquid 237 mL  237 mL Oral TID BM Clapacs, Madie Reno, MD   237 mL at 11/07/21 0906  ? Glycerin (Adult) 2 g suppository 1 suppository  1 suppository Rectal Daily PRN Parks Ranger, DO   1 suppository at 10/19/21 1538  ? hydrALAZINE (APRESOLINE) tablet 50 mg  50 mg Oral TID Parks Ranger, DO   50 mg at 11/07/21 0845  ? insulin aspart (novoLOG) injection 4 Units  4 Units Subcutaneous TID WC Parks Ranger, DO   4 Units at 11/07/21 0900  ? linagliptin (TRADJENTA) tablet 5 mg  5 mg Oral Daily Parks Ranger, DO   5 mg at 11/07/21 0848  ? multivitamin (RENA-VIT) tablet 1 tablet  1 tablet Oral QHS Parks Ranger, DO   1 tablet at 11/06/21 2110  ? OLANZapine (ZYPREXA) tablet 10 mg  10 mg Oral QHS Parks Ranger, DO   10 mg at 11/06/21 2110   ? ondansetron (ZOFRAN) tablet 4 mg  4 mg Oral Q6H PRN Parks Ranger, DO      ? Or  ? ondansetron (ZOFRAN) injection 4 mg  4 mg Intravenous Q6H PRN Parks Ranger, DO      ? polyethylene glycol (MIRALAX / GLYCOLAX) packet 17 g  17 g Oral QPC breakfast Parks Ranger, DO   17 g at 11/07/21 0845  ? senna-docusate (Senokot-S) tablet 1 tablet  1 tablet Oral QHS Parks Ranger, DO   1 tablet at 11/06/21 2110  ? sorbitol, milk of mag, mineral oil, glycerin (SMOG) enema  960 mL Rectal Daily PRN Parks Ranger, DO   960 mL at 10/19/21 2202  ? traZODone (DESYREL) tablet 100 mg  100 mg Oral QHS Parks Ranger, DO   100 mg at 11/06/21 2110  ? tuberculin injection 5 Units  5 Units Intradermal Once Parks Ranger, DO   5 Units at 11/06/21 1219  ? ? ?Lab Results:  ?Results for orders placed or performed during the hospital encounter of 09/29/21 (from the past 48 hour(s))  ?Glucose, capillary     Status: Abnormal  ? Collection Time: 11/05/21 11:35 AM  ?Result Value Ref Range  ? Glucose-Capillary 185 (H) 70 - 99 mg/dL  ?  Comment: Glucose reference range applies only to samples taken after fasting for at least 8 hours.  ?Glucose, capillary     Status: Abnormal  ? Collection Time: 11/05/21  4:30 PM  ?Result Value Ref Range  ? Glucose-Capillary 123 (H) 70 - 99 mg/dL  ?  Comment: Glucose reference range applies only to samples taken after fasting for at least 8 hours.  ?Glucose, capillary     Status: Abnormal  ? Collection Time: 11/06/21  7:38 AM  ?Result Value Ref Range  ? Glucose-Capillary 141 (H) 70 - 99 mg/dL  ?  Comment: Glucose reference range applies only to samples taken after fasting for at least 8 hours.  ?Glucose, capillary     Status: Abnormal  ? Collection Time: 11/06/21 11:53 AM  ?Result Value Ref Range  ? Glucose-Capillary 112 (H) 70 - 99 mg/dL  ?  Comment: Glucose reference range applies only to samples taken after fasting for at least 8 hours.  ?Glucose,  capillary     Status: Abnormal  ? Collection Time: 11/06/21  4:31 PM  ?Result Value Ref Range  ? Glucose-Capillary 128 (H) 70 - 99 mg/dL  ?  Comment: Glucose reference range applies only to samples taken after fasting for at least 8 hours.  ?Glucose, capillary     Status: Abnormal  ? Collection Time: 11/07/21  8:04 AM  ?Result Value Ref Range  ? Glucose-Capillary 165 (H) 70 - 99 mg/dL  ?  Comment: Glucose reference range applies only to samples taken after fasting for at least 8 hours.  ? ? ?Blood Alcohol level:  ?Lab Results  ?Component Value Date  ? ETH <10 09/20/2021  ? ETH <10 10/13/2020  ? ? ?Metabolic Disorder Labs: ?Lab Results  ?Component Value Date  ? HGBA1C 8.0 (H) 09/27/2021  ? MPG 182.9 09/27/2021  ? MPG 180.03 10/13/2020  ? ?No results found for: PROLACTIN ?Lab Results  ?Component Value Date  ? CHOL 267 (H) 10/13/2020  ? TRIG 180 (H) 10/13/2020  ? HDL 86 10/13/2020  ? CHOLHDL 3.1 10/13/2020  ? VLDL 36 10/13/2020  ? LDLCALC 145 (H) 10/13/2020  ? LDLCALC 158 (H) 02/29/2020  ? ? ?Physical Findings: ?AIMS:  , ,  ,  ,    ?CIWA:    ?COWS:    ? ?Musculoskeletal: ?Strength & Muscle Tone: within normal limits ?Gait & Station: normal ?Patient leans: N/A ? ?Psychiatric Specialty Exam: ? ?Presentation  ?General Appearance: Bizarre; Disheveled ? ?Eye Contact:Fair ? ?Speech:Garbled ? ?Speech Volume:Decreased ? ?Handedness:Right ? ? ?Mood and Affect  ?Mood:No data recorded ?Affect:Flat ? ? ?Thought Process  ?Thought Processes:Disorganized ? ?Descriptions of Associations:Loose ? ?Orientation:Partial ? ?Thought Content:Illogical ? ?History of Schizophrenia/Schizoaffective disorder:Yes ? ?Duration of Psychotic Symptoms:Greater than six months ? ?Hallucinations:No data recorded ?Ideas of Reference:No data recorded ?Suicidal Thoughts:No data recorded ?Homicidal Thoughts:No data recorded ? ?Sensorium  ?Memory:Immediate Poor ? ?Judgment:Impaired ? ?Insight:Lacking ? ? ?Executive Functions  ?Concentration:Poor ? ?Attention  Span:Poor ? ?Recall:Poor ? ?Fund of Knowledge:Poor ? ?Language:Poor ? ? ?Psychomotor Activity  ?Psychomotor Activity:No data recorded ? ?Assets  ?Assets:Housing; Social Support ? ? ?Sleep  ?Sleep:No dat

## 2021-11-07 NOTE — Progress Notes (Signed)
Patient is seen in the dayroom at the beginning of the shift interacting appropriately with others. Pt was assisted to the bathroom for toileting. Pt c/o pain rated 10/10 in her left hand. PRN tylenol administered as per MAR orders. Pt denies depression, anxiety, SI, HI, and AVH. She is safe on the unit at this time. Q 15 minute safety checks in place.  ?

## 2021-11-07 NOTE — Group Note (Signed)
LCSW Group Therapy Note ? ?Group Date: 11/07/2021 ?Start Time: 1300 ?End Time: 1400 ? ? ?Type of Therapy and Topic:  Group Therapy - Healthy vs Unhealthy Coping Skills ? ?Participation Level:  Did Not Attend  ? ?Description of Group ?The focus of this group was to determine what unhealthy coping techniques typically are used by group members and what healthy coping techniques would be helpful in coping with various problems. Patients were guided in becoming aware of the differences between healthy and unhealthy coping techniques. Patients were asked to identify 2-3 healthy coping skills they would like to learn to use more effectively. ? ?Therapeutic Goals ?Patients learned that coping is what human beings do all day long to deal with various situations in their lives ?Patients defined and discussed healthy vs unhealthy coping techniques ?Patients identified their preferred coping techniques and identified whether these were healthy or unhealthy ?Patients determined 2-3 healthy coping skills they would like to become more familiar with and use more often. ?Patients provided support and ideas to each other ? ? ?Summary of Patient Progress: Due to limited staffing, group was not held on the unit.  ? ? ?Therapeutic Modalities ?Cognitive Behavioral Therapy ?Motivational Interviewing ? ?Berniece Salines, LCSWA ?11/07/2021  4:00 PM   ?

## 2021-11-07 NOTE — BHH Group Notes (Signed)
Group: chair yoga  ?Pt was present, not participating,calm, composed but refusing to participate  ?

## 2021-11-08 LAB — GLUCOSE, CAPILLARY
Glucose-Capillary: 117 mg/dL — ABNORMAL HIGH (ref 70–99)
Glucose-Capillary: 92 mg/dL (ref 70–99)
Glucose-Capillary: 183 mg/dL — ABNORMAL HIGH (ref 70–99)

## 2021-11-08 LAB — CREATININE, SERUM
Creatinine, Ser: 2.14 mg/dL — ABNORMAL HIGH (ref 0.44–1.00)
GFR, Estimated: 26 mL/min — ABNORMAL LOW (ref >60)

## 2021-11-08 NOTE — Group Note (Addendum)
[place] LCSW Group Therapy Note ? ? ?Group Date: 11/08/2021 ?Start Time: 1310 ?End Time: 1410 ? ? ?Type of Therapy/Topic:  Group Therapy:  Emotion Regulation ? ?Participation Level:  Active  ? ?Description of Group:   ? The purpose of this group is to assist patients in learning to regulate negative emotions and experience positive emotions. Patients will be guided to discuss ways in which they have been vulnerable to their negative emotions. These vulnerabilities will be juxtaposed with experiences of positive emotions or situations, and patients challenged to use positive emotions to combat negative ones. Special emphasis will be placed on coping with negative emotions in conflict situations, and patients will process healthy conflict resolution skills. ? ?Therapeutic Goals: ?Patient will identify two positive emotions or experiences to reflect on in order to balance out negative emotions:  ?Patient will label two or more emotions that they find the most difficult to experience:  ?Patient will be able to demonstrate positive conflict resolution skills through discussion or role plays:  ? ?Summary of Patient Progress: Patient was present for the entirety of the group session. Patient shared in the icebreaker activity, identifying positive emotions elicited from a memory. Patient shared how grief is a difficult emotion to experience, expressing that she continues to grieve the loss of her parents. Patient was observed reading the supplemental group handouts. ? ? ?Therapeutic Modalities:   ?Cognitive Behavioral Therapy ?Feelings Identification ?Dialectical Behavioral Therapy ? ? ?Kenna Gilbert Costella Schwarz, LCSWA ?

## 2021-11-08 NOTE — Progress Notes (Signed)
Patient presents A&O x 4 with flat and blunted affect.  Speech soft with delayed response.  Denies AVH, SI or HI.  Pt c/o left hand discomfort "hurts a little".  Noted  2+ non pitting edema to left hand and feet.  Tylenol given, elevated hand on pillow.  Compliant with all evening meds.  Patient spent most of the evening in her bed sleeping except during snack time.  Ambulating independent with steady gait.  Denies needs or complaints.  Will continue to monitor with Q 15 min safey checks ?

## 2021-11-08 NOTE — Progress Notes (Signed)
[facility] MD Progress Note ? ?11/08/2021 10:44 AM ?[place]  ?MRN:  [id number] ?Subjective: Patient is seen today.  No issues, no problems, no complaints.  No side effects from her medications and she is in a good mood. ? ?Principal Problem: Schizoaffective disorder, bipolar type ([place]) ?Diagnosis: Principal Problem: ?  Schizoaffective disorder, bipolar type ([place]) ? ?Total Time spent with patient: 15 minutes ? ?Past Psychiatric History: [facility] ? ?Past Medical History:  ?Past Medical History:  ?Diagnosis Date  ? Depression   ? Diabetes mellitus without complication ([place])   ? Diabetes mellitus, type II ([place])   ? Hypertension   ? Schizophrenia ([place])   ? History reviewed. No pertinent surgical history. ?Family History:  ?Family History  ?Problem Relation Age of Onset  ? Diabetes Mother   ? Glaucoma Mother   ? Heart disease Mother   ? Heart attack Mother   ? Depression Mother   ? Hypertension Mother   ? Diabetes Father   ? Glaucoma Father   ? Hypertension Father   ? Diabetes Sister   ? Depression Sister   ? Post-traumatic stress disorder Sister   ? ? ?Social History:  ?Social History  ? ?Substance and Sexual Activity  ?Alcohol Use No  ? Alcohol/week: 0.0 standard drinks  ?   ?Social History  ? ?Substance and Sexual Activity  ?Drug Use No  ?  ?Social History  ? ?Socioeconomic History  ? Marital status: Single  ?  Spouse name: Not on file  ? Number of children: Not on file  ? Years of education: Not on file  ? Highest education level: Not on file  ?Occupational History  ? Not on file  ?Tobacco Use  ? Smoking status: Every Day  ?  Packs/day: 1.00  ?  Types: Cigarettes  ?  Start date: 04/21/1978  ? Smokeless tobacco: Never  ?Vaping Use  ? Vaping Use: Never used  ?Substance and Sexual Activity  ? Alcohol use: No  ?  Alcohol/week: 0.0 standard drinks  ? Drug use: No  ? Sexual activity: Not Currently  ?Other Topics Concern  ? Not on file  ?Social History Narrative  ? Not on file  ? ?Social Determinants of  Health  ? ?Financial Resource Strain: Not on file  ?Food Insecurity: Not on file  ?Transportation Needs: Not on file  ?Physical Activity: Not on file  ?Stress: Not on file  ?Social Connections: Not on file  ? ?Additional Social History:  ?  ?  ?  ?  ?  ?  ?  ?  ?  ?  ?  ? ?Sleep: Good ? ?Appetite:  Good ? ?Current Medications: ?Current Facility-Administered Medications  ?Medication Dose Route Frequency Provider Last Rate Last Admin  ? acetaminophen (TYLENOL) tablet 650 mg  650 mg Oral Q6H PRN Caroline Sauger, NP   650 mg at 11/07/21 2039  ? alum & mag hydroxide-simeth (MAALOX/MYLANTA) 200-200-20 MG/5ML suspension 30 mL  30 mL Oral Q4H PRN Parks Ranger, DO   30 mL at 10/01/21 2215  ? cyclobenzaprine (FLEXERIL) tablet 10 mg  10 mg Oral QHS Parks Ranger, DO   10 mg at 11/07/21 2103  ? diltiazem (CARDIZEM CD) 24 hr capsule 360 mg  360 mg Oral Daily Parks Ranger, DO   360 mg at 11/08/21 6948  ? escitalopram (LEXAPRO) tablet 10 mg  10 mg Oral Daily Parks Ranger, DO   10 mg at 11/08/21 0901  ? feeding supplement (GLUCERNA  SHAKE) (GLUCERNA SHAKE) liquid 237 mL  237 mL Oral TID BM Clapacs, Madie Reno, MD   237 mL at 11/08/21 0934  ? Glycerin (Adult) 2 g suppository 1 suppository  1 suppository Rectal Daily PRN Parks Ranger, DO   1 suppository at 10/19/21 1538  ? hydrALAZINE (APRESOLINE) tablet 50 mg  50 mg Oral TID Parks Ranger, DO   50 mg at 11/08/21 0858  ? insulin aspart (novoLOG) injection 4 Units  4 Units Subcutaneous TID WC Parks Ranger, DO   4 Units at 11/08/21 8756  ? linagliptin (TRADJENTA) tablet 5 mg  5 mg Oral Daily Parks Ranger, DO   5 mg at 11/08/21 0900  ? multivitamin (RENA-VIT) tablet 1 tablet  1 tablet Oral QHS Parks Ranger, DO   1 tablet at 11/07/21 2103  ? OLANZapine (ZYPREXA) tablet 10 mg  10 mg Oral QHS Parks Ranger, DO   10 mg at 11/07/21 2103  ? ondansetron (ZOFRAN) tablet 4 mg  4 mg Oral Q6H  PRN Parks Ranger, DO      ? Or  ? ondansetron (ZOFRAN) injection 4 mg  4 mg Intravenous Q6H PRN Parks Ranger, DO      ? polyethylene glycol (MIRALAX / GLYCOLAX) packet 17 g  17 g Oral QPC breakfast Parks Ranger, DO   17 g at 11/07/21 0845  ? senna-docusate (Senokot-S) tablet 1 tablet  1 tablet Oral QHS Parks Ranger, DO   1 tablet at 11/07/21 2104  ? sorbitol, milk of mag, mineral oil, glycerin (SMOG) enema  960 mL Rectal Daily PRN Parks Ranger, DO   960 mL at 10/19/21 2202  ? traZODone (DESYREL) tablet 100 mg  100 mg Oral QHS Parks Ranger, DO   100 mg at 11/07/21 2103  ? tuberculin injection 5 Units  5 Units Intradermal Once Parks Ranger, DO   5 Units at 11/06/21 1219  ? ? ?Lab Results:  ?Results for orders placed or performed during the hospital encounter of 09/29/21 (from the past 48 hour(s))  ?Glucose, capillary     Status: Abnormal  ? Collection Time: 11/06/21 11:53 AM  ?Result Value Ref Range  ? Glucose-Capillary 112 (H) 70 - 99 mg/dL  ?  Comment: Glucose reference range applies only to samples taken after fasting for at least 8 hours.  ?Glucose, capillary     Status: Abnormal  ? Collection Time: 11/06/21  4:31 PM  ?Result Value Ref Range  ? Glucose-Capillary 128 (H) 70 - 99 mg/dL  ?  Comment: Glucose reference range applies only to samples taken after fasting for at least 8 hours.  ?Glucose, capillary     Status: Abnormal  ? Collection Time: 11/07/21  8:04 AM  ?Result Value Ref Range  ? Glucose-Capillary 165 (H) 70 - 99 mg/dL  ?  Comment: Glucose reference range applies only to samples taken after fasting for at least 8 hours.  ?Glucose, capillary     Status: Abnormal  ? Collection Time: 11/07/21 11:50 AM  ?Result Value Ref Range  ? Glucose-Capillary 107 (H) 70 - 99 mg/dL  ?  Comment: Glucose reference range applies only to samples taken after fasting for at least 8 hours.  ?Glucose, capillary     Status: Abnormal  ? Collection Time:  11/07/21  3:53 PM  ?Result Value Ref Range  ? Glucose-Capillary 137 (H) 70 - 99 mg/dL  ?  Comment: Glucose reference range applies only to samples taken  after fasting for at least 8 hours.  ?Creatinine, serum     Status: Abnormal  ? Collection Time: 11/08/21  6:50 AM  ?Result Value Ref Range  ? Creatinine, Ser 2.14 (H) 0.44 - 1.00 mg/dL  ? GFR, Estimated 26 (L) >60 mL/min  ?  Comment: (NOTE) ?Calculated using the CKD-EPI Creatinine Equation (2021) ?Performed at [facility], [place], ?[place] [address] ?  ?Glucose, capillary     Status: Abnormal  ? Collection Time: 11/08/21  7:45 AM  ?Result Value Ref Range  ? Glucose-Capillary 117 (H) 70 - 99 mg/dL  ?  Comment: Glucose reference range applies only to samples taken after fasting for at least 8 hours.  ? ? ?Blood Alcohol level:  ?Lab Results  ?Component Value Date  ? ETH <10 09/20/2021  ? ETH <10 10/13/2020  ? ? ?Metabolic Disorder Labs: ?Lab Results  ?Component Value Date  ? HGBA1C 8.0 (H) 09/27/2021  ? MPG 182.9 09/27/2021  ? MPG 180.03 10/13/2020  ? ?No results found for: PROLACTIN ?Lab Results  ?Component Value Date  ? CHOL 267 (H) 10/13/2020  ? TRIG 180 (H) 10/13/2020  ? HDL 86 10/13/2020  ? CHOLHDL 3.1 10/13/2020  ? VLDL 36 10/13/2020  ? LDLCALC 145 (H) 10/13/2020  ? LDLCALC 158 (H) 02/29/2020  ? ? ?Physical Findings: ?AIMS:  , ,  ,  ,    ?CIWA:    ?COWS:    ? ?Musculoskeletal: ?Strength & Muscle Tone: within normal limits ?Gait & Station: normal ?Patient leans: N/A ? ?Psychiatric Specialty Exam: ? ?Presentation  ?General Appearance: Bizarre; Disheveled ? ?Eye Contact:Fair ? ?Speech:Garbled ? ?Speech Volume:Decreased ? ?Handedness:Right ? ? ?Mood and Affect  ?Mood:No data recorded ?Affect:Flat ? ? ?Thought Process  ?Thought Processes:Disorganized ? ?Descriptions of Associations:Loose ? ?Orientation:Partial ? ?Thought Content:Illogical ? ?History of Schizophrenia/Schizoaffective disorder:Yes ? ?Duration of Psychotic Symptoms:Greater  than six months ? ?Hallucinations:No data recorded ?Ideas of Reference:No data recorded ?Suicidal Thoughts:No data recorded ?Homicidal Thoughts:No data recorded ? ?Sensorium  ?Memory:Immediate Poor ? ?[place]

## 2021-11-08 NOTE — Progress Notes (Signed)
Patient calm and cooperative with assessment. She denies SI, HI, and AVH. She endorses pain in left hand. Hand was elevated and massaged, with some relief. Patient has been active in the milieu this morning. Patient ate breakfast and participated in group yoga. Patient is compliant with scheduled medications. Patient remains safe on the unit at this time.  ?

## 2021-11-08 NOTE — BHH Group Notes (Signed)
Midday outdoor  Group: outdoor patio , corn hole, music, socializing. Pt  participated ?

## 2021-11-08 NOTE — [facility] Group Notes (Signed)
Morning group - chair yoga pt present but ddi not participate  ? ?

## 2021-11-09 LAB — GLUCOSE, CAPILLARY
Glucose-Capillary: 119 mg/dL — ABNORMAL HIGH (ref 70–99)
Glucose-Capillary: 123 mg/dL — ABNORMAL HIGH (ref 70–99)
Glucose-Capillary: 121 mg/dL — ABNORMAL HIGH (ref 70–99)

## 2021-11-09 LAB — SARS CORONAVIRUS 2 (TAT 6-24 HRS): SARS Coronavirus 2: NEGATIVE

## 2021-11-09 NOTE — Progress Notes (Signed)
Patient presents A&O x 4 with flat and blunted affect.  Speech soft with delayed response.  States she is looking forward to discharge tomorrow.  Denies AVH, SI or HI.  Pt c/o left hand discomfort "hurts a little".  Noted  2+ non pitting edema to left hand and feet.  Tylenol given, elevated hand on pillow.  Compliant with all evening meds.  Patient spent most of the evening in her bed sleeping except during snack time.  Ambulating independent with steady gait.  Denies needs or complaints.  Will continue to monitor with Q 15 min safey checks ?

## 2021-11-09 NOTE — Group Note (Signed)
[place] LCSW Group Therapy Note ? ? ? ?Group Date: 11/09/2021 ?Start Time: 1400 ?End Time: 4818 ? ?Type of Therapy and Topic:  Group Therapy:  Overcoming Obstacles ? ?Participation Level:  [facility] PARTICIPATION LEVEL: Did Not Attend ? ?Mood: ? ?Description of Group:   ?In this group patients will be encouraged to explore what they see as obstacles to their own wellness and recovery. They will be guided to discuss their thoughts, feelings, and behaviors related to these obstacles. The group will process together ways to cope with barriers, with attention given to specific choices patients can make. Each patient will be challenged to identify changes they are motivated to make in order to overcome their obstacles. This group will be process-oriented, with patients participating in exploration of their own experiences as well as giving and receiving support and challenge from other group members. ? ?Therapeutic Goals: ?1. Patient will identify personal and current obstacles as they relate to admission. ?2. Patient will identify barriers that currently interfere with their wellness or overcoming obstacles.  ?3. Patient will identify feelings, thought process and behaviors related to these barriers. ?4. Patient will identify two changes they are willing to make to overcome these obstacles:  ? ? ?Summary of Patient Progress ? ? ?X ? ? ?Therapeutic Modalities:   ?Cognitive Behavioral Therapy ?Solution Focused Therapy ?Motivational Interviewing ?Relapse Prevention Therapy ? ? ?Alondra Vandeven A [place], LCSWA ?

## 2021-11-09 NOTE — [place] FL2 (Signed)
?[place] MEDICAID FL2 LEVEL OF CARE SCREENING TOOL  ?  ? ?IDENTIFICATION  ?Patient Name: ?Alicia Rogers Birthdate: [birth year] Sex: female Admission Date (Current Location): ?09/29/2021  ?[place] and [place] Number: ? [place] ?  Facility and Address:  ?[facility], [address] [place], [place] [address] ?     Provider Number: ?[id number]  ?Attending Physician Name and Address:  ?Parks Ranger,* ? Relative Name and Phone Number:  ?Yesly Gerety (Sister)   [phone] ?   ?Current Level of Care: ?Hospital Recommended Level of Care: ?[place] Prior Approval Number: ?  ? ?Date Approved/Denied: ?  PASRR Number: ?screen still running, MUST ID: [id number] ? ?Discharge Plan: ?Other (Comment) [facility] [place]) ?  ? ?Current Diagnoses: ?Patient Active Problem List  ? Diagnosis Date Noted  ? Schizoaffective disorder, bipolar type ([place]) 09/29/2021  ? Hypertensive urgency 09/28/2021  ? Acute metabolic encephalopathy 67/61/9509  ? Left lower lobe pneumonia 09/27/2021  ? Sinus bradycardia 09/27/2021  ? Anemia in chronic kidney disease (CKD) 09/27/2021  ? Left arm swelling 09/27/2021  ? 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  ? Essential hypertension 02/28/2020  ? Back pain 02/28/2020  ? CKD (chronic kidney disease), stage IV ([place]) 02/28/2020  ? Hematuria 02/28/2020  ? HLD (hyperlipidemia) 02/28/2020  ? Compulsive tobacco user syndrome 10/11/2014  ? 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  ? Claudication ([place]) 06/09/2014  ? Schizoaffective disorder, depressive type ([place]) 06/09/2014  ? Controlled type 2 diabetes mellitus without complication ([place]) 32/67/1245  ? ? ?Orientation RESPIRATION BLADDER Height & Weight   ?  ?Self ? Normal Incontinent Weight: 103 lb 8 oz (46.9 kg) ?Height:  5' 2.99" (160 cm)  ?BEHAVIORAL SYMPTOMS/MOOD NEUROLOGICAL BOWEL NUTRITION STATUS  ?   (N/A) Incontinent Diet (diabetic diet)  ?AMBULATORY STATUS COMMUNICATION OF NEEDS Skin    ?Independent Verbally   ?  ?  ?  ?    ?     ?     ? ? ?Personal Care Assistance Level of Assistance  ?Bathing Bathing Assistance: Limited assistance ?  ?  ?   ? ?Functional Limitations Info  ?Speech   ?  ?Speech Info: Impaired (delayed responses)  ? ? ?SPECIAL CARE FACTORS FREQUENCY  ?Blood pressure Blood Pressure Frequency: x2 ?  ?  ?  ?  ?  ?  ?   ? ? ?Contractures Contractures Info: Not present  ? ? ?Additional Factors Info  ?Code Status Code Status Info: FULL ?  ?  ?  ?  ?   ? ?Current Medications (11/09/2021):  This is the current hospital active medication list ?Current Facility-Administered Medications  ?Medication Dose Route Frequency Provider Last Rate Last Admin  ? acetaminophen (TYLENOL) tablet 650 mg  650 mg Oral Q6H PRN Caroline Sauger, NP   650 mg at 11/07/21 2039  ? alum & mag hydroxide-simeth (MAALOX/MYLANTA) 200-200-20 MG/5ML suspension 30 mL  30 mL Oral Q4H PRN Parks Ranger, DO   30 mL at 10/01/21 2215  ? cyclobenzaprine (FLEXERIL) tablet 10 mg  10 mg Oral QHS Parks Ranger, DO   10 mg at 11/08/21 2125  ? diltiazem (CARDIZEM CD) 24 hr capsule 360 mg  360 mg Oral Daily Parks Ranger, DO   360 mg at 11/09/21 0900  ? escitalopram (LEXAPRO) tablet 10 mg  10 mg Oral Daily Parks Ranger, DO   10 mg at 11/09/21 0900  ? feeding supplement (GLUCERNA SHAKE) (GLUCERNA SHAKE) liquid 237 mL  237  mL Oral TID BM Clapacs, John T, MD   237 mL at 11/09/21 0900  ? Glycerin (Adult) 2 g suppository 1 suppository  1 suppository Rectal Daily PRN Parks Ranger, DO   1 suppository at 10/19/21 1538  ? hydrALAZINE (APRESOLINE) tablet 50 mg  50 mg Oral TID Parks Ranger, DO   50 mg at 11/09/21 2585  ? insulin aspart (novoLOG) injection 4 Units  4 Units Subcutaneous TID WC Parks Ranger, DO   4 Units at 11/09/21 2778  ? linagliptin (TRADJENTA) tablet 5 mg  5 mg Oral Daily Parks Ranger, DO   5 mg at 11/09/21 0900  ? multivitamin (RENA-VIT)  tablet 1 tablet  1 tablet Oral QHS Parks Ranger, DO   1 tablet at 11/08/21 2125  ? OLANZapine (ZYPREXA) tablet 10 mg  10 mg Oral QHS Parks Ranger, DO   10 mg at 11/08/21 2125  ? ondansetron (ZOFRAN) tablet 4 mg  4 mg Oral Q6H PRN Parks Ranger, DO      ? Or  ? ondansetron (ZOFRAN) injection 4 mg  4 mg Intravenous Q6H PRN Parks Ranger, DO      ? polyethylene glycol (MIRALAX / GLYCOLAX) packet 17 g  17 g Oral QPC breakfast Parks Ranger, DO   17 g at 11/09/21 2423  ? senna-docusate (Senokot-S) tablet 1 tablet  1 tablet Oral QHS Parks Ranger, DO   1 tablet at 11/08/21 2125  ? sorbitol, milk of mag, mineral oil, glycerin (SMOG) enema  960 mL Rectal Daily PRN Parks Ranger, DO   960 mL at 10/19/21 2202  ? traZODone (DESYREL) tablet 100 mg  100 mg Oral QHS Parks Ranger, DO   100 mg at 11/08/21 2125  ? ? ? ?Discharge Medications: ?Please see discharge summary for a list of discharge medications. ? ?Relevant Imaging Results: ? ?Relevant Lab Results: ? ? 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 ?Curran Lenderman A [place], LCSWA ? ? ? ? ?

## 2021-11-09 NOTE — Plan of Care (Signed)
°  Problem: Group Participation °Goal: STG - Patient will engage in groups without prompting or encouragement from LRT x3 group sessions within 5 recreation therapy group sessions °Description: STG - Patient will engage in groups without prompting or encouragement from LRT x3 group sessions within 5 recreation therapy group sessions °Outcome: Progressing °  °

## 2021-11-09 NOTE — Progress Notes (Signed)
?   11/08/21 1717  ?PPD Results  ?Does patient have an induration at the injection site? No  ?Induration(mm) 0 mm  ?Name of Physician Notified Dr. Louis Meckel  ? ? ?

## 2021-11-09 NOTE — Progress Notes (Signed)
Patient is calm and cooperative with assessment. She denies SI, HI, and AVH. She endorses pain in both feet. On assessment, patient's feet are swollen, +2 edema. Patient's lower extremities were elevated. Patient had an episode of incontinence in the morning. Patient was showered and changed. Patient has been in the dayroom since breakfast and participated in group yoga. Patient is compliant with scheduled medications. She remains safe on the unit at this time. ?

## 2021-11-09 NOTE — [facility] Group Notes (Signed)
Group: chair yoga, Pt present, Pt continues to refuse to participate unless encouraged repeatedly by techs  ? ?

## 2021-11-09 NOTE — BHH Counselor (Signed)
CSW contacted pt's sister, Breyanna Valera, [phone], who stated that she could provide transportation to [facility] as well and assist pt in obtaining her funds.  ? ?She stated she would need to talk with her family about providing additional funding for pt's needs that arise. CSW informed [name] to contact Joy at [facility] to discuss details regarding pt admission. CSW will follow up to assist with coordinating transportation and discharge.  ? ?Benaiah Behan [place], MSW, LCSW-A ?4/10/20239:24 AM  ?

## 2021-11-09 NOTE — BH IP Treatment Plan (Signed)
Interdisciplinary Treatment and Diagnostic Plan Update ? ?11/09/2021 ?Time of Session: 9:30AM ?[place] ?MRN: [id number] ? ?Principal Diagnosis: Schizoaffective disorder, bipolar type ([place]) ? ?Secondary Diagnoses: Principal Problem: ?  Schizoaffective disorder, bipolar type ([place]) ? ? ?Current Medications:  ?Current Facility-Administered Medications  ?Medication Dose Route Frequency Provider Last Rate Last Admin  ? acetaminophen (TYLENOL) tablet 650 mg  650 mg Oral Q6H PRN Caroline Sauger, NP   650 mg at 11/07/21 2039  ? alum & mag hydroxide-simeth (MAALOX/MYLANTA) 200-200-20 MG/5ML suspension 30 mL  30 mL Oral Q4H PRN Parks Ranger, DO   30 mL at 10/01/21 2215  ? cyclobenzaprine (FLEXERIL) tablet 10 mg  10 mg Oral QHS Parks Ranger, DO   10 mg at 11/08/21 2125  ? diltiazem (CARDIZEM CD) 24 hr capsule 360 mg  360 mg Oral Daily Parks Ranger, DO   360 mg at 11/09/21 0900  ? escitalopram (LEXAPRO) tablet 10 mg  10 mg Oral Daily Parks Ranger, DO   10 mg at 11/09/21 0900  ? feeding supplement (GLUCERNA SHAKE) (GLUCERNA SHAKE) liquid 237 mL  237 mL Oral TID BM Clapacs, John T, MD   237 mL at 11/09/21 0900  ? Glycerin (Adult) 2 g suppository 1 suppository  1 suppository Rectal Daily PRN Parks Ranger, DO   1 suppository at 10/19/21 1538  ? hydrALAZINE (APRESOLINE) tablet 50 mg  50 mg Oral TID Parks Ranger, DO   50 mg at 11/09/21 7322  ? insulin aspart (novoLOG) injection 4 Units  4 Units Subcutaneous TID WC Parks Ranger, DO   4 Units at 11/09/21 0254  ? linagliptin (TRADJENTA) tablet 5 mg  5 mg Oral Daily Parks Ranger, DO   5 mg at 11/09/21 0900  ? multivitamin (RENA-VIT) tablet 1 tablet  1 tablet Oral QHS Parks Ranger, DO   1 tablet at 11/08/21 2125  ? OLANZapine (ZYPREXA) tablet 10 mg  10 mg Oral QHS Parks Ranger, DO   10 mg at 11/08/21 2125  ? ondansetron (ZOFRAN) tablet 4 mg  4 mg Oral Q6H PRN Parks Ranger, DO      ? Or  ? ondansetron (ZOFRAN) injection 4 mg  4 mg Intravenous Q6H PRN Parks Ranger, DO      ? polyethylene glycol (MIRALAX / GLYCOLAX) packet 17 g  17 g Oral QPC breakfast Parks Ranger, DO   17 g at 11/09/21 2706  ? senna-docusate (Senokot-S) tablet 1 tablet  1 tablet Oral QHS Parks Ranger, DO   1 tablet at 11/08/21 2125  ? sorbitol, milk of mag, mineral oil, glycerin (SMOG) enema  960 mL Rectal Daily PRN Parks Ranger, DO   960 mL at 10/19/21 2202  ? traZODone (DESYREL) tablet 100 mg  100 mg Oral QHS Parks Ranger, DO   100 mg at 11/08/21 2125  ? ?PTA Medications: ?Medications Prior to Admission  ?Medication Sig Dispense Refill Last Dose  ? amLODipine (NORVASC) 10 MG tablet Take 1 tablet (10 mg total) by mouth daily.     ? [EXPIRED] cefdinir (OMNICEF) 300 MG capsule Take 1 capsule (300 mg total) by mouth daily for 3 days. 3 capsule 0   ? hydrALAZINE (APRESOLINE) 50 MG tablet Take 1 tablet (50 mg total) by mouth 3 (three) times daily.     ? hydrochlorothiazide (HYDRODIURIL) 12.5 MG tablet Take 1 tablet (12.5 mg total) by mouth daily.     ? ? ?Patient  Stressors: Health problems   ? ?Patient Strengths: Religious Affiliation  ? ?Treatment Modalities: Medication Management, Group therapy, Case management,  ?1 to 1 session with clinician, Psychoeducation, Recreational therapy. ? ? ?Physician Treatment Plan for Primary Diagnosis: Schizoaffective disorder, bipolar type ([place]) ?Long Term Goal(s): Improvement in symptoms so as ready for discharge  ? ?Short Term Goals: Ability to identify changes in lifestyle to reduce recurrence of condition will improve ?Ability to verbalize feelings will improve ?Ability to disclose and discuss suicidal ideas ?Ability to demonstrate self-control will improve ?Ability to identify and develop effective coping behaviors will improve ?Ability to maintain clinical measurements within normal limits will  improve ?Compliance with prescribed medications will improve ?Ability to identify triggers associated with substance abuse/mental health issues will improve ? ?Medication Management: Evaluate patient's response, side effects, and tolerance of medication regimen. ? ?Therapeutic Interventions: 1 to 1 sessions, Unit Group sessions and Medication administration. ? ?Evaluation of Outcomes: Adequate for Discharge ? ?Physician Treatment Plan for Secondary Diagnosis: Principal Problem: ?  Schizoaffective disorder, bipolar type ([place]) ? ?Long Term Goal(s): Improvement in symptoms so as ready for discharge  ? ?Short Term Goals: Ability to identify changes in lifestyle to reduce recurrence of condition will improve ?Ability to verbalize feelings will improve ?Ability to disclose and discuss suicidal ideas ?Ability to demonstrate self-control will improve ?Ability to identify and develop effective coping behaviors will improve ?Ability to maintain clinical measurements within normal limits will improve ?Compliance with prescribed medications will improve ?Ability to identify triggers associated with substance abuse/mental health issues will improve    ? ?Medication Management: Evaluate patient's response, side effects, and tolerance of medication regimen. ? ?Therapeutic Interventions: 1 to 1 sessions, Unit Group sessions and Medication administration. ? ?Evaluation of Outcomes: Adequate for Discharge ? ? ?RN Treatment Plan for Primary Diagnosis: Schizoaffective disorder, bipolar type ([place]) ?Long Term Goal(s): Knowledge of disease and therapeutic regimen to maintain health will improve ? ?Short Term Goals: Ability to remain free from injury will improve, Ability to verbalize frustration and anger appropriately will improve, Ability to demonstrate self-control, Ability to participate in decision making will improve, Ability to verbalize feelings will improve, Ability to identify and develop effective coping behaviors will improve,  and Compliance with prescribed medications will improve ? ?Medication Management: RN will administer medications as ordered by provider, will assess and evaluate patient's response and provide education to patient for prescribed medication. RN will report any adverse and/or side effects to prescribing provider. ? ?Therapeutic Interventions: 1 on 1 counseling sessions, Psychoeducation, Medication administration, Evaluate responses to treatment, Monitor vital signs and CBGs as ordered, Perform/monitor CIWA, COWS, AIMS and Fall Risk screenings as ordered, Perform wound care treatments as ordered. ? ?Evaluation of Outcomes: Adequate for Discharge ? ? ?LCSW Treatment Plan for Primary Diagnosis: Schizoaffective disorder, bipolar type ([place]) ?Long Term Goal(s): Safe transition to appropriate next level of care at discharge, Engage patient in therapeutic group addressing interpersonal concerns. ? ?Short Term Goals: Engage patient in aftercare planning with referrals and resources, Increase social support, Increase ability to appropriately verbalize feelings, Increase emotional regulation, Facilitate acceptance of mental health diagnosis and concerns, Identify triggers associated with mental health/substance abuse issues, and Increase skills for wellness and recovery ? ?Therapeutic Interventions: Assess for all discharge needs, 1 to 1 time with Education officer, museum, Explore available resources and support systems, Assess for adequacy in community support network, Educate family and significant other(s) on suicide prevention, Complete Psychosocial Assessment, Interpersonal group therapy. ? ?Evaluation of Outcomes: Adequate for Discharge ? ? ?  Progress in Treatment: ?Attending groups: Yes. ?Participating in groups: Yes. ?Taking medication as prescribed: Yes. ?Toleration medication: Yes. ?Family/Significant other contact made: Yes, individual(s) contacted:  SPE completed with [name], pt's sister ?Patient understands diagnosis:  Yes. ?Discussing patient identified problems/goals with staff: Yes. ?Medical problems stabilized or resolved: Yes. ?Denies suicidal/homicidal ideation: Yes. ?Issues/concerns per patient self-inventory: No. ?Other: None ?

## 2021-11-09 NOTE — Progress Notes (Signed)
[facility] MD Progress Note ? ?11/09/2021 10:28 AM ?Alicia Rogers  ?MRN:  [id number] ?Subjective: Patient is seen on rounds today.  She continues to do well.  The personal care home accepted her but there is issues with money with the family and social work informed the family that if she does not go to the personal Care home she will have to be discharged back to them.  Social work states they are working on it.  She has no complaints except for her hands as usual.  She had COVID swab done that is not back yet and her TB test was negative. ? ?Principal Problem: Schizoaffective disorder, bipolar type ([place]) ?Diagnosis: Principal Problem: ?  Schizoaffective disorder, bipolar type ([place]) ? ?Total Time spent with patient: 15 minutes ? ?Past Psychiatric History: [facility] ? ?Past Medical History:  ?Past Medical History:  ?Diagnosis Date  ? Depression   ? Diabetes mellitus without complication ([place])   ? Diabetes mellitus, type II ([place])   ? Hypertension   ? Schizophrenia ([place])   ? History reviewed. No pertinent surgical history. ?Family History:  ?Family History  ?Problem Relation Age of Onset  ? Diabetes Mother   ? Glaucoma Mother   ? Heart disease Mother   ? Heart attack Mother   ? Depression Mother   ? Hypertension Mother   ? Diabetes Father   ? Glaucoma Father   ? Hypertension Father   ? Diabetes Sister   ? Depression Sister   ? Post-traumatic stress disorder Sister   ? ? ?Social History:  ?Social History  ? ?Substance and Sexual Activity  ?Alcohol Use No  ? Alcohol/week: 0.0 standard drinks  ?   ?Social History  ? ?Substance and Sexual Activity  ?Drug Use No  ?  ?Social History  ? ?Socioeconomic History  ? Marital status: Single  ?  Spouse name: Not on file  ? Number of children: Not on file  ? Years of education: Not on file  ? Highest education level: Not on file  ?Occupational History  ? Not on file  ?Tobacco Use  ? Smoking status: Every Day  ?  Packs/day: 1.00  ?  Types: Cigarettes  ?  Start date:  04/21/1978  ? Smokeless tobacco: Never  ?Vaping Use  ? Vaping Use: Never used  ?Substance and Sexual Activity  ? Alcohol use: No  ?  Alcohol/week: 0.0 standard drinks  ? Drug use: No  ? Sexual activity: Not Currently  ?Other Topics Concern  ? Not on file  ?Social History Narrative  ? Not on file  ? ?Social Determinants of Health  ? ?Financial Resource Strain: Not on file  ?Food Insecurity: Not on file  ?Transportation Needs: Not on file  ?Physical Activity: Not on file  ?Stress: Not on file  ?Social Connections: Not on file  ? ?Additional Social History:  ?  ?  ?  ?  ?  ?  ?  ?  ?  ?  ?  ? ?Sleep: Good ? ?Appetite:  Good ? ?Current Medications: ?Current Facility-Administered Medications  ?Medication Dose Route Frequency Provider Last Rate Last Admin  ? acetaminophen (TYLENOL) tablet 650 mg  650 mg Oral Q6H PRN Caroline Sauger, NP   650 mg at 11/07/21 2039  ? alum & mag hydroxide-simeth (MAALOX/MYLANTA) 200-200-20 MG/5ML suspension 30 mL  30 mL Oral Q4H PRN Parks Ranger, DO   30 mL at 10/01/21 2215  ? cyclobenzaprine (FLEXERIL) tablet 10 mg  10 mg Oral  QHS Parks Ranger, DO   10 mg at 11/08/21 2125  ? diltiazem (CARDIZEM CD) 24 hr capsule 360 mg  360 mg Oral Daily Parks Ranger, DO   360 mg at 11/09/21 0900  ? escitalopram (LEXAPRO) tablet 10 mg  10 mg Oral Daily Parks Ranger, DO   10 mg at 11/09/21 0900  ? feeding supplement (GLUCERNA SHAKE) (GLUCERNA SHAKE) liquid 237 mL  237 mL Oral TID BM Clapacs, John T, MD   237 mL at 11/09/21 0900  ? Glycerin (Adult) 2 g suppository 1 suppository  1 suppository Rectal Daily PRN Parks Ranger, DO   1 suppository at 10/19/21 1538  ? hydrALAZINE (APRESOLINE) tablet 50 mg  50 mg Oral TID Parks Ranger, DO   50 mg at 11/09/21 4270  ? insulin aspart (novoLOG) injection 4 Units  4 Units Subcutaneous TID WC Parks Ranger, DO   4 Units at 11/09/21 6237  ? linagliptin (TRADJENTA) tablet 5 mg  5 mg Oral Daily  Parks Ranger, DO   5 mg at 11/09/21 0900  ? multivitamin (RENA-VIT) tablet 1 tablet  1 tablet Oral QHS Parks Ranger, DO   1 tablet at 11/08/21 2125  ? OLANZapine (ZYPREXA) tablet 10 mg  10 mg Oral QHS Parks Ranger, DO   10 mg at 11/08/21 2125  ? ondansetron (ZOFRAN) tablet 4 mg  4 mg Oral Q6H PRN Parks Ranger, DO      ? Or  ? ondansetron (ZOFRAN) injection 4 mg  4 mg Intravenous Q6H PRN Parks Ranger, DO      ? polyethylene glycol (MIRALAX / GLYCOLAX) packet 17 g  17 g Oral QPC breakfast Parks Ranger, DO   17 g at 11/09/21 6283  ? senna-docusate (Senokot-S) tablet 1 tablet  1 tablet Oral QHS Parks Ranger, DO   1 tablet at 11/08/21 2125  ? sorbitol, milk of mag, mineral oil, glycerin (SMOG) enema  960 mL Rectal Daily PRN Parks Ranger, DO   960 mL at 10/19/21 2202  ? traZODone (DESYREL) tablet 100 mg  100 mg Oral QHS Parks Ranger, DO   100 mg at 11/08/21 2125  ? ? ?Lab Results:  ?Results for orders placed or performed during the hospital encounter of 09/29/21 (from the past 48 hour(s))  ?Glucose, capillary     Status: Abnormal  ? Collection Time: 11/07/21 11:50 AM  ?Result Value Ref Range  ? Glucose-Capillary 107 (H) 70 - 99 mg/dL  ?  Comment: Glucose reference range applies only to samples taken after fasting for at least 8 hours.  ?Glucose, capillary     Status: Abnormal  ? Collection Time: 11/07/21  3:53 PM  ?Result Value Ref Range  ? Glucose-Capillary 137 (H) 70 - 99 mg/dL  ?  Comment: Glucose reference range applies only to samples taken after fasting for at least 8 hours.  ?Creatinine, serum     Status: Abnormal  ? Collection Time: 11/08/21  6:50 AM  ?Result Value Ref Range  ? Creatinine, Ser 2.14 (H) 0.44 - 1.00 mg/dL  ? GFR, Estimated 26 (L) >60 mL/min  ?  Comment: (NOTE) ?Calculated using the CKD-EPI Creatinine Equation (2021) ?Performed at [facility], [place], ?[place] [address] ?   ?Glucose, capillary     Status: Abnormal  ? Collection Time: 11/08/21  7:45 AM  ?Result Value Ref Range  ? Glucose-Capillary 117 (H) 70 - 99 mg/dL  ?  Comment: Glucose reference range applies only to samples taken after fasting for at least 8 hours.  ?Glucose, capillary     Status: None  ? Collection Time: 11/08/21 11:40 AM  ?Result Value Ref Range  ? Glucose-Capillary 92 70 - 99 mg/dL  ?  Comment: Glucose reference range applies only to samples taken after fasting for at least 8 hours.  ?Glucose, capillary     Status: Abnormal  ? Collection Time: 11/08/21  4:23 PM  ?Result Value Ref Range  ? Glucose-Capillary 183 (H) 70 - 99 mg/dL  ?  Comment: Glucose reference range applies only to samples taken after fasting for at least 8 hours.  ?Glucose, capillary     Status: Abnormal  ? Collection Time: 11/09/21  7:46 AM  ?Result Value Ref Range  ? Glucose-Capillary 119 (H) 70 - 99 mg/dL  ?  Comment: Glucose reference range applies only to samples taken after fasting for at least 8 hours.  ? ? ?Blood Alcohol level:  ?Lab Results  ?Component Value Date  ? ETH <10 09/20/2021  ? ETH <10 10/13/2020  ? ? ?Metabolic Disorder Labs: ?Lab Results  ?Component Value Date  ? HGBA1C 8.0 (H) 09/27/2021  ? MPG 182.9 09/27/2021  ? MPG 180.03 10/13/2020  ? ?No results found for: PROLACTIN ?Lab Results  ?Component Value Date  ? CHOL 267 (H) 10/13/2020  ? TRIG 180 (H) 10/13/2020  ? HDL 86 10/13/2020  ? CHOLHDL 3.1 10/13/2020  ? VLDL 36 10/13/2020  ? LDLCALC 145 (H) 10/13/2020  ? LDLCALC 158 (H) 02/29/2020  ? ? ?Physical Findings: ?AIMS:  , ,  ,  ,    ?CIWA:    ?COWS:    ? ?Musculoskeletal: ?Strength & Muscle Tone: within normal limits ?Gait & Station: normal ?Patient leans: N/A ? ?Psychiatric Specialty Exam: ? ?Presentation  ?General Appearance: Bizarre; Disheveled ? ?Eye Contact:Fair ? ?Speech:Garbled ? ?Speech Volume:Decreased ? ?Handedness:Right ? ? ?Mood and Affect  ?Mood:No data recorded ?Affect:Flat ? ? ?Thought Process  ?Thought  Processes:Disorganized ? ?Descriptions of Associations:Loose ? ?Orientation:Partial ? ?Thought Content:Illogical ? ?History of Schizophrenia/Schizoaffective disorder:Yes ? ?Duration of Psychotic Symptoms:Greater than

## 2021-11-09 NOTE — BHH Counselor (Signed)
CSW spoke with Joy at [facility] who stated that pt is able to come at any time, she would need additional funding and family's input regarding arrival time to follow through with the resident acceptance and medical record information.  ? ?CSW will follow up with pt's family regarding transportation and financial support as needed and fax over additional referral forms.  ? ?Ralph Benavidez [place], MSW, LCSW-A ?4/10/20239:20 AM  ?

## 2021-11-09 NOTE — Progress Notes (Signed)
Recreation Therapy Notes ? ?Date: 11/09/2021 ?  ?Time: 2:00 pm  ?  ?Location:  Court yard ?  ?Behavioral response: N/A ?  ?Intervention Topic: Strengths    ?  ?Discussion/Intervention: ?Patient refused to attend group. ?  ?Clinical Observations/Feedback:  ?Patient refused to attend group. ?  ?[name] LRT/CTRS ? ? ? ? ? ? ? ?Alicia Rogers ?11/09/2021 2:15 PM ?

## 2021-11-10 LAB — GLUCOSE, CAPILLARY
Glucose-Capillary: 141 mg/dL — ABNORMAL HIGH (ref 70–99)
Glucose-Capillary: 71 mg/dL (ref 70–99)

## 2021-11-10 MED ORDER — DILTIAZEM HCL ER COATED BEADS 360 MG PO CP24: 360.0000 mg | ORAL_CAPSULE | Freq: Every day | ORAL | 3 refills | Status: DC

## 2021-11-10 MED ORDER — LINAGLIPTIN 5 MG PO TABS
5.0000 mg | ORAL_TABLET | Freq: Every day | ORAL | 3 refills | Status: DC
Start: 2021-11-11 — End: 2022-08-31

## 2021-11-10 MED ORDER — HUMALOG 100 UNIT/ML IJ SOLN: 4.0000 [IU] | Freq: Three times a day (TID) | INTRAMUSCULAR | 0 refills | Status: DC

## 2021-11-10 MED ORDER — TRAZODONE HCL 100 MG PO TABS: 100.0000 mg | ORAL_TABLET | Freq: Every day | ORAL | 3 refills | Status: DC

## 2021-11-10 MED ORDER — ESCITALOPRAM OXALATE 10 MG PO TABS: 10.0000 mg | ORAL_TABLET | Freq: Every day | ORAL | 3 refills | Status: DC

## 2021-11-10 MED ORDER — RENA-VITE PO TABS
1.0000 | ORAL_TABLET | Freq: Every day | ORAL | 3 refills | Status: DC
Start: 2021-11-10 — End: 2022-08-31

## 2021-11-10 MED ORDER — CYCLOBENZAPRINE HCL 10 MG PO TABS: 10.0000 mg | ORAL_TABLET | Freq: Every day | ORAL | 0 refills | Status: DC

## 2021-11-10 MED ORDER — OLANZAPINE 10 MG PO TABS: 10.0000 mg | ORAL_TABLET | Freq: Every day | ORAL | 3 refills | Status: DC

## 2021-11-10 NOTE — Progress Notes (Signed)
Patient alert and oriented x 4.  Affect is pleasant , cooperative and calm. Denies anxiety, SI/HI or AVH.  Patient states she will try to keep herself safe when she gets home. ?Reviewed discharge instructions with patient including medication and prescriptions.  Questions answered and understanding verbalized.  Discharge packet given to patient.  All belongings returned to patient after verification completed by staff.   ?Patient escorted by staff off unit at this time stable without complaint.  ?

## 2021-11-10 NOTE — Plan of Care (Signed)
?  Problem: Group Participation ?Goal: STG - Patient will engage in groups without prompting or encouragement from LRT x3 group sessions within 5 recreation therapy group sessions ?Description: STG - Patient will engage in groups without prompting or encouragement from LRT x3 group sessions within 5 recreation therapy group sessions ?Outcome: Completed/Met ?  ?

## 2021-11-10 NOTE — BHH Counselor (Signed)
CSW contacted pt's sister, Jackalyn Haith, to confirm pick up time for transportation at discharge. There was no answer, CSW left voicemail regarding inquiry with contact information to reach back out at.  ? ?Tania Perrott [place], MSW, LCSW-A ?4/11/202310:18 AM  ?

## 2021-11-10 NOTE — [place] FL2 (Signed)
?[place] MEDICAID FL2 LEVEL OF CARE SCREENING TOOL  ?  ? ?IDENTIFICATION  ?Patient Name: ?Alicia Rogers Birthdate: [birth year] Sex: female Admission Date (Current Location): ?09/29/2021  ?[place] and [place] Number: ? [place] ?  Facility and Address:  ?[facility], [address] [place], [place] [address] ?     Provider Number: ?[id number]  ?Attending Physician Name and Address:  ?Parks Ranger,* ? Relative Name and Phone Number:  ?Narcissa Melder (Sister)   [phone] ?   ?Current Level of Care: ?Hospital Recommended Level of Care: ?[place] Prior Approval Number: ?  ? ?Date Approved/Denied: ?  PASRR Number: ?screen still running, MUST ID: [id number] ? ?Discharge Plan: ?Other (Comment) [facility]) ?  ? ?Current Diagnoses: ?Patient Active Problem List  ? Diagnosis Date Noted  ? Schizoaffective disorder, bipolar type ([place]) 09/29/2021  ? Hypertensive urgency 09/28/2021  ? Acute metabolic encephalopathy 02/58/5277  ? Left lower lobe pneumonia 09/27/2021  ? Sinus bradycardia 09/27/2021  ? Anemia in chronic kidney disease (CKD) 09/27/2021  ? Left arm swelling 09/27/2021  ? 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  ? Essential hypertension 02/28/2020  ? Back pain 02/28/2020  ? CKD (chronic kidney disease), stage IV ([place]) 02/28/2020  ? Hematuria 02/28/2020  ? HLD (hyperlipidemia) 02/28/2020  ? Compulsive tobacco user syndrome 10/11/2014  ? 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  ? Claudication ([place]) 06/09/2014  ? Schizoaffective disorder, depressive type ([place]) 06/09/2014  ? Controlled type 2 diabetes mellitus without complication ([place]) 82/42/3536  ? ? ?Orientation RESPIRATION BLADDER Height & Weight   ?  ?Self ? Normal Incontinent Weight: 103 lb 8 oz (46.9 kg) ?Height:  5' 2.99" (160 cm)  ?BEHAVIORAL SYMPTOMS/MOOD NEUROLOGICAL BOWEL NUTRITION STATUS  ?   (N/A) Incontinent Diet (diabetic diet)  ?AMBULATORY STATUS COMMUNICATION OF NEEDS Skin    ?Independent Verbally   ?  ?  ?  ?    ?     ?     ? ? ?Personal Care Assistance Level of Assistance  ?Bathing Bathing Assistance: Limited assistance ?  ?  ?   ? ?Functional Limitations Info  ?Speech   ?  ?Speech Info: Impaired (delayed responses)  ? ? ?SPECIAL CARE FACTORS FREQUENCY  ?Blood pressure Blood Pressure Frequency: x2 ?  ?  ?  ?  ?  ?  ?   ? ? ?Contractures Contractures Info: Not present  ? ? ?Additional Factors Info  ?Code Status Code Status Info: FULL ?  ?  ?  ?  ?   ? ?Current Medications (11/10/2021):  This is the current hospital active medication list ?Current Facility-Administered Medications  ?Medication Dose Route Frequency Provider Last Rate Last Admin  ? acetaminophen (TYLENOL) tablet 650 mg  650 mg Oral Q6H PRN Caroline Sauger, NP   650 mg at 11/09/21 2109  ? alum & mag hydroxide-simeth (MAALOX/MYLANTA) 200-200-20 MG/5ML suspension 30 mL  30 mL Oral Q4H PRN Parks Ranger, DO   30 mL at 10/01/21 2215  ? cyclobenzaprine (FLEXERIL) tablet 10 mg  10 mg Oral QHS Parks Ranger, DO   10 mg at 11/09/21 2109  ? diltiazem (CARDIZEM CD) 24 hr capsule 360 mg  360 mg Oral Daily Parks Ranger, DO   360 mg at 11/09/21 0900  ? escitalopram (LEXAPRO) tablet 10 mg  10 mg Oral Daily Parks Ranger, DO   10 mg at 11/09/21 0900  ? feeding supplement (GLUCERNA SHAKE) (GLUCERNA SHAKE) liquid 237 mL  237  mL Oral TID BM Clapacs, Madie Reno, MD   237 mL at 11/09/21 2100  ? Glycerin (Adult) 2 g suppository 1 suppository  1 suppository Rectal Daily PRN Parks Ranger, DO   1 suppository at 10/19/21 1538  ? hydrALAZINE (APRESOLINE) tablet 50 mg  50 mg Oral TID Parks Ranger, DO   50 mg at 11/09/21 2108  ? insulin aspart (novoLOG) injection 4 Units  4 Units Subcutaneous TID WC Parks Ranger, DO   4 Units at 11/09/21 1202  ? linagliptin (TRADJENTA) tablet 5 mg  5 mg Oral Daily Parks Ranger, DO   5 mg at 11/09/21 0900  ? multivitamin (RENA-VIT)  tablet 1 tablet  1 tablet Oral QHS Parks Ranger, DO   1 tablet at 11/09/21 2109  ? OLANZapine (ZYPREXA) tablet 10 mg  10 mg Oral QHS Parks Ranger, DO   10 mg at 11/09/21 2109  ? ondansetron (ZOFRAN) tablet 4 mg  4 mg Oral Q6H PRN Parks Ranger, DO      ? Or  ? ondansetron (ZOFRAN) injection 4 mg  4 mg Intravenous Q6H PRN Parks Ranger, DO      ? polyethylene glycol (MIRALAX / GLYCOLAX) packet 17 g  17 g Oral QPC breakfast Parks Ranger, DO   17 g at 11/09/21 5625  ? senna-docusate (Senokot-S) tablet 1 tablet  1 tablet Oral QHS Parks Ranger, DO   1 tablet at 11/09/21 2108  ? sorbitol, milk of mag, mineral oil, glycerin (SMOG) enema  960 mL Rectal Daily PRN Parks Ranger, DO   960 mL at 10/19/21 2202  ? traZODone (DESYREL) tablet 100 mg  100 mg Oral QHS Parks Ranger, DO   100 mg at 11/09/21 2108  ? ? ? ?Discharge Medications: ?Please see discharge summary for a list of discharge medications. ? ?Relevant Imaging Results: ? ?Relevant Lab Results: ? ? 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 ?Wallie Lagrand A [place], LCSWA ? ? ? ? ?

## 2021-11-10 NOTE — Care Management Important Message (Signed)
Important Message ? ?Patient Details  ?Name: Alicia Rogers ?MRN: [id number] ?Date of Birth: May 21, 1961 ? ? ?Medicare Important Message Given:  Other (see comment) (Pt refused initial IM notice. CSW gave verbal receipt of important message from Medicare at discharge, but pt refused to sign.) ? ? ? ? ?Madison Albea A [place], LCSWA ?11/10/2021, 9:02 AM ?

## 2021-11-10 NOTE — BHH Suicide Risk Assessment (Signed)
[facility] Discharge Suicide Risk Assessment ? ? ?Principal Problem: Schizoaffective disorder, bipolar type ([place]) ?Discharge Diagnoses: Principal Problem: ?  Schizoaffective disorder, bipolar type ([place]) ? ? ?Total Time spent with patient: 15 minutes ? ?Musculoskeletal: ?Strength & Muscle Tone: within normal limits ?Gait & Station: normal ?Patient leans: N/A ? ?Psychiatric Specialty Exam ? ?Presentation  ?General Appearance: Bizarre; Disheveled ? ?Eye Contact:Fair ? ?Speech:Garbled ? ?Speech Volume:Decreased ? ?Handedness:Right ? ? ?Mood and Affect  ?Mood:No data recorded ?Duration of Depression Symptoms: Greater than two weeks ? ?Affect:Flat ? ? ?Thought Process  ?Thought Processes:Disorganized ? ?Descriptions of Associations:Loose ? ?Orientation:Partial ? ?Thought Content:Illogical ? ?History of Schizophrenia/Schizoaffective disorder:Yes ? ?Duration of Psychotic Symptoms:Greater than six months ? ?Hallucinations:No data recorded ?Ideas of Reference:No data recorded ?Suicidal Thoughts:No data recorded ?Homicidal Thoughts:No data recorded ? ?Sensorium  ?Memory:Immediate Poor ? ?Judgment:Impaired ? ?Insight:Lacking ? ? ?Executive Functions  ?Concentration:Poor ? ?Attention Span:Poor ? ?Recall:Poor ? ?Fund of Knowledge:Poor ? ?Language:Poor ? ? ?Psychomotor Activity  ?Psychomotor Activity:No data recorded ? ?Assets  ?Assets:Housing; Social Support ? ? ?Sleep  ?Sleep:No data recorded ? ? ?Blood pressure (!) 152/53, pulse 71, temperature 98.4 ?F (36.9 ?C), temperature source Oral, resp. rate 18, height 5' 2.99" (1.6 m), weight 46.9 kg, SpO2 100 %. Body mass index is 18.34 kg/m?. ? ?Mental Status Per Nursing Assessment::   ?On Admission:  NA ? ?Demographic Factors:  ?NA ? ?Loss Factors: ?NA ? ?Historical Factors: ?NA ? ?Risk Reduction Factors:   ?NA ? ?Continued Clinical Symptoms:  ?Schizophrenia:   Depressive state ? ?Cognitive Features That Contribute To Risk:  ?None   ? ?Suicide Risk:  ?Minimal: No identifiable suicidal  ideation.  Patients presenting with no risk factors but with morbid ruminations; may be classified as minimal risk based on the severity of the depressive symptoms ? ? Follow-up Information   ? ? [facility] Follow up.   ?Specialty: Urgent Care ?Why: Please attend walk-in hours to schedule your appointment for medication mangement on Monday, Wednesday-Friday from 7:30am-11am as needed. Please schedule your appointment for therapy on Mondays and Wednesdays from 7:30am-11am as needed. Thanks! ?Contact information: ?[address] ?[place] ?[phone] ? ?  ?  ? ?  ?  ? ?  ? ? ?Plan Of Care/Follow-up recommendations: [facility] ? ? ?Parks Ranger, DO ?11/10/2021, 10:13 AM ?

## 2021-11-10 NOTE — BHH Counselor (Signed)
CSW discussed follow up with pt at discharge. ?Patient declined follow up care for community mental health referral and was informed by [facility] that they would establish a medication management provider follow up at discharge.  ? ?Alicia Rogers, MSW, LCSW-A ?4/11/202310:43 AM  ?

## 2021-11-10 NOTE — Progress Notes (Signed)
Recreation Therapy Notes ? ?INPATIENT RECREATION TR PLAN ? ?Patient Details ?Name: Alicia Rogers ?MRN: [id number] ?DOB: [birth year] ?Today's Date: 11/10/2021 ? ?Rec Therapy Plan ?Is patient appropriate for Therapeutic Recreation?: Yes ?Treatment times per week: at least 3 ?Estimated Length of Stay: 5-7 days ?TR Treatment/Interventions: Group participation (Comment) ? ?Discharge Criteria ?Pt will be discharged from therapy if:: Discharged ?Treatment plan/goals/alternatives discussed and agreed upon by:: Patient/family ? ?Discharge Summary ?Short term goals set: Patient will engage in groups without prompting or encouragement from LRT x3 group sessions within 5 recreation therapy group sessions ?Short term goals met: Complete ?Progress toward goals comments: Groups attended ?Which groups?: Communication, Leisure education, Social skills, Stress management, AAA/T, Other (Comment), Goal setting (Self-care, Creative expressions, Happiness, Emotions) ?Reason goals not met: N/A ?Therapeutic equipment acquired: N/A ?Reason patient discharged from therapy: Discharge from hospital ?Pt/family agrees with progress & goals achieved: Yes ?Date patient discharged from therapy: 11/10/21 ? ? ?Eleana Tocco ?11/10/2021, 3:59 PM ?

## 2021-11-10 NOTE — Discharge Summary (Signed)
Physician Discharge Summary Note ? ?Patient:  Alicia Rogers is an 61 y.o., female ?MRN:  [id number] ?DOB:  [birth year] ?Patient phone:  [phone] (home)  ?Patient address:   ?[address] ?[place] [address],  ?Total Time spent with patient: 1 hour ? ?Date of Admission:  09/29/2021 ?Date of Discharge: 11/10/2021 ? ?Reason for Admission:  [place], 61 y.o. female, Per triage note, Pt here with IVC papers with [facility]. Pt does not know why she is in the emergency department, swelling noted to left hand, per first rn was seen earlier today. Per ivc papers pt has schizophrenia and has not been taking her medications.  During the assessment with writer, patient is slow to respond to questions. Patient appears to be thought blocking. She is unaware of what brings her to ER this evening. She believes its due to her swollen feet.  She admits to not taking her diabetes medication be doesn't mention any other missed medications, specifically psychiatric medications.   ?  ?Per TTS, pt does not know why she is in the emergency department, swelling noted to left hand, per first rn was seen earlier today. Per ivc papers pt has schizophrenia and has not been taking her medications.During assessment patient appears alert and oriented x4, calm and cooperative. When asked if patient understands why she is in the ED she reports "my legs are swollen, the police brought me here." Patient continues to be unsure why she is presenting to the ED but when asked if she is experiencing AH she reports "yes, they tell me that they don't want me to be around anyone or have anything." Patient also reports that she has not slept in over a week. Patient reports poor appetite "I stay hungry all the time." When asked about her medication compliance she reports "I haven't taken my medications ina couple of days because I had a hard time swallowing." Patient does report that she has a outpatient psychiatrist at [facility]. Patient denies SI/HI/VH, reports AH ? ?Principal Problem: Schizoaffective disorder, bipolar type ([place]) ?Discharge Diagnoses: Principal Problem: ?  Schizoaffective disorder, bipolar type ([place]) ? ? ?Past Psychiatric History: Schizophrenia and she had been going to [employer]. ? ?Past Medical History:  ?Past Medical History:  ?Diagnosis Date  ? Depression   ? Diabetes mellitus without complication ([place])   ? Diabetes mellitus, type II ([place])   ? Hypertension   ? Schizophrenia ([place])   ? History reviewed. No pertinent surgical history. ?Family History:  ?Family History  ?Problem Relation Age of Onset  ? Diabetes Mother   ? Glaucoma Mother   ? Heart disease Mother   ? Heart attack Mother   ? Depression Mother   ? Hypertension Mother   ? Diabetes Father   ? Glaucoma Father   ? Hypertension Father   ? Diabetes Sister   ? Depression Sister   ? Post-traumatic stress disorder Sister   ? ?Family Psychiatric  History: Unremarkable ?Social History:  ?Social History  ? ?Substance and Sexual Activity  ?Alcohol Use No  ? Alcohol/week: 0.0 standard drinks  ?   ?Social History  ? ?Substance and Sexual Activity  ?Drug Use No  ?  ?Social History  ? ?Socioeconomic History  ? Marital status: Single  ?  Spouse name: Not on file  ? Number of children: Not on file  ? Years of education: Not on file  ? Highest education level: Not on file  ?Occupational History  ?  Not on file  ?Tobacco Use  ? Smoking status: Every Day  ?  Packs/day: 1.00  ?  Types: Cigarettes  ?  Start date: 04/21/1978  ? Smokeless tobacco: Never  ?Vaping Use  ? Vaping Use: Never used  ?Substance and Sexual Activity  ? Alcohol use: No  ?  Alcohol/week: 0.0 standard drinks  ? Drug use: No  ? Sexual activity: Not Currently  ?Other Topics Concern  ? Not on file  ?Social History Narrative  ? Not on file  ? ?Social Determinants of Health  ? ?Financial Resource Strain: Not on file  ?Food Insecurity: Not on file  ?Transportation Needs: Not on file   ?Physical Activity: Not on file  ?Stress: Not on file  ?Social Connections: Not on file  ? ? ?Hospital Course:  Arnita was admitted under routine orders and precautions on an involuntary basis.  A few days later she signed voluntarily.  She has chronic hypertension and kidney disease and was transferred to medicine for approximately 3 days where she received treatment for end-stage renal failure.  Her labs remained stable throughout her hospitalization.  She was started on Zyprexa and Lexapro was continued.  She received PT and OT.  She was pleasant and cooperative while on the unit.  She took her medications as prescribed and denies any side effects.  There is no evidence of EPS or TD.  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  Her blood pressures were on the high side and her Cardizem was titrated up to 360.  She will need to follow up with nephrology on a regular basis.  Initially, she responded to internal stimuli but after a few days on medications she denies any auditory or visual hallucinations.  Her appetite was adequate and her sleep was good.  It was felt that she maximized hospitalization she was discharged to a personal care home that has accepted her.  On the day of discharge she denied suicidal ideation or homicidal ideation, auditory or visual hallucinations.  Her judgment and insight were good. ? ?Physical Findings: ?AIMS:  , ,  ,  ,    ?CIWA:    ?COWS:    ? ?Musculoskeletal: ?Strength & Muscle Tone: within normal limits ?Gait & Station: normal ?Patient leans: N/A ? ? ?Psychiatric Specialty Exam: ? ?Presentation  ?General Appearance: Bizarre; Disheveled ? ?Eye Contact:Fair ? ?Speech:Garbled ? ?Speech Volume:Decreased ? ?Handedness:Right ? ? ?Mood and Affect  ?Mood:No data recorded ?Affect:Flat ? ? ?Thought Process  ?Thought Processes:Disorganized ? ?Descriptions of Associations:Loose ? ?Orientation:Partial ? ?Thought  Content:Illogical ? ?History of Schizophrenia/Schizoaffective disorder:Yes ? ?Duration of Psychotic Symptoms:Greater than six months ? ?Hallucinations:No data recorded ?Ideas of Reference:No data recorded ?Suicidal Thoughts:No data recorded ?Homicidal Thoughts:No data recorded ? ?Sensorium  ?Memory:Immediate Poor ? ?Judgment:Impaired ? ?Insight:Lacking ? ? ?Executive Functions  ?Concentration:Poor ? ?Attention Span:Poor ? ?Recall:Poor ? ?Fund of Knowledge:Poor ? ?Language:Poor ? ? ?Psychomotor Activity  ?Psychomotor Activity:No data recorded ? ?Assets  ?Assets:Housing; Social Support ? ? ?Sleep  ?Sleep:No data recorded ? ? ? ?Blood pressure (!) 152/53, pulse 71, temperature 98.4 ?F (36.9 ?C), temperature source Oral, resp. rate 18, height 5' 2.99" (1.6 m), weight 46.9 kg, SpO2 100 %. Body mass index is 18.34 kg/m?. ? ? ?Social History  ? ?Tobacco Use  ?Smoking Status Every Day  ? Packs/day: 1.00  ? Types: Cigarettes  ? Start date: 04/21/1978  ?Smokeless Tobacco Never  ? ?  Tobacco Cessation:  N/A, patient does not currently use tobacco products ? ? ?Blood Alcohol level:  ?Lab Results  ?Component Value Date  ? ETH <10 09/20/2021  ? ETH <10 10/13/2020  ? ? ?Metabolic Disorder Labs:  ?Lab Results  ?Component Value Date  ? HGBA1C 8.0 (H) 09/27/2021  ? MPG 182.9 09/27/2021  ? MPG 180.03 10/13/2020  ? ?No results found for: PROLACTIN ?Lab Results  ?Component Value Date  ? CHOL 267 (H) 10/13/2020  ? TRIG 180 (H) 10/13/2020  ? HDL 86 10/13/2020  ? CHOLHDL 3.1 10/13/2020  ? VLDL 36 10/13/2020  ? LDLCALC 145 (H) 10/13/2020  ? LDLCALC 158 (H) 02/29/2020  ? ? ?See Psychiatric Specialty Exam and Suicide Risk Assessment completed by Attending Physician prior to discharge. ? ?Discharge destination:  Personal care home. ? ?Is patient on multiple antipsychotic therapies at discharge:  No   ?Has Patient had three or more failed trials of antipsychotic monotherapy by history:  No ? ?Recommended Plan for Multiple Antipsychotic  Therapies: ?NA ? ? ?Allergies as of 11/10/2021   ? ?   Reactions  ? Penicillins   ? Rash  ? ?  ? ?  ?Medication List  ?  ? ?STOP taking these medications   ? ?amLODipine 10 MG tablet ?Commonly known as: NORVASC ?  ?cefdinir 3

## 2021-11-10 NOTE — BHH Group Notes (Signed)
[facility] Group Notes:  (Nursing/MHT/Case Management/Adjunct) ? ?Date:  11/10/2021  ?Time:  9:30 AM ? ?Type of Therapy:  Psychoeducational Skills ? ?Participation Level:  Minimal ? ?Participation Quality:  Appropriate ? ?Affect:  Appropriate ? ?Cognitive:  Appropriate ? ?Insight:  Appropriate ? ?Engagement in Group:  Engaged ? ?Modes of Intervention:  Discussion ? ?Summary of Progress/Problems: ?The pt. attended group but shared very little during discussion. The pt had to be called on and asked to speak before sharing anything. ?Juliette Alcide ?11/10/2021, 11:57 AM ?

## 2021-11-10 NOTE — Progress Notes (Signed)
?  [facility] Adult Case Management Discharge Plan : ? ?Will you be returning to the same living situation after discharge:  No. ?At discharge, do you have transportation home?: Yes,  pt's sister will be providing transportation ?Do you have the ability to pay for your medications: Yes,  pt has [facility] Medicare ? ?Release of information consent forms completed and in the chart;  Patient's signature needed at discharge. ? ?Patient to Follow up at: ? Follow-up Information   ? ? [facility] Follow up.   ?Specialty: Urgent Care ?Why: Though you declined follow up services, here is a referral you can utilize as needed. Please attend walk-in hours to schedule your appointment for medication mangement on Monday, Wednesday-Friday from 7:30am-11am as needed. Please schedule your appointment for therapy on Mondays and Wednesdays from 7:30am-11am as needed. Thanks! ?Contact information: ?[address] ?[place] ?[phone] ? ?  ?  ? ?  ?  ? ?  ? ? ?Next level of care provider has access to [facility] ? ?Safety Planning and Suicide Prevention discussed: Yes,  completed with pt's sister ? ?  ? ?Has patient been referred to the Quitline?: Patient refused referral ? ?Patient has been referred for addiction treatment: N/A ? ?Wilburt Messina A [place], LCSWA ?11/10/2021, 10:45 AM ?

## 2021-11-10 NOTE — Progress Notes (Signed)
Patient is calm and cooperative with assessment. Sheduled to be discharged today. She denies SI, HI, AVH, depression, anxiety and pain. Patient had an episode of incontinence in the morning, assistance provided with ADLs. Patient was showered and changed. Patient has been in the dayroom since breakfast and participated in group session. Medications administered as ordered. Patient remains safe on unit with Q 15 minute safety checks. ?

## 2021-11-11 LAB — GLUCOSE, CAPILLARY: Glucose-Capillary: 136 mg/dL — ABNORMAL HIGH (ref 70–99)

## 2021-11-25 ENCOUNTER — Other Ambulatory Visit: Payer: Self-pay | Admitting: Nephrology

## 2021-11-25 DIAGNOSIS — R809 Proteinuria, unspecified: Secondary | ICD-10-CM | POA: Insufficient documentation

## 2021-11-25 DIAGNOSIS — D631 Anemia in chronic kidney disease: Secondary | ICD-10-CM | POA: Insufficient documentation

## 2021-11-25 DIAGNOSIS — E1122 Type 2 diabetes mellitus with diabetic chronic kidney disease: Secondary | ICD-10-CM | POA: Insufficient documentation

## 2021-11-25 DIAGNOSIS — N1832 Chronic kidney disease, stage 3b: Secondary | ICD-10-CM

## 2021-11-25 DIAGNOSIS — F200 Paranoid schizophrenia: Secondary | ICD-10-CM | POA: Insufficient documentation

## 2021-11-25 DIAGNOSIS — D649 Anemia, unspecified: Secondary | ICD-10-CM | POA: Insufficient documentation

## 2021-11-25 DIAGNOSIS — I10 Essential (primary) hypertension: Secondary | ICD-10-CM | POA: Insufficient documentation

## 2021-12-01 ENCOUNTER — Ambulatory Visit
Admission: RE | Admit: 2021-12-01 | Discharge: 2021-12-01 | Disposition: A | Discharge status: Home / Self Care | Payer: Medicare Other | Source: Ambulatory Visit | Attending: Nephrology | Admitting: Nephrology

## 2021-12-01 DIAGNOSIS — N1832 Chronic kidney disease, stage 3b: Secondary | ICD-10-CM | POA: Insufficient documentation

## 2021-12-22 DIAGNOSIS — N1832 Chronic kidney disease, stage 3b: Secondary | ICD-10-CM | POA: Insufficient documentation

## 2021-12-22 HISTORY — DX: Chronic kidney disease, stage 3b: N18.32

## 2022-01-11 ENCOUNTER — Ambulatory Visit: Payer: Medicare Other | Attending: Neurology

## 2022-01-12 ENCOUNTER — Encounter (INDEPENDENT_AMBULATORY_CARE_PROVIDER_SITE_OTHER): Payer: Self-pay | Admitting: Vascular Surgery

## 2022-01-12 ENCOUNTER — Ambulatory Visit (INDEPENDENT_AMBULATORY_CARE_PROVIDER_SITE_OTHER): Payer: Medicare Other | Admitting: Vascular Surgery

## 2022-01-12 VITALS — BP 180/62 | HR 76 | Resp 17 | Ht 62.0 in | Wt 120.0 lb

## 2022-01-12 DIAGNOSIS — E1122 Type 2 diabetes mellitus with diabetic chronic kidney disease: Secondary | ICD-10-CM

## 2022-01-12 DIAGNOSIS — M7989 Other specified soft tissue disorders: Secondary | ICD-10-CM

## 2022-01-12 DIAGNOSIS — N184 Chronic kidney disease, stage 4 (severe): Secondary | ICD-10-CM

## 2022-01-12 DIAGNOSIS — I10 Essential (primary) hypertension: Secondary | ICD-10-CM

## 2022-01-12 NOTE — Assessment & Plan Note (Signed)
The patient has isolated swelling in her left hand where she has a severe contracture.  Her arm is really not that swollen so I do not think a lymphedema pump is going to be of much benefit.  Elevation would certainly help.  She also has fluid retention in her legs with her chronic kidney disease and compression socks there would help as well.  She should elevate her legs and her left arm as tolerated.  I have referred her to Dr. Peggye Ley at [facility] who is a hand specialist to see if there is any option for release of the tendons to try to get her fingers not contracted.  I think that would help her swelling as well potentially.  I do not really have that much to offer other than elevation.  She will return as needed

## 2022-01-12 NOTE — Progress Notes (Signed)
Patient ID: Alicia Rogers, female   DOB: [birth year], 61 y.o.   MRN: [id number]  Chief Complaint  Patient presents with   New Patient (Initial Visit)    Left arm swelling    HPI Alicia Rogers is a 61 y.o. female.  I am asked to see the patient by Dr. Melrose Nakayama for evaluation of left hand swelling.  This has been an on-and-off problem for the last year or 2.  Her hand is contracted and she really has no use in her left hand.  She moves the arm okay.  She does not really have much in the way of arm swelling and the swelling is really just in her hand and wrist.  She tried some sort of compression garment in the past per her report and says it did not help much.  She is not a great historian.  She lives in a group home but is here alone.  She had a negative DVT study in that left arm.  No right arm symptoms.  She does have some mild lower extremity swelling as well.     Past Medical History:  Diagnosis Date   Depression    Diabetes mellitus without complication ([place])    Diabetes mellitus, type II ([place])    Hypertension    Schizophrenia ([place])     Past Surgical History:  Procedure Laterality Date   colonscopy       Family History  Problem Relation Age of Onset   Diabetes Mother    Glaucoma Mother    Heart disease Mother    Heart attack Mother    Depression Mother    Hypertension Mother    Diabetes Father    Glaucoma Father    Hypertension Father    Diabetes Sister    Depression Sister    Post-traumatic stress disorder Sister       Social History   Tobacco Use   Smoking status: Every Day    Packs/day: 1.00    Types: Cigarettes    Start date: 04/21/1978   Smokeless tobacco: Never  Vaping Use   Vaping Use: Never used  Substance Use Topics   Alcohol use: No    Alcohol/week: 0.0 standard drinks of alcohol   Drug use: No     Allergies  Allergen Reactions   Penicillins     Rash    Current Outpatient Medications  Medication Sig Dispense Refill    acetaminophen (TYLENOL) 325 MG tablet Take by mouth.     alum & mag hydroxide-simeth (MAALOX/MYLANTA) 200-200-20 MG/5ML suspension Take by mouth.     aspirin EC 81 MG tablet Take by mouth.     cyclobenzaprine (FLEXERIL) 10 MG tablet Take 1 tablet (10 mg total) by mouth at bedtime. 30 tablet 0   dapagliflozin propanediol (FARXIGA) 5 MG TABS tablet Take by mouth.     diltiazem (CARDIZEM CD) 360 MG 24 hr capsule Take 1 capsule (360 mg total) by mouth daily. 30 capsule 3   escitalopram (LEXAPRO) 10 MG tablet Take 1 tablet (10 mg total) by mouth daily. 30 tablet 3   ferrous sulfate 325 (65 FE) MG tablet Take by mouth.     glycerin adult 2 g suppository Place rectally.     hydrALAZINE (APRESOLINE) 50 MG tablet Take 1 tablet (50 mg total) by mouth 3 (three) times daily.     hydrALAZINE (APRESOLINE) 50 MG tablet Take 1 tablet by mouth 3 (three) times daily.     insulin lispro (  HUMALOG) 100 UNIT/ML injection Inject 0.04 mLs (4 Units total) into the skin 3 (three) times daily with meals. 4 mL 0   INVEGA SUSTENNA injection SMARTSIG:117 Milligram(s) IM Once a Month     linagliptin (TRADJENTA) 5 MG TABS tablet Take 1 tablet (5 mg total) by mouth daily. 30 tablet 3   loperamide (IMODIUM) 2 MG capsule Take by mouth.     Lurasidone HCl 60 MG TABS Take by mouth.     melatonin 3 MG TABS tablet Take by mouth.     multivitamin (RENA-VIT) TABS tablet Take 1 tablet by mouth at bedtime. 30 tablet 3   multivitamin (RENA-VIT) TABS tablet Take by mouth.     nystatin (MYCOSTATIN) 100000 UNIT/ML suspension Swish and swallow 500,000 Units 4 (four) times daily     OLANZapine (ZYPREXA) 10 MG tablet Take 1 tablet (10 mg total) by mouth at bedtime. 30 tablet 3   OLANZapine (ZYPREXA) 10 MG tablet Take by mouth.     ondansetron (ZOFRAN) 4 MG tablet Take by mouth.     oxybutynin (DITROPAN XL) 15 MG 24 hr tablet TAKE 1 TABLET BY MOUTH ONCE DAILY FOR BLADDER *WEAR GLOVES IF CRUSHING*     polyethylene glycol powder  (GLYCOLAX/MIRALAX) 17 GM/SCOOP powder Take by mouth.     traZODone (DESYREL) 100 MG tablet Take 1 tablet (100 mg total) by mouth at bedtime. 30 tablet 3   No current facility-administered medications for this visit.      REVIEW OF SYSTEMS (Negative unless checked)  Constitutional: '[]'$ Weight loss  '[]'$ Fever  '[]'$ Chills Cardiac: '[]'$ Chest pain   '[]'$ Chest pressure   '[]'$ Palpitations   '[]'$ Shortness of breath when laying flat   '[]'$ Shortness of breath at rest   '[]'$ Shortness of breath with exertion. Vascular:  '[]'$ Pain in legs with walking   '[]'$ Pain in legs at rest   '[]'$ Pain in legs when laying flat   '[]'$ Claudication   '[]'$ Pain in feet when walking  '[]'$ Pain in feet at rest  '[]'$ Pain in feet when laying flat   '[]'$ History of DVT   '[]'$ Phlebitis   '[]'$ Swelling in legs   '[]'$ Varicose veins   '[]'$ Non-healing ulcers Pulmonary:   '[]'$ Uses home oxygen   '[]'$ Productive cough   '[]'$ Hemoptysis   '[]'$ Wheeze  '[]'$ COPD   '[]'$ Asthma Neurologic:  '[]'$ Dizziness  '[]'$ Blackouts   '[]'$ Seizures   '[]'$ History of stroke   '[]'$ History of TIA  '[]'$ Aphasia   '[]'$ Temporary blindness   '[]'$ Dysphagia   '[x]'$ Weakness or numbness in arms   '[]'$ Weakness or numbness in legs Musculoskeletal:  '[]'$ Arthritis   '[]'$ Joint swelling   '[]'$ Joint pain   '[]'$ Low back pain Hematologic:  '[]'$ Easy bruising  '[]'$ Easy bleeding   '[]'$ Hypercoagulable state   '[]'$ Anemic  '[]'$ Hepatitis Gastrointestinal:  '[]'$ Blood in stool   '[]'$ Vomiting blood  '[]'$ Gastroesophageal reflux/heartburn   '[]'$ Abdominal pain Genitourinary:  '[]'$ Chronic kidney disease   '[]'$ Difficult urination  '[]'$ Frequent urination  '[]'$ Burning with urination   '[]'$ Hematuria Skin:  '[]'$ Rashes   '[]'$ Ulcers   '[]'$ Wounds Psychological:  '[]'$ History of anxiety   '[x]'$  History of major depression.    Physical Exam BP (!) 180/62 (BP Location: Right Arm)   Pulse 76   Resp 17   Ht '5\' 2"'$  (1.575 m)   Wt 120 lb (54.4 kg)   BMI 21.95 kg/m  Gen:  WD/WN, NAD.  Appears older than stated age Head: [place]/AT, No temporalis wasting.  Ear/Nose/Throat: Hearing grossly intact, nares w/o erythema or drainage,  chronic tongue protrusion Eyes: Conjunctiva clear, sclera non-icteric  Neck: trachea midline.  No JVD.  Pulmonary:  Good air movement, respirations not labored, no use of accessory muscles  Cardiac: RRR, no JVD Vascular:  Vessel Right Left  Radial Palpable Palpable                                   Gastrointestinal:. No masses, surgical incisions, or scars. Musculoskeletal: M/S 5/5 throughout.  Extremities without ischemic changes.  Left hand is contracted in all 4 fingers with inability to straighten them.  Left hand has 1-2+ swelling.  1-2+ bilateral lower extremity edema is also present.  The swelling in the left arm is really located in the wrist and hand only and not in the upper arm Neurologic: Sensation grossly intact in extremities.  Symmetrical.  Motor exam as listed above. Psychiatric: Judgment and insight are fairly poor Dermatologic: No rashes or ulcers noted.  No cellulitis or open wounds.    Radiology No results found.  Labs Recent Results (from the past 2160 hour(s))  Glucose, capillary     Status: Abnormal   Collection Time: 10/14/21 11:52 AM  Result Value Ref Range   Glucose-Capillary 241 (H) 70 - 99 mg/dL    Comment: Glucose reference range applies only to samples taken after fasting for at least 8 hours.  Glucose, capillary     Status: None   Collection Time: 10/14/21  4:19 PM  Result Value Ref Range   Glucose-Capillary 78 70 - 99 mg/dL    Comment: Glucose reference range applies only to samples taken after fasting for at least 8 hours.  Glucose, capillary     Status: Abnormal   Collection Time: 10/14/21  9:07 PM  Result Value Ref Range   Glucose-Capillary 154 (H) 70 - 99 mg/dL    Comment: Glucose reference range applies only to samples taken after fasting for at least 8 hours.  Glucose, capillary     Status: Abnormal   Collection Time: 10/15/21  7:42 AM  Result Value Ref Range   Glucose-Capillary 158 (H) 70 - 99 mg/dL    Comment: Glucose reference  range applies only to samples taken after fasting for at least 8 hours.  Glucose, capillary     Status: Abnormal   Collection Time: 10/15/21 11:43 AM  Result Value Ref Range   Glucose-Capillary 293 (H) 70 - 99 mg/dL    Comment: Glucose reference range applies only to samples taken after fasting for at least 8 hours.  Glucose, capillary     Status: Abnormal   Collection Time: 10/15/21  4:01 PM  Result Value Ref Range   Glucose-Capillary 61 (L) 70 - 99 mg/dL    Comment: Glucose reference range applies only to samples taken after fasting for at least 8 hours.  Glucose, capillary     Status: Abnormal   Collection Time: 10/15/21  9:07 PM  Result Value Ref Range   Glucose-Capillary 150 (H) 70 - 99 mg/dL    Comment: Glucose reference range applies only to samples taken after fasting for at least 8 hours.  Glucose, capillary     Status: Abnormal   Collection Time: 10/16/21  7:44 AM  Result Value Ref Range   Glucose-Capillary 183 (H) 70 - 99 mg/dL    Comment: Glucose reference range applies only to samples taken after fasting for at least 8 hours.  Glucose, capillary     Status: Abnormal   Collection Time: 10/16/21 11:41 AM  Result Value Ref Range   Glucose-Capillary 178 (H) 70 -  99 mg/dL    Comment: Glucose reference range applies only to samples taken after fasting for at least 8 hours.  Glucose, capillary     Status: Abnormal   Collection Time: 10/16/21  4:24 PM  Result Value Ref Range   Glucose-Capillary 151 (H) 70 - 99 mg/dL    Comment: Glucose reference range applies only to samples taken after fasting for at least 8 hours.  Glucose, capillary     Status: Abnormal   Collection Time: 10/16/21  8:35 PM  Result Value Ref Range   Glucose-Capillary 106 (H) 70 - 99 mg/dL    Comment: Glucose reference range applies only to samples taken after fasting for at least 8 hours.   Comment 1 Notify RN   Glucose, capillary     Status: Abnormal   Collection Time: 10/17/21  7:50 AM  Result Value  Ref Range   Glucose-Capillary 150 (H) 70 - 99 mg/dL    Comment: Glucose reference range applies only to samples taken after fasting for at least 8 hours.  Glucose, capillary     Status: Abnormal   Collection Time: 10/17/21 11:42 AM  Result Value Ref Range   Glucose-Capillary 219 (H) 70 - 99 mg/dL    Comment: Glucose reference range applies only to samples taken after fasting for at least 8 hours.  Glucose, capillary     Status: Abnormal   Collection Time: 10/17/21  4:21 PM  Result Value Ref Range   Glucose-Capillary 116 (H) 70 - 99 mg/dL    Comment: Glucose reference range applies only to samples taken after fasting for at least 8 hours.  Glucose, capillary     Status: Abnormal   Collection Time: 10/17/21  8:47 PM  Result Value Ref Range   Glucose-Capillary 103 (H) 70 - 99 mg/dL    Comment: Glucose reference range applies only to samples taken after fasting for at least 8 hours.   Comment 1 Notify RN    Comment 2 Document in Chart   Glucose, capillary     Status: Abnormal   Collection Time: 10/18/21 12:01 AM  Result Value Ref Range   Glucose-Capillary 245 (H) 70 - 99 mg/dL    Comment: Glucose reference range applies only to samples taken after fasting for at least 8 hours.   Comment 1 Document in Chart    Comment 2 Call MD NNP PA CNM   Creatinine, serum     Status: Abnormal   Collection Time: 10/18/21  5:29 AM  Result Value Ref Range   Creatinine, Ser 2.45 (H) 0.44 - 1.00 mg/dL   GFR, Estimated 22 (L) >60 mL/min    Comment: (NOTE) Calculated using the CKD-EPI Creatinine Equation (2021) Performed at [facility], [address] [place], [place] [address]   Glucose, capillary     Status: Abnormal   Collection Time: 10/18/21  7:38 AM  Result Value Ref Range   Glucose-Capillary 131 (H) 70 - 99 mg/dL    Comment: Glucose reference range applies only to samples taken after fasting for at least 8 hours.  Glucose, capillary     Status: Abnormal   Collection Time: 10/18/21  11:44 AM  Result Value Ref Range   Glucose-Capillary 141 (H) 70 - 99 mg/dL    Comment: Glucose reference range applies only to samples taken after fasting for at least 8 hours.  Glucose, capillary     Status: Abnormal   Collection Time: 10/18/21  4:20 PM  Result Value Ref Range   Glucose-Capillary  177 (H) 70 - 99 mg/dL    Comment: Glucose reference range applies only to samples taken after fasting for at least 8 hours.  Glucose, capillary     Status: Abnormal   Collection Time: 10/18/21  7:32 PM  Result Value Ref Range   Glucose-Capillary 147 (H) 70 - 99 mg/dL    Comment: Glucose reference range applies only to samples taken after fasting for at least 8 hours.  Glucose, capillary     Status: Abnormal   Collection Time: 10/19/21  7:51 AM  Result Value Ref Range   Glucose-Capillary 167 (H) 70 - 99 mg/dL    Comment: Glucose reference range applies only to samples taken after fasting for at least 8 hours.  Glucose, capillary     Status: Abnormal   Collection Time: 10/19/21 11:15 AM  Result Value Ref Range   Glucose-Capillary 421 (H) 70 - 99 mg/dL    Comment: Glucose reference range applies only to samples taken after fasting for at least 8 hours.  Glucose, capillary     Status: Abnormal   Collection Time: 10/19/21 12:13 PM  Result Value Ref Range   Glucose-Capillary 316 (H) 70 - 99 mg/dL    Comment: Glucose reference range applies only to samples taken after fasting for at least 8 hours.  CBC with Differential/Platelet     Status: Abnormal   Collection Time: 10/19/21  1:13 PM  Result Value Ref Range   WBC 12.2 (H) 4.0 - 10.5 K/uL   RBC 3.65 (L) 3.87 - 5.11 MIL/uL   Hemoglobin 9.7 (L) 12.0 - 15.0 g/dL   HCT 31.7 (L) 36.0 - 46.0 %   MCV 86.8 80.0 - 100.0 fL   MCH 26.6 26.0 - 34.0 pg   MCHC 30.6 30.0 - 36.0 g/dL   RDW 14.1 11.5 - 15.5 %   Platelets 411 (H) 150 - 400 K/uL   nRBC 0.0 0.0 - 0.2 %   Neutrophils Relative % 73 %   Neutro Abs 8.9 (H) 1.7 - 7.7 K/uL   Lymphocytes  Relative 13 %   Lymphs Abs 1.5 0.7 - 4.0 K/uL   Monocytes Relative 14 %   Monocytes Absolute 1.7 (H) 0.1 - 1.0 K/uL   Eosinophils Relative 0 %   Eosinophils Absolute 0.0 0.0 - 0.5 K/uL   Basophils Relative 0 %   Basophils Absolute 0.0 0.0 - 0.1 K/uL   Immature Granulocytes 0 %   Abs Immature Granulocytes 0.04 0.00 - 0.07 K/uL    Comment: Performed at [facility], [address] [place], [place] [address]  Comprehensive metabolic panel     Status: Abnormal   Collection Time: 10/19/21  1:13 PM  Result Value Ref Range   Sodium 137 135 - 145 mmol/L   Potassium 4.2 3.5 - 5.1 mmol/L   Chloride 98 98 - 111 mmol/L   CO2 26 22 - 32 mmol/L   Glucose, Bld 192 (H) 70 - 99 mg/dL    Comment: Glucose reference range applies only to samples taken after fasting for at least 8 hours.   BUN 78 (H) 8 - 23 mg/dL   Creatinine, Ser 2.59 (H) 0.44 - 1.00 mg/dL   Calcium 9.6 8.9 - 10.3 mg/dL   Total Protein 7.9 6.5 - 8.1 g/dL   Albumin 3.1 (L) 3.5 - 5.0 g/dL   AST 27 15 - 41 U/L   ALT 20 0 - 44 U/L   Alkaline Phosphatase 93 38 - 126 U/L   Total Bilirubin  0.3 0.3 - 1.2 mg/dL   GFR, Estimated 20 (L) >60 mL/min    Comment: (NOTE) Calculated using the CKD-EPI Creatinine Equation (2021)    Anion gap 13 5 - 15    Comment: Performed at [facility], [address] [place], [place] [address]  Lactic acid, plasma     Status: None   Collection Time: 10/19/21  1:13 PM  Result Value Ref Range   Lactic Acid, Venous 1.5 0.5 - 1.9 mmol/L    Comment: Performed at [facility], [address] [place], [place] [address]  Glucose, capillary     Status: Abnormal   Collection Time: 10/19/21  4:45 PM  Result Value Ref Range   Glucose-Capillary 129 (H) 70 - 99 mg/dL    Comment: Glucose reference range applies only to samples taken after fasting for at least 8 hours.  Glucose, capillary     Status: Abnormal   Collection Time: 10/19/21  7:31 PM  Result Value Ref Range    Glucose-Capillary 179 (H) 70 - 99 mg/dL    Comment: Glucose reference range applies only to samples taken after fasting for at least 8 hours.  Glucose, capillary     Status: Abnormal   Collection Time: 10/20/21  8:16 AM  Result Value Ref Range   Glucose-Capillary 211 (H) 70 - 99 mg/dL    Comment: Glucose reference range applies only to samples taken after fasting for at least 8 hours.  Glucose, capillary     Status: Abnormal   Collection Time: 10/20/21 12:08 PM  Result Value Ref Range   Glucose-Capillary 231 (H) 70 - 99 mg/dL    Comment: Glucose reference range applies only to samples taken after fasting for at least 8 hours.  Glucose, capillary     Status: Abnormal   Collection Time: 10/20/21  4:54 PM  Result Value Ref Range   Glucose-Capillary 148 (H) 70 - 99 mg/dL    Comment: Glucose reference range applies only to samples taken after fasting for at least 8 hours.  Comprehensive metabolic panel     Status: Abnormal   Collection Time: 10/21/21  6:05 AM  Result Value Ref Range   Sodium 133 (L) 135 - 145 mmol/L   Potassium 3.6 3.5 - 5.1 mmol/L   Chloride 99 98 - 111 mmol/L   CO2 28 22 - 32 mmol/L   Glucose, Bld 155 (H) 70 - 99 mg/dL    Comment: Glucose reference range applies only to samples taken after fasting for at least 8 hours.   BUN 55 (H) 8 - 23 mg/dL   Creatinine, Ser 2.17 (H) 0.44 - 1.00 mg/dL   Calcium 8.7 (L) 8.9 - 10.3 mg/dL   Total Protein 6.9 6.5 - 8.1 g/dL   Albumin 2.6 (L) 3.5 - 5.0 g/dL   AST 18 15 - 41 U/L   ALT 15 0 - 44 U/L   Alkaline Phosphatase 87 38 - 126 U/L   Total Bilirubin 0.5 0.3 - 1.2 mg/dL   GFR, Estimated 25 (L) >60 mL/min    Comment: (NOTE) Calculated using the CKD-EPI Creatinine Equation (2021)    Anion gap 6 5 - 15    Comment: Performed at [facility], [address] [place], [place] [address]  Glucose, capillary     Status: Abnormal   Collection Time: 10/21/21  7:55 AM  Result Value Ref Range   Glucose-Capillary 141 (H) 70 -  99 mg/dL    Comment: Glucose reference range applies only  to samples taken after fasting for at least 8 hours.  Glucose, capillary     Status: Abnormal   Collection Time: 10/21/21 11:41 AM  Result Value Ref Range   Glucose-Capillary 215 (H) 70 - 99 mg/dL    Comment: Glucose reference range applies only to samples taken after fasting for at least 8 hours.  Glucose, capillary     Status: Abnormal   Collection Time: 10/21/21  4:19 PM  Result Value Ref Range   Glucose-Capillary 180 (H) 70 - 99 mg/dL    Comment: Glucose reference range applies only to samples taken after fasting for at least 8 hours.  Glucose, capillary     Status: Abnormal   Collection Time: 10/21/21 10:14 PM  Result Value Ref Range   Glucose-Capillary 234 (H) 70 - 99 mg/dL    Comment: Glucose reference range applies only to samples taken after fasting for at least 8 hours.   Comment 1 Notify RN   Glucose, capillary     Status: Abnormal   Collection Time: 10/22/21  7:53 AM  Result Value Ref Range   Glucose-Capillary 194 (H) 70 - 99 mg/dL    Comment: Glucose reference range applies only to samples taken after fasting for at least 8 hours.  Glucose, capillary     Status: Abnormal   Collection Time: 10/22/21 11:32 AM  Result Value Ref Range   Glucose-Capillary 143 (H) 70 - 99 mg/dL    Comment: Glucose reference range applies only to samples taken after fasting for at least 8 hours.  Glucose, capillary     Status: Abnormal   Collection Time: 10/22/21  4:58 PM  Result Value Ref Range   Glucose-Capillary 142 (H) 70 - 99 mg/dL    Comment: Glucose reference range applies only to samples taken after fasting for at least 8 hours.  Glucose, capillary     Status: Abnormal   Collection Time: 10/23/21  7:44 AM  Result Value Ref Range   Glucose-Capillary 151 (H) 70 - 99 mg/dL    Comment: Glucose reference range applies only to samples taken after fasting for at least 8 hours.  Glucose, capillary     Status: Abnormal   Collection  Time: 10/23/21 11:40 AM  Result Value Ref Range   Glucose-Capillary 140 (H) 70 - 99 mg/dL    Comment: Glucose reference range applies only to samples taken after fasting for at least 8 hours.  Glucose, capillary     Status: Abnormal   Collection Time: 10/23/21  4:31 PM  Result Value Ref Range   Glucose-Capillary 285 (H) 70 - 99 mg/dL    Comment: Glucose reference range applies only to samples taken after fasting for at least 8 hours.  Glucose, capillary     Status: Abnormal   Collection Time: 10/23/21  8:12 PM  Result Value Ref Range   Glucose-Capillary 113 (H) 70 - 99 mg/dL    Comment: Glucose reference range applies only to samples taken after fasting for at least 8 hours.  Glucose, capillary     Status: Abnormal   Collection Time: 10/24/21  7:50 AM  Result Value Ref Range   Glucose-Capillary 149 (H) 70 - 99 mg/dL    Comment: Glucose reference range applies only to samples taken after fasting for at least 8 hours.  Glucose, capillary     Status: Abnormal   Collection Time: 10/24/21 11:45 AM  Result Value Ref Range   Glucose-Capillary 186 (H) 70 - 99 mg/dL    Comment: Glucose reference  range applies only to samples taken after fasting for at least 8 hours.  Glucose, capillary     Status: Abnormal   Collection Time: 10/24/21  4:04 PM  Result Value Ref Range   Glucose-Capillary 150 (H) 70 - 99 mg/dL    Comment: Glucose reference range applies only to samples taken after fasting for at least 8 hours.  Glucose, capillary     Status: Abnormal   Collection Time: 10/24/21  8:29 PM  Result Value Ref Range   Glucose-Capillary 153 (H) 70 - 99 mg/dL    Comment: Glucose reference range applies only to samples taken after fasting for at least 8 hours.  Glucose, capillary     Status: Abnormal   Collection Time: 10/25/21  7:14 AM  Result Value Ref Range   Glucose-Capillary 165 (H) 70 - 99 mg/dL    Comment: Glucose reference range applies only to samples taken after fasting for at least 8  hours.  Creatinine, serum     Status: Abnormal   Collection Time: 10/25/21 10:12 AM  Result Value Ref Range   Creatinine, Ser 2.61 (H) 0.44 - 1.00 mg/dL   GFR, Estimated 20 (L) >60 mL/min    Comment: (NOTE) Calculated using the CKD-EPI Creatinine Equation (2021) Performed at [facility], [address] [place], [place] [address]   Glucose, capillary     Status: Abnormal   Collection Time: 10/25/21 11:25 AM  Result Value Ref Range   Glucose-Capillary 187 (H) 70 - 99 mg/dL    Comment: Glucose reference range applies only to samples taken after fasting for at least 8 hours.  Glucose, capillary     Status: Abnormal   Collection Time: 10/25/21  3:52 PM  Result Value Ref Range   Glucose-Capillary 231 (H) 70 - 99 mg/dL    Comment: Glucose reference range applies only to samples taken after fasting for at least 8 hours.   Comment 1 Notify RN   Glucose, capillary     Status: Abnormal   Collection Time: 10/25/21 10:08 PM  Result Value Ref Range   Glucose-Capillary 263 (H) 70 - 99 mg/dL    Comment: Glucose reference range applies only to samples taken after fasting for at least 8 hours.   Comment 1 Notify RN   Glucose, capillary     Status: Abnormal   Collection Time: 10/26/21  9:01 AM  Result Value Ref Range   Glucose-Capillary 176 (H) 70 - 99 mg/dL    Comment: Glucose reference range applies only to samples taken after fasting for at least 8 hours.  Glucose, capillary     Status: Abnormal   Collection Time: 10/26/21 11:50 AM  Result Value Ref Range   Glucose-Capillary 156 (H) 70 - 99 mg/dL    Comment: Glucose reference range applies only to samples taken after fasting for at least 8 hours.  Glucose, capillary     Status: Abnormal   Collection Time: 10/26/21  4:27 PM  Result Value Ref Range   Glucose-Capillary 239 (H) 70 - 99 mg/dL    Comment: Glucose reference range applies only to samples taken after fasting for at least 8 hours.  Glucose, capillary     Status: Abnormal    Collection Time: 10/26/21  8:13 PM  Result Value Ref Range   Glucose-Capillary 182 (H) 70 - 99 mg/dL    Comment: Glucose reference range applies only to samples taken after fasting for at least 8 hours.   Comment 1 Notify RN   Glucose, capillary  Status: Abnormal   Collection Time: 10/27/21  8:27 AM  Result Value Ref Range   Glucose-Capillary 188 (H) 70 - 99 mg/dL    Comment: Glucose reference range applies only to samples taken after fasting for at least 8 hours.  Glucose, capillary     Status: Abnormal   Collection Time: 10/27/21 11:45 AM  Result Value Ref Range   Glucose-Capillary 163 (H) 70 - 99 mg/dL    Comment: Glucose reference range applies only to samples taken after fasting for at least 8 hours.  Glucose, capillary     Status: None   Collection Time: 10/27/21  5:00 PM  Result Value Ref Range   Glucose-Capillary 81 70 - 99 mg/dL    Comment: Glucose reference range applies only to samples taken after fasting for at least 8 hours.  Glucose, capillary     Status: Abnormal   Collection Time: 10/27/21  8:58 PM  Result Value Ref Range   Glucose-Capillary 100 (H) 70 - 99 mg/dL    Comment: Glucose reference range applies only to samples taken after fasting for at least 8 hours.  Glucose, capillary     Status: Abnormal   Collection Time: 10/28/21  8:04 AM  Result Value Ref Range   Glucose-Capillary 154 (H) 70 - 99 mg/dL    Comment: Glucose reference range applies only to samples taken after fasting for at least 8 hours.  Glucose, capillary     Status: Abnormal   Collection Time: 10/28/21 11:51 AM  Result Value Ref Range   Glucose-Capillary 177 (H) 70 - 99 mg/dL    Comment: Glucose reference range applies only to samples taken after fasting for at least 8 hours.  Glucose, capillary     Status: Abnormal   Collection Time: 10/28/21  4:13 PM  Result Value Ref Range   Glucose-Capillary 182 (H) 70 - 99 mg/dL    Comment: Glucose reference range applies only to samples taken  after fasting for at least 8 hours.  Glucose, capillary     Status: Abnormal   Collection Time: 10/28/21  9:26 PM  Result Value Ref Range   Glucose-Capillary 275 (H) 70 - 99 mg/dL    Comment: Glucose reference range applies only to samples taken after fasting for at least 8 hours.   Comment 1 Notify RN   Glucose, capillary     Status: Abnormal   Collection Time: 10/29/21  8:13 AM  Result Value Ref Range   Glucose-Capillary 167 (H) 70 - 99 mg/dL    Comment: Glucose reference range applies only to samples taken after fasting for at least 8 hours.  Glucose, capillary     Status: Abnormal   Collection Time: 10/29/21 11:45 AM  Result Value Ref Range   Glucose-Capillary 187 (H) 70 - 99 mg/dL    Comment: Glucose reference range applies only to samples taken after fasting for at least 8 hours.  Glucose, capillary     Status: Abnormal   Collection Time: 10/29/21  4:38 PM  Result Value Ref Range   Glucose-Capillary 153 (H) 70 - 99 mg/dL    Comment: Glucose reference range applies only to samples taken after fasting for at least 8 hours.  Glucose, capillary     Status: Abnormal   Collection Time: 10/29/21  9:51 PM  Result Value Ref Range   Glucose-Capillary 172 (H) 70 - 99 mg/dL    Comment: Glucose reference range applies only to samples taken after fasting for at least 8 hours.  Glucose, capillary  Status: Abnormal   Collection Time: 10/30/21  7:53 AM  Result Value Ref Range   Glucose-Capillary 143 (H) 70 - 99 mg/dL    Comment: Glucose reference range applies only to samples taken after fasting for at least 8 hours.  Glucose, capillary     Status: Abnormal   Collection Time: 10/30/21 11:40 AM  Result Value Ref Range   Glucose-Capillary 139 (H) 70 - 99 mg/dL    Comment: Glucose reference range applies only to samples taken after fasting for at least 8 hours.  Glucose, capillary     Status: Abnormal   Collection Time: 10/30/21  4:32 PM  Result Value Ref Range   Glucose-Capillary 111  (H) 70 - 99 mg/dL    Comment: Glucose reference range applies only to samples taken after fasting for at least 8 hours.  Glucose, capillary     Status: Abnormal   Collection Time: 10/30/21  9:12 PM  Result Value Ref Range   Glucose-Capillary 109 (H) 70 - 99 mg/dL    Comment: Glucose reference range applies only to samples taken after fasting for at least 8 hours.   Comment 1 Notify RN   Glucose, capillary     Status: Abnormal   Collection Time: 10/31/21  8:14 AM  Result Value Ref Range   Glucose-Capillary 128 (H) 70 - 99 mg/dL    Comment: Glucose reference range applies only to samples taken after fasting for at least 8 hours.  Glucose, capillary     Status: Abnormal   Collection Time: 10/31/21 11:48 AM  Result Value Ref Range   Glucose-Capillary 253 (H) 70 - 99 mg/dL    Comment: Glucose reference range applies only to samples taken after fasting for at least 8 hours.  Glucose, capillary     Status: Abnormal   Collection Time: 10/31/21  4:38 PM  Result Value Ref Range   Glucose-Capillary 158 (H) 70 - 99 mg/dL    Comment: Glucose reference range applies only to samples taken after fasting for at least 8 hours.  Creatinine, serum     Status: Abnormal   Collection Time: 11/01/21  7:13 AM  Result Value Ref Range   Creatinine, Ser 1.91 (H) 0.44 - 1.00 mg/dL   GFR, Estimated 29 (L) >60 mL/min    Comment: (NOTE) Calculated using the CKD-EPI Creatinine Equation (2021) Performed at [facility], [address] [place], [place] [address]   Glucose, capillary     Status: Abnormal   Collection Time: 11/01/21  7:38 AM  Result Value Ref Range   Glucose-Capillary 114 (H) 70 - 99 mg/dL    Comment: Glucose reference range applies only to samples taken after fasting for at least 8 hours.  Glucose, capillary     Status: Abnormal   Collection Time: 11/01/21 11:57 AM  Result Value Ref Range   Glucose-Capillary 232 (H) 70 - 99 mg/dL    Comment: Glucose reference range applies only to  samples taken after fasting for at least 8 hours.  Glucose, capillary     Status: Abnormal   Collection Time: 11/01/21  4:35 PM  Result Value Ref Range   Glucose-Capillary 157 (H) 70 - 99 mg/dL    Comment: Glucose reference range applies only to samples taken after fasting for at least 8 hours.  Glucose, capillary     Status: Abnormal   Collection Time: 11/02/21  8:03 AM  Result Value Ref Range   Glucose-Capillary 126 (H) 70 - 99 mg/dL    Comment: Glucose  reference range applies only to samples taken after fasting for at least 8 hours.  Glucose, capillary     Status: Abnormal   Collection Time: 11/02/21 11:44 AM  Result Value Ref Range   Glucose-Capillary 116 (H) 70 - 99 mg/dL    Comment: Glucose reference range applies only to samples taken after fasting for at least 8 hours.  Glucose, capillary     Status: None   Collection Time: 11/02/21  4:30 PM  Result Value Ref Range   Glucose-Capillary 98 70 - 99 mg/dL    Comment: Glucose reference range applies only to samples taken after fasting for at least 8 hours.  Glucose, capillary     Status: Abnormal   Collection Time: 11/03/21  8:07 AM  Result Value Ref Range   Glucose-Capillary 142 (H) 70 - 99 mg/dL    Comment: Glucose reference range applies only to samples taken after fasting for at least 8 hours.  Glucose, capillary     Status: Abnormal   Collection Time: 11/03/21 11:58 AM  Result Value Ref Range   Glucose-Capillary 125 (H) 70 - 99 mg/dL    Comment: Glucose reference range applies only to samples taken after fasting for at least 8 hours.  Glucose, capillary     Status: None   Collection Time: 11/03/21  1:47 PM  Result Value Ref Range   Glucose-Capillary 77 70 - 99 mg/dL    Comment: Glucose reference range applies only to samples taken after fasting for at least 8 hours.  Glucose, capillary     Status: Abnormal   Collection Time: 11/03/21  2:38 PM  Result Value Ref Range   Glucose-Capillary 132 (H) 70 - 99 mg/dL    Comment:  Glucose reference range applies only to samples taken after fasting for at least 8 hours.  Glucose, capillary     Status: Abnormal   Collection Time: 11/03/21  4:27 PM  Result Value Ref Range   Glucose-Capillary 150 (H) 70 - 99 mg/dL    Comment: Glucose reference range applies only to samples taken after fasting for at least 8 hours.  Glucose, capillary     Status: Abnormal   Collection Time: 11/04/21  7:47 AM  Result Value Ref Range   Glucose-Capillary 124 (H) 70 - 99 mg/dL    Comment: Glucose reference range applies only to samples taken after fasting for at least 8 hours.  Glucose, capillary     Status: Abnormal   Collection Time: 11/04/21 11:52 AM  Result Value Ref Range   Glucose-Capillary 220 (H) 70 - 99 mg/dL    Comment: Glucose reference range applies only to samples taken after fasting for at least 8 hours.  Glucose, capillary     Status: Abnormal   Collection Time: 11/04/21  4:21 PM  Result Value Ref Range   Glucose-Capillary 160 (H) 70 - 99 mg/dL    Comment: Glucose reference range applies only to samples taken after fasting for at least 8 hours.  Glucose, capillary     Status: Abnormal   Collection Time: 11/05/21  7:42 AM  Result Value Ref Range   Glucose-Capillary 137 (H) 70 - 99 mg/dL    Comment: Glucose reference range applies only to samples taken after fasting for at least 8 hours.  Glucose, capillary     Status: Abnormal   Collection Time: 11/05/21 11:35 AM  Result Value Ref Range   Glucose-Capillary 185 (H) 70 - 99 mg/dL    Comment: Glucose reference range applies only to  samples taken after fasting for at least 8 hours.  Glucose, capillary     Status: Abnormal   Collection Time: 11/05/21  4:30 PM  Result Value Ref Range   Glucose-Capillary 123 (H) 70 - 99 mg/dL    Comment: Glucose reference range applies only to samples taken after fasting for at least 8 hours.  Glucose, capillary     Status: Abnormal   Collection Time: 11/06/21  7:38 AM  Result Value Ref  Range   Glucose-Capillary 141 (H) 70 - 99 mg/dL    Comment: Glucose reference range applies only to samples taken after fasting for at least 8 hours.  Glucose, capillary     Status: Abnormal   Collection Time: 11/06/21 11:53 AM  Result Value Ref Range   Glucose-Capillary 112 (H) 70 - 99 mg/dL    Comment: Glucose reference range applies only to samples taken after fasting for at least 8 hours.  Glucose, capillary     Status: Abnormal   Collection Time: 11/06/21  4:31 PM  Result Value Ref Range   Glucose-Capillary 128 (H) 70 - 99 mg/dL    Comment: Glucose reference range applies only to samples taken after fasting for at least 8 hours.  Glucose, capillary     Status: Abnormal   Collection Time: 11/07/21  8:04 AM  Result Value Ref Range   Glucose-Capillary 165 (H) 70 - 99 mg/dL    Comment: Glucose reference range applies only to samples taken after fasting for at least 8 hours.  Glucose, capillary     Status: Abnormal   Collection Time: 11/07/21 11:50 AM  Result Value Ref Range   Glucose-Capillary 107 (H) 70 - 99 mg/dL    Comment: Glucose reference range applies only to samples taken after fasting for at least 8 hours.  Glucose, capillary     Status: Abnormal   Collection Time: 11/07/21  3:53 PM  Result Value Ref Range   Glucose-Capillary 137 (H) 70 - 99 mg/dL    Comment: Glucose reference range applies only to samples taken after fasting for at least 8 hours.  Creatinine, serum     Status: Abnormal   Collection Time: 11/08/21  6:50 AM  Result Value Ref Range   Creatinine, Ser 2.14 (H) 0.44 - 1.00 mg/dL   GFR, Estimated 26 (L) >60 mL/min    Comment: (NOTE) Calculated using the CKD-EPI Creatinine Equation (2021) Performed at [facility], [address] [place], [place] [address]   Glucose, capillary     Status: Abnormal   Collection Time: 11/08/21  7:45 AM  Result Value Ref Range   Glucose-Capillary 117 (H) 70 - 99 mg/dL    Comment: Glucose reference range applies  only to samples taken after fasting for at least 8 hours.  Glucose, capillary     Status: None   Collection Time: 11/08/21 11:40 AM  Result Value Ref Range   Glucose-Capillary 92 70 - 99 mg/dL    Comment: Glucose reference range applies only to samples taken after fasting for at least 8 hours.  Glucose, capillary     Status: Abnormal   Collection Time: 11/08/21  4:23 PM  Result Value Ref Range   Glucose-Capillary 183 (H) 70 - 99 mg/dL    Comment: Glucose reference range applies only to samples taken after fasting for at least 8 hours.  SARS CORONAVIRUS 2 (TAT 6-24 HRS) Nasopharyngeal Nasopharyngeal Swab     Status: None   Collection Time: 11/08/21  8:00 PM   Specimen: Nasopharyngeal  Swab  Result Value Ref Range   SARS Coronavirus 2 NEGATIVE NEGATIVE    Comment: (NOTE) SARS-CoV-2 target nucleic acids are NOT DETECTED.  The SARS-CoV-2 RNA is generally detectable in upper and lower respiratory specimens during the acute phase of infection. Negative results do not preclude SARS-CoV-2 infection, do not rule out co-infections with other pathogens, and should not be used as the sole basis for treatment or other patient management decisions. Negative results must be combined with clinical observations, patient history, and epidemiological information. The expected result is Negative.  Fact Sheet for Patients: SugarRoll.be  Fact Sheet for Healthcare Providers: https://www.woods-mathews.com/  This test is not yet approved or cleared by the [place] FDA and  has been authorized for detection and/or diagnosis of SARS-CoV-2 by FDA under an Emergency Use Authorization (EUA). This EUA will remain  in effect (meaning this test can be used) for the duration of the COVID-19 declaration under Se ction 564(b)(1) of the Act, 21 U.S.C. section 360bbb-3(b)(1), unless the authorization is terminated or revoked sooner.  Performed at [facility], [place] [address], [place], [place] [address]   Glucose, capillary     Status: Abnormal   Collection Time: 11/09/21  7:46 AM  Result Value Ref Range   Glucose-Capillary 119 (H) 70 - 99 mg/dL    Comment: Glucose reference range applies only to samples taken after fasting for at least 8 hours.  Glucose, capillary     Status: Abnormal   Collection Time: 11/09/21 11:45 AM  Result Value Ref Range   Glucose-Capillary 123 (H) 70 - 99 mg/dL    Comment: Glucose reference range applies only to samples taken after fasting for at least 8 hours.  Glucose, capillary     Status: Abnormal   Collection Time: 11/09/21  4:30 PM  Result Value Ref Range   Glucose-Capillary 121 (H) 70 - 99 mg/dL    Comment: Glucose reference range applies only to samples taken after fasting for at least 8 hours.  Glucose, capillary     Status: Abnormal   Collection Time: 11/10/21  7:35 AM  Result Value Ref Range   Glucose-Capillary 136 (H) 70 - 99 mg/dL    Comment: Glucose reference range applies only to samples taken after fasting for at least 8 hours.  Glucose, capillary     Status: Abnormal   Collection Time: 11/10/21  8:12 AM  Result Value Ref Range   Glucose-Capillary 141 (H) 70 - 99 mg/dL    Comment: Glucose reference range applies only to samples taken after fasting for at least 8 hours.  Glucose, capillary     Status: None   Collection Time: 11/10/21 11:47 AM  Result Value Ref Range   Glucose-Capillary 71 70 - 99 mg/dL    Comment: Glucose reference range applies only to samples taken after fasting for at least 8 hours.    Assessment/Plan:  CKD (chronic kidney disease), stage IV (HCC) Can worsen swelling  Essential hypertension blood pressure control important in reducing the progression of atherosclerotic disease. On appropriate oral medications.   Type 2 diabetes mellitus with diabetic chronic kidney disease (HCC) blood glucose control important in reducing the progression of atherosclerotic disease.  Also, involved in wound healing. On appropriate medications.   Hand swelling The patient has isolated swelling in her left hand where she has a severe contracture.  Her arm is really not that swollen so I do not think a lymphedema pump is going to be of much benefit.  Elevation  would certainly help.  She also has fluid retention in her legs with her chronic kidney disease and compression socks there would help as well.  She should elevate her legs and her left arm as tolerated.  I have referred her to Dr. Peggye Ley at [facility] who is a hand specialist to see if there is any option for release of the tendons to try to get her fingers not contracted.  I think that would help her swelling as well potentially.  I do not really have that much to offer other than elevation.  She will return as needed      Leotis Pain 01/12/2022, 10:08 AM   This note was created with Dragon medical transcription system.  Any errors from dictation are unintentional.

## 2022-01-12 NOTE — Assessment & Plan Note (Signed)
Can worsen swelling

## 2022-01-12 NOTE — Assessment & Plan Note (Signed)
blood glucose control important in reducing the progression of atherosclerotic disease. Also, involved in wound healing. On appropriate medications.  

## 2022-01-12 NOTE — Assessment & Plan Note (Signed)
>>  ASSESSMENT AND PLAN FOR CKD (CHRONIC KIDNEY DISEASE), STAGE IV ([place]) WRITTEN ON 01/12/2022 10:04 AM BY DEW, Erskine Squibb, MD  Can worsen swelling

## 2022-01-12 NOTE — Assessment & Plan Note (Signed)
blood pressure control important in reducing the progression of atherosclerotic disease. On appropriate oral medications.  

## 2022-01-18 ENCOUNTER — Ambulatory Visit: Payer: Medicare Other | Admitting: Podiatry

## 2022-01-22 ENCOUNTER — Ambulatory Visit (INDEPENDENT_AMBULATORY_CARE_PROVIDER_SITE_OTHER): Payer: Medicare Other | Admitting: Podiatry

## 2022-01-22 DIAGNOSIS — M79675 Pain in left toe(s): Secondary | ICD-10-CM | POA: Diagnosis not present

## 2022-01-22 DIAGNOSIS — M79674 Pain in right toe(s): Secondary | ICD-10-CM | POA: Diagnosis not present

## 2022-01-22 DIAGNOSIS — B351 Tinea unguium: Secondary | ICD-10-CM

## 2022-01-30 ENCOUNTER — Other Ambulatory Visit: Payer: Self-pay

## 2022-01-30 ENCOUNTER — Emergency Department
Admission: EM | Admit: 2022-01-30 | Discharge: 2022-01-30 | Disposition: A | Discharge status: Skilled Nursing Facility | Payer: Medicare Other | Attending: Emergency Medicine | Admitting: Emergency Medicine

## 2022-01-30 DIAGNOSIS — K1379 Other lesions of oral mucosa: Secondary | ICD-10-CM | POA: Diagnosis present

## 2022-01-30 NOTE — ED Notes (Signed)
Report given to [facility]. Pt's sister came and transport pt back to group home.

## 2022-01-30 NOTE — ED Notes (Signed)
Pt's sister called and states she can come pick pt up and take pt to group home.

## 2022-01-30 NOTE — ED Notes (Signed)
Pt presents to ED with c/o bleeding from mouth. Pt states that today her mouth started bleeding while at group home. No bleeding noted. Pt does have a small wound on tongue. Pt denies pain at this time.

## 2022-01-30 NOTE — ED Triage Notes (Signed)
BIB [facility] from group home. Pt called for some bleeding from her mouth. no bleeding noted currently. Pt does have small wound on tongue.  ([place])  Vitals:  63 HR 100 o2 148/63 98 BGL

## 2022-01-30 NOTE — ED Notes (Signed)
Pt had a concern for bleeding in her mouth. This RN inspected inside of pt's mouth. No active bleeding noted.

## 2022-01-30 NOTE — ED Provider Notes (Signed)
   [facility] Provider Note    Event Date/Time   First MD Initiated Contact with Patient 01/30/22 1918     (approximate)   History   Oral Swelling   HPI  Alicia Rogers is a 61 y.o. female  who presents to the emergency department today because of concerns for oral bleeding.  The patient states that she was eating dinner night when she started noticing blood coming from her mouth.  She denies biting her tongue or her cheek prior to the bleeding starting.  Denies any pain.  No current bleeding.  Denies similar symptoms in the past.    Physical Exam   Triage Vital Signs: ED Triage Vitals [01/30/22 1848]  Enc Vitals Group     BP (!) 145/56     Pulse Rate (!) 58     Resp 16     Temp (!) 97.4 F (36.3 C)     Temp Source Oral     SpO2 100 %     Weight 119 lb 14.9 oz (54.4 kg)     Height '5\' 2"'$  (1.575 m)     Head Circumference      Peak Flow      Pain Score 0   Most recent vital signs: Vitals:   01/30/22 1848  BP: (!) 145/56  Pulse: (!) 58  Resp: 16  Temp: (!) 97.4 F (36.3 C)  SpO2: 100%   General: Awake, alert, oriented. CV:  Good peripheral perfusion.  Resp:  Normal effort.  Abd:  No distention.  Other:  No signs of active bleeding, small area to tongue that appears to be small ulceration.   ED Results / Procedures / Treatments   Labs (all labs ordered are listed, but only abnormal results are displayed) Labs Reviewed - No data to display   EKG  None   RADIOLOGY None   PROCEDURES:  Critical Care performed: No  Procedures   MEDICATIONS ORDERED IN ED: Medications - No data to display   IMPRESSION / MDM / [place] / ED COURSE  I reviewed the triage vital signs and the nursing notes.                              Differential diagnosis includes, but is not limited to, dental disease, nosebleed, intraoral laceration.  Patient's presentation is most consistent with acute presentation with potential  threat to life or bodily function.  Patient presented to the emergency department today because of concerns for bleeding coming from her mouth.  On exam patient has no bleeding but does appear to have a small ulcer on her tongue.  I do wonder if this was a source of the bleeding.  Patient was observed in the emergency department for a few hours without any further bleeding.  This time I think is reasonable for patient to be discharged home. FINAL CLINICAL IMPRESSION(S) / ED DIAGNOSES   Final diagnoses:  Oral bleeding     Note:  This document was prepared using Dragon voice recognition software and may include unintentional dictation errors.    Nance Pear, MD 01/30/22 2139

## 2022-01-30 NOTE — Discharge Instructions (Signed)
Please seek medical attention for any high fevers, chest pain, shortness of breath, change in behavior, persistent vomiting, bloody stool or any other new or concerning symptoms.  

## 2022-01-30 NOTE — ED Notes (Addendum)
Per pt request, RN attempted to call pt's sister to see if they could take pt back to group home. RN attempted to leave a voicemail but mailbox is full.

## 2022-02-05 ENCOUNTER — Inpatient Hospital Stay: Payer: Medicare Other

## 2022-02-05 ENCOUNTER — Inpatient Hospital Stay: Payer: Medicare Other | Attending: Internal Medicine | Admitting: Internal Medicine

## 2022-02-05 ENCOUNTER — Other Ambulatory Visit: Payer: Self-pay | Admitting: Internal Medicine

## 2022-02-05 ENCOUNTER — Ambulatory Visit
Admission: RE | Admit: 2022-02-05 | Discharge: 2022-02-05 | Disposition: A | Discharge status: Home / Self Care | Payer: Medicare Other | Attending: Internal Medicine | Admitting: Internal Medicine

## 2022-02-05 ENCOUNTER — Encounter: Payer: Self-pay | Admitting: Internal Medicine

## 2022-02-05 ENCOUNTER — Ambulatory Visit
Admission: RE | Admit: 2022-02-05 | Discharge: 2022-02-05 | Disposition: A | Discharge status: Home / Self Care | Payer: Medicare Other | Source: Ambulatory Visit | Attending: Internal Medicine | Admitting: Internal Medicine

## 2022-02-05 VITALS — BP 141/52 | HR 61 | Temp 95.9°F | Ht 62.0 in | Wt 117.0 lb

## 2022-02-05 DIAGNOSIS — D649 Anemia, unspecified: Secondary | ICD-10-CM

## 2022-02-05 DIAGNOSIS — Z794 Long term (current) use of insulin: Secondary | ICD-10-CM

## 2022-02-05 DIAGNOSIS — F259 Schizoaffective disorder, unspecified: Secondary | ICD-10-CM | POA: Diagnosis not present

## 2022-02-05 DIAGNOSIS — R233 Spontaneous ecchymoses: Secondary | ICD-10-CM

## 2022-02-05 DIAGNOSIS — M7989 Other specified soft tissue disorders: Secondary | ICD-10-CM | POA: Diagnosis not present

## 2022-02-05 DIAGNOSIS — D631 Anemia in chronic kidney disease: Secondary | ICD-10-CM | POA: Diagnosis not present

## 2022-02-05 DIAGNOSIS — D472 Monoclonal gammopathy: Secondary | ICD-10-CM | POA: Insufficient documentation

## 2022-02-05 DIAGNOSIS — N184 Chronic kidney disease, stage 4 (severe): Secondary | ICD-10-CM

## 2022-02-05 DIAGNOSIS — E119 Type 2 diabetes mellitus without complications: Secondary | ICD-10-CM | POA: Diagnosis not present

## 2022-02-05 DIAGNOSIS — Z79899 Other long term (current) drug therapy: Secondary | ICD-10-CM | POA: Insufficient documentation

## 2022-02-05 DIAGNOSIS — F17210 Nicotine dependence, cigarettes, uncomplicated: Secondary | ICD-10-CM

## 2022-02-05 DIAGNOSIS — Z8042 Family history of malignant neoplasm of prostate: Secondary | ICD-10-CM | POA: Diagnosis not present

## 2022-02-05 DIAGNOSIS — R5383 Other fatigue: Secondary | ICD-10-CM | POA: Diagnosis not present

## 2022-02-05 LAB — IRON AND TIBC
Iron: 34 ug/dL (ref 28–170)
Saturation Ratios: 12 % (ref 10.4–31.8)
TIBC: 295 ug/dL (ref 250–450)
UIBC: 261 ug/dL

## 2022-02-05 LAB — CBC WITH DIFFERENTIAL/PLATELET
Abs Immature Granulocytes: 0.13 K/uL — ABNORMAL HIGH (ref 0.00–0.07)
Basophils Absolute: 0 K/uL (ref 0.0–0.1)
Basophils Relative: 0 %
Eosinophils Absolute: 0.1 K/uL (ref 0.0–0.5)
Eosinophils Relative: 1 %
HCT: 26.7 % — ABNORMAL LOW (ref 36.0–46.0)
Hemoglobin: 8.1 g/dL — ABNORMAL LOW (ref 12.0–15.0)
Immature Granulocytes: 1 %
Lymphocytes Relative: 19 %
Lymphs Abs: 2.1 K/uL (ref 0.7–4.0)
MCH: 27.5 pg (ref 26.0–34.0)
MCHC: 30.3 g/dL (ref 30.0–36.0)
MCV: 90.5 fL (ref 80.0–100.0)
Monocytes Absolute: 0.9 K/uL (ref 0.1–1.0)
Monocytes Relative: 9 %
Neutro Abs: 7.4 K/uL (ref 1.7–7.7)
Neutrophils Relative %: 70 %
Platelets: 410 K/uL — ABNORMAL HIGH (ref 150–400)
RBC: 2.95 MIL/uL — ABNORMAL LOW (ref 3.87–5.11)
RDW: 14.8 % (ref 11.5–15.5)
WBC: 10.7 K/uL — ABNORMAL HIGH (ref 4.0–10.5)
nRBC: 0 % (ref 0.0–0.2)

## 2022-02-05 LAB — RETICULOCYTES
Immature Retic Fract: 27.6 % — ABNORMAL HIGH (ref 2.3–15.9)
RBC.: 2.9 MIL/uL — ABNORMAL LOW (ref 3.87–5.11)
Retic Count, Absolute: 65.8 K/uL (ref 19.0–186.0)
Retic Ct Pct: 2.3 % (ref 0.4–3.1)

## 2022-02-05 LAB — COMPREHENSIVE METABOLIC PANEL WITH GFR
ALT: 20 U/L (ref 0–44)
AST: 18 U/L (ref 15–41)
Albumin: 3.1 g/dL — ABNORMAL LOW (ref 3.5–5.0)
Alkaline Phosphatase: 74 U/L (ref 38–126)
Anion gap: 9 (ref 5–15)
BUN: 62 mg/dL — ABNORMAL HIGH (ref 8–23)
CO2: 21 mmol/L — ABNORMAL LOW (ref 22–32)
Calcium: 9.5 mg/dL (ref 8.9–10.3)
Chloride: 108 mmol/L (ref 98–111)
Creatinine, Ser: 3.2 mg/dL — ABNORMAL HIGH (ref 0.44–1.00)
GFR, Estimated: 16 mL/min — ABNORMAL LOW
Glucose, Bld: 109 mg/dL — ABNORMAL HIGH (ref 70–99)
Potassium: 4.7 mmol/L (ref 3.5–5.1)
Sodium: 138 mmol/L (ref 135–145)
Total Bilirubin: 0.3 mg/dL (ref 0.3–1.2)
Total Protein: 8.2 g/dL — ABNORMAL HIGH (ref 6.5–8.1)

## 2022-02-05 LAB — PROTIME-INR
INR: 1.1 (ref 0.8–1.2)
Prothrombin Time: 13.9 seconds (ref 11.4–15.2)

## 2022-02-05 LAB — FOLATE: Folate: 31 ng/mL

## 2022-02-05 LAB — TECHNOLOGIST SMEAR REVIEW
Plt Morphology: NORMAL
RBC MORPHOLOGY: NORMAL
WBC MORPHOLOGY: NORMAL

## 2022-02-05 LAB — C-REACTIVE PROTEIN: CRP: 1.2 mg/dL — ABNORMAL HIGH

## 2022-02-05 LAB — FERRITIN: Ferritin: 24 ng/mL (ref 11–307)

## 2022-02-05 LAB — LACTATE DEHYDROGENASE: LDH: 136 U/L (ref 98–192)

## 2022-02-05 LAB — APTT: aPTT: 31 seconds (ref 24–36)

## 2022-02-05 LAB — VITAMIN B12: Vitamin B-12: 810 pg/mL (ref 180–914)

## 2022-02-05 NOTE — Progress Notes (Signed)
[name]  Patient Care Team: Alicia Bolk, FNP as PCP - General (Nurse Practitioner) Alicia Sickle, MD as Consulting Physician (Oncology)  CHIEF COMPLAINTS/PURPOSE OF CONSULTATION: Monoclonal gammopathy     A poorly-defined band of restricted protein mobility is detected       in the gamma globulins. It is unlikely that this may represent a       monoclonal protein; however, immunofixation analysis is available       if clinically indicated.  150.7 High      Free Lambda Lt Chains, S 64.0 High     Free Kappa/Lambda Ratio 2.35 High      Component    Free Kappa Lt Chains, S  Free Lambda Lt Chains, S  Free Kappa/Lambda Ratio    # CKD stage-IV Dr.Korrapati]; schizoaffective disorder [Psyche;?] ; DM- on insulin.   HISTORY OF PRESENTING ILLNESS: Alone. Ambulating independently. Poor historian.   Alicia Rogers 61 y.o.  female with schizoaffective disorder with CKD; anemia- has been referred to [place] by nephrology further evaluation/work-up for monoclonal gammopathy.  Chronic joint pains however no new onset of bone pain.  Chronic mild fatigue.    Denies any nausea vomiting.  Complains of chronic leg swelling.  Not any worse.  Review of Systems  Constitutional:  Negative for chills, diaphoresis, fever, malaise/fatigue and weight loss.  HENT:  Negative for nosebleeds and sore throat.   Eyes:  Negative for double vision.  Respiratory:  Negative for cough, hemoptysis, sputum production, shortness of breath and wheezing.   Cardiovascular:  Positive for leg swelling. Negative for chest pain, palpitations and orthopnea.  Gastrointestinal:  Negative for abdominal pain, blood in stool, constipation, diarrhea, heartburn, melena, nausea and vomiting.  Genitourinary:  Negative for dysuria, frequency and urgency.  Musculoskeletal:  Positive for back pain and joint pain.  Skin: Negative.  Negative for itching and rash.  Neurological:  Negative for  dizziness, tingling, focal weakness, weakness and headaches.  Endo/Heme/Allergies:  Does not bruise/bleed easily.  Psychiatric/Behavioral:  Negative for depression. The patient is not nervous/anxious and does not have insomnia.     MEDICAL HISTORY:  Past Medical History:  Diagnosis Date   Depression    Diabetes mellitus without complication ([place])    Diabetes mellitus, type II ([place])    Hypertension    Schizophrenia ([place])     SURGICAL HISTORY: Past Surgical History:  Procedure Laterality Date   colonscopy      SOCIAL HISTORY: Social History   Socioeconomic History   Marital status: Single    Spouse name: Not on file   Number of children: Not on file   Years of education: Not on file   Highest education level: Not on file  Occupational History   Not on file  Tobacco Use   Smoking status: Every Day    Packs/day: 1.00    Types: Cigarettes    Start date: 04/21/1978   Smokeless tobacco: Never  Vaping Use   Vaping Use: Former  Substance and Sexual Activity   Alcohol use: No    Alcohol/week: 0.0 standard drinks of alcohol   Drug use: No   Sexual activity: Not Currently  Other Topics Concern   Not on file  Social History Narrative   Not on file   Social Determinants of Health   Financial Resource Strain: Not on file  Food Insecurity: Not on file  Transportation Needs: Not on file  Physical Activity: Not on file  Stress: Not on  file  Social Connections: Not on file  Intimate Partner Violence: Not on file    FAMILY HISTORY: Family History  Problem Relation Age of Onset   Diabetes Mother    Glaucoma Mother    Heart disease Mother    Heart attack Mother    Depression Mother    Hypertension Mother    Diabetes Father    Glaucoma Father    Hypertension Father    Prostate cancer Father    Diabetes Sister    Depression Sister    Post-traumatic stress disorder Sister     ALLERGIES:  is allergic to penicillins.  MEDICATIONS:  Current Outpatient Medications   Medication Sig Dispense Refill   acetaminophen (TYLENOL) 325 MG tablet Take by mouth.     aspirin EC 81 MG tablet Take 81 mg by mouth daily.     calcitRIOL (ROCALTROL) 0.25 MCG capsule Take 0.25 mcg by mouth daily.     cyclobenzaprine (FLEXERIL) 10 MG tablet Take 1 tablet (10 mg total) by mouth at bedtime. 30 tablet 0   dapagliflozin propanediol (FARXIGA) 5 MG TABS tablet Take 5 mg by mouth daily.     diltiazem (CARDIZEM CD) 360 MG 24 hr capsule Take 1 capsule (360 mg total) by mouth daily. 30 capsule 3   ergocalciferol (VITAMIN D2) 1.25 MG (50000 UT) capsule Take 50,000 Units by mouth once a week.     escitalopram (LEXAPRO) 10 MG tablet Take 1 tablet (10 mg total) by mouth daily. 30 tablet 3   ferrous sulfate 325 (65 FE) MG tablet Take by mouth.     furosemide (LASIX) 20 MG tablet Take 20 mg by mouth daily.     gabapentin (NEURONTIN) 100 MG capsule Take 100 mg by mouth 3 (three) times daily.     glycerin adult 2 g suppository Place rectally.     hydrALAZINE (APRESOLINE) 50 MG tablet Take 1 tablet (50 mg total) by mouth 3 (three) times daily.     INVEGA SUSTENNA injection SMARTSIG:117 Milligram(s) IM Once a Month     linagliptin (TRADJENTA) 5 MG TABS tablet Take 1 tablet (5 mg total) by mouth daily. 30 tablet 3   loperamide (IMODIUM) 2 MG capsule Take by mouth.     losartan (COZAAR) 100 MG tablet Take 100 mg by mouth daily.     melatonin 3 MG TABS tablet Take by mouth.     multivitamin (RENA-VIT) TABS tablet Take 1 tablet by mouth at bedtime. 30 tablet 3   oxybutynin (DITROPAN XL) 15 MG 24 hr tablet TAKE 1 TABLET BY MOUTH ONCE DAILY FOR BLADDER *WEAR GLOVES IF CRUSHING*     traZODone (DESYREL) 100 MG tablet Take 1 tablet (100 mg total) by mouth at bedtime. 30 tablet 3   alum & mag hydroxide-simeth (MAALOX/MYLANTA) 200-200-20 MG/5ML suspension Take by mouth. (Patient not taking: Reported on 02/05/2022)     insulin lispro (HUMALOG) 100 UNIT/ML injection Inject 0.04 mLs (4 Units total) into the  skin 3 (three) times daily with meals. (Patient not taking: Reported on 02/05/2022) 4 mL 0   Lurasidone HCl 60 MG TABS Take by mouth. (Patient not taking: Reported on 02/05/2022)     nystatin (MYCOSTATIN) 100000 UNIT/ML suspension Swish and swallow 500,000 Units 4 (four) times daily (Patient not taking: Reported on 02/05/2022)     OLANZapine (ZYPREXA) 10 MG tablet Take 1 tablet (10 mg total) by mouth at bedtime. (Patient not taking: Reported on 02/05/2022) 30 tablet 3   OLANZapine (ZYPREXA) 10 MG tablet Take  by mouth. (Patient not taking: Reported on 02/05/2022)     ondansetron (ZOFRAN) 4 MG tablet Take by mouth. (Patient not taking: Reported on 02/05/2022)     polyethylene glycol powder (GLYCOLAX/MIRALAX) 17 GM/SCOOP powder Take by mouth. (Patient not taking: Reported on 02/05/2022)     No current facility-administered medications for this visit.      PHYSICAL EXAMINATION:   Vitals:   02/05/22 1114  BP: (!) 141/52  Pulse: 61  Temp: (!) 95.9 F (35.5 C)  SpO2: 97%   Filed Weights   02/05/22 1114  Weight: 117 lb (53.1 kg)    Physical Exam Vitals and nursing note reviewed.  HENT:     Head: Normocephalic and atraumatic.     Mouth/Throat:     Pharynx: Oropharynx is clear.  Eyes:     Extraocular Movements: Extraocular movements intact.     Pupils: Pupils are equal, round, and reactive to light.  Cardiovascular:     Rate and Rhythm: Normal rate and regular rhythm.  Pulmonary:     Comments: Decreased breath sounds bilaterally.  Abdominal:     Palpations: Abdomen is soft.  Musculoskeletal:        General: Normal range of motion.     Cervical back: Normal range of motion.  Skin:    General: Skin is warm.  Neurological:     General: No focal deficit present.     Mental Status: She is alert and oriented to person, place, and time.  Psychiatric:        Behavior: Behavior normal.        Judgment: Judgment normal.     LABORATORY DATA:  I have reviewed the data as listed Lab Results   Component Value Date   WBC 10.7 (H) 02/05/2022   HGB 8.1 (L) 02/05/2022   HCT 26.7 (L) 02/05/2022   MCV 90.5 02/05/2022   PLT 410 (H) 02/05/2022   Recent Labs    10/19/21 1313 10/21/21 0605 10/25/21 1012 11/01/21 0713 11/08/21 0650 02/05/22 1151  NA 137 133*  --   --   --  138  K 4.2 3.6  --   --   --  4.7  CL 98 99  --   --   --  108  CO2 26 28  --   --   --  21*  GLUCOSE 192* 155*  --   --   --  109*  BUN 78* 55*  --   --   --  62*  CREATININE 2.59* 2.17*   < > 1.91* 2.14* 3.20*  CALCIUM 9.6 8.7*  --   --   --  9.5  GFRNONAA 20* 25*   < > 29* 26* 16*  PROT 7.9 6.9  --   --   --  8.2*  ALBUMIN 3.1* 2.6*  --   --   --  3.1*  AST 27 18  --   --   --  18  ALT 20 15  --   --   --  20  ALKPHOS 93 87  --   --   --  74  BILITOT 0.3 0.5  --   --   --  0.3   < > = values in this interval not displayed.     No results found.  No results found for: "KPAFRELGTCHN", "LAMBDASER", "KAPLAMBRATIO"   MGUS (monoclonal gammopathy of unknown significance) #Possible monoclonal protein as noted on SPEP-as part of nephrology evaluation for chronic kidney disease.  Kappa  lambda light chain ratio abnormal- ?  CKD versus gammopathy.  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  #Chronic kidney disease/ ? DM vs other--III -followed by Dr.Korrapati    # Recommend repeating SPEP with immunofixation iron studies ferritin LDH haptoglobin reticulocyte count.  Get a bone survey.  Hold bone marrow biopsy time while awaiting work-up.  Thank you Dr.Korrapati for allowing me to participate in the care of your pleasant patient. Please do not hesitate to contact me with questions or concerns in the interim.  # DISPOSITION:  # labs- today-ordered- PT/PTT;  # x-rays of bone- ordered # follow up 7/14- at 2:00; MD; no labs-Dr.B    All questions were answered. The patient knows to call the clinic with any problems, questions or  concerns.      Alicia Sickle, MD 02/05/2022 5:18 PM

## 2022-02-05 NOTE — Assessment & Plan Note (Addendum)
#  Possible monoclonal protein as noted on SPEP-as part of nephrology evaluation for chronic kidney disease.  Kappa lambda light chain ratio abnormal- ?  CKD versus gammopathy.  See below  #Anemia- 9-secondary to CKD/iron deficiency.-Again await below work-up to rule out any multiple myeloma.  Plan IV iron infusion significantly low hemoglobin/iron deficiency.  #Chronic kidney disease-IV / ? DM vs other--III -followed by Dr.Korrapati    # Recommend repeating SPEP with immunofixation iron studies ferritin LDH haptoglobin reticulocyte count.  Get a bone survey.  Hold bone marrow biopsy time while awaiting work-up.  Thank you Dr.Korrapati for allowing me to participate in the care of your pleasant patient. Please do not hesitate to contact me with questions or concerns in the interim.  # DISPOSITION:  # labs- today-ordered- PT/PTT;  # x-rays of bone- ordered # follow up 7/14- at 2:00; MD; no labs-Dr.B*possible IV iron infusion-Dr.B

## 2022-02-06 LAB — HAPTOGLOBIN: Haptoglobin: 401 mg/dL — ABNORMAL HIGH (ref 37–355)

## 2022-02-07 LAB — ERYTHROPOIETIN: Erythropoietin: 12.6 m[IU]/mL (ref 2.6–18.5)

## 2022-02-08 LAB — KAPPA/LAMBDA LIGHT CHAINS
Kappa free light chain: 163.8 mg/L — ABNORMAL HIGH (ref 3.3–19.4)
Kappa, lambda light chain ratio: 2.63 — ABNORMAL HIGH (ref 0.26–1.65)
Lambda free light chains: 62.2 mg/L — ABNORMAL HIGH (ref 5.7–26.3)

## 2022-02-09 LAB — MULTIPLE MYELOMA PANEL, SERUM
Albumin SerPl Elph-Mcnc: 3.2 g/dL (ref 2.9–4.4)
Albumin/Glob SerPl: 0.8 (ref 0.7–1.7)
Alpha 1: 0.3 g/dL (ref 0.0–0.4)
Alpha2 Glob SerPl Elph-Mcnc: 1.2 g/dL — ABNORMAL HIGH (ref 0.4–1.0)
B-Globulin SerPl Elph-Mcnc: 1.4 g/dL — ABNORMAL HIGH (ref 0.7–1.3)
Gamma Glob SerPl Elph-Mcnc: 1.4 g/dL (ref 0.4–1.8)
Globulin, Total: 4.3 g/dL — ABNORMAL HIGH (ref 2.2–3.9)
IgA: 517 mg/dL — ABNORMAL HIGH (ref 87–352)
IgG (Immunoglobin G), Serum: 1450 mg/dL (ref 586–1602)
IgM (Immunoglobulin M), Srm: 163 mg/dL (ref 26–217)
Total Protein ELP: 7.5 g/dL (ref 6.0–8.5)

## 2022-02-12 ENCOUNTER — Inpatient Hospital Stay (HOSPITAL_BASED_OUTPATIENT_CLINIC_OR_DEPARTMENT_OTHER): Payer: Medicare Other | Admitting: Internal Medicine

## 2022-02-12 ENCOUNTER — Inpatient Hospital Stay: Payer: Medicare Other

## 2022-02-12 ENCOUNTER — Ambulatory Visit: Payer: Medicare Other | Admitting: Internal Medicine

## 2022-02-12 ENCOUNTER — Encounter: Payer: Self-pay | Admitting: Internal Medicine

## 2022-02-12 VITALS — BP 146/61 | HR 63 | Temp 96.4°F | Resp 18 | Wt 118.6 lb

## 2022-02-12 VITALS — BP 160/44 | HR 59

## 2022-02-12 DIAGNOSIS — D649 Anemia, unspecified: Secondary | ICD-10-CM | POA: Diagnosis not present

## 2022-02-12 DIAGNOSIS — D472 Monoclonal gammopathy: Secondary | ICD-10-CM

## 2022-02-12 DIAGNOSIS — N1832 Chronic kidney disease, stage 3b: Secondary | ICD-10-CM

## 2022-02-12 MED ORDER — SODIUM CHLORIDE 0.9 % IV SOLN
200.0000 mg | Freq: Once | INTRAVENOUS | Status: AC
  Administered 2022-02-12: 200 mg via INTRAVENOUS
  Filled 2022-02-12: qty 200

## 2022-02-12 MED ORDER — SODIUM CHLORIDE 0.9 % IV SOLN
Freq: Once | INTRAVENOUS | Status: AC
  Filled 2022-02-12: qty 250

## 2022-02-12 MED ORDER — EPOETIN ALFA-EPBX 10000 UNIT/ML IJ SOLN: 20000.0000 [IU] | Freq: Once | INTRAMUSCULAR | Status: DC

## 2022-02-12 NOTE — Progress Notes (Unsigned)
[facility] CONSULT NOTE  Patient Care Team: Romualdo Bolk, FNP as PCP - General (Nurse Practitioner) Cammie Sickle, MD as Consulting Physician (Oncology)  CHIEF COMPLAINTS/PURPOSE OF CONSULTATION: Monoclonal gammopathy   #  JULY 2023-Anemia-8- 9-secondary to CKD/iron deficiency.[colo-2014] Plan IV iron infusion today- ferritin-12; iron sat 16%.   IV iron infusion. Discussed the potential acute infusion reactions with IV iron    A poorly-defined band of restricted protein mobility is detected       in the gamma globulins. It is unlikely that this may represent a       monoclonal protein; however, immunofixation analysis is available       if clinically indicated.  150.7 High      Free Lambda Lt Chains, S 64.0 High     Free Kappa/Lambda Ratio 2.35 High      Component    Free Kappa Lt Chains, S  Free Lambda Lt Chains, S  Free Kappa/Lambda Ratio    # CKD stage-IV Dr.Korrapati]; schizoaffective disorder [Psyche;?] ; DM- on insulin.   HISTORY OF PRESENTING ILLNESS: Alone. Ambulating independently. Poor historian.   Alicia Rogers 61 y.o.  female with schizoaffective disorder with CKD; anemia- has been referred to [place] by nephrology further evaluation/work-up for monoclonal gammopathy.  Chronic joint pains however no new onset of bone pain.  Chronic mild fatigue.    Denies any nausea vomiting.  Complains of chronic leg swelling.  Not any worse.  Review of Systems  Constitutional:  Negative for chills, diaphoresis, fever, malaise/fatigue and weight loss.  HENT:  Negative for nosebleeds and sore throat.   Eyes:  Negative for double vision.  Respiratory:  Negative for cough, hemoptysis, sputum production, shortness of breath and wheezing.   Cardiovascular:  Positive for leg swelling. Negative for chest pain, palpitations and orthopnea.  Gastrointestinal:  Negative for abdominal pain, blood in stool, constipation, diarrhea, heartburn, melena, nausea and  vomiting.  Genitourinary:  Negative for dysuria, frequency and urgency.  Musculoskeletal:  Positive for back pain and joint pain.  Skin: Negative.  Negative for itching and rash.  Neurological:  Negative for dizziness, tingling, focal weakness, weakness and headaches.  Endo/Heme/Allergies:  Does not bruise/bleed easily.  Psychiatric/Behavioral:  Negative for depression. The patient is not nervous/anxious and does not have insomnia.     MEDICAL HISTORY:  Past Medical History:  Diagnosis Date   Depression    Diabetes mellitus without complication ([place])    Diabetes mellitus, type II ([place])    Hypertension    Schizophrenia ([place])     SURGICAL HISTORY: Past Surgical History:  Procedure Laterality Date   colonscopy      SOCIAL HISTORY: Social History   Socioeconomic History   Marital status: Single    Spouse name: Not on file   Number of children: Not on file   Years of education: Not on file   Highest education level: Not on file  Occupational History   Not on file  Tobacco Use   Smoking status: Every Day    Packs/day: 1.00    Types: Cigarettes    Start date: 04/21/1978   Smokeless tobacco: Never  Vaping Use   Vaping Use: Former  Substance and Sexual Activity   Alcohol use: No    Alcohol/week: 0.0 standard drinks of alcohol   Drug use: No   Sexual activity: Not Currently  Other Topics Concern   Not on file  Social History Narrative   Not on file   Social Determinants  of Health   Financial Resource Strain: Not on file  Food Insecurity: Not on file  Transportation Needs: Not on file  Physical Activity: Not on file  Stress: Not on file  Social Connections: Not on file  Intimate Partner Violence: Not on file    FAMILY HISTORY: Family History  Problem Relation Age of Onset   Diabetes Mother    Glaucoma Mother    Heart disease Mother    Heart attack Mother    Depression Mother    Hypertension Mother    Diabetes Father    Glaucoma Father    Hypertension  Father    Prostate cancer Father    Diabetes Sister    Depression Sister    Post-traumatic stress disorder Sister     ALLERGIES:  is allergic to penicillins.  MEDICATIONS:  Current Outpatient Medications  Medication Sig Dispense Refill   aspirin EC 81 MG tablet Take 81 mg by mouth daily.     calcitRIOL (ROCALTROL) 0.25 MCG capsule Take 0.25 mcg by mouth daily.     cyclobenzaprine (FLEXERIL) 10 MG tablet Take 1 tablet (10 mg total) by mouth at bedtime. 30 tablet 0   dapagliflozin propanediol (FARXIGA) 5 MG TABS tablet Take 5 mg by mouth daily.     diltiazem (CARDIZEM CD) 360 MG 24 hr capsule Take 1 capsule (360 mg total) by mouth daily. 30 capsule 3   ergocalciferol (VITAMIN D2) 1.25 MG (50000 UT) capsule Take 50,000 Units by mouth once a week.     escitalopram (LEXAPRO) 10 MG tablet Take 1 tablet (10 mg total) by mouth daily. 30 tablet 3   ferrous sulfate 325 (65 FE) MG tablet Take by mouth.     furosemide (LASIX) 20 MG tablet Take 20 mg by mouth daily.     gabapentin (NEURONTIN) 100 MG capsule Take 100 mg by mouth 3 (three) times daily.     hydrALAZINE (APRESOLINE) 50 MG tablet Take 1 tablet (50 mg total) by mouth 3 (three) times daily.     INVEGA SUSTENNA injection SMARTSIG:117 Milligram(s) IM Once a Month     linagliptin (TRADJENTA) 5 MG TABS tablet Take 1 tablet (5 mg total) by mouth daily. 30 tablet 3   losartan (COZAAR) 100 MG tablet Take 100 mg by mouth daily.     multivitamin (RENA-VIT) TABS tablet Take 1 tablet by mouth at bedtime. 30 tablet 3   oxybutynin (DITROPAN XL) 15 MG 24 hr tablet TAKE 1 TABLET BY MOUTH ONCE DAILY FOR BLADDER *WEAR GLOVES IF CRUSHING*     traZODone (DESYREL) 100 MG tablet Take 1 tablet (100 mg total) by mouth at bedtime. 30 tablet 3   acetaminophen (TYLENOL) 325 MG tablet Take by mouth. (Patient not taking: Reported on 02/12/2022)     alum & mag hydroxide-simeth (MAALOX/MYLANTA) 200-200-20 MG/5ML suspension Take by mouth. (Patient not taking: Reported on  02/05/2022)     glycerin adult 2 g suppository Place rectally.     insulin lispro (HUMALOG) 100 UNIT/ML injection Inject 0.04 mLs (4 Units total) into the skin 3 (three) times daily with meals. (Patient not taking: Reported on 02/05/2022) 4 mL 0   loperamide (IMODIUM) 2 MG capsule Take by mouth. (Patient not taking: Reported on 02/12/2022)     Lurasidone HCl 60 MG TABS Take by mouth. (Patient not taking: Reported on 02/05/2022)     melatonin 3 MG TABS tablet Take by mouth. (Patient not taking: Reported on 02/12/2022)     nystatin (MYCOSTATIN) 100000 UNIT/ML suspension Swish  and swallow 500,000 Units 4 (four) times daily (Patient not taking: Reported on 02/05/2022)     OLANZapine (ZYPREXA) 10 MG tablet Take 1 tablet (10 mg total) by mouth at bedtime. (Patient not taking: Reported on 02/05/2022) 30 tablet 3   OLANZapine (ZYPREXA) 10 MG tablet Take by mouth. (Patient not taking: Reported on 02/05/2022)     ondansetron (ZOFRAN) 4 MG tablet Take by mouth. (Patient not taking: Reported on 02/05/2022)     polyethylene glycol powder (GLYCOLAX/MIRALAX) 17 GM/SCOOP powder Take by mouth. (Patient not taking: Reported on 02/05/2022)     No current facility-administered medications for this visit.      PHYSICAL EXAMINATION:   Vitals:   02/12/22 1406  BP: (!) 146/61  Pulse: 63  Resp: 18  Temp: (!) 96.4 F (35.8 C)   Filed Weights   02/12/22 1406  Weight: 118 lb 9.6 oz (53.8 kg)    Physical Exam Vitals and nursing note reviewed.  HENT:     Head: Normocephalic and atraumatic.     Mouth/Throat:     Pharynx: Oropharynx is clear.  Eyes:     Extraocular Movements: Extraocular movements intact.     Pupils: Pupils are equal, round, and reactive to light.  Cardiovascular:     Rate and Rhythm: Normal rate and regular rhythm.  Pulmonary:     Comments: Decreased breath sounds bilaterally.  Abdominal:     Palpations: Abdomen is soft.  Musculoskeletal:        General: Normal range of motion.     Cervical back:  Normal range of motion.  Skin:    General: Skin is warm.  Neurological:     General: No focal deficit present.     Mental Status: She is alert and oriented to person, place, and time.  Psychiatric:        Behavior: Behavior normal.        Judgment: Judgment normal.     LABORATORY DATA:  I have reviewed the data as listed Lab Results  Component Value Date   WBC 10.7 (H) 02/05/2022   HGB 8.1 (L) 02/05/2022   HCT 26.7 (L) 02/05/2022   MCV 90.5 02/05/2022   PLT 410 (H) 02/05/2022   Recent Labs    10/19/21 1313 10/21/21 0605 10/25/21 1012 11/01/21 0713 11/08/21 0650 02/05/22 1151  NA 137 133*  --   --   --  138  K 4.2 3.6  --   --   --  4.7  CL 98 99  --   --   --  108  CO2 26 28  --   --   --  21*  GLUCOSE 192* 155*  --   --   --  109*  BUN 78* 55*  --   --   --  62*  CREATININE 2.59* 2.17*   < > 1.91* 2.14* 3.20*  CALCIUM 9.6 8.7*  --   --   --  9.5  GFRNONAA 20* 25*   < > 29* 26* 16*  PROT 7.9 6.9  --   --   --  8.2*  ALBUMIN 3.1* 2.6*  --   --   --  3.1*  AST 27 18  --   --   --  18  ALT 20 15  --   --   --  20  ALKPHOS 93 87  --   --   --  74  BILITOT 0.3 0.5  --   --   --  0.3   < > = values in this interval not displayed.     DG Bone Survey Met  Result Date: 02/06/2022 CLINICAL DATA:  MGUS EXAM: METASTATIC BONE SURVEY COMPARISON:  None Available. FINDINGS: Metastatic bone survey was performed. Frontal view the chest reveals no lytic or sclerotic lesions in the ribcage. The cervical spine shows multilevel degenerative change. No soft tissue abnormality is seen. No lytic or sclerotic lesions are noted. No compression deformity is seen. Upper extremities show no lytic or sclerotic lesions. Thoracic spine demonstrates no paraspinal mass or pedicle abnormality. No compression deformity is seen. No lytic or sclerotic lesions are noted. The lumbar spine demonstrates mild facet hypertrophic changes and osteophytic changes. No lytic or sclerotic lesion is noted. The pelvis  demonstrates no lytic or sclerotic lesions. The lower extremities are within normal limits again without lytic or sclerotic lesions. No lytic lesions are noted within the skull. IMPRESSION: No evidence of lytic or sclerotic lesions throughout the bony structures. Degenerative changes of the cervical, thoracic and lumbar spine are noted. Electronically Signed   By: Inez Catalina M.D.   On: 02/06/2022 19:46    Lab Results  Component Value Date   KPAFRELGTCHN 163.8 (H) 02/05/2022   LAMBDASER 62.2 (H) 02/05/2022   KAPLAMBRATIO 2.63 (H) 02/05/2022     MGUS (monoclonal gammopathy of unknown significance) #July 2023 -repeat M protein -negative ; kappa lambda light chain ratio 2.63.  Suspect secondary to CKD.   # Anemia-8- 9-[JUNE 2023]secondary to CKD/iron deficiency.[colo-2014] Plan IV iron infusion today- ferritin-12; iron sat 16%.  Proceed with IV iron infusion. Discussed the potential acute infusion reactions with IV iron; which are quite rare.  Patient understands the risk; will proceed with infusions.  #Chronic kidney disease-IV / ? DM vs other--III -followed by Dr.Korrapati    # DISPOSITION:  # referral to GI re: colonoscopy/EGD for anemia # venofer today # venofer every other week x 3 # follow up in 2 months- MD; labs- cbc/cbmp;possible IV iron infusion-Dr.B    All questions were answered. The patient knows to call the clinic with any problems, questions or concerns.      Cammie Sickle, MD 02/12/2022 2:30 PM

## 2022-02-12 NOTE — Progress Notes (Unsigned)
Patient here for follow up. Pt reports that she woke up today with a sore throat.

## 2022-02-12 NOTE — Patient Instructions (Signed)

## 2022-02-12 NOTE — Assessment & Plan Note (Addendum)
#  July 2023 -repeat M protein -negative ; kappa lambda light chain ratio 2.63.  Suspect secondary to CKD.   # Anemia-8- 9-[JUNE 2023]secondary to CKD/iron deficiency.[colo-2014] Plan IV iron infusion today- ferritin-12; iron sat 16%.  Proceed with IV iron infusion. Discussed the potential acute infusion reactions with IV iron; which are quite rare.  Patient understands the risk; will proceed with infusions.  #Chronic kidney disease-IV / ? DM vs other--III -followed by Dr.Korrapati    # DISPOSITION:  # referral to GI re: colonoscopy/EGD for anemia # venofer today # venofer every other week x 3 # follow up in 2 months- MD; labs- cbc/cbmp;possible IV iron infusion-Dr.B

## 2022-02-17 ENCOUNTER — Encounter: Payer: Self-pay | Admitting: Internal Medicine

## 2022-02-25 MED FILL — Iron Sucrose Inj 20 MG/ML (Fe Equiv): INTRAVENOUS | Qty: 10 | Status: AC

## 2022-02-26 ENCOUNTER — Inpatient Hospital Stay: Payer: Medicare Other

## 2022-02-26 VITALS — BP 135/45 | HR 51 | Temp 96.2°F | Resp 18

## 2022-02-26 DIAGNOSIS — D472 Monoclonal gammopathy: Secondary | ICD-10-CM

## 2022-02-26 DIAGNOSIS — N1832 Chronic kidney disease, stage 3b: Secondary | ICD-10-CM

## 2022-02-26 MED ORDER — SODIUM CHLORIDE 0.9 % IV SOLN
200.0000 mg | Freq: Once | INTRAVENOUS | Status: AC
  Administered 2022-02-26: 200 mg via INTRAVENOUS
  Filled 2022-02-26: qty 200

## 2022-02-26 MED ORDER — SODIUM CHLORIDE 0.9 % IV SOLN
Freq: Once | INTRAVENOUS | Status: AC
  Filled 2022-02-26: qty 250

## 2022-02-26 NOTE — Patient Instructions (Signed)

## 2022-03-12 ENCOUNTER — Inpatient Hospital Stay: Payer: Medicare Other | Attending: Internal Medicine

## 2022-03-12 VITALS — BP 142/45 | HR 66 | Temp 97.0°F

## 2022-03-12 DIAGNOSIS — N184 Chronic kidney disease, stage 4 (severe): Secondary | ICD-10-CM | POA: Diagnosis not present

## 2022-03-12 DIAGNOSIS — D472 Monoclonal gammopathy: Secondary | ICD-10-CM

## 2022-03-12 DIAGNOSIS — N1832 Chronic kidney disease, stage 3b: Secondary | ICD-10-CM

## 2022-03-12 DIAGNOSIS — D631 Anemia in chronic kidney disease: Secondary | ICD-10-CM | POA: Diagnosis present

## 2022-03-12 MED ORDER — SODIUM CHLORIDE 0.9 % IV SOLN
Freq: Once | INTRAVENOUS | Status: AC
  Filled 2022-03-12: qty 250

## 2022-03-12 MED ORDER — SODIUM CHLORIDE 0.9 % IV SOLN
200.0000 mg | Freq: Once | INTRAVENOUS | Status: AC
  Administered 2022-03-12: 200 mg via INTRAVENOUS
  Filled 2022-03-12: qty 200

## 2022-03-12 NOTE — Patient Instructions (Signed)

## 2022-03-15 ENCOUNTER — Other Ambulatory Visit: Payer: Self-pay

## 2022-03-15 DIAGNOSIS — M791 Myalgia, unspecified site: Secondary | ICD-10-CM | POA: Insufficient documentation

## 2022-03-15 DIAGNOSIS — E119 Type 2 diabetes mellitus without complications: Secondary | ICD-10-CM | POA: Diagnosis not present

## 2022-03-15 DIAGNOSIS — M79604 Pain in right leg: Secondary | ICD-10-CM | POA: Diagnosis present

## 2022-03-15 DIAGNOSIS — Z20822 Contact with and (suspected) exposure to covid-19: Secondary | ICD-10-CM | POA: Diagnosis not present

## 2022-03-15 DIAGNOSIS — R600 Localized edema: Secondary | ICD-10-CM | POA: Diagnosis not present

## 2022-03-15 DIAGNOSIS — I10 Essential (primary) hypertension: Secondary | ICD-10-CM | POA: Diagnosis not present

## 2022-03-15 LAB — RESP PANEL BY RT-PCR (FLU A&B, COVID) ARPGX2
Influenza A by PCR: NEGATIVE
Influenza B by PCR: NEGATIVE
SARS Coronavirus 2 by RT PCR: NEGATIVE

## 2022-03-15 LAB — COMPREHENSIVE METABOLIC PANEL
ALT: 18 U/L (ref 0–44)
AST: 18 U/L (ref 15–41)
Albumin: 3.3 g/dL — ABNORMAL LOW (ref 3.5–5.0)
Alkaline Phosphatase: 65 U/L (ref 38–126)
Anion gap: 8 (ref 5–15)
BUN: 56 mg/dL — ABNORMAL HIGH (ref 8–23)
CO2: 23 mmol/L (ref 22–32)
Calcium: 9.9 mg/dL (ref 8.9–10.3)
Chloride: 107 mmol/L (ref 98–111)
Creatinine, Ser: 3.49 mg/dL — ABNORMAL HIGH (ref 0.44–1.00)
GFR, Estimated: 14 mL/min — ABNORMAL LOW (ref >60)
Glucose, Bld: 104 mg/dL — ABNORMAL HIGH (ref 70–99)
Potassium: 5.1 mmol/L (ref 3.5–5.1)
Sodium: 138 mmol/L (ref 135–145)
Total Bilirubin: 0.5 mg/dL (ref 0.3–1.2)
Total Protein: 7.6 g/dL (ref 6.5–8.1)

## 2022-03-15 LAB — CBC WITH DIFFERENTIAL/PLATELET
Abs Immature Granulocytes: 0.07 10*3/uL (ref 0.00–0.07)
Basophils Absolute: 0 10*3/uL (ref 0.0–0.1)
Basophils Relative: 0 %
Eosinophils Absolute: 0.1 10*3/uL (ref 0.0–0.5)
Eosinophils Relative: 1 %
HCT: 27.9 % — ABNORMAL LOW (ref 36.0–46.0)
Hemoglobin: 8.4 g/dL — ABNORMAL LOW (ref 12.0–15.0)
Immature Granulocytes: 1 %
Lymphocytes Relative: 24 %
Lymphs Abs: 2.4 10*3/uL (ref 0.7–4.0)
MCH: 27.8 pg (ref 26.0–34.0)
MCHC: 30.1 g/dL (ref 30.0–36.0)
MCV: 92.4 fL (ref 80.0–100.0)
Monocytes Absolute: 1.3 10*3/uL — ABNORMAL HIGH (ref 0.1–1.0)
Monocytes Relative: 13 %
Neutro Abs: 6 10*3/uL (ref 1.7–7.7)
Neutrophils Relative %: 61 %
Platelets: 295 10*3/uL (ref 150–400)
RBC: 3.02 MIL/uL — ABNORMAL LOW (ref 3.87–5.11)
RDW: 14.6 % (ref 11.5–15.5)
WBC: 9.9 10*3/uL (ref 4.0–10.5)
nRBC: 0 % (ref 0.0–0.2)

## 2022-03-15 NOTE — ED Notes (Signed)
Lav, green tops and resp swab sent to lab

## 2022-03-15 NOTE — ED Triage Notes (Signed)
Pt presents to ER via ems with c/o BIL leg pain x1 month.  Pt states she  came in today d/t "almost falling."  Pt denies any trauma to her legs.  Denies hx of DVT or taking blood thinners.  Pt is A&O x4 at this time in NAD in triage.

## 2022-03-15 NOTE — ED Triage Notes (Signed)
Patient arrived by [place] from [facility]. C/o bilateral leg pain/aching and intermittent nose bleed X2 days. EMS reports no bleeding upon their arrival.   Hx schizophrenic and bipolar  EMS vitals: 124/55 b/p 84HR  18RR 98% RA

## 2022-03-15 NOTE — ED Provider Triage Note (Signed)
Emergency Medicine Provider Triage Evaluation Note  Alicia Rogers , a 61 y.o. female  was evaluated in triage.  Pt complains of bilateral leg pain for several days. No swelling. No trouble walking. No back pain or abd pain  Review of Systems  Positive: Leg pain Negative: Paresthesias, back pain, abd pain  Physical Exam  BP (!) 175/69 (BP Location: Right Arm)   Pulse 63   Temp (!) 97.5 F (36.4 C) (Oral)   Resp 16   Ht '5\' 2"'$  (1.575 m)   Wt 52.2 kg   SpO2 100%   BMI 21.03 kg/m  Gen:   Awake, no distress   Resp:  Normal effort  MSK:   Moves extremities without difficulty  Other:    Medical Decision Making  Medically screening exam initiated at 8:19 PM.  Appropriate orders placed.  Alicia Rogers was informed that the remainder of the evaluation will be completed by another provider, this initial triage assessment does not replace that evaluation, and the importance of remaining in the ED until their evaluation is complete.     Marquette Old, PA-C 03/15/22 2020

## 2022-03-16 ENCOUNTER — Emergency Department
Admission: EM | Admit: 2022-03-16 | Discharge: 2022-03-16 | Disposition: A | Discharge status: Home / Self Care | Payer: Medicare Other | Attending: Emergency Medicine | Admitting: Emergency Medicine

## 2022-03-16 DIAGNOSIS — R609 Edema, unspecified: Secondary | ICD-10-CM

## 2022-03-16 DIAGNOSIS — M7918 Myalgia, other site: Secondary | ICD-10-CM

## 2022-03-16 MED ORDER — TRAMADOL HCL 50 MG PO TABS
50.0000 mg | ORAL_TABLET | Freq: Once | ORAL | Status: AC
  Administered 2022-03-16: 50 mg via ORAL
  Filled 2022-03-16: qty 1

## 2022-03-16 MED ORDER — MEDICAL COMPRESSION STOCKINGS MISC: 0 refills | Status: DC

## 2022-03-16 MED ORDER — TRAMADOL HCL 50 MG PO TABS: 50.0000 mg | ORAL_TABLET | Freq: Two times a day (BID) | ORAL | 0 refills | Status: DC | PRN

## 2022-03-16 NOTE — ED Provider Notes (Signed)
   [facility] Provider Note    Event Date/Time   First MD Initiated Contact with Patient 03/16/22 0204     (approximate)  History   Chief Complaint: Leg Pain  HPI  Alicia Rogers is a 61 y.o. female with a past medical history of depression, diabetes, hypertension, schizophrenia presents emergency department for bilateral leg pain.  According to the patient over the past 2 days she has been experiencing pain in both of her legs.  Patient states she has been able to walk but it does hurt at times when she is walking.  Patient denies any trauma or falls.  Physical Exam   Triage Vital Signs: ED Triage Vitals  Enc Vitals Group     BP 03/15/22 2010 (!) 175/69     Pulse Rate 03/15/22 2010 63     Resp 03/15/22 2010 16     Temp 03/15/22 2010 (!) 97.5 F (36.4 C)     Temp Source 03/15/22 2010 Oral     SpO2 03/15/22 2010 100 %     Weight 03/15/22 2010 115 lb (52.2 kg)     Height 03/15/22 2010 '5\' 2"'$  (1.575 m)     Head Circumference --      Peak Flow --      Pain Score 03/15/22 2025 7     Pain Loc --      Pain Edu? --      Excl. in [place]? --     Most recent vital signs: Vitals:   03/16/22 0023 03/16/22 0200  BP: (!) 188/57 (!) 177/56  Pulse: (!) 59 62  Resp: 16 18  Temp:  98.1 F (36.7 C)  SpO2: 99% 97%    General: Awake, no distress.  CV:  Good peripheral perfusion.  Regular rate and rhythm  Resp:  Normal effort.  Equal breath sounds bilaterally.  Abd:  No distention.  Soft, nontender.  No rebound or guarding. Other:  Mild/1+ lower extreme edema bilaterally.  1+ DP pulses bilaterally with warm extremities throughout.  Nontender to palpation in the calfs.  Good range of motion.   ED Results / Procedures / Treatments    MEDICATIONS ORDERED IN ED: Medications  traMADol (ULTRAM) tablet 50 mg (has no administration in time range)     IMPRESSION / MDM / ASSESSMENT AND PLAN / ED COURSE  I reviewed the triage vital signs and the nursing  notes.  Patient's presentation is most consistent with acute illness / injury with system symptoms.  Patient presents emergency department for bilateral leg pain.  Overall the patient appears well, no distress.  Patient does have chronic kidney disease on her chemistry however largely unchanged from historical values.  Patient CBC does show anemia again largely unchanged from historical values.  Patient states she often gets pain in her legs but states it has been somewhat worse over the past 2 days.  Patient has a reassuring physical exam no concern for vascular compromise does have mild swelling.  We will prescribe compression stockings for the patient.  We will also prescribe a very short course of tramadol to be used if needed for significant pain otherwise the patient states she typically just uses Tylenol.  Patient to follow-up with her doctor.  FINAL CLINICAL IMPRESSION(S) / ED DIAGNOSES   Musculoskeletal pain Peripheral edema    Note:  This document was prepared using Dragon voice recognition software and may include unintentional dictation errors.   Harvest Dark, MD 03/16/22 0230

## 2022-03-16 NOTE — ED Notes (Signed)
Attempted to call sister for patient. She is her ride home.

## 2022-03-16 NOTE — ED Notes (Signed)
Attempted to call sister again.

## 2022-03-16 NOTE — ED Notes (Signed)
Attempted to call her sister again with no success.

## 2022-03-16 NOTE — ED Notes (Signed)
Attempted to call sister again. No way to leave a message. Mailbox full

## 2022-03-16 NOTE — ED Notes (Signed)
Pt wheeled to waiting room. Pt verbalized understanding of discharge instructions. Pt D/C with her nephew.

## 2022-03-16 NOTE — ED Notes (Signed)
Attempted to call [facility] with no answer.

## 2022-03-26 ENCOUNTER — Inpatient Hospital Stay: Payer: Medicare Other

## 2022-03-26 VITALS — BP 151/56 | HR 62 | Temp 97.5°F | Resp 18

## 2022-03-26 DIAGNOSIS — D472 Monoclonal gammopathy: Secondary | ICD-10-CM

## 2022-03-26 DIAGNOSIS — N1832 Chronic kidney disease, stage 3b: Secondary | ICD-10-CM

## 2022-03-26 DIAGNOSIS — N184 Chronic kidney disease, stage 4 (severe): Secondary | ICD-10-CM | POA: Diagnosis not present

## 2022-03-26 MED ORDER — SODIUM CHLORIDE 0.9 % IV SOLN
Freq: Once | INTRAVENOUS | Status: AC
  Filled 2022-03-26: qty 250

## 2022-03-26 MED ORDER — SODIUM CHLORIDE 0.9 % IV SOLN
200.0000 mg | Freq: Once | INTRAVENOUS | Status: AC
  Administered 2022-03-26: 200 mg via INTRAVENOUS
  Filled 2022-03-26: qty 200

## 2022-03-26 NOTE — Patient Instructions (Signed)

## 2022-04-15 ENCOUNTER — Ambulatory Visit (INDEPENDENT_AMBULATORY_CARE_PROVIDER_SITE_OTHER): Payer: Medicare Other | Admitting: Podiatry

## 2022-04-15 DIAGNOSIS — B351 Tinea unguium: Secondary | ICD-10-CM

## 2022-04-15 DIAGNOSIS — M79675 Pain in left toe(s): Secondary | ICD-10-CM

## 2022-04-15 DIAGNOSIS — M79674 Pain in right toe(s): Secondary | ICD-10-CM | POA: Diagnosis not present

## 2022-04-15 NOTE — Progress Notes (Signed)
   SUBJECTIVE Patient presents to office today complaining of elongated, thickened nails that cause pain while ambulating in shoes.  Patient is unable to trim their own nails. Patient is here for further evaluation and treatment.  Past Medical History:  Diagnosis Date   Depression    Diabetes mellitus without complication ([place])    Diabetes mellitus, type II ([place])    Hypertension    Schizophrenia ([place])     OBJECTIVE General Patient is awake, alert, and oriented x 3 and in no acute distress. Derm Skin is dry and supple bilateral. Negative open lesions or macerations. Remaining integument unremarkable. Nails are tender, long, thickened and dystrophic with subungual debris, consistent with onychomycosis, 1-5 bilateral. No signs of infection noted. Vasc  DP and PT pedal pulses palpable bilaterally. Temperature gradient within normal limits.  Neuro Epicritic and protective threshold sensation grossly intact bilaterally.  Musculoskeletal Exam No symptomatic pedal deformities noted bilateral. Muscular strength within normal limits.  ASSESSMENT 1.  Pain due to onychomycosis of toenails both  PLAN OF CARE 1. Patient evaluated today.  2. Instructed to maintain good pedal hygiene and foot care.  3. Mechanical debridement of nails 1-5 bilaterally performed using a nail nipper. Filed with dremel without incident.  4. Return to clinic in 3 mos.   Fax [phone]

## 2022-04-16 ENCOUNTER — Ambulatory Visit: Payer: Medicare Other

## 2022-04-16 ENCOUNTER — Ambulatory Visit: Payer: Medicare Other | Admitting: Internal Medicine

## 2022-04-16 ENCOUNTER — Other Ambulatory Visit: Payer: Medicare Other

## 2022-04-27 ENCOUNTER — Other Ambulatory Visit: Payer: Self-pay | Admitting: *Deleted

## 2022-04-27 DIAGNOSIS — D649 Anemia, unspecified: Secondary | ICD-10-CM

## 2022-04-27 MED FILL — Iron Sucrose Inj 20 MG/ML (Fe Equiv): INTRAVENOUS | Qty: 10 | Status: AC

## 2022-04-28 ENCOUNTER — Inpatient Hospital Stay: Payer: Medicare Other | Admitting: Internal Medicine

## 2022-04-28 ENCOUNTER — Inpatient Hospital Stay: Payer: Medicare Other

## 2022-05-05 ENCOUNTER — Inpatient Hospital Stay: Payer: Medicare Other | Attending: Internal Medicine

## 2022-05-05 ENCOUNTER — Inpatient Hospital Stay (HOSPITAL_BASED_OUTPATIENT_CLINIC_OR_DEPARTMENT_OTHER): Payer: Medicare Other | Admitting: Internal Medicine

## 2022-05-05 ENCOUNTER — Encounter: Payer: Self-pay | Admitting: Internal Medicine

## 2022-05-05 ENCOUNTER — Inpatient Hospital Stay: Payer: Medicare Other

## 2022-05-05 DIAGNOSIS — D649 Anemia, unspecified: Secondary | ICD-10-CM | POA: Diagnosis not present

## 2022-05-05 DIAGNOSIS — D472 Monoclonal gammopathy: Secondary | ICD-10-CM

## 2022-05-05 DIAGNOSIS — N184 Chronic kidney disease, stage 4 (severe): Secondary | ICD-10-CM | POA: Insufficient documentation

## 2022-05-05 DIAGNOSIS — D631 Anemia in chronic kidney disease: Secondary | ICD-10-CM | POA: Diagnosis present

## 2022-05-05 DIAGNOSIS — N1832 Chronic kidney disease, stage 3b: Secondary | ICD-10-CM

## 2022-05-05 LAB — CBC WITH DIFFERENTIAL/PLATELET
Abs Immature Granulocytes: 0.08 10*3/uL — ABNORMAL HIGH (ref 0.00–0.07)
Basophils Absolute: 0 10*3/uL (ref 0.0–0.1)
Basophils Relative: 0 %
Eosinophils Absolute: 0.1 10*3/uL (ref 0.0–0.5)
Eosinophils Relative: 1 %
HCT: 29.5 % — ABNORMAL LOW (ref 36.0–46.0)
Hemoglobin: 9.1 g/dL — ABNORMAL LOW (ref 12.0–15.0)
Immature Granulocytes: 1 %
Lymphocytes Relative: 22 %
Lymphs Abs: 2.3 10*3/uL (ref 0.7–4.0)
MCH: 28.2 pg (ref 26.0–34.0)
MCHC: 30.8 g/dL (ref 30.0–36.0)
MCV: 91.3 fL (ref 80.0–100.0)
Monocytes Absolute: 1.2 10*3/uL — ABNORMAL HIGH (ref 0.1–1.0)
Monocytes Relative: 11 %
Neutro Abs: 6.9 10*3/uL (ref 1.7–7.7)
Neutrophils Relative %: 65 %
Platelets: 326 10*3/uL (ref 150–400)
RBC: 3.23 MIL/uL — ABNORMAL LOW (ref 3.87–5.11)
RDW: 14.9 % (ref 11.5–15.5)
WBC: 10.6 10*3/uL — ABNORMAL HIGH (ref 4.0–10.5)
nRBC: 0 % (ref 0.0–0.2)

## 2022-05-05 LAB — COMPREHENSIVE METABOLIC PANEL
ALT: 20 U/L (ref 0–44)
AST: 20 U/L (ref 15–41)
Albumin: 3.1 g/dL — ABNORMAL LOW (ref 3.5–5.0)
Alkaline Phosphatase: 86 U/L (ref 38–126)
Anion gap: 6 (ref 5–15)
BUN: 44 mg/dL — ABNORMAL HIGH (ref 8–23)
CO2: 24 mmol/L (ref 22–32)
Calcium: 9.7 mg/dL (ref 8.9–10.3)
Chloride: 109 mmol/L (ref 98–111)
Creatinine, Ser: 3.05 mg/dL — ABNORMAL HIGH (ref 0.44–1.00)
GFR, Estimated: 17 mL/min — ABNORMAL LOW (ref >60)
Glucose, Bld: 157 mg/dL — ABNORMAL HIGH (ref 70–99)
Potassium: 4.3 mmol/L (ref 3.5–5.1)
Sodium: 139 mmol/L (ref 135–145)
Total Bilirubin: 0.1 mg/dL — ABNORMAL LOW (ref 0.3–1.2)
Total Protein: 7.5 g/dL (ref 6.5–8.1)

## 2022-05-05 MED ORDER — SODIUM CHLORIDE 0.9 % IV SOLN
Freq: Once | INTRAVENOUS | Status: AC
  Filled 2022-05-05: qty 250

## 2022-05-05 MED ORDER — SODIUM CHLORIDE 0.9 % IV SOLN
200.0000 mg | Freq: Once | INTRAVENOUS | Status: AC
  Administered 2022-05-05: 200 mg via INTRAVENOUS
  Filled 2022-05-05: qty 200

## 2022-05-05 NOTE — Patient Instructions (Signed)
[facility]  Discharge Instructions: Thank you for choosing [facility] to provide your oncology and hematology care.  If you have a lab appointment with the Cancer Center, please go directly to the Cancer Center and check in at the registration area.  Wear comfortable clothing and clothing appropriate for easy access to any Portacath or PICC line.   We strive to give you quality time with your provider. You may need to reschedule your appointment if you arrive late (15 or more minutes).  Arriving late affects you and other patients whose appointments are after yours.  Also, if you miss three or more appointments without notifying the office, you may be dismissed from the clinic at the provider's discretion.      For prescription refill requests, have your pharmacy contact our office and allow 72 hours for refills to be completed.    Today you received the following chemotherapy and/or immunotherapy agents Venofer.      To help prevent nausea and vomiting after your treatment, we encourage you to take your nausea medication as directed.  BELOW ARE SYMPTOMS THAT SHOULD BE REPORTED IMMEDIATELY: *FEVER GREATER THAN 100.4 F (38 C) OR HIGHER *CHILLS OR SWEATING *NAUSEA AND VOMITING THAT IS NOT CONTROLLED WITH YOUR NAUSEA MEDICATION *UNUSUAL SHORTNESS OF BREATH *UNUSUAL BRUISING OR BLEEDING *URINARY PROBLEMS (pain or burning when urinating, or frequent urination) *BOWEL PROBLEMS (unusual diarrhea, constipation, pain near the anus) TENDERNESS IN MOUTH AND THROAT WITH OR WITHOUT PRESENCE OF ULCERS (sore throat, sores in mouth, or a toothache) UNUSUAL RASH, SWELLING OR PAIN  UNUSUAL VAGINAL DISCHARGE OR ITCHING   Items with * indicate a potential emergency and should be followed up as soon as possible or go to the Emergency Department if any problems should occur.  Please show the CHEMOTHERAPY ALERT CARD or IMMUNOTHERAPY ALERT CARD at check-in to  the Emergency Department and triage nurse.  Should you have questions after your visit or need to cancel or reschedule your appointment, please contact [facility]  [phone] and follow the prompts.  Office hours are 8:00 a.m. to 4:30 p.m. Monday - Friday. Please note that voicemails left after 4:00 p.m. may not be returned until the following business day.  We are closed weekends and major holidays. You have access to a nurse at all times for urgent questions. Please call the main number to the clinic [phone] and follow the prompts.  For any non-urgent questions, you may also contact your provider using MyChart. We now offer e-Visits for anyone 18 and older to request care online for non-urgent symptoms. For details visit [email or web address].   Also download the MyChart app! Go to the app store, search "MyChart", open the app, select [facility], and log in with your MyChart username and password.  Masks are optional in the cancer centers. If you would like for your care team to wear a mask while they are taking care of you, please let them know. For doctor visits, patients may have with them one support person who is at least 62 years old. At this time, visitors are not allowed in the infusion area.   

## 2022-05-05 NOTE — Progress Notes (Signed)
Patient here today for follow up regarding anemia. Patient denies concerns.

## 2022-05-05 NOTE — Progress Notes (Signed)
Alicia Rogers CONSULT NOTE  Patient Care Team: Romualdo Bolk, FNP as PCP - General (Nurse Practitioner) Cammie Sickle, MD as Consulting Physician (Oncology)  CHIEF COMPLAINTS/PURPOSE OF CONSULTATION: Monoclonal gammopathy   #  JULY 2023-Anemia-8- 9-secondary to CKD/iron deficiency.[colo-2014] Plan IV iron infusion today- ferritin-12; iron sat 16%.   IV iron infusion.    A poorly-defined band of restricted protein mobility is detected       in the gamma globulins. It is unlikely that this may represent a       monoclonal protein; however, immunofixation analysis is available       if clinically indicated.  150.7 High      Free Lambda Lt Chains, S 64.0 High     Free Kappa/Lambda Ratio 2.35 High      Component    Free Kappa Lt Chains, S  Free Lambda Lt Chains, S  Free Kappa/Lambda Ratio    # CKD stage-IV Dr.Korrapati]; schizoaffective disorder [Psyche;?] ; DM- on insulin.   HISTORY OF PRESENTING ILLNESS: Alone.   Ambulating independently. Poor historian.   Alicia Rogers 61 y.o.  female with schizoaffective disorder with CKD; anemia; possible monoclonal gammopathy is here for follow-up.   Chronic joint pains however no new onset of bone pain.  Chronic mild fatigue.   Denies any nausea vomiting.  Complains of chronic leg swelling.  Not any worse.  Review of Systems  Constitutional:  Negative for chills, diaphoresis, fever, malaise/fatigue and weight loss.  HENT:  Negative for nosebleeds and sore throat.   Eyes:  Negative for double vision.  Respiratory:  Negative for cough, hemoptysis, sputum production, shortness of breath and wheezing.   Cardiovascular:  Positive for leg swelling. Negative for chest pain, palpitations and orthopnea.  Gastrointestinal:  Negative for abdominal pain, blood in stool, constipation, diarrhea, heartburn, melena, nausea and vomiting.  Genitourinary:  Negative for dysuria, frequency and urgency.  Musculoskeletal:   Positive for back pain and joint pain.  Skin: Negative.  Negative for itching and rash.  Neurological:  Negative for dizziness, tingling, focal weakness, weakness and headaches.  Endo/Heme/Allergies:  Does not bruise/bleed easily.  Psychiatric/Behavioral:  Negative for depression. The patient is not nervous/anxious and does not have insomnia.     MEDICAL HISTORY:  Past Medical History:  Diagnosis Date   Depression    Diabetes mellitus without complication ([place])    Diabetes mellitus, type II ([place])    Hypertension    Schizophrenia ([place])     SURGICAL HISTORY: Past Surgical History:  Procedure Laterality Date   colonscopy      SOCIAL HISTORY: Social History   Socioeconomic History   Marital status: Single    Spouse name: Not on file   Number of children: Not on file   Years of education: Not on file   Highest education level: Not on file  Occupational History   Not on file  Tobacco Use   Smoking status: Every Day    Packs/day: 1.00    Types: Cigarettes    Start date: 04/21/1978   Smokeless tobacco: Never  Vaping Use   Vaping Use: Former  Substance and Sexual Activity   Alcohol use: No    Alcohol/week: 0.0 standard drinks of alcohol   Drug use: No   Sexual activity: Not Currently  Other Topics Concern   Not on file  Social History Narrative   Not on file   Social Determinants of Health   Financial Resource Strain: Not on file  Food  Insecurity: Not on file  Transportation Needs: Not on file  Physical Activity: Not on file  Stress: Not on file  Social Connections: Not on file  Intimate Partner Violence: Not on file    FAMILY HISTORY: Family History  Problem Relation Age of Onset   Diabetes Mother    Glaucoma Mother    Heart disease Mother    Heart attack Mother    Depression Mother    Hypertension Mother    Diabetes Father    Glaucoma Father    Hypertension Father    Prostate cancer Father    Diabetes Sister    Depression Sister    Post-traumatic  stress disorder Sister     ALLERGIES:  is allergic to penicillins.  MEDICATIONS:  Current Outpatient Medications  Medication Sig Dispense Refill   acetaminophen (TYLENOL) 325 MG tablet Take by mouth. (Patient not taking: Reported on 02/12/2022)     alum & mag hydroxide-simeth (MAALOX/MYLANTA) 200-200-20 MG/5ML suspension Take by mouth. (Patient not taking: Reported on 02/05/2022)     aspirin EC 81 MG tablet Take 81 mg by mouth daily.     calcitRIOL (ROCALTROL) 0.25 MCG capsule Take 0.25 mcg by mouth daily.     cyclobenzaprine (FLEXERIL) 10 MG tablet Take 1 tablet (10 mg total) by mouth at bedtime. 30 tablet 0   dapagliflozin propanediol (FARXIGA) 5 MG TABS tablet Take 5 mg by mouth daily.     diltiazem (CARDIZEM CD) 360 MG 24 hr capsule Take 1 capsule (360 mg total) by mouth daily. 30 capsule 3   Elastic Bandages & Supports (MEDICAL COMPRESSION STOCKINGS) MISC Please provide 1mHg compression stockings 1 each 0   ergocalciferol (VITAMIN D2) 1.25 MG (50000 UT) capsule Take 50,000 Units by mouth once a week.     escitalopram (LEXAPRO) 10 MG tablet Take 1 tablet (10 mg total) by mouth daily. 30 tablet 3   ferrous sulfate 325 (65 FE) MG tablet Take by mouth.     furosemide (LASIX) 20 MG tablet Take 20 mg by mouth daily.     gabapentin (NEURONTIN) 100 MG capsule Take 100 mg by mouth 3 (three) times daily.     glycerin adult 2 g suppository Place rectally.     hydrALAZINE (APRESOLINE) 50 MG tablet Take 1 tablet (50 mg total) by mouth 3 (three) times daily.     insulin lispro (HUMALOG) 100 UNIT/ML injection Inject 0.04 mLs (4 Units total) into the skin 3 (three) times daily with meals. (Patient not taking: Reported on 02/05/2022) 4 mL 0   INVEGA SUSTENNA injection SMARTSIG:117 Milligram(s) IM Once a Month     linagliptin (TRADJENTA) 5 MG TABS tablet Take 1 tablet (5 mg total) by mouth daily. 30 tablet 3   loperamide (IMODIUM) 2 MG capsule Take by mouth. (Patient not taking: Reported on 02/12/2022)      losartan (COZAAR) 100 MG tablet Take 100 mg by mouth daily.     Lurasidone HCl 60 MG TABS Take by mouth. (Patient not taking: Reported on 02/05/2022)     melatonin 3 MG TABS tablet Take by mouth. (Patient not taking: Reported on 02/12/2022)     multivitamin (RENA-VIT) TABS tablet Take 1 tablet by mouth at bedtime. 30 tablet 3   nystatin (MYCOSTATIN) 100000 UNIT/ML suspension Swish and swallow 500,000 Units 4 (four) times daily (Patient not taking: Reported on 02/05/2022)     OLANZapine (ZYPREXA) 10 MG tablet Take 1 tablet (10 mg total) by mouth at bedtime. (Patient not taking: Reported on 02/05/2022)  30 tablet 3   OLANZapine (ZYPREXA) 10 MG tablet Take by mouth. (Patient not taking: Reported on 02/05/2022)     ondansetron (ZOFRAN) 4 MG tablet Take by mouth. (Patient not taking: Reported on 02/05/2022)     oxybutynin (DITROPAN XL) 15 MG 24 hr tablet TAKE 1 TABLET BY MOUTH ONCE DAILY FOR BLADDER *WEAR GLOVES IF CRUSHING*     polyethylene glycol powder (GLYCOLAX/MIRALAX) 17 GM/SCOOP powder Take by mouth. (Patient not taking: Reported on 02/05/2022)     traMADol (ULTRAM) 50 MG tablet Take 1 tablet (50 mg total) by mouth 2 (two) times daily as needed for moderate pain. 15 tablet 0   traZODone (DESYREL) 100 MG tablet Take 1 tablet (100 mg total) by mouth at bedtime. 30 tablet 3   No current facility-administered medications for this visit.      PHYSICAL EXAMINATION:   Vitals:   05/05/22 1338  BP: (!) 163/65  Pulse: 61  Resp: 18  Temp: (!) 97.1 F (36.2 C)   Filed Weights   05/05/22 1338  Weight: 120 lb 3.2 oz (54.5 kg)    Physical Exam Vitals and nursing note reviewed.  HENT:     Head: Normocephalic and atraumatic.     Mouth/Throat:     Pharynx: Oropharynx is clear.  Eyes:     Extraocular Movements: Extraocular movements intact.     Pupils: Pupils are equal, round, and reactive to light.  Cardiovascular:     Rate and Rhythm: Normal rate and regular rhythm.  Pulmonary:     Comments:  Decreased breath sounds bilaterally.  Abdominal:     Palpations: Abdomen is soft.  Musculoskeletal:        General: Normal range of motion.     Cervical back: Normal range of motion.  Skin:    General: Skin is warm.  Neurological:     General: No focal deficit present.     Mental Status: She is alert and oriented to person, place, and time.  Psychiatric:        Behavior: Behavior normal.        Judgment: Judgment normal.     LABORATORY DATA:  I have reviewed the data as listed Lab Results  Component Value Date   WBC 10.6 (H) 05/05/2022   HGB 9.1 (L) 05/05/2022   HCT 29.5 (L) 05/05/2022   MCV 91.3 05/05/2022   PLT 326 05/05/2022   Recent Labs    02/05/22 1151 03/15/22 2012 05/05/22 1312  NA 138 138 139  K 4.7 5.1 4.3  CL 108 107 109  CO2 21* 23 24  GLUCOSE 109* 104* 157*  BUN 62* 56* 44*  CREATININE 3.20* 3.49* 3.05*  CALCIUM 9.5 9.9 9.7  GFRNONAA 16* 14* 17*  PROT 8.2* 7.6 7.5  ALBUMIN 3.1* 3.3* 3.1*  AST '18 18 20  '$ ALT '20 18 20  '$ ALKPHOS 74 65 86  BILITOT 0.3 0.5 0.1*     No results found.  Lab Results  Component Value Date   KPAFRELGTCHN 163.8 (H) 02/05/2022   LAMBDASER 62.2 (H) 02/05/2022   KAPLAMBRATIO 2.63 (H) 02/05/2022     Symptomatic anemia # Anemia-8- 9-[JUNE 2023]secondary to CKD/iron deficiency.[colo-2014]  ferritin-12; iron sat 16%. Plan IV iron infusion today-also awaiting GI evaluation December 2023.   # #July 2023 -repeat M protein -negative ; kappa lambda light chain ratio 2.63.  Suspect secondary to CKD.   # Chronic kidney disease-IV / ? DM vs other--III -followed by Dr.Korrapati    # DISPOSITION:  #  referral to GI re: colonoscopy/EGD for anemia- II request  # venofer today # follow up in 3 months- MD; labs- cbc/cbmp; iron studies;ferritin- ;possible IV iron infusion OR retacrit- -Dr.B    All questions were answered. The patient knows to call the clinic with any problems, questions or concerns.      Cammie Sickle,  MD 05/07/2022 10:03 AM

## 2022-05-05 NOTE — Assessment & Plan Note (Addendum)
#   Anemia-8- 9-[JUNE 2023]secondary to CKD/iron deficiency.[colo-2014]  ferritin-12; iron sat 16%. Plan IV iron infusion today-also awaiting GI evaluation December 2023.   # #July 2023 -repeat M protein -negative ; kappa lambda light chain ratio 2.63.  Suspect secondary to CKD.   # Chronic kidney disease-IV / ? DM vs other--III -followed by Dr.Korrapati    # DISPOSITION:  # referral to GI re: colonoscopy/EGD for anemia- II request  # venofer today # follow up in 3 months- MD; labs- cbc/cbmp; iron studies;ferritin- ;possible IV iron infusion OR retacrit- -Dr.B

## 2022-05-05 NOTE — Assessment & Plan Note (Deleted)
#  July 2023 -repeat M protein -negative ; kappa lambda light chain ratio 2.63.  Suspect secondary to CKD.   # Anemia-8- 9-[JUNE 2023]secondary to CKD/iron deficiency.[colo-2014] Plan IV iron infusion today- ferritin-12; iron sat 16%.  Proceed with IV iron infusion. Discussed the potential acute infusion reactions with IV iron; which are quite rare.  Patient understands the risk; will proceed with infusions.  Will refer to GI.  #Chronic kidney disease-IV / ? DM vs other--III -followed by Dr.Korrapati    # DISPOSITION:  # referral to GI re: colonoscopy/EGD for anemia # venofer today # venofer every other week x 3 # follow up in 2 months- MD; labs- cbc/cbmp;possible IV iron infusion-Dr.B

## 2022-05-07 ENCOUNTER — Encounter: Payer: Self-pay | Admitting: Internal Medicine

## 2022-06-30 DIAGNOSIS — R300 Dysuria: Secondary | ICD-10-CM | POA: Insufficient documentation

## 2022-07-06 ENCOUNTER — Encounter: Payer: Self-pay | Admitting: Gastroenterology

## 2022-07-06 ENCOUNTER — Encounter: Payer: Self-pay | Admitting: Internal Medicine

## 2022-07-06 ENCOUNTER — Ambulatory Visit (INDEPENDENT_AMBULATORY_CARE_PROVIDER_SITE_OTHER): Payer: Medicare Other | Admitting: Gastroenterology

## 2022-07-06 VITALS — BP 145/63 | HR 56 | Temp 97.8°F | Ht 62.0 in | Wt 127.2 lb

## 2022-07-06 DIAGNOSIS — D509 Iron deficiency anemia, unspecified: Secondary | ICD-10-CM

## 2022-07-06 MED ORDER — PEG 3350-KCL-NABCB-NACL-NASULF 236 G PO SOLR: 4000.0000 mL | Freq: Once | ORAL | 0 refills | Status: AC

## 2022-07-06 NOTE — Progress Notes (Signed)
Alicia Darby, MD [address]  [place], [place] [address]  Main: [phone]  Fax: [phone]    Gastroenterology Consultation  Referring Provider:     Romualdo Bolk, FNP Primary Care Physician:  Romualdo Bolk, FNP Primary Gastroenterologist:  Dr. Cephas Rogers Reason for Consultation: Iron deficiency anemia        HPI:   Alicia Rogers is a 61 y.o. female referred by Romualdo Bolk, FNP  for consultation & management of iron deficiency anemia.  Patient is referred by Dr. Rogue Bussing for evaluation of iron deficiency anemia.  Patient has history of diabetes, stage IV CKD, hypertension who lives in a family care home.  Based on the labs from July 2023 her serum ferritin was 24, she has chronic anemia, since 06/2020, received parenteral iron therapy and currently on oral iron.  Patient reports mild abdominal discomfort and irregular bowel habits.  She reports her stools are dark and has bowel movement about 2-3 times a week only.  She does report intermittent nausea.  She denies any weight loss, rectal bleeding.  She does smoke cigarettes Denies alcohol use  NSAIDs: None  Antiplts/Anticoagulants/Anti thrombotics: None  GI Procedures: Reports undergoing endoscopy and colonoscopy several years ago, not aware of the results Denies any family history of GI malignancy  Past Medical History:  Diagnosis Date   Depression    Diabetes mellitus without complication ([place])    Diabetes mellitus, type II ([place])    Hypertension    Schizophrenia ([place])     Past Surgical History:  Procedure Laterality Date   colonscopy       Current Outpatient Medications:    aspirin EC 81 MG tablet, Take 81 mg by mouth daily., Disp: , Rfl:    calcitRIOL (ROCALTROL) 0.25 MCG capsule, Take 0.25 mcg by mouth daily., Disp: , Rfl:    cyclobenzaprine (FLEXERIL) 10 MG tablet, Take 1 tablet (10 mg total) by mouth at bedtime., Disp: 30 tablet, Rfl: 0   dapagliflozin propanediol  (FARXIGA) 5 MG TABS tablet, Take 5 mg by mouth daily., Disp: , Rfl:    diltiazem (CARDIZEM CD) 360 MG 24 hr capsule, Take 1 capsule (360 mg total) by mouth daily., Disp: 30 capsule, Rfl: 3   Elastic Bandages & Supports (MEDICAL COMPRESSION STOCKINGS) MISC, Please provide 8mHg compression stockings, Disp: 1 each, Rfl: 0   ergocalciferol (VITAMIN D2) 1.25 MG (50000 UT) capsule, Take 50,000 Units by mouth once a week., Disp: , Rfl:    escitalopram (LEXAPRO) 20 MG tablet, Take 20 mg by mouth daily., Disp: , Rfl:    ferrous sulfate 325 (65 FE) MG tablet, Take by mouth., Disp: , Rfl:    furosemide (LASIX) 20 MG tablet, Take 20 mg by mouth daily., Disp: , Rfl:    gabapentin (NEURONTIN) 100 MG capsule, Take 100 mg by mouth 3 (three) times daily., Disp: , Rfl:    glycerin adult 2 g suppository, Place rectally., Disp: , Rfl:    hydrALAZINE (APRESOLINE) 50 MG tablet, Take 1 tablet (50 mg total) by mouth 3 (three) times daily., Disp: , Rfl:    insulin lispro (HUMALOG) 100 UNIT/ML injection, Inject 0.04 mLs (4 Units total) into the skin 3 (three) times daily with meals., Disp: 4 mL, Rfl: 0   INVEGA SUSTENNA injection, SMARTSIG:117 Milligram(s) IM Once a Month, Disp: , Rfl:    linagliptin (TRADJENTA) 5 MG TABS tablet, Take 1 tablet (5 mg total) by mouth daily., Disp: 30 tablet,  Rfl: 3   loperamide (IMODIUM) 2 MG capsule, Take by mouth., Disp: , Rfl:    losartan (COZAAR) 100 MG tablet, Take 100 mg by mouth daily., Disp: , Rfl:    Lurasidone HCl 60 MG TABS, Take by mouth., Disp: , Rfl:    melatonin 3 MG TABS tablet, Take by mouth., Disp: , Rfl:    multivitamin (RENA-VIT) TABS tablet, Take 1 tablet by mouth at bedtime., Disp: 30 tablet, Rfl: 3   nystatin (MYCOSTATIN) 100000 UNIT/ML suspension, , Disp: , Rfl:    OLANZapine (ZYPREXA) 10 MG tablet, Take 1 tablet (10 mg total) by mouth at bedtime., Disp: 30 tablet, Rfl: 3   OLANZapine (ZYPREXA) 10 MG tablet, Take by mouth., Disp: , Rfl:    ondansetron (ZOFRAN) 4  MG tablet, Take by mouth., Disp: , Rfl:    oxybutynin (DITROPAN XL) 15 MG 24 hr tablet, TAKE 1 TABLET BY MOUTH ONCE DAILY FOR BLADDER *WEAR GLOVES IF CRUSHING*, Disp: , Rfl:    polyethylene glycol powder (GLYCOLAX/MIRALAX) 17 GM/SCOOP powder, Take by mouth., Disp: , Rfl:    traMADol (ULTRAM) 50 MG tablet, Take 1 tablet (50 mg total) by mouth 2 (two) times daily as needed for moderate pain., Disp: 15 tablet, Rfl: 0   traZODone (DESYREL) 50 MG tablet, Take 50 mg by mouth at bedtime as needed for sleep., Disp: , Rfl:    acetaminophen (TYLENOL) 325 MG tablet, Take by mouth. (Patient not taking: Reported on 07/06/2022), Disp: , Rfl:    alum & mag hydroxide-simeth (MAALOX/MYLANTA) 200-200-20 MG/5ML suspension, Take by mouth. (Patient not taking: Reported on 07/06/2022), Disp: , Rfl:    Family History  Problem Relation Age of Onset   Diabetes Mother    Glaucoma Mother    Heart disease Mother    Heart attack Mother    Depression Mother    Hypertension Mother    Diabetes Father    Glaucoma Father    Hypertension Father    Prostate cancer Father    Diabetes Sister    Depression Sister    Post-traumatic stress disorder Sister      Social History   Tobacco Use   Smoking status: Every Day    Packs/day: 1.00    Types: Cigarettes    Start date: 04/21/1978   Smokeless tobacco: Never  Vaping Use   Vaping Use: Former  Substance Use Topics   Alcohol use: No    Alcohol/week: 0.0 standard drinks of alcohol   Drug use: No    Allergies as of 07/06/2022 - Review Complete 07/06/2022  Allergen Reaction Noted   Penicillins  04/22/2015    Review of Systems:    All systems reviewed and negative except where noted in HPI.   Physical Exam:  BP (!) 145/63   Pulse (!) 56   Temp 97.8 F (36.6 C) (Oral)   Ht '5\' 2"'$  (1.575 m)   Wt 127 lb 4 oz (57.7 kg)   BMI 23.27 kg/m  No LMP recorded. Patient is postmenopausal.  General:   Alert,  Well-developed, well-nourished, pleasant and cooperative in  NAD Head:  Normocephalic and atraumatic. Eyes:  Sclera clear, no icterus.   Conjunctiva pink. Ears:  Normal auditory acuity. Nose:  No deformity, discharge, or lesions. Mouth:  No deformity or lesions,oropharynx pink & moist. Neck:  Supple; no masses or thyromegaly. Lungs:  Respirations even and unlabored.  Clear throughout to auscultation.   No wheezes, crackles, or rhonchi. No acute distress. Heart:  Regular rate and rhythm; no  murmurs, clicks, rubs, or gallops. Abdomen:  Normal bowel sounds. Soft, non-tender and non-distended without masses, hepatosplenomegaly or hernias noted.  No guarding or rebound tenderness.   Rectal: Not performed Msk:  Symmetrical without gross deformities. Good, equal movement & strength bilaterally. Pulses:  Normal pulses noted. Extremities:  No clubbing or edema.  No cyanosis. Neurologic:  Alert and oriented x3;  grossly normal neurologically. Skin:  Intact without significant lesions or rashes. No jaundice. Psych:  Alert and cooperative. Normal mood and affect.  Imaging Studies: No abdominal imaging  Assessment and Plan:   Jennica S Hotz is a 61 y.o. female with history of schizophrenia, hypertension, diabetes, stage IV CKD is seen in consultation for chronic iron deficiency anemia with no evidence of active GI bleed S/p parenteral iron therapy and currently on oral iron Discussed with patient regarding upper endoscopy and colonoscopy with possible video capsule endoscopy and she is agreeable Recommend 2-day prep given history of constipation  I have discussed alternative options, risks & benefits,  which include, but are not limited to, bleeding, infection, perforation,respiratory complication & drug reaction.  The patient agrees with this plan & written consent will be obtained.      Follow up based on the above workup   Alicia Darby, MD

## 2022-07-06 NOTE — Patient Instructions (Signed)
Follow up depending on the results of the procedures

## 2022-07-16 ENCOUNTER — Encounter: Payer: Self-pay | Admitting: Internal Medicine

## 2022-07-20 ENCOUNTER — Encounter: Payer: Self-pay | Admitting: Gastroenterology

## 2022-07-21 ENCOUNTER — Encounter
Admission: RE | Disposition: A | Discharge status: Nursing Home (Includes ICF) | Payer: Self-pay | Source: Home / Self Care | Attending: Gastroenterology

## 2022-07-21 ENCOUNTER — Ambulatory Visit
Admission: RE | Admit: 2022-07-21 | Discharge: 2022-07-21 | Disposition: A | Discharge status: Other Acute Inpatient Hospital | Payer: Medicare Other | Attending: Gastroenterology | Admitting: Gastroenterology

## 2022-07-21 ENCOUNTER — Ambulatory Visit: Payer: Medicare Other | Admitting: Certified Registered"

## 2022-07-21 DIAGNOSIS — K269 Duodenal ulcer, unspecified as acute or chronic, without hemorrhage or perforation: Secondary | ICD-10-CM | POA: Insufficient documentation

## 2022-07-21 DIAGNOSIS — K259 Gastric ulcer, unspecified as acute or chronic, without hemorrhage or perforation: Secondary | ICD-10-CM

## 2022-07-21 DIAGNOSIS — K2970 Gastritis, unspecified, without bleeding: Secondary | ICD-10-CM | POA: Diagnosis not present

## 2022-07-21 DIAGNOSIS — D759 Disease of blood and blood-forming organs, unspecified: Secondary | ICD-10-CM | POA: Diagnosis not present

## 2022-07-21 DIAGNOSIS — I10 Essential (primary) hypertension: Secondary | ICD-10-CM | POA: Diagnosis not present

## 2022-07-21 DIAGNOSIS — K3189 Other diseases of stomach and duodenum: Secondary | ICD-10-CM | POA: Insufficient documentation

## 2022-07-21 DIAGNOSIS — D128 Benign neoplasm of rectum: Secondary | ICD-10-CM | POA: Insufficient documentation

## 2022-07-21 DIAGNOSIS — D124 Benign neoplasm of descending colon: Secondary | ICD-10-CM

## 2022-07-21 DIAGNOSIS — F17210 Nicotine dependence, cigarettes, uncomplicated: Secondary | ICD-10-CM | POA: Diagnosis not present

## 2022-07-21 DIAGNOSIS — K317 Polyp of stomach and duodenum: Secondary | ICD-10-CM

## 2022-07-21 DIAGNOSIS — E119 Type 2 diabetes mellitus without complications: Secondary | ICD-10-CM | POA: Diagnosis not present

## 2022-07-21 DIAGNOSIS — D649 Anemia, unspecified: Secondary | ICD-10-CM | POA: Diagnosis not present

## 2022-07-21 DIAGNOSIS — D509 Iron deficiency anemia, unspecified: Secondary | ICD-10-CM

## 2022-07-21 DIAGNOSIS — F319 Bipolar disorder, unspecified: Secondary | ICD-10-CM | POA: Insufficient documentation

## 2022-07-21 HISTORY — PX: COLONOSCOPY WITH PROPOFOL: SHX5780

## 2022-07-21 HISTORY — PX: ESOPHAGOGASTRODUODENOSCOPY (EGD) WITH PROPOFOL: SHX5813

## 2022-07-21 LAB — GLUCOSE, CAPILLARY: Glucose-Capillary: 121 mg/dL — ABNORMAL HIGH (ref 70–99)

## 2022-07-21 SURGERY — ESOPHAGOGASTRODUODENOSCOPY (EGD) WITH PROPOFOL
Anesthesia: General

## 2022-07-21 MED ORDER — PHENYLEPHRINE 80 MCG/ML (10ML) SYRINGE FOR IV PUSH (FOR BLOOD PRESSURE SUPPORT)
PREFILLED_SYRINGE | INTRAVENOUS | Status: AC
  Filled 2022-07-21: qty 10

## 2022-07-21 MED ORDER — PROPOFOL 500 MG/50ML IV EMUL
INTRAVENOUS | Status: DC | PRN
  Administered 2022-07-21: 165 ug/kg/min via INTRAVENOUS
  Administered 2022-07-21: 50 mg via INTRAVENOUS

## 2022-07-21 MED ORDER — PROPOFOL 1000 MG/100ML IV EMUL
INTRAVENOUS | Status: AC
  Filled 2022-07-21: qty 100

## 2022-07-21 MED ORDER — EPHEDRINE SULFATE (PRESSORS) 50 MG/ML IJ SOLN
INTRAMUSCULAR | Status: DC | PRN
  Administered 2022-07-21: 10 mg via INTRAVENOUS
  Administered 2022-07-21: 5 mg via INTRAVENOUS

## 2022-07-21 MED ORDER — EPHEDRINE 5 MG/ML INJ
INTRAVENOUS | Status: AC
  Filled 2022-07-21: qty 5

## 2022-07-21 MED ORDER — SODIUM CHLORIDE 0.9 % IV SOLN: INTRAVENOUS | Status: DC

## 2022-07-21 MED ORDER — PHENYLEPHRINE HCL (PRESSORS) 10 MG/ML IV SOLN
INTRAVENOUS | Status: DC | PRN
  Administered 2022-07-21: 120 ug via INTRAVENOUS
  Administered 2022-07-21: 160 ug via INTRAVENOUS

## 2022-07-21 MED ORDER — OMEPRAZOLE 40 MG PO CPDR: 40.0000 mg | DELAYED_RELEASE_CAPSULE | Freq: Every day | ORAL | 3 refills | Status: DC

## 2022-07-21 NOTE — Op Note (Signed)
[facility] Gastroenterology Patient Name: Alicia Rogers Procedure Date: 07/21/2022 9:18 AM MRN: [id number] Account #: [id number] Date of Birth: 25-Dec-1960 Admit Type: Outpatient Age: 61 Room: [facility] ENDO ROOM 4 Gender: Female Note Status: Finalized Instrument Name: Upper Endoscope [id number] Procedure:             Upper GI endoscopy Indications:           Unexplained iron deficiency anemia Providers:             Lin Landsman MD, MD Referring MD:          No Local Md, MD (Referring MD) Medicines:             General Anesthesia Complications:         No immediate complications. Estimated blood loss: None. Procedure:             Pre-Anesthesia Assessment:                        - Prior to the procedure, a History and Physical was                         performed, and patient medications and allergies were                         reviewed. The patient is competent. The risks and                         benefits of the procedure and the sedation options and                         risks were discussed with the patient. All questions                         were answered and informed consent was obtained.                         Patient identification and proposed procedure were                         verified by the physician, the nurse, the                         anesthesiologist, the anesthetist and the technician                         in the pre-procedure area in the procedure room in the                         endoscopy suite. Mental Status Examination: alert and                         oriented. Airway Examination: normal oropharyngeal                         airway and neck mobility. Respiratory Examination:                         clear to auscultation. CV Examination: normal.  Prophylactic Antibiotics: The patient does not require                         prophylactic antibiotics. Prior Anticoagulants: The                          patient has taken no anticoagulant or antiplatelet                         agents. ASA Grade Assessment: III - A patient with                         severe systemic disease. After reviewing the risks and                         benefits, the patient was deemed in satisfactory                         condition to undergo the procedure. The anesthesia                         plan was to use general anesthesia. Immediately prior                         to administration of medications, the patient was                         re-assessed for adequacy to receive sedatives. The                         heart rate, respiratory rate, oxygen saturations,                         blood pressure, adequacy of pulmonary ventilation, and                         response to care were monitored throughout the                         procedure. The physical status of the patient was                         re-assessed after the procedure.                        After obtaining informed consent, the endoscope was                         passed under direct vision. Throughout the procedure,                         the patient's blood pressure, pulse, and oxygen                         saturations were monitored continuously. The Endoscope                         was introduced through the mouth, and advanced to the  second part of duodenum. The upper GI endoscopy was                         accomplished without difficulty. The patient tolerated                         the procedure well. Findings:      A single 9 mm sessile polyp with no bleeding was found in the second       portion of the duodenum. The polyp was removed with a hot snare.       Resection and retrieval were complete. Estimated blood loss: none. To       prevent bleeding after the polypectomy, one hemostatic clip was       successfully placed (MR safe). Clip manufacturer: [employer].       There was no bleeding  during, or at the end, of the procedure. Estimated       blood loss: none.      Diffuse mild mucosal variance characterized by melanosis was found in       the entire duodenum. Biopsies were taken with a cold forceps for       histology.      Multiple dispersed diminutive erosions with stigmata of recent bleeding       were found in the gastric antrum. Biopsies were taken with a cold       forceps for Helicobacter pylori testing.      The gastric body and incisura were normal. Biopsies were taken with a       cold forceps for histology.      The cardia and gastric fundus were normal on retroflexion.      Esophagogastric landmarks were identified: the gastroesophageal junction       was found at 37 cm from the incisors.      The gastroesophageal junction and examined esophagus were normal. Impression:            - A single duodenal polyp. Resected and retrieved.                         Clip manufacturer: [employer]. Clip (MR safe)                         was placed.                        - Mucosal variant in the duodenum. Biopsied.                        - Erosive gastropathy with stigmata of recent                         bleeding. Biopsied.                        - Normal gastric body and incisura. Biopsied.                        - Esophagogastric landmarks identified.                        - Normal gastroesophageal junction and esophagus. Recommendation:        - Await pathology  results.                        - Use a proton pump inhibitor PO BID for 3 months.                        - Proceed with colonoscopy as scheduled                        See colonoscopy report Procedure Code(s):     --- Professional ---                        [id number], Esophagogastroduodenoscopy, flexible,                         transoral; with removal of tumor(s), polyp(s), or                         other lesion(s) by snare technique                        43239, 86, Esophagogastroduodenoscopy,  flexible,                         transoral; with biopsy, single or multiple Diagnosis Code(s):     --- Professional ---                        K31.7, Polyp of stomach and duodenum                        K31.89, Other diseases of stomach and duodenum                        K92.2, Gastrointestinal hemorrhage, unspecified                        D50.9, Iron deficiency anemia, unspecified CPT copyright 2022 American Medical Association. All rights reserved. The codes documented in this report are preliminary and upon coder review may  be revised to meet current compliance requirements. Dr. Ulyess Mort Lin Landsman MD, MD 07/21/2022 9:44:08 AM This report has been signed electronically. Number of Addenda: 0 Note Initiated On: 07/21/2022 9:18 AM Estimated Blood Loss:  Estimated blood loss: none.      [facility]

## 2022-07-21 NOTE — Op Note (Signed)
[facility] Gastroenterology Patient Name: Alicia Rogers Procedure Date: 07/21/2022 9:17 AM MRN: [id number] Account #: [id number] Date of Birth: [birth year] Admit Type: Outpatient Age: 61 Room: [facility] ENDO ROOM 4 Gender: Female Note Status: Finalized Instrument Name: Colonoscope [id number] Procedure:             Colonoscopy Indications:           This is the patient's first colonoscopy, Unexplained                         iron deficiency anemia Providers:             Lin Landsman MD, MD Referring MD:          No Local Md, MD (Referring MD) Medicines:             General Anesthesia Complications:         No immediate complications. Estimated blood loss: None. Procedure:             Pre-Anesthesia Assessment:                        - Prior to the procedure, a History and Physical was                         performed, and patient medications and allergies were                         reviewed. The patient is competent. The risks and                         benefits of the procedure and the sedation options and                         risks were discussed with the patient. All questions                         were answered and informed consent was obtained.                         Patient identification and proposed procedure were                         verified by the physician, the nurse, the                         anesthesiologist, the anesthetist and the technician                         in the pre-procedure area in the procedure room in the                         endoscopy suite. Mental Status Examination: alert and                         oriented. Airway Examination: normal oropharyngeal                         airway and neck mobility. Respiratory Examination:  clear to auscultation. CV Examination: normal.                         Prophylactic Antibiotics: The patient does not require                         prophylactic  antibiotics. Prior Anticoagulants: The                         patient has taken no anticoagulant or antiplatelet                         agents. ASA Grade Assessment: III - A patient with                         severe systemic disease. After reviewing the risks and                         benefits, the patient was deemed in satisfactory                         condition to undergo the procedure. The anesthesia                         plan was to use general anesthesia. Immediately prior                         to administration of medications, the patient was                         re-assessed for adequacy to receive sedatives. The                         heart rate, respiratory rate, oxygen saturations,                         blood pressure, adequacy of pulmonary ventilation, and                         response to care were monitored throughout the                         procedure. The physical status of the patient was                         re-assessed after the procedure.                        After obtaining informed consent, the colonoscope was                         passed under direct vision. Throughout the procedure,                         the patient's blood pressure, pulse, and oxygen                         saturations were monitored continuously. The  Colonoscope was introduced through the anus and                         advanced to the the terminal ileum, with                         identification of the appendiceal orifice and IC                         valve. The colonoscopy was performed without                         difficulty. The patient tolerated the procedure well.                         The quality of the bowel preparation was adequate to                         identify polyps greater than 5 mm in size. The                         terminal ileum, ileocecal valve, appendiceal orifice,                         and rectum were  photographed. Findings:      The perianal and digital rectal examinations were normal. Pertinent       negatives include normal sphincter tone and no palpable rectal lesions.      The terminal ileum appeared normal.      A 10 mm polyp was found in the descending colon. The polyp was sessile.       The polyp was removed with a hot snare. Resection and retrieval were       complete. To prevent bleeding after the polypectomy, one hemostatic clip       was successfully placed (MR safe). Clip manufacturer: [employer].       There was no bleeding during, or at the end, of the procedure.      Four sessile polyps were found in the descending colon. The polyps were       5 to 10 mm in size. These polyps were removed with a hot snare.       Resection and retrieval were complete. Estimated blood loss: none.      A 4 mm polyp was found in the distal rectum. The polyp was sessile. The       polyp was removed with a cold snare. Resection and retrieval were       complete. Estimated blood loss: none.      A 30 mm polyp was found in the distal rectum. The polyp was carpet-like,       flat and lateral spreading. Polypectomy was not attempted due to polyp       size (too large to be excised). Impression:            - The examined portion of the ileum was normal.                        - One 10 mm polyp in the descending colon, removed  with a hot snare. Resected and retrieved. Clip                         manufacturer: [employer]. Clip (MR safe) was                         placed.                        - Four 5 to 10 mm polyps in the descending colon,                         removed with a hot snare. Resected and retrieved.                        - One 4 mm polyp in the distal rectum, removed with a                         cold snare. Resected and retrieved.                        - One 30 mm polyp in the distal rectum. Resection not                          attempted. Recommendation:        - Perform a flexible sigmoidoscopy for polypectomy at                         appointment to be scheduled within next 2 9month,                         recommend miralax bowel prep.                        - Await pathology results.                        - Repeat colonoscopy in 3 years for surveillance.                        - To visualize the small bowel, perform video capsule                         endoscopy at appointment to be scheduled. Procedure Code(s):     --- Professional ---                        [id number] Colonoscopy, flexible; with removal of                         tumor(s), polyp(s), or other lesion(s) by snare                         technique Diagnosis Code(s):     --- Professional ---                        D12.4, Benign neoplasm of descending colon  D12.8, Benign neoplasm of rectum                        D50.9, Iron deficiency anemia, unspecified CPT copyright 2022 American Medical Association. All rights reserved. The codes documented in this report are preliminary and upon coder review may  be revised to meet current compliance requirements. Dr. Ulyess Mort Lin Landsman MD, MD 07/21/2022 10:23:43 AM This report has been signed electronically. Number of Addenda: 0 Note Initiated On: 07/21/2022 9:17 AM Scope Withdrawal Time: 0 hours 26 minutes 41 seconds  Total Procedure Duration: 0 hours 32 minutes 34 seconds  Estimated Blood Loss:  Estimated blood loss: none.      [facility]

## 2022-07-21 NOTE — H&P (Signed)
Cephas Darby, MD [address]  [place], [place] [address]  Main: [phone]  Fax: [phone] Pager: [phone]  Primary Care Physician:  Mechele Claude, FNP Primary Gastroenterologist:  Dr. Cephas Darby  Pre-Procedure History & Physical: HPI:  Alicia Rogers is a 61 y.o. female is here for an endoscopy and colonoscopy.   Past Medical History:  Diagnosis Date   Depression    Diabetes mellitus without complication ([place])    Diabetes mellitus, type II ([place])    Hypertension    Schizophrenia ([place])     Past Surgical History:  Procedure Laterality Date   colonscopy      Prior to Admission medications   Medication Sig Start Date End Date Taking? Authorizing Provider  aspirin EC 81 MG tablet Take 81 mg by mouth daily. 11/19/21  Yes [provider]  calcitRIOL (ROCALTROL) 0.25 MCG capsule Take 0.25 mcg by mouth daily. 01/14/22  Yes [provider]  dapagliflozin propanediol (FARXIGA) 5 MG TABS tablet Take 5 mg by mouth daily.   Yes [provider]  diltiazem (CARDIZEM CD) 360 MG 24 hr capsule Take 1 capsule (360 mg total) by mouth daily. 11/11/21  Yes Parks Ranger, DO  escitalopram (LEXAPRO) 20 MG tablet Take 20 mg by mouth daily. 05/21/22  Yes [provider]  ferrous sulfate 325 (65 FE) MG tablet Take by mouth. 12/07/21  Yes [provider]  furosemide (LASIX) 20 MG tablet Take 20 mg by mouth daily. 01/14/22  Yes [provider]  gabapentin (NEURONTIN) 100 MG capsule Take 100 mg by mouth 3 (three) times daily. 01/10/22  Yes [provider]  hydrALAZINE (APRESOLINE) 50 MG tablet Take 1 tablet (50 mg total) by mouth 3 (three) times daily. 09/29/21  Yes Danford, Suann Larry, MD  linagliptin (TRADJENTA) 5 MG TABS tablet Take 1 tablet (5 mg total) by mouth daily. 11/11/21  Yes Parks Ranger, DO  losartan (COZAAR) 100 MG tablet Take 100 mg by mouth daily. 01/12/22  Yes [provider]  multivitamin (RENA-VIT) TABS tablet Take 1 tablet by mouth at bedtime. 11/10/21  Yes Herrick, Richard Edward, DO  OLANZapine (ZYPREXA) 10 MG tablet Take by mouth.   Yes [provider]  oxybutynin (DITROPAN XL) 15 MG 24 hr tablet TAKE 1 TABLET BY MOUTH ONCE DAILY FOR BLADDER *WEAR GLOVES IF CRUSHING* 03/17/21  Yes [provider]  traMADol (ULTRAM) 50 MG tablet Take 1 tablet (50 mg total) by mouth 2 (two) times daily as needed for moderate pain. 03/16/22  Yes Harvest Dark, MD  traZODone (DESYREL) 50 MG tablet Take 50 mg by mouth at bedtime as needed for sleep. 06/18/22  Yes [provider]  acetaminophen (TYLENOL) 325 MG tablet Take by mouth. Patient not taking: Reported on 07/06/2022 11/19/21 11/19/22  [provider]  alum & mag hydroxide-simeth (MAALOX/MYLANTA) 200-200-20 MG/5ML suspension Take by mouth. Patient not taking: Reported on 07/06/2022    [provider]  cyclobenzaprine (FLEXERIL) 10 MG tablet Take 1 tablet (10 mg total) by mouth at bedtime. 11/10/21   Parks Ranger, DO  Elastic Bandages & Supports ([facility]) [place] Please provide 28mHg compression stockings 03/16/22   PHarvest Dark MD  ergocalciferol (VITAMIN D2) 1.25 MG (50000 UT) capsule Take 50,000 Units by mouth once a week.    [provider]  glycerin adult 2 g suppository Place rectally.    [provider]  insulin lispro (HUMALOG) 100 UNIT/ML injection Inject  0.04 mLs (4 Units total) into the skin 3 (three) times daily with meals. 11/10/21   Parks Ranger, DO  INVEGA SUSTENNA injection SMARTSIG:117 Milligram(s) IM Once a Month 12/29/21   [provider]  loperamide (IMODIUM) 2 MG capsule Take by mouth. 03/17/21   [provider]  Lurasidone HCl 60 MG TABS Take by mouth.    [provider]  melatonin 3 MG TABS tablet Take by mouth. 12/07/21   [provider]  nystatin  (MYCOSTATIN) 100000 UNIT/ML suspension     [provider]  OLANZapine (ZYPREXA) 10 MG tablet Take 1 tablet (10 mg total) by mouth at bedtime. 11/10/21   Parks Ranger, DO  ondansetron (ZOFRAN) 4 MG tablet Take by mouth.    [provider]  polyethylene glycol powder (GLYCOLAX/MIRALAX) 17 GM/SCOOP powder Take by mouth.    [provider]    Allergies as of 07/06/2022 - Review Complete 07/06/2022  Allergen Reaction Noted   Penicillins  04/22/2015    Family History  Problem Relation Age of Onset   Diabetes Mother    Glaucoma Mother    Heart disease Mother    Heart attack Mother    Depression Mother    Hypertension Mother    Diabetes Father    Glaucoma Father    Hypertension Father    Prostate cancer Father    Diabetes Sister    Depression Sister    Post-traumatic stress disorder Sister     Social History   Socioeconomic History   Marital status: Single    Spouse name: Not on file   Number of children: Not on file   Years of education: Not on file   Highest education level: Not on file  Occupational History   Not on file  Tobacco Use   Smoking status: Every Day    Packs/day: 1.00    Types: Cigarettes    Start date: 04/21/1978   Smokeless tobacco: Never  Vaping Use   Vaping Use: Former  Substance and Sexual Activity   Alcohol use: No    Alcohol/week: 0.0 standard drinks of alcohol   Drug use: No   Sexual activity: Not Currently  Other Topics Concern   Not on file  Social History Narrative   Not on file   Social Determinants of Health   Financial Resource Strain: Not on file  Food Insecurity: Not on file  Transportation Needs: Not on file  Physical Activity: Not on file  Stress: Not on file  Social Connections: Not on file  Intimate Partner Violence: Not on file    Review of Systems: See HPI, otherwise negative ROS  Physical Exam: BP (!) 164/49   Pulse 60   Temp (!) 96.4 F (35.8 C) (Temporal)   Resp 18   Ht 5'  2" (1.575 m)   Wt 57.2 kg   SpO2 100%   BMI 23.05 kg/m  General:   Alert,  pleasant and cooperative in NAD Head:  Normocephalic and atraumatic. Neck:  Supple; no masses or thyromegaly. Lungs:  Clear throughout to auscultation.    Heart:  Regular rate and rhythm. Abdomen:  Soft, nontender and nondistended. Normal bowel sounds, without guarding, and without rebound.   Neurologic:  Alert and  oriented x4;  grossly normal neurologically.  Impression/Plan: [facility] [place] is here for an endoscopy and colonoscopy to be performed for chronic IDA  Risks, benefits, limitations, and alternatives regarding  endoscopy and colonoscopy have been reviewed with the patient.  Questions  have been answered.  All parties agreeable.   Sherri Sear, MD  07/21/2022, 9:16 AM

## 2022-07-21 NOTE — Transfer of Care (Signed)
Immediate Anesthesia Transfer of Care Note  Patient: Alicia Rogers  Procedure(s) Performed: ESOPHAGOGASTRODUODENOSCOPY (EGD) WITH PROPOFOL COLONOSCOPY WITH PROPOFOL  Patient Location: PACU  Anesthesia Type:General  Level of Consciousness: drowsy  Airway & Oxygen Therapy: Patient Spontanous Breathing  Post-op Assessment: Report given to RN and Post -op Vital signs reviewed and stable  Post vital signs: Reviewed and stable  Last Vitals:  Vitals Value Taken Time  BP    Temp    Pulse 57 07/21/22 1023  Resp 11 07/21/22 1023  SpO2 100 % 07/21/22 1023  Vitals shown include unvalidated device data.  Last Pain:  Vitals:   07/21/22 0826  TempSrc: Temporal         Complications: No notable events documented.

## 2022-07-21 NOTE — Anesthesia Preprocedure Evaluation (Signed)
Anesthesia Evaluation  Patient identified by MRN, date of birth, ID band Patient awake    Reviewed: Allergy & Precautions, H&P , NPO status , Patient's Chart, lab work & pertinent test results, reviewed documented beta blocker date and time   Airway Mallampati: II   Neck ROM: full    Dental  (+) Poor Dentition   Pulmonary pneumonia, Current Smoker   Pulmonary exam normal        Cardiovascular Exercise Tolerance: Poor hypertension, On Medications negative cardio ROS Normal cardiovascular exam Rhythm:regular Rate:Normal     Neuro/Psych  PSYCHIATRIC DISORDERS  Depression Bipolar Disorder Schizophrenia  negative neurological ROS     GI/Hepatic negative GI ROS, Neg liver ROS,,,  Endo/Other  negative endocrine ROSdiabetes, Well Controlled    Renal/GU Renal disease  negative genitourinary   Musculoskeletal   Abdominal   Peds  Hematology  (+) Blood dyscrasia, anemia   Anesthesia Other Findings Past Medical History: No date: Depression No date: Diabetes mellitus without complication (HCC) No date: Diabetes mellitus, type II (HCC) No date: Hypertension No date: Schizophrenia [facility]) Past Surgical History: No date: colonscopy BMI    Body Mass Index: 23.05 kg/m     Reproductive/Obstetrics negative OB ROS                             Anesthesia Physical Anesthesia Plan  ASA: 3  Anesthesia Plan: General   Post-op Pain Management:    Induction:   PONV Risk Score and Plan:   Airway Management Planned:   Additional Equipment:   Intra-op Plan:   Post-operative Plan:   Informed Consent: I have reviewed the patients History and Physical, chart, labs and discussed the procedure including the risks, benefits and alternatives for the proposed anesthesia with the patient or authorized representative who has indicated his/her understanding and acceptance.     Dental Advisory Given  Plan  Discussed with: CRNA  Anesthesia Plan Comments:        Anesthesia Quick Evaluation

## 2022-07-22 ENCOUNTER — Ambulatory Visit (INDEPENDENT_AMBULATORY_CARE_PROVIDER_SITE_OTHER): Payer: Medicare Other | Admitting: Podiatry

## 2022-07-22 ENCOUNTER — Encounter: Payer: Self-pay | Admitting: Podiatry

## 2022-07-22 DIAGNOSIS — M79675 Pain in left toe(s): Secondary | ICD-10-CM

## 2022-07-22 DIAGNOSIS — B351 Tinea unguium: Secondary | ICD-10-CM

## 2022-07-22 DIAGNOSIS — N184 Chronic kidney disease, stage 4 (severe): Secondary | ICD-10-CM

## 2022-07-22 DIAGNOSIS — M79674 Pain in right toe(s): Secondary | ICD-10-CM

## 2022-07-22 DIAGNOSIS — E1122 Type 2 diabetes mellitus with diabetic chronic kidney disease: Secondary | ICD-10-CM

## 2022-07-22 LAB — SURGICAL PATHOLOGY

## 2022-07-22 NOTE — Progress Notes (Signed)
This patient returns to my office for at risk foot care.  This patient requires this care by a professional since this patient will be at risk due to having diabetes and CKD.  This patient is unable to cut nails herself since the patient cannot reach her nails.These nails are painful walking and wearing shoes.  This patient presents for at risk foot care today.  General Appearance  Alert, conversant and in no acute stress.  Vascular  Dorsalis pedis and posterior tibial  pulses are palpable  bilaterally.  Capillary return is within normal limits  bilaterally. Temperature is within normal limits  bilaterally.  Neurologic  Senn-Weinstein monofilament wire test within normal limits  bilaterally. Muscle power within normal limits bilaterally.  Nails Thick disfigured discolored nails with subungual debris  from hallux to fifth toes bilaterally. No evidence of bacterial infection or drainage bilaterally.  Orthopedic  No limitations of motion  feet .  No crepitus or effusions noted.  No bony pathology or digital deformities noted.  Skin  normotropic skin with no porokeratosis noted bilaterally.  No signs of infections or ulcers noted.     Onychomycosis  Pain in right toes  Pain in left toes  Consent was obtained for treatment procedures.   Mechanical debridement of nails 1-5  bilaterally performed with a nail nipper.  Filed with dremel without incident. Nail polish was on her nails so dremel tool was used sparingly.   Return office visit     3 mos                Told patient to return for periodic foot care and evaluation due to potential at risk complications.   Gardiner Barefoot DPM

## 2022-07-23 ENCOUNTER — Encounter: Payer: Self-pay | Admitting: Gastroenterology

## 2022-07-30 NOTE — Anesthesia Postprocedure Evaluation (Signed)
Anesthesia Post Note  Patient: Alicia Rogers  Procedure(s) Performed: ESOPHAGOGASTRODUODENOSCOPY (EGD) WITH PROPOFOL COLONOSCOPY WITH PROPOFOL  Patient location during evaluation: PACU Anesthesia Type: General Level of consciousness: awake and alert Pain management: pain level controlled Vital Signs Assessment: post-procedure vital signs reviewed and stable Respiratory status: spontaneous breathing, nonlabored ventilation, respiratory function stable and patient connected to nasal cannula oxygen Cardiovascular status: blood pressure returned to baseline and stable Postop Assessment: no apparent nausea or vomiting Anesthetic complications: no   No notable events documented.   Last Vitals:  Vitals:   07/21/22 1033 07/21/22 1043  BP: (!) 100/52 (!) 129/55  Pulse: (!) 51 (!) 52  Resp: 14 12  Temp:    SpO2: 100% 100%    Last Pain:  Vitals:   07/22/22 0737  TempSrc:   PainSc: 0-No pain                 Molli Barrows

## 2022-08-03 ENCOUNTER — Other Ambulatory Visit: Payer: Self-pay

## 2022-08-03 DIAGNOSIS — D649 Anemia, unspecified: Secondary | ICD-10-CM

## 2022-08-03 DIAGNOSIS — D472 Monoclonal gammopathy: Secondary | ICD-10-CM

## 2022-08-04 ENCOUNTER — Inpatient Hospital Stay: Payer: 59

## 2022-08-04 ENCOUNTER — Encounter: Payer: Self-pay | Admitting: Internal Medicine

## 2022-08-04 ENCOUNTER — Inpatient Hospital Stay (HOSPITAL_BASED_OUTPATIENT_CLINIC_OR_DEPARTMENT_OTHER): Payer: 59 | Admitting: Internal Medicine

## 2022-08-04 ENCOUNTER — Inpatient Hospital Stay: Payer: 59 | Attending: Internal Medicine

## 2022-08-04 VITALS — BP 145/82 | HR 62 | Temp 94.5°F | Resp 16 | Wt 125.4 lb

## 2022-08-04 DIAGNOSIS — D631 Anemia in chronic kidney disease: Secondary | ICD-10-CM | POA: Insufficient documentation

## 2022-08-04 DIAGNOSIS — N184 Chronic kidney disease, stage 4 (severe): Secondary | ICD-10-CM | POA: Insufficient documentation

## 2022-08-04 DIAGNOSIS — D472 Monoclonal gammopathy: Secondary | ICD-10-CM

## 2022-08-04 DIAGNOSIS — D649 Anemia, unspecified: Secondary | ICD-10-CM

## 2022-08-04 DIAGNOSIS — N1832 Chronic kidney disease, stage 3b: Secondary | ICD-10-CM

## 2022-08-04 LAB — CBC WITH DIFFERENTIAL/PLATELET
Abs Immature Granulocytes: 0.04 10*3/uL (ref 0.00–0.07)
Basophils Absolute: 0 10*3/uL (ref 0.0–0.1)
Basophils Relative: 0 %
Eosinophils Absolute: 0.1 10*3/uL (ref 0.0–0.5)
Eosinophils Relative: 1 %
HCT: 31.4 % — ABNORMAL LOW (ref 36.0–46.0)
Hemoglobin: 9.6 g/dL — ABNORMAL LOW (ref 12.0–15.0)
Immature Granulocytes: 0 %
Lymphocytes Relative: 17 %
Lymphs Abs: 1.6 10*3/uL (ref 0.7–4.0)
MCH: 28.7 pg (ref 26.0–34.0)
MCHC: 30.6 g/dL (ref 30.0–36.0)
MCV: 94 fL (ref 80.0–100.0)
Monocytes Absolute: 0.8 10*3/uL (ref 0.1–1.0)
Monocytes Relative: 9 %
Neutro Abs: 6.5 10*3/uL (ref 1.7–7.7)
Neutrophils Relative %: 73 %
Platelets: 338 10*3/uL (ref 150–400)
RBC: 3.34 MIL/uL — ABNORMAL LOW (ref 3.87–5.11)
RDW: 12.9 % (ref 11.5–15.5)
WBC: 9.1 10*3/uL (ref 4.0–10.5)
nRBC: 0 % (ref 0.0–0.2)

## 2022-08-04 LAB — IRON AND TIBC
Iron: 45 ug/dL (ref 28–170)
Saturation Ratios: 20 % (ref 10.4–31.8)
TIBC: 221 ug/dL — ABNORMAL LOW (ref 250–450)
UIBC: 176 ug/dL

## 2022-08-04 LAB — COMPREHENSIVE METABOLIC PANEL
ALT: 18 U/L (ref 0–44)
AST: 18 U/L (ref 15–41)
Albumin: 3.4 g/dL — ABNORMAL LOW (ref 3.5–5.0)
Alkaline Phosphatase: 87 U/L (ref 38–126)
Anion gap: 10 (ref 5–15)
BUN: 48 mg/dL — ABNORMAL HIGH (ref 8–23)
CO2: 24 mmol/L (ref 22–32)
Calcium: 9.1 mg/dL (ref 8.9–10.3)
Chloride: 104 mmol/L (ref 98–111)
Creatinine, Ser: 3.12 mg/dL — ABNORMAL HIGH (ref 0.44–1.00)
GFR, Estimated: 16 mL/min — ABNORMAL LOW (ref >60)
Glucose, Bld: 132 mg/dL — ABNORMAL HIGH (ref 70–99)
Potassium: 4.1 mmol/L (ref 3.5–5.1)
Sodium: 138 mmol/L (ref 135–145)
Total Bilirubin: 0.3 mg/dL (ref 0.3–1.2)
Total Protein: 7.7 g/dL (ref 6.5–8.1)

## 2022-08-04 LAB — FERRITIN: Ferritin: 229 ng/mL (ref 11–307)

## 2022-08-04 MED ORDER — SODIUM CHLORIDE 0.9 % IV SOLN
Freq: Once | INTRAVENOUS | Status: AC
  Filled 2022-08-04: qty 250

## 2022-08-04 MED ORDER — SODIUM CHLORIDE 0.9 % IV SOLN
200.0000 mg | Freq: Once | INTRAVENOUS | Status: AC
  Administered 2022-08-04: 200 mg via INTRAVENOUS
  Filled 2022-08-04: qty 200

## 2022-08-04 NOTE — Assessment & Plan Note (Addendum)
#   Anemia-8- 9-[JUNE 2023]secondary to CKD- ferritin-12; iron sat 16%. S/p  IV iron infusion today-  # Etiology of Anemia: likely CKD;  December 2023. Dr.Vanga- [colo/EGD- dec 2023] awaiting flexible sigmoidoscopy for polypectomy - To visualize the small bowel, perform video capsule endoscopy at appointment to be scheduled.  # #July 2023 -repeat M protein -negative ; kappa lambda light chain ratio 2.63.  Suspect secondary to CKD.   # Chronic kidney disease-IV / ? DM vs other- -followed by Dr.Korrapati- GFR 16- defer to nephrology. Refer to nutrition re: renal diet.   # DISPOSITION:  # Refer to nutrition re: renal diet.  # venofer today; no retacrit # follow up in 3 months- MD; labs- cbc/cbmp; iron studies;ferritin- ;possible IV iron infusion OR retacrit- -Dr.B

## 2022-08-04 NOTE — Patient Instructions (Signed)

## 2022-08-04 NOTE — Progress Notes (Signed)
Patient denies new problems/concerns today.   °

## 2022-08-04 NOTE — Progress Notes (Addendum)
[facility] NOTE  Patient Care Team: Mechele Claude, FNP as PCP - General (Family Medicine) Cammie Sickle, MD as Consulting Physician (Oncology)  CHIEF COMPLAINTS/PURPOSE OF CONSULTATION: Monoclonal gammopathy   #  JULY 2023-Anemia-8- 9-secondary to CKD/iron deficiency.[colo-2014] Plan IV iron infusion today- ferritin-12; iron sat 16%.   IV iron infusion.    A poorly-defined band of restricted protein mobility is detected       in the gamma globulins. It is unlikely that this may represent a       monoclonal protein; however, immunofixation analysis is available       if clinically indicated.  150.7 High      Free Lambda Lt Chains, S 64.0 High     Free Kappa/Lambda Ratio 2.35 High      Component    Free Kappa Lt Chains, S  Free Lambda Lt Chains, S  Free Kappa/Lambda Ratio    # CKD stage-IV Dr.Korrapati]; schizoaffective disorder [Psyche;?] ; DM- on insulin.   HISTORY OF PRESENTING ILLNESS: Alone.   Ambulating independently. Poor historian.   Alicia Rogers 62 y.o.  female with schizoaffective disorder with CKD; anemia; possible monoclonal gammopathy is here for follow-up.   In the interim patient underwent EGD/colonoscopy.  Patient denies new problems/concerns today. Chronic joint pains however no new onset of bone pain.  Chronic mild fatigue.   Denies any nausea vomiting.  Complains of chronic leg swelling.  Not any worse.  Review of Systems  Constitutional:  Negative for chills, diaphoresis, fever, malaise/fatigue and weight loss.  HENT:  Negative for nosebleeds and sore throat.   Eyes:  Negative for double vision.  Respiratory:  Negative for cough, hemoptysis, sputum production, shortness of breath and wheezing.   Cardiovascular:  Positive for leg swelling. Negative for chest pain, palpitations and orthopnea.  Gastrointestinal:  Negative for abdominal pain, blood in stool, constipation, diarrhea, heartburn, melena, nausea and  vomiting.  Genitourinary:  Negative for dysuria, frequency and urgency.  Musculoskeletal:  Positive for back pain and joint pain.  Skin: Negative.  Negative for itching and rash.  Neurological:  Negative for dizziness, tingling, focal weakness, weakness and headaches.  Endo/Heme/Allergies:  Does not bruise/bleed easily.  Psychiatric/Behavioral:  Negative for depression. The patient is not nervous/anxious and does not have insomnia.     MEDICAL HISTORY:  Past Medical History:  Diagnosis Date   Depression    Diabetes mellitus without complication ([place])    Diabetes mellitus, type II ([place])    Hypertension    Schizophrenia ([place])     SURGICAL HISTORY: Past Surgical History:  Procedure Laterality Date   COLONOSCOPY WITH PROPOFOL N/A 07/21/2022   Procedure: COLONOSCOPY WITH PROPOFOL;  Surgeon: Lin Landsman, MD;  Location: [facility] ENDOSCOPY;  Service: Gastroenterology;  Laterality: N/A;   colonscopy     ESOPHAGOGASTRODUODENOSCOPY (EGD) WITH PROPOFOL N/A 07/21/2022   Procedure: ESOPHAGOGASTRODUODENOSCOPY (EGD) WITH PROPOFOL;  Surgeon: Lin Landsman, MD;  Location: [facility]COPY;  Service: Gastroenterology;  Laterality: N/A;    SOCIAL HISTORY: Social History   Socioeconomic History   Marital status: Single    Spouse name: Not on file   Number of children: Not on file   Years of education: Not on file   Highest education level: Not on file  Occupational History   Not on file  Tobacco Use   Smoking status: Every Day    Packs/day: 1.00    Types: Cigarettes    Start date: 04/21/1978   Smokeless tobacco: Never  Vaping Use   Vaping Use: Former  Substance and Sexual Activity   Alcohol use: No    Alcohol/week: 0.0 standard drinks of alcohol   Drug use: No   Sexual activity: Not Currently  Other Topics Concern   Not on file  Social History Narrative   Not on file   Social Determinants of Health   Financial Resource Strain: Not on file  Food Insecurity: Not on file   Transportation Needs: Not on file  Physical Activity: Not on file  Stress: Not on file  Social Connections: Not on file  Intimate Partner Violence: Not on file    FAMILY HISTORY: Family History  Problem Relation Age of Onset   Diabetes Mother    Glaucoma Mother    Heart disease Mother    Heart attack Mother    Depression Mother    Hypertension Mother    Diabetes Father    Glaucoma Father    Hypertension Father    Prostate cancer Father    Diabetes Sister    Depression Sister    Post-traumatic stress disorder Sister     ALLERGIES:  is allergic to penicillins.  MEDICATIONS:  Current Outpatient Medications  Medication Sig Dispense Refill   aspirin EC 81 MG tablet Take 81 mg by mouth daily.     calcitRIOL (ROCALTROL) 0.25 MCG capsule Take 0.25 mcg by mouth daily.     cyclobenzaprine (FLEXERIL) 10 MG tablet Take 1 tablet (10 mg total) by mouth at bedtime. 30 tablet 0   dapagliflozin propanediol (FARXIGA) 5 MG TABS tablet Take 5 mg by mouth daily.     diltiazem (CARDIZEM CD) 360 MG 24 hr capsule Take 1 capsule (360 mg total) by mouth daily. 30 capsule 3   ergocalciferol (VITAMIN D2) 1.25 MG (50000 UT) capsule Take 50,000 Units by mouth once a week.     escitalopram (LEXAPRO) 20 MG tablet Take 20 mg by mouth daily.     ferrous sulfate 325 (65 FE) MG tablet Take by mouth.     furosemide (LASIX) 20 MG tablet Take 20 mg by mouth daily. 1/2 tab qd     gabapentin (NEURONTIN) 100 MG capsule Take 100 mg by mouth 3 (three) times daily.     hydrALAZINE (APRESOLINE) 50 MG tablet Take 1 tablet (50 mg total) by mouth 3 (three) times daily.     INVEGA SUSTENNA injection SMARTSIG:117 Milligram(s) IM Once a Month     linagliptin (TRADJENTA) 5 MG TABS tablet Take 1 tablet (5 mg total) by mouth daily. 30 tablet 3   loperamide (IMODIUM) 2 MG capsule Take by mouth.     losartan (COZAAR) 100 MG tablet Take 100 mg by mouth daily.     melatonin 3 MG TABS tablet Take by mouth.     multivitamin  (RENA-VIT) TABS tablet Take 1 tablet by mouth at bedtime. 30 tablet 3   oxybutynin (DITROPAN XL) 15 MG 24 hr tablet TAKE 1 TABLET BY MOUTH ONCE DAILY FOR BLADDER *WEAR GLOVES IF CRUSHING*     traMADol (ULTRAM) 50 MG tablet Take 1 tablet (50 mg total) by mouth 2 (two) times daily as needed for moderate pain. 15 tablet 0   traZODone (DESYREL) 50 MG tablet Take 50 mg by mouth at bedtime as needed for sleep.     Elastic Bandages & Supports (MEDICAL COMPRESSION STOCKINGS) MISC Please provide 23mHg compression stockings (Patient not taking: Reported on 08/04/2022) 1 each 0   glycerin adult 2 g suppository Place rectally. (Patient not  taking: Reported on 08/04/2022)     insulin lispro (HUMALOG) 100 UNIT/ML injection Inject 0.04 mLs (4 Units total) into the skin 3 (three) times daily with meals. (Patient not taking: Reported on 08/04/2022) 4 mL 0   Lurasidone HCl 60 MG TABS Take by mouth. (Patient not taking: Reported on 08/04/2022)     nystatin (MYCOSTATIN) 100000 UNIT/ML suspension  (Patient not taking: Reported on 08/04/2022)     OLANZapine (ZYPREXA) 10 MG tablet Take 1 tablet (10 mg total) by mouth at bedtime. (Patient not taking: Reported on 08/04/2022) 30 tablet 3   OLANZapine (ZYPREXA) 10 MG tablet Take by mouth. (Patient not taking: Reported on 08/04/2022)     omeprazole (PRILOSEC) 40 MG capsule Take 1 capsule (40 mg total) by mouth daily before breakfast. (Patient not taking: Reported on 08/04/2022) 30 capsule 3   ondansetron (ZOFRAN) 4 MG tablet Take by mouth. (Patient not taking: Reported on 08/04/2022)     polyethylene glycol powder (GLYCOLAX/MIRALAX) 17 GM/SCOOP powder Take by mouth.     No current facility-administered medications for this visit.      PHYSICAL EXAMINATION:   Vitals:   08/04/22 1300  BP: (!) 145/82  Pulse: 62  Resp: 16  Temp: (!) 94.5 F (34.7 C)   Filed Weights   08/04/22 1300  Weight: 125 lb 6.4 oz (56.9 kg)    Physical Exam Vitals and nursing note reviewed.  HENT:      Head: Normocephalic and atraumatic.     Mouth/Throat:     Pharynx: Oropharynx is clear.  Eyes:     Extraocular Movements: Extraocular movements intact.     Pupils: Pupils are equal, round, and reactive to light.  Cardiovascular:     Rate and Rhythm: Normal rate and regular rhythm.  Pulmonary:     Comments: Decreased breath sounds bilaterally.  Abdominal:     Palpations: Abdomen is soft.  Musculoskeletal:        General: Normal range of motion.     Cervical back: Normal range of motion.  Skin:    General: Skin is warm.  Neurological:     General: No focal deficit present.     Mental Status: She is alert and oriented to person, place, and time.  Psychiatric:        Behavior: Behavior normal.        Judgment: Judgment normal.     LABORATORY DATA:  I have reviewed the data as listed Lab Results  Component Value Date   WBC 9.1 08/04/2022   HGB 9.6 (L) 08/04/2022   HCT 31.4 (L) 08/04/2022   MCV 94.0 08/04/2022   PLT 338 08/04/2022   Recent Labs    03/15/22 2012 05/05/22 1312 08/04/22 1249  NA 138 139 138  K 5.1 4.3 4.1  CL 107 109 104  CO2 '23 24 24  '$ GLUCOSE 104* 157* 132*  BUN 56* 44* 48*  CREATININE 3.49* 3.05* 3.12*  CALCIUM 9.9 9.7 9.1  GFRNONAA 14* 17* 16*  PROT 7.6 7.5 7.7  ALBUMIN 3.3* 3.1* 3.4*  AST '18 20 18  '$ ALT '18 20 18  '$ ALKPHOS 65 86 87  BILITOT 0.5 0.1* 0.3     No results found.  Lab Results  Component Value Date   KPAFRELGTCHN 163.8 (H) 02/05/2022   LAMBDASER 62.2 (H) 02/05/2022   KAPLAMBRATIO 2.63 (H) 02/05/2022     Symptomatic anemia # Anemia-8- 9-[JUNE 2023]secondary to CKD- ferritin-12; iron sat 16%. S/p  IV iron infusion today-  # Etiology of Anemia:  likely CKD;  December 2023. Dr.Vanga- [colo/EGD- dec 2023] awaiting flexible sigmoidoscopy for polypectomy - To visualize the small bowel, perform video capsule endoscopy at appointment to be scheduled.  # #July 2023 -repeat M protein -negative ; kappa lambda light chain ratio 2.63.   Suspect secondary to CKD.   # Chronic kidney disease-IV / ? DM vs other- -followed by Dr.Korrapati- GFR 16- defer to nephrology. Refer to nutrition re: renal diet.   # DISPOSITION:  # Refer to nutrition re: renal diet.  # venofer today; no retacrit # follow up in 3 months- MD; labs- cbc/cbmp; iron studies;ferritin- ;possible IV iron infusion OR retacrit- -Dr.B    All questions were answered. The patient knows to call the clinic with any problems, questions or concerns.      Cammie Sickle, MD 08/04/2022 4:37 PM

## 2022-08-13 ENCOUNTER — Encounter: Payer: Self-pay | Admitting: Internal Medicine

## 2022-08-17 ENCOUNTER — Inpatient Hospital Stay: Payer: 59

## 2022-08-17 NOTE — Progress Notes (Signed)
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  Past medical history of CKD stage 4 per nephrology, anemia, DM, HTN, schizoaffective.  Met with patient and caregiver Angie in clinic.  Patient lives at family care home.  All meals are prepared for her 3 times a day and snacks are available in between meals.  Patient and Angie report that appetite is good, "eating like a horse." Patient asking for seconds at times.    [facility] has low sodium options for patient.    Medications: calcitriol, tradjenta, renavit, Vit D, lasix, fe sulfate   Labs: 08/04/22 glucose 132, BUN 48, creatinine 3.12, K 4.1 (normal)   Anthropometrics:   Height: 62 inches Weight: 124 lb 6 oz today in clinic 12/5 127 lb (swelling) 7/14 118 lb BMI: 22  2% weight loss in the last month, (fluid loss)  NUTRITION DIAGNOSIS: Food and nutrition related knowledge deficit related to kidney health as evidenced by requesting kidney friendly diet information   INTERVENTION:  Educated patient and caregiver on low sodium diet. Low Sodium Diet Therapy handout provided to patient from [facility]. Educated patient on Plate Method for Diabetes.  Handout provided  Encouraged limiting dark sodas and switching to clear diet/sugar free options Discussed low sodium snacks for patient Contact information provided   Next Visit: no follow-up RD available if needed  Shamina Etheridge B. Zenia Resides, [place], [place] Registered Dietitian [phone]

## 2022-08-30 ENCOUNTER — Emergency Department: Payer: 59

## 2022-08-30 ENCOUNTER — Inpatient Hospital Stay
Admission: EM | Admit: 2022-08-30 | Discharge: 2022-09-01 | DRG: 194 | Disposition: A | Discharge status: Home / Self Care | Payer: 59 | Attending: Student | Admitting: Student

## 2022-08-30 DIAGNOSIS — Z8042 Family history of malignant neoplasm of prostate: Secondary | ICD-10-CM

## 2022-08-30 DIAGNOSIS — Z880 Allergy status to penicillin: Secondary | ICD-10-CM

## 2022-08-30 DIAGNOSIS — F251 Schizoaffective disorder, depressive type: Secondary | ICD-10-CM

## 2022-08-30 DIAGNOSIS — Z1152 Encounter for screening for COVID-19: Secondary | ICD-10-CM

## 2022-08-30 DIAGNOSIS — F17210 Nicotine dependence, cigarettes, uncomplicated: Secondary | ICD-10-CM | POA: Diagnosis present

## 2022-08-30 DIAGNOSIS — J9601 Acute respiratory failure with hypoxia: Secondary | ICD-10-CM | POA: Insufficient documentation

## 2022-08-30 DIAGNOSIS — E041 Nontoxic single thyroid nodule: Secondary | ICD-10-CM | POA: Diagnosis present

## 2022-08-30 DIAGNOSIS — R0603 Acute respiratory distress: Secondary | ICD-10-CM | POA: Diagnosis not present

## 2022-08-30 DIAGNOSIS — R911 Solitary pulmonary nodule: Secondary | ICD-10-CM | POA: Diagnosis not present

## 2022-08-30 DIAGNOSIS — Z833 Family history of diabetes mellitus: Secondary | ICD-10-CM

## 2022-08-30 DIAGNOSIS — E872 Acidosis, unspecified: Secondary | ICD-10-CM | POA: Diagnosis present

## 2022-08-30 DIAGNOSIS — E1122 Type 2 diabetes mellitus with diabetic chronic kidney disease: Secondary | ICD-10-CM | POA: Diagnosis not present

## 2022-08-30 DIAGNOSIS — Z818 Family history of other mental and behavioral disorders: Secondary | ICD-10-CM

## 2022-08-30 DIAGNOSIS — Z7982 Long term (current) use of aspirin: Secondary | ICD-10-CM

## 2022-08-30 DIAGNOSIS — N184 Chronic kidney disease, stage 4 (severe): Secondary | ICD-10-CM

## 2022-08-30 DIAGNOSIS — J121 Respiratory syncytial virus pneumonia: Secondary | ICD-10-CM | POA: Diagnosis not present

## 2022-08-30 DIAGNOSIS — Z79899 Other long term (current) drug therapy: Secondary | ICD-10-CM

## 2022-08-30 DIAGNOSIS — Z83511 Family history of glaucoma: Secondary | ICD-10-CM

## 2022-08-30 DIAGNOSIS — D631 Anemia in chronic kidney disease: Secondary | ICD-10-CM | POA: Diagnosis present

## 2022-08-30 DIAGNOSIS — R0902 Hypoxemia: Secondary | ICD-10-CM

## 2022-08-30 DIAGNOSIS — D509 Iron deficiency anemia, unspecified: Secondary | ICD-10-CM | POA: Diagnosis present

## 2022-08-30 DIAGNOSIS — D472 Monoclonal gammopathy: Secondary | ICD-10-CM | POA: Diagnosis present

## 2022-08-30 DIAGNOSIS — Z794 Long term (current) use of insulin: Secondary | ICD-10-CM

## 2022-08-30 DIAGNOSIS — Z8249 Family history of ischemic heart disease and other diseases of the circulatory system: Secondary | ICD-10-CM

## 2022-08-30 DIAGNOSIS — I129 Hypertensive chronic kidney disease with stage 1 through stage 4 chronic kidney disease, or unspecified chronic kidney disease: Secondary | ICD-10-CM | POA: Diagnosis present

## 2022-08-30 DIAGNOSIS — J189 Pneumonia, unspecified organism: Secondary | ICD-10-CM

## 2022-08-30 DIAGNOSIS — J210 Acute bronchiolitis due to respiratory syncytial virus: Principal | ICD-10-CM

## 2022-08-30 LAB — CBC WITH DIFFERENTIAL/PLATELET
Abs Immature Granulocytes: 0.12 10*3/uL — ABNORMAL HIGH (ref 0.00–0.07)
Basophils Absolute: 0 10*3/uL (ref 0.0–0.1)
Basophils Relative: 0 %
Eosinophils Absolute: 0.1 10*3/uL (ref 0.0–0.5)
Eosinophils Relative: 0 %
HCT: 29.3 % — ABNORMAL LOW (ref 36.0–46.0)
Hemoglobin: 8.7 g/dL — ABNORMAL LOW (ref 12.0–15.0)
Immature Granulocytes: 1 %
Lymphocytes Relative: 15 %
Lymphs Abs: 2.6 10*3/uL (ref 0.7–4.0)
MCH: 28.2 pg (ref 26.0–34.0)
MCHC: 29.7 g/dL — ABNORMAL LOW (ref 30.0–36.0)
MCV: 94.8 fL (ref 80.0–100.0)
Monocytes Absolute: 1.6 10*3/uL — ABNORMAL HIGH (ref 0.1–1.0)
Monocytes Relative: 9 %
Neutro Abs: 12.7 10*3/uL — ABNORMAL HIGH (ref 1.7–7.7)
Neutrophils Relative %: 75 %
Platelets: 283 10*3/uL (ref 150–400)
RBC: 3.09 MIL/uL — ABNORMAL LOW (ref 3.87–5.11)
RDW: 13.4 % (ref 11.5–15.5)
WBC: 17.1 10*3/uL — ABNORMAL HIGH (ref 4.0–10.5)
nRBC: 0.1 % (ref 0.0–0.2)

## 2022-08-30 LAB — HEMOGLOBIN A1C
Hgb A1c MFr Bld: 6.2 % — ABNORMAL HIGH (ref 4.8–5.6)
Mean Plasma Glucose: 131.24 mg/dL

## 2022-08-30 LAB — GLUCOSE, CAPILLARY: Glucose-Capillary: 134 mg/dL — ABNORMAL HIGH (ref 70–99)

## 2022-08-30 LAB — COMPREHENSIVE METABOLIC PANEL
ALT: 61 U/L — ABNORMAL HIGH (ref 0–44)
AST: 78 U/L — ABNORMAL HIGH (ref 15–41)
Albumin: 3.1 g/dL — ABNORMAL LOW (ref 3.5–5.0)
Alkaline Phosphatase: 103 U/L (ref 38–126)
Anion gap: 11 (ref 5–15)
BUN: 55 mg/dL — ABNORMAL HIGH (ref 8–23)
CO2: 19 mmol/L — ABNORMAL LOW (ref 22–32)
Calcium: 9.4 mg/dL (ref 8.9–10.3)
Chloride: 104 mmol/L (ref 98–111)
Creatinine, Ser: 3.54 mg/dL — ABNORMAL HIGH (ref 0.44–1.00)
GFR, Estimated: 14 mL/min — ABNORMAL LOW (ref >60)
Glucose, Bld: 126 mg/dL — ABNORMAL HIGH (ref 70–99)
Potassium: 5 mmol/L (ref 3.5–5.1)
Sodium: 134 mmol/L — ABNORMAL LOW (ref 135–145)
Total Bilirubin: 0.9 mg/dL (ref 0.3–1.2)
Total Protein: 7.7 g/dL (ref 6.5–8.1)

## 2022-08-30 LAB — PROCALCITONIN: Procalcitonin: 0.87 ng/mL

## 2022-08-30 LAB — RESP PANEL BY RT-PCR (RSV, FLU A&B, COVID)  RVPGX2
Influenza A by PCR: NEGATIVE
Influenza B by PCR: NEGATIVE
Resp Syncytial Virus by PCR: POSITIVE — AB
SARS Coronavirus 2 by RT PCR: NEGATIVE

## 2022-08-30 MED ORDER — IPRATROPIUM-ALBUTEROL 0.5-2.5 (3) MG/3ML IN SOLN
3.0000 mL | Freq: Four times a day (QID) | RESPIRATORY_TRACT | Status: DC
  Administered 2022-08-30 – 2022-08-31 (×4): 3 mL via RESPIRATORY_TRACT
  Filled 2022-08-30 (×4): qty 3

## 2022-08-30 MED ORDER — IPRATROPIUM-ALBUTEROL 0.5-2.5 (3) MG/3ML IN SOLN
3.0000 mL | Freq: Once | RESPIRATORY_TRACT | Status: AC
  Administered 2022-08-30: 3 mL via RESPIRATORY_TRACT
  Filled 2022-08-30: qty 3

## 2022-08-30 MED ORDER — SODIUM CHLORIDE 0.9% FLUSH
3.0000 mL | Freq: Two times a day (BID) | INTRAVENOUS | Status: DC
  Administered 2022-08-30 – 2022-09-01 (×4): 3 mL via INTRAVENOUS

## 2022-08-30 MED ORDER — METHYLPREDNISOLONE SODIUM SUCC 40 MG IJ SOLR
40.0000 mg | Freq: Once | INTRAMUSCULAR | Status: AC
  Administered 2022-08-30: 40 mg via INTRAVENOUS
  Filled 2022-08-30: qty 1

## 2022-08-30 MED ORDER — LEVOFLOXACIN IN D5W 500 MG/100ML IV SOLN
500.0000 mg | Freq: Once | INTRAVENOUS | Status: AC
  Administered 2022-08-30: 500 mg via INTRAVENOUS
  Filled 2022-08-30: qty 100

## 2022-08-30 MED ORDER — PREDNISONE 20 MG PO TABS
40.0000 mg | ORAL_TABLET | Freq: Every day | ORAL | Status: DC
  Administered 2022-08-31 – 2022-09-01 (×2): 40 mg via ORAL
  Filled 2022-08-30 (×2): qty 2

## 2022-08-30 MED ORDER — INSULIN ASPART 100 UNIT/ML IJ SOLN
0.0000 [IU] | Freq: Three times a day (TID) | INTRAMUSCULAR | Status: DC
  Administered 2022-08-31 (×2): 3 [IU] via SUBCUTANEOUS
  Administered 2022-08-31: 2 [IU] via SUBCUTANEOUS
  Administered 2022-09-01: 5 [IU] via SUBCUTANEOUS
  Filled 2022-08-30 (×4): qty 1

## 2022-08-30 MED ORDER — ENOXAPARIN SODIUM 30 MG/0.3ML IJ SOSY
30.0000 mg | PREFILLED_SYRINGE | INTRAMUSCULAR | Status: DC
  Administered 2022-08-30 – 2022-08-31 (×2): 30 mg via SUBCUTANEOUS
  Filled 2022-08-30 (×2): qty 0.3

## 2022-08-30 NOTE — Assessment & Plan Note (Signed)
CT chest with 2.7 x 2.4 cm area of solid and subsolid nodule in the right upper lobe in addition to an additional smaller nodule inferior.   - Outpatient follow-up with pulmonology for rule out of neoplasm

## 2022-08-30 NOTE — ED Provider Notes (Addendum)
[facility] Provider Note    Event Date/Time   First MD Initiated Contact with Patient 08/30/22 1308     (approximate)   History   Cough   HPI  Alicia Rogers is a 62 y.o. female with history of diabetes, schizophrenia, hypertension, ckd presents emergency department from a group home.  Patient started with a cough yesterday.  No fever or chills.  No shortness of breath.      Physical Exam   Triage Vital Signs: ED Triage Vitals [08/30/22 1240]  Enc Vitals Group     BP (!) 143/56     Pulse Rate 73     Resp 18     Temp 98.6 F (37 C)     Temp Source Oral     SpO2 93 %     Weight      Height      Head Circumference      Peak Flow      Pain Score      Pain Loc      Pain Edu?      Excl. in [place]?     Most recent vital signs: Vitals:   08/30/22 1502 08/30/22 1653  BP: (!) 120/57 (!) 162/69  Pulse: 79 77  Resp: 20 20  Temp: 97.9 F (36.6 C) 97.9 F (36.6 C)  SpO2: 92% 93%     General: Awake, no distress.   CV:  Good peripheral perfusion. regular rate and  rhythm Resp:  Normal effort. Lungs cta Abd:  No distention.   Other:  No swelling of the lower extremities   ED Results / Procedures / Treatments   Labs (all labs ordered are listed, but only abnormal results are displayed) Labs Reviewed  RESP PANEL BY RT-PCR (RSV, FLU A&B, COVID)  RVPGX2 - Abnormal; Notable for the following components:      Result Value   Resp Syncytial Virus by PCR POSITIVE (*)    All other components within normal limits  CBC WITH DIFFERENTIAL/PLATELET - Abnormal; Notable for the following components:   WBC 17.1 (*)    RBC 3.09 (*)    Hemoglobin 8.7 (*)    HCT 29.3 (*)    MCHC 29.7 (*)    Neutro Abs 12.7 (*)    Monocytes Absolute 1.6 (*)    Abs Immature Granulocytes 0.12 (*)    All other components within normal limits  COMPREHENSIVE METABOLIC PANEL - Abnormal; Notable for the following components:   Sodium 134 (*)    CO2 19 (*)    Glucose,  Bld 126 (*)    BUN 55 (*)    Creatinine, Ser 3.54 (*)    Albumin 3.1 (*)    AST 78 (*)    ALT 61 (*)    GFR, Estimated 14 (*)    All other components within normal limits  PROCALCITONIN     EKG     RADIOLOGY X-ray    PROCEDURES:   .Critical Care E&M  Performed by: Versie Starks, PA-C Critical care provider statement:    Critical care time (minutes):  60   Critical care time was exclusive of:  Separately billable procedures and treating other patients   Critical care was necessary to treat or prevent imminent or life-threatening deterioration of the following conditions:  Respiratory failure   Critical care was time spent personally by me on the following activities:  Blood draw for specimens, development of treatment plan with patient or surrogate, evaluation of  patient's response to treatment, examination of patient, obtaining history from patient or surrogate, ordering and performing treatments and interventions, ordering and review of laboratory studies, ordering and review of radiographic studies, pulse oximetry, review of old charts and re-evaluation of patient's condition   I assumed direction of critical care for this patient from another provider in my specialty: no     Care discussed with: admitting provider   After initial E/M assessment, critical care services were subsequently performed that were exclusive of separately billable procedures or treatment.      MEDICATIONS ORDERED IN ED: Medications  levofloxacin (LEVAQUIN) IVPB 500 mg (has no administration in time range)  ipratropium-albuterol (DUONEB) 0.5-2.5 (3) MG/3ML nebulizer solution 3 mL (3 mLs Nebulization Given 08/30/22 1628)     IMPRESSION / MDM / ASSESSMENT AND PLAN / ED COURSE  I reviewed the triage vital signs and the nursing notes.                              Differential diagnosis includes, but is not limited to, COVID, influenza, RSV, CAP, acute bronchitis  Patient's presentation is  most consistent with acute complicated illness / injury requiring diagnostic workup.   Chest x-ray and respiratory panel  Chest x-ray shows a pleural effusion on the left side, this is independently reviewed interpreted by me  Respiratory panel is positive for RSV  Patient's oxygen is dropped to 89 to 90% on room air, will give a DuoNeb, nursing staff was instructed to do IV get labs so we can do a CT of her chest.  I feel patient most likely will be admitted as she is high risk. Placed on 2.5L nasal cannula and 02 is now around 92-93%  CBC is concerning for external WBC is 17 Metabolic panel shows continued renal disease.  GFR is below 14s will not be able to use contrast on the CT  CT of the chest without contrast  Clinical Course as of 08/30/22 1859  Mon Aug 30, 2022  1802 Procalcitonin - Baseline [SF]    Clinical Course User Index [SF] Versie Starks, PA-C   CT of the chest without contrast was independently reviewed interpreted by me as being positive for pneumonia.  Does have a thyroid nodule which will need to be assessed.  Due to the pneumonia and the patient's penicillin allergy did do IV Levaquin.  Consult hospitalist for admission.   Dr. Charleen Rogers to admit the patient.  She is aware of the new O2 requirement.  Patient appears to be in stable condition at this time  FINAL CLINICAL IMPRESSION(S) / ED DIAGNOSES   Final diagnoses:  RSV (acute bronchiolitis due to respiratory syncytial virus)  Acute respiratory distress  Hypoxia  Community acquired pneumonia, unspecified laterality     Rx / DC Orders   ED Discharge Orders     None        Note:  This document was prepared using Dragon voice recognition software and may include unintentional dictation errors.    Versie Starks, PA-C 08/30/22 [name], PA-C 08/30/22 1859    Harvest Dark, MD 08/30/22 2057

## 2022-08-30 NOTE — Assessment & Plan Note (Signed)
>>  ASSESSMENT AND PLAN FOR CKD (CHRONIC KIDNEY DISEASE), STAGE IV ([place]) WRITTEN ON 08/30/2022  7:35 PM BY Jose Persia, MD  Renal function has gradually declined over the last 1 year with GFR ranging between 14-17 over the last 8 months.  GFR currently within this range.  -Monitor renal function while admitted

## 2022-08-30 NOTE — H&P (Signed)
History and Physical    Patient: Alicia Rogers DOB: 1960-08-09 DOA: 08/30/2022 DOS: the patient was seen and examined on 08/30/2022 PCP: Mechele Claude, FNP  Patient coming from:  Group Home  Chief Complaint:  Chief Complaint  Patient presents with   Cough   HPI: Alicia Rogers is a 62 y.o. 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  Ms. Sidle states that for the past 3 days, she has been experiencing a productive cough that is worse than usual in addition to shortness of breath.  She denies any fever, chills, chest pain, nausea, vomiting.  She notes that she does have a chronic cough and has been smoking daily since she was 17.  She denies any prior diagnosis of COPD or emphysema as far she knows.  ED course: On arrival to the ED, patient was normotensive at 120/57 with heart rate of 79.  She was saturating at 92% on room air but subsequently desaturated to 88% requiring 2.5 L of supplemental oxygen to improved to 92%.  Workup remarkable for WBC of 17.1, hemoglobin of 8.7, bicarb of 19, BUN 55, creatinine 3.54, AST 78, ALT 61 and GFR 14.  RSV PCR positive.  CT chest was obtained that showed multifocal pneumonia in addition to a right upper lobe nodule.  Due to pneumonia with new oxygen requirement, [facility] contacted for admission.  Review of Systems: As mentioned in the history of present illness. All other systems reviewed and are negative. Past Medical History:  Diagnosis Date   Depression    Diabetes mellitus without complication ([place])    Diabetes mellitus, type II ([place])    Hypertension    Schizophrenia ([place])    Past Surgical History:  Procedure Laterality Date   COLONOSCOPY WITH PROPOFOL N/A 07/21/2022   Procedure: COLONOSCOPY WITH PROPOFOL;   Surgeon: Lin Landsman, MD;  Location: [facility] ENDOSCOPY;  Service: Gastroenterology;  Laterality: N/A;   colonscopy     ESOPHAGOGASTRODUODENOSCOPY (EGD) WITH PROPOFOL N/A 07/21/2022   Procedure: ESOPHAGOGASTRODUODENOSCOPY (EGD) WITH PROPOFOL;  Surgeon: Lin Landsman, MD;  Location: [facility] ENDOSCOPY;  Service: Gastroenterology;  Laterality: N/A;   Social History:  reports that she has been smoking cigarettes. She started smoking about 44 years ago. She has been smoking an average of 1 pack per day. She has never used smokeless tobacco. She reports that she does not drink alcohol and does not use drugs.  Allergies  Allergen Reactions   Penicillins     Rash    Family History  Problem Relation Age of Onset   Diabetes Mother    Glaucoma Mother    Heart disease Mother    Heart attack Mother    Depression Mother    Hypertension Mother    Diabetes Father    Glaucoma Father    Hypertension Father    Prostate cancer Father    Diabetes Sister    Depression Sister    Post-traumatic stress disorder Sister     Prior to Admission medications   Medication Sig Start Date End Date Taking? Authorizing Provider  aspirin EC 81 MG tablet Take 81 mg by mouth daily. 11/19/21   [provider]  calcitRIOL (ROCALTROL) 0.25 MCG capsule Take 0.25 mcg by mouth daily. 01/14/22   [provider]  cyclobenzaprine (FLEXERIL) 10 MG tablet  Take 1 tablet (10 mg total) by mouth at bedtime. 11/10/21   Parks Ranger, DO  dapagliflozin propanediol (FARXIGA) 5 MG TABS tablet Take 5 mg by mouth daily.    [provider]  diltiazem (CARDIZEM CD) 360 MG 24 hr capsule Take 1 capsule (360 mg total) by mouth daily. 11/11/21   Parks Ranger, DO  Elastic Bandages & Supports ([place]) [place] Please provide 45mHg compression stockings Patient not taking: Reported on 08/04/2022 03/16/22   PHarvest Dark MD  ergocalciferol (VITAMIN D2) 1.25 MG (50000 UT)  capsule Take 50,000 Units by mouth once a week.    [provider]  escitalopram (LEXAPRO) 20 MG tablet Take 20 mg by mouth daily. 05/21/22   [provider]  ferrous sulfate 325 (65 FE) MG tablet Take by mouth. 12/07/21   [provider]  furosemide (LASIX) 20 MG tablet Take 20 mg by mouth daily. 1/2 tab qd 01/14/22   [provider]  gabapentin (NEURONTIN) 100 MG capsule Take 100 mg by mouth 3 (three) times daily. 01/10/22   [provider]  glycerin adult 2 g suppository Place rectally. Patient not taking: Reported on 08/04/2022    [provider]  hydrALAZINE (APRESOLINE) 50 MG tablet Take 1 tablet (50 mg total) by mouth 3 (three) times daily. 09/29/21   Danford, CSuann Larry MD  insulin lispro (HUMALOG) 100 UNIT/ML injection Inject 0.04 mLs (4 Units total) into the skin 3 (three) times daily with meals. Patient not taking: Reported on 08/04/2022 11/10/21   HParks Ranger DO  INVEGA SUSTENNA injection SMARTSIG:117 Milligram(s) IM Once a Month 12/29/21   [provider]  linagliptin (TRADJENTA) 5 MG TABS tablet Take 1 tablet (5 mg total) by mouth daily. 11/11/21   HParks Ranger DO  loperamide (IMODIUM) 2 MG capsule Take by mouth. 03/17/21   [provider]  losartan (COZAAR) 100 MG tablet Take 100 mg by mouth daily. 01/12/22   [provider]  Lurasidone HCl 60 MG TABS Take by mouth. Patient not taking: Reported on 08/04/2022    [provider]  melatonin 3 MG TABS tablet Take by mouth. 12/07/21   [provider]  multivitamin (RENA-VIT) TABS tablet Take 1 tablet by mouth at bedtime. 11/10/21   HParks Ranger DO  nystatin (MYCOSTATIN) 100000 UNIT/ML suspension     [provider]  OLANZapine (ZYPREXA) 10 MG tablet Take 1 tablet (10 mg total) by mouth at bedtime. Patient not taking: Reported on 08/04/2022 11/10/21   HParks Ranger DO  OLANZapine (ZYPREXA) 10 MG  tablet Take by mouth. Patient not taking: Reported on 08/04/2022    [provider]  omeprazole (PRILOSEC) 40 MG capsule Take 1 capsule (40 mg total) by mouth daily before breakfast. Patient not taking: Reported on 08/04/2022 07/21/22 11/18/22  VLin Landsman MD  ondansetron (ZOFRAN) 4 MG tablet Take by mouth. Patient not taking: Reported on 08/04/2022    [provider]  oxybutynin (DITROPAN XL) 15 MG 24 hr tablet TAKE 1 TABLET BY MOUTH ONCE DAILY FOR BLADDER *WEAR GLOVES IF CRUSHING* 03/17/21   [provider]  polyethylene glycol powder (GLYCOLAX/MIRALAX) 17 GM/SCOOP powder Take by mouth.    [provider]  traMADol (ULTRAM) 50 MG tablet Take 1 tablet (50 mg total) by mouth 2 (two) times daily as needed for moderate pain. 03/16/22   PHarvest Dark MD  traZODone (DESYREL) 50 MG tablet Take 50 mg by mouth at bedtime as needed  for sleep. 06/18/22   [provider]    Physical Exam: Vitals:   08/30/22 1502 08/30/22 1653 08/30/22 1912 08/30/22 1913  BP: (!) 120/57 (!) 162/69  (!) 146/58  Pulse: 79 77  74  Resp: '20 20  20  '$ Temp: 97.9 F (36.6 C) 97.9 F (36.6 C) 98.5 F (36.9 C)   TempSrc: Oral Oral Oral   SpO2: 92% 93%  95%   Physical Exam Vitals and nursing note reviewed.  Constitutional:      General: She is not in acute distress.    Appearance: She is normal weight. She is not toxic-appearing.  HENT:     Head: Normocephalic and atraumatic.     Mouth/Throat:     Mouth: Mucous membranes are dry.     Pharynx: Oropharynx is clear.  Eyes:     Conjunctiva/sclera: Conjunctivae normal.     Pupils: Pupils are equal, round, and reactive to light.  Cardiovascular:     Rate and Rhythm: Normal rate and regular rhythm.     Heart sounds: No murmur heard.    No gallop.  Pulmonary:     Effort: Tachypnea and accessory muscle usage (Mild) present.     Breath sounds: Decreased breath sounds (Decreased breath sounds throughout) and rales (Left  lower base only) present. No wheezing or rhonchi.  Abdominal:     General: Bowel sounds are normal. There is no distension.     Palpations: Abdomen is soft.  Musculoskeletal:     Right lower leg: No edema.     Left lower leg: No edema.  Skin:    General: Skin is warm and dry.  Neurological:     Mental Status: She is alert and oriented to person, place, and time. Mental status is at baseline.  Psychiatric:        Mood and Affect: Affect is blunt.    Data Reviewed: CBC with WBC of 17.1, hemoglobin of 8.7, MCV of 94.8, platelets of 283 CMP with sodium of 134, potassium of 5.0 (hemolysis present), bicarb of 19, BUN of 55, creatinine of 3.54, anion gap 11, albumin 3.1, AST of 78 and ALT of 61, GFR 14. RSV PCR positive.  COVID-19 and influenza PCR negative.  CT CHEST WO CONTRAST  Result Date: 08/30/2022 CLINICAL DATA:  62 year old female with pleural effusion and suspected malignancy. History of MGUSz. EXAM: CT CHEST WITHOUT CONTRAST TECHNIQUE: Multidetector CT imaging of the chest was performed following the standard protocol without IV contrast. RADIATION DOSE REDUCTION: This exam was performed according to the departmental dose-optimization program which includes automated exposure control, adjustment of the mA and/or kV according to patient size and/or use of iterative reconstruction technique. COMPARISON:  February 28, 2020 FINDINGS: Cardiovascular: Calcified aortic atherosclerotic changes. Smooth contour of the aorta. Normal caliber of the thoracic aorta. Mild cardiomegaly without pericardial effusion or nodularity. Normal caliber of central pulmonary vessels. Mediastinum/Nodes: Mildly patulous esophagus. No adenopathy in the thoracic inlet. No signs of axillary lymphadenopathy. No mediastinal adenopathy with top-normal size lymph nodes in the chest particularly in the AP window measuring 9 mm (image 46/2) no gross hilar adenopathy on noncontrast imaging. 15 mm RIGHT thyroid nodule. Lungs/Pleura:  Multifocal subsolid areas of nodularity throughout the chest. Lungs were clear on previous imaging from 2021 with no prior imaging of the more recent time point for comparison lung apices clear in October of 2022. Dominant area with solid and subsolid characteristics measures 2.7 x 2.4 cm more dense component centrally measuring up to 13 mm.  This is in the RIGHT upper lobe anteriorly. Subsolid nodule in the RIGHT inferior upper lobe (image 60/4) 12 mm. Patchy areas of ground-glass at the lung bases. Airways are patent with respect to large airways. There is some material in lower lobe bronchi. No pneumothorax. No pleural effusion. Signs of moderate pulmonary emphysema that is worse towards the lung apices. Upper Abdomen: Smooth contour the visualized liver. LEFT lateral adrenal adenoma 2.5 x 1.7 cm density less than 0 Hounsfield units. Expansion of the LEFT adrenal gland in a generalized fashion associated with the body of the LEFT adrenal measuring 2.2 x 1.6 cm, stable compared to previous imaging. Stable nodularity of the RIGHT adrenal gland also with low attenuation compatible with adrenal adenoma. Musculoskeletal: No chest wall mass. No destructive bone finding or acute bone process. IMPRESSION: 1. Multifocal areas of ground-glass and subsolid nodules throughout the chest most suggestive of infectious or inflammatory etiology but not present on prior imaging. Findings would favor if infectious atypical infection or perhaps even a component of aspiration at the lung bases. 2. Close follow-up will be helpful to exclude the possibility of neoplasm particularly in the anterior RIGHT upper lobe. Ultimately if there are respiratory symptoms pulmonology consultation is advised. Consider follow-up in 8-12 weeks following therapy. 3. 15 mm RIGHT thyroid nodule. Recommend thyroid ultrasound if not yet performed on follow-up. 4. Bilateral adrenal adenomas. 5. Aortic atherosclerosis. Aortic Atherosclerosis (ICD10-I70.0).  Electronically Signed   By: Zetta Bills M.D.   On: 08/30/2022 18:28   DG Chest 2 View  Result Date: 08/30/2022 CLINICAL DATA:  Cough beginning this morning EXAM: CHEST - 2 VIEW COMPARISON:  09/27/2021 FINDINGS: Enlargement of cardiac silhouette. Mediastinal contours and pulmonary vascularity normal. Atherosclerotic calcification aorta. Mild RIGHT basilar atelectasis. Atelectasis versus infiltrate at LEFT base with small LEFT pleural effusion blunting the costophrenic angle. Upper lungs clear. No pneumothorax or acute osseous findings. IMPRESSION: Enlargement of cardiac silhouette with RIGHT basilar atelectasis. Atelectasis versus consolidation LEFT lung base with small LEFT pleural effusion. Electronically Signed   By: Lavonia Dana M.D.   On: 08/30/2022 13:59    Results are pending, will review when available.  Assessment and Plan:  * Pneumonia due to respiratory syncytial virus (RSV) RSV PCR positive with multifocal pneumonia seen on CT chest.  No known history of COPD, however patient is an active long-time smoker with evidence of moderate emphysema on CT chest.  - Continue supplemental oxygen to maintain oxygen saturation above 88% - Wean as tolerated - Procalcitonin pending - S/p Levaquin in the ED.  Will hold off on further antimicrobials until procalcitonin results - DuoNebs every 6 hours - S/p Solu-Medrol 40 mg once.  Start prednisone 40 mg daily tomorrow to complete a 5-day course  Lung nodule CT chest with 2.7 x 2.4 cm area of solid and subsolid nodule in the right upper lobe in addition to an additional smaller nodule inferior.   - Outpatient follow-up with pulmonology for rule out of neoplasm  Type 2 diabetes mellitus with diabetic chronic kidney disease (HCC) - A1c pending - SSI, moderate - Hold home anti-glycemic agents  CKD (chronic kidney disease), stage IV ([place]) Renal function has gradually declined over the last 1 year with GFR ranging between 14-17 over the last 8  months.  GFR currently within this range.  -Monitor renal function while admitted  Schizoaffective disorder, depressive type ([place]) Although affect is blunted, patient is appropriate.  - Continue home medications  Advance Care Planning:   Code Status: Full  Code  Consults: None  Family Communication: Will contact patient's sister to update, per patient's request.   Severity of Illness: The appropriate patient status for this patient is OBSERVATION. Observation status is judged to be reasonable and necessary in order to provide the required intensity of service to ensure the patient's safety. The patient's presenting symptoms, physical exam findings, and initial radiographic and laboratory data in the context of their medical condition is felt to place them at decreased risk for further clinical deterioration. Furthermore, it is anticipated that the patient will be medically stable for discharge from the hospital within 2 midnights of admission.   Author: Jose Persia, MD 08/30/2022 7:36 PM  For on call review [email or web address].

## 2022-08-30 NOTE — ED Notes (Addendum)
Call the group home when she is ready to leave [phone] (angie)

## 2022-08-30 NOTE — ED Triage Notes (Signed)
Presents with care giver for cough

## 2022-08-30 NOTE — ED Triage Notes (Signed)
Pt presents to the ED via POV due to a cough that started this morning. Pt has no other complaints.

## 2022-08-30 NOTE — Progress Notes (Signed)
Attempted to update patient's sister [name], per patient's request. No answer and voice mail box is full.   Signed,  Dr. Jose Persia [facility]  To contact, please use AMION. [facility], use secure chat.   08/30/2022, 10:34 PM

## 2022-08-30 NOTE — Assessment & Plan Note (Signed)
Although affect is blunted, patient is appropriate.  - Continue home medications

## 2022-08-30 NOTE — Assessment & Plan Note (Signed)
Renal function has gradually declined over the last 1 year with GFR ranging between 14-17 over the last 8 months.  GFR currently within this range.  -Monitor renal function while admitted

## 2022-08-30 NOTE — Assessment & Plan Note (Addendum)
RSV PCR positive with multifocal pneumonia seen on CT chest.  No known history of COPD, however patient is an active long-time smoker with evidence of moderate emphysema on CT chest.  - Continue supplemental oxygen to maintain oxygen saturation above 88% - Wean as tolerated - Procalcitonin pending - S/p Levaquin in the ED.  Will hold off on further antimicrobials until procalcitonin results - DuoNebs every 6 hours - S/p Solu-Medrol 40 mg once.  Start prednisone 40 mg daily tomorrow to complete a 5-day course

## 2022-08-30 NOTE — Assessment & Plan Note (Signed)
-  A1c pending - SSI, moderate - Hold home anti-glycemic agents

## 2022-08-31 ENCOUNTER — Other Ambulatory Visit: Payer: Self-pay

## 2022-08-31 ENCOUNTER — Encounter: Payer: Self-pay | Admitting: Internal Medicine

## 2022-08-31 DIAGNOSIS — D631 Anemia in chronic kidney disease: Secondary | ICD-10-CM | POA: Diagnosis present

## 2022-08-31 DIAGNOSIS — R0603 Acute respiratory distress: Secondary | ICD-10-CM | POA: Diagnosis present

## 2022-08-31 DIAGNOSIS — Z79899 Other long term (current) drug therapy: Secondary | ICD-10-CM | POA: Diagnosis not present

## 2022-08-31 DIAGNOSIS — I129 Hypertensive chronic kidney disease with stage 1 through stage 4 chronic kidney disease, or unspecified chronic kidney disease: Secondary | ICD-10-CM | POA: Diagnosis present

## 2022-08-31 DIAGNOSIS — D509 Iron deficiency anemia, unspecified: Secondary | ICD-10-CM | POA: Diagnosis present

## 2022-08-31 DIAGNOSIS — Z8249 Family history of ischemic heart disease and other diseases of the circulatory system: Secondary | ICD-10-CM | POA: Diagnosis not present

## 2022-08-31 DIAGNOSIS — F17210 Nicotine dependence, cigarettes, uncomplicated: Secondary | ICD-10-CM | POA: Diagnosis present

## 2022-08-31 DIAGNOSIS — Z7982 Long term (current) use of aspirin: Secondary | ICD-10-CM | POA: Diagnosis not present

## 2022-08-31 DIAGNOSIS — Z83511 Family history of glaucoma: Secondary | ICD-10-CM | POA: Diagnosis not present

## 2022-08-31 DIAGNOSIS — Z880 Allergy status to penicillin: Secondary | ICD-10-CM | POA: Diagnosis not present

## 2022-08-31 DIAGNOSIS — J121 Respiratory syncytial virus pneumonia: Secondary | ICD-10-CM | POA: Diagnosis present

## 2022-08-31 DIAGNOSIS — J9601 Acute respiratory failure with hypoxia: Secondary | ICD-10-CM | POA: Diagnosis not present

## 2022-08-31 DIAGNOSIS — Z1152 Encounter for screening for COVID-19: Secondary | ICD-10-CM | POA: Diagnosis not present

## 2022-08-31 DIAGNOSIS — E872 Acidosis, unspecified: Secondary | ICD-10-CM | POA: Diagnosis present

## 2022-08-31 DIAGNOSIS — N184 Chronic kidney disease, stage 4 (severe): Secondary | ICD-10-CM | POA: Diagnosis present

## 2022-08-31 DIAGNOSIS — Z8042 Family history of malignant neoplasm of prostate: Secondary | ICD-10-CM | POA: Diagnosis not present

## 2022-08-31 DIAGNOSIS — Z833 Family history of diabetes mellitus: Secondary | ICD-10-CM | POA: Diagnosis not present

## 2022-08-31 DIAGNOSIS — D472 Monoclonal gammopathy: Secondary | ICD-10-CM | POA: Diagnosis present

## 2022-08-31 DIAGNOSIS — E041 Nontoxic single thyroid nodule: Secondary | ICD-10-CM | POA: Diagnosis present

## 2022-08-31 DIAGNOSIS — R911 Solitary pulmonary nodule: Secondary | ICD-10-CM | POA: Diagnosis present

## 2022-08-31 DIAGNOSIS — Z794 Long term (current) use of insulin: Secondary | ICD-10-CM | POA: Diagnosis not present

## 2022-08-31 DIAGNOSIS — F251 Schizoaffective disorder, depressive type: Secondary | ICD-10-CM | POA: Diagnosis present

## 2022-08-31 DIAGNOSIS — Z818 Family history of other mental and behavioral disorders: Secondary | ICD-10-CM | POA: Diagnosis not present

## 2022-08-31 DIAGNOSIS — E1122 Type 2 diabetes mellitus with diabetic chronic kidney disease: Secondary | ICD-10-CM | POA: Diagnosis present

## 2022-08-31 LAB — BASIC METABOLIC PANEL
Anion gap: 10 (ref 5–15)
BUN: 59 mg/dL — ABNORMAL HIGH (ref 8–23)
CO2: 18 mmol/L — ABNORMAL LOW (ref 22–32)
Calcium: 9.2 mg/dL (ref 8.9–10.3)
Chloride: 109 mmol/L (ref 98–111)
Creatinine, Ser: 3.67 mg/dL — ABNORMAL HIGH (ref 0.44–1.00)
GFR, Estimated: 13 mL/min — ABNORMAL LOW (ref >60)
Glucose, Bld: 189 mg/dL — ABNORMAL HIGH (ref 70–99)
Potassium: 4.9 mmol/L (ref 3.5–5.1)
Sodium: 137 mmol/L (ref 135–145)

## 2022-08-31 LAB — GLUCOSE, CAPILLARY
Glucose-Capillary: 159 mg/dL — ABNORMAL HIGH (ref 70–99)
Glucose-Capillary: 127 mg/dL — ABNORMAL HIGH (ref 70–99)
Glucose-Capillary: 164 mg/dL — ABNORMAL HIGH (ref 70–99)
Glucose-Capillary: 134 mg/dL — ABNORMAL HIGH (ref 70–99)

## 2022-08-31 LAB — CBC WITH DIFFERENTIAL/PLATELET
Abs Immature Granulocytes: 0.16 10*3/uL — ABNORMAL HIGH (ref 0.00–0.07)
Basophils Absolute: 0 10*3/uL (ref 0.0–0.1)
Basophils Relative: 0 %
Eosinophils Absolute: 0 10*3/uL (ref 0.0–0.5)
Eosinophils Relative: 0 %
HCT: 30.3 % — ABNORMAL LOW (ref 36.0–46.0)
Hemoglobin: 9.2 g/dL — ABNORMAL LOW (ref 12.0–15.0)
Immature Granulocytes: 1 %
Lymphocytes Relative: 6 %
Lymphs Abs: 1.2 10*3/uL (ref 0.7–4.0)
MCH: 28.4 pg (ref 26.0–34.0)
MCHC: 30.4 g/dL (ref 30.0–36.0)
MCV: 93.5 fL (ref 80.0–100.0)
Monocytes Absolute: 0.2 10*3/uL (ref 0.1–1.0)
Monocytes Relative: 1 %
Neutro Abs: 16.5 10*3/uL — ABNORMAL HIGH (ref 1.7–7.7)
Neutrophils Relative %: 92 %
Platelets: 281 10*3/uL (ref 150–400)
RBC: 3.24 MIL/uL — ABNORMAL LOW (ref 3.87–5.11)
RDW: 13 % (ref 11.5–15.5)
Smear Review: NORMAL
WBC: 18 10*3/uL — ABNORMAL HIGH (ref 4.0–10.5)
nRBC: 0 % (ref 0.0–0.2)

## 2022-08-31 LAB — HIV ANTIBODY (ROUTINE TESTING W REFLEX): HIV Screen 4th Generation wRfx: NONREACTIVE

## 2022-08-31 MED ORDER — MELATONIN 5 MG PO TABS
5.0000 mg | ORAL_TABLET | Freq: Every day | ORAL | Status: DC
  Administered 2022-08-31: 5 mg via ORAL
  Filled 2022-08-31: qty 1

## 2022-08-31 MED ORDER — DAPAGLIFLOZIN PROPANEDIOL 5 MG PO TABS
5.0000 mg | ORAL_TABLET | Freq: Every day | ORAL | Status: DC
  Administered 2022-08-31 – 2022-09-01 (×2): 5 mg via ORAL
  Filled 2022-08-31 (×2): qty 1

## 2022-08-31 MED ORDER — ORAL CARE MOUTH RINSE: 15.0000 mL | OROMUCOSAL | Status: DC | PRN

## 2022-08-31 MED ORDER — OXYBUTYNIN CHLORIDE ER 5 MG PO TB24
5.0000 mg | ORAL_TABLET | Freq: Every day | ORAL | Status: DC
  Administered 2022-08-31: 5 mg via ORAL
  Filled 2022-08-31: qty 1

## 2022-08-31 MED ORDER — POLYETHYLENE GLYCOL 3350 17 GM/SCOOP PO POWD
17.0000 g | Freq: Every day | ORAL | Status: DC
  Filled 2022-08-31: qty 255

## 2022-08-31 MED ORDER — CALCITRIOL 0.25 MCG PO CAPS
0.2500 ug | ORAL_CAPSULE | Freq: Every day | ORAL | Status: DC
  Administered 2022-08-31 – 2022-09-01 (×2): 0.25 ug via ORAL
  Filled 2022-08-31 (×2): qty 1

## 2022-08-31 MED ORDER — TRAMADOL HCL 50 MG PO TABS
50.0000 mg | ORAL_TABLET | Freq: Two times a day (BID) | ORAL | Status: DC | PRN
  Administered 2022-09-01: 50 mg via ORAL
  Filled 2022-08-31: qty 1

## 2022-08-31 MED ORDER — AZITHROMYCIN 250 MG PO TABS
500.0000 mg | ORAL_TABLET | Freq: Every day | ORAL | Status: DC
  Administered 2022-08-31 – 2022-09-01 (×2): 500 mg via ORAL
  Filled 2022-08-31 (×2): qty 2

## 2022-08-31 MED ORDER — DILTIAZEM HCL ER COATED BEADS 120 MG PO CP24
360.0000 mg | ORAL_CAPSULE | Freq: Every day | ORAL | Status: DC
  Administered 2022-08-31 – 2022-09-01 (×2): 360 mg via ORAL
  Filled 2022-08-31 (×3): qty 3

## 2022-08-31 MED ORDER — ADULT MULTIVITAMIN W/MINERALS CH
1.0000 | ORAL_TABLET | Freq: Every day | ORAL | Status: DC
  Administered 2022-09-01: 1 via ORAL
  Filled 2022-08-31: qty 1

## 2022-08-31 MED ORDER — IPRATROPIUM-ALBUTEROL 0.5-2.5 (3) MG/3ML IN SOLN: 3.0000 mL | Freq: Four times a day (QID) | RESPIRATORY_TRACT | Status: DC | PRN

## 2022-08-31 MED ORDER — ASPIRIN 81 MG PO TBEC
81.0000 mg | DELAYED_RELEASE_TABLET | Freq: Every day | ORAL | Status: DC
  Administered 2022-08-31 – 2022-09-01 (×2): 81 mg via ORAL
  Filled 2022-08-31 (×3): qty 1

## 2022-08-31 MED ORDER — SODIUM CHLORIDE 0.9 % IV SOLN
1.0000 g | INTRAVENOUS | Status: DC
  Administered 2022-08-31 – 2022-09-01 (×2): 1 g via INTRAVENOUS
  Filled 2022-08-31: qty 10
  Filled 2022-08-31: qty 1

## 2022-08-31 MED ORDER — CYCLOBENZAPRINE HCL 10 MG PO TABS
10.0000 mg | ORAL_TABLET | Freq: Every day | ORAL | Status: DC
  Administered 2022-08-31: 10 mg via ORAL
  Filled 2022-08-31: qty 1

## 2022-08-31 MED ORDER — PANTOPRAZOLE SODIUM 40 MG PO TBEC
40.0000 mg | DELAYED_RELEASE_TABLET | Freq: Every day | ORAL | Status: DC
  Administered 2022-08-31 – 2022-09-01 (×2): 40 mg via ORAL
  Filled 2022-08-31 (×2): qty 1

## 2022-08-31 MED ORDER — HYDRALAZINE HCL 50 MG PO TABS
50.0000 mg | ORAL_TABLET | Freq: Three times a day (TID) | ORAL | Status: DC
  Administered 2022-08-31 – 2022-09-01 (×3): 50 mg via ORAL
  Filled 2022-08-31 (×3): qty 1

## 2022-08-31 MED ORDER — LOPERAMIDE HCL 2 MG PO CAPS: 2.0000 mg | ORAL_CAPSULE | Freq: Two times a day (BID) | ORAL | Status: DC | PRN

## 2022-08-31 MED ORDER — ESCITALOPRAM OXALATE 10 MG PO TABS
20.0000 mg | ORAL_TABLET | Freq: Every day | ORAL | Status: DC
  Administered 2022-08-31 – 2022-09-01 (×2): 20 mg via ORAL
  Filled 2022-08-31 (×3): qty 2

## 2022-08-31 MED ORDER — GLUCERNA SHAKE PO LIQD
237.0000 mL | Freq: Three times a day (TID) | ORAL | Status: DC
  Administered 2022-08-31 – 2022-09-01 (×2): 237 mL via ORAL

## 2022-08-31 NOTE — Progress Notes (Addendum)
Initial Nutrition Assessment  DOCUMENTATION CODES:   Not applicable  INTERVENTION:   Glucerna Shake po TID, each supplement provides 220 kcal and 10 grams of protein   MVI po daily   Dysphagia 3 diet   Pt at refeed risk; recommend monitor potassium, magnesium and phosphorus labs daily until stable  NUTRITION DIAGNOSIS:   Inadequate oral intake related to acute illness as evidenced by meal completion < 25%.  GOAL:   Patient will meet greater than or equal to 90% of their needs  MONITOR:   PO intake, Supplement acceptance, Labs, Weight trends, I & O's, Skin  REASON FOR ASSESSMENT:   Malnutrition Screening Tool    ASSESSMENT:   62 y.o. 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  Met with pt in room today. Pt is sitting up in bed with her lunch tray sitting on her side table untouched. Pt reports good appetite and oral intake at baseline but reports poor oral intake for a few days. Pt reports that she is unable to feed herself at the present time r/t weakness in her arms. RN present in room attempting to adjust pt to a position where she feels she can eat better. Pt reports difficulty chewing r/t poor dentition and no top dentures. Pt reports that she likes vanilla Glucerna and drinks this sometimes at home. RD will add supplements and MVI to help pt meet her estimated needs. RD will change pt to a mechanical soft diet. Pt may require meal assist orders; will see how she does with different positioning. Per chart, pt appears weight stable pta.   Addendum 1346: Spoke with RN who reports that pt was able to feed herself once better positioned in bed; RD will hold on meal assist order for now.   Medications reviewed and include: azithromycin, lovenox, insulin, prednisone,  ceftriaxone   Labs reviewed: K 4.9 wnl, BUN 59(H), creat 3.67(H) Wbc- 18.0(H), Hgb 9.2(L), Hct 30.3(L) Cbgs- 127, 159 x 24 hrs AIC 6.2(H)- 1/29  NUTRITION - FOCUSED PHYSICAL EXAM:  Flowsheet Row Most Recent Value  Orbital Region No depletion  Upper Arm Region Moderate depletion  Thoracic and Lumbar Region No depletion  Buccal Region No depletion  Temple Region No depletion  Clavicle Bone Region No depletion  Clavicle and Acromion Bone Region No depletion  Scapular Bone Region No depletion  Dorsal Hand No depletion  Patellar Region Moderate depletion  Anterior Thigh Region Moderate depletion  Posterior Calf Region Moderate depletion  Edema (RD Assessment) None  Hair Reviewed  Eyes Reviewed  Mouth Reviewed  Skin Reviewed  Nails Reviewed   Diet Order:   Diet Order             DIET DYS 3 Room service appropriate? Yes; Fluid consistency: Thin  Diet effective now                  EDUCATION NEEDS:   Education needs have been addressed  Skin:  Skin Assessment: Reviewed RN Assessment  Last BM:  1/29  Height:   Ht Readings from Last 1 Encounters:  08/31/22 '5\' 2"'$  (1.575 m)    Weight:   Wt Readings from Last 1 Encounters:  08/31/22 59.8 kg    Ideal Body Weight:  50 kg  BMI:  Body mass index is 24.11 kg/m.  Estimated Nutritional  Needs:   Kcal:  1400-1600kcal/day  Protein:  70-80g/day  Fluid:  1.5-1.7L/day  Koleen Distance MS, RD, LDN Please refer to [facility] for RD and/or RD on-call/weekend/after hours pager

## 2022-08-31 NOTE — Progress Notes (Signed)
Wound vac dressing removed with Beth R., RN and wet-to-dry dressing placed according to accepting facility request.

## 2022-08-31 NOTE — TOC Initial Note (Signed)
Transition of Care [facility]) - Initial/Assessment Note    Patient Details  Name: Alicia Rogers MRN: [id number] Date of Birth: Sep 27, 1960  Transition of Care [facility]) CM/SW Contact:    Beverly Sessions, RN Phone Number: 08/31/2022, 11:49 AM  Clinical Narrative:                  Admitted for: covid PNA Admitted from: [facility] PCP: [facility]  Current home health/prior home health/DME: NA  Patient states that she has lived at [facility] since April. She gives me permission to speak with facility and sister  Attempted to call sister - VM not set up  Spoke with Stej (Owner) and Angie at [facility].  They are aware patient is RSV positive, and confirm patient can return.  They will provide transport at discharge. Per Stej patient is independent with ambulation at baseline.   MD to order mobility tech.  Email sent to [facility] at [email or web address], she is going to respond with the information they will need at discharge          Patient Goals and CMS Choice            Expected Discharge Plan and Services                                              Prior Living Arrangements/Services                       Activities of Daily Living Home Assistive Devices/Equipment: None ADL Screening (condition at time of admission) Patient's cognitive ability adequate to safely complete daily activities?: No Is the patient deaf or have difficulty hearing?: No Does the patient have difficulty seeing, even when wearing glasses/contacts?: No Does the patient have difficulty concentrating, remembering, or making decisions?: Yes Patient able to express need for assistance with ADLs?: Yes Does the patient have difficulty dressing or bathing?: Yes Independently performs ADLs?: No Communication: Independent Dressing (OT): Needs assistance Is this a change from baseline?: Pre-admission baseline Grooming: Needs  assistance Is this a change from baseline?: Pre-admission baseline Feeding: Independent with device (comment) Bathing: Needs assistance Is this a change from baseline?: Pre-admission baseline Toileting: Needs assistance Is this a change from baseline?: Pre-admission baseline In/Out Bed: Needs assistance Is this a change from baseline?: Pre-admission baseline Walks in Home: Appropriate for developmental age Does the patient have difficulty walking or climbing stairs?: Yes Weakness of Legs: Both Weakness of Arms/Hands: Both  Permission Sought/Granted                  Emotional Assessment              Admission diagnosis:  Acute respiratory distress [R06.03] RSV (acute bronchiolitis due to respiratory syncytial virus) [J21.0] Hypoxia [R09.02] Pneumonia due to respiratory syncytial virus (RSV) [J12.1] Community acquired pneumonia, unspecified laterality [J18.9] Patient Active Problem List   Diagnosis Date Noted   Pneumonia due to respiratory syncytial virus (RSV) 08/30/2022   Lung nodule 08/30/2022   Iron deficiency anemia 07/21/2022   Gastric erosion 07/21/2022   Polyp of duodenum 07/21/2022   Adenomatous polyp of descending colon 07/21/2022   MGUS (monoclonal gammopathy of unknown significance) 02/05/2022   Stage 3b chronic kidney disease ([place]) 12/22/2021   Paranoid schizophrenia ([place]) 11/25/2021   Proteinuria, unspecified 11/25/2021  Symptomatic anemia 11/25/2021   Hypertension 11/25/2021   Type 2 diabetes mellitus with diabetic chronic kidney disease ([place]) 11/25/2021   Schizoaffective disorder, bipolar type ([place]) 09/29/2021   Hypertensive urgency 40/98/1191   Acute metabolic encephalopathy 47/82/9562   Left lower lobe pneumonia 09/27/2021   Sinus bradycardia 09/27/2021   Anemia in chronic kidney disease (CKD) 09/27/2021   Hand swelling 09/27/2021   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   Uterine mass 03/03/2021   Irritant dermatitis 09/22/2020   Essential hypertension  02/28/2020   Back pain 02/28/2020   CKD (chronic kidney disease), stage IV ([place]) 02/28/2020   Hematuria 02/28/2020   HLD (hyperlipidemia) 02/28/2020   Irritant contact dermatitis due to other agents 03/08/2018   Increased frequency of urination 09/21/2017   Chronic kidney disease, stage 4 (severe) ([place]) 08/26/2017   Low back pain 05/26/2017   Extrapyramidal symptom 03/12/2016   Compulsive tobacco user syndrome 10/11/2014   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   Mixed hyperlipidemia 07/04/2014   Claudication ([place]) 06/09/2014   Schizoaffective disorder, depressive type ([place]) 06/09/2014   Controlled type 2 diabetes mellitus without complication ([place]) 13/03/6577   PCP:  Mechele Claude, FNP Pharmacy:   [place], [place] - [address] [place] [address] Phone: [phone] Fax: [phone]     Social Determinants of Health (SDOH) Social History: SDOH Screenings   Food Insecurity: No Food Insecurity (08/30/2022)  Housing: Low Risk  (08/30/2022)  Transportation Needs: No Transportation Needs (08/30/2022)  Utilities: Not At Risk (08/30/2022)  Alcohol Screen: Low Risk  (09/29/2021)  Tobacco Use: High Risk (08/31/2022)   SDOH Interventions:     Readmission Risk Interventions     No data to display

## 2022-08-31 NOTE — Hospital Course (Signed)
[name] is a 62 y.o. 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  Oxygen saturation was 88%, was placed on 2.5L oxygen.  Patient also had a multifocal pneumonia on x-ray with positive RSV.  Patient is started on antibiotics.

## 2022-08-31 NOTE — Progress Notes (Signed)
Mobility Specialist - Progress Note   08/31/22 1522  Mobility  Activity Ambulated with assistance in room  Level of Assistance Standby assist, set-up cues, supervision of patient - no hands on  Assistive Device None  Distance Ambulated (ft) 10 ft  Activity Response Tolerated well  Mobility Referral Yes  $Mobility charge 1 Mobility   Pt supine upon entry, utilizing RA. Pt completed bed mob MinA HHA for trunk support, once sitting EOB pt able to self support trunk. Pt STS from bed MinA, once standing Pt voiced some dizziness however still wanting to amb in the room. Pt amb to the bathroom door and back to bed, tolerated well. One LOB noted throughout the session, CGA to quickly recover. Pt returned to bed, left supine with needs within reach.   Candie Mile Mobility Specialist 08/31/22 3:36 PM

## 2022-08-31 NOTE — Progress Notes (Signed)
  Progress Note   Patient: Alicia Rogers JHE:[id number] DOB: 12-03-1960 DOA: 08/30/2022     0 DOS: the patient was seen and examined on 08/31/2022   Brief hospital course: Alicia Rogers is a 62 y.o. 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  Oxygen saturation was 88%, was placed on 2.5L oxygen.  Patient also had a multifocal pneumonia on x-ray with positive RSV.  Patient is started on antibiotics.  Assessment and Plan: Multifocal pneumonia secondary to RSV. Acute hypoxemic respite failure secondary to pneumonia. Oxygen is better today, she is off oxygen today. I personally reviewed patient chest x-ray and CT images, patient had a multifocal pneumonia.  Procalcitonin level mildly elevated, started on Rocephin and Zithromax. Patient also was placed on steroids.   Lung nodule CT chest with 2.7 x 2.4 cm area of solid and subsolid nodule in the right upper lobe in addition to an additional smaller nodule inferior.  Outpatient follow-up with pulmonology for rule out of neoplasm  Type 2 diabetes mellitus with diabetic chronic kidney disease ([place]) A1C 6.2, continue sliding scale insulin.  CKD (chronic kidney disease), stage IV ([place]) Renal function still stable.   Schizoaffective disorder, depressive type ([place]) Continue home medications       Subjective:  Patient still has a cough with occasional shortness of breath.  Physical Exam: Vitals:   08/31/22 0422 08/31/22 0719 08/31/22 0729 08/31/22 0800  BP: (!) 149/55   (!) 155/70  Pulse: 64   68  Resp: 18   17  Temp: 98.1 F (36.7 C)   98.2 F (36.8 C)  TempSrc:    Oral  SpO2: 98% 99% 94% 96%   General exam: Appears calm and comfortable  Respiratory system: Clear to auscultation. Respiratory  effort normal. Cardiovascular system: S1 & S2 heard, RRR. No JVD, murmurs, rubs, gallops or clicks. No pedal edema. Gastrointestinal system: Abdomen is nondistended, soft and nontender. No organomegaly or masses felt. Normal bowel sounds heard. Central nervous system: Alert and oriented. No focal neurological deficits. Extremities: Symmetric 5 x 5 power. Skin: No rashes, lesions or ulcers Psychiatry: Judgement and insight appear normal. Mood & affect appropriate.   Data Reviewed:  Reviewed imaging studies, personally reviewed patient CT scan images.  Reviewed lab results.  Family Communication: Not able to  Disposition: Status is: Inpatient Remains inpatient appropriate because: Severity of disease, IV treatment.  Planned Discharge Destination:  Group home    Time spent: 35 minutes  Author: Sharen Hones, MD 08/31/2022 11:52 AM  For on call review [email or web address].

## 2022-09-01 DIAGNOSIS — J121 Respiratory syncytial virus pneumonia: Secondary | ICD-10-CM | POA: Diagnosis not present

## 2022-09-01 LAB — GLUCOSE, CAPILLARY
Glucose-Capillary: 94 mg/dL (ref 70–99)
Glucose-Capillary: 222 mg/dL — ABNORMAL HIGH (ref 70–99)

## 2022-09-01 MED ORDER — SODIUM BICARBONATE 650 MG PO TABS
650.0000 mg | ORAL_TABLET | Freq: Two times a day (BID) | ORAL | Status: DC
  Administered 2022-09-01: 650 mg via ORAL
  Filled 2022-09-01: qty 1

## 2022-09-01 MED ORDER — GUAIFENESIN ER 600 MG PO TB12: 600.0000 mg | ORAL_TABLET | Freq: Two times a day (BID) | ORAL | 0 refills | Status: AC

## 2022-09-01 MED ORDER — SODIUM BICARBONATE 650 MG PO TABS: 650.0000 mg | ORAL_TABLET | Freq: Two times a day (BID) | ORAL | 0 refills | Status: AC

## 2022-09-01 MED ORDER — ALUM & MAG HYDROXIDE-SIMETH 200-200-20 MG/5ML PO SUSP
30.0000 mL | Freq: Four times a day (QID) | ORAL | Status: DC | PRN
  Administered 2022-09-01: 30 mL via ORAL
  Filled 2022-09-01: qty 30

## 2022-09-01 MED ORDER — GUAIFENESIN-DM 100-10 MG/5ML PO SYRP: 5.0000 mL | ORAL_SOLUTION | Freq: Four times a day (QID) | ORAL | 0 refills | Status: DC | PRN

## 2022-09-01 MED ORDER — LEVOFLOXACIN 750 MG PO TABS: 750.0000 mg | ORAL_TABLET | ORAL | 0 refills | Status: AC

## 2022-09-01 NOTE — Progress Notes (Signed)
Mobility Specialist - Progress Note   09/01/22 1146  Mobility  Activity Ambulated independently in hallway  Level of Assistance Modified independent, requires aide device or extra time  Assistive Device Front wheel walker  Distance Ambulated (ft) 40 ft  Activity Response Tolerated well  Mobility Referral Yes  $Mobility charge 1 Mobility   Pt supine upon entry, utilizing RA. Pt completed bed mob ModI, extra time to bring trunk upright. Pt STS to RW SBA and amb ModI. Pt amb 10 ft in the room without AD before opting to use the RW. Pt visibly more comfortable and secure while amb with RW. Pt able to go a further distance before fatigue to this date. Pt amb 40 ft in the hallway before voicing being dizzy. Pt returned to room, left supine with alarm set and needs within reach. MS spoke with RN regarding options to send Pt home with RW.   Candie Mile Mobility Specialist 09/01/22 11:52 AM

## 2022-09-01 NOTE — Discharge Summary (Signed)
[facility] Discharge Summary   Patient: Alicia Rogers HAL:[id number]  PCP: Mechele Claude, FNP  Date of admission: 08/30/2022   Date of discharge:  09/01/2022     Discharge Diagnoses:  Principal Problem:   Pneumonia due to respiratory syncytial virus (RSV) Active Problems:   Lung nodule   Type 2 diabetes mellitus with diabetic chronic kidney disease ([place])   CKD (chronic kidney disease), stage IV (HCC)   Schizoaffective disorder, depressive type ([place])   Acute respiratory failure with hypoxia ([place])   Admitted From: Family care home Disposition: Family care home   Recommendations for Outpatient Follow-up:  With PCP in 1 week, repeat CBC and BMP after 1 week.  Repeat CT chest in 8 to 12 weeks to rule out lung cancer, resolution of pneumonia and pulm nodules.  Incidental finding of thyroid nodules, needs thyroid ultrasound as an outpatient. Patient was advised to follow with nephrology and hematology as an outpatient as per schedule Follow up LABS/TEST:  as above   Diet recommendation: Carb modified diet  Activity: The patient is advised to gradually reintroduce usual activities, as tolerated  Discharge Condition: stable  Code Status: Full code   History of present illness: As per the H and P dictated on admission  Hospital Course:  Alicia Rogers is a 62 y.o. 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  Oxygen saturation was 88%, was placed on 2.5L oxygen.  Patient also had a multifocal pneumonia on x-ray with positive RSV.  Patient is started on antibiotics.   Assessment and Plan: # Multifocal pneumonia secondary to RSV. # Acute hypoxemic respite failure secondary to pneumonia. Oxygen is better today, she is off oxygen today. I  personally reviewed patient chest x-ray and CT images, patient had a multifocal pneumonia.  Procalcitonin level 0.17mldly elevated, s/p Rocephin and Zithromax. And patient was also given steroids.  Transition to oral antibiotics Levaquin 750 mg p.o. every 48 hours for total 10 days.  No need of more steroids.  Started Mucinex 600 mg p.o. twice daily for 7 days, Robitussin DM as needed for cough.  Still has leukocytosis could be due to steroids.  Repeat CBC after 1 week. # Lung nodule: CT chest with 2.7 x 2.4 cm area of solid and subsolid nodule in the right upper lobe in addition to an additional smaller nodule inferior. Outpatient follow-up with pulmonology for rule out of neoplasm. F/u with PCP to repeat CT chest in 8 to 12 weeks. # Type 2 diabetes mellitus with diabetic chronic kidney disease, A1C 6.2 well-controlled.  Continue diabetic diet.  Patient was on sliding scale insulin during hospital stay.  Resumed home medications on discharge. # CKD (chronic kidney disease), stage IV, Renal function still stable.  Recommend to follow with nephrology as an outpatient.  Patient had mild acidosis, started sodium bicarbonate 650 mg p.o. twice daily for 7 days.  Repeat BMP after 1 week. # Schizoaffective disorder, depressive type, Continue home medications # Thyroid nodule, incidental finding, need thyroid sonogram as an outpatient.  Recommend to follow with PCP.  Body mass index is 24.11 kg/m.  Nutrition Problem: Inadequate oral intake Etiology: acute illness Nutrition Interventions:   - Patient was instructed, not to drive, operate heavy machinery, perform activities at heights, swimming or participation in water activities  or provide baby sitting services while on Pain, Sleep and Anxiety Medications; until her outpatient Physician has advised to do so again.  - Also recommended to not to take more than prescribed Pain, Sleep and Anxiety Medications.  Patient was ambulatory without any  assistance. Patient was ambulated by mobility specialist, able to walk with a rolling walker.  TOC was following, patient can return back to family care home. On the day of the discharge the patient's vitals were stable, and no other acute medical condition were reported by patient. the patient was felt safe to be discharge at family care home.    Consultants: None Procedures: None  Discharge Exam: General: Appear in no distress, no Rash; Oral Mucosa Clear, moist. Cardiovascular: S1 and S2 Present, no Murmur, Respiratory: normal respiratory effort, Bilateral Air entry present and no Crackles, no wheezes Abdomen: Bowel Sound present, Soft and no tenderness, no hernia Extremities: no Pedal edema, no calf tenderness Neurology: alert and oriented to time, place, and person affect appropriate.  Filed Weights   08/31/22 1322  Weight: 59.8 kg   Vitals:   09/01/22 0510 09/01/22 0756  BP: (!) 155/44 (!) 154/63  Pulse: 75 70  Resp: 20 20  Temp: 98.5 F (36.9 C) 98.1 F (36.7 C)  SpO2: 94% 91%    DISCHARGE MEDICATION: Allergies as of 09/01/2022       Reactions   Penicillins    Rash        Medication List     TAKE these medications    aspirin EC 81 MG tablet Take 81 mg by mouth daily.   calcitRIOL 0.25 MCG capsule Commonly known as: ROCALTROL Take 0.25 mcg by mouth daily.   cyclobenzaprine 10 MG tablet Commonly known as: FLEXERIL Take 1 tablet (10 mg total) by mouth at bedtime.   dapagliflozin propanediol 5 MG Tabs tablet Commonly known as: FARXIGA Take 5 mg by mouth daily.   diltiazem 360 MG 24 hr capsule Commonly known as: CARDIZEM CD Take 1 capsule (360 mg total) by mouth daily.   ergocalciferol 1.25 MG (50000 UT) capsule Commonly known as: VITAMIN D2 Take 50,000 Units by mouth once a week. On Fridays   escitalopram 20 MG tablet Commonly known as: LEXAPRO Take 20 mg by mouth daily.   ferrous sulfate 325 (65 FE) MG tablet Take 325 mg by mouth 3 (three)  times daily with meals.   furosemide 20 MG tablet Commonly known as: LASIX Take 20 mg by mouth daily.   gabapentin 100 MG capsule Commonly known as: NEURONTIN Take 100 mg by mouth 3 (three) times daily.   guaiFENesin 600 MG 12 hr tablet Commonly known as: Mucinex Take 1 tablet (600 mg total) by mouth 2 (two) times daily for 7 days.   guaiFENesin-dextromethorphan 100-10 MG/5ML syrup Commonly known as: ROBITUSSIN DM Take 5 mLs by mouth every 6 (six) hours as needed for cough.   hydrALAZINE 50 MG tablet Commonly known as: APRESOLINE Take 1 tablet (50 mg total) by mouth 3 (three) times daily.   Lorayne Bender Sustenna injection Generic drug: Paliperidone ER SMARTSIG:117 Milligram(s) IM Once a Month   levofloxacin 750 MG tablet Commonly known as: Levaquin Take 1 tablet (750 mg total) by mouth every other day for 10 days.   loperamide 2 MG capsule Commonly known as: IMODIUM Take 2 mg by mouth 2 (two) times daily as needed for diarrhea or loose stools.   losartan 100 MG tablet Commonly known as: COZAAR Take 100 mg by mouth daily.  melatonin 3 MG Tabs tablet Take 6 mg by mouth at bedtime.   multivitamin Tabs tablet Take 1 tablet by mouth daily.   omeprazole 40 MG capsule Commonly known as: PRILOSEC Take 40 mg by mouth daily.   oxybutynin 15 MG 24 hr tablet Commonly known as: DITROPAN XL TAKE 1 TABLET BY MOUTH ONCE DAILY FOR BLADDER *WEAR GLOVES IF CRUSHING*   polyethylene glycol powder 17 GM/SCOOP powder Commonly known as: GLYCOLAX/MIRALAX Take by mouth.   sodium bicarbonate 650 MG tablet Take 1 tablet (650 mg total) by mouth 2 (two) times daily for 7 days.   traMADol 50 MG tablet Commonly known as: Ultram Take 1 tablet (50 mg total) by mouth 2 (two) times daily as needed for moderate pain.       Allergies  Allergen Reactions   Penicillins     Rash   Discharge Instructions     Call MD for:  difficulty breathing, headache or visual disturbances   Complete by:  As directed    Call MD for:  extreme fatigue   Complete by: As directed    Call MD for:  persistant dizziness or light-headedness   Complete by: As directed    Call MD for:  persistant nausea and vomiting   Complete by: As directed    Call MD for:  severe uncontrolled pain   Complete by: As directed    Call MD for:  temperature >100.4   Complete by: As directed    Diet - low sodium heart healthy   Complete by: As directed    Discharge instructions   Complete by: As directed    With PCP in 1 week, repeat CBC and BMP after 1 week.  Repeat CT chest in 8 to 12 weeks to rule out lung cancer, resolution of pneumonia and pulm nodules.  Incidental finding of thyroid nodules, needs thyroid ultrasound as an outpatient. Patient was advised to follow with nephrology and hematology as an outpatient as per schedule.   Increase activity slowly   Complete by: As directed        The results of significant diagnostics from this hospitalization (including imaging, microbiology, ancillary and laboratory) are listed below for reference.    Significant Diagnostic Studies: CT CHEST WO CONTRAST  Result Date: 08/30/2022 CLINICAL DATA:  62 year old female with pleural effusion and suspected malignancy. History of MGUSz. EXAM: CT CHEST WITHOUT CONTRAST TECHNIQUE: Multidetector CT imaging of the chest was performed following the standard protocol without IV contrast. RADIATION DOSE REDUCTION: This exam was performed according to the departmental dose-optimization program which includes automated exposure control, adjustment of the mA and/or kV according to patient size and/or use of iterative reconstruction technique. COMPARISON:  February 28, 2020 FINDINGS: Cardiovascular: Calcified aortic atherosclerotic changes. Smooth contour of the aorta. Normal caliber of the thoracic aorta. Mild cardiomegaly without pericardial effusion or nodularity. Normal caliber of central pulmonary vessels. Mediastinum/Nodes: Mildly  patulous esophagus. No adenopathy in the thoracic inlet. No signs of axillary lymphadenopathy. No mediastinal adenopathy with top-normal size lymph nodes in the chest particularly in the AP window measuring 9 mm (image 46/2) no gross hilar adenopathy on noncontrast imaging. 15 mm RIGHT thyroid nodule. Lungs/Pleura: Multifocal subsolid areas of nodularity throughout the chest. Lungs were clear on previous imaging from 2021 with no prior imaging of the more recent time point for comparison lung apices clear in October of 2022. Dominant area with solid and subsolid characteristics measures 2.7 x 2.4 cm more dense component centrally measuring up to 13  mm. This is in the RIGHT upper lobe anteriorly. Subsolid nodule in the RIGHT inferior upper lobe (image 60/4) 12 mm. Patchy areas of ground-glass at the lung bases. Airways are patent with respect to large airways. There is some material in lower lobe bronchi. No pneumothorax. No pleural effusion. Signs of moderate pulmonary emphysema that is worse towards the lung apices. Upper Abdomen: Smooth contour the visualized liver. LEFT lateral adrenal adenoma 2.5 x 1.7 cm density less than 0 Hounsfield units. Expansion of the LEFT adrenal gland in a generalized fashion associated with the body of the LEFT adrenal measuring 2.2 x 1.6 cm, stable compared to previous imaging. Stable nodularity of the RIGHT adrenal gland also with low attenuation compatible with adrenal adenoma. Musculoskeletal: No chest wall mass. No destructive bone finding or acute bone process. IMPRESSION: 1. Multifocal areas of ground-glass and subsolid nodules throughout the chest most suggestive of infectious or inflammatory etiology but not present on prior imaging. Findings would favor if infectious atypical infection or perhaps even a component of aspiration at the lung bases. 2. Close follow-up will be helpful to exclude the possibility of neoplasm particularly in the anterior RIGHT upper lobe. Ultimately  if there are respiratory symptoms pulmonology consultation is advised. Consider follow-up in 8-12 weeks following therapy. 3. 15 mm RIGHT thyroid nodule. Recommend thyroid ultrasound if not yet performed on follow-up. 4. Bilateral adrenal adenomas. 5. Aortic atherosclerosis. Aortic Atherosclerosis (ICD10-I70.0). Electronically Signed   By: Zetta Bills M.D.   On: 08/30/2022 18:28   DG Chest 2 View  Result Date: 08/30/2022 CLINICAL DATA:  Cough beginning this morning EXAM: CHEST - 2 VIEW COMPARISON:  09/27/2021 FINDINGS: Enlargement of cardiac silhouette. Mediastinal contours and pulmonary vascularity normal. Atherosclerotic calcification aorta. Mild RIGHT basilar atelectasis. Atelectasis versus infiltrate at LEFT base with small LEFT pleural effusion blunting the costophrenic angle. Upper lungs clear. No pneumothorax or acute osseous findings. IMPRESSION: Enlargement of cardiac silhouette with RIGHT basilar atelectasis. Atelectasis versus consolidation LEFT lung base with small LEFT pleural effusion. Electronically Signed   By: Lavonia Dana M.D.   On: 08/30/2022 13:59    Microbiology: Recent Results (from the past 240 hour(s))  Resp panel by RT-PCR (RSV, Flu A&B, Covid) Anterior Nasal Swab     Status: Abnormal   Collection Time: 08/30/22 12:44 PM   Specimen: Anterior Nasal Swab  Result Value Ref Range Status   SARS Coronavirus 2 by RT PCR NEGATIVE NEGATIVE Final    Comment: (NOTE) SARS-CoV-2 target nucleic acids are NOT DETECTED.  The SARS-CoV-2 RNA is generally detectable in upper respiratory specimens during the acute phase of infection. The lowest concentration of SARS-CoV-2 viral copies this assay can detect is 138 copies/mL. A negative result does not preclude SARS-Cov-2 infection and should not be used as the sole basis for treatment or other patient management decisions. A negative result may occur with  improper specimen collection/handling, submission of specimen other than  nasopharyngeal swab, presence of viral mutation(s) within the areas targeted by this assay, and inadequate number of viral copies(<138 copies/mL). A negative result must be combined with clinical observations, patient history, and epidemiological information. The expected result is Negative.  Fact Sheet for Patients:  EntrepreneurPulse.com.au  Fact Sheet for Healthcare Providers:  IncredibleEmployment.be  This test is no t yet approved or cleared by the [place] FDA and  has been authorized for detection and/or diagnosis of SARS-CoV-2 by FDA under an Emergency Use Authorization (EUA). This EUA will remain  in effect (meaning this test can  be used) for the duration of the COVID-19 declaration under Section 564(b)(1) of the Act, 21 U.S.C.section 360bbb-3(b)(1), unless the authorization is terminated  or revoked sooner.       Influenza A by PCR NEGATIVE NEGATIVE Final   Influenza B by PCR NEGATIVE NEGATIVE Final    Comment: (NOTE) The Xpert Xpress SARS-CoV-2/FLU/RSV plus assay is intended as an aid in the diagnosis of influenza from Nasopharyngeal swab specimens and should not be used as a sole basis for treatment. Nasal washings and aspirates are unacceptable for Xpert Xpress SARS-CoV-2/FLU/RSV testing.  Fact Sheet for Patients: EntrepreneurPulse.com.au  Fact Sheet for Healthcare Providers: IncredibleEmployment.be  This test is not yet approved or cleared by the [place] FDA and has been authorized for detection and/or diagnosis of SARS-CoV-2 by FDA under an Emergency Use Authorization (EUA). This EUA will remain in effect (meaning this test can be used) for the duration of the COVID-19 declaration under Section 564(b)(1) of the Act, 21 U.S.C. section 360bbb-3(b)(1), unless the authorization is terminated or revoked.     Resp Syncytial Virus by PCR POSITIVE (A) NEGATIVE Final    Comment:  (NOTE) Fact Sheet for Patients: EntrepreneurPulse.com.au  Fact Sheet for Healthcare Providers: IncredibleEmployment.be  This test is not yet approved or cleared by the [place] FDA and has been authorized for detection and/or diagnosis of SARS-CoV-2 by FDA under an Emergency Use Authorization (EUA). This EUA will remain in effect (meaning this test can be used) for the duration of the COVID-19 declaration under Section 564(b)(1) of the Act, 21 U.S.C. section 360bbb-3(b)(1), unless the authorization is terminated or revoked.  Performed at [facility], [address] [place], [place] [address]      Labs: CBC: Recent Labs  Lab 08/30/22 1633 08/31/22 0358  WBC 17.1* 18.0*  NEUTROABS 12.7* 16.5*  HGB 8.7* 9.2*  HCT 29.3* 30.3*  MCV 94.8 93.5  PLT 283 250   Basic Metabolic Panel: Recent Labs  Lab 08/30/22 1633 08/31/22 0358  NA 134* 137  K 5.0 4.9  CL 104 109  CO2 19* 18*  GLUCOSE 126* 189*  BUN 55* 59*  CREATININE 3.54* 3.67*  CALCIUM 9.4 9.2   Liver Function Tests: Recent Labs  Lab 08/30/22 1633  AST 78*  ALT 61*  ALKPHOS 103  BILITOT 0.9  PROT 7.7  ALBUMIN 3.1*   No results for input(s): "LIPASE", "AMYLASE" in the last 168 hours. No results for input(s): "AMMONIA" in the last 168 hours. Cardiac Enzymes: No results for input(s): "CKTOTAL", "CKMB", "CKMBINDEX", "TROPONINI" in the last 168 hours. BNP (last 3 results) No results for input(s): "BNP" in the last 8760 hours. CBG: Recent Labs  Lab 08/31/22 1307 08/31/22 1659 08/31/22 2112 09/01/22 0758 09/01/22 1219  GLUCAP 127* 164* 134* 94 222*    Time spent: 35 minutes  Signed:  Val Riles  [facility] 09/01/2022 12:35 PM

## 2022-09-01 NOTE — Plan of Care (Signed)
  Problem: Education: Goal: Ability to describe self-care measures that may prevent or decrease complications (Diabetes Survival Skills Education) will improve Outcome: Progressing Goal: Individualized Educational Video(s) Outcome: Progressing   Problem: Coping: Goal: Ability to adjust to condition or change in health will improve Outcome: Progressing   Problem: Fluid Volume: Goal: Ability to maintain a balanced intake and output will improve Outcome: Progressing   Problem: Health Behavior/Discharge Planning: Goal: Ability to identify and utilize available resources and services will improve Outcome: Progressing Goal: Ability to manage health-related needs will improve Outcome: Progressing   Problem: Nutritional: Goal: Maintenance of adequate nutrition will improve Outcome: Progressing Goal: Progress toward achieving an optimal weight will improve Outcome: Progressing   Problem: Metabolic: Goal: Ability to maintain appropriate glucose levels will improve Outcome: Progressing   Problem: Skin Integrity: Goal: Risk for impaired skin integrity will decrease Outcome: Progressing

## 2022-09-01 NOTE — NC FL2 (Signed)
[place] LEVEL OF CARE FORM     IDENTIFICATION  Patient Name: Alicia Rogers Birthdate: 1961-05-17 Sex: female Admission Date (Current Location): 08/30/2022  [facility] and [place] Number:  Engineering geologist and Address:         Provider Number: [id number]  Attending Physician Name and Address:  Val Riles, MD  Relative Name and Phone Number:       Current Level of Care: Hospital Recommended Level of Care: [place] Prior Approval Number:    Date Approved/Denied:   PASRR Number:    Discharge Plan: Other (Comment) ([place])    Current Diagnoses: Patient Active Problem List   Diagnosis Date Noted   Acute respiratory failure with hypoxia ([place]) 08/31/2022   Pneumonia due to respiratory syncytial virus (RSV) 08/30/2022   Lung nodule 08/30/2022   Iron deficiency anemia 07/21/2022   Gastric erosion 07/21/2022   Polyp of duodenum 07/21/2022   Adenomatous polyp of descending colon 07/21/2022   MGUS (monoclonal gammopathy of unknown significance) 02/05/2022   Stage 3b chronic kidney disease ([place]) 12/22/2021   Paranoid schizophrenia ([place]) 11/25/2021   Proteinuria, unspecified 11/25/2021   Symptomatic anemia 11/25/2021   Hypertension 11/25/2021   Type 2 diabetes mellitus with diabetic chronic kidney disease ([place]) 11/25/2021   Schizoaffective disorder, bipolar type ([place]) 09/29/2021   Hypertensive urgency 63/08/6008   Acute metabolic encephalopathy 93/23/5573   Left lower lobe pneumonia 09/27/2021   Sinus bradycardia 09/27/2021   Anemia in chronic kidney disease (CKD) 09/27/2021   Hand swelling 09/27/2021   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   Uterine mass 03/03/2021   Irritant dermatitis 09/22/2020   Essential hypertension 02/28/2020   Back pain 02/28/2020   CKD (chronic kidney disease), stage IV ([place]) 02/28/2020   Hematuria 02/28/2020   HLD (hyperlipidemia) 02/28/2020   Irritant contact dermatitis due to other agents 03/08/2018   Increased  frequency of urination 09/21/2017   Chronic kidney disease, stage 4 (severe) ([place]) 08/26/2017   Low back pain 05/26/2017   Extrapyramidal symptom 03/12/2016   Compulsive tobacco user syndrome 10/11/2014   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   Mixed hyperlipidemia 07/04/2014   Claudication ([place]) 06/09/2014   Schizoaffective disorder, depressive type ([place]) 06/09/2014   Controlled type 2 diabetes mellitus without complication ([place]) 22/09/5425    Orientation RESPIRATION BLADDER Height & Weight     Self, Place, Situation, Time  Normal Incontinent Weight: 59.8 kg Height:  '5\' 2"'$  (157.5 cm)  BEHAVIORAL SYMPTOMS/MOOD NEUROLOGICAL BOWEL NUTRITION STATUS      Continent Diet (Carb Modified)  AMBULATORY STATUS COMMUNICATION OF NEEDS Skin   Supervision Verbally Normal                       Personal Care Assistance Level of Assistance              Functional Limitations Info             SPECIAL CARE FACTORS FREQUENCY                       Contractures Contractures Info: Not present    Additional Factors Info  Code Status, Allergies Code Status Info: Full Allergies Info: Penicillin           Medication List       TAKE these medications     aspirin EC 81 MG tablet Take 81 mg by mouth daily.    calcitRIOL 0.25 MCG capsule Commonly known as:  ROCALTROL Take 0.25 mcg by mouth daily.    cyclobenzaprine 10 MG tablet Commonly known as: FLEXERIL Take 1 tablet (10 mg total) by mouth at bedtime.    dapagliflozin propanediol 5 MG Tabs tablet Commonly known as: FARXIGA Take 5 mg by mouth daily.    diltiazem 360 MG 24 hr capsule Commonly known as: CARDIZEM CD Take 1 capsule (360 mg total) by mouth daily.    ergocalciferol 1.25 MG (50000 UT) capsule Commonly known as: VITAMIN D2 Take 50,000 Units by mouth once a week. On Fridays    escitalopram 20 MG tablet Commonly known as: LEXAPRO Take 20 mg by mouth daily.    ferrous  sulfate 325 (65 FE) MG tablet Take 325 mg by mouth 3 (three) times daily with meals.    furosemide 20 MG tablet Commonly known as: LASIX Take 20 mg by mouth daily.    gabapentin 100 MG capsule Commonly known as: NEURONTIN Take 100 mg by mouth 3 (three) times daily.    guaiFENesin 600 MG 12 hr tablet Commonly known as: Mucinex Take 1 tablet (600 mg total) by mouth 2 (two) times daily for 7 days.    guaiFENesin-dextromethorphan 100-10 MG/5ML syrup Commonly known as: ROBITUSSIN DM Take 5 mLs by mouth every 6 (six) hours as needed for cough.    hydrALAZINE 50 MG tablet Commonly known as: APRESOLINE Take 1 tablet (50 mg total) by mouth 3 (three) times daily.    Lorayne Bender Sustenna injection Generic drug: Paliperidone ER SMARTSIG:117 Milligram(s) IM Once a Month    levofloxacin 750 MG tablet Commonly known as: Levaquin Take 1 tablet (750 mg total) by mouth every other day for 10 days.    loperamide 2 MG capsule Commonly known as: IMODIUM Take 2 mg by mouth 2 (two) times daily as needed for diarrhea or loose stools.    losartan 100 MG tablet Commonly known as: COZAAR Take 100 mg by mouth daily.    melatonin 3 MG Tabs tablet Take 6 mg by mouth at bedtime.    multivitamin Tabs tablet Take 1 tablet by mouth daily.    omeprazole 40 MG capsule Commonly known as: PRILOSEC Take 40 mg by mouth daily.    oxybutynin 15 MG 24 hr tablet Commonly known as: DITROPAN XL TAKE 1 TABLET BY MOUTH ONCE DAILY FOR BLADDER *WEAR GLOVES IF CRUSHING*    polyethylene glycol powder 17 GM/SCOOP powder Commonly known as: GLYCOLAX/MIRALAX Take by mouth.    sodium bicarbonate 650 MG tablet Take 1 tablet (650 mg total) by mouth 2 (two) times daily for 7 days.    traMADol 50 MG tablet Commonly known as: Ultram Take 1 tablet (50 mg total) by mouth 2 (two) times daily as needed for moderate pain.     Relevant Imaging Results:  Relevant Lab Results:   Additional Information    Beverly Sessions, RN

## 2022-09-01 NOTE — TOC Progression Note (Signed)
Transition of Care [facility]) - Progression Note    Patient Details  Name: Alicia Rogers MRN: [id number] Date of Birth: [birth year]  Transition of Care [facility]) CM/SW Contact  Beverly Sessions, RN Phone Number: 09/01/2022, 10:18 AM  Clinical Narrative:      Have not received return email from facility in regards to what information they will require at discharge - Documented patient on RA - Patient ambulated 10 feet with mobility tech       Expected Discharge Plan and Services                                               Social Determinants of Health (SDOH) Interventions SDOH Screenings   Food Insecurity: No Food Insecurity (08/30/2022)  Housing: Low Risk  (08/30/2022)  Transportation Needs: No Transportation Needs (08/30/2022)  Utilities: Not At Risk (08/30/2022)  Alcohol Screen: Low Risk  (09/29/2021)  Tobacco Use: High Risk (08/31/2022)    Readmission Risk Interventions     No data to display

## 2022-09-01 NOTE — TOC Transition Note (Signed)
Transition of Care [facility]) - CM/SW Discharge Note   Patient Details  Name: Alicia Rogers MRN: [id number] Date of Birth: Nov 01, 1960  Transition of Care [facility]) CM/SW Contact:  Beverly Sessions, RN Phone Number: 09/01/2022, 3:14 PM   Clinical Narrative:     Patient to discharge today Confirmed with Angie at [facility] that patient can return today. Faxed Fl2 and dc summary to Angie.  Hard copy in discharge packet.  Sister aki to transport today at discharge         Patient Goals and CMS Choice      Discharge Placement                         Discharge Plan and Services Additional resources added to the After Visit Summary for                                       Social Determinants of Health (SDOH) Interventions SDOH Screenings   Food Insecurity: No Food Insecurity (08/30/2022)  Housing: Low Risk  (08/30/2022)  Transportation Needs: No Transportation Needs (08/30/2022)  Utilities: Not At Risk (08/30/2022)  Alcohol Screen: Low Risk  (09/29/2021)  Tobacco Use: High Risk (08/31/2022)     Readmission Risk Interventions     No data to display

## 2022-09-02 ENCOUNTER — Other Ambulatory Visit: Payer: Self-pay

## 2022-09-03 ENCOUNTER — Encounter: Payer: Self-pay | Admitting: Family

## 2022-09-03 ENCOUNTER — Ambulatory Visit (INDEPENDENT_AMBULATORY_CARE_PROVIDER_SITE_OTHER): Payer: 59 | Admitting: Family

## 2022-09-03 ENCOUNTER — Encounter: Payer: Self-pay | Admitting: Internal Medicine

## 2022-09-03 VITALS — BP 120/60 | HR 81 | Ht 63.0 in | Wt 122.0 lb

## 2022-09-03 DIAGNOSIS — I739 Peripheral vascular disease, unspecified: Secondary | ICD-10-CM

## 2022-09-03 DIAGNOSIS — N184 Chronic kidney disease, stage 4 (severe): Secondary | ICD-10-CM

## 2022-09-03 DIAGNOSIS — J449 Chronic obstructive pulmonary disease, unspecified: Secondary | ICD-10-CM | POA: Insufficient documentation

## 2022-09-03 DIAGNOSIS — E1122 Type 2 diabetes mellitus with diabetic chronic kidney disease: Secondary | ICD-10-CM | POA: Diagnosis not present

## 2022-09-03 DIAGNOSIS — E782 Mixed hyperlipidemia: Secondary | ICD-10-CM

## 2022-09-03 DIAGNOSIS — J121 Respiratory syncytial virus pneumonia: Secondary | ICD-10-CM

## 2022-09-03 DIAGNOSIS — I10 Essential (primary) hypertension: Secondary | ICD-10-CM | POA: Diagnosis not present

## 2022-09-03 DIAGNOSIS — E559 Vitamin D deficiency, unspecified: Secondary | ICD-10-CM

## 2022-09-03 DIAGNOSIS — Z716 Tobacco abuse counseling: Secondary | ICD-10-CM

## 2022-09-03 DIAGNOSIS — J309 Allergic rhinitis, unspecified: Secondary | ICD-10-CM

## 2022-09-03 LAB — POCT UA - MICROALBUMIN
Albumin/Creatinine Ratio, Urine, POC: >300
Creatinine, POC: 200 mg/dL
Microalbumin Ur, POC: 150 mg/L

## 2022-09-03 MED ORDER — BREZTRI AEROSPHERE 160-9-4.8 MCG/ACT IN AERO: 2.0000 | INHALATION_SPRAY | Freq: Two times a day (BID) | RESPIRATORY_TRACT | 11 refills | Status: AC

## 2022-09-03 MED ORDER — FLUTICASONE PROPIONATE 50 MCG/ACT NA SUSP: 2.0000 | Freq: Every day | NASAL | 3 refills | Status: DC

## 2022-09-03 MED ORDER — CORICIDIN HBP 10-325-2 MG PO TABS: 1.0000 | ORAL_TABLET | Freq: Four times a day (QID) | ORAL | 0 refills | Status: AC | PRN

## 2022-09-03 NOTE — Progress Notes (Signed)
Subjective:    Patient ID: Alicia Rogers, female    DOB: 1961/03/16, 62 y.o.   MRN: [id number]  Patient is here for her 3 month f/u.   She has been feeling well since her last appointment.    Due for labs No other concerns at this time  HM Mammogram         Overdue - MAMMOGRAM (Every 2 Years) Overdue since 08/29/2020   08/29/2018  MM 3D SCREEN BREAST BILATERAL          HM PAP         Overdue - PAP SMEAR-Modifier (Every 3 Years) Overdue - never done   No completion history exists for this topic.        HM Colonoscopy         COLONOSCOPY (Pts 45-40yr Insurance coverage will need to be confirmed)  (Every 3 Years) Next due on 07/21/2025   07/21/2022  COLONOSCOPY   Only the first 1 history entries have been loaded, but more history  exists.         Health Maintenance reviewed - urine microalbumin ordered     Review of Systems  Constitutional:  Positive for fatigue.  HENT:  Positive for postnasal drip, rhinorrhea, sinus pressure and sinus pain.   Respiratory:  Positive for cough.   Cardiovascular:  Negative for chest pain, palpitations and leg swelling.  Allergic/Immunologic: Positive for environmental allergies.       Objective:   Physical Exam Vitals and nursing note reviewed. Exam conducted with a chaperone present.  Constitutional:      Appearance: She is ill-appearing.  HENT:     Nose: Congestion present.  Eyes:     Pupils: Pupils are equal, round, and reactive to light.  Pulmonary:     Effort: Pulmonary effort is normal. No respiratory distress.     Breath sounds: Decreased breath sounds and rhonchi present.  Musculoskeletal:        General: Normal range of motion.  Neurological:     Mental Status: She is alert.  Psychiatric:        Attention and Perception: Attention normal.        Mood and Affect: Mood normal. Affect is flat.        Speech: Speech normal.        Behavior: Behavior is cooperative.        Cognition and Memory: Cognition  is impaired.        Judgment: Judgment is impulsive.           Assessment & Plan:   Dannya was seen today for back pain.  Diagnoses and all orders for this visit:  Pneumonia due to respiratory syncytial virus (RSV) -     Diet regular -     CBC With Differential  Type 2 diabetes mellitus with stage 4 chronic kidney disease, unspecified whether long term insulin use (HCC) -     CMP14+EGFR -     Hemoglobin A1c -     POCT urine qual dipstick protein  Essential hypertension -     CBC With Differential -     CMP14+EGFR  Mixed hyperlipidemia -     Lipid panel  B12 deficiency due to diet  Chronic obstructive pulmonary disease, unspecified COPD type ([place] -     BREZTRI AEROSPHERE 160-9-4.8 MCG/ACT AERO; Inhale 2 puffs into the lungs in the morning and at bedtime. Start taking after antibiotics are completed.  Vitamin D deficiency, unspecified -  VITAMIN D 25 Hydroxy (Vit-D Deficiency, Fractures)  Allergic rhinitis, unspecified seasonality, unspecified trigger  Claudication ([place])  Tobacco abuse counseling  Other orders -     fluticasone (FLONASE) 50 MCG/ACT nasal spray; Place 2 sprays into both nostrils daily. -     DM-APAP-CPM (CORICIDIN HBP) 10-325-2 MG TABS; Take 1 tablet by mouth every 6 (six) hours as needed for up to 10 days.    Labs today - will call with results.   Continue current meds POC reviewed and agreed to.   Return in about 3 months (around 12/02/2022).

## 2022-09-04 LAB — VITAMIN D 25 HYDROXY (VIT D DEFICIENCY, FRACTURES): Vit D, 25-Hydroxy: 37 ng/mL (ref 30.0–100.0)

## 2022-09-04 LAB — CBC WITH DIFFERENTIAL
Basophils Absolute: 0.1 10*3/uL (ref 0.0–0.2)
Basos: 1 %
EOS (ABSOLUTE): 0 10*3/uL (ref 0.0–0.4)
Eos: 0 %
Hematocrit: 29.3 % — ABNORMAL LOW (ref 34.0–46.6)
Hemoglobin: 9.3 g/dL — ABNORMAL LOW (ref 11.1–15.9)
Immature Grans (Abs): 0.7 10*3/uL — ABNORMAL HIGH (ref 0.0–0.1)
Immature Granulocytes: 5 %
Lymphocytes Absolute: 2 10*3/uL (ref 0.7–3.1)
Lymphs: 14 %
MCH: 28.1 pg (ref 26.6–33.0)
MCHC: 31.7 g/dL (ref 31.5–35.7)
MCV: 89 fL (ref 79–97)
Monocytes Absolute: 1.3 10*3/uL — ABNORMAL HIGH (ref 0.1–0.9)
Monocytes: 9 %
Neutrophils Absolute: 9.9 10*3/uL — ABNORMAL HIGH (ref 1.4–7.0)
Neutrophils: 71 %
RBC: 3.31 x10E6/uL — ABNORMAL LOW (ref 3.77–5.28)
RDW: 12.2 % (ref 11.7–15.4)
WBC: 13.8 10*3/uL — ABNORMAL HIGH (ref 3.4–10.8)

## 2022-09-04 LAB — LIPID PANEL
Chol/HDL Ratio: 2.7 ratio (ref 0.0–4.4)
Cholesterol, Total: 206 mg/dL — ABNORMAL HIGH (ref 100–199)
HDL: 75 mg/dL (ref >39)
LDL Chol Calc (NIH): 100 mg/dL — ABNORMAL HIGH (ref 0–99)
Triglycerides: 185 mg/dL — ABNORMAL HIGH (ref 0–149)
VLDL Cholesterol Cal: 31 mg/dL (ref 5–40)

## 2022-09-04 LAB — CMP14+EGFR
ALT: 33 IU/L — ABNORMAL HIGH (ref 0–32)
AST: 22 IU/L (ref 0–40)
Albumin/Globulin Ratio: 1.2 (ref 1.2–2.2)
Albumin: 3.6 g/dL — ABNORMAL LOW (ref 3.9–4.9)
Alkaline Phosphatase: 113 IU/L (ref 44–121)
BUN/Creatinine Ratio: 19 (ref 12–28)
BUN: 69 mg/dL — ABNORMAL HIGH (ref 8–27)
Bilirubin Total: <0.2 mg/dL (ref 0.0–1.2)
CO2: 17 mmol/L — ABNORMAL LOW (ref 20–29)
Calcium: 9.1 mg/dL (ref 8.7–10.3)
Chloride: 107 mmol/L — ABNORMAL HIGH (ref 96–106)
Creatinine, Ser: 3.72 mg/dL — ABNORMAL HIGH (ref 0.57–1.00)
Globulin, Total: 3.1 g/dL (ref 1.5–4.5)
Glucose: 192 mg/dL — ABNORMAL HIGH (ref 70–99)
Potassium: 4.6 mmol/L (ref 3.5–5.2)
Sodium: 140 mmol/L (ref 134–144)
Total Protein: 6.7 g/dL (ref 6.0–8.5)
eGFR: 13 mL/min/{1.73_m2} — ABNORMAL LOW (ref >59)

## 2022-09-04 LAB — HEMOGLOBIN A1C
Est. average glucose Bld gHb Est-mCnc: 134 mg/dL
Hgb A1c MFr Bld: 6.3 % — ABNORMAL HIGH (ref 4.8–5.6)

## 2022-09-30 ENCOUNTER — Emergency Department: Payer: 59

## 2022-09-30 ENCOUNTER — Emergency Department
Admission: EM | Admit: 2022-09-30 | Discharge: 2022-09-30 | Disposition: A | Discharge status: Other Acute Inpatient Hospital | Payer: 59 | Attending: Emergency Medicine | Admitting: Emergency Medicine

## 2022-09-30 ENCOUNTER — Other Ambulatory Visit: Payer: Self-pay

## 2022-09-30 DIAGNOSIS — K6811 Postprocedural retroperitoneal abscess: Secondary | ICD-10-CM | POA: Diagnosis not present

## 2022-09-30 DIAGNOSIS — E1122 Type 2 diabetes mellitus with diabetic chronic kidney disease: Secondary | ICD-10-CM | POA: Insufficient documentation

## 2022-09-30 DIAGNOSIS — K651 Peritoneal abscess: Secondary | ICD-10-CM | POA: Insufficient documentation

## 2022-09-30 DIAGNOSIS — K567 Ileus, unspecified: Secondary | ICD-10-CM | POA: Insufficient documentation

## 2022-09-30 DIAGNOSIS — N189 Chronic kidney disease, unspecified: Secondary | ICD-10-CM | POA: Insufficient documentation

## 2022-09-30 DIAGNOSIS — R140 Abdominal distension (gaseous): Secondary | ICD-10-CM | POA: Diagnosis present

## 2022-09-30 DIAGNOSIS — T8143XA Infection following a procedure, organ and space surgical site, initial encounter: Secondary | ICD-10-CM

## 2022-09-30 DIAGNOSIS — N179 Acute kidney failure, unspecified: Secondary | ICD-10-CM | POA: Insufficient documentation

## 2022-09-30 LAB — CBC WITH DIFFERENTIAL/PLATELET
Abs Immature Granulocytes: 0.15 10*3/uL — ABNORMAL HIGH (ref 0.00–0.07)
Basophils Absolute: 0.1 10*3/uL (ref 0.0–0.1)
Basophils Relative: 0 %
Eosinophils Absolute: 0.1 10*3/uL (ref 0.0–0.5)
Eosinophils Relative: 1 %
HCT: 25.7 % — ABNORMAL LOW (ref 36.0–46.0)
Hemoglobin: 7.8 g/dL — ABNORMAL LOW (ref 12.0–15.0)
Immature Granulocytes: 1 %
Lymphocytes Relative: 10 %
Lymphs Abs: 1.4 10*3/uL (ref 0.7–4.0)
MCH: 28.4 pg (ref 26.0–34.0)
MCHC: 30.4 g/dL (ref 30.0–36.0)
MCV: 93.5 fL (ref 80.0–100.0)
Monocytes Absolute: 1.3 10*3/uL — ABNORMAL HIGH (ref 0.1–1.0)
Monocytes Relative: 9 %
Neutro Abs: 10.8 10*3/uL — ABNORMAL HIGH (ref 1.7–7.7)
Neutrophils Relative %: 79 %
Platelets: 421 10*3/uL — ABNORMAL HIGH (ref 150–400)
RBC: 2.75 MIL/uL — ABNORMAL LOW (ref 3.87–5.11)
RDW: 13.5 % (ref 11.5–15.5)
WBC: 13.8 10*3/uL — ABNORMAL HIGH (ref 4.0–10.5)
nRBC: 0.1 % (ref 0.0–0.2)

## 2022-09-30 LAB — COMPREHENSIVE METABOLIC PANEL
ALT: 10 U/L (ref 0–44)
AST: 16 U/L (ref 15–41)
Albumin: 2.3 g/dL — ABNORMAL LOW (ref 3.5–5.0)
Alkaline Phosphatase: 134 U/L — ABNORMAL HIGH (ref 38–126)
Anion gap: 13 (ref 5–15)
BUN: 70 mg/dL — ABNORMAL HIGH (ref 8–23)
CO2: 20 mmol/L — ABNORMAL LOW (ref 22–32)
Calcium: 8.2 mg/dL — ABNORMAL LOW (ref 8.9–10.3)
Chloride: 102 mmol/L (ref 98–111)
Creatinine, Ser: 5.67 mg/dL — ABNORMAL HIGH (ref 0.44–1.00)
GFR, Estimated: 8 mL/min — ABNORMAL LOW (ref >60)
Glucose, Bld: 143 mg/dL — ABNORMAL HIGH (ref 70–99)
Potassium: 4.5 mmol/L (ref 3.5–5.1)
Sodium: 135 mmol/L (ref 135–145)
Total Bilirubin: 0.5 mg/dL (ref 0.3–1.2)
Total Protein: 6.8 g/dL (ref 6.5–8.1)

## 2022-09-30 LAB — LACTIC ACID, PLASMA
Lactic Acid, Venous: 0.9 mmol/L (ref 0.5–1.9)
Lactic Acid, Venous: 0.7 mmol/L (ref 0.5–1.9)

## 2022-09-30 LAB — LIPASE, BLOOD: Lipase: 34 U/L (ref 11–51)

## 2022-09-30 MED ORDER — SODIUM CHLORIDE 0.9 % IV SOLN: INTRAVENOUS | Status: DC

## 2022-09-30 MED ORDER — SODIUM CHLORIDE 0.9 % IV BOLUS
500.0000 mL | Freq: Once | INTRAVENOUS | Status: AC
  Administered 2022-09-30: 500 mL via INTRAVENOUS

## 2022-09-30 NOTE — ED Notes (Signed)
Accepted to [facility]

## 2022-09-30 NOTE — ED Notes (Signed)
[facility], [facility] unable to transport, called [facility] for transport  1230

## 2022-09-30 NOTE — ED Provider Notes (Signed)
[facility] Provider Note    Event Date/Time   First MD Initiated Contact with Patient 09/30/22 [phone]     (approximate)   History   Abdominal Pain   HPI  Alicia Rogers is a 62 y.o. female with a history of type 2 diabetes, CKD, MGUS, anemia of chronic disease, iron deficiency anemia, and paranoid schizophrenia who presents with abdominal distention and pain over the last day.  The patient states that the pain is all over her abdomen.  She states she feels slightly sick on her stomach but did not vomit.  She had a normal bowel movement yesterday.  She has not had any diarrhea.  The patient had a recent hysterectomy.  I reviewed the medical records but the patient was admitted last month.  Per the hospitalist discharge note from 1/30 when she presented with cough and shortness of breath and was treated for multifocal pneumonia due to RSV.  The patient subsequently had a vaginal hysterectomy on 2/19 at [facility] which was uncomplicated.   Physical Exam   Triage Vital Signs: ED Triage Vitals  Enc Vitals Group     BP      Pulse      Resp      Temp      Temp src      SpO2      Weight      Height      Head Circumference      Peak Flow      Pain Score      Pain Loc      Pain Edu?      Excl. in [place]?     Most recent vital signs: Vitals:   09/30/22 1030 09/30/22 1245  BP: (!) 141/55 (!) 138/59  Pulse: 81 80  Resp:  20  Temp:  100.2 F (37.9 C)  SpO2: 96% 99%     General: Awake, no distress.  CV:  Good peripheral perfusion.  Resp:  Normal effort.  Abd:  Soft with mild diffuse tenderness.  Slight distention. Other:  Dry mucous membranes.   ED Results / Procedures / Treatments   Labs (all labs ordered are listed, but only abnormal results are displayed) Labs Reviewed  CBC WITH DIFFERENTIAL/PLATELET - Abnormal; Notable for the following components:      Result Value   WBC 13.8 (*)    RBC 2.75 (*)    Hemoglobin 7.8 (*)    HCT 25.7 (*)     Platelets 421 (*)    Neutro Abs 10.8 (*)    Monocytes Absolute 1.3 (*)    Abs Immature Granulocytes 0.15 (*)    All other components within normal limits  COMPREHENSIVE METABOLIC PANEL - Abnormal; Notable for the following components:   CO2 20 (*)    Glucose, Bld 143 (*)    BUN 70 (*)    Creatinine, Ser 5.67 (*)    Calcium 8.2 (*)    Albumin 2.3 (*)    Alkaline Phosphatase 134 (*)    GFR, Estimated 8 (*)    All other components within normal limits  LACTIC ACID, PLASMA  LACTIC ACID, PLASMA  LIPASE, BLOOD     EKG     RADIOLOGY  CT abdomen/pelvis: I independently viewed and interpreted the images; there is no intra-abdominal free air or free fluid.  Radiology report indicates an area suspicious for abscess in the surgical bed as well as findings of ileus versus early SBO.  PROCEDURES:  Critical  Care performed: No  Procedures   MEDICATIONS ORDERED IN ED: Medications  0.9 %  sodium chloride infusion (0 mLs Intravenous Stopped 09/30/22 1255)  sodium chloride 0.9 % bolus 500 mL (0 mLs Intravenous Stopped 09/30/22 0923)     IMPRESSION / MDM / ASSESSMENT AND PLAN / ED COURSE  I reviewed the triage vital signs and the nursing notes.  62 year old female with PMH as noted above presents with abdominal distention and pain.  The patient has a history of schizophrenia, has a somewhat flat affect, and is difficult to obtain detailed history from.  On exam her vital signs are normal and she has mild distention and mild diffuse tenderness of the abdomen but no peritoneal signs.  Differential diagnosis includes, but is not limited to, gastritis, gastroenteritis, ileus, SBO, colitis, diverticulitis.  I do not suspect perforation.  We will obtain lab workup, CT, and reassess.  Patient's presentation is most consistent with acute presentation with potential threat to life or bodily function.   ----------------------------------------- 11:55 AM on  09/30/2022 -----------------------------------------  CT shows an abscess in the surgical bed as well as possible ileus versus SBO.  At this time the patient is not vomiting and there is no indication for an NG tube.  Lab workup reveals elevated WBC count but normal lactate.  The patient also has some [name] with a creatinine elevated from baseline.  I contacted the [facility] transfer center, consulted and discussed the case with Dr. Rory Percy from OB/GYN there who did the surgery.  She recommends transfer to [facility] for further management.  I started the patient on maintenance fluids.  I reassessed the patient and informed her about the results of the workup and the treatment plan.  The patient is stable for transfer at this time.   FINAL CLINICAL IMPRESSION(S) / ED DIAGNOSES   Final diagnoses:  Postprocedural intraabdominal abscess     Rx / DC Orders   ED Discharge Orders     None        Note:  This document was prepared using Dragon voice recognition software and may include unintentional dictation errors.    Arta Silence, MD 09/30/22 [phone]

## 2022-09-30 NOTE — ED Notes (Signed)
Called [facility] for transfer  spoke to [place]

## 2022-09-30 NOTE — ED Notes (Signed)
Called CT  images powershared by Crystal to [id number]

## 2022-09-30 NOTE — ED Triage Notes (Signed)
Patient reports abdominal pain, distention and N/V; denies diarrhea. Patient reports symptoms started after hysterectomy on 2/19.

## 2022-10-13 ENCOUNTER — Encounter: Payer: Self-pay | Admitting: Internal Medicine

## 2022-10-28 ENCOUNTER — Ambulatory Visit (INDEPENDENT_AMBULATORY_CARE_PROVIDER_SITE_OTHER): Payer: 59 | Admitting: Podiatry

## 2022-10-28 ENCOUNTER — Encounter: Payer: Self-pay | Admitting: Podiatry

## 2022-10-28 DIAGNOSIS — M79675 Pain in left toe(s): Secondary | ICD-10-CM

## 2022-10-28 DIAGNOSIS — M79674 Pain in right toe(s): Secondary | ICD-10-CM

## 2022-10-28 DIAGNOSIS — N184 Chronic kidney disease, stage 4 (severe): Secondary | ICD-10-CM | POA: Diagnosis not present

## 2022-10-28 DIAGNOSIS — E1122 Type 2 diabetes mellitus with diabetic chronic kidney disease: Secondary | ICD-10-CM | POA: Diagnosis not present

## 2022-10-28 DIAGNOSIS — B351 Tinea unguium: Secondary | ICD-10-CM

## 2022-10-28 NOTE — Progress Notes (Signed)
This patient returns to my office for at risk foot care.  This patient requires this care by a professional since this patient will be at risk due to having diabetes and CKD.  This patient is unable to cut nails herself since the patient cannot reach her nails.These nails are painful walking and wearing shoes.  This patient presents for at risk foot care today.  General Appearance  Alert, conversant and in no acute stress.  Vascular  Dorsalis pedis and posterior tibial  pulses are palpable  bilaterally.  Capillary return is within normal limits  bilaterally. Temperature is within normal limits  bilaterally.  Neurologic  Senn-Weinstein monofilament wire test within normal limits  bilaterally. Muscle power within normal limits bilaterally.  Nails Thick disfigured discolored nails with subungual debris  from hallux to fifth toes bilaterally. No evidence of bacterial infection or drainage bilaterally.  Orthopedic  No limitations of motion  feet .  No crepitus or effusions noted.  No bony pathology or digital deformities noted.  Skin  normotropic skin with no porokeratosis noted bilaterally.  No signs of infections or ulcers noted.     Onychomycosis  Pain in right toes  Pain in left toes  Consent was obtained for treatment procedures.   Mechanical debridement of nails 1-5  bilaterally performed with a nail nipper.  Filed with dremel without incident. Nail polish was on her nails so dremel tool was used sparingly.   Return office visit     3 mos                Told patient to return for periodic foot care and evaluation due to potential at risk complications.   Khaliel Morey DPM   

## 2022-11-02 ENCOUNTER — Other Ambulatory Visit: Payer: Self-pay | Admitting: *Deleted

## 2022-11-02 DIAGNOSIS — D472 Monoclonal gammopathy: Secondary | ICD-10-CM

## 2022-11-02 DIAGNOSIS — D649 Anemia, unspecified: Secondary | ICD-10-CM

## 2022-11-03 ENCOUNTER — Inpatient Hospital Stay (HOSPITAL_BASED_OUTPATIENT_CLINIC_OR_DEPARTMENT_OTHER): Payer: 59 | Admitting: Internal Medicine

## 2022-11-03 ENCOUNTER — Inpatient Hospital Stay: Payer: 59

## 2022-11-03 ENCOUNTER — Encounter: Payer: Self-pay | Admitting: Internal Medicine

## 2022-11-03 ENCOUNTER — Inpatient Hospital Stay: Payer: 59 | Attending: Internal Medicine

## 2022-11-03 VITALS — BP 144/54 | HR 63 | Temp 95.2°F | Resp 15 | Ht 63.0 in | Wt 114.3 lb

## 2022-11-03 DIAGNOSIS — Z09 Encounter for follow-up examination after completed treatment for conditions other than malignant neoplasm: Secondary | ICD-10-CM | POA: Insufficient documentation

## 2022-11-03 DIAGNOSIS — N1832 Chronic kidney disease, stage 3b: Secondary | ICD-10-CM

## 2022-11-03 DIAGNOSIS — D649 Anemia, unspecified: Secondary | ICD-10-CM | POA: Diagnosis not present

## 2022-11-03 DIAGNOSIS — D472 Monoclonal gammopathy: Secondary | ICD-10-CM

## 2022-11-03 DIAGNOSIS — D631 Anemia in chronic kidney disease: Secondary | ICD-10-CM | POA: Insufficient documentation

## 2022-11-03 DIAGNOSIS — N184 Chronic kidney disease, stage 4 (severe): Secondary | ICD-10-CM | POA: Insufficient documentation

## 2022-11-03 LAB — CMP (CANCER CENTER ONLY)
ALT: 16 U/L (ref 0–44)
AST: 24 U/L (ref 15–41)
Albumin: 3 g/dL — ABNORMAL LOW (ref 3.5–5.0)
Alkaline Phosphatase: 88 U/L (ref 38–126)
Anion gap: 11 (ref 5–15)
BUN: 80 mg/dL — ABNORMAL HIGH (ref 8–23)
CO2: 18 mmol/L — ABNORMAL LOW (ref 22–32)
Calcium: 9.4 mg/dL (ref 8.9–10.3)
Chloride: 107 mmol/L (ref 98–111)
Creatinine: 4.1 mg/dL — ABNORMAL HIGH (ref 0.44–1.00)
GFR, Estimated: 12 mL/min — ABNORMAL LOW (ref >60)
Glucose, Bld: 239 mg/dL — ABNORMAL HIGH (ref 70–99)
Potassium: 4.6 mmol/L (ref 3.5–5.1)
Sodium: 136 mmol/L (ref 135–145)
Total Bilirubin: 0.1 mg/dL — ABNORMAL LOW (ref 0.3–1.2)
Total Protein: 7.3 g/dL (ref 6.5–8.1)

## 2022-11-03 LAB — CBC WITH DIFFERENTIAL (CANCER CENTER ONLY)
Abs Immature Granulocytes: 0.1 10*3/uL — ABNORMAL HIGH (ref 0.00–0.07)
Basophils Absolute: 0 10*3/uL (ref 0.0–0.1)
Basophils Relative: 0 %
Eosinophils Absolute: 0.1 10*3/uL (ref 0.0–0.5)
Eosinophils Relative: 1 %
HCT: 31.1 % — ABNORMAL LOW (ref 36.0–46.0)
Hemoglobin: 9.3 g/dL — ABNORMAL LOW (ref 12.0–15.0)
Immature Granulocytes: 1 %
Lymphocytes Relative: 26 %
Lymphs Abs: 3.2 10*3/uL (ref 0.7–4.0)
MCH: 29.1 pg (ref 26.0–34.0)
MCHC: 29.9 g/dL — ABNORMAL LOW (ref 30.0–36.0)
MCV: 97.2 fL (ref 80.0–100.0)
Monocytes Absolute: 0.9 10*3/uL (ref 0.1–1.0)
Monocytes Relative: 7 %
Neutro Abs: 8.4 10*3/uL — ABNORMAL HIGH (ref 1.7–7.7)
Neutrophils Relative %: 65 %
Platelet Count: 319 10*3/uL (ref 150–400)
RBC: 3.2 MIL/uL — ABNORMAL LOW (ref 3.87–5.11)
RDW: 14 % (ref 11.5–15.5)
WBC Count: 12.6 10*3/uL — ABNORMAL HIGH (ref 4.0–10.5)
nRBC: 0 % (ref 0.0–0.2)

## 2022-11-03 LAB — FERRITIN: Ferritin: 356 ng/mL — ABNORMAL HIGH (ref 11–307)

## 2022-11-03 LAB — IRON AND TIBC
Iron: 71 ug/dL (ref 28–170)
Saturation Ratios: 34 % — ABNORMAL HIGH (ref 10.4–31.8)
TIBC: 207 ug/dL — ABNORMAL LOW (ref 250–450)
UIBC: 136 ug/dL

## 2022-11-03 MED ORDER — EPOETIN ALFA-EPBX 10000 UNIT/ML IJ SOLN
20000.0000 [IU] | Freq: Once | INTRAMUSCULAR | Status: AC
  Administered 2022-11-03: 20000 [IU] via SUBCUTANEOUS

## 2022-11-03 MED ORDER — NICOTINE 21 MG/24HR TD PT24: 21.0000 mg | MEDICATED_PATCH | Freq: Every day | TRANSDERMAL | 4 refills | Status: DC

## 2022-11-03 MED FILL — Iron Sucrose Inj 20 MG/ML (Fe Equiv): INTRAVENOUS | Qty: 10 | Status: AC

## 2022-11-03 NOTE — Addendum Note (Signed)
Addended by: Sofie Rower A on: 11/03/2022 02:27 PM   Modules accepted: Orders

## 2022-11-03 NOTE — Progress Notes (Signed)
Patients care giver informed me that Alicia Rogers had a hysterectomy on the 19th of February 2024. She had and abscess after her procedure and was admitted to the hospital for a  week. She has since recovered well.   Caregiver states that Alicia Andreoni is having loose stools for a couple of days. She thinks it may be from something being passed around in the facility.

## 2022-11-03 NOTE — Progress Notes (Signed)
Alicia Rogers NOTE  Patient Care Team: Mechele Claude, FNP as PCP - General (Family Medicine) Cammie Sickle, MD as Consulting Physician (Oncology)  CHIEF COMPLAINTS/PURPOSE OF CONSULTATION: Monoclonal gammopathy   #  JULY 2023-Anemia-8- 9-secondary to CKD/iron deficiency.[colo-2014] Plan IV iron infusion today- ferritin-12; iron sat 16%.   IV iron infusion. EGD/colonoscopy- DEC 2023- [Dr.Vanga]-colonoscopy EGD; recommended polypectomy.     A poorly-defined band of restricted protein mobility is detected       in the gamma globulins. It is unlikely that this may represent a       monoclonal protein; however, immunofixation analysis is available       if clinically indicated.  150.7 High      Free Lambda Lt Chains, S 64.0 High     Free Kappa/Lambda Ratio 2.35 High      Component    Free Kappa Lt Chains, S  Free Lambda Lt Chains, S  Free Kappa/Lambda Ratio    # CKD stage-IV Dr.Korrapati]; schizoaffective disorder [Psyche;?] ; DM- on insulin.   HISTORY OF PRESENTING ILLNESS: Accompanied by caregiver.  Ambulating independently. Poor historian.   Alicia Rogers 62 y.o.  female with schizoaffective disorder with CKD; anemia; possible monoclonal gammopathy is here for follow-up.   In the interim patient underwent TAH at [facility] in February.  Patient had hysterectomy done for fibroids/vaginal bleeding.   Chronic joint pains however no new onset of bone pain.  Chronic mild fatigue.   Denies any nausea vomiting.  Complains of chronic leg swelling.  Not any worse.  Review of Systems  Constitutional:  Negative for chills, diaphoresis, fever, malaise/fatigue and weight loss.  HENT:  Negative for nosebleeds and sore throat.   Eyes:  Negative for double vision.  Respiratory:  Negative for cough, hemoptysis, sputum production, shortness of breath and wheezing.   Cardiovascular:  Positive for leg swelling. Negative for chest pain, palpitations and orthopnea.   Gastrointestinal:  Negative for abdominal pain, blood in stool, constipation, diarrhea, heartburn, melena, nausea and vomiting.  Genitourinary:  Negative for dysuria, frequency and urgency.  Musculoskeletal:  Positive for back pain and joint pain.  Skin: Negative.  Negative for itching and rash.  Neurological:  Negative for dizziness, tingling, focal weakness, weakness and headaches.  Endo/Heme/Allergies:  Does not bruise/bleed easily.  Psychiatric/Behavioral:  Negative for depression. The patient is not nervous/anxious and does not have insomnia.     MEDICAL HISTORY:  Past Medical History:  Diagnosis Date   Depression    Diabetes mellitus without complication    Diabetes mellitus, type II    Hand swelling 09/27/2021   Hypertension    Schizophrenia    Stage 3b chronic kidney disease 12/22/2021    SURGICAL HISTORY: Past Surgical History:  Procedure Laterality Date   COLONOSCOPY WITH PROPOFOL N/A 07/21/2022   Procedure: COLONOSCOPY WITH PROPOFOL;  Surgeon: Lin Landsman, MD;  Location: [facility] ENDOSCOPY;  Service: Gastroenterology;  Laterality: N/A;   colonscopy     ESOPHAGOGASTRODUODENOSCOPY (EGD) WITH PROPOFOL N/A 07/21/2022   Procedure: ESOPHAGOGASTRODUODENOSCOPY (EGD) WITH PROPOFOL;  Surgeon: Lin Landsman, MD;  Location: [facility] ENDOSCOPY;  Service: Gastroenterology;  Laterality: N/A;    SOCIAL HISTORY: Social History   Socioeconomic History   Marital status: Single    Spouse name: Not on file   Number of children: Not on file   Years of education: Not on file   Highest education level: Not on file  Occupational History   Not on file  Tobacco Use  Smoking status: Every Day    Packs/day: 1    Types: Cigarettes    Start date: 04/21/1978   Smokeless tobacco: Never  Vaping Use   Vaping Use: Former  Substance and Sexual Activity   Alcohol use: No    Alcohol/week: 0.0 standard drinks of alcohol   Drug use: No   Sexual activity: Not Currently  Other Topics  Concern   Not on file  Social History Narrative   Not on file   Social Determinants of Health   Financial Resource Strain: Not on file  Food Insecurity: No Food Insecurity (08/30/2022)   Hunger Vital Sign    Worried About Running Out of Food in the Last Year: Never true    Ran Out of Food in the Last Year: Never true  Transportation Needs: No Transportation Needs (08/30/2022)   PRAPARE - Hydrologist (Medical): No    Lack of Transportation (Non-Medical): No  Physical Activity: Not on file  Stress: Not on file  Social Connections: Not on file  Intimate Partner Violence: Not At Risk (08/30/2022)   Humiliation, Afraid, Rape, and Kick questionnaire    Fear of Current or Ex-Partner: No    Emotionally Abused: No    Physically Abused: No    Sexually Abused: No    FAMILY HISTORY: Family History  Problem Relation Age of Onset   Diabetes Mother    Glaucoma Mother    Heart disease Mother    Heart attack Mother    Depression Mother    Hypertension Mother    Diabetes Father    Glaucoma Father    Hypertension Father    Prostate cancer Father    Diabetes Sister    Depression Sister    Post-traumatic stress disorder Sister     ALLERGIES:  is allergic to penicillins and lisinopril.  MEDICATIONS:  Current Outpatient Medications  Medication Sig Dispense Refill   aspirin EC 81 MG tablet Take 81 mg by mouth daily.     BREZTRI AEROSPHERE 160-9-4.8 MCG/ACT AERO Inhale 2 puffs into the lungs in the morning and at bedtime. Start taking after antibiotics are completed. 10.7 g 11   calcitRIOL (ROCALTROL) 0.25 MCG capsule Take 0.25 mcg by mouth daily.     nicotine (NICODERM CQ) 21 mg/24hr patch Place 1 patch (21 mg total) onto the skin daily. 28 patch 4   cyclobenzaprine (FLEXERIL) 10 MG tablet Take 1 tablet (10 mg total) by mouth at bedtime. 30 tablet 0   dapagliflozin propanediol (FARXIGA) 5 MG TABS tablet Take 5 mg by mouth daily.     diltiazem (CARDIZEM CD)  360 MG 24 hr capsule Take 1 capsule (360 mg total) by mouth daily. 30 capsule 3   ergocalciferol (VITAMIN D2) 1.25 MG (50000 UT) capsule Take 50,000 Units by mouth once a week. On Fridays     escitalopram (LEXAPRO) 20 MG tablet Take 20 mg by mouth daily.     ferrous sulfate 325 (65 FE) MG tablet Take 325 mg by mouth 3 (three) times daily with meals.     fluticasone (FLONASE) 50 MCG/ACT nasal spray Place 2 sprays into both nostrils daily. 48 mL 3   furosemide (LASIX) 20 MG tablet Take 20 mg by mouth daily.     gabapentin (NEURONTIN) 100 MG capsule Take 100 mg by mouth 3 (three) times daily.     guaiFENesin-dextromethorphan (ROBITUSSIN DM) 100-10 MG/5ML syrup Take 5 mLs by mouth every 6 (six) hours as needed for cough. Fifty-Six  mL 0   hydrALAZINE (APRESOLINE) 50 MG tablet Take 1 tablet (50 mg total) by mouth 3 (three) times daily.     INVEGA SUSTENNA injection SMARTSIG:117 Milligram(s) IM Once a Month     linagliptin (TRADJENTA) 5 MG TABS tablet Take 5 mg by mouth daily.     loperamide (IMODIUM) 2 MG capsule Take 2 mg by mouth 2 (two) times daily as needed for diarrhea or loose stools.     losartan (COZAAR) 100 MG tablet Take 100 mg by mouth daily.     melatonin 3 MG TABS tablet Take 6 mg by mouth at bedtime.     multivitamin (RENA-VIT) TABS tablet Take 1 tablet by mouth daily.     omeprazole (PRILOSEC) 40 MG capsule Take 40 mg by mouth daily.     oxybutynin (DITROPAN XL) 15 MG 24 hr tablet TAKE 1 TABLET BY MOUTH ONCE DAILY FOR BLADDER *WEAR GLOVES IF CRUSHING*     polyethylene glycol powder (GLYCOLAX/MIRALAX) 17 GM/SCOOP powder Take by mouth.     traMADol (ULTRAM) 50 MG tablet Take 1 tablet (50 mg total) by mouth 2 (two) times daily as needed for moderate pain. 15 tablet 0   No current facility-administered medications for this visit.   Facility-Administered Medications Ordered in Other Visits  Medication Dose Route Frequency Provider Last Rate Last Admin   epoetin alfa-epbx (RETACRIT) injection  20,000 Units  20,000 Units Subcutaneous Once Charlaine Dalton R, MD          PHYSICAL EXAMINATION:   Vitals:   11/03/22 1306  BP: (!) 144/54  Pulse: 63  Resp: 15  Temp: (!) 95.2 F (35.1 C)  SpO2: 100%    Filed Weights   11/03/22 1306  Weight: 114 lb 4.8 oz (51.8 kg)     Physical Exam Vitals and nursing note reviewed.  HENT:     Head: Normocephalic and atraumatic.     Mouth/Throat:     Pharynx: Oropharynx is clear.  Eyes:     Extraocular Movements: Extraocular movements intact.     Pupils: Pupils are equal, round, and reactive to light.  Cardiovascular:     Rate and Rhythm: Normal rate and regular rhythm.  Pulmonary:     Comments: Decreased breath sounds bilaterally.  Abdominal:     Palpations: Abdomen is soft.  Musculoskeletal:        General: Normal range of motion.     Cervical back: Normal range of motion.  Skin:    General: Skin is warm.  Neurological:     General: No focal deficit present.     Mental Status: She is alert and oriented to person, place, and time.  Psychiatric:        Behavior: Behavior normal.        Judgment: Judgment normal.     LABORATORY DATA:  I have reviewed the data as listed Lab Results  Component Value Date   WBC 12.6 (H) 11/03/2022   HGB 9.3 (L) 11/03/2022   HCT 31.1 (L) 11/03/2022   MCV 97.2 11/03/2022   PLT 319 11/03/2022   Recent Labs    08/31/22 0358 09/03/22 1025 09/30/22 0904 11/03/22 1256  NA 137 140 135 136  K 4.9 4.6 4.5 4.6  CL 109 107* 102 107  CO2 18* 17* 20* 18*  GLUCOSE 189* 192* 143* 239*  BUN 59* 69* 70* 80*  CREATININE 3.67* 3.72* 5.67* 4.10*  CALCIUM 9.2 9.1 8.2* 9.4  GFRNONAA 13*  --  8* 12*  PROT  --  6.7 6.8 7.3  ALBUMIN  --  3.6* 2.3* 3.0*  AST  --  22 16 24   ALT  --  33* 10 16  ALKPHOS  --  113 134* 88  BILITOT  --  <0.2 0.5 0.1*     No results found.  Lab Results  Component Value Date   KPAFRELGTCHN 163.8 (H) 02/05/2022   LAMBDASER 62.2 (H) 02/05/2022   KAPLAMBRATIO  2.63 (H) 02/05/2022     Symptomatic anemia # Anemia-8- 9-[JUNE 2023]secondary to CKD- ferritin-12; iron sat 16%. S/p venofer. Proceed with retacrit.   # Etiology of Anemia: likely CKD;  December 2023. Dr.Vanga- [colo/EGD- dec 2023] awaiting flexible sigmoidoscopy for polypectomy. Defer to GI.  To visualize the small bowel, perform video capsule endoscopy at appointment to be scheduled.  # TAH [[facility] feb 2024]-   # #July 2023 -repeat M protein -negative ; kappa lambda light chain ratio 2.63.  Suspect secondary to CKD.   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 Refer to nutrition re: renal diet.   # active smoker: Discussed with the patient regarding the ill effects of smoking- including but not limited to cardiac lung and vascular diseases and malignancies. Counseled against smoking; patient- interested. Recommend nicotine patches.     # DISPOSITION:  #  NO venofer today; today- Retacrit # follow up in 1 month- MD; labs- cbc/cbmp; possible IV iron infusion OR retacrit- -Dr.B  All questions were answered. The patient knows to call the clinic with any problems, questions or concerns.      Cammie Sickle, MD 11/03/2022 1:52 PM

## 2022-11-03 NOTE — Progress Notes (Signed)
NO iron today per MD

## 2022-11-03 NOTE — Assessment & Plan Note (Addendum)
#   Anemia-8- 9-[JUNE 2023]secondary to CKD- ferritin-12; iron sat 16%. S/p venofer.-Hemoglobin improved at 9.3.  Hold off Venofer today.  Awaiting iron studies.  Proceed with retacrit.   # Etiology of Anemia: Multifactorial likely CKD;  December 2023. Dr.Vanga- [colo/EGD- dec 2023] awaiting flexible sigmoidoscopy for polypectomy. Defer to GI.  T? video capsule endoscopy.  Question secondary to vaginal bleeding.  S/p TAH- feb 2024 [[facility]]  # #July 2023 -repeat M protein -negative ; kappa lambda light chain ratio 2.63.  Suspect secondary to CKD.   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  Overall stable.  # active smoker: Discussed with the patient regarding the ill effects of smoking- including but not limited to cardiac lung and vascular diseases and malignancies. Counseled against smoking; patient- interested. Recommend nicotine patches.    # DISPOSITION:  #  NO venofer today; today- Retacrit # follow up in 1 month- MD; labs- cbc/cbmp; possible IV iron infusion OR retacrit- -Dr.B

## 2022-11-30 MED FILL — Iron Sucrose Inj 20 MG/ML (Fe Equiv): INTRAVENOUS | Qty: 10 | Status: AC

## 2022-12-01 ENCOUNTER — Encounter: Payer: Self-pay | Admitting: Internal Medicine

## 2022-12-01 ENCOUNTER — Inpatient Hospital Stay (HOSPITAL_BASED_OUTPATIENT_CLINIC_OR_DEPARTMENT_OTHER): Payer: 59 | Admitting: Internal Medicine

## 2022-12-01 ENCOUNTER — Inpatient Hospital Stay: Payer: 59 | Attending: Internal Medicine

## 2022-12-01 ENCOUNTER — Inpatient Hospital Stay: Payer: 59

## 2022-12-01 VITALS — BP 133/58 | HR 60 | Temp 94.4°F | Ht 63.0 in | Wt 122.0 lb

## 2022-12-01 DIAGNOSIS — D631 Anemia in chronic kidney disease: Secondary | ICD-10-CM | POA: Diagnosis present

## 2022-12-01 DIAGNOSIS — D649 Anemia, unspecified: Secondary | ICD-10-CM

## 2022-12-01 DIAGNOSIS — N184 Chronic kidney disease, stage 4 (severe): Secondary | ICD-10-CM | POA: Insufficient documentation

## 2022-12-01 LAB — CBC WITH DIFFERENTIAL (CANCER CENTER ONLY)
Abs Immature Granulocytes: 0.05 10*3/uL (ref 0.00–0.07)
Basophils Absolute: 0 10*3/uL (ref 0.0–0.1)
Basophils Relative: 0 %
Eosinophils Absolute: 0 10*3/uL (ref 0.0–0.5)
Eosinophils Relative: 0 %
HCT: 33.8 % — ABNORMAL LOW (ref 36.0–46.0)
Hemoglobin: 10.1 g/dL — ABNORMAL LOW (ref 12.0–15.0)
Immature Granulocytes: 1 %
Lymphocytes Relative: 28 %
Lymphs Abs: 3.1 10*3/uL (ref 0.7–4.0)
MCH: 28.6 pg (ref 26.0–34.0)
MCHC: 29.9 g/dL — ABNORMAL LOW (ref 30.0–36.0)
MCV: 95.8 fL (ref 80.0–100.0)
Monocytes Absolute: 0.8 10*3/uL (ref 0.1–1.0)
Monocytes Relative: 7 %
Neutro Abs: 7 10*3/uL (ref 1.7–7.7)
Neutrophils Relative %: 64 %
Platelet Count: 335 10*3/uL (ref 150–400)
RBC: 3.53 MIL/uL — ABNORMAL LOW (ref 3.87–5.11)
RDW: 14 % (ref 11.5–15.5)
WBC Count: 10.9 10*3/uL — ABNORMAL HIGH (ref 4.0–10.5)
nRBC: 0.2 % (ref 0.0–0.2)

## 2022-12-01 LAB — CMP (CANCER CENTER ONLY)
ALT: 19 U/L (ref 0–44)
AST: 17 U/L (ref 15–41)
Albumin: 3.3 g/dL — ABNORMAL LOW (ref 3.5–5.0)
Alkaline Phosphatase: 99 U/L (ref 38–126)
Anion gap: 13 (ref 5–15)
BUN: 88 mg/dL — ABNORMAL HIGH (ref 8–23)
CO2: 19 mmol/L — ABNORMAL LOW (ref 22–32)
Calcium: 9.9 mg/dL (ref 8.9–10.3)
Chloride: 102 mmol/L (ref 98–111)
Creatinine: 3.78 mg/dL — ABNORMAL HIGH (ref 0.44–1.00)
GFR, Estimated: 13 mL/min — ABNORMAL LOW (ref >60)
Glucose, Bld: 140 mg/dL — ABNORMAL HIGH (ref 70–99)
Potassium: 4.5 mmol/L (ref 3.5–5.1)
Sodium: 134 mmol/L — ABNORMAL LOW (ref 135–145)
Total Bilirubin: 0.3 mg/dL (ref 0.3–1.2)
Total Protein: 7.5 g/dL (ref 6.5–8.1)

## 2022-12-01 NOTE — Progress Notes (Signed)
[facility] CONSULT NOTE  Patient Care Team: Entzminger, Danton Clap, MD as PCP - General (Internal Medicine) Earna Coder, MD as Consulting Physician (Oncology)  CHIEF COMPLAINTS/PURPOSE OF CONSULTATION: Monoclonal gammopathy   #  JULY 2023-Anemia-8- 9-secondary to CKD/iron deficiency.[colo-2014] Plan IV iron infusion today- ferritin-12; iron sat 16%.   IV iron infusion. EGD/colonoscopy- DEC 2023- [Dr.Vanga]-colonoscopy EGD; recommended polypectomy.     A poorly-defined band of restricted protein mobility is detected       in the gamma globulins. It is unlikely that this may represent a       monoclonal protein; however, immunofixation analysis is available       if clinically indicated.  150.7 High      Free Lambda Lt Chains, S 64.0 High     Free Kappa/Lambda Ratio 2.35 High      Component    Free Kappa Lt Chains, S  Free Lambda Lt Chains, S  Free Kappa/Lambda Ratio    # CKD stage-IV Dr.Korrapati]; schizoaffective disorder [Psyche;?] ; DM- on insulin.   HISTORY OF PRESENTING ILLNESS: Accompanied by caregiver.  Ambulating independently. Poor historian.   Alicia Rogers 62 y.o.  female with schizoaffective disorder with CKD; anemia; possible monoclonal gammopathy is here for follow-up.   Denies any worsening fatigue.  Denies any blood in stools or black-colored stool.   Chronic joint pains however no new onset of bone pain.  Chronic mild fatigue.   Denies any nausea vomiting.  Complains of chronic leg swelling.  Not any worse.  Review of Systems  Constitutional:  Negative for chills, diaphoresis, fever, malaise/fatigue and weight loss.  HENT:  Negative for nosebleeds and sore throat.   Eyes:  Negative for double vision.  Respiratory:  Negative for cough, hemoptysis, sputum production, shortness of breath and wheezing.   Cardiovascular:  Positive for leg swelling. Negative for chest pain, palpitations and orthopnea.  Gastrointestinal:  Negative  for abdominal pain, blood in stool, constipation, diarrhea, heartburn, melena, nausea and vomiting.  Genitourinary:  Negative for dysuria, frequency and urgency.  Musculoskeletal:  Positive for back pain and joint pain.  Skin: Negative.  Negative for itching and rash.  Neurological:  Negative for dizziness, tingling, focal weakness, weakness and headaches.  Endo/Heme/Allergies:  Does not bruise/bleed easily.  Psychiatric/Behavioral:  Negative for depression. The patient is not nervous/anxious and does not have insomnia.     MEDICAL HISTORY:  Past Medical History:  Diagnosis Date   Depression    Diabetes mellitus without complication (HCC)    Diabetes mellitus, type II (HCC)    Hand swelling 09/27/2021   Hypertension    Schizophrenia (HCC)    Stage 3b chronic kidney disease (HCC) 12/22/2021    SURGICAL HISTORY: Past Surgical History:  Procedure Laterality Date   COLONOSCOPY WITH PROPOFOL N/A 07/21/2022   Procedure: COLONOSCOPY WITH PROPOFOL;  Surgeon: Toney Reil, MD;  Location: [facility] ENDOSCOPY;  Service: Gastroenterology;  Laterality: N/A;   colonscopy     ESOPHAGOGASTRODUODENOSCOPY (EGD) WITH PROPOFOL N/A 07/21/2022   Procedure: ESOPHAGOGASTRODUODENOSCOPY (EGD) WITH PROPOFOL;  Surgeon: Toney Reil, MD;  Location: [facility] ENDOSCOPY;  Service: Gastroenterology;  Laterality: N/A;    SOCIAL HISTORY: Social History   Socioeconomic History   Marital status: Single    Spouse name: Not on file   Number of children: Not on file   Years of education: Not on file   Highest education level: Not on file  Occupational History   Not on file  Tobacco Use  Smoking status: Every Day    Packs/day: 1    Types: Cigarettes    Start date: 04/21/1978   Smokeless tobacco: Never  Vaping Use   Vaping Use: Former  Substance and Sexual Activity   Alcohol use: No    Alcohol/week: 0.0 standard drinks of alcohol   Drug use: No   Sexual activity: Not Currently  Other Topics Concern    Not on file  Social History Narrative   Not on file   Social Determinants of Health   Financial Resource Strain: Not on file  Food Insecurity: No Food Insecurity (08/30/2022)   Hunger Vital Sign    Worried About Running Out of Food in the Last Year: Never true    Ran Out of Food in the Last Year: Never true  Transportation Needs: No Transportation Needs (08/30/2022)   PRAPARE - Administrator, Civil Service (Medical): No    Lack of Transportation (Non-Medical): No  Physical Activity: Not on file  Stress: Not on file  Social Connections: Not on file  Intimate Partner Violence: Not At Risk (08/30/2022)   Humiliation, Afraid, Rape, and Kick questionnaire    Fear of Current or Ex-Partner: No    Emotionally Abused: No    Physically Abused: No    Sexually Abused: No    FAMILY HISTORY: Family History  Problem Relation Age of Onset   Diabetes Mother    Glaucoma Mother    Heart disease Mother    Heart attack Mother    Depression Mother    Hypertension Mother    Diabetes Father    Glaucoma Father    Hypertension Father    Prostate cancer Father    Diabetes Sister    Depression Sister    Post-traumatic stress disorder Sister     ALLERGIES:  is allergic to penicillins and lisinopril.  MEDICATIONS:  Current Outpatient Medications  Medication Sig Dispense Refill   aspirin EC 81 MG tablet Take 81 mg by mouth daily.     BREZTRI AEROSPHERE 160-9-4.8 MCG/ACT AERO Inhale 2 puffs into the lungs in the morning and at bedtime. Start taking after antibiotics are completed. 10.7 g 11   calcitRIOL (ROCALTROL) 0.25 MCG capsule Take 0.25 mcg by mouth daily.     cyclobenzaprine (FLEXERIL) 10 MG tablet Take 1 tablet (10 mg total) by mouth at bedtime. (Patient not taking: Reported on 11/03/2022) 30 tablet 0   dapagliflozin propanediol (FARXIGA) 5 MG TABS tablet Take 5 mg by mouth daily.     diltiazem (CARDIZEM CD) 360 MG 24 hr capsule Take 1 capsule (360 mg total) by mouth daily. 30  capsule 3   ergocalciferol (VITAMIN D2) 1.25 MG (50000 UT) capsule Take 50,000 Units by mouth once a week. On Fridays     escitalopram (LEXAPRO) 20 MG tablet Take 20 mg by mouth daily.     ferrous sulfate 325 (65 FE) MG tablet Take 325 mg by mouth 3 (three) times daily with meals.     fluticasone (FLONASE) 50 MCG/ACT nasal spray Place 2 sprays into both nostrils daily. 48 mL 3   furosemide (LASIX) 20 MG tablet Take 20 mg by mouth daily.     gabapentin (NEURONTIN) 100 MG capsule Take 100 mg by mouth 3 (three) times daily.     guaiFENesin-dextromethorphan (ROBITUSSIN DM) 100-10 MG/5ML syrup Take 5 mLs by mouth every 6 (six) hours as needed for cough. 118 mL 0   hydrALAZINE (APRESOLINE) 50 MG tablet Take 1 tablet (50 mg total)  by mouth 3 (three) times daily.     hydrochlorothiazide (HYDRODIURIL) 25 MG tablet Take 25 mg by mouth daily.     INVEGA SUSTENNA injection SMARTSIG:117 Milligram(s) IM Once a Month     linagliptin (TRADJENTA) 5 MG TABS tablet Take 5 mg by mouth daily.     loperamide (IMODIUM) 2 MG capsule Take 2 mg by mouth 2 (two) times daily as needed for diarrhea or loose stools.     LORazepam (ATIVAN) 0.5 MG tablet Take 0.5 mg by mouth every 8 (eight) hours. TAKE ONE TABLET BY MOUTH TWICE A DAY FOR 14 DAYS     losartan (COZAAR) 100 MG tablet Take 100 mg by mouth daily.     melatonin 3 MG TABS tablet Take 6 mg by mouth at bedtime.     Multiple Vitamin (MULTIVITAMIN) capsule Take 1 capsule by mouth daily.     multivitamin (RENA-VIT) TABS tablet Take 1 tablet by mouth daily. (Patient not taking: Reported on 11/03/2022)     nicotine (NICODERM CQ) 21 mg/24hr patch Place 1 patch (21 mg total) onto the skin daily. 28 patch 4   omeprazole (PRILOSEC) 40 MG capsule Take 40 mg by mouth daily.     oxybutynin (DITROPAN XL) 15 MG 24 hr tablet TAKE 1 TABLET BY MOUTH ONCE DAILY FOR BLADDER *WEAR GLOVES IF CRUSHING*     polyethylene glycol powder (GLYCOLAX/MIRALAX) 17 GM/SCOOP powder Take by mouth.      traMADol (ULTRAM) 50 MG tablet Take 1 tablet (50 mg total) by mouth 2 (two) times daily as needed for moderate pain. 15 tablet 0   No current facility-administered medications for this visit.      PHYSICAL EXAMINATION:   Vitals:   12/01/22 1253  BP: (!) 133/58  Pulse: 60  Temp: (!) 94.4 F (34.7 C)  SpO2: 100%    Filed Weights   12/01/22 1253  Weight: 122 lb (55.3 kg)     Physical Exam Vitals and nursing note reviewed.  HENT:     Head: Normocephalic and atraumatic.     Mouth/Throat:     Pharynx: Oropharynx is clear.  Eyes:     Extraocular Movements: Extraocular movements intact.     Pupils: Pupils are equal, round, and reactive to light.  Cardiovascular:     Rate and Rhythm: Normal rate and regular rhythm.  Pulmonary:     Comments: Decreased breath sounds bilaterally.  Abdominal:     Palpations: Abdomen is soft.  Musculoskeletal:        General: Normal range of motion.     Cervical back: Normal range of motion.  Skin:    General: Skin is warm.  Neurological:     General: No focal deficit present.     Mental Status: She is alert and oriented to person, place, and time.  Psychiatric:        Behavior: Behavior normal.        Judgment: Judgment normal.     LABORATORY DATA:  I have reviewed the data as listed Lab Results  Component Value Date   WBC 10.9 (H) 12/01/2022   HGB 10.1 (L) 12/01/2022   HCT 33.8 (L) 12/01/2022   MCV 95.8 12/01/2022   PLT 335 12/01/2022   Recent Labs    09/30/22 0904 11/03/22 1256 12/01/22 1255  NA 135 136 134*  K 4.5 4.6 4.5  CL 102 107 102  CO2 20* 18* 19*  GLUCOSE 143* 239* 140*  BUN 70* 80* 88*  CREATININE 5.67* 4.10* 3.78*  CALCIUM 8.2* 9.4 9.9  GFRNONAA 8* 12* 13*  PROT 6.8 7.3 7.5  ALBUMIN 2.3* 3.0* 3.3*  AST 16 24 17   ALT 10 16 19   ALKPHOS 134* 88 99  BILITOT 0.5 0.1* 0.3     No results found.  Lab Results  Component Value Date   KPAFRELGTCHN 163.8 (H) 02/05/2022   LAMBDASER 62.2 (H) 02/05/2022    KAPLAMBRATIO 2.63 (H) 02/05/2022     Symptomatic anemia # Anemia-8- 9-[JUNE 2023]secondary to CKD-  # APRIL 2024- ferritin-356; iron sat 134%. S/p venofer.-Hemoglobin improved at 10.3  Hold off Venofer today.HOLD retacrit.   # Etiology of Anemia: Multifactorial likely CKD;  December 2023. Dr.Vanga- [colo/EGD- dec 2023] awaiting flexible sigmoidoscopy for polypectomy. Defer to GI.  T? video capsule endoscopy.  Question secondary to vaginal bleeding.  S/p TAH- feb 2024 [[facility]]  # #July 2023 -repeat M protein -negative ; kappa lambda light chain ratio 2.63.  Suspect secondary to CKD.   # Chronic kidney disease-IV / ? DM vs other- -followed by Dr.Korrapati- GFR 13- defer to nephrology- stable.   # DISPOSITION:  #  NO venofer today; NO Retacrit # 1 month- H&H- possible retacrit  # 2 month- H&H- possible retacrit  # follow up in 3  month- MD; labs- cbc/cbmp; possible IV iron infusion OR retacrit- -Dr.B  All questions were answered. The patient knows to call the clinic with any problems, questions or concerns.      Earna Coder, MD 12/01/2022 1:31 PM

## 2022-12-01 NOTE — Progress Notes (Signed)
Fatigue/weakness: yes/legs Dyspena: no Light headedness: no Blood in stool: no   No med list brought in with pt today.

## 2022-12-01 NOTE — Assessment & Plan Note (Addendum)
#   Anemia-8- 9-[JUNE 2023]secondary to CKD-  # APRIL 2024- ferritin-356; iron sat 134%. S/p venofer.-Hemoglobin improved at 10.3  Hold off Venofer today.HOLD retacrit.   # Etiology of Anemia: Multifactorial likely CKD;  December 2023. Dr.Vanga- [colo/EGD- dec 2023] awaiting flexible sigmoidoscopy for polypectomy. Defer to GI.  T? video capsule endoscopy.  Question secondary to vaginal bleeding.  S/p TAH- feb 2024 [[facility]]  # #July 2023 -repeat M protein -negative ; kappa lambda light chain ratio 2.63.  Suspect secondary to CKD.   # Chronic kidney disease-IV / ? DM vs other- -followed by Dr.Korrapati- GFR 13- defer to nephrology- stable.   # DISPOSITION:  #  NO venofer today; NO Retacrit # 1 month- H&H- possible retacrit  # 2 month- H&H- possible retacrit  # follow up in 3  month- MD; labs- cbc/cbmp;iron sudies; ferritin- possible IV iron infusion OR retacrit- -Dr.B

## 2022-12-02 ENCOUNTER — Ambulatory Visit: Payer: 59 | Admitting: Family

## 2022-12-31 ENCOUNTER — Inpatient Hospital Stay: Payer: 59

## 2023-01-03 ENCOUNTER — Ambulatory Visit: Payer: 59 | Admitting: Podiatry

## 2023-01-03 ENCOUNTER — Ambulatory Visit: Payer: 59

## 2023-01-03 ENCOUNTER — Other Ambulatory Visit: Payer: 59

## 2023-01-05 ENCOUNTER — Inpatient Hospital Stay: Payer: 59 | Attending: Internal Medicine

## 2023-01-05 ENCOUNTER — Inpatient Hospital Stay: Payer: 59

## 2023-01-05 DIAGNOSIS — D631 Anemia in chronic kidney disease: Secondary | ICD-10-CM | POA: Diagnosis present

## 2023-01-05 DIAGNOSIS — N184 Chronic kidney disease, stage 4 (severe): Secondary | ICD-10-CM | POA: Insufficient documentation

## 2023-01-05 DIAGNOSIS — D649 Anemia, unspecified: Secondary | ICD-10-CM

## 2023-01-05 LAB — HEMOGLOBIN AND HEMATOCRIT, BLOOD
HCT: 34.3 % — ABNORMAL LOW (ref 36.0–46.0)
Hemoglobin: 10.3 g/dL — ABNORMAL LOW (ref 12.0–15.0)

## 2023-01-05 NOTE — Progress Notes (Signed)
Hgb is 10.3 today will hold retacrit

## 2023-01-07 ENCOUNTER — Other Ambulatory Visit: Payer: Self-pay | Admitting: Family Medicine

## 2023-01-07 DIAGNOSIS — Z1231 Encounter for screening mammogram for malignant neoplasm of breast: Secondary | ICD-10-CM

## 2023-01-14 ENCOUNTER — Encounter: Payer: Self-pay | Admitting: Podiatry

## 2023-01-14 ENCOUNTER — Ambulatory Visit (INDEPENDENT_AMBULATORY_CARE_PROVIDER_SITE_OTHER): Payer: 59 | Admitting: Podiatry

## 2023-01-14 VITALS — BP 148/58

## 2023-01-14 DIAGNOSIS — N184 Chronic kidney disease, stage 4 (severe): Secondary | ICD-10-CM

## 2023-01-14 DIAGNOSIS — M79675 Pain in left toe(s): Secondary | ICD-10-CM | POA: Diagnosis not present

## 2023-01-14 DIAGNOSIS — M79674 Pain in right toe(s): Secondary | ICD-10-CM

## 2023-01-14 DIAGNOSIS — B351 Tinea unguium: Secondary | ICD-10-CM

## 2023-01-14 DIAGNOSIS — E1122 Type 2 diabetes mellitus with diabetic chronic kidney disease: Secondary | ICD-10-CM | POA: Diagnosis not present

## 2023-01-16 NOTE — Progress Notes (Signed)
  Subjective:  Patient ID: Alicia Rogers, female    DOB: February 06, 1961,  MRN: [id number]  Alicia Rogers presents to clinic today for: at risk foot care. Pt has h/o NIDDM with chronic kidney disease and painful elongated mycotic toenails 1-5 bilaterally which are tender when wearing enclosed shoe gear. Pain is relieved with periodic professional debridement.  Chief Complaint  Patient presents with   Nail Problem    DFC,Referring Provider Entzminger, Danton Clap, MD,LOV:02/24,A1C:6.3,BS:only takes once weekly,not today       PCP is Entzminger, Danton Clap, MD.  Allergies  Allergen Reactions   Penicillins     Rash   Lisinopril     Review of Systems: Negative except as noted in the HPI.  Objective: No changes noted in today's physical examination. Vitals:   01/14/23 0835  BP: (!) 148/58    Alicia Rogers is a pleasant 62 y.o. female in NAD. AAO x 3.  Vascular Examination: Capillary refill time <3 seconds b/l LE. Palpable pedal pulses b/l LE. Digital hair present b/l. No pedal edema b/l. Skin temperature gradient WNL b/l. No varicosities b/l. Marland Kitchen  Dermatological Examination: Pedal skin with normal turgor, texture and tone b/l. No open wounds. No interdigital macerations b/l. Toenails 1-5 b/l thickened, discolored, dystrophic with subungual debris. There is pain on palpation to dorsal aspect of nailplates. No hyperkeratotic nor porokeratotic lesions present on today's visit.Marland Kitchen  Neurological Examination: Protective sensation intact with 10 gram monofilament b/l LE. Vibratory sensation intact b/l LE.   Musculoskeletal Examination: Normal muscle strength 5/5 to all lower extremity muscle groups bilaterally. No pain, crepitus or joint limitation noted with ROM b/l LE. No gross bony pedal deformities b/l. Patient ambulates independently without assistive aids.     Latest Ref Rng & Units 09/03/2022   10:25 AM 08/30/2022    4:33 PM  Hemoglobin A1C  Hemoglobin-A1c 4.8 - 5.6 % 6.3   6.2    Assessment/Plan: 1. Pain due to onychomycosis of toenails of both feet   2. Type 2 diabetes mellitus with stage 4 chronic kidney disease, unspecified whether long term insulin use (HCC)     -Caregiver/provider present with patient on today's visit. -Continue foot and shoe inspections daily. Monitor blood glucose per PCP/Endocrinologist's recommendations. -Patient to continue soft, supportive shoe gear daily. -Toenails 1-5 b/l were debrided in length and girth with sterile nail nippers and dremel without iatrogenic bleeding.  -Patient/POA to call should there be question/concern in the interim.   Return in about 3 months (around 04/16/2023).  Freddie Breech, DPM

## 2023-01-31 ENCOUNTER — Inpatient Hospital Stay: Payer: 59 | Attending: Internal Medicine

## 2023-01-31 ENCOUNTER — Inpatient Hospital Stay: Payer: 59

## 2023-01-31 DIAGNOSIS — D509 Iron deficiency anemia, unspecified: Secondary | ICD-10-CM | POA: Insufficient documentation

## 2023-01-31 DIAGNOSIS — D649 Anemia, unspecified: Secondary | ICD-10-CM

## 2023-01-31 DIAGNOSIS — D631 Anemia in chronic kidney disease: Secondary | ICD-10-CM | POA: Diagnosis present

## 2023-01-31 DIAGNOSIS — N184 Chronic kidney disease, stage 4 (severe): Secondary | ICD-10-CM | POA: Diagnosis present

## 2023-01-31 DIAGNOSIS — N1832 Chronic kidney disease, stage 3b: Secondary | ICD-10-CM

## 2023-01-31 DIAGNOSIS — D472 Monoclonal gammopathy: Secondary | ICD-10-CM

## 2023-01-31 LAB — HEMOGLOBIN AND HEMATOCRIT, BLOOD
HCT: 33.4 % — ABNORMAL LOW (ref 36.0–46.0)
Hemoglobin: 10 g/dL — ABNORMAL LOW (ref 12.0–15.0)

## 2023-01-31 MED ORDER — EPOETIN ALFA-EPBX 20000 UNIT/ML IJ SOLN: 20000.0000 [IU] | Freq: Once | INTRAMUSCULAR | Status: DC

## 2023-01-31 NOTE — Addendum Note (Signed)
Addended by: Leo Grosser L on: 01/31/2023 11:06 AM   Modules accepted: Orders

## 2023-02-01 ENCOUNTER — Other Ambulatory Visit: Payer: Self-pay | Admitting: Specialist

## 2023-02-01 DIAGNOSIS — R918 Other nonspecific abnormal finding of lung field: Secondary | ICD-10-CM

## 2023-02-13 ENCOUNTER — Emergency Department: Payer: 59

## 2023-02-13 ENCOUNTER — Other Ambulatory Visit: Payer: Self-pay

## 2023-02-13 ENCOUNTER — Emergency Department
Admission: EM | Admit: 2023-02-13 | Discharge: 2023-02-13 | Disposition: A | Discharge status: Home / Self Care | Payer: 59 | Attending: Student in an Organized Health Care Education/Training Program | Admitting: Student in an Organized Health Care Education/Training Program

## 2023-02-13 DIAGNOSIS — Z1152 Encounter for screening for COVID-19: Secondary | ICD-10-CM | POA: Diagnosis not present

## 2023-02-13 DIAGNOSIS — M79605 Pain in left leg: Secondary | ICD-10-CM | POA: Insufficient documentation

## 2023-02-13 DIAGNOSIS — M79604 Pain in right leg: Secondary | ICD-10-CM | POA: Diagnosis not present

## 2023-02-13 DIAGNOSIS — R5381 Other malaise: Secondary | ICD-10-CM | POA: Diagnosis not present

## 2023-02-13 DIAGNOSIS — Z20822 Contact with and (suspected) exposure to covid-19: Secondary | ICD-10-CM | POA: Insufficient documentation

## 2023-02-13 LAB — URINALYSIS, ROUTINE W REFLEX MICROSCOPIC
Bilirubin Urine: NEGATIVE
Glucose, UA: 50 mg/dL — AB
Hgb urine dipstick: NEGATIVE
Ketones, ur: NEGATIVE mg/dL
Leukocytes,Ua: NEGATIVE
Nitrite: NEGATIVE
Protein, ur: 100 mg/dL — AB
Specific Gravity, Urine: 1.01 (ref 1.005–1.030)
pH: 5 (ref 5.0–8.0)

## 2023-02-13 LAB — COMPREHENSIVE METABOLIC PANEL
ALT: 27 U/L (ref 0–44)
AST: 30 U/L (ref 15–41)
Albumin: 3.6 g/dL (ref 3.5–5.0)
Alkaline Phosphatase: 78 U/L (ref 38–126)
Anion gap: 11 (ref 5–15)
BUN: 79 mg/dL — ABNORMAL HIGH (ref 8–23)
CO2: 23 mmol/L (ref 22–32)
Calcium: 10 mg/dL (ref 8.9–10.3)
Chloride: 105 mmol/L (ref 98–111)
Creatinine, Ser: 3.95 mg/dL — ABNORMAL HIGH (ref 0.44–1.00)
GFR, Estimated: 12 mL/min — ABNORMAL LOW (ref >60)
Glucose, Bld: 144 mg/dL — ABNORMAL HIGH (ref 70–99)
Potassium: 4.4 mmol/L (ref 3.5–5.1)
Sodium: 139 mmol/L (ref 135–145)
Total Bilirubin: 0.7 mg/dL (ref 0.3–1.2)
Total Protein: 7.9 g/dL (ref 6.5–8.1)

## 2023-02-13 LAB — CBC WITH DIFFERENTIAL/PLATELET
Abs Immature Granulocytes: 0.08 10*3/uL — ABNORMAL HIGH (ref 0.00–0.07)
Basophils Absolute: 0.1 10*3/uL (ref 0.0–0.1)
Basophils Relative: 0 %
Eosinophils Absolute: 0.2 10*3/uL (ref 0.0–0.5)
Eosinophils Relative: 1 %
HCT: 34.4 % — ABNORMAL LOW (ref 36.0–46.0)
Hemoglobin: 10.3 g/dL — ABNORMAL LOW (ref 12.0–15.0)
Immature Granulocytes: 1 %
Lymphocytes Relative: 22 %
Lymphs Abs: 2.8 10*3/uL (ref 0.7–4.0)
MCH: 27.8 pg (ref 26.0–34.0)
MCHC: 29.9 g/dL — ABNORMAL LOW (ref 30.0–36.0)
MCV: 93 fL (ref 80.0–100.0)
Monocytes Absolute: 1.2 10*3/uL — ABNORMAL HIGH (ref 0.1–1.0)
Monocytes Relative: 9 %
Neutro Abs: 8.6 10*3/uL — ABNORMAL HIGH (ref 1.7–7.7)
Neutrophils Relative %: 67 %
Platelets: 311 10*3/uL (ref 150–400)
RBC: 3.7 MIL/uL — ABNORMAL LOW (ref 3.87–5.11)
RDW: 13.2 % (ref 11.5–15.5)
WBC: 12.9 10*3/uL — ABNORMAL HIGH (ref 4.0–10.5)
nRBC: 0 % (ref 0.0–0.2)

## 2023-02-13 LAB — RESP PANEL BY RT-PCR (RSV, FLU A&B, COVID)  RVPGX2
Influenza A by PCR: NEGATIVE
Influenza B by PCR: NEGATIVE
Resp Syncytial Virus by PCR: NEGATIVE
SARS Coronavirus 2 by RT PCR: NEGATIVE

## 2023-02-13 LAB — CK
Total CK: 327 U/L — ABNORMAL HIGH (ref 38–234)
Total CK: 318 U/L — ABNORMAL HIGH (ref 38–234)

## 2023-02-13 MED ORDER — DIAZEPAM 2 MG PO TABS: 2.0000 mg | ORAL_TABLET | Freq: Once | ORAL | Status: DC

## 2023-02-13 MED ORDER — SODIUM CHLORIDE 0.9 % IV BOLUS
1000.0000 mL | Freq: Once | INTRAVENOUS | Status: AC
  Administered 2023-02-13: 1000 mL via INTRAVENOUS

## 2023-02-13 NOTE — ED Notes (Signed)
Family at bedside. 

## 2023-02-13 NOTE — ED Provider Notes (Signed)
[facility] Provider Note    Event Date/Time   First MD Initiated Contact with Patient 02/13/23 [phone]     (approximate)   History   Weakness   HPI  Alicia Rogers is a 62 y.o. female with an extensive past medical history presents to the ER for evaluation of generalized malaise and bilateral leg achiness.  States that the symptoms have been ongoing for several months.  Denies any headache no chest pain or shortness of breath.  States she been compliant with her medications.     Physical Exam   Triage Vital Signs: ED Triage Vitals  Encounter Vitals Group     BP 02/13/23 0851 (!) 172/55     Systolic BP Percentile --      Diastolic BP Percentile --      Pulse Rate 02/13/23 0851 (!) 59     Resp 02/13/23 0851 16     Temp --      Temp src --      SpO2 02/13/23 0851 96 %     Weight 02/13/23 0853 107 lb (48.5 kg)     Height 02/13/23 0853 5\' 3"  (1.6 m)     Head Circumference --      Peak Flow --      Pain Score 02/13/23 0852 3     Pain Loc --      Pain Education --      Exclude from Growth Chart --     Most recent vital signs: Vitals:   02/13/23 1200 02/13/23 1300  BP: (!) 187/63 (!) 179/64  Pulse: 62 64  Resp: 15 12  Temp:    SpO2: 97% 100%     Constitutional: Alert  Eyes: Conjunctivae are normal.  Head: Atraumatic. Nose: No congestion/rhinnorhea. Mouth/Throat: Mucous membranes are moist.   Neck: Painless ROM.  Cardiovascular:   Good peripheral circulation. Respiratory: Normal respiratory effort.  No retractions.  Gastrointestinal: Soft and nontender.  Musculoskeletal:  no deformity Neurologic:  chronic lue contracture, No new gross focal neurologic deficits are appreciated. Cni Skin:  Skin is warm, dry and intact. No rash noted. Psychiatric: Mood and affect are normal. Speech and behavior are normal.    ED Results / Procedures / Treatments   Labs (all labs ordered are listed, but only abnormal results are displayed) Labs  Reviewed  CBC WITH DIFFERENTIAL/PLATELET - Abnormal; Notable for the following components:      Result Value   WBC 12.9 (*)    RBC 3.70 (*)    Hemoglobin 10.3 (*)    HCT 34.4 (*)    MCHC 29.9 (*)    Neutro Abs 8.6 (*)    Monocytes Absolute 1.2 (*)    Abs Immature Granulocytes 0.08 (*)    All other components within normal limits  COMPREHENSIVE METABOLIC PANEL - Abnormal; Notable for the following components:   Glucose, Bld 144 (*)    BUN 79 (*)    Creatinine, Ser 3.95 (*)    GFR, Estimated 12 (*)    All other components within normal limits  URINALYSIS, ROUTINE W REFLEX MICROSCOPIC - Abnormal; Notable for the following components:   Color, Urine YELLOW (*)    APPearance HAZY (*)    Glucose, UA 50 (*)    Protein, ur 100 (*)    Bacteria, UA RARE (*)    All other components within normal limits  CK - Abnormal; Notable for the following components:   Total CK 327 (*)  All other components within normal limits  CK - Abnormal; Notable for the following components:   Total CK 318 (*)    All other components within normal limits  RESP PANEL BY RT-PCR (RSV, FLU A&B, COVID)  [id number]     EKG     RADIOLOGY Please see ED Course for my review and interpretation.  I personally reviewed all radiographic images ordered to evaluate for the above acute complaints and reviewed radiology reports and findings.  These findings were personally discussed with the patient.  Please see medical record for radiology report.    PROCEDURES:  Critical Care performed: No  Procedures   MEDICATIONS ORDERED IN ED: Medications  sodium chloride 0.9 % bolus 1,000 mL ( Intravenous Infusion Verify 02/13/23 1233)     IMPRESSION / MDM / ASSESSMENT AND PLAN / ED COURSE  I reviewed the triage vital signs and the nursing notes.                              Differential diagnosis includes, but is not limited to, UTI, electrolyte abnormality, dehydration, DVT, medication reaction, nms, withdrawal,  arthritis  Patient presenting to the ER for evaluation of symptoms as described above.  Based on symptoms, risk factors and considered above differential, this presenting complaint could reflect a potentially life-threatening illness therefore the patient will be placed on continuous pulse oximetry and telemetry for monitoring.  Laboratory evaluation will be sent to evaluate for the above complaints.      Clinical Course as of 02/13/23 1438  Sun Feb 13, 2023  0981 Chest x-ray on my review and interpretation without evidence of pneumothorax or consolidation. [PR]  1058 CK mildly elevated.  Will give IV fluid.  She is not showing any other neurodeficits or findings to suggest NMS or serotonin syndrome.  Viral panel negative.  Awaiting urinalysis. [PR]  1436 Patient's repeat CK is downtrending.  Not consistent with rhabdo.  She remains nontoxic.  No specific complaints.  Mildly hypertensive but in no acute distress.  No cogwheel rigidity.  No hyperreflexia.  No clonus.  Does appear stable and appropriate for outpatient follow-up. [PR]    Clinical Course User Index [PR] Willy Eddy, MD     FINAL CLINICAL IMPRESSION(S) / ED DIAGNOSES   Final diagnoses:  Leg pain, bilateral     Rx / DC Orders   ED Discharge Orders     None        Note:  This document was prepared using Dragon voice recognition software and may include unintentional dictation errors.    Willy Eddy, MD 02/13/23 [phone]

## 2023-02-13 NOTE — Discharge Instructions (Signed)
Drink plenty of fluids.  Take your medications as prescribed.  Follow-up with PCP.

## 2023-02-13 NOTE — ED Notes (Signed)
Pt sleeping. 

## 2023-02-13 NOTE — ED Notes (Signed)
Will get repeat CK after 1L bolus.

## 2023-02-13 NOTE — ED Triage Notes (Signed)
Pt from [facility] home, [facility] Pt woke up with bbilateral leg weakness and pain Pt at baseline, (mumbles),oriented to self place time. Hx schizophrenia Pt walks with 1 person assist, shuffling gait per EMS FOllows commands EMS VS: 163/75, HR 60s, 98% RA, 12 lead NSR, CBG 147 Has chronic sacral wound   EDP at bedside, pt is oriented X4

## 2023-02-13 NOTE — ED Notes (Signed)
Spoke with Angie (caregiver) at [facility], [phone]. Gave update.

## 2023-02-18 ENCOUNTER — Ambulatory Visit
Admission: RE | Admit: 2023-02-18 | Discharge: 2023-02-18 | Disposition: A | Discharge status: Home / Self Care | Payer: 59 | Source: Ambulatory Visit | Attending: Specialist | Admitting: Specialist

## 2023-02-18 DIAGNOSIS — R918 Other nonspecific abnormal finding of lung field: Secondary | ICD-10-CM | POA: Diagnosis not present

## 2023-03-04 ENCOUNTER — Other Ambulatory Visit: Payer: 59

## 2023-03-04 ENCOUNTER — Ambulatory Visit: Payer: 59 | Admitting: Internal Medicine

## 2023-03-04 ENCOUNTER — Ambulatory Visit: Payer: 59

## 2023-03-07 ENCOUNTER — Inpatient Hospital Stay (HOSPITAL_BASED_OUTPATIENT_CLINIC_OR_DEPARTMENT_OTHER): Payer: 59 | Admitting: Internal Medicine

## 2023-03-07 ENCOUNTER — Encounter: Payer: Self-pay | Admitting: Internal Medicine

## 2023-03-07 ENCOUNTER — Inpatient Hospital Stay: Payer: 59

## 2023-03-07 ENCOUNTER — Inpatient Hospital Stay: Payer: 59 | Attending: Internal Medicine

## 2023-03-07 VITALS — BP 140/65 | HR 70 | Temp 97.9°F | Ht 63.0 in | Wt 113.7 lb

## 2023-03-07 DIAGNOSIS — D649 Anemia, unspecified: Secondary | ICD-10-CM | POA: Diagnosis not present

## 2023-03-07 DIAGNOSIS — N184 Chronic kidney disease, stage 4 (severe): Secondary | ICD-10-CM | POA: Insufficient documentation

## 2023-03-07 DIAGNOSIS — D472 Monoclonal gammopathy: Secondary | ICD-10-CM

## 2023-03-07 DIAGNOSIS — N1832 Chronic kidney disease, stage 3b: Secondary | ICD-10-CM

## 2023-03-07 DIAGNOSIS — D631 Anemia in chronic kidney disease: Secondary | ICD-10-CM | POA: Diagnosis present

## 2023-03-07 LAB — IRON AND TIBC
Iron: 73 ug/dL (ref 28–170)
Saturation Ratios: 42 % — ABNORMAL HIGH (ref 10.4–31.8)
TIBC: 175 ug/dL — ABNORMAL LOW (ref 250–450)
UIBC: 102 ug/dL

## 2023-03-07 LAB — CMP (CANCER CENTER ONLY)
ALT: 31 U/L (ref 0–44)
AST: 22 U/L (ref 15–41)
Albumin: 3.2 g/dL — ABNORMAL LOW (ref 3.5–5.0)
Alkaline Phosphatase: 71 U/L (ref 38–126)
Anion gap: 7 (ref 5–15)
BUN: 47 mg/dL — ABNORMAL HIGH (ref 8–23)
CO2: 18 mmol/L — ABNORMAL LOW (ref 22–32)
Calcium: 8.9 mg/dL (ref 8.9–10.3)
Chloride: 113 mmol/L — ABNORMAL HIGH (ref 98–111)
Creatinine: 3.37 mg/dL — ABNORMAL HIGH (ref 0.44–1.00)
GFR, Estimated: 15 mL/min — ABNORMAL LOW (ref >60)
Glucose, Bld: 293 mg/dL — ABNORMAL HIGH (ref 70–99)
Potassium: 4.7 mmol/L (ref 3.5–5.1)
Sodium: 138 mmol/L (ref 135–145)
Total Bilirubin: 0.3 mg/dL (ref 0.3–1.2)
Total Protein: 6.6 g/dL (ref 6.5–8.1)

## 2023-03-07 LAB — CBC WITH DIFFERENTIAL (CANCER CENTER ONLY)
Abs Immature Granulocytes: 0.09 10*3/uL — ABNORMAL HIGH (ref 0.00–0.07)
Basophils Absolute: 0 10*3/uL (ref 0.0–0.1)
Basophils Relative: 0 %
Eosinophils Absolute: 0.1 10*3/uL (ref 0.0–0.5)
Eosinophils Relative: 1 %
HCT: 27.1 % — ABNORMAL LOW (ref 36.0–46.0)
Hemoglobin: 8.2 g/dL — ABNORMAL LOW (ref 12.0–15.0)
Immature Granulocytes: 1 %
Lymphocytes Relative: 25 %
Lymphs Abs: 2.1 10*3/uL (ref 0.7–4.0)
MCH: 28.5 pg (ref 26.0–34.0)
MCHC: 30.3 g/dL (ref 30.0–36.0)
MCV: 94.1 fL (ref 80.0–100.0)
Monocytes Absolute: 0.8 10*3/uL (ref 0.1–1.0)
Monocytes Relative: 9 %
Neutro Abs: 5.6 10*3/uL (ref 1.7–7.7)
Neutrophils Relative %: 64 %
Platelet Count: 259 10*3/uL (ref 150–400)
RBC: 2.88 MIL/uL — ABNORMAL LOW (ref 3.87–5.11)
RDW: 14.7 % (ref 11.5–15.5)
WBC Count: 8.7 10*3/uL (ref 4.0–10.5)
nRBC: 0 % (ref 0.0–0.2)

## 2023-03-07 LAB — FERRITIN: Ferritin: 457 ng/mL — ABNORMAL HIGH (ref 11–307)

## 2023-03-07 MED ORDER — EPOETIN ALFA-EPBX 20000 UNIT/ML IJ SOLN
20000.0000 [IU] | Freq: Once | INTRAMUSCULAR | Status: AC
  Administered 2023-03-07: 20000 [IU] via SUBCUTANEOUS
  Filled 2023-03-07: qty 1

## 2023-03-07 MED FILL — Iron Sucrose Inj 20 MG/ML (Fe Equiv): INTRAVENOUS | Qty: 10 | Status: AC

## 2023-03-07 NOTE — Assessment & Plan Note (Signed)
#   Anemia-8- 9-[JUNE 2023]secondary to CKD.   # APRIL 2024- ferritin-356; iron sat 134%. S/p venofer.-Hemoglobin improved at 8.3  Hold off Venofer today.  Proceed with retacrit.   # Etiology of Anemia: Multifactorial likely CKD/ESRD ;  December 2023. Dr.Vanga- [colo/EGD- dec 2023] awaiting flexible sigmoidoscopy for polypectomy. Defer to GI.  T? video capsule endoscopy.  Question secondary to vaginal bleeding.  S/p TAH- feb 2024 [[facility]]  # Chronic kidney disease-IV / ? DM vs other- -followed by Dr.Korrapati- GFR 13- defer to nephrology; as per patient awaiting Dialysis-.   # DISPOSITION:  #  NO venofer; OK with Retacrit # 1 month- H&H- possible retacrit  # 2 month- H&H- possible retacrit  # follow up in 3  month- MD; labs- cbc/cbmp;iron sudies; ferritin- possible IV iron infusion OR retacrit- -Dr.B

## 2023-03-07 NOTE — Progress Notes (Signed)
Fatigue/weakness: no Dyspena: no Light headedness: yes Blood in stool: no

## 2023-03-07 NOTE — Progress Notes (Signed)
[facility] CONSULT NOTE  Patient Care Team: Entzminger, Danton Clap, MD as PCP - General (Internal Medicine) Earna Coder, MD as Consulting Physician (Oncology)  CHIEF COMPLAINTS/PURPOSE OF CONSULTATION: Monoclonal gammopathy   #  JULY 2023-Anemia-8- 9-secondary to CKD/iron deficiency.[colo-2014] Plan IV iron infusion today- ferritin-12; iron sat 16%.   IV iron infusion. EGD/colonoscopy- DEC 2023- [Dr.Vanga]-colonoscopy EGD; recommended polypectomy.     A poorly-defined band of restricted protein mobility is detected       in the gamma globulins. It is unlikely that this may represent a       monoclonal protein; however, immunofixation analysis is available       if clinically indicated.  150.7 High      Free Lambda Lt Chains, S 64.0 High     Free Kappa/Lambda Ratio 2.35 High      Component    Free Kappa Lt Chains, S  Free Lambda Lt Chains, S  Free Kappa/Lambda Ratio    # CKD stage-IV Dr.Korrapati]; schizoaffective disorder [Psyche;?] ; DM- on insulin.   HISTORY OF PRESENTING ILLNESS: Accompanied by caregiver.  Ambulating independently. Poor historian.   Alicia Rogers 62 y.o.  female with schizoaffective disorder with CKD; anemia; is here for follow-up.   Denies any worsening fatigue.  Denies any blood in stools or black-colored stool.   Chronic joint pains however no new onset of bone pain.  Chronic mild fatigue.   Denies any nausea vomiting.  Complains of chronic leg swelling.  Not any worse.  Review of Systems  Constitutional:  Negative for chills, diaphoresis, fever, malaise/fatigue and weight loss.  HENT:  Negative for nosebleeds and sore throat.   Eyes:  Negative for double vision.  Respiratory:  Negative for cough, hemoptysis, sputum production, shortness of breath and wheezing.   Cardiovascular:  Positive for leg swelling. Negative for chest pain, palpitations and orthopnea.  Gastrointestinal:  Negative for abdominal pain, blood in  stool, constipation, diarrhea, heartburn, melena, nausea and vomiting.  Genitourinary:  Negative for dysuria, frequency and urgency.  Musculoskeletal:  Positive for back pain and joint pain.  Skin: Negative.  Negative for itching and rash.  Neurological:  Negative for dizziness, tingling, focal weakness, weakness and headaches.  Endo/Heme/Allergies:  Does not bruise/bleed easily.  Psychiatric/Behavioral:  Negative for depression. The patient is not nervous/anxious and does not have insomnia.     MEDICAL HISTORY:  Past Medical History:  Diagnosis Date   Depression    Diabetes mellitus without complication (HCC)    Diabetes mellitus, type II (HCC)    Hand swelling 09/27/2021   Hypertension    Schizophrenia (HCC)    Stage 3b chronic kidney disease (HCC) 12/22/2021    SURGICAL HISTORY: Past Surgical History:  Procedure Laterality Date   COLONOSCOPY WITH PROPOFOL N/A 07/21/2022   Procedure: COLONOSCOPY WITH PROPOFOL;  Surgeon: Toney Reil, MD;  Location: [facility] ENDOSCOPY;  Service: Gastroenterology;  Laterality: N/A;   colonscopy     ESOPHAGOGASTRODUODENOSCOPY (EGD) WITH PROPOFOL N/A 07/21/2022   Procedure: ESOPHAGOGASTRODUODENOSCOPY (EGD) WITH PROPOFOL;  Surgeon: Toney Reil, MD;  Location: [facility] ENDOSCOPY;  Service: Gastroenterology;  Laterality: N/A;    SOCIAL HISTORY: Social History   Socioeconomic History   Marital status: Single    Spouse name: Not on file   Number of children: Not on file   Years of education: Not on file   Highest education level: Not on file  Occupational History   Not on file  Tobacco Use   Smoking status:  Every Day    Current packs/day: 1.00    Average packs/day: 1 pack/day for 44.9 years (44.9 ttl pk-yrs)    Types: Cigarettes    Start date: 04/21/1978   Smokeless tobacco: Never  Vaping Use   Vaping status: Former  Substance and Sexual Activity   Alcohol use: No    Alcohol/week: 0.0 standard drinks of alcohol   Drug use: No    Sexual activity: Not Currently  Other Topics Concern   Not on file  Social History Narrative   Not on file   Social Determinants of Health   Financial Resource Strain: Medium Risk (03/04/2023)   Received from [facility]   Overall Financial Resource Strain (CARDIA)    Difficulty of Paying Living Expenses: Somewhat hard  Food Insecurity: No Food Insecurity (03/04/2023)   Received from [facility]   Hunger Vital Sign    Worried About Running Out of Food in the Last Year: Never true    Ran Out of Food in the Last Year: Never true  Transportation Needs: No Transportation Needs (03/04/2023)   Received from [facility] - Transportation    In the past 12 months, has lack of transportation kept you from medical appointments or from getting medications?: No    Lack of Transportation (Non-Medical): No  Physical Activity: Not on file  Stress: Not on file  Social Connections: Unknown (09/28/2022)   Received from [facility]   Social Network    Social Network: Not on file  Intimate Partner Violence: Unknown (09/28/2022)   Received from [facility]   HITS    Physically Hurt: Not on file    Insult or Talk Down To: Not on file    Threaten Physical Harm: Not on file    Scream or Curse: Not on file    FAMILY HISTORY: Family History  Problem Relation Age of Onset   Diabetes Mother    Glaucoma Mother    Heart disease Mother    Heart attack Mother    Depression Mother    Hypertension Mother    Diabetes Father    Glaucoma Father    Hypertension Father    Prostate cancer Father    Diabetes Sister    Depression Sister    Post-traumatic stress disorder Sister     ALLERGIES:  is allergic to penicillins and lisinopril.  MEDICATIONS:  Current Outpatient Medications  Medication Sig Dispense Refill   aspirin EC 81 MG tablet Take 81 mg by mouth daily.     BREZTRI AEROSPHERE 160-9-4.8 MCG/ACT AERO Inhale 2 puffs into the lungs  in the morning and at bedtime. Start taking after antibiotics are completed. 10.7 g 11   calcitRIOL (ROCALTROL) 0.25 MCG capsule Take 0.25 mcg by mouth daily.     cyclobenzaprine (FLEXERIL) 10 MG tablet Take 1 tablet (10 mg total) by mouth at bedtime. 30 tablet 0   dapagliflozin propanediol (FARXIGA) 5 MG TABS tablet Take 5 mg by mouth daily.     diltiazem (CARDIZEM CD) 360 MG 24 hr capsule Take 1 capsule (360 mg total) by mouth daily. 30 capsule 3   ergocalciferol (VITAMIN D2) 1.25 MG (50000 UT) capsule Take 50,000 Units by mouth once a week. On Fridays     escitalopram (LEXAPRO) 20 MG tablet Take 20 mg by mouth daily.     ferrous sulfate 325 (65 FE) MG tablet Take 325 mg by mouth 3 (three) times daily with meals.  fluticasone (FLONASE) 50 MCG/ACT nasal spray Place 2 sprays into both nostrils daily. 48 mL 3   furosemide (LASIX) 20 MG tablet Take 20 mg by mouth daily.     gabapentin (NEURONTIN) 100 MG capsule Take 100 mg by mouth 3 (three) times daily.     guaiFENesin-dextromethorphan (ROBITUSSIN DM) 100-10 MG/5ML syrup Take 5 mLs by mouth every 6 (six) hours as needed for cough. 118 mL 0   hydrALAZINE (APRESOLINE) 50 MG tablet Take 1 tablet (50 mg total) by mouth 3 (three) times daily.     hydrochlorothiazide (HYDRODIURIL) 25 MG tablet Take 25 mg by mouth daily.     INVEGA SUSTENNA injection SMARTSIG:117 Milligram(s) IM Once a Month     linagliptin (TRADJENTA) 5 MG TABS tablet Take 5 mg by mouth daily.     loperamide (IMODIUM) 2 MG capsule Take 2 mg by mouth 2 (two) times daily as needed for diarrhea or loose stools.     LORazepam (ATIVAN) 0.5 MG tablet Take 0.5 mg by mouth every 8 (eight) hours. TAKE ONE TABLET BY MOUTH TWICE A DAY FOR 14 DAYS     losartan (COZAAR) 100 MG tablet Take 100 mg by mouth daily.     melatonin 3 MG TABS tablet Take 6 mg by mouth at bedtime.     Multiple Vitamin (MULTIVITAMIN) capsule Take 1 capsule by mouth daily.     multivitamin (RENA-VIT) TABS tablet Take 1  tablet by mouth daily.     nicotine (NICODERM CQ) 21 mg/24hr patch Place 1 patch (21 mg total) onto the skin daily. 28 patch 4   omeprazole (PRILOSEC) 40 MG capsule Take 40 mg by mouth daily.     oxybutynin (DITROPAN XL) 15 MG 24 hr tablet TAKE 1 TABLET BY MOUTH ONCE DAILY FOR BLADDER *WEAR GLOVES IF CRUSHING*     polyethylene glycol powder (GLYCOLAX/MIRALAX) 17 GM/SCOOP powder Take by mouth.     traMADol (ULTRAM) 50 MG tablet Take 1 tablet (50 mg total) by mouth 2 (two) times daily as needed for moderate pain. 15 tablet 0   No current facility-administered medications for this visit.      PHYSICAL EXAMINATION:   Vitals:   03/07/23 1444  BP: (!) 140/65  Pulse: 70  Temp: 97.9 F (36.6 C)  SpO2: 99%    Filed Weights   03/07/23 1444  Weight: 113 lb 11.2 oz (51.6 kg)     Physical Exam Vitals and nursing note reviewed.  HENT:     Head: Normocephalic and atraumatic.     Mouth/Throat:     Pharynx: Oropharynx is clear.  Eyes:     Extraocular Movements: Extraocular movements intact.     Pupils: Pupils are equal, round, and reactive to light.  Cardiovascular:     Rate and Rhythm: Normal rate and regular rhythm.  Pulmonary:     Comments: Decreased breath sounds bilaterally.  Abdominal:     Palpations: Abdomen is soft.  Musculoskeletal:        General: Normal range of motion.     Cervical back: Normal range of motion.  Skin:    General: Skin is warm.  Neurological:     General: No focal deficit present.     Mental Status: She is alert and oriented to person, place, and time.  Psychiatric:        Behavior: Behavior normal.        Judgment: Judgment normal.     LABORATORY DATA:  I have reviewed the data as listed Lab  Results  Component Value Date   WBC 8.7 03/07/2023   HGB 8.2 (L) 03/07/2023   HCT 27.1 (L) 03/07/2023   MCV 94.1 03/07/2023   PLT 259 03/07/2023   Recent Labs    12/01/22 1255 02/13/23 0857 03/07/23 1440  NA 134* 139 138  K 4.5 4.4 4.7  CL  102 105 113*  CO2 19* 23 18*  GLUCOSE 140* 144* 293*  BUN 88* 79* 47*  CREATININE 3.78* 3.95* 3.37*  CALCIUM 9.9 10.0 8.9  GFRNONAA 13* 12* 15*  PROT 7.5 7.9 6.6  ALBUMIN 3.3* 3.6 3.2*  AST 17 30 22   ALT 19 27 31   ALKPHOS 99 78 71  BILITOT 0.3 0.7 0.3     CT CHEST WO CONTRAST  Result Date: 02/27/2023 CLINICAL DATA:  Follow-up pulmonary nodules EXAM: CT CHEST WITHOUT CONTRAST TECHNIQUE: Multidetector CT imaging of the chest was performed following the standard protocol without IV contrast. RADIATION DOSE REDUCTION: This exam was performed according to the departmental dose-optimization program which includes automated exposure control, adjustment of the mA and/or kV according to patient size and/or use of iterative reconstruction technique. COMPARISON:  Prior CT scan of the chest 08/30/2022; prior CT scan of the abdomen and pelvis FINDINGS: Cardiovascular: Limited evaluation in the absence of intravenous contrast. Two vessel aortic arch. The right brachiocephalic and left common carotid artery share a common origin. Atherosclerotic calcifications present throughout the aorta and coronary arteries. No aneurysm. Main pulmonary artery is normal in size. Mild cardiomegaly. No pericardial effusion. Mediastinum/Nodes: Mildly enlarged and heterogeneous thyroid gland consistent with goiter. Probable 1.7 cm nodule on the right. No mediastinal mass or suspicious adenopathy. Small amount of high attenuation material in the esophageal lumen. Lungs/Pleura: Centrilobular pulmonary emphysema. Interval resolution of multifocal ground-glass attenuation airspace opacities. No residual pulmonary mass or nodule. No pleural effusion or pneumothorax. Upper Abdomen: No acute abnormality. Stable appearance of multiple bilateral adrenal adenomas as seen on prior imaging. Low-attenuation simple renal cyst also again noted without interval change. No imaging follow-up recommended. Musculoskeletal: Exaggerated thoracic  kyphosis. No acute fracture or aggressive appearing lytic or blastic osseous lesion. IMPRESSION: 1. Complete interval resolution of multifocal ground-glass attenuation airspace opacities consistent with a resolved infectious/inflammatory process of the lungs. No residual pulmonary nodules. 2. Centrilobular pulmonary emphysema. 3. Aortic and multivessel coronary artery atherosclerotic vascular calcifications. 4. Exaggerated thoracic kyphosis. 5. Mild cardiomegaly. Aortic Atherosclerosis (ICD10-I70.0) and Emphysema (ICD10-J43.9). Electronically Signed   By: Malachy Moan M.D.   On: 02/27/2023 10:12   US Venous Img Lower Bilateral  Result Date: 02/13/2023 CLINICAL DATA:  Leg pain EXAM: BILATERAL LOWER EXTREMITY VENOUS DOPPLER ULTRASOUND TECHNIQUE: Gray-scale sonography with graded compression, as well as color Doppler and duplex ultrasound were performed to evaluate the lower extremity deep venous systems from the level of the common femoral vein and including the common femoral, femoral, profunda femoral, popliteal and calf veins including the posterior tibial, peroneal and gastrocnemius veins when visible. The superficial great saphenous vein was also interrogated. Spectral Doppler was utilized to evaluate flow at rest and with distal augmentation maneuvers in the common femoral, femoral and popliteal veins. COMPARISON:  None Available. FINDINGS: RIGHT LOWER EXTREMITY Common Femoral Vein: No evidence of thrombus. Normal compressibility, respiratory phasicity and response to augmentation. Saphenofemoral Junction: No evidence of thrombus. Normal compressibility and flow on color Doppler imaging. Profunda Femoral Vein: No evidence of thrombus. Normal compressibility and flow on color Doppler imaging. Femoral Vein: No evidence of thrombus. Normal compressibility, respiratory phasicity and response to augmentation.  Popliteal Vein: No evidence of thrombus. Normal compressibility, respiratory phasicity and response  to augmentation. Calf Veins: No evidence of thrombus. Normal compressibility and flow on color Doppler imaging. Superficial Great Saphenous Vein: No evidence of thrombus. Normal compressibility. Venous Reflux:  None. Other Findings:  None. LEFT LOWER EXTREMITY Common Femoral Vein: No evidence of thrombus. Normal compressibility, respiratory phasicity and response to augmentation. Saphenofemoral Junction: No evidence of thrombus. Normal compressibility and flow on color Doppler imaging. Profunda Femoral Vein: No evidence of thrombus. Normal compressibility and flow on color Doppler imaging. Femoral Vein: No evidence of thrombus. Normal compressibility, respiratory phasicity and response to augmentation. Popliteal Vein: No evidence of thrombus. Normal compressibility, respiratory phasicity and response to augmentation. Calf Veins: No evidence of thrombus. Normal compressibility and flow on color Doppler imaging. Superficial Great Saphenous Vein: No evidence of thrombus. Normal compressibility. Venous Reflux:  None. Other Findings:  None. IMPRESSION: No evidence of deep venous thrombosis in either lower extremity. Electronically Signed   By: Duanne Guess D.O.   On: 02/13/2023 10:21   DG Chest Portable 1 View  Result Date: 02/13/2023 CLINICAL DATA:  Lower extremity weakness and pain. EXAM: PORTABLE CHEST 1 VIEW COMPARISON:  None Available. FINDINGS: Patient is partially rotated to the left. The heart size and mediastinal contours are within normal limits. Both lungs are clear. The visualized skeletal structures are unremarkable. IMPRESSION: No active disease. Electronically Signed   By: Danae Orleans M.D.   On: 02/13/2023 09:14    Lab Results  Component Value Date   KPAFRELGTCHN 163.8 (H) 02/05/2022   LAMBDASER 62.2 (H) 02/05/2022   KAPLAMBRATIO 2.63 (H) 02/05/2022     Symptomatic anemia # Anemia-8- 9-[JUNE 2023]secondary to CKD.   # APRIL 2024- ferritin-356; iron sat 134%. S/p venofer.-Hemoglobin  improved at 8.3  Hold off Venofer today.  Proceed with retacrit.   # Etiology of Anemia: Multifactorial likely CKD/ESRD ;  December 2023. Dr.Vanga- [colo/EGD- dec 2023] awaiting flexible sigmoidoscopy for polypectomy. Defer to GI.  T? video capsule endoscopy.  Question secondary to vaginal bleeding.  S/p TAH- feb 2024 [[facility]]  # Chronic kidney disease-IV / ? DM vs other- -followed by Dr.Korrapati- GFR 13- defer to nephrology; as per patient awaiting Dialysis-.   # DISPOSITION:  #  NO venofer; OK with Retacrit # 1 month- H&H- possible retacrit  # 2 month- H&H- possible retacrit  # follow up in 3  month- MD; labs- cbc/cbmp;iron sudies; ferritin- possible IV iron infusion OR retacrit- -Dr.B   All questions were answered. The patient knows to call the clinic with any problems, questions or concerns.      Earna Coder, MD 03/07/2023 3:28 PM

## 2023-03-09 DIAGNOSIS — E041 Nontoxic single thyroid nodule: Secondary | ICD-10-CM | POA: Insufficient documentation

## 2023-03-17 ENCOUNTER — Other Ambulatory Visit: Payer: Self-pay

## 2023-03-17 ENCOUNTER — Emergency Department
Admission: EM | Admit: 2023-03-17 | Discharge: 2023-03-17 | Disposition: A | Discharge status: Home / Self Care | Payer: 59 | Attending: Emergency Medicine | Admitting: Emergency Medicine

## 2023-03-17 ENCOUNTER — Emergency Department: Payer: 59

## 2023-03-17 DIAGNOSIS — Y939 Activity, unspecified: Secondary | ICD-10-CM | POA: Insufficient documentation

## 2023-03-17 DIAGNOSIS — E119 Type 2 diabetes mellitus without complications: Secondary | ICD-10-CM | POA: Diagnosis not present

## 2023-03-17 DIAGNOSIS — M7062 Trochanteric bursitis, left hip: Secondary | ICD-10-CM | POA: Diagnosis not present

## 2023-03-17 DIAGNOSIS — I10 Essential (primary) hypertension: Secondary | ICD-10-CM | POA: Diagnosis not present

## 2023-03-17 DIAGNOSIS — M25552 Pain in left hip: Secondary | ICD-10-CM | POA: Diagnosis present

## 2023-03-17 MED ORDER — LIDOCAINE 5 % EX PTCH: 1.0000 | MEDICATED_PATCH | Freq: Two times a day (BID) | CUTANEOUS | 0 refills | Status: AC

## 2023-03-17 NOTE — ED Triage Notes (Addendum)
First Nurse Note;  Pt via [facility] from [facility]. Pt c/o L hip pain for the past 5 day. Denies injury. VSS. Ambulatory on scene. Pt is at her baseline

## 2023-03-17 NOTE — ED Triage Notes (Addendum)
Pt to ED via [facility] from [facility]. Pt reports left hip pain x5 days. Pt reports last fall was a month ago. EMS reports pt is at her baseline but pt states sometimes her "mouth feels funny" and wants to be evaluated for that as well.

## 2023-03-17 NOTE — Discharge Instructions (Addendum)
Apply one patch to left hip daily.

## 2023-03-17 NOTE — ED Notes (Signed)
Call to two family members but unable to reach them.  Call to family care home where I advised staff that pt is up for d/c and will need to be picked up, they tell me that they will get in contact with family who will come get her.

## 2023-03-17 NOTE — ED Provider Notes (Signed)
[facility] Provider Note  Patient Contact: 6:33 PM (approximate)   History   Hip Pain (Left)   HPI  Alicia Rogers is a 62 y.o. female with a history of diabetes, hypertension, schizophrenia and depression, presents to the emergency department with left lateral hip pain that is reproducible to palpation.  Patient has had no skin changes in this area.  No falls or other mechanisms of trauma.  No prior history of trochanteric bursitis.  No numbness or tingling in the lower extremities.      Physical Exam   Triage Vital Signs: ED Triage Vitals [03/17/23 1610]  Encounter Vitals Group     BP 135/61     Systolic BP Percentile      Diastolic BP Percentile      Pulse Rate (!) 56     Resp 18     Temp 98.4 F (36.9 C)     Temp Source Oral     SpO2 95 %     Weight      Height      Head Circumference      Peak Flow      Pain Score 6     Pain Loc      Pain Education      Exclude from Growth Chart     Most recent vital signs: Vitals:   03/17/23 1610  BP: 135/61  Pulse: (!) 56  Resp: 18  Temp: 98.4 F (36.9 C)  SpO2: 95%     General: Alert and in no acute distress. Eyes:  PERRL. EOMI. Head: No acute traumatic findings ENT:      Nose: No congestion/rhinnorhea.      Mouth/Throat: Mucous membranes are moist. Neck: No stridor. No cervical spine tenderness to palpation. Cardiovascular:  Good peripheral perfusion Respiratory: Normal respiratory effort without tachypnea or retractions. Lungs CTAB. Good air entry to the bases with no decreased or absent breath sounds. Gastrointestinal: Bowel sounds 4 quadrants. Soft and nontender to palpation. No guarding or rigidity. No palpable masses. No distention. No CVA tenderness. Musculoskeletal: Full range of motion to all extremities.  Patient has reproducible tenderness to palpation over left trochanteric bursa. Neurologic:  No gross focal neurologic deficits are appreciated.  Skin:   No rash  noted    ED Results / Procedures / Treatments   Labs (all labs ordered are listed, but only abnormal results are displayed) Labs Reviewed - No data to display     PROCEDURES:  Critical Care performed: No  Procedures   MEDICATIONS ORDERED IN ED: Medications - No data to display   IMPRESSION / MDM / ASSESSMENT AND PLAN / ED COURSE  I reviewed the triage vital signs and the nursing notes.                              Assessment and plan Trochanteric bursitis 62 year old female presents to the emergency department with left lateral hip pain that is reproducible to palpation and concerning for trochanteric bursitis.  Will prescribe patient lidocaine patches to be used topically and continue to use Tylenol as needed for pain.  She was given a referral to orthopedics.  Return precautions were given to return with new or worsening symptoms.  All patient questions were answered.      FINAL CLINICAL IMPRESSION(S) / ED DIAGNOSES   Final diagnoses:  Trochanteric bursitis of left hip     Rx / DC Orders  ED Discharge Orders          Ordered    lidocaine (LIDODERM) 5 %  Every 12 hours        03/17/23 1831             Note:  This document was prepared using Dragon voice recognition software and may include unintentional dictation errors.   Pia Mau Lamberton, PA-C 03/17/23 1836    Chesley Noon, MD 03/17/23 Ernestina Columbia

## 2023-04-05 ENCOUNTER — Ambulatory Visit: Payer: 59

## 2023-04-06 ENCOUNTER — Inpatient Hospital Stay: Payer: 59 | Attending: Internal Medicine

## 2023-04-06 ENCOUNTER — Inpatient Hospital Stay: Payer: 59

## 2023-04-06 VITALS — BP 151/48

## 2023-04-06 DIAGNOSIS — D631 Anemia in chronic kidney disease: Secondary | ICD-10-CM | POA: Diagnosis present

## 2023-04-06 DIAGNOSIS — D649 Anemia, unspecified: Secondary | ICD-10-CM

## 2023-04-06 DIAGNOSIS — D472 Monoclonal gammopathy: Secondary | ICD-10-CM

## 2023-04-06 DIAGNOSIS — N184 Chronic kidney disease, stage 4 (severe): Secondary | ICD-10-CM | POA: Diagnosis present

## 2023-04-06 DIAGNOSIS — N1832 Chronic kidney disease, stage 3b: Secondary | ICD-10-CM

## 2023-04-06 LAB — HEMOGLOBIN AND HEMATOCRIT (CANCER CENTER ONLY)
HCT: 30.9 % — ABNORMAL LOW (ref 36.0–46.0)
Hemoglobin: 9.1 g/dL — ABNORMAL LOW (ref 12.0–15.0)

## 2023-04-06 MED ORDER — EPOETIN ALFA-EPBX 20000 UNIT/ML IJ SOLN
20000.0000 [IU] | Freq: Once | INTRAMUSCULAR | Status: AC
  Administered 2023-04-06: 20000 [IU] via SUBCUTANEOUS
  Filled 2023-04-06: qty 1

## 2023-04-12 ENCOUNTER — Ambulatory Visit: Payer: 59

## 2023-04-25 ENCOUNTER — Ambulatory Visit: Payer: 59 | Admitting: Podiatry

## 2023-05-02 ENCOUNTER — Other Ambulatory Visit (INDEPENDENT_AMBULATORY_CARE_PROVIDER_SITE_OTHER): Payer: Self-pay | Admitting: Nurse Practitioner

## 2023-05-02 DIAGNOSIS — N1832 Chronic kidney disease, stage 3b: Secondary | ICD-10-CM

## 2023-05-05 ENCOUNTER — Ambulatory Visit: Payer: 59 | Admitting: Podiatry

## 2023-05-05 ENCOUNTER — Encounter: Payer: Self-pay | Admitting: Podiatry

## 2023-05-05 DIAGNOSIS — B351 Tinea unguium: Secondary | ICD-10-CM

## 2023-05-05 DIAGNOSIS — B353 Tinea pedis: Secondary | ICD-10-CM | POA: Diagnosis not present

## 2023-05-05 DIAGNOSIS — E1122 Type 2 diabetes mellitus with diabetic chronic kidney disease: Secondary | ICD-10-CM

## 2023-05-05 DIAGNOSIS — M79675 Pain in left toe(s): Secondary | ICD-10-CM

## 2023-05-05 DIAGNOSIS — M79674 Pain in right toe(s): Secondary | ICD-10-CM

## 2023-05-05 MED ORDER — KETOCONAZOLE 2 % EX CREA
TOPICAL_CREAM | CUTANEOUS | 1 refills | Status: DC
Start: 2023-05-05 — End: 2023-05-16

## 2023-05-05 NOTE — Progress Notes (Signed)
Subjective:  Patient ID: Alicia Rogers, female    DOB: November 11, 1960,  MRN: [id number]  Alicia Rogers presents to clinic today for: at risk foot care. Pt has h/o NIDDM with chronic kidney disease and painful elongated mycotic toenails 1-5 bilaterally which are tender when wearing enclosed shoe gear. Pain is relieved with periodic professional debridement. She is accompanied by caregiver, [name] who states patient is getting ready to go on dialysis. Chief Complaint  Patient presents with   Diabetes    DFC BS-134 A1C - PCPV- 04/2023    PCP is System, Provider Not In.  Allergies  Allergen Reactions   Penicillins     Rash   Lisinopril     Review of Systems: Negative except as noted in the HPI.  Objective: No changes noted in today's physical examination. There were no vitals filed for this visit.  Alicia Rogers is a pleasant 62 y.o. female in NAD. AAO x 3.  Vascular Examination: Capillary refill time <3 seconds b/l LE. Palpable pedal pulses b/l LE. Digital hair decreased b/l.  Skin temperature gradient WNL b/l. No varicosities b/l. +1 pitting edema noted BLE.Marland Kitchen  Dermatological Examination: Pedal skin with normal turgor, texture and tone b/l. No open wounds. No interdigital macerations b/l. Toenails 1-5 b/l thickened, discolored, dystrophic with subungual debris. There is pain on palpation to dorsal aspect of nailplates. Diffuse scaling noted dorsal aspect of all digits.  No interdigital macerations.  No blisters, no weeping. No signs of secondary bacterial infection noted..  Neurological Examination: Protective sensation intact with 10 gram monofilament b/l LE. Vibratory sensation intact b/l LE.   Musculoskeletal Examination: Muscle strength 5/5 to all lower extremity muscle groups bilaterally. Pes planus deformity noted bilateral LE. Wearing appropriate fitting shoe gear.     Latest Ref Rng & Units 09/03/2022   10:25 AM 08/30/2022    4:33 PM  Hemoglobin A1C   Hemoglobin-A1c 4.8 - 5.6 % 6.3  6.2    Assessment/Plan: 1. Pain due to onychomycosis of toenails of both feet   2. Type 2 diabetes mellitus with stage 4 chronic kidney disease, unspecified whether long term insulin use (HCC)     Meds ordered this encounter  Medications   ketoconazole (NIZORAL) 2 % cream    Sig: Apply to both feet and between toes once daily for 6 weeks.    Dispense:  60 g    Refill:  1    Patient was evaluated and treated. All patient's and/or POA's questions/concerns addressed on today's visit. Toenails 1-5 debrided in length and girth without incident. Continue soft, supportive shoe gear daily. Report any pedal injuries to medical professional. Call office if there are any questions/concerns. -Continue foot and shoe inspections daily. Monitor blood glucose per PCP/Endocrinologist's recommendations. -For tinea pedis, Rx sent to pharmacy for Ketoconazole Cream 2% to be applied once daily for six weeks. -Patient/POA to call should there be question/concern in the interim.   Return in about 3 months (around 08/05/2023).  Freddie Breech, DPM

## 2023-05-06 ENCOUNTER — Inpatient Hospital Stay: Payer: 59

## 2023-05-07 ENCOUNTER — Other Ambulatory Visit: Payer: Self-pay

## 2023-05-07 ENCOUNTER — Inpatient Hospital Stay
Admission: EM | Admit: 2023-05-07 | Discharge: 2023-05-16 | DRG: 673 | Disposition: A | Discharge status: Skilled Nursing Facility | Payer: 59 | Attending: Student | Admitting: Student

## 2023-05-07 ENCOUNTER — Emergency Department: Payer: 59

## 2023-05-07 DIAGNOSIS — R300 Dysuria: Secondary | ICD-10-CM | POA: Diagnosis present

## 2023-05-07 DIAGNOSIS — N2581 Secondary hyperparathyroidism of renal origin: Secondary | ICD-10-CM | POA: Diagnosis present

## 2023-05-07 DIAGNOSIS — Z7984 Long term (current) use of oral hypoglycemic drugs: Secondary | ICD-10-CM

## 2023-05-07 DIAGNOSIS — Z833 Family history of diabetes mellitus: Secondary | ICD-10-CM

## 2023-05-07 DIAGNOSIS — N179 Acute kidney failure, unspecified: Secondary | ICD-10-CM | POA: Diagnosis present

## 2023-05-07 DIAGNOSIS — E041 Nontoxic single thyroid nodule: Secondary | ICD-10-CM | POA: Diagnosis present

## 2023-05-07 DIAGNOSIS — L89890 Pressure ulcer of other site, unstageable: Secondary | ICD-10-CM | POA: Diagnosis present

## 2023-05-07 DIAGNOSIS — E785 Hyperlipidemia, unspecified: Secondary | ICD-10-CM | POA: Diagnosis present

## 2023-05-07 DIAGNOSIS — F17210 Nicotine dependence, cigarettes, uncomplicated: Secondary | ICD-10-CM | POA: Diagnosis present

## 2023-05-07 DIAGNOSIS — L89220 Pressure ulcer of left hip, unstageable: Secondary | ICD-10-CM | POA: Diagnosis present

## 2023-05-07 DIAGNOSIS — E8770 Fluid overload, unspecified: Secondary | ICD-10-CM | POA: Diagnosis present

## 2023-05-07 DIAGNOSIS — D631 Anemia in chronic kidney disease: Secondary | ICD-10-CM | POA: Diagnosis present

## 2023-05-07 DIAGNOSIS — J449 Chronic obstructive pulmonary disease, unspecified: Secondary | ICD-10-CM | POA: Diagnosis present

## 2023-05-07 DIAGNOSIS — Z515 Encounter for palliative care: Secondary | ICD-10-CM | POA: Diagnosis not present

## 2023-05-07 DIAGNOSIS — I509 Heart failure, unspecified: Secondary | ICD-10-CM

## 2023-05-07 DIAGNOSIS — N185 Chronic kidney disease, stage 5: Secondary | ICD-10-CM | POA: Insufficient documentation

## 2023-05-07 DIAGNOSIS — J90 Pleural effusion, not elsewhere classified: Secondary | ICD-10-CM | POA: Insufficient documentation

## 2023-05-07 DIAGNOSIS — Z888 Allergy status to other drugs, medicaments and biological substances status: Secondary | ICD-10-CM

## 2023-05-07 DIAGNOSIS — B954 Other streptococcus as the cause of diseases classified elsewhere: Secondary | ICD-10-CM | POA: Diagnosis present

## 2023-05-07 DIAGNOSIS — Z6821 Body mass index (BMI) 21.0-21.9, adult: Secondary | ICD-10-CM

## 2023-05-07 DIAGNOSIS — R601 Generalized edema: Secondary | ICD-10-CM | POA: Diagnosis present

## 2023-05-07 DIAGNOSIS — J9601 Acute respiratory failure with hypoxia: Secondary | ICD-10-CM | POA: Diagnosis present

## 2023-05-07 DIAGNOSIS — F17200 Nicotine dependence, unspecified, uncomplicated: Secondary | ICD-10-CM | POA: Diagnosis present

## 2023-05-07 DIAGNOSIS — E440 Moderate protein-calorie malnutrition: Secondary | ICD-10-CM | POA: Insufficient documentation

## 2023-05-07 DIAGNOSIS — R0902 Hypoxemia: Secondary | ICD-10-CM

## 2023-05-07 DIAGNOSIS — E1122 Type 2 diabetes mellitus with diabetic chronic kidney disease: Secondary | ICD-10-CM | POA: Diagnosis present

## 2023-05-07 DIAGNOSIS — I5032 Chronic diastolic (congestive) heart failure: Secondary | ICD-10-CM | POA: Diagnosis present

## 2023-05-07 DIAGNOSIS — Z818 Family history of other mental and behavioral disorders: Secondary | ICD-10-CM

## 2023-05-07 DIAGNOSIS — D472 Monoclonal gammopathy: Secondary | ICD-10-CM | POA: Diagnosis present

## 2023-05-07 DIAGNOSIS — E8722 Chronic metabolic acidosis: Secondary | ICD-10-CM | POA: Diagnosis present

## 2023-05-07 DIAGNOSIS — R911 Solitary pulmonary nodule: Secondary | ICD-10-CM | POA: Diagnosis present

## 2023-05-07 DIAGNOSIS — D72829 Elevated white blood cell count, unspecified: Secondary | ICD-10-CM

## 2023-05-07 DIAGNOSIS — Z79899 Other long term (current) drug therapy: Secondary | ICD-10-CM

## 2023-05-07 DIAGNOSIS — I132 Hypertensive heart and chronic kidney disease with heart failure and with stage 5 chronic kidney disease, or end stage renal disease: Secondary | ICD-10-CM | POA: Diagnosis present

## 2023-05-07 DIAGNOSIS — R339 Retention of urine, unspecified: Secondary | ICD-10-CM | POA: Diagnosis present

## 2023-05-07 DIAGNOSIS — F250 Schizoaffective disorder, bipolar type: Secondary | ICD-10-CM | POA: Diagnosis present

## 2023-05-07 DIAGNOSIS — I5031 Acute diastolic (congestive) heart failure: Secondary | ICD-10-CM | POA: Diagnosis not present

## 2023-05-07 DIAGNOSIS — Z1152 Encounter for screening for COVID-19: Secondary | ICD-10-CM | POA: Diagnosis not present

## 2023-05-07 DIAGNOSIS — D509 Iron deficiency anemia, unspecified: Secondary | ICD-10-CM | POA: Diagnosis present

## 2023-05-07 DIAGNOSIS — B9561 Methicillin susceptible Staphylococcus aureus infection as the cause of diseases classified elsewhere: Secondary | ICD-10-CM | POA: Diagnosis present

## 2023-05-07 DIAGNOSIS — I10 Essential (primary) hypertension: Secondary | ICD-10-CM | POA: Diagnosis present

## 2023-05-07 DIAGNOSIS — N049 Nephrotic syndrome with unspecified morphologic changes: Secondary | ICD-10-CM | POA: Diagnosis present

## 2023-05-07 DIAGNOSIS — Z83511 Family history of glaucoma: Secondary | ICD-10-CM

## 2023-05-07 DIAGNOSIS — L89229 Pressure ulcer of left hip, unspecified stage: Secondary | ICD-10-CM | POA: Diagnosis not present

## 2023-05-07 DIAGNOSIS — Z716 Tobacco abuse counseling: Secondary | ICD-10-CM

## 2023-05-07 DIAGNOSIS — Z7951 Long term (current) use of inhaled steroids: Secondary | ICD-10-CM

## 2023-05-07 DIAGNOSIS — Z8249 Family history of ischemic heart disease and other diseases of the circulatory system: Secondary | ICD-10-CM

## 2023-05-07 DIAGNOSIS — E119 Type 2 diabetes mellitus without complications: Secondary | ICD-10-CM

## 2023-05-07 DIAGNOSIS — Z7982 Long term (current) use of aspirin: Secondary | ICD-10-CM

## 2023-05-07 DIAGNOSIS — Z880 Allergy status to penicillin: Secondary | ICD-10-CM

## 2023-05-07 DIAGNOSIS — E876 Hypokalemia: Secondary | ICD-10-CM | POA: Diagnosis present

## 2023-05-07 DIAGNOSIS — R600 Localized edema: Secondary | ICD-10-CM | POA: Insufficient documentation

## 2023-05-07 DIAGNOSIS — R338 Other retention of urine: Secondary | ICD-10-CM

## 2023-05-07 DIAGNOSIS — L89200 Pressure ulcer of unspecified hip, unstageable: Secondary | ICD-10-CM

## 2023-05-07 LAB — CBC WITH DIFFERENTIAL/PLATELET
Abs Immature Granulocytes: 0.17 10*3/uL — ABNORMAL HIGH (ref 0.00–0.07)
Basophils Absolute: 0.1 10*3/uL (ref 0.0–0.1)
Basophils Relative: 0 %
Eosinophils Absolute: 0.1 10*3/uL (ref 0.0–0.5)
Eosinophils Relative: 1 %
HCT: 27.3 % — ABNORMAL LOW (ref 36.0–46.0)
Hemoglobin: 8.4 g/dL — ABNORMAL LOW (ref 12.0–15.0)
Immature Granulocytes: 1 %
Lymphocytes Relative: 19 %
Lymphs Abs: 2.7 10*3/uL (ref 0.7–4.0)
MCH: 28.7 pg (ref 26.0–34.0)
MCHC: 30.8 g/dL (ref 30.0–36.0)
MCV: 93.2 fL (ref 80.0–100.0)
Monocytes Absolute: 0.9 10*3/uL (ref 0.1–1.0)
Monocytes Relative: 6 %
Neutro Abs: 10.6 10*3/uL — ABNORMAL HIGH (ref 1.7–7.7)
Neutrophils Relative %: 73 %
Platelets: 458 10*3/uL — ABNORMAL HIGH (ref 150–400)
RBC: 2.93 MIL/uL — ABNORMAL LOW (ref 3.87–5.11)
RDW: 14.2 % (ref 11.5–15.5)
WBC: 14.6 10*3/uL — ABNORMAL HIGH (ref 4.0–10.5)
nRBC: 0 % (ref 0.0–0.2)

## 2023-05-07 LAB — URINALYSIS, ROUTINE W REFLEX MICROSCOPIC
Bilirubin Urine: NEGATIVE
Glucose, UA: >=500 mg/dL — AB
Hgb urine dipstick: NEGATIVE
Ketones, ur: NEGATIVE mg/dL
Nitrite: NEGATIVE
Protein, ur: >=300 mg/dL — AB
Specific Gravity, Urine: 1.008 (ref 1.005–1.030)
pH: 7 (ref 5.0–8.0)

## 2023-05-07 LAB — COMPREHENSIVE METABOLIC PANEL
ALT: 22 U/L (ref 0–44)
AST: 22 U/L (ref 15–41)
Albumin: 2 g/dL — ABNORMAL LOW (ref 3.5–5.0)
Alkaline Phosphatase: 105 U/L (ref 38–126)
Anion gap: 12 (ref 5–15)
BUN: 33 mg/dL — ABNORMAL HIGH (ref 8–23)
CO2: 27 mmol/L (ref 22–32)
Calcium: 8.5 mg/dL — ABNORMAL LOW (ref 8.9–10.3)
Chloride: 99 mmol/L (ref 98–111)
Creatinine, Ser: 2.85 mg/dL — ABNORMAL HIGH (ref 0.44–1.00)
GFR, Estimated: 18 mL/min — ABNORMAL LOW (ref >60)
Glucose, Bld: 158 mg/dL — ABNORMAL HIGH (ref 70–99)
Potassium: 3.2 mmol/L — ABNORMAL LOW (ref 3.5–5.1)
Sodium: 138 mmol/L (ref 135–145)
Total Bilirubin: 0.5 mg/dL (ref 0.3–1.2)
Total Protein: 7.1 g/dL (ref 6.5–8.1)

## 2023-05-07 LAB — RESP PANEL BY RT-PCR (RSV, FLU A&B, COVID)  RVPGX2
Influenza A by PCR: NEGATIVE
Influenza B by PCR: NEGATIVE
Resp Syncytial Virus by PCR: NEGATIVE
SARS Coronavirus 2 by RT PCR: NEGATIVE

## 2023-05-07 LAB — BRAIN NATRIURETIC PEPTIDE: B Natriuretic Peptide: 441.2 pg/mL — ABNORMAL HIGH (ref 0.0–100.0)

## 2023-05-07 LAB — LACTIC ACID, PLASMA: Lactic Acid, Venous: 1.1 mmol/L (ref 0.5–1.9)

## 2023-05-07 MED ORDER — INSULIN ASPART 100 UNIT/ML IJ SOLN
0.0000 [IU] | Freq: Three times a day (TID) | INTRAMUSCULAR | Status: DC
  Administered 2023-05-08: 1 [IU] via SUBCUTANEOUS
  Administered 2023-05-08 – 2023-05-09 (×3): 2 [IU] via SUBCUTANEOUS
  Administered 2023-05-09: 1 [IU] via SUBCUTANEOUS
  Administered 2023-05-10 (×2): 2 [IU] via SUBCUTANEOUS
  Administered 2023-05-11 (×2): 3 [IU] via SUBCUTANEOUS
  Administered 2023-05-12: 2 [IU] via SUBCUTANEOUS
  Administered 2023-05-12: 3 [IU] via SUBCUTANEOUS
  Administered 2023-05-13 (×3): 1 [IU] via SUBCUTANEOUS
  Administered 2023-05-14: 2 [IU] via SUBCUTANEOUS
  Administered 2023-05-14: 3 [IU] via SUBCUTANEOUS
  Administered 2023-05-15: 2 [IU] via SUBCUTANEOUS
  Administered 2023-05-16: 3 [IU] via SUBCUTANEOUS
  Filled 2023-05-07 (×18): qty 1

## 2023-05-07 MED ORDER — TRAMADOL HCL 50 MG PO TABS
50.0000 mg | ORAL_TABLET | Freq: Two times a day (BID) | ORAL | Status: DC | PRN
  Administered 2023-05-08 – 2023-05-09 (×2): 50 mg via ORAL
  Filled 2023-05-07 (×2): qty 1

## 2023-05-07 MED ORDER — ONDANSETRON HCL 4 MG/2ML IJ SOLN: 4.0000 mg | Freq: Four times a day (QID) | INTRAMUSCULAR | Status: DC | PRN

## 2023-05-07 MED ORDER — FUROSEMIDE 10 MG/ML IJ SOLN
40.0000 mg | Freq: Once | INTRAMUSCULAR | Status: AC
  Administered 2023-05-07: 40 mg via INTRAVENOUS
  Filled 2023-05-07: qty 4

## 2023-05-07 MED ORDER — ACETAMINOPHEN 325 MG PO TABS
650.0000 mg | ORAL_TABLET | Freq: Four times a day (QID) | ORAL | Status: DC | PRN
  Administered 2023-05-11 – 2023-05-12 (×2): 650 mg via ORAL
  Filled 2023-05-07 (×2): qty 2

## 2023-05-07 MED ORDER — LOSARTAN POTASSIUM 50 MG PO TABS
100.0000 mg | ORAL_TABLET | Freq: Every day | ORAL | Status: DC
  Administered 2023-05-08 – 2023-05-13 (×5): 100 mg via ORAL
  Filled 2023-05-07 (×6): qty 2

## 2023-05-07 MED ORDER — BUDESON-GLYCOPYRROL-FORMOTEROL 160-9-4.8 MCG/ACT IN AERO: 2.0000 | INHALATION_SPRAY | Freq: Two times a day (BID) | RESPIRATORY_TRACT | Status: DC

## 2023-05-07 MED ORDER — POTASSIUM CHLORIDE CRYS ER 20 MEQ PO TBCR
40.0000 meq | EXTENDED_RELEASE_TABLET | Freq: Every day | ORAL | Status: DC
  Administered 2023-05-08 – 2023-05-15 (×8): 40 meq via ORAL
  Filled 2023-05-07 (×9): qty 2

## 2023-05-07 MED ORDER — HYDRALAZINE HCL 50 MG PO TABS
50.0000 mg | ORAL_TABLET | Freq: Three times a day (TID) | ORAL | Status: DC
  Administered 2023-05-08 – 2023-05-16 (×24): 50 mg via ORAL
  Filled 2023-05-07 (×25): qty 1

## 2023-05-07 MED ORDER — GUAIFENESIN-DM 100-10 MG/5ML PO SYRP: 5.0000 mL | ORAL_SOLUTION | Freq: Four times a day (QID) | ORAL | Status: DC | PRN

## 2023-05-07 MED ORDER — DILTIAZEM HCL ER COATED BEADS 180 MG PO CP24
360.0000 mg | ORAL_CAPSULE | Freq: Every day | ORAL | Status: DC
  Administered 2023-05-08 – 2023-05-12 (×4): 360 mg via ORAL
  Filled 2023-05-07 (×5): qty 2

## 2023-05-07 MED ORDER — INSULIN ASPART 100 UNIT/ML IJ SOLN: 0.0000 [IU] | Freq: Every day | INTRAMUSCULAR | Status: DC

## 2023-05-07 MED ORDER — FUROSEMIDE 10 MG/ML IJ SOLN: 20.0000 mg | Freq: Two times a day (BID) | INTRAMUSCULAR | Status: DC

## 2023-05-07 MED ORDER — ASPIRIN 81 MG PO TBEC
81.0000 mg | DELAYED_RELEASE_TABLET | Freq: Every day | ORAL | Status: DC
  Administered 2023-05-08 – 2023-05-16 (×8): 81 mg via ORAL
  Filled 2023-05-07 (×9): qty 1

## 2023-05-07 MED ORDER — ONDANSETRON HCL 4 MG PO TABS: 4.0000 mg | ORAL_TABLET | Freq: Four times a day (QID) | ORAL | Status: DC | PRN

## 2023-05-07 MED ORDER — MELATONIN 5 MG PO TABS
5.0000 mg | ORAL_TABLET | Freq: Every day | ORAL | Status: DC
  Administered 2023-05-08 – 2023-05-15 (×9): 5 mg via ORAL
  Filled 2023-05-07 (×9): qty 1

## 2023-05-07 MED ORDER — NICOTINE 21 MG/24HR TD PT24
21.0000 mg | MEDICATED_PATCH | Freq: Every day | TRANSDERMAL | Status: DC
  Administered 2023-05-08 – 2023-05-16 (×9): 21 mg via TRANSDERMAL
  Filled 2023-05-07 (×9): qty 1

## 2023-05-07 MED ORDER — ACETAMINOPHEN 650 MG RE SUPP: 650.0000 mg | Freq: Four times a day (QID) | RECTAL | Status: DC | PRN

## 2023-05-07 MED ORDER — HEPARIN SODIUM (PORCINE) 5000 UNIT/ML IJ SOLN
5000.0000 [IU] | Freq: Three times a day (TID) | INTRAMUSCULAR | Status: DC
  Administered 2023-05-08 – 2023-05-16 (×24): 5000 [IU] via SUBCUTANEOUS
  Filled 2023-05-07 (×26): qty 1

## 2023-05-07 MED ORDER — CALCITRIOL 0.25 MCG PO CAPS
0.2500 ug | ORAL_CAPSULE | Freq: Every day | ORAL | Status: DC
  Administered 2023-05-08 – 2023-05-16 (×8): 0.25 ug via ORAL
  Filled 2023-05-07 (×9): qty 1

## 2023-05-07 MED ORDER — ESCITALOPRAM OXALATE 10 MG PO TABS
20.0000 mg | ORAL_TABLET | Freq: Every day | ORAL | Status: DC
  Administered 2023-05-08 – 2023-05-16 (×8): 20 mg via ORAL
  Filled 2023-05-07 (×9): qty 2

## 2023-05-07 MED ORDER — ALBUTEROL SULFATE (2.5 MG/3ML) 0.083% IN NEBU: 2.5000 mg | INHALATION_SOLUTION | RESPIRATORY_TRACT | Status: DC | PRN

## 2023-05-07 NOTE — Assessment & Plan Note (Signed)
Possible respiratory tract infection versus urinary tract infection Hypoxia/acute respiratory failure with hypoxia WBC 14,000, with low-grade temp of 99.1 on arrival, with mild hypoxia to 88% in the ED requiring O2 at 2 L Chest x-ray showing hazy airspace disease at bases Follow-up urinalysis as patient complained of dysuria Hold off on antibiotics for now Will get speech eval to evaluate for aspiration given concern for possible aspiration on pulmonology note from August 2024

## 2023-05-07 NOTE — Assessment & Plan Note (Signed)
Renal function at baseline, and without metabolic acidosis Continue sodium bicarb Nephrology consult Patient currently scheduled for vein mapping on 10/8

## 2023-05-07 NOTE — ED Triage Notes (Signed)
See first nurse note- 1-2+ pitting edema noted in legs bilaterally, upper extremities with non-pitting edema noted.

## 2023-05-07 NOTE — ED Notes (Signed)
Pt o2 sats noted to be 90%RA, placed on 2L.

## 2023-05-07 NOTE — Assessment & Plan Note (Signed)
Anemia of CKD stage V Hemoglobin 8.4, down from 9.1 a month prior Continue to monitor and transfuse if necessary

## 2023-05-07 NOTE — ED Notes (Signed)
Pt encouraged to provide urine per order.

## 2023-05-07 NOTE — Assessment & Plan Note (Signed)
-   Continue home meds °

## 2023-05-07 NOTE — Assessment & Plan Note (Signed)
Not acutely exacerbated Continue home inhalers.  DuoNebs as needed 

## 2023-05-07 NOTE — ED Notes (Signed)
Alicia Rogers, NT to assist with in and out catherization.  Patient has approximately 750 ML output clear, yellow urine. No sediment noted.

## 2023-05-07 NOTE — Assessment & Plan Note (Signed)
Sliding scale insulin coverage 

## 2023-05-07 NOTE — H&P (Signed)
History and Physical    Patient: Alicia Rogers VOZ:[id number] DOB: Dec 27, 1960 DOA: 05/07/2023 DOS: the patient was seen and examined on 05/07/2023 PCP: Patient, No Pcp Per  Patient coming from: ALF/ILF  Chief Complaint:  Chief Complaint  Patient presents with   Leg Swelling    HPI: Alicia Rogers is a 61 y.o. 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  Patient who ambulates with a walker at  baseline. has had no orthopnea or shortness of breath no complaints of chest pain.  Patient was last seen by her nephrologist on 04/07/23 when she was noted to have lower extremity edema and her torsemide was replaced with furosemide at that time. Denies cough, fever or chills or leg pain. Endorses dysuria/pain with urination.  Of note, during hospitalization in January 2024, patient was noted to have a pulmonary nodule which showed complete CT resolution by the time of pulmonology follow-up 03/2023.  Pulmonologist did express some concern for aspiration for which barium swallow was ordered, (not performed) for ongoing cough however patient currently denies  cough ED course and data review: BP 170/59 with low-grade temp of 99.1.  Pulse 81.  O2 sat was 88% on room air for which patient was placed on O2 at 2 L improving to 100%. Labs,: WBC 14,000 with lactic acid 1.1 Hemoglobin 8.4 down from 9.1 a month prior Creatinine 2.85 with normal bicarb, potassium 3.2. BNP 441. Respiratory viral panel and urinalysis pending. EKG, personally viewed and interpreted Chest  x-ray showing small bilateral pleural effusions with hazy airspace disease at the bases, favor atelectasis. Patient given a dose of IV Lasix 40 mg Hospitalist consulted for admission   Review of Systems: {ROS_Text:[id number]}  Past Medical History:  Diagnosis Date   Depression    Diabetes mellitus without complication (HCC)    Diabetes mellitus, type II (HCC)    Hand swelling 09/27/2021   Hypertension    Schizophrenia (HCC)    Stage 3b chronic kidney disease (HCC) 12/22/2021   Past Surgical History:  Procedure Laterality Date   COLONOSCOPY WITH PROPOFOL N/A 07/21/2022   Procedure: COLONOSCOPY WITH PROPOFOL;  Surgeon: Toney Reil, MD;  Location: [facility] ENDOSCOPY;  Service: Gastroenterology;  Laterality: N/A;   colonscopy     ESOPHAGOGASTRODUODENOSCOPY (EGD) WITH PROPOFOL N/A 07/21/2022   Procedure: ESOPHAGOGASTRODUODENOSCOPY (EGD) WITH PROPOFOL;  Surgeon: Toney Reil, MD;  Location: [facility] ENDOSCOPY;  Service: Gastroenterology;  Laterality: N/A;   Social History:  reports that she has been smoking cigarettes. She started smoking about 45 years ago. She has a 45 pack-year smoking history. She has never used smokeless tobacco. She reports that she does not drink alcohol and does not use drugs.  Allergies  Allergen Reactions   Penicillins     Rash   Lisinopril     Family History  Problem Relation Age of Onset   Diabetes Mother    Glaucoma Mother    Heart disease Mother    Heart attack Mother    Depression Mother    Hypertension Mother    Diabetes Father    Glaucoma Father    Hypertension Father  Prostate cancer Father    Diabetes Sister    Depression Sister    Post-traumatic stress disorder Sister     Prior to Admission medications   Medication Sig Start Date End Date Taking? Authorizing Provider  aspirin EC 81 MG tablet Take 81 mg by mouth daily. 11/19/21   [provider]  BREZTRI AEROSPHERE 160-9-4.8 MCG/ACT AERO Inhale 2 puffs into the lungs in  the morning and at bedtime. Start taking after antibiotics are completed. 09/03/22 08/29/23  Miki Kins, FNP  calcitRIOL (ROCALTROL) 0.25 MCG capsule Take 0.25 mcg by mouth daily. 01/14/22   [provider]  cyclobenzaprine (FLEXERIL) 10 MG tablet Take 1 tablet (10 mg total) by mouth at bedtime. 11/10/21   Sarina Ill, DO  dapagliflozin propanediol (FARXIGA) 5 MG TABS tablet Take 5 mg by mouth daily.    [provider]  diltiazem (CARDIZEM CD) 360 MG 24 hr capsule Take 1 capsule (360 mg total) by mouth daily. 11/11/21   Sarina Ill, DO  ergocalciferol (VITAMIN D2) 1.25 MG (50000 UT) capsule Take 50,000 Units by mouth once a week. On Fridays    [provider]  escitalopram (LEXAPRO) 20 MG tablet Take 20 mg by mouth daily. 05/21/22   [provider]  ferrous sulfate 325 (65 FE) MG tablet Take 325 mg by mouth 3 (three) times daily with meals. 12/07/21   [provider]  fluticasone (FLONASE) 50 MCG/ACT nasal spray Place 2 sprays into both nostrils daily. 09/03/22 09/03/23  Miki Kins, FNP  furosemide (LASIX) 20 MG tablet Take 20 mg by mouth daily. 01/14/22   [provider]  gabapentin (NEURONTIN) 100 MG capsule Take 100 mg by mouth 3 (three) times daily. 01/10/22   [provider]  guaiFENesin-dextromethorphan (ROBITUSSIN DM) 100-10 MG/5ML syrup Take 5 mLs by mouth every 6 (six) hours as needed for cough. 09/01/22   Gillis Santa, MD  hydrALAZINE (APRESOLINE) 50 MG tablet Take 1 tablet (50 mg total) by mouth 3 (three) times daily. 09/29/21   Danford, Earl Lites, MD  hydrochlorothiazide (HYDRODIURIL) 25 MG tablet Take 25 mg by mouth daily.    [provider]  INVEGA SUSTENNA injection SMARTSIG:117 Milligram(s) IM Once a Month 12/29/21   [provider]  ketoconazole (NIZORAL) 2 % cream Apply to both feet and between toes once daily for 6 weeks. 05/05/23   Freddie Breech, DPM  linagliptin  (TRADJENTA) 5 MG TABS tablet Take 5 mg by mouth daily.    [provider]  loperamide (IMODIUM) 2 MG capsule Take 2 mg by mouth 2 (two) times daily as needed for diarrhea or loose stools. 03/17/21   [provider]  LORazepam (ATIVAN) 0.5 MG tablet Take 0.5 mg by mouth every 8 (eight) hours. TAKE ONE TABLET BY MOUTH TWICE A DAY FOR 14 DAYS    [provider]  losartan (COZAAR) 100 MG tablet Take 100 mg by mouth daily. 01/12/22   [provider]  melatonin 3 MG TABS tablet Take 6 mg by mouth at bedtime. 12/07/21   [provider]  Multiple Vitamin (MULTIVITAMIN) capsule Take 1 capsule by mouth daily.    [provider]  multivitamin (RENA-VIT) TABS tablet Take 1 tablet by mouth daily.    [provider]  nicotine (NICODERM CQ) 21 mg/24hr patch Place 1 patch (21 mg total) onto the skin daily. 11/03/22   Earna Coder, MD  omeprazole (PRILOSEC) 40 MG capsule Take 40 mg by mouth daily.  [provider]  oxybutynin (DITROPAN XL) 15 MG 24 hr tablet TAKE 1 TABLET BY MOUTH ONCE DAILY FOR BLADDER *WEAR GLOVES IF CRUSHING* 03/17/21   [provider]  polyethylene glycol powder (GLYCOLAX/MIRALAX) 17 GM/SCOOP powder Take by mouth.    [provider]  traMADol (ULTRAM) 50 MG tablet Take 1 tablet (50 mg total) by mouth 2 (two) times daily as needed for moderate pain. 03/16/22   Minna Antis, MD    Physical Exam: Vitals:   05/07/23 2000 05/07/23 2030 05/07/23 2154 05/07/23 2228  BP: (!) 173/64 (!) 165/57    Pulse: 71 66    Resp: 16 16    Temp:   98.5 F (36.9 C) 99 F (37.2 C)  TempSrc:   Oral Oral  SpO2: 91% 100%    Weight:      Height:       Physical Exam  Labs on Admission: I have personally reviewed following labs and imaging studies  CBC: Recent Labs  Lab 05/07/23 1619  WBC 14.6*  NEUTROABS 10.6*  HGB 8.4*  HCT 27.3*  MCV 93.2  PLT 458*   Basic Metabolic Panel: Recent Labs  Lab  05/07/23 1619  NA 138  K 3.2*  CL 99  CO2 27  GLUCOSE 158*  BUN 33*  CREATININE 2.85*  CALCIUM 8.5*   GFR: Estimated Creatinine Clearance: 15.6 mL/min (A) (by C-G formula based on SCr of 2.85 mg/dL (H)). Liver Function Tests: Recent Labs  Lab 05/07/23 1619  AST 22  ALT 22  ALKPHOS 105  BILITOT 0.5  PROT 7.1  ALBUMIN 2.0*   No results for input(s): "LIPASE", "AMYLASE" in the last 168 hours. No results for input(s): "AMMONIA" in the last 168 hours. Coagulation Profile: No results for input(s): "INR", "PROTIME" in the last 168 hours. Cardiac Enzymes: No results for input(s): "CKTOTAL", "CKMB", "CKMBINDEX", "TROPONINI" in the last 168 hours. BNP (last 3 results) No results for input(s): "PROBNP" in the last 8760 hours. HbA1C: No results for input(s): "HGBA1C" in the last 72 hours. CBG: No results for input(s): "GLUCAP" in the last 168 hours. Lipid Profile: No results for input(s): "CHOL", "HDL", "LDLCALC", "TRIG", "CHOLHDL", "LDLDIRECT" in the last 72 hours. Thyroid Function Tests: No results for input(s): "TSH", "T4TOTAL", "FREET4", "T3FREE", "THYROIDAB" in the last 72 hours. Anemia Panel: No results for input(s): "VITAMINB12", "FOLATE", "FERRITIN", "TIBC", "IRON", "RETICCTPCT" in the last 72 hours. Urine analysis:    Component Value Date/Time   COLORURINE YELLOW (A) 02/13/2023 1230   APPEARANCEUR HAZY (A) 02/13/2023 1230   APPEARANCEUR Cloudy (A) 03/11/2020 1629   LABSPEC 1.010 02/13/2023 1230   PHURINE 5.0 02/13/2023 1230   GLUCOSEU 50 (A) 02/13/2023 1230   HGBUR NEGATIVE 02/13/2023 1230   BILIRUBINUR NEGATIVE 02/13/2023 1230   BILIRUBINUR Negative 03/11/2020 1629   KETONESUR NEGATIVE 02/13/2023 1230   PROTEINUR 100 (A) 02/13/2023 1230   NITRITE NEGATIVE 02/13/2023 1230   LEUKOCYTESUR NEGATIVE 02/13/2023 1230    Radiological Exams on Admission: DG Chest 2 View  Result Date: 05/07/2023 CLINICAL DATA:  Bilateral lower extremity edema EXAM: CHEST - 2 VIEW  COMPARISON:  02/13/2023 FINDINGS: Small bilateral pleural effusions. Borderline to mild cardiomegaly. Hazy airspace disease at the bases, favor atelectasis. No pneumothorax IMPRESSION: Small bilateral pleural effusions with hazy airspace disease at the bases, favor atelectasis. Electronically Signed   By: Jasmine Pang M.D.   On: 05/07/2023 18:03     Data Reviewed: Relevant notes from primary care and specialist visits, past discharge summaries as available  in EHR, including Care Everywhere. Prior diagnostic testing as pertinent to current admission diagnoses Updated medications and problem lists for reconciliation ED course, including vitals, labs, imaging, treatment and response to treatment Triage notes, nursing and pharmacy notes and ED provider's notes Notable results as noted in HPI   Assessment and Plan: * Anasarca Stage V CKD with subnephrotic range proteinuria Small bilateral pleural effusions Suspect anasarca is renal and not cardiac BNP elevated over 400 but could be related to renal function Will continue Lasix IV with monitoring of renal function Daily weights with intake and output monitoring Will get echocardiogram  Leukocytosis Possible respiratory tract infection versus urinary tract infection Hypoxia/acute respiratory failure with hypoxia WBC 14,000, with low-grade temp of 99.1 on arrival, with mild hypoxia to 88% in the ED requiring O2 at 2 L Chest x-ray showing hazy airspace disease at bases Follow-up urinalysis as patient complained of dysuria Hold off on antibiotics for now Will get speech eval to evaluate for aspiration given concern for possible aspiration on pulmonology note from August 2024   CKD (chronic kidney disease) stage 5, GFR less than 15 ml/min (HCC) Renal function at baseline, and without metabolic acidosis Continue sodium bicarb Nephrology consult Patient currently scheduled for vein mapping on 10/8  Iron deficiency anemia Anemia of CKD  stage V Hemoglobin 8.4, down from 9.1 a month prior Continue to monitor and transfuse if necessary  Chronic obstructive pulmonary disease (HCC) Not acutely exacerbated Continue home inhalers DuoNebs as needed  Schizoaffective disorder, bipolar type (HCC) Continue home meds  Essential hypertension BP controlled Continue losartan, hydralazine, diltiazem  Diabetes mellitus, type 2 (HCC) Sliding scale insulin coverage  Tobacco use disorder Nicotine patch     DVT prophylaxis: heparin  Consults: Nephrology, Dr. Wynelle Link  Advance Care Planning:   Code Status: Prior   Family Communication: none  Disposition Plan: Back to previous home environment  Severity of Illness: The appropriate patient status for this patient is INPATIENT. Inpatient status is judged to be reasonable and necessary in order to provide the required intensity of service to ensure the patient's safety. The patient's presenting symptoms, physical exam findings, and initial radiographic and laboratory data in the context of their chronic comorbidities is felt to place them at high risk for further clinical deterioration. Furthermore, it is not anticipated that the patient will be medically stable for discharge from the hospital within 2 midnights of admission.   * I certify that at the point of admission it is my clinical judgment that the patient will require inpatient hospital care spanning beyond 2 midnights from the point of admission due to high intensity of service, high risk for further deterioration and high frequency of surveillance required.*  Author: Andris Baumann, MD 05/07/2023 10:34 PM  For on call review [email or web address].

## 2023-05-07 NOTE — ED Provider Notes (Cosign Needed Addendum)
[facility] Emergency Department Provider Note     Event Date/Time   First MD Initiated Contact with Patient 05/07/23 1720     (approximate)   History   Leg Swelling   HPI  Alicia Rogers is a 62 y.o. female with a history of ESRD, CHF, presents to the ED for evaluation of bilateral lower extremity swelling.  Patient who is prescribed Lasix according to staff, has been off of her Lasix for several months.  They would endorse 2 to 3 months of ongoing leg edema.  Patient was apparently restarted on her Lasix last week, but reports no improvement of her edema.  She also endorses some pain with urination, noting that she still makes urine and that her last spontaneous void was significant at about noon.  Patient scheduled for a right arm fistula mapping procedure next week for her dialysis. Patient with a chronic right decubital ulcer with recent wound care and dressing change on Friday, with next appointment on Monday. According to [facility] [employer] [phone]), patient got flu vaccine last week; and is scheduled for AV fistula mapping next week.   Physical Exam   Triage Vital Signs: ED Triage Vitals  Encounter Vitals Group     BP 05/07/23 1619 (!) 170/59     Systolic BP Percentile --      Diastolic BP Percentile --      Pulse Rate 05/07/23 1619 81     Resp 05/07/23 1619 16     Temp 05/07/23 1619 99.1 F (37.3 C)     Temp Source 05/07/23 1619 Oral     SpO2 05/07/23 1619 93 %     Weight 05/07/23 1618 108 lb (49 kg)     Height 05/07/23 1618 5\' 2"  (1.575 m)     Head Circumference --      Peak Flow --      Pain Score 05/07/23 1617 0     Pain Loc --      Pain Education --      Exclude from Growth Chart --     Most recent vital signs: Vitals:   05/07/23 2030 05/07/23 2154  BP: (!) 165/57   Pulse: 66   Resp: 16   Temp:  98.5 F (36.9 C)  SpO2: 100%     General Awake, no distress. NAD HEENT NCAT. PERRL. EOMI. No rhinorrhea. Mucous  membranes are moist.  CV:  Good peripheral perfusion. RRR. 2 + pitting edema BLE RESP:  Normal effort. CTA no wheezes, rales, rhonchi noted ABD:  No distention. Soft, nontender SKIN:  Large 8 cm stage II right hip trochanteric decubital ulcer with dressing partially in place.   ED Results / Procedures / Treatments   Labs (all labs ordered are listed, but only abnormal results are displayed) Labs Reviewed  CBC WITH DIFFERENTIAL/PLATELET - Abnormal; Notable for the following components:      Result Value   WBC 14.6 (*)    RBC 2.93 (*)    Hemoglobin 8.4 (*)    HCT 27.3 (*)    Platelets 458 (*)    Neutro Abs 10.6 (*)    Abs Immature Granulocytes 0.17 (*)    All other components within normal limits  COMPREHENSIVE METABOLIC PANEL - Abnormal; Notable for the following components:   Potassium 3.2 (*)    Glucose, Bld 158 (*)    BUN 33 (*)    Creatinine, Ser 2.85 (*)    Calcium 8.5 (*)  Albumin 2.0 (*)    GFR, Estimated 18 (*)    All other components within normal limits  BRAIN NATRIURETIC PEPTIDE - Abnormal; Notable for the following components:   B Natriuretic Peptide 441.2 (*)    All other components within normal limits  RESP PANEL BY RT-PCR (RSV, FLU A&B, COVID)  RVPGX2  LACTIC ACID, PLASMA  URINALYSIS, ROUTINE W REFLEX MICROSCOPIC   EKG   RADIOLOGY  I personally viewed and evaluated these images as part of my medical decision making, as well as reviewing the written report by the radiologist.  ED Provider Interpretation: Small bilateral pleural effusions noted  DG Chest 2 View  Result Date: 05/07/2023 CLINICAL DATA:  Bilateral lower extremity edema EXAM: CHEST - 2 VIEW COMPARISON:  02/13/2023 FINDINGS: Small bilateral pleural effusions. Borderline to mild cardiomegaly. Hazy airspace disease at the bases, favor atelectasis. No pneumothorax IMPRESSION: Small bilateral pleural effusions with hazy airspace disease at the bases, favor atelectasis. Electronically Signed    By: Jasmine Pang M.D.   On: 05/07/2023 18:03     PROCEDURES:  Critical Care performed: No  Procedures   MEDICATIONS ORDERED IN ED: Medications  furosemide (LASIX) injection 40 mg (40 mg Intravenous Given 05/07/23 2151)     IMPRESSION / MDM / ASSESSMENT AND PLAN / ED COURSE  I reviewed the triage vital signs and the nursing notes.                              Differential diagnosis includes, but is not limited to, viral syndrome, bronchitis including COPD exacerbation, pneumonia, reactive airway disease including asthma, CHF including exacerbation with or without pulmonary/interstitial edema, pneumothorax, ACS, thoracic trauma, and pulmonary embolism.  Patient's presentation is most consistent with acute complicated illness / injury requiring diagnostic workup.  Patient's diagnosis is consistent with acute hypoxic respiratory failure likely due CHF exacerbation.  Patient presented from her facility with concern for bilateral lower extremity edema, despite being restarted on her Lasix.  Patient found to have an elevated white count 14.5 likely etiology.  Normal lactic acid.  BNP is elevated at 400+.  Patient unable to provide urine in the ED as she has CKD/ESRD expected to start dialysis.  Patient denies any complaints other than that related to her chronic decubitus ulcer on the left hip.  She denies any interim cough, congestion, fever, chills, chest pain.  Patient will be admitted to the hospital service for ongoing management of her acute respiratory failure, CHF exacerbation, and hypoalbuminemia.  Patient is understanding agreeable to plan of care at this time.    FINAL CLINICAL IMPRESSION(S) / ED DIAGNOSES   Final diagnoses:  Peripheral edema  Acute on chronic congestive heart failure, unspecified heart failure type (HCC)  Acute hypoxemic respiratory failure (HCC)     Rx / DC Orders   ED Discharge Orders     None        Note:  This document was prepared using Dragon  voice recognition software and may include unintentional dictation errors.    Lissa Hoard, PA-C 05/07/23 2217    Lissa Hoard, PA-C 05/07/23 2219    Dionne Bucy, MD 05/08/23 [phone]

## 2023-05-07 NOTE — Assessment & Plan Note (Addendum)
Stage V CKD with subnephrotic range proteinuria Small bilateral pleural effusions Suspect anasarca is renal and not cardiac BNP elevated over 400 but could be related to renal function Will continue Lasix IV with monitoring of renal function Daily weights with intake and output monitoring Will get echocardiogram

## 2023-05-07 NOTE — ED Notes (Signed)
First nurse note: Pt to ED from group home, [facility] [address] Hx CHF per staff, has been off lasix for months, ongoing leg edema last 2-3 months Both arms, abdomen have edema and has +1 pitting to both legs Put back on 20mg  lasix last week, no improvement to edema Also pain with urination  Is supposed to get mapped for R arm fistula next week for dialysis EMS VS: lungs clear, 165/61, temp 98.7 oral, CBG 192 and hx DM, HR 64 NSR, 95% RA

## 2023-05-07 NOTE — Assessment & Plan Note (Signed)
-  Nicotine patch 

## 2023-05-07 NOTE — Assessment & Plan Note (Signed)
BP controlled Continue losartan, hydralazine, diltiazem

## 2023-05-08 ENCOUNTER — Inpatient Hospital Stay: Payer: 59

## 2023-05-08 DIAGNOSIS — R601 Generalized edema: Secondary | ICD-10-CM

## 2023-05-08 DIAGNOSIS — R600 Localized edema: Secondary | ICD-10-CM | POA: Insufficient documentation

## 2023-05-08 DIAGNOSIS — R338 Other retention of urine: Secondary | ICD-10-CM

## 2023-05-08 DIAGNOSIS — N179 Acute kidney failure, unspecified: Secondary | ICD-10-CM | POA: Diagnosis present

## 2023-05-08 LAB — CBG MONITORING, ED
Glucose-Capillary: 112 mg/dL — ABNORMAL HIGH (ref 70–99)
Glucose-Capillary: 130 mg/dL — ABNORMAL HIGH (ref 70–99)
Glucose-Capillary: 159 mg/dL — ABNORMAL HIGH (ref 70–99)
Glucose-Capillary: 163 mg/dL — ABNORMAL HIGH (ref 70–99)

## 2023-05-08 LAB — CBC
HCT: 27.9 % — ABNORMAL LOW (ref 36.0–46.0)
Hemoglobin: 8.3 g/dL — ABNORMAL LOW (ref 12.0–15.0)
MCH: 27.8 pg (ref 26.0–34.0)
MCHC: 29.7 g/dL — ABNORMAL LOW (ref 30.0–36.0)
MCV: 93.3 fL (ref 80.0–100.0)
Platelets: 480 10*3/uL — ABNORMAL HIGH (ref 150–400)
RBC: 2.99 MIL/uL — ABNORMAL LOW (ref 3.87–5.11)
RDW: 14.3 % (ref 11.5–15.5)
WBC: 13.3 10*3/uL — ABNORMAL HIGH (ref 4.0–10.5)
nRBC: 0 % (ref 0.0–0.2)

## 2023-05-08 LAB — BASIC METABOLIC PANEL
Anion gap: 11 (ref 5–15)
BUN: 31 mg/dL — ABNORMAL HIGH (ref 8–23)
CO2: 27 mmol/L (ref 22–32)
Calcium: 8.5 mg/dL — ABNORMAL LOW (ref 8.9–10.3)
Chloride: 102 mmol/L (ref 98–111)
Creatinine, Ser: 2.65 mg/dL — ABNORMAL HIGH (ref 0.44–1.00)
GFR, Estimated: 20 mL/min — ABNORMAL LOW (ref >60)
Glucose, Bld: 249 mg/dL — ABNORMAL HIGH (ref 70–99)
Potassium: 3.6 mmol/L (ref 3.5–5.1)
Sodium: 140 mmol/L (ref 135–145)

## 2023-05-08 LAB — HEMOGLOBIN A1C
Hgb A1c MFr Bld: 7.6 % — ABNORMAL HIGH (ref 4.8–5.6)
Mean Plasma Glucose: 171.42 mg/dL

## 2023-05-08 LAB — HEPATITIS B SURFACE ANTIGEN: Hepatitis B Surface Ag: NONREACTIVE

## 2023-05-08 LAB — GLUCOSE, CAPILLARY: Glucose-Capillary: 184 mg/dL — ABNORMAL HIGH (ref 70–99)

## 2023-05-08 LAB — PROCALCITONIN: Procalcitonin: 0.21 ng/mL

## 2023-05-08 MED ORDER — FUROSEMIDE 10 MG/ML IJ SOLN
60.0000 mg | Freq: Two times a day (BID) | INTRAMUSCULAR | Status: DC
  Administered 2023-05-08 – 2023-05-10 (×4): 60 mg via INTRAVENOUS
  Filled 2023-05-08 (×4): qty 6

## 2023-05-08 MED ORDER — ENSURE ENLIVE PO LIQD
237.0000 mL | Freq: Two times a day (BID) | ORAL | Status: DC
  Administered 2023-05-09 – 2023-05-16 (×11): 237 mL via ORAL

## 2023-05-08 MED ORDER — UMECLIDINIUM BROMIDE 62.5 MCG/ACT IN AEPB
1.0000 | INHALATION_SPRAY | Freq: Every day | RESPIRATORY_TRACT | Status: DC
  Administered 2023-05-09 – 2023-05-16 (×8): 1 via RESPIRATORY_TRACT
  Filled 2023-05-08 (×3): qty 7

## 2023-05-08 MED ORDER — LABETALOL HCL 5 MG/ML IV SOLN
10.0000 mg | INTRAVENOUS | Status: DC | PRN
  Administered 2023-05-10: 10 mg via INTRAVENOUS
  Filled 2023-05-08: qty 4

## 2023-05-08 MED ORDER — TAMSULOSIN HCL 0.4 MG PO CAPS
0.4000 mg | ORAL_CAPSULE | Freq: Every day | ORAL | Status: DC
  Administered 2023-05-08 – 2023-05-16 (×8): 0.4 mg via ORAL
  Filled 2023-05-08 (×9): qty 1

## 2023-05-08 MED ORDER — FLUTICASONE FUROATE-VILANTEROL 200-25 MCG/ACT IN AEPB
1.0000 | INHALATION_SPRAY | Freq: Every day | RESPIRATORY_TRACT | Status: DC
  Administered 2023-05-09 – 2023-05-16 (×8): 1 via RESPIRATORY_TRACT
  Filled 2023-05-08 (×2): qty 28

## 2023-05-08 NOTE — ED Notes (Signed)
Pt cleaned of urinary incontinence.

## 2023-05-08 NOTE — Progress Notes (Signed)
PROGRESS NOTE    Alicia Rogers  MWN:[id number] DOB: June 11, 1961 DOA: 05/07/2023 PCP: Patient, No Pcp Per    Brief Narrative:   62 y.o. 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  Patient who ambulates with a walker at  baseline. has had no orthopnea or shortness of breath no complaints of chest pain.  Patient was last seen by her nephrologist on 04/07/23 when she was noted to have lower extremity edema and her torsemide was replaced with furosemide at that time. Denies cough, fever or chills or leg pain. Endorses dysuria/pain with urination.  Of note, during hospitalization in January 2024, patient was noted to have a pulmonary nodule which showed complete CT resolution by the time of pulmonology follow-up 03/2023.  Pulmonologist did express some concern for aspiration for which barium swallow was ordered, (not performed) for ongoing cough however patient currently denies  cough   Seen in consultation by nephrology   Assessment & Plan:   Principal Problem:   Anasarca Active Problems:   Hypoxia   Leukocytosis   Acute urinary retention   Iron deficiency anemia   CKD (chronic kidney disease) stage 5, GFR less than 15 ml/min (HCC)   Tobacco use disorder   Diabetes mellitus, type 2 (HCC)   Essential hypertension   Schizoaffective disorder, bipolar type (HCC)   MGUS (monoclonal gammopathy of unknown significance)   Chronic obstructive pulmonary disease (HCC)   Bilateral pleural effusion   Bilateral lower extremity edema  * Anasarca Stage V CKD  with nephrotic range proteinuria Small bilateral pleural effusions Suspect anasarca is renal and not cardiac BNP elevated over 400  Plan: No indication for dialysis acutely IV Lasix 60 mg twice daily Salt and fluid restrict Hold nephrotoxins Follow-up renal ultrasound Check echocardiogram   Acute urinary retention Following admission, patient could not produce a urine specimen and In-N-Out cath yielded 800 mL.  Significant for glucosuria and significant proteinuria Plan: Flomax 0.4 mg daily ordered   Leukocytosis Possible respiratory tract infection versus urinary tract infection Hypoxia/acute respiratory failure with hypoxia WBC 14,000, with low-grade temp of 99.1 on arrival, with mild hypoxia to 88% in the ED requiring O2 at 2 L Chest x-ray showing hazy airspace disease at bases Urinalysis bland Plan: Hold antibiotics Monitor vitals and fever curve SLP evaluation  Iron deficiency anemia Anemia of CKD stage V Hemoglobin 8.4, down from 9.1 a month prior Continue to monitor and transfuse if necessary Currently no indication for transfusion   Chronic obstructive pulmonary disease (HCC) Not acutely exacerbated Continue home inhalers DuoNebs as needed   Schizoaffective disorder, bipolar type (HCC) Continue home meds   Essential hypertension BP controlled Continue losartan, hydralazine, diltiazem   Diabetes mellitus, type 2 (HCC) Sliding scale insulin coverage   Tobacco use disorder Nicotine patch   DVT prophylaxis: SQ heparin Code Status: Full Family Communication: None Disposition Plan: Status is: Inpatient Remains inpatient appropriate because: Multiple acute issues as above   Level of care: Progressive  Consultants:  Nephrology  Procedures:  None  Antimicrobials: None   Subjective: Examined.  Appears very fatigued.  Resting in bed  Objective: Vitals:  05/08/23 1030 05/08/23 1100 05/08/23 1130 05/08/23 1200  BP: (!) 192/72 (!) 187/68 (!)  183/69 (!) 187/74  Pulse: 89 90 88 86  Resp: 19 19 18 20   Temp:      TempSrc:      SpO2: 99% 99% 98% 99%  Weight:      Height:       No intake or output data in the 24 hours ending 05/08/23 1348 Filed Weights   05/07/23 1618  Weight: 49 kg    Examination:  General exam: Fatigued and chronically ill Respiratory system: Crackles bilaterally.  Normal work of breathing.  Room air Cardiovascular system: S S1-S2, RRR, no murmurs, 1+ pitting edema BLE Gastrointestinal system: Thin, soft, NT/ND, normal bowel sounds Central nervous system: Alert and oriented. No focal neurological deficits. Extremities: Symmetric 5 x 5 power. Skin: No rashes, lesions or ulcers Psychiatry: Judgement and insight appear impaired. Mood & affect flattened.     Data Reviewed: I have personally reviewed following labs and imaging studies  CBC: Recent Labs  Lab 05/07/23 1619 05/08/23 0412  WBC 14.6* 13.3*  NEUTROABS 10.6*  --   HGB 8.4* 8.3*  HCT 27.3* 27.9*  MCV 93.2 93.3  PLT 458* 480*   Basic Metabolic Panel: Recent Labs  Lab 05/07/23 1619 05/08/23 0412  NA 138 140  K 3.2* 3.6  CL 99 102  CO2 27 27  GLUCOSE 158* 249*  BUN 33* 31*  CREATININE 2.85* 2.65*  CALCIUM 8.5* 8.5*   GFR: Estimated Creatinine Clearance: 16.8 mL/min (A) (by C-G formula based on SCr of 2.65 mg/dL (H)). Liver Function Tests: Recent Labs  Lab 05/07/23 1619  AST 22  ALT 22  ALKPHOS 105  BILITOT 0.5  PROT 7.1  ALBUMIN 2.0*   No results for input(s): "LIPASE", "AMYLASE" in the last 168 hours. No results for input(s): "AMMONIA" in the last 168 hours. Coagulation Profile: No results for input(s): "INR", "PROTIME" in the last 168 hours. Cardiac Enzymes: No results for input(s): "CKTOTAL", "CKMB", "CKMBINDEX", "TROPONINI" in the last 168 hours. BNP (last 3 results) No results for input(s): "PROBNP" in the last 8760 hours. HbA1C: No results for input(s): "HGBA1C" in the last 72 hours. CBG: Recent Labs   Lab 05/08/23 0029 05/08/23 0819 05/08/23 1143  GLUCAP 159* 163* 130*   Lipid Profile: No results for input(s): "CHOL", "HDL", "LDLCALC", "TRIG", "CHOLHDL", "LDLDIRECT" in the last 72 hours. Thyroid Function Tests: No results for input(s): "TSH", "T4TOTAL", "FREET4", "T3FREE", "THYROIDAB" in the last 72 hours. Anemia Panel: No results for input(s): "VITAMINB12", "FOLATE", "FERRITIN", "TIBC", "IRON", "RETICCTPCT" in the last 72 hours. Sepsis Labs: Recent Labs  Lab 05/07/23 1750 05/08/23 0412  PROCALCITON  --  0.21  LATICACIDVEN 1.1  --     Recent Results (from the past 240 hour(s))  Resp panel by RT-PCR (RSV, Flu A&B, Covid) Anterior Nasal Swab     Status: None   Collection Time: 05/07/23 10:26 PM   Specimen: Anterior Nasal Swab  Result Value Ref Range Status   SARS Coronavirus 2 by RT PCR NEGATIVE NEGATIVE Final    Comment: (NOTE) SARS-CoV-2 target nucleic acids are NOT DETECTED.  The SARS-CoV-2 RNA is generally detectable in upper respiratory specimens during the acute phase of infection. The lowest concentration of SARS-CoV-2 viral copies this assay can detect is 138 copies/mL. A negative result does not preclude SARS-Cov-2 infection and should not be used as the sole basis for treatment or other patient management decisions. A negative result may occur with  improper specimen collection/handling, submission of specimen other than nasopharyngeal swab, presence of viral mutation(s) within the areas targeted by this assay, and inadequate number of viral copies(<138 copies/mL). A negative result must be combined with clinical observations, patient history, and epidemiological information. The expected result is Negative.  Fact Sheet for Patients:  BloggerCourse.com  Fact Sheet for Healthcare Providers:  SeriousBroker.it  This test is no t yet approved or cleared by the [place] FDA and  has been authorized for  detection and/or diagnosis of SARS-CoV-2 by FDA under an Emergency Use Authorization (EUA). This EUA will remain  in effect (meaning this test can be used) for the duration of the COVID-19 declaration under Section 564(b)(1) of the Act, 21 U.S.C.section 360bbb-3(b)(1), unless the authorization is terminated  or revoked sooner.       Influenza A by PCR NEGATIVE NEGATIVE Final   Influenza B by PCR NEGATIVE NEGATIVE Final    Comment: (NOTE) The Xpert Xpress SARS-CoV-2/FLU/RSV plus assay is intended as an aid in the diagnosis of influenza from Nasopharyngeal swab specimens and should not be used as a sole basis for treatment. Nasal washings and aspirates are unacceptable for Xpert Xpress SARS-CoV-2/FLU/RSV testing.  Fact Sheet for Patients: BloggerCourse.com  Fact Sheet for Healthcare Providers: SeriousBroker.it  This test is not yet approved or cleared by the [place] FDA and has been authorized for detection and/or diagnosis of SARS-CoV-2 by FDA under an Emergency Use Authorization (EUA). This EUA will remain in effect (meaning this test can be used) for the duration of the COVID-19 declaration under Section 564(b)(1) of the Act, 21 U.S.C. section 360bbb-3(b)(1), unless the authorization is terminated or revoked.     Resp Syncytial Virus by PCR NEGATIVE NEGATIVE Final    Comment: (NOTE) Fact Sheet for Patients: BloggerCourse.com  Fact Sheet for Healthcare Providers: SeriousBroker.it  This test is not yet approved or cleared by the [place] FDA and has been authorized for detection and/or diagnosis of SARS-CoV-2 by FDA under an Emergency Use Authorization (EUA). This EUA will remain in effect (meaning this test can be used) for the duration of the COVID-19 declaration under Section 564(b)(1) of the Act, 21 U.S.C. section 360bbb-3(b)(1), unless the authorization is  terminated or revoked.  Performed at [facility], [address], [place], [place] [address]          Radiology Studies: US RENAL  Result Date: 05/08/2023 CLINICAL DATA:  Acute renal failure EXAM: RENAL / URINARY TRACT ULTRASOUND COMPLETE COMPARISON:  09/30/2022 CT FINDINGS: Right Kidney: Renal measurements: 9.3 x 4.2 x 4.9 cm = volume: 100 mL. Increased renal echogenicity. No hydronephrosis. Left Kidney: Renal measurements: 9.2 x 5.3 x 4.5 cm = volume: 113 mL. Increased renal echogenicity. No hydronephrosis. Lower pole left renal cyst of 1.3 cm. Bladder: Appears normal for degree of bladder distention. Other: None. IMPRESSION: No hydronephrosis. Increased renal echogenicity, suggesting chronic medical renal disease. Electronically Signed   By: Jeronimo Greaves M.D.   On: 05/08/2023 12:20   DG Chest 2 View  Result Date: 05/07/2023 CLINICAL DATA:  Bilateral lower extremity edema EXAM: CHEST - 2 VIEW COMPARISON:  02/13/2023 FINDINGS: Small bilateral pleural effusions. Borderline to mild cardiomegaly. Hazy airspace disease at the bases, favor atelectasis. No pneumothorax IMPRESSION: Small bilateral pleural effusions with hazy airspace disease at the bases, favor atelectasis. Electronically Signed   By: Jasmine Pang M.D.   On: 05/07/2023 18:03        Scheduled Meds:  aspirin EC  81 mg Oral  Daily   calcitRIOL  0.25 mcg Oral Daily   diltiazem  360 mg Oral Daily   escitalopram  20 mg Oral Daily   fluticasone furoate-vilanterol  1 puff Inhalation Daily   And   umeclidinium bromide  1 puff Inhalation Daily   furosemide  60 mg Intravenous Q12H   heparin  5,000 Units Subcutaneous Q8H   hydrALAZINE  50 mg Oral TID   insulin aspart  0-5 Units Subcutaneous QHS   insulin aspart  0-9 Units Subcutaneous TID WC   losartan  100 mg Oral Daily   melatonin  5 mg Oral QHS   nicotine  21 mg Transdermal Daily   potassium chloride  40 mEq Oral Daily   tamsulosin  0.4 mg Oral Daily    Continuous Infusions:   LOS: 1 day      Tresa Moore, MD [facility]   If 7PM-7AM, please contact night-coverage  05/08/2023, 1:48 PM

## 2023-05-08 NOTE — Progress Notes (Signed)
[facility]  ROUNDING NOTE   Subjective:   [name] was admitted to [facility] on 05/07/2023 for Anasarca [R60.1]  Patient was seen by Dr. Thedore Mins on 9/4 where she was referred for vein mapping for dialysis planning. Her diuretics were adjusted at that time.   Patient presents with increasing shortness of breath and lower extremity edema. Patient claims to be taking all her medications as prescribed but is not keeping a fluid or salt restriction.   Patient is stating she is having more trouble urinating.   Objective:  Vital signs in last 24 hours:  Temp:  [98.5 F (36.9 C)-99.1 F (37.3 C)] 98.9 F (37.2 C) (10/06 0815) Pulse Rate:  [57-89] 89 (10/06 1000) Resp:  [12-19] 16 (10/06 1000) BP: (142-193)/(51-75) 193/75 (10/06 1000) SpO2:  [73 %-100 %] 99 % (10/06 1000) Weight:  [49 kg] 49 kg (10/05 1618)  Weight change:  Filed Weights   05/07/23 1618  Weight: 49 kg    Intake/Output: No intake/output data recorded.   Intake/Output this shift:  No intake/output data recorded.  Physical Exam: General: NAD, laying in bed  Head: Normocephalic, atraumatic. Moist oral mucosal membranes  Eyes: Anicteric, PERRL  Neck: Supple, trachea midline  Lungs:  Clear to auscultation, 2 L [place] O2  Heart: Regular rate and rhythm  Abdomen:  Soft, nontender,   Extremities:  + peripheral edema.  Neurologic: Nonfocal, moving all four extremities  Skin: No lesions  Access: none    Basic Metabolic Panel: Recent Labs  Lab 05/07/23 1619 05/08/23 0412  NA 138 140  K 3.2* 3.6  CL 99 102  CO2 27 27  GLUCOSE 158* 249*  BUN 33* 31*  CREATININE 2.85* 2.65*  CALCIUM 8.5* 8.5*    Liver Function Tests: Recent Labs  Lab 05/07/23 1619  AST 22  ALT 22  ALKPHOS 105  BILITOT 0.5  PROT 7.1  ALBUMIN 2.0*   No results for input(s): "LIPASE", "AMYLASE" in the last 168 hours. No results for input(s): "AMMONIA" in the last 168 hours.  CBC: Recent Labs  Lab 05/07/23 1619  05/08/23 0412  WBC 14.6* 13.3*  NEUTROABS 10.6*  --   HGB 8.4* 8.3*  HCT 27.3* 27.9*  MCV 93.2 93.3  PLT 458* 480*    Cardiac Enzymes: No results for input(s): "CKTOTAL", "CKMB", "CKMBINDEX", "TROPONINI" in the last 168 hours.  BNP: Invalid input(s): "POCBNP"  CBG: Recent Labs  Lab 05/08/23 0029 05/08/23 0819 05/08/23 1143  GLUCAP 159* 163* 130*    Microbiology: Results for orders placed or performed during the hospital encounter of 05/07/23  Resp panel by RT-PCR (RSV, Flu A&B, Covid) Anterior Nasal Swab     Status: None   Collection Time: 05/07/23 10:26 PM   Specimen: Anterior Nasal Swab  Result Value Ref Range Status   SARS Coronavirus 2 by RT PCR NEGATIVE NEGATIVE Final    Comment: (NOTE) SARS-CoV-2 target nucleic acids are NOT DETECTED.  The SARS-CoV-2 RNA is generally detectable in upper respiratory specimens during the acute phase of infection. The lowest concentration of SARS-CoV-2 viral copies this assay can detect is 138 copies/mL. A negative result does not preclude SARS-Cov-2 infection and should not be used as the sole basis for treatment or other patient management decisions. A negative result may occur with  improper specimen collection/handling, submission of specimen other than nasopharyngeal swab, presence of viral mutation(s) within the areas targeted by this assay, and inadequate number of viral copies(<138 copies/mL). A negative result must be  combined with clinical observations, patient history, and epidemiological information. The expected result is Negative.  Fact Sheet for Patients:  BloggerCourse.com  Fact Sheet for Healthcare Providers:  SeriousBroker.it  This test is no t yet approved or cleared by the [place] FDA and  has been authorized for detection and/or diagnosis of SARS-CoV-2 by FDA under an Emergency Use Authorization (EUA). This EUA will remain  in effect (meaning this  test can be used) for the duration of the COVID-19 declaration under Section 564(b)(1) of the Act, 21 U.S.C.section 360bbb-3(b)(1), unless the authorization is terminated  or revoked sooner.       Influenza A by PCR NEGATIVE NEGATIVE Final   Influenza B by PCR NEGATIVE NEGATIVE Final    Comment: (NOTE) The Xpert Xpress SARS-CoV-2/FLU/RSV plus assay is intended as an aid in the diagnosis of influenza from Nasopharyngeal swab specimens and should not be used as a sole basis for treatment. Nasal washings and aspirates are unacceptable for Xpert Xpress SARS-CoV-2/FLU/RSV testing.  Fact Sheet for Patients: BloggerCourse.com  Fact Sheet for Healthcare Providers: SeriousBroker.it  This test is not yet approved or cleared by the [place] FDA and has been authorized for detection and/or diagnosis of SARS-CoV-2 by FDA under an Emergency Use Authorization (EUA). This EUA will remain in effect (meaning this test can be used) for the duration of the COVID-19 declaration under Section 564(b)(1) of the Act, 21 U.S.C. section 360bbb-3(b)(1), unless the authorization is terminated or revoked.     Resp Syncytial Virus by PCR NEGATIVE NEGATIVE Final    Comment: (NOTE) Fact Sheet for Patients: BloggerCourse.com  Fact Sheet for Healthcare Providers: SeriousBroker.it  This test is not yet approved or cleared by the [place] FDA and has been authorized for detection and/or diagnosis of SARS-CoV-2 by FDA under an Emergency Use Authorization (EUA). This EUA will remain in effect (meaning this test can be used) for the duration of the COVID-19 declaration under Section 564(b)(1) of the Act, 21 U.S.C. section 360bbb-3(b)(1), unless the authorization is terminated or revoked.  Performed at [facility], [address] [place], [place] [address]     Coagulation Studies: No  results for input(s): "LABPROT", "INR" in the last 72 hours.  Urinalysis: Recent Labs    05/07/23 2308  COLORURINE YELLOW*  LABSPEC 1.008  PHURINE 7.0  GLUCOSEU >=500*  HGBUR NEGATIVE  BILIRUBINUR NEGATIVE  KETONESUR NEGATIVE  PROTEINUR >=300*  NITRITE NEGATIVE  LEUKOCYTESUR TRACE*      Imaging: DG Chest 2 View  Result Date: 05/07/2023 CLINICAL DATA:  Bilateral lower extremity edema EXAM: CHEST - 2 VIEW COMPARISON:  02/13/2023 FINDINGS: Small bilateral pleural effusions. Borderline to mild cardiomegaly. Hazy airspace disease at the bases, favor atelectasis. No pneumothorax IMPRESSION: Small bilateral pleural effusions with hazy airspace disease at the bases, favor atelectasis. Electronically Signed   By: Jasmine Pang M.D.   On: 05/07/2023 18:03     Medications:     aspirin EC  81 mg Oral Daily   calcitRIOL  0.25 mcg Oral Daily   diltiazem  360 mg Oral Daily   escitalopram  20 mg Oral Daily   fluticasone furoate-vilanterol  1 puff Inhalation Daily   And   umeclidinium bromide  1 puff Inhalation Daily   furosemide  60 mg Intravenous Q12H   heparin  5,000 Units Subcutaneous Q8H   hydrALAZINE  50 mg Oral TID   insulin aspart  0-5 Units Subcutaneous QHS   insulin aspart  0-9 Units Subcutaneous TID WC  losartan  100 mg Oral Daily   melatonin  5 mg Oral QHS   nicotine  21 mg Transdermal Daily   potassium chloride  40 mEq Oral Daily   tamsulosin  0.4 mg Oral Daily   acetaminophen **OR** acetaminophen, albuterol, guaiFENesin-dextromethorphan, ondansetron **OR** ondansetron (ZOFRAN) IV, traMADol  Assessment/ Plan:  [name] is a 62 y.o.  female with schizophrenia, diabetes mellitus type II, hypertension, tobacco use, hyperlipidemia who is admitted to [facility] on 05/07/2023 for Anasarca [R60.1]  Chronic Kidney Disease stage V with nephrotic range proteinuria: baseline creatinine 3.42, GFR of 14 on 8/5. Chronic kidney disease secondary to diabetic nephropathy.   -  no acute indication for dialysis - suspect creatinine is low due to lower muscle mass. - Continue IV furosemide.  - Salt and fluid restriction - holding dapagliflozin, hydrochlorothiazide and sodium bicarbonate.  - check renal ultrasound  Hypertension: with suspected acute exacerbation of congestive heart failure. Volume driven - Pending echocardiogram.  - Current regimen of tamsulosin, hydralazine, and diltiazem.  - Holding hydrochlorothiazide.   Diabetes mellitus type II with chronic kidney disease: noninsulin dependent.  - Holding dapagliflozin - continue glucose control  Secondary Hyperparathyroidism:  - continue calcitriol  Hypokalemia - replace with potassium chloride.    LOS: 1 Marina Boerner 10/6/202411:54 AM

## 2023-05-08 NOTE — Assessment & Plan Note (Addendum)
Following admission, patient could not produce a urine specimen and In-N-Out cath yielded 800 mL Flomax 0.4 mg daily ordered Urinalysis pending at this time

## 2023-05-08 NOTE — ED Notes (Signed)
Snack box and water provided

## 2023-05-08 NOTE — ED Notes (Signed)
Pt cleaned of bowel & urinary incontinence

## 2023-05-09 ENCOUNTER — Inpatient Hospital Stay
Admit: 2023-05-09 | Discharge: 2023-05-09 | Disposition: A | Discharge status: Home / Self Care | Payer: 59 | Attending: Internal Medicine | Admitting: Internal Medicine

## 2023-05-09 ENCOUNTER — Inpatient Hospital Stay: Admit: 2023-05-09 | Payer: 59

## 2023-05-09 ENCOUNTER — Inpatient Hospital Stay: Payer: 59

## 2023-05-09 DIAGNOSIS — R601 Generalized edema: Secondary | ICD-10-CM | POA: Diagnosis not present

## 2023-05-09 DIAGNOSIS — L89229 Pressure ulcer of left hip, unspecified stage: Secondary | ICD-10-CM

## 2023-05-09 DIAGNOSIS — L89200 Pressure ulcer of unspecified hip, unstageable: Secondary | ICD-10-CM

## 2023-05-09 LAB — CBC WITH DIFFERENTIAL/PLATELET
Abs Immature Granulocytes: 0.16 10*3/uL — ABNORMAL HIGH (ref 0.00–0.07)
Basophils Absolute: 0.1 10*3/uL (ref 0.0–0.1)
Basophils Relative: 0 %
Eosinophils Absolute: 0.1 10*3/uL (ref 0.0–0.5)
Eosinophils Relative: 1 %
HCT: 29.5 % — ABNORMAL LOW (ref 36.0–46.0)
Hemoglobin: 9.1 g/dL — ABNORMAL LOW (ref 12.0–15.0)
Immature Granulocytes: 1 %
Lymphocytes Relative: 15 %
Lymphs Abs: 2.3 10*3/uL (ref 0.7–4.0)
MCH: 28.3 pg (ref 26.0–34.0)
MCHC: 30.8 g/dL (ref 30.0–36.0)
MCV: 91.9 fL (ref 80.0–100.0)
Monocytes Absolute: 1 10*3/uL (ref 0.1–1.0)
Monocytes Relative: 6 %
Neutro Abs: 12 10*3/uL — ABNORMAL HIGH (ref 1.7–7.7)
Neutrophils Relative %: 77 %
Platelets: 541 10*3/uL — ABNORMAL HIGH (ref 150–400)
RBC: 3.21 MIL/uL — ABNORMAL LOW (ref 3.87–5.11)
RDW: 14.4 % (ref 11.5–15.5)
WBC: 15.7 10*3/uL — ABNORMAL HIGH (ref 4.0–10.5)
nRBC: 0 % (ref 0.0–0.2)

## 2023-05-09 LAB — HEPATITIS B CORE ANTIBODY, TOTAL: Hep B Core Total Ab: NONREACTIVE

## 2023-05-09 LAB — GLUCOSE, CAPILLARY
Glucose-Capillary: 157 mg/dL — ABNORMAL HIGH (ref 70–99)
Glucose-Capillary: 182 mg/dL — ABNORMAL HIGH (ref 70–99)
Glucose-Capillary: 127 mg/dL — ABNORMAL HIGH (ref 70–99)
Glucose-Capillary: 116 mg/dL — ABNORMAL HIGH (ref 70–99)

## 2023-05-09 LAB — BASIC METABOLIC PANEL
Anion gap: 11 (ref 5–15)
BUN: 30 mg/dL — ABNORMAL HIGH (ref 8–23)
CO2: 27 mmol/L (ref 22–32)
Calcium: 9.2 mg/dL (ref 8.9–10.3)
Chloride: 102 mmol/L (ref 98–111)
Creatinine, Ser: 2.7 mg/dL — ABNORMAL HIGH (ref 0.44–1.00)
GFR, Estimated: 19 mL/min — ABNORMAL LOW (ref >60)
Glucose, Bld: 159 mg/dL — ABNORMAL HIGH (ref 70–99)
Potassium: 4.9 mmol/L (ref 3.5–5.1)
Sodium: 140 mmol/L (ref 135–145)

## 2023-05-09 LAB — MAGNESIUM: Magnesium: 2 mg/dL (ref 1.7–2.4)

## 2023-05-09 LAB — HEPATITIS B CORE ANTIBODY, IGM: Hep B C IgM: NONREACTIVE

## 2023-05-09 MED ORDER — DAKINS (1/4 STRENGTH) 0.125 % EX SOLN
Freq: Two times a day (BID) | CUTANEOUS | Status: DC
  Filled 2023-05-09 (×2): qty 1

## 2023-05-09 MED ORDER — GADOBUTROL 1 MMOL/ML IV SOLN
5.0000 mL | Freq: Once | INTRAVENOUS | Status: AC | PRN
  Administered 2023-05-09: 5 mL via INTRAVENOUS

## 2023-05-09 MED ORDER — SODIUM CHLORIDE 0.9 % IV SOLN
2.0000 g | INTRAVENOUS | Status: DC
  Filled 2023-05-09: qty 12.5

## 2023-05-09 MED ORDER — VANCOMYCIN HCL 750 MG/150ML IV SOLN
750.0000 mg | INTRAVENOUS | Status: DC
  Administered 2023-05-11: 750 mg via INTRAVENOUS
  Filled 2023-05-09: qty 150

## 2023-05-09 MED ORDER — PIPERACILLIN-TAZOBACTAM 3.375 G IVPB
3.3750 g | Freq: Three times a day (TID) | INTRAVENOUS | Status: DC
  Administered 2023-05-09 – 2023-05-12 (×8): 3.375 g via INTRAVENOUS
  Filled 2023-05-09 (×9): qty 50

## 2023-05-09 MED ORDER — BUPROPION HCL ER (SR) 100 MG PO TB12
100.0000 mg | ORAL_TABLET | Freq: Every day | ORAL | Status: DC
  Administered 2023-05-09 – 2023-05-16 (×7): 100 mg via ORAL
  Filled 2023-05-09 (×8): qty 1

## 2023-05-09 MED ORDER — VANCOMYCIN HCL 1250 MG/250ML IV SOLN
1250.0000 mg | Freq: Once | INTRAVENOUS | Status: AC
  Administered 2023-05-09: 1250 mg via INTRAVENOUS
  Filled 2023-05-09: qty 250

## 2023-05-09 MED ORDER — AMLODIPINE BESYLATE 5 MG PO TABS
5.0000 mg | ORAL_TABLET | Freq: Every day | ORAL | Status: DC
  Administered 2023-05-09: 5 mg via ORAL
  Filled 2023-05-09 (×2): qty 1

## 2023-05-09 MED ORDER — DAKINS (1/2 STRENGTH) 0.25 % EX SOLN
Freq: Two times a day (BID) | CUTANEOUS | Status: AC
  Filled 2023-05-09: qty 473

## 2023-05-09 NOTE — Progress Notes (Signed)
Introduced patient to role of Statistician. Patient states she has lived at the group home [facility] for about 1.5 years. Group home provides transportation to/from appointments. Pt states she does get meals, although they "aren't real good." When asked about a primary care provider, patient states she sees Dr. Standley Brooking at [facility]. Denies any unmet SDOH needs at present.

## 2023-05-09 NOTE — Progress Notes (Signed)
[facility]  ROUNDING NOTE   Subjective:   [name] was admitted to [facility] on 05/07/2023 for Anasarca [R60.1] Peripheral edema [R60.0] [name] (acute kidney injury) (HCC) [N17.9] Acute hypoxemic respiratory failure (HCC) [J96.01] Acute on chronic congestive heart failure, unspecified heart failure type [facility]) [I50.9]  Patient was seen by Dr. Thedore Mins on 9/4 where she was referred for vein mapping for dialysis planning. Her diuretics were adjusted at that time.   Patient seen laying in bed Alert and oriented Denies pain or discomfort 2L [place]  Creatinine 2.70 UOP on night shift  Objective:  Vital signs in last 24 hours:  Temp:  [98.3 F (36.8 C)-99.7 F (37.6 C)] 98.3 F (36.8 C) (10/07 0809) Pulse Rate:  [74-89] 78 (10/07 0809) Resp:  [16-19] 18 (10/07 0809) BP: (158-183)/(56-71) 183/66 (10/07 0809) SpO2:  [91 %-100 %] 100 % (10/07 0809) Weight:  [53.4 kg] 53.4 kg (10/07 0500)  Weight change: 4.412 kg Filed Weights   05/07/23 1618 05/09/23 0500  Weight: 49 kg 53.4 kg    Intake/Output: I/O last 3 completed shifts: In: 240 [P.O.:240] Out: 650 [Urine:650]   Intake/Output this shift:  Total I/O In: 0  Out: 650 [Urine:650]  Physical Exam: General: NAD, laying in bed  Head: Normocephalic, atraumatic. Moist oral mucosal membranes  Eyes: Anicteric  Lungs:  Clear to auscultation, 2 L  O2  Heart: Regular rate and rhythm  Abdomen:  Soft, nontender  Extremities:  + peripheral edema.  Neurologic: Alert, moving all four extremities  Skin: No lesions  Access: none    Basic Metabolic Panel: Recent Labs  Lab 05/07/23 1619 05/08/23 0412 05/09/23 0819  NA 138 140 140  K 3.2* 3.6 4.9  CL 99 102 102  CO2 27 27 27   GLUCOSE 158* 249* 159*  BUN 33* 31* 30*  CREATININE 2.85* 2.65* 2.70*  CALCIUM 8.5* 8.5* 9.2  MG  --   --  2.0    Liver Function Tests: Recent Labs  Lab 05/07/23 1619  AST 22  ALT 22  ALKPHOS 105  BILITOT 0.5  PROT 7.1   ALBUMIN 2.0*   No results for input(s): "LIPASE", "AMYLASE" in the last 168 hours. No results for input(s): "AMMONIA" in the last 168 hours.  CBC: Recent Labs  Lab 05/07/23 1619 05/08/23 0412 05/09/23 0819  WBC 14.6* 13.3* 15.7*  NEUTROABS 10.6*  --  12.0*  HGB 8.4* 8.3* 9.1*  HCT 27.3* 27.9* 29.5*  MCV 93.2 93.3 91.9  PLT 458* 480* 541*    Cardiac Enzymes: No results for input(s): "CKTOTAL", "CKMB", "CKMBINDEX", "TROPONINI" in the last 168 hours.  BNP: Invalid input(s): "POCBNP"  CBG: Recent Labs  Lab 05/08/23 1143 05/08/23 1511 05/08/23 2119 05/09/23 0805 05/09/23 1143  GLUCAP 130* 112* 184* 157* 182*    Microbiology: Results for orders placed or performed during the hospital encounter of 05/07/23  Resp panel by RT-PCR (RSV, Flu A&B, Covid) Anterior Nasal Swab     Status: None   Collection Time: 05/07/23 10:26 PM   Specimen: Anterior Nasal Swab  Result Value Ref Range Status   SARS Coronavirus 2 by RT PCR NEGATIVE NEGATIVE Final    Comment: (NOTE) SARS-CoV-2 target nucleic acids are NOT DETECTED.  The SARS-CoV-2 RNA is generally detectable in upper respiratory specimens during the acute phase of infection. The lowest concentration of SARS-CoV-2 viral copies this assay can detect is 138 copies/mL. A negative result does not preclude SARS-Cov-2 infection and should not be used as the  sole basis for treatment or other patient management decisions. A negative result may occur with  improper specimen collection/handling, submission of specimen other than nasopharyngeal swab, presence of viral mutation(s) within the areas targeted by this assay, and inadequate number of viral copies(<138 copies/mL). A negative result must be combined with clinical observations, patient history, and epidemiological information. The expected result is Negative.  Fact Sheet for Patients:  BloggerCourse.com  Fact Sheet for Healthcare Providers:   SeriousBroker.it  This test is no t yet approved or cleared by the [place] FDA and  has been authorized for detection and/or diagnosis of SARS-CoV-2 by FDA under an Emergency Use Authorization (EUA). This EUA will remain  in effect (meaning this test can be used) for the duration of the COVID-19 declaration under Section 564(b)(1) of the Act, 21 U.S.C.section 360bbb-3(b)(1), unless the authorization is terminated  or revoked sooner.       Influenza A by PCR NEGATIVE NEGATIVE Final   Influenza B by PCR NEGATIVE NEGATIVE Final    Comment: (NOTE) The Xpert Xpress SARS-CoV-2/FLU/RSV plus assay is intended as an aid in the diagnosis of influenza from Nasopharyngeal swab specimens and should not be used as a sole basis for treatment. Nasal washings and aspirates are unacceptable for Xpert Xpress SARS-CoV-2/FLU/RSV testing.  Fact Sheet for Patients: BloggerCourse.com  Fact Sheet for Healthcare Providers: SeriousBroker.it  This test is not yet approved or cleared by the [place] FDA and has been authorized for detection and/or diagnosis of SARS-CoV-2 by FDA under an Emergency Use Authorization (EUA). This EUA will remain in effect (meaning this test can be used) for the duration of the COVID-19 declaration under Section 564(b)(1) of the Act, 21 U.S.C. section 360bbb-3(b)(1), unless the authorization is terminated or revoked.     Resp Syncytial Virus by PCR NEGATIVE NEGATIVE Final    Comment: (NOTE) Fact Sheet for Patients: BloggerCourse.com  Fact Sheet for Healthcare Providers: SeriousBroker.it  This test is not yet approved or cleared by the [place] FDA and has been authorized for detection and/or diagnosis of SARS-CoV-2 by FDA under an Emergency Use Authorization (EUA). This EUA will remain in effect (meaning this test can be used) for  the duration of the COVID-19 declaration under Section 564(b)(1) of the Act, 21 U.S.C. section 360bbb-3(b)(1), unless the authorization is terminated or revoked.  Performed at [facility], [address] [place], [place] [address]     Coagulation Studies: No results for input(s): "LABPROT", "INR" in the last 72 hours.  Urinalysis: Recent Labs    05/07/23 2308  COLORURINE YELLOW*  LABSPEC 1.008  PHURINE 7.0  GLUCOSEU >=500*  HGBUR NEGATIVE  BILIRUBINUR NEGATIVE  KETONESUR NEGATIVE  PROTEINUR >=300*  NITRITE NEGATIVE  LEUKOCYTESUR TRACE*      Imaging: US RENAL  Result Date: 05/08/2023 CLINICAL DATA:  Acute renal failure EXAM: RENAL / URINARY TRACT ULTRASOUND COMPLETE COMPARISON:  09/30/2022 CT FINDINGS: Right Kidney: Renal measurements: 9.3 x 4.2 x 4.9 cm = volume: 100 mL. Increased renal echogenicity. No hydronephrosis. Left Kidney: Renal measurements: 9.2 x 5.3 x 4.5 cm = volume: 113 mL. Increased renal echogenicity. No hydronephrosis. Lower pole left renal cyst of 1.3 cm. Bladder: Appears normal for degree of bladder distention. Other: None. IMPRESSION: No hydronephrosis. Increased renal echogenicity, suggesting chronic medical renal disease. Electronically Signed   By: Jeronimo Greaves M.D.   On: 05/08/2023 12:20   DG Chest 2 View  Result Date: 05/07/2023 CLINICAL DATA:  Bilateral lower extremity edema EXAM: CHEST - 2  VIEW COMPARISON:  02/13/2023 FINDINGS: Small bilateral pleural effusions. Borderline to mild cardiomegaly. Hazy airspace disease at the bases, favor atelectasis. No pneumothorax IMPRESSION: Small bilateral pleural effusions with hazy airspace disease at the bases, favor atelectasis. Electronically Signed   By: Jasmine Pang M.D.   On: 05/07/2023 18:03     Medications:    ceFEPime (MAXIPIME) IV     vancomycin     Followed by   Melene Muller ON 05/11/2023] vancomycin      amLODipine  5 mg Oral Daily   aspirin EC  81 mg Oral Daily   buPROPion ER  100 mg  Oral Daily   calcitRIOL  0.25 mcg Oral Daily   diltiazem  360 mg Oral Daily   escitalopram  20 mg Oral Daily   feeding supplement  237 mL Oral BID BM   fluticasone furoate-vilanterol  1 puff Inhalation Daily   And   umeclidinium bromide  1 puff Inhalation Daily   furosemide  60 mg Intravenous Q12H   heparin  5,000 Units Subcutaneous Q8H   hydrALAZINE  50 mg Oral TID   insulin aspart  0-5 Units Subcutaneous QHS   insulin aspart  0-9 Units Subcutaneous TID WC   losartan  100 mg Oral Daily   melatonin  5 mg Oral QHS   nicotine  21 mg Transdermal Daily   potassium chloride  40 mEq Oral Daily   sodium hypochlorite   Irrigation BID   tamsulosin  0.4 mg Oral Daily   acetaminophen **OR** acetaminophen, albuterol, guaiFENesin-dextromethorphan, labetalol, ondansetron **OR** ondansetron (ZOFRAN) IV, traMADol  Assessment/ Plan:  [name] is a 62 y.o.  female with schizophrenia, diabetes mellitus type II, hypertension, tobacco use, hyperlipidemia who is admitted to [facility] on 05/07/2023 for Anasarca [R60.1] Peripheral edema [R60.0] [name] (acute kidney injury) (HCC) [N17.9] Acute hypoxemic respiratory failure (HCC) [J96.01] Acute on chronic congestive heart failure, unspecified heart failure type (HCC) [I50.9]  Chronic Kidney Disease stage V with nephrotic range proteinuria: baseline creatinine 3.42, GFR of 14 on 8/5. Chronic kidney disease secondary to diabetic nephropathy.  Renal ultrasound negative for hydronephrosis.  - Decreased muscle mass may be affecting creatinine.  - Continue IV furosemide.  - Continue salt and fluid restriction - holding dapagliflozin, hydrochlorothiazide and sodium bicarbonate.    Hypertension: with suspected acute exacerbation of congestive heart failure. Volume driven - Pending echocardiogram.  - Current regimen of tamsulosin, hydralazine, and diltiazem.  - Holding hydrochlorothiazide.   Diabetes mellitus type II with chronic kidney disease:  noninsulin dependent.  - Holding dapagliflozin - continue glucose control  Secondary Hyperparathyroidism:  - continue calcitriol - Calcium stable  Hypokalemia - Corrected 4.9   LOS: 2   10/7/20241:02 PM

## 2023-05-09 NOTE — Evaluation (Signed)
Occupational Therapy Evaluation Patient Details Name: Alicia Rogers MRN: [id number] DOB: January 17, 1961 Today's Date: 05/09/2023   History of Present Illness Pt is a 62 y.o. 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   Clinical Impression   Pt seen for OT evaluation this date.  Flat affect but cooperative throughout session.  Increased time to process commands and initiate movement in the Ues/Les.  Pt comes from group home and reports she wants to d/c.  Pt appears to lack awareness of condition, with RN verbalizing concern of extent of pt's bilat hip wounds and importance of wounds needing to be treated.  Pt agreeable to transfer up to chair, but reporting pain in bilat hips and required increased time and assist to move Les and trunk from supine>sit.  Pt initially eager to try to amb to bathroom, but experienced UI in standing and required assist for clean up, ultimately limiting time to amb to bathroom.  RN present and assisted with clean up and did verbalize lasix was a contributor to the UI episode.  Purewick back in place following transfer to chair.  Pt presents with functional decline in ADLs and mobility related to bilat hip pain and general muscle weakness; see below for details.  Pt will benefit from continued skilled OT to maximize safety and indep with daily tasks.      If plan is discharge home, recommend the following: A little help with walking and/or transfers;A lot of help with bathing/dressing/bathroom;Assistance with cooking/housework;Direct  supervision/assist for medications management;Assist for transportation;Supervision due to cognitive status;Help with stairs or ramp for entrance    Functional Status Assessment  Patient has had a recent decline in their functional status and demonstrates the ability to make significant improvements in function in a reasonable and predictable amount of time.  Equipment Recommendations  Other (comment) (defer to next venue of care)    Recommendations for Other Services       Precautions / Restrictions Precautions Precautions: Fall Precaution Comments: B hip wounds Restrictions Weight Bearing Restrictions: No      Mobility Bed Mobility Overal bed mobility: Needs Assistance Bed Mobility: Sit to Supine       Sit to supine: Mod assist     Patient Response: Flat affect, Cooperative  Transfers Overall transfer level: Needs assistance Equipment used: Rolling walker (2 wheels) Transfers: Sit to/from Stand, Bed to chair/wheelchair/BSC Sit to Stand: Contact guard assist     Step pivot transfers: Contact guard assist     General transfer comment: increased time for processing commands and initiating movement      Balance Overall balance assessment: Needs assistance Sitting-balance support: No upper extremity supported, Feet supported Sitting balance-Leahy Scale: Fair Sitting balance - Comments: Pt was asked to attempt reaching below knees while sitting edge of recliner but made no weight shift to attempt, stating, "I can't."  To be further tested.   Standing balance support: Single extremity supported, Reliant on assistive device for balance, During functional activity Standing balance-Leahy Scale: Fair Standing balance comment: Participated in clothing management and minimally in peri care in standing  ADL either performed or assessed with clinical judgement   ADL Overall ADL's : Needs assistance/impaired         Upper Body  Bathing: Moderate assistance;Sitting Upper Body Bathing Details (indicate cue type and reason): anticipate mod A for thoroughness Lower Body Bathing: Maximal assistance Lower Body Bathing Details (indicate cue type and reason): Pt demonstrates inability to reach below knees in sitting Upper Body Dressing : Moderate assistance Upper Body Dressing Details (indicate cue type and reason): limitations in bilat shoulder AROM (<90 degrees flexion bilaterally), inability to reach behind back or head Lower Body Dressing: Maximal assistance Lower Body Dressing Details (indicate cue type and reason): inability to reach below knees in sitting     Toileting- Clothing Manipulation and Hygiene: Maximal assistance Toileting - Clothing Manipulation Details (indicate cue type and reason): Pt participated minimally in peri care following UI in standing.  Anticipate total assist for hygiene after a BM.     Functional mobility during ADLs: Minimal assistance;Rolling walker (2 wheels) General ADL Comments: ambulatory at bedside with increased time using RW and min guard     Vision Patient Visual Report: No change from baseline       Perception         Praxis Praxis: Impaired Praxis Impairment Details: Motor planning, Initiation Praxis-Other Comments: Increased time to initiate UE/LE movements.  Noted moderate resting tremor in L hand while pt held phone in the R hand.  Tremor present only briefly.   Pertinent Vitals/Pain Pain Assessment Pain Assessment: 0-10 Pain Score: 9  Pain Location: bilat hips Pain Descriptors / Indicators: Guarding, Discomfort Pain Intervention(s): Limited activity within patient's tolerance, Monitored during session, Repositioned     Extremity/Trunk Assessment Upper Extremity Assessment Upper Extremity Assessment: Generalized weakness;LUE deficits/detail LUE Deficits / Details: L hand flexion contracture; limited to holding walker with L index and thumb only (R hand able to  fully ext and flex digits 1-5)   Lower Extremity Assessment Lower Extremity Assessment: Generalized weakness       Communication Communication Communication: Other (comment) (requires increased time to process 1 step commands) Cueing Techniques: Verbal cues;Tactile cues   Cognition Arousal: Alert Behavior During Therapy: Flat affect Overall Cognitive Status: No family/caregiver present to determine baseline cognitive functioning                                       General Comments  Bilat hip ulcers    Exercises     Shoulder Instructions      Home Living Family/patient expects to be discharged to:: Group home Living Arrangements: Group Home   Type of Home: Group Home Home Access: Ramped entrance     Home Layout: One level               Home Equipment: Rollator (4 wheels)   Additional Comments: Pt reports that she has a RW but does not usually use it at home      Prior Functioning/Environment Prior Level of Function : Independent/Modified Independent;Patient poor historian/Family not available             Mobility Comments: uses rollator to "walk to porch" but otherwise no AD ADLs Comments: Pt reports she requires help with all ADLs, UB and LB        OT Problem List: Decreased strength;Decreased activity tolerance;Decreased knowledge of use of DME or AE;Impaired UE functional use;Decreased range of motion;Impaired balance (sitting and/or standing);Decreased  safety awareness;Pain      OT Treatment/Interventions: Self-care/ADL training;Balance training;Therapeutic exercise;DME and/or AE instruction;Therapeutic activities;Patient/family education    OT Goals(Current goals can be found in the care plan section) Acute Rehab OT Goals Patient Stated Goal: To go home OT Goal Formulation: With patient Time For Goal Achievement: 05/23/23 Potential to Achieve Goals: Fair ADL Goals Pt Will Perform Grooming: with min assist;standing Pt Will  Transfer to Toilet: with supervision;ambulating;grab bars Pt Will Perform Toileting - Clothing Manipulation and hygiene: with supervision;sit to/from stand  OT Frequency: Min 1X/week    Co-evaluation              AM-PAC OT "6 Clicks" Daily Activity     Outcome Measure Help from another person eating meals?: A Little Help from another person taking care of personal grooming?: A Lot Help from another person toileting, which includes using toliet, bedpan, or urinal?: A Lot Help from another person bathing (including washing, rinsing, drying)?: A Lot Help from another person to put on and taking off regular upper body clothing?: A Lot Help from another person to put on and taking off regular lower body clothing?: Total 6 Click Score: 12   End of Session Equipment Utilized During Treatment: Gait belt;Rolling walker (2 wheels) Nurse Communication: Mobility status  Activity Tolerance: Patient tolerated treatment well Patient left: in chair;with call bell/phone within reach;with chair alarm set  OT Visit Diagnosis: Unsteadiness on feet (R26.81);Muscle weakness (generalized) (M62.81);Pain Pain - Right/Left: Left (bilat hips) Pain - part of body: Hip                Time: 1110-1140 OT Time Calculation (min): 30 min Charges:  OT General Charges $OT Visit: 1 Visit OT Evaluation $OT Eval Moderate Complexity: 1 Mod  Danelle Earthly, MS, OTR/L [place] Dials 05/09/2023, 2:41 PM

## 2023-05-09 NOTE — Evaluation (Signed)
Physical Therapy Evaluation Patient Details Name: Alicia Rogers MRN: [id number] DOB: 05-15-1961 Today's Date: 05/09/2023  History of Present Illness  Pt is a 62 y.o. 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  Clinical Impression   Pt is received in recliner, she is agreeable to PT session. At baseline, Pt reports using rollator to "walk to porch" but otherwise no AD and requires assist with IADL/ADLs. Pt is slightly confused and requires extended time to respond to questions-no one at bedside to verify subjective intake. Pt performs bed mobility and transfers close sup A for safety. Unable to further assess mobility at this time secondary to transport interrupting session to take Pt for MRI. Pt would benefit from skilled PT to address above deficits and promote optimal return to PLOF.      If plan is discharge home, recommend the following: A little help with walking and/or transfers;A little help with bathing/dressing/bathroom;Assist for transportation;Help with stairs or ramp for entrance;Supervision due to cognitive status   Can travel by private vehicle        Equipment Recommendations Rolling walker (2 wheels)  Recommendations for Other Services       Functional Status Assessment Patient has had a recent decline in their functional status and demonstrates the ability to make significant improvements in function in a reasonable and predictable amount of time.     Precautions / Restrictions Precautions Precautions: Fall Precaution  Comments: B hip wounds Restrictions Weight Bearing Restrictions: No      Mobility  Bed Mobility Overal bed mobility: Needs Assistance Bed Mobility: Sit to Supine       Sit to supine: Supervision   General bed mobility comments: able to perform bed mobility sup A for safety; cuing to initiate task    Transfers Overall transfer level: Needs assistance Equipment used: Rolling walker (2 wheels) Transfers: Bed to chair/wheelchair/BSC     Step pivot transfers: Supervision       General transfer comment: able to perform STS without AD but requires AD for SPT; frequent cuing for initiation of SPT task and AD management    Ambulation/Gait               General Gait Details: Deferred at this time due to transport tech needing to take Pt to imaging  Stairs            Wheelchair Mobility     Tilt Bed    Modified Rankin (Stroke Patients Only)       Balance Overall balance assessment: Needs assistance Sitting-balance support: No upper extremity supported, Feet supported Sitting balance-Leahy Scale: Good Sitting balance - Comments: able to maintain seated balance at edge of recliner without BUE support   Standing balance support: Single extremity supported, No upper extremity supported, During functional activity Standing balance-Leahy Scale: Fair Standing balance comment: able to maintain static standing balance but requires 1 UE support during dynamic standing balance                             Pertinent Vitals/Pain Pain Assessment Pain Assessment: No/denies pain  Home Living Family/patient expects to be discharged to:: Group home Living Arrangements: Group Home   Type of Home: Group Home Home Access: Ramped entrance       Home Layout: One level Home Equipment: Rollator (4 wheels) Additional Comments: Pt reports she lives in group home and using rollator for limited community amb; unable to verify information at this time     Prior Function Prior Level of Function : Independent/Modified Independent;Patient poor historian/Family not available             Mobility Comments: uses rollator to "walk to porch" but otherwise no AD ADLs Comments: mod I     Extremity/Trunk Assessment   Upper Extremity Assessment Upper Extremity Assessment: Defer to OT evaluation    Lower Extremity Assessment Lower Extremity Assessment: Generalized weakness       Communication   Communication Communication: No apparent difficulties Cueing Techniques: Verbal cues;Tactile cues  Cognition Arousal: Alert Behavior During Therapy: Flat affect Overall Cognitive Status: No family/caregiver present to determine baseline cognitive functioning                                 General Comments: Pleasant and cooperative for PT; requires extensive time to answer to questions        General Comments      Exercises     Assessment/Plan    PT Assessment Patient needs continued PT services  PT Problem List Decreased strength;Decreased mobility;Decreased coordination;Decreased range of motion;Decreased activity tolerance;Decreased balance;Decreased cognition       PT Treatment Interventions DME instruction;Gait training;Stair training;Functional mobility training;Therapeutic activities;Therapeutic exercise;Balance training    PT Goals (Current goals can be found in the Care Plan section)  Acute Rehab PT Goals Patient Stated Goal: to get better PT Goal Formulation: With patient Time For Goal Achievement: 05/23/23 Potential to Achieve Goals: Good    Frequency Min 1X/week     Co-evaluation               AM-PAC PT "6 Clicks" Mobility  Outcome Measure Help needed turning from your back to your side while in a flat bed without using bedrails?: None Help needed moving from lying on your back to sitting on the side of a flat bed without using bedrails?: None Help needed moving to and from a bed to a chair  (including a wheelchair)?: None Help needed standing up from a chair using your arms (e.g., wheelchair or bedside chair)?: None Help needed to walk in hospital room?: A Little Help needed climbing 3-5 steps with a railing? : A Lot 6 Click Score: 21    End of Session   Activity Tolerance: Other (comment) (Patient limited by needing to go to imaging) Patient left: in bed;with call bell/phone within reach;with nursing/sitter in room Nurse Communication: Mobility status PT Visit Diagnosis: Unsteadiness on feet (R26.81);Muscle weakness (generalized) (M62.81);Pain Pain - Right/Left: Left Pain - part of body: Hip    Time: 8657-8469 PT Time Calculation (min) (ACUTE ONLY): 9 min   Charges:                 Elmon Else, SPT   Merdith Adan 05/09/2023, 1:44 PM

## 2023-05-09 NOTE — Progress Notes (Signed)
Per Dr Carlena Hurl, dc tele monitoring

## 2023-05-09 NOTE — Plan of Care (Signed)
  Problem: Education: Goal: Ability to demonstrate management of disease process will improve Outcome: Progressing Goal: Ability to verbalize understanding of medication therapies will improve Outcome: Progressing Goal: Individualized Educational Video(s) Outcome: Progressing   Problem: Activity: Goal: Capacity to carry out activities will improve Outcome: Progressing   Problem: Cardiac: Goal: Ability to achieve and maintain adequate cardiopulmonary perfusion will improve Outcome: Progressing   Problem: Education: Goal: Ability to describe self-care measures that may prevent or decrease complications (Diabetes Survival Skills Education) will improve Outcome: Progressing Goal: Individualized Educational Video(s) Outcome: Progressing   Problem: Health Behavior/Discharge Planning: Goal: Ability to identify and utilize available resources and services will improve Outcome: Progressing Goal: Ability to manage health-related needs will improve Outcome: Progressing

## 2023-05-09 NOTE — Progress Notes (Addendum)
Pharmacy Antibiotic Note  Alicia Rogers is a 62 y.o. female admitted on 05/07/2023 with  wound infection .  Pharmacy has been consulted for vancomycin and cefepime dosing.  Plan: Day 1 of antibiotics Give vancomycin 1250 mg IV x1 followed by 750 mg IV Q48H. Goal AUC 400-550. Expected AUC: 530.1 Expected Css min: 14.0 SCr used: 2.7  Weight used: IBW, Vd used: 0.72 (BMI 21.53) Give Zosyn 3.375 g IV Q8H Continue to monitor renal function and follow culture results   Height: 5\' 2"  (157.5 cm) Weight: 53.4 kg (117 lb 11.6 oz) IBW/kg (Calculated) : 50.1  Temp (24hrs), Avg:99 F (37.2 C), Min:98.3 F (36.8 C), Max:99.7 F (37.6 C)  Recent Labs  Lab 05/07/23 1619 05/07/23 1750 05/08/23 0412 05/09/23 0819  WBC 14.6*  --  13.3* 15.7*  CREATININE 2.85*  --  2.65* 2.70*  LATICACIDVEN  --  1.1  --   --     Estimated Creatinine Clearance: 16.9 mL/min (A) (by C-G formula based on SCr of 2.7 mg/dL (H)).    Allergies  Allergen Reactions   Penicillins     Rash   Lisinopril     Antimicrobials this admission: 10/7 Vanc >>  10/7 Cefepime >>   Dose adjustments this admission: N/A  Microbiology results: None  Thank you for allowing pharmacy to be a part of this patient's care.  Merryl Hacker, PharmD Clinical Pharmacist 05/09/2023 10:49 AM

## 2023-05-09 NOTE — Progress Notes (Signed)
PROGRESS NOTE    Alicia Rogers  OZD:[id number] DOB: 1960-08-19 DOA: 05/07/2023 PCP: Patient, No Pcp Per    Brief Narrative:   62 y.o. 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  Patient who ambulates with a walker at  baseline. has had no orthopnea or shortness of breath no complaints of chest pain.  Patient was last seen by her nephrologist on 04/07/23 when she was noted to have lower extremity edema and her torsemide was replaced with furosemide at that time. Denies cough, fever or chills or leg pain. Endorses dysuria/pain with urination.  Of note, during hospitalization in January 2024, patient was noted to have a pulmonary nodule which showed complete CT resolution by the time of pulmonology follow-up 03/2023.  Pulmonologist did express some concern for aspiration for which barium swallow was ordered, (not performed) for ongoing cough however patient currently denies  cough   Seen in consultation by nephrology  WOC involved for evaluation of bilateral hip wounds.  Noted to have unstageable left hip/trochanter wound with foul-smelling tan-colored drainage.  Recommend surgical evaluation.  Surgical consult requested.   Assessment & Plan:   Principal Problem:   Anasarca Active Problems:   Hypoxia   Leukocytosis   Acute urinary retention   Iron deficiency anemia   CKD (chronic kidney disease) stage 5, GFR less than 15 ml/min (HCC)   Tobacco use disorder   Diabetes mellitus, type 2 (HCC)   Essential hypertension   Schizoaffective disorder,  bipolar type (HCC)   MGUS (monoclonal gammopathy of unknown significance)   Chronic obstructive pulmonary disease (HCC)   Bilateral pleural effusion   Bilateral lower extremity edema   [name] (acute kidney injury) (HCC)  * Anasarca Stage V CKD with nephrotic range proteinuria Small bilateral pleural effusions Suspect anasarca is renal and not cardiac BNP elevated over 400  Urine output poor No hydronephrosis noted on ultrasound Plan: No indication for dialysis acutely Continue IV Lasix 60 mg twice daily Salt and fluid restrict Hold nephrotoxins 2D echocardiogram pending   Acute urinary retention Following admission, patient could not produce a urine specimen and In-N-Out cath yielded 800 mL.  Significant for glucosuria and significant proteinuria Plan: Flomax 0.4 mg daily ordered   Leukocytosis Possible respiratory tract infection versus urinary tract infection Hypoxia/acute respiratory failure with hypoxia Full-thickness left hip wound with drainage WBC 14,000, with low-grade temp of 99.1 on arrival, with mild hypoxia to 88% in the ED requiring O2 at 2 L.  WOC noted left hip wound with foul-smelling drainage Chest x-ray showing hazy airspace disease at bases Urinalysis bland Plan: Wound culture Broad-spectrum antibiotics after wound culture Surgical consultation requested Will likely need cross-sectional imaging Monitor vitals and fever curve SLP evaluation  Iron deficiency anemia Anemia of CKD stage V Hemoglobin 8.4, down from 9.1 a month prior Continue to monitor and transfuse if necessary Currently no indication for transfusion Monitor and transfuse as necessary.  Goal hemoglobin 7   Chronic obstructive pulmonary disease (HCC) Not acutely exacerbated Continue home inhalers DuoNebs as needed   Schizoaffective disorder, bipolar type (HCC) Continue home meds   Essential hypertension Suboptimal control Continue  losartan, hydralazine, diltiazem And amlodipine 5  mg daily   Diabetes mellitus, type 2 (HCC) Sliding scale insulin coverage   Tobacco use disorder Nicotine patch   DVT prophylaxis: SQ heparin Code Status: Full Family Communication: None Disposition Plan: Status is: Inpatient Remains inpatient appropriate because: Multiple acute issues as above   Level of care: Telemetry Medical  Consultants:  Nephrology  Procedures:  None  Antimicrobials: None   Subjective: Seen and examined.  Appears fatigued and chronically ill  Objective: Vitals:   05/09/23 0014 05/09/23 0459 05/09/23 0500 05/09/23 0809  BP: (!) 159/70 (!) 171/71  (!) 183/66  Pulse: 81 74  78  Resp: 18 17  18   Temp: 98.8 F (37.1 C) 98.7 F (37.1 C)  98.3 F (36.8 C)  TempSrc: Oral Oral    SpO2: 99% 91%  100%  Weight:   53.4 kg   Height:        Intake/Output Summary (Last 24 hours) at 05/09/2023 1114 Last data filed at 05/09/2023 1030 Gross per 24 hour  Intake 240 ml  Output 650 ml  Net -410 ml   Filed Weights   05/07/23 1618 05/09/23 0500  Weight: 49 kg 53.4 kg    Examination:  General exam: Chronically ill-appearing Respiratory system: Bibasilar crackles.  Normal work of breathing.  Room air Cardiovascular system: S S1-S2, RRR, no murmurs, 1+ pitting edema BLE Gastrointestinal system: Thin, soft, NT/ND, normal bowel sounds Central nervous system: Alert.  Oriented x 2.  No focal deficits Extremities: Symmetric 5 x 5 power. Skin: No rashes, lesions or ulcers Psychiatry: Judgement and insight appear impaired. Mood & affect flattened.     Data Reviewed: I have personally reviewed following labs and imaging studies  CBC: Recent Labs  Lab 05/07/23 1619 05/08/23 0412 05/09/23 0819  WBC 14.6* 13.3* 15.7*  NEUTROABS 10.6*  --  12.0*  HGB 8.4* 8.3* 9.1*  HCT 27.3* 27.9* 29.5*  MCV 93.2 93.3 91.9  PLT 458* 480* 541*   Basic Metabolic Panel: Recent Labs  Lab 05/07/23 1619 05/08/23 0412 05/09/23 0819  NA 138 140 140  K 3.2* 3.6 4.9   CL 99 102 102  CO2 27 27 27   GLUCOSE 158* 249* 159*  BUN 33* 31* 30*  CREATININE 2.85* 2.65* 2.70*  CALCIUM 8.5* 8.5* 9.2  MG  --   --  2.0   GFR: Estimated Creatinine Clearance: 16.9 mL/min (A) (by C-G formula based on SCr of 2.7 mg/dL (H)). Liver Function Tests: Recent Labs  Lab 05/07/23 1619  AST 22  ALT 22  ALKPHOS 105  BILITOT 0.5  PROT 7.1  ALBUMIN 2.0*   No results for input(s): "LIPASE", "AMYLASE" in the last 168 hours. No results for input(s): "AMMONIA" in the last 168 hours. Coagulation Profile: No results for input(s): "INR", "PROTIME" in the last 168 hours. Cardiac Enzymes: No results for input(s): "CKTOTAL", "CKMB", "CKMBINDEX", "TROPONINI" in the last 168 hours. BNP (last 3 results) No results for input(s): "PROBNP" in the last 8760 hours. HbA1C: Recent Labs    05/08/23 0412  HGBA1C 7.6*   CBG: Recent Labs  Lab 05/08/23 0819 05/08/23 1143 05/08/23 1511 05/08/23 2119 05/09/23 0805  GLUCAP 163* 130* 112* 184* 157*   Lipid Profile: No results for input(s): "CHOL", "HDL", "LDLCALC", "TRIG", "CHOLHDL", "LDLDIRECT" in the last 72 hours. Thyroid Function Tests: No results for input(s): "TSH", "T4TOTAL", "FREET4", "T3FREE", "THYROIDAB" in the last 72 hours. Anemia Panel: No results for input(s): "VITAMINB12", "FOLATE", "FERRITIN", "TIBC", "IRON", "RETICCTPCT" in the  last 72 hours. Sepsis Labs: Recent Labs  Lab 05/07/23 1750 05/08/23 0412  PROCALCITON  --  0.21  LATICACIDVEN 1.1  --     Recent Results (from the past 240 hour(s))  Resp panel by RT-PCR (RSV, Flu A&B, Covid) Anterior Nasal Swab     Status: None   Collection Time: 05/07/23 10:26 PM   Specimen: Anterior Nasal Swab  Result Value Ref Range Status   SARS Coronavirus 2 by RT PCR NEGATIVE NEGATIVE Final    Comment: (NOTE) SARS-CoV-2 target nucleic acids are NOT DETECTED.  The SARS-CoV-2 RNA is generally detectable in upper respiratory specimens during the acute phase of infection.  The lowest concentration of SARS-CoV-2 viral copies this assay can detect is 138 copies/mL. A negative result does not preclude SARS-Cov-2 infection and should not be used as the sole basis for treatment or other patient management decisions. A negative result may occur with  improper specimen collection/handling, submission of specimen other than nasopharyngeal swab, presence of viral mutation(s) within the areas targeted by this assay, and inadequate number of viral copies(<138 copies/mL). A negative result must be combined with clinical observations, patient history, and epidemiological information. The expected result is Negative.  Fact Sheet for Patients:  BloggerCourse.com  Fact Sheet for Healthcare Providers:  SeriousBroker.it  This test is no t yet approved or cleared by the [place] FDA and  has been authorized for detection and/or diagnosis of SARS-CoV-2 by FDA under an Emergency Use Authorization (EUA). This EUA will remain  in effect (meaning this test can be used) for the duration of the COVID-19 declaration under Section 564(b)(1) of the Act, 21 U.S.C.section 360bbb-3(b)(1), unless the authorization is terminated  or revoked sooner.       Influenza A by PCR NEGATIVE NEGATIVE Final   Influenza B by PCR NEGATIVE NEGATIVE Final    Comment: (NOTE) The Xpert Xpress SARS-CoV-2/FLU/RSV plus assay is intended as an aid in the diagnosis of influenza from Nasopharyngeal swab specimens and should not be used as a sole basis for treatment. Nasal washings and aspirates are unacceptable for Xpert Xpress SARS-CoV-2/FLU/RSV testing.  Fact Sheet for Patients: BloggerCourse.com  Fact Sheet for Healthcare Providers: SeriousBroker.it  This test is not yet approved or cleared by the [place] FDA and has been authorized for detection and/or diagnosis of SARS-CoV-2 by FDA  under an Emergency Use Authorization (EUA). This EUA will remain in effect (meaning this test can be used) for the duration of the COVID-19 declaration under Section 564(b)(1) of the Act, 21 U.S.C. section 360bbb-3(b)(1), unless the authorization is terminated or revoked.     Resp Syncytial Virus by PCR NEGATIVE NEGATIVE Final    Comment: (NOTE) Fact Sheet for Patients: BloggerCourse.com  Fact Sheet for Healthcare Providers: SeriousBroker.it  This test is not yet approved or cleared by the [place] FDA and has been authorized for detection and/or diagnosis of SARS-CoV-2 by FDA under an Emergency Use Authorization (EUA). This EUA will remain in effect (meaning this test can be used) for the duration of the COVID-19 declaration under Section 564(b)(1) of the Act, 21 U.S.C. section 360bbb-3(b)(1), unless the authorization is terminated or revoked.  Performed at [facility], [address], [place], [place] [address]          Radiology Studies: US RENAL  Result Date: 05/08/2023 CLINICAL DATA:  Acute renal failure EXAM: RENAL / URINARY TRACT ULTRASOUND COMPLETE COMPARISON:  09/30/2022 CT FINDINGS: Right Kidney: Renal measurements: 9.3 x 4.2 x 4.9 cm = volume:  100 mL. Increased renal echogenicity. No hydronephrosis. Left Kidney: Renal measurements: 9.2 x 5.3 x 4.5 cm = volume: 113 mL. Increased renal echogenicity. No hydronephrosis. Lower pole left renal cyst of 1.3 cm. Bladder: Appears normal for degree of bladder distention. Other: None. IMPRESSION: No hydronephrosis. Increased renal echogenicity, suggesting chronic medical renal disease. Electronically Signed   By: Jeronimo Greaves M.D.   On: 05/08/2023 12:20   DG Chest 2 View  Result Date: 05/07/2023 CLINICAL DATA:  Bilateral lower extremity edema EXAM: CHEST - 2 VIEW COMPARISON:  02/13/2023 FINDINGS: Small bilateral pleural effusions. Borderline to mild cardiomegaly.  Hazy airspace disease at the bases, favor atelectasis. No pneumothorax IMPRESSION: Small bilateral pleural effusions with hazy airspace disease at the bases, favor atelectasis. Electronically Signed   By: Jasmine Pang M.D.   On: 05/07/2023 18:03        Scheduled Meds:  amLODipine  5 mg Oral Daily   aspirin EC  81 mg Oral Daily   calcitRIOL  0.25 mcg Oral Daily   diltiazem  360 mg Oral Daily   escitalopram  20 mg Oral Daily   feeding supplement  237 mL Oral BID BM   fluticasone furoate-vilanterol  1 puff Inhalation Daily   And   umeclidinium bromide  1 puff Inhalation Daily   furosemide  60 mg Intravenous Q12H   heparin  5,000 Units Subcutaneous Q8H   hydrALAZINE  50 mg Oral TID   insulin aspart  0-5 Units Subcutaneous QHS   insulin aspart  0-9 Units Subcutaneous TID WC   losartan  100 mg Oral Daily   melatonin  5 mg Oral QHS   nicotine  21 mg Transdermal Daily   potassium chloride  40 mEq Oral Daily   sodium hypochlorite   Irrigation BID   tamsulosin  0.4 mg Oral Daily   Continuous Infusions:  ceFEPime (MAXIPIME) IV     vancomycin     Followed by   Melene Muller ON 05/11/2023] vancomycin       LOS: 2 days      Tresa Moore, MD [facility]   If 7PM-7AM, please contact night-coverage  05/09/2023, 11:14 AM

## 2023-05-09 NOTE — Consult Note (Signed)
WOC Nurse Consult Note: Reason for Consult: L hip unstageable R hip blister  Wound type: 1. Unstageable Pressure Injury L trochanter. 2. Evolving Deep tissue Pressure Injury R trochanter  Pressure Injury POA: Yes Measurement: 1.  Unstageable PI L trochanter 3.5 cm x 5 cm 90%tan necrotic foul smelling tissue 10% pink moist  2.  R trochanter Deep Tissue Pressure Injury purple maroon with evolving partial thickness skin loss 1 cm x 3 cm  Wound bed: as above  Drainage (amount, consistency, odor) foul smelling tan exudate L trochanter, none R  Periwound: peeling epithelium to both trochanters  Dressing procedure/placement/frequency: Clean L trochanter wound with NS, apply Dakin's moistened gauze to wound bed  twice daily x 5 days. Cover with dry gauze and silicone foam or ABD pad whichever is preferred.  After 5 days begin Santyl.  Clean R trochanter with NS, apply Xeroform gauze to wound bed daily and cover with silicone foam.  May lift foam daily to replace Xeroform. Change silicone foam q3 days and prn soiling.   Patient ordered low air loss mattress for pressure redistribution and moisture management.    POC discussed with patient, bedside nurse and primary MD.  Surgical consultation recommended for left trochanter due to foul smelling necrotic tissue present.   WOC team will not follow at this time. Re-consult if further needs arise.   Thank you,    Priscella Mann MSN, RN-BC, [employer] [phone]

## 2023-05-09 NOTE — Consult Note (Addendum)
Patient ID: Alicia Rogers, female   DOB: 22-Nov-1960, 62 y.o.   MRN: [id number] CC: Left hIp wound History of Present Illness Alicia Rogers is a 62 y.o. female with PMH as listed below who we are being consulted on for left hip wound. The patient states that she got the hip wound in the hospital though she does not know when. She says that she has not noticed any drainage from the wound. She does have some pain at the site of the open wound. Our WOC has been seeing these and there has been foul smelling tan-colored drainge from them so our team was consulted.   Past Medical History Past Medical History:  Diagnosis Date   Depression    Diabetes mellitus without complication (HCC)    Diabetes mellitus, type II (HCC)    Hand swelling 09/27/2021   Hypertension    Schizophrenia (HCC)    Stage 3b chronic kidney disease (HCC) 12/22/2021       Past Surgical History:  Procedure Laterality Date   COLONOSCOPY WITH PROPOFOL N/A 07/21/2022   Procedure: COLONOSCOPY WITH PROPOFOL;  Surgeon: Toney Reil, MD;  Location: [facility] ENDOSCOPY;  Service: Gastroenterology;  Laterality: N/A;   colonscopy     ESOPHAGOGASTRODUODENOSCOPY (EGD) WITH PROPOFOL N/A 07/21/2022   Procedure: ESOPHAGOGASTRODUODENOSCOPY (EGD) WITH PROPOFOL;  Surgeon: Toney Reil, MD;  Location: [facility] ENDOSCOPY;  Service: Gastroenterology;  Laterality: N/A;    Allergies  Allergen Reactions   Penicillins     Rash   Lisinopril     Current Facility-Administered Medications  Medication Dose Route Frequency Provider Last Rate Last Admin   acetaminophen (TYLENOL) tablet 650 mg  650 mg Oral Q6H PRN Andris Baumann, MD       Or   acetaminophen (TYLENOL) suppository 650 mg  650 mg Rectal Q6H PRN Andris Baumann, MD       albuterol (PROVENTIL) (2.5 MG/3ML) 0.083% nebulizer solution 2.5 mg  2.5 mg Nebulization Q2H PRN Andris Baumann, MD       amLODipine (NORVASC) tablet 5 mg  5 mg Oral Daily Georgeann Oppenheim, Sudheer B, MD   5 mg  at 05/09/23 1504   aspirin EC tablet 81 mg  81 mg Oral Daily Lindajo Royal V, MD   81 mg at 05/09/23 1191   buPROPion ER (WELLBUTRIN SR) 12 hr tablet 100 mg  100 mg Oral Daily Lolita Patella B, MD   100 mg at 05/09/23 1504   calcitRIOL (ROCALTROL) capsule 0.25 mcg  0.25 mcg Oral Daily Lindajo Royal V, MD   0.25 mcg at 05/09/23 4782   diltiazem (CARDIZEM CD) 24 hr capsule 360 mg  360 mg Oral Daily Lindajo Royal V, MD   360 mg at 05/09/23 0938   escitalopram (LEXAPRO) tablet 20 mg  20 mg Oral Daily Lindajo Royal V, MD   20 mg at 05/09/23 0938   feeding supplement (ENSURE ENLIVE / ENSURE PLUS) liquid 237 mL  237 mL Oral BID BM Sreenath, Sudheer B, MD   237 mL at 05/09/23 1458   fluticasone furoate-vilanterol (BREO ELLIPTA) 200-25 MCG/ACT 1 puff  1 puff Inhalation Daily Otelia Sergeant, RPH   1 puff at 05/09/23 1453   And   umeclidinium bromide (INCRUSE ELLIPTA) 62.5 MCG/ACT 1 puff  1 puff Inhalation Daily Otelia Sergeant, RPH   1 puff at 05/09/23 1457   furosemide (LASIX) injection 60 mg  60 mg Intravenous Q12H Lolita Patella B, MD   60 mg at 05/09/23 [phone]  guaiFENesin-dextromethorphan (ROBITUSSIN DM) 100-10 MG/5ML syrup 5 mL  5 mL Oral Q6H PRN Andris Baumann, MD       heparin injection 5,000 Units  5,000 Units Subcutaneous Q8H Andris Baumann, MD   5,000 Units at 05/09/23 1458   hydrALAZINE (APRESOLINE) tablet 50 mg  50 mg Oral TID Andris Baumann, MD   50 mg at 05/09/23 1511   insulin aspart (novoLOG) injection 0-5 Units  0-5 Units Subcutaneous QHS Andris Baumann, MD       insulin aspart (novoLOG) injection 0-9 Units  0-9 Units Subcutaneous TID WC Andris Baumann, MD   2 Units at 05/09/23 1458   labetalol (NORMODYNE) injection 10 mg  10 mg Intravenous Q4H PRN Lolita Patella B, MD       losartan (COZAAR) tablet 100 mg  100 mg Oral Daily Lindajo Royal V, MD   100 mg at 05/09/23 1610   melatonin tablet 5 mg  5 mg Oral QHS Andris Baumann, MD   5 mg at 05/08/23 2111   nicotine (NICODERM CQ  - dosed in mg/24 hours) patch 21 mg  21 mg Transdermal Daily Andris Baumann, MD   21 mg at 05/09/23 0940   ondansetron (ZOFRAN) tablet 4 mg  4 mg Oral Q6H PRN Andris Baumann, MD       Or   ondansetron [facility]) injection 4 mg  4 mg Intravenous Q6H PRN Andris Baumann, MD       piperacillin-tazobactam (ZOSYN) IVPB 3.375 g  3.375 g Intravenous Q8H Hunt, Madison H, RPH 12.5 mL/hr at 05/09/23 1647 3.375 g at 05/09/23 1647   potassium chloride SA (KLOR-CON M) CR tablet 40 mEq  40 mEq Oral Daily Andris Baumann, MD   40 mEq at 05/09/23 9604   sodium hypochlorite (DAKIN'S 1/2 STRENGTH) topical solution   Irrigation BID Lolita Patella B, MD       tamsulosin [facility]) capsule 0.4 mg  0.4 mg Oral Daily Andris Baumann, MD   0.4 mg at 05/09/23 5409   traMADol (ULTRAM) tablet 50 mg  50 mg Oral BID PRN Andris Baumann, MD   50 mg at 05/08/23 1706   [START ON 05/11/2023] vancomycin (VANCOREADY) IVPB 750 mg/150 mL  750 mg Intravenous Q48H Hunt, Madison H, RPH        Family History Family History  Problem Relation Age of Onset   Diabetes Mother    Glaucoma Mother    Heart disease Mother    Heart attack Mother    Depression Mother    Hypertension Mother    Diabetes Father    Glaucoma Father    Hypertension Father    Prostate cancer Father    Diabetes Sister    Depression Sister    Post-traumatic stress disorder Sister        Social History Social History   Tobacco Use   Smoking status: Every Day    Current packs/day: 1.00    Average packs/day: 1 pack/day for 45.0 years (45.0 ttl pk-yrs)    Types: Cigarettes    Start date: 04/21/1978   Smokeless tobacco: Never  Vaping Use   Vaping status: Former  Substance Use Topics   Alcohol use: No    Alcohol/week: 0.0 standard drinks of alcohol   Drug use: No        ROS Full ROS of systems performed and is otherwise negative there than what is stated in the HPI  Physical Exam Blood pressure (!) 152/68,  pulse 79, temperature 98.7 F (37.1  C), temperature source Oral, resp. rate 18, height 5\' 2"  (1.575 m), weight 53.4 kg, SpO2 100%.  Left Hip wound measures approximately 5cm. There is overlying fibrionous tissue tthat is foul smelling. The wound itself is not sensate but the area around it is tender to palpation.     Data Reviewed She does have a leukocytosis to 15,000. I reviewed her MRI and there is no obvious abscess. Her other labs are significant for CKD with cr of 2.7  I have personally reviewed the patient's imaging and medical records.    Assessment    62 year old with a left hip wound that has fibrinous exudate and is foul-smelling.  Plan    I debrided necrotic  tissue at the bedside back to  bleeding, sensate and healthy tissue.  Please see procedure note below.  I left the wound dressed with Dakin solution and we will examine it tomorrow morning.  If there is still necrotic tissue overlying the wound base then she will need to be taken to the operating room as she did not tolerate further debridement.  For that reason I have kept her n.p.o. and we will determine future need for debridement in the morning.  At this time please keep her on antibiotics  Procedure Note: Preprocedure left lateral hip wound, postop procedures the same.  The area debrided measured approximately 5 cm.  The overlying fibrinous tissue was sharply debrided with scissors back to healthy, bleeding tissue. This was debrided down to fascia. An excisional debridement was performed excising all overlying necrotic and fibirnous tissue.  The wound was then dressed with Dakin soaked gauze.  Kandis Cocking 05/09/2023, 6:22 PM

## 2023-05-10 ENCOUNTER — Encounter (INDEPENDENT_AMBULATORY_CARE_PROVIDER_SITE_OTHER): Payer: 59

## 2023-05-10 ENCOUNTER — Ambulatory Visit (INDEPENDENT_AMBULATORY_CARE_PROVIDER_SITE_OTHER): Payer: 59 | Admitting: Vascular Surgery

## 2023-05-10 ENCOUNTER — Other Ambulatory Visit (INDEPENDENT_AMBULATORY_CARE_PROVIDER_SITE_OTHER): Payer: 59

## 2023-05-10 ENCOUNTER — Inpatient Hospital Stay (HOSPITAL_COMMUNITY)
Admit: 2023-05-10 | Discharge: 2023-05-10 | Disposition: A | Discharge status: Home / Self Care | Payer: 59 | Attending: Internal Medicine | Admitting: Internal Medicine

## 2023-05-10 DIAGNOSIS — R601 Generalized edema: Secondary | ICD-10-CM | POA: Diagnosis not present

## 2023-05-10 DIAGNOSIS — I5031 Acute diastolic (congestive) heart failure: Secondary | ICD-10-CM | POA: Diagnosis not present

## 2023-05-10 LAB — CBC WITH DIFFERENTIAL/PLATELET
Abs Immature Granulocytes: 0.11 10*3/uL — ABNORMAL HIGH (ref 0.00–0.07)
Basophils Absolute: 0 10*3/uL (ref 0.0–0.1)
Basophils Relative: 0 %
Eosinophils Absolute: 0.1 10*3/uL (ref 0.0–0.5)
Eosinophils Relative: 1 %
HCT: 30.3 % — ABNORMAL LOW (ref 36.0–46.0)
Hemoglobin: 9.5 g/dL — ABNORMAL LOW (ref 12.0–15.0)
Immature Granulocytes: 1 %
Lymphocytes Relative: 13 %
Lymphs Abs: 1.8 10*3/uL (ref 0.7–4.0)
MCH: 28.4 pg (ref 26.0–34.0)
MCHC: 31.4 g/dL (ref 30.0–36.0)
MCV: 90.7 fL (ref 80.0–100.0)
Monocytes Absolute: 0.7 10*3/uL (ref 0.1–1.0)
Monocytes Relative: 5 %
Neutro Abs: 10.7 10*3/uL — ABNORMAL HIGH (ref 1.7–7.7)
Neutrophils Relative %: 80 %
Platelets: 578 10*3/uL — ABNORMAL HIGH (ref 150–400)
RBC: 3.34 MIL/uL — ABNORMAL LOW (ref 3.87–5.11)
RDW: 14.3 % (ref 11.5–15.5)
WBC: 13.3 10*3/uL — ABNORMAL HIGH (ref 4.0–10.5)
nRBC: 0 % (ref 0.0–0.2)

## 2023-05-10 LAB — BASIC METABOLIC PANEL
Anion gap: 14 (ref 5–15)
BUN: 34 mg/dL — ABNORMAL HIGH (ref 8–23)
CO2: 24 mmol/L (ref 22–32)
Calcium: 9.1 mg/dL (ref 8.9–10.3)
Chloride: 100 mmol/L (ref 98–111)
Creatinine, Ser: 2.8 mg/dL — ABNORMAL HIGH (ref 0.44–1.00)
GFR, Estimated: 18 mL/min — ABNORMAL LOW (ref >60)
Glucose, Bld: 176 mg/dL — ABNORMAL HIGH (ref 70–99)
Potassium: 4.6 mmol/L (ref 3.5–5.1)
Sodium: 138 mmol/L (ref 135–145)

## 2023-05-10 LAB — ECHOCARDIOGRAM COMPLETE
AR max vel: 1.58 cm2
AV Area VTI: 1.7 cm2
AV Area mean vel: 1.59 cm2
AV Mean grad: 10.7 mm[Hg]
AV Peak grad: 19.1 mm[Hg]
Ao pk vel: 2.19 m/s
Area-P 1/2: 3.34 cm2
Height: 62 in
S' Lateral: 2.9 cm
Weight: 1904.77 [oz_av]

## 2023-05-10 LAB — GLUCOSE, CAPILLARY
Glucose-Capillary: 147 mg/dL — ABNORMAL HIGH (ref 70–99)
Glucose-Capillary: 169 mg/dL — ABNORMAL HIGH (ref 70–99)
Glucose-Capillary: 157 mg/dL — ABNORMAL HIGH (ref 70–99)
Glucose-Capillary: 163 mg/dL — ABNORMAL HIGH (ref 70–99)

## 2023-05-10 MED ORDER — FUROSEMIDE 40 MG PO TABS
40.0000 mg | ORAL_TABLET | Freq: Every day | ORAL | Status: DC
  Administered 2023-05-10 – 2023-05-12 (×3): 40 mg via ORAL
  Filled 2023-05-10 (×4): qty 1

## 2023-05-10 MED ORDER — FUROSEMIDE 20 MG PO TABS: 20.0000 mg | ORAL_TABLET | Freq: Every day | ORAL | Status: DC

## 2023-05-10 MED ORDER — AMLODIPINE BESYLATE 10 MG PO TABS: 10.0000 mg | ORAL_TABLET | Freq: Every day | ORAL | Status: DC

## 2023-05-10 NOTE — Progress Notes (Signed)
[facility]  ROUNDING NOTE   Subjective:   [name] was admitted to [facility] on 05/07/2023 for Anasarca [R60.1] Peripheral edema [R60.0] [name] (acute kidney injury) (HCC) [N17.9] Acute hypoxemic respiratory failure (HCC) [J96.01] Acute on chronic congestive heart failure, unspecified heart failure type [facility]) [I50.9]  Patient was seen by Dr. Thedore Rogers on 9/4 where she was referred for vein mapping for dialysis planning. Her diuretics were adjusted at that time.   Patient sitting up in chair Alert and oriented Denies pain Lower extremity edema improved  Creatinine 2.80 UOP 1.45L  Objective:  Vital signs in last 24 hours:  Temp:  [98.2 F (36.8 C)-98.9 F (37.2 C)] 98.4 F (36.9 C) (10/08 0811) Pulse Rate:  [78-87] 87 (10/08 0811) Resp:  [17-18] 18 (10/08 0811) BP: (151-191)/(64-79) 165/79 (10/08 0900) SpO2:  [94 %-100 %] 99 % (10/08 0811) Weight:  [54 kg] 54 kg (10/08 0433)  Weight change: 0.6 kg Filed Weights   05/07/23 1618 05/09/23 0500 05/10/23 0433  Weight: 49 kg 53.4 kg 54 kg    Intake/Output: I/O last 3 completed shifts: In: 240 [P.O.:240] Out: 2100 [Urine:2100]   Intake/Output this shift:  No intake/output data recorded.  Physical Exam: General: NAD, sitting in chair  Head: Normocephalic, atraumatic. Moist oral mucosal membranes  Eyes: Anicteric  Lungs:  Clear to auscultation  Heart: Regular rate and rhythm  Abdomen:  Soft, nontender  Extremities:  Trace peripheral edema.  Neurologic: Alert, moving all four extremities  Skin: No lesions  Access: none    Basic Metabolic Panel: Recent Labs  Lab 05/07/23 1619 05/08/23 0412 05/09/23 0819 05/10/23 0838  NA 138 140 140 138  K 3.2* 3.6 4.9 4.6  CL 99 102 102 100  CO2 27 27 27 24   GLUCOSE 158* 249* 159* 176*  BUN 33* 31* 30* 34*  CREATININE 2.85* 2.65* 2.70* 2.80*  CALCIUM 8.5* 8.5* 9.2 9.1  MG  --   --  2.0  --     Liver Function Tests: Recent Labs  Lab 05/07/23 1619   AST 22  ALT 22  ALKPHOS 105  BILITOT 0.5  PROT 7.1  ALBUMIN 2.0*   No results for input(s): "LIPASE", "AMYLASE" in the last 168 hours. No results for input(s): "AMMONIA" in the last 168 hours.  CBC: Recent Labs  Lab 05/07/23 1619 05/08/23 0412 05/09/23 0819 05/10/23 0838  WBC 14.6* 13.3* 15.7* 13.3*  NEUTROABS 10.6*  --  12.0* 10.7*  HGB 8.4* 8.3* 9.1* 9.5*  HCT 27.3* 27.9* 29.5* 30.3*  MCV 93.2 93.3 91.9 90.7  PLT 458* 480* 541* 578*    Cardiac Enzymes: No results for input(s): "CKTOTAL", "CKMB", "CKMBINDEX", "TROPONINI" in the last 168 hours.  BNP: Invalid input(s): "POCBNP"  CBG: Recent Labs  Lab 05/09/23 1143 05/09/23 1726 05/09/23 2004 05/10/23 0812 05/10/23 1119  GLUCAP 182* 127* 116* 147* 169*    Microbiology: Results for orders placed or performed during the hospital encounter of 05/07/23  Resp panel by RT-PCR (RSV, Flu A&B, Covid) Anterior Nasal Swab     Status: None   Collection Time: 05/07/23 10:26 PM   Specimen: Anterior Nasal Swab  Result Value Ref Range Status   SARS Coronavirus 2 by RT PCR NEGATIVE NEGATIVE Final    Comment: (NOTE) SARS-CoV-2 target nucleic acids are NOT DETECTED.  The SARS-CoV-2 RNA is generally detectable in upper respiratory specimens during the acute phase of infection. The lowest concentration of SARS-CoV-2 viral copies this assay can detect is 138 copies/mL. A  negative result does not preclude SARS-Cov-2 infection and should not be used as the sole basis for treatment or other patient management decisions. A negative result may occur with  improper specimen collection/handling, submission of specimen other than nasopharyngeal swab, presence of viral mutation(s) within the areas targeted by this assay, and inadequate number of viral copies(<138 copies/mL). A negative result must be combined with clinical observations, patient history, and epidemiological information. The expected result is Negative.  Fact Sheet  for Patients:  BloggerCourse.com  Fact Sheet for Healthcare Providers:  SeriousBroker.it  This test is no t yet approved or cleared by the [place] FDA and  has been authorized for detection and/or diagnosis of SARS-CoV-2 by FDA under an Emergency Use Authorization (EUA). This EUA will remain  in effect (meaning this test can be used) for the duration of the COVID-19 declaration under Section 564(b)(1) of the Act, 21 U.S.C.section 360bbb-3(b)(1), unless the authorization is terminated  or revoked sooner.       Influenza A by PCR NEGATIVE NEGATIVE Final   Influenza B by PCR NEGATIVE NEGATIVE Final    Comment: (NOTE) The Xpert Xpress SARS-CoV-2/FLU/RSV plus assay is intended as an aid in the diagnosis of influenza from Nasopharyngeal swab specimens and should not be used as a sole basis for treatment. Nasal washings and aspirates are unacceptable for Xpert Xpress SARS-CoV-2/FLU/RSV testing.  Fact Sheet for Patients: BloggerCourse.com  Fact Sheet for Healthcare Providers: SeriousBroker.it  This test is not yet approved or cleared by the [place] FDA and has been authorized for detection and/or diagnosis of SARS-CoV-2 by FDA under an Emergency Use Authorization (EUA). This EUA will remain in effect (meaning this test can be used) for the duration of the COVID-19 declaration under Section 564(b)(1) of the Act, 21 U.S.C. section 360bbb-3(b)(1), unless the authorization is terminated or revoked.     Resp Syncytial Virus by PCR NEGATIVE NEGATIVE Final    Comment: (NOTE) Fact Sheet for Patients: BloggerCourse.com  Fact Sheet for Healthcare Providers: SeriousBroker.it  This test is not yet approved or cleared by the [place] FDA and has been authorized for detection and/or diagnosis of SARS-CoV-2 by FDA under an  Emergency Use Authorization (EUA). This EUA will remain in effect (meaning this test can be used) for the duration of the COVID-19 declaration under Section 564(b)(1) of the Act, 21 U.S.C. section 360bbb-3(b)(1), unless the authorization is terminated or revoked.  Performed at [facility], [address] [place], [place] [address]   Aerobic/Anaerobic Culture w Gram Stain (surgical/deep wound)     Status: None (Preliminary result)   Collection Time: 05/09/23 11:30 AM   Specimen: Wound  Result Value Ref Range Status   Specimen Description   Final    WOUND Performed at [facility], [address], [place], [place] [address]    Special Requests   Final    LEFT HIP Performed at [facility], [address] [place], [place] [address]    Gram Stain   Final    RARE WBC PRESENT, PREDOMINANTLY PMN MODERATE GRAM NEGATIVE RODS MODERATE GRAM POSITIVE COCCI IN PAIRS    Culture   Final    CULTURE REINCUBATED FOR BETTER GROWTH Performed at [facility], [address] [place], [place] [address]    Report Status PENDING  Incomplete    Coagulation Studies: No results for input(s): "LABPROT", "INR" in the last 72 hours.  Urinalysis: Recent Labs    05/07/23 2308  COLORURINE YELLOW*  LABSPEC  1.008  PHURINE 7.0  GLUCOSEU >=500*  HGBUR NEGATIVE  BILIRUBINUR NEGATIVE  KETONESUR NEGATIVE  PROTEINUR >=300*  NITRITE NEGATIVE  LEUKOCYTESUR TRACE*      Imaging: ECHOCARDIOGRAM COMPLETE  Result Date: 05/10/2023    ECHOCARDIOGRAM REPORT   Patient Name:   Alicia Rogers Date of Exam: 05/10/2023 Medical Rec #:  [id number]          Height:       62.0 in Accession #:    [id number]         Weight:       119.0 lb Date of Birth:  Jun 25, 1961           BSA:          1.533 m Patient Age:    62 years           BP:           183/66 mmHg Patient Gender: F                  HR:           78 bpm. Exam Location:  [facility] Procedure: 2D Echo, Cardiac Doppler and Color  Doppler Indications:     CHF-acute diastolic I50.31  History:         Patient has no prior history of Echocardiogram examinations.                  Risk Factors:Diabetes and Hypertension. Stage 3                  CKD.  Sonographer:     Cristela Blue Referring Phys:  [id number] Andris Baumann Diagnosing Phys: Yvonne Kendall MD IMPRESSIONS  1. Left ventricular ejection fraction, by estimation, is 60 to 65%. The left ventricle has normal function. The left ventricle has no regional wall motion abnormalities. Left ventricular diastolic parameters are indeterminate.  2. Right ventricular systolic function is normal. The right ventricular size is normal. There is mildly elevated pulmonary artery systolic pressure.  3. The mitral valve is normal in structure. Trivial mitral valve regurgitation. No evidence of mitral stenosis.  4. The aortic valve has an indeterminant number of cusps. Aortic valve regurgitation is not visualized. Mild aortic valve stenosis.  5. The inferior vena cava is normal in size with greater than 50% respiratory variability, suggesting right atrial pressure of 3 mmHg. FINDINGS  Left Ventricle: Left ventricular ejection fraction, by estimation, is 60 to 65%. The left ventricle has normal function. The left ventricle has no regional wall motion abnormalities. The left ventricular internal cavity size was normal in size. There is  borderline left ventricular hypertrophy. Left ventricular diastolic parameters are indeterminate. Right Ventricle: The right ventricular size is normal. No increase in right ventricular wall thickness. Right ventricular systolic function is normal. There is mildly elevated pulmonary artery systolic pressure. The tricuspid regurgitant velocity is 3.13  m/s, and with an assumed right atrial pressure of 3 mmHg, the estimated right ventricular systolic pressure is 42.2 mmHg. Left Atrium: Left atrial size was normal in size. Right Atrium: Right atrial size was normal in size.  Pericardium: There is no evidence of pericardial effusion. Mitral Valve: The mitral valve is normal in structure. Trivial mitral valve regurgitation. No evidence of mitral valve stenosis. Tricuspid Valve: The tricuspid valve is not well visualized. Tricuspid valve regurgitation is mild. Aortic Valve: The aortic valve has an indeterminant number of cusps. Aortic valve regurgitation is not visualized. Mild aortic stenosis is present. Aortic  valve mean gradient measures 10.7 mmHg. Aortic valve peak gradient measures 19.1 mmHg. Aortic valve  area, by VTI measures 1.70 cm. Pulmonic Valve: The pulmonic valve was not well visualized. Pulmonic valve regurgitation is not visualized. No evidence of pulmonic stenosis. Aorta: The aortic root is normal in size and structure. Pulmonary Artery: The pulmonary artery is of normal size. Venous: The inferior vena cava is normal in size with greater than 50% respiratory variability, suggesting right atrial pressure of 3 mmHg. IAS/Shunts: The interatrial septum was not well visualized.  LEFT VENTRICLE PLAX 2D LVIDd:         4.30 cm   Diastology LVIDs:         2.90 cm   LV e' medial:    8.70 cm/s LV PW:         1.10 cm   LV E/e' medial:  12.8 LV IVS:        0.90 cm   LV e' lateral:   8.38 cm/s LVOT diam:     2.00 cm   LV E/e' lateral: 13.2 LV SV:         74 LV SV Index:   48 LVOT Area:     3.14 cm  RIGHT VENTRICLE RV Basal diam:  3.10 cm RV Mid diam:    2.40 cm RV S prime:     18.50 cm/s TAPSE (M-mode): 2.3 cm LEFT ATRIUM           Index        RIGHT ATRIUM          Index LA diam:      3.60 cm 2.35 cm/m   RA Area:     8.91 cm LA Vol (A4C): 38.4 ml 25.07 ml/m  RA Volume:   18.30 ml 11.93 ml/m  AORTIC VALVE AV Area (Vmax):    1.58 cm AV Area (Vmean):   1.59 cm AV Area (VTI):     1.70 cm AV Vmax:           218.67 cm/s AV Vmean:          151.333 cm/s AV VTI:            0.435 m AV Peak Grad:      19.1 mmHg AV Mean Grad:      10.7 mmHg LVOT Vmax:         110.00 cm/s LVOT Vmean:         76.800 cm/s LVOT VTI:          0.235 m LVOT/AV VTI ratio: 0.54  AORTA Ao Root diam: 2.20 cm MITRAL VALVE                TRICUSPID VALVE MV Area (PHT): 3.34 cm     TR Peak grad:   39.2 mmHg MV Decel Time: 227 msec     TR Vmax:        313.00 cm/s MV E velocity: 111.00 cm/s MV A velocity: 140.00 cm/s  SHUNTS MV E/A ratio:  0.79         Systemic VTI:  0.24 m                             Systemic Diam: 2.00 cm Yvonne Kendall MD Electronically signed by Yvonne Kendall MD Signature Date/Time: 05/10/2023/12:19:51 PM    Final    MR HIP LEFT W WO CONTRAST  Result Date: 05/09/2023 CLINICAL DATA:  Fall 4 months ago.  Worsening left hip pain EXAM: MRI OF THE LEFT HIP WITHOUT AND WITH CONTRAST TECHNIQUE: Multiplanar, multisequence MR imaging was performed both before and after administration of intravenous contrast. CONTRAST:  5mL GADAVIST GADOBUTROL 1 MMOL/ML IV SOLN COMPARISON:  Radiographs 03/17/2023 FINDINGS: Bones: No osteomyelitis observed. No significant abnormal marrow edema. Articular cartilage and labrum Articular cartilage:  Unremarkable Labrum:  Unremarkable Joint or bursal effusion Joint effusion:  Small left hip joint effusion. Bursae: No regional bursitis Muscles and tendons Muscles and tendons: Indistinct intramuscular edema in the contralateral (right) gluteus maximus and upper vastus musculature. Other findings Miscellaneous: A suspected wound along the lateral margin of the left gluteus maximus muscle and hip extends somewhat deep to the iliotibial band towards the greater trochanter, but without abnormal intraosseous signal. Correlate with visual inspection overlying the left hip in assessing this wound. The wound is best shown on images 19 through 27 of series 15. IMPRESSION: 1. Wound along the lateral margin of the left hip, extending deep to the iliotibial band towards the greater trochanter, but without abnormal intraosseous signal to suggest osteomyelitis. Correlate with visual inspection overlying  the left hip in assessing this wound. 2. Small left hip joint effusion. 3. Indistinct intramuscular edema in the contralateral (right) gluteus maximus and upper vastus musculature. Electronically Signed   By: Gaylyn Rong M.D.   On: 05/09/2023 18:06     Medications:    piperacillin-tazobactam (ZOSYN)  IV 3.375 g (05/09/23 2100)   [START ON 05/11/2023] vancomycin      [START ON 05/11/2023] amLODipine  10 mg Oral Daily   aspirin EC  81 mg Oral Daily   buPROPion ER  100 mg Oral Daily   calcitRIOL  0.25 mcg Oral Daily   diltiazem  360 mg Oral Daily   escitalopram  20 mg Oral Daily   feeding supplement  237 mL Oral BID BM   fluticasone furoate-vilanterol  1 puff Inhalation Daily   And   umeclidinium bromide  1 puff Inhalation Daily   furosemide  40 mg Oral Daily   heparin  5,000 Units Subcutaneous Q8H   hydrALAZINE  50 mg Oral TID   insulin aspart  0-5 Units Subcutaneous QHS   insulin aspart  0-9 Units Subcutaneous TID WC   losartan  100 mg Oral Daily   melatonin  5 mg Oral QHS   nicotine  21 mg Transdermal Daily   potassium chloride  40 mEq Oral Daily   sodium hypochlorite   Irrigation BID   tamsulosin  0.4 mg Oral Daily   acetaminophen **OR** acetaminophen, albuterol, guaiFENesin-dextromethorphan, labetalol, ondansetron **OR** ondansetron (ZOFRAN) IV, traMADol  Assessment/ Plan:  Alicia Rogers is a 62 y.o.  female with schizophrenia, diabetes mellitus type II, hypertension, tobacco use, hyperlipidemia who is admitted to [facility] on 05/07/2023 for Anasarca [R60.1] Peripheral edema [R60.0] [name] (acute kidney injury) (HCC) [N17.9] Acute hypoxemic respiratory failure (HCC) [J96.01] Acute on chronic congestive heart failure, unspecified heart failure type (HCC) [I50.9]  Chronic Kidney Disease stage V with nephrotic range proteinuria: baseline creatinine 3.42, GFR of 14 on 8/5. Chronic kidney disease secondary to diabetic nephropathy.  Renal ultrasound negative for  hydronephrosis.  - Decreased muscle mass may be affecting creatinine.  - Will transition to oral furosemide 40mg  daily today - Continue salt and fluid restriction - Continue holding dapagliflozin, hydrochlorothiazide and sodium bicarbonate.    Hypertension: with suspected acute exacerbation of congestive heart failure. Volume driven - Pending echocardiogram.  - Current regimen of tamsulosin, hydralazine, and diltiazem.  -  Holding hydrochlorothiazide.   Diabetes mellitus type II with chronic kidney disease: noninsulin dependent.  - Holding dapagliflozin - Primary team to manage SSi  Secondary Hyperparathyroidism:  - continue calcitriol - Will continue to monitor bone minerals during this admission   LOS: 3 Alicia Rogers 10/8/202412:37 PM

## 2023-05-10 NOTE — Progress Notes (Signed)
*  PRELIMINARY RESULTS* Echocardiogram 2D Echocardiogram has been performed.  Alicia Rogers 05/10/2023, 7:54 AM

## 2023-05-10 NOTE — Progress Notes (Signed)
Occupational Therapy Treatment Patient Details Name: Alicia Rogers MRN: [id number] DOB: April 29, 1961 Today's Date: 05/10/2023   History of present illness Pt is a 62 y.o. 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   OT comments  Pt seen for OT tx this date.  L arm BP 165/69, seated at [facility].  Able to transfer to [facility] to urinate, with partial UI in standing while transferring to commode.  Assist for thorough clean up at the medial thighs and lower legs and sock change d/t limited reach below knees.  Pt able to participate in cleaning groin while standing.  1 small posterior LOB while performing peri care in standing, but recovered with min guard and RW.  Additional review of PLOF d/t time constraints with eval yesterday with this therapist.  Pt confirmed that she does have caregivers assist her to transfer into her tub shower at home.  Pt reported that she does have grab bars, but does not use a shower chair.  Pt states she also has grab bars at her toilet.  Continue to defer equipment needs to next venue of care.  OT assisted pt to recliner following toileting at [facility].  All necessary items placed within reach.  Will continue to follow to work towards goals in OT poc.       If plan is discharge home, recommend the following:  A little help with walking and/or transfers;A lot of help with bathing/dressing/bathroom;Assistance with cooking/housework;Direct supervision/assist for medications management;Assist for transportation;Supervision due to cognitive  status;Help with stairs or ramp for entrance   Equipment Recommendations  Other (comment) (defer to next venue of care)    Recommendations for Other Services      Precautions / Restrictions Precautions Precautions: Fall Precaution Comments: B hip wounds Restrictions Weight Bearing Restrictions: No       Mobility Bed Mobility Overal bed mobility: Needs Assistance Bed Mobility: Sit to Supine       Sit to supine: Mod assist   General bed mobility comments: partial assist to move LEs and trunk Patient Response: Flat affect, Cooperative  Transfers Overall transfer level: Needs assistance Equipment used: Rolling walker (2 wheels) Transfers: Sit to/from Stand, Bed to chair/wheelchair/BSC Sit to Stand: Contact guard assist     Step pivot transfers: Contact guard assist     General transfer comment: increased time for processing commands and initiating movement     Balance Overall balance assessment: Needs assistance Sitting-balance support: No upper extremity supported, Feet supported Sitting balance-Leahy Scale: Fair Sitting balance - Comments: pt reaches within BOS without LOB   Standing balance support: Single extremity supported, Reliant on assistive device for balance, During functional activity Standing balance-Leahy Scale: Fair Standing balance comment: 1 small LOB while completing peri care in standing; recovered with min guard and RW                           ADL either performed or assessed with clinical judgement   ADL Overall ADL's : Needs assistance/impaired                 Upper  Body Dressing : Minimal assistance;Sitting Upper Body Dressing Details (indicate cue type and reason): gown change; assist to manage tie behind neck but pt able to thread both arms through sleeves and pull up to shoulders Lower Body Dressing: Maximal assistance;Sitting/lateral leans Lower Body Dressing Details (indicate cue type and reason): inability to reach  below knees in sitting; assist with sock change after UI in standing Toilet Transfer: Contact guard assist;BSC/3in1;Rolling walker (2 wheels)   Toileting- Clothing Manipulation and Hygiene: Moderate assistance Toileting - Clothing Manipulation Details (indicate cue type and reason): Able to perform peri care in standing; OT assist for thorough cleaning at the medial thighs d/t some incontinence during transfer to commode.     Functional mobility during ADLs: Contact guard assist;Rolling walker (2 wheels) General ADL Comments: ambulatory at bedside with increased time using RW and min guard    Extremity/Trunk Assessment Upper Extremity Assessment Upper Extremity Assessment: Generalized weakness LUE Deficits / Details: L hand flexion contracture; limited to holding walker with L index and thumb only (R hand able to fully ext and flex digits 1-5)   Lower Extremity Assessment Lower Extremity Assessment: Generalized weakness        Vision Patient Visual Report: No change from baseline     Perception     Praxis Praxis Praxis: Impaired Praxis Impairment Details: Motor planning;Initiation    Cognition Arousal: Alert Behavior During Therapy: Flat affect Overall Cognitive Status: No family/caregiver present to determine baseline cognitive functioning                                 General Comments: increased time to process and respond to questions                     General Comments bilat hip ulcers    Pertinent Vitals/ Pain       Pain Assessment Pain Assessment: 0-10 Pain Score: 9  Pain Location: bilat hips Pain Descriptors / Indicators: Guarding, Discomfort Pain Intervention(s): Limited activity within patient's tolerance, Monitored during session, Repositioned  Home Living                                          Prior Functioning/Environment              Frequency  Min 1X/week        Progress Toward Goals  OT  Goals(current goals can now be found in the care plan section)  Progress towards OT goals: Progressing toward goals  Acute Rehab OT Goals Patient Stated Goal: To go home OT Goal Formulation: With patient Time For Goal Achievement: 05/23/23 Potential to Achieve Goals: Fair  Plan      Co-evaluation                 AM-PAC OT "6 Clicks" Daily Activity     Outcome Measure   Help from another person eating meals?: A Little Help from another person taking care of personal grooming?: A Lot Help from another person toileting, which includes using toliet, bedpan, or urinal?: A Lot Help from another person bathing (including washing, rinsing, drying)?: A Lot Help from another person to put on and taking off regular upper body clothing?: A Lot Help from another person to put on and taking off regular lower body clothing?: Total 6 Click Score: 12  End of Session Equipment Utilized During Treatment: Rolling walker (2 wheels)  OT Visit Diagnosis: Unsteadiness on feet (R26.81);Muscle weakness (generalized) (M62.81);Pain Pain - Right/Left: Left (bilat hips) Pain - part of body: Hip   Activity Tolerance Patient tolerated treatment well   Patient Left in chair;with call bell/phone within reach;with chair alarm set   Nurse Communication Mobility status (notified RN of pt's BP at 165/69 sitting L arm)        Time: 3086-5784 OT Time Calculation (min): 30 min  Charges: OT General Charges $OT Visit: 1 Visit OT Treatments $Self Care/Home Management : 23-37 mins  Danelle Earthly, MS, OTR/L   Otis Dials 05/10/2023, 2:04 PM

## 2023-05-10 NOTE — TOC Initial Note (Signed)
Transition of Care [facility]) - Initial/Assessment Note    Patient Details  Name: Alicia Rogers MRN: [id number] Date of Birth: [birth year]  Transition of Care [facility] CM/SW Contact:    Allena Katz, LCSW Phone Number: 05/10/2023, 10:06 AM  Clinical Narrative:     Pt admitted from above and beyond group home and has been there for over a year. Pt active with Dr. Standley Brooking at [place] [facility]. Pt reported no SDOH needs and reports group home assists with appointments as needed. Pt has a L hip wound that was debrided on 10/7.  Surgery to reassess this morning if further debridement is needed. PT has recommended HH. CSW will speak to patient on if she would like HH at discharge.                   Patient Goals and CMS Choice            Expected Discharge Plan and Services                                              Prior Living Arrangements/Services                       Activities of Daily Living   ADL Screening (condition at time of admission) Independently performs ADLs?: No Does the patient have a NEW difficulty with bathing/dressing/toileting/self-feeding that is expected to last >3 days?: Yes (Initiates electronic notice to provider for possible OT consult) Does the patient have a NEW difficulty with getting in/out of bed, walking, or climbing stairs that is expected to last >3 days?: Yes (Initiates electronic notice to provider for possible PT consult) Does the patient have a NEW difficulty with communication that is expected to last >3 days?: No Is the patient deaf or have difficulty hearing?: No Does the patient have difficulty seeing, even when wearing glasses/contacts?: No Does the patient have difficulty concentrating, remembering, or making decisions?: No  Permission Sought/Granted                  Emotional Assessment              Admission diagnosis:  Anasarca [R60.1] Peripheral edema [R60.0] [name] (acute kidney injury) (HCC)  [N17.9] Acute hypoxemic respiratory failure (HCC) [J96.01] Acute on chronic congestive heart failure, unspecified heart failure type (HCC) [I50.9] Patient Active Problem List   Diagnosis Date Noted   Pressure injury of hip, unstageable (HCC) 05/09/2023   Acute urinary retention 05/08/2023   Bilateral lower extremity edema 05/08/2023   [name] (acute kidney injury) (HCC) 05/08/2023   Anasarca 05/07/2023   Hypoxia 05/07/2023   CKD (chronic kidney disease) stage 5, GFR less than 15 ml/min (HCC) 05/07/2023   Leukocytosis 05/07/2023   Bilateral pleural effusion 05/07/2023   Thyroid nodule 03/09/2023   Postoperative examination 11/03/2022   Acute-on-chronic kidney injury (HCC) 09/30/2022   Postoperative ileus (HCC) 09/30/2022   Postoperative intra-abdominal abscess (HCC) 09/30/2022   Chronic obstructive pulmonary disease (HCC) 09/03/2022   Vitamin D deficiency, unspecified 09/03/2022   Allergic rhinitis 09/03/2022   Tobacco abuse counseling 09/03/2022   Pneumonia due to respiratory syncytial virus (RSV) 08/30/2022   Pulmonary nodule 08/30/2022   Iron deficiency anemia 07/21/2022   Gastric erosion 07/21/2022   Polyp of duodenum 07/21/2022   Adenomatous polyp of descending colon 07/21/2022   Dysuria 06/30/2022  MGUS (monoclonal gammopathy of unknown significance) 02/05/2022   Stage 3b chronic kidney disease (HCC) 12/22/2021   Paranoid schizophrenia (HCC) 11/25/2021   Proteinuria, unspecified 11/25/2021   Symptomatic anemia 11/25/2021   Schizoaffective disorder, bipolar type (HCC) 09/29/2021   Hypertensive urgency 09/28/2021   Sinus bradycardia 09/27/2021   Anemia in chronic kidney disease (CKD) 09/27/2021   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   Uterine mass 03/03/2021   Essential hypertension 02/28/2020   Back pain 02/28/2020   Hematuria 02/28/2020   Irritant contact dermatitis due to other agents 03/08/2018   Increased frequency of urination 09/21/2017   Chronic kidney disease, stage 4 (severe)  (HCC) 08/26/2017   Low back pain 05/26/2017   Extrapyramidal symptom 03/12/2016   Tobacco use disorder 10/11/2014   Mixed Hyperlipidemia 07/04/2014   Claudication (HCC) 06/09/2014   Schizoaffective disorder, depressive type (HCC) 06/09/2014   Diabetes mellitus, type 2 (HCC) 06/09/2014   Type 2 diabetes mellitus with diabetic chronic kidney disease (HCC) 06/09/2014   PCP:  Patient, No Pcp Per Pharmacy:   [facility] - [place], [place] - [address] [place] [address] Phone: [phone] Fax: [phone]     Social Determinants of Health (SDOH) Social History: SDOH Screenings   Food Insecurity: No Food Insecurity (03/04/2023)   Received from [facility]  Housing: Patient Declined (08/30/2022)  Transportation Needs: No Transportation Needs (03/04/2023)   Received from [facility]  Utilities: Not At Risk (03/04/2023)   Received from [facility]  Alcohol Screen: Low Risk  (09/29/2021)  Financial Resource Strain: Medium Risk (03/04/2023)   Received from [facility]  Social Connections: Unknown (09/28/2022)   Received from [facility]  Tobacco Use: High Risk (05/07/2023)   SDOH Interventions:     Readmission Risk Interventions    05/10/2023   10:05 AM  Readmission Risk Prevention Plan  Transportation Screening Complete  HRI or Home Care Consult Complete  Palliative Care Screening Complete  Medication Review (RN Care Manager) Complete

## 2023-05-10 NOTE — Progress Notes (Signed)
PROGRESS NOTE    Alicia Rogers  NWG:[id number] DOB: 1960/11/18 DOA: 05/07/2023 PCP: Patient, No Pcp Per    Brief Narrative:   62 y.o. 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  Patient who ambulates with a walker at  baseline. has had no orthopnea or shortness of breath no complaints of chest pain.  Patient was last seen by her nephrologist on 04/07/23 when she was noted to have lower extremity edema and her torsemide was replaced with furosemide at that time. Denies cough, fever or chills or leg pain. Endorses dysuria/pain with urination.  Of note, during hospitalization in January 2024, patient was noted to have a pulmonary nodule which showed complete CT resolution by the time of pulmonology follow-up 03/2023.  Pulmonologist did express some concern for aspiration for which barium swallow was ordered, (not performed) for ongoing cough however patient currently denies  cough   Seen in consultation by nephrology  WOC involved for evaluation of bilateral hip wounds.  Noted to have unstageable left hip/trochanter wound with foul-smelling tan-colored drainage.  Recommend surgical evaluation.  Surgical consult requested.   Assessment & Plan:   Principal Problem:   Anasarca Active Problems:   Hypoxia   Leukocytosis   Acute urinary retention   Iron deficiency anemia   CKD (chronic kidney disease) stage 5, GFR less than 15 ml/min (HCC)   Tobacco use disorder   Diabetes mellitus, type 2 (HCC)   Essential hypertension   Schizoaffective disorder,  bipolar type (HCC)   MGUS (monoclonal gammopathy of unknown significance)   Chronic obstructive pulmonary disease (HCC)   Bilateral pleural effusion   Bilateral lower extremity edema   [name] (acute kidney injury) (HCC)   Pressure injury of hip, unstageable (HCC)  * Anasarca Stage V CKD with nephrotic range proteinuria Small bilateral pleural effusions Suspect anasarca is renal and not cardiac BNP elevated over 400  Urine output poor No hydronephrosis noted on ultrasound Plan: No indication for dialysis acutely Continue IV Lasix 60 mg twice daily Salt and fluid restrict Hold nephrotoxins 2D echocardiogram, completed, pending read   Acute urinary retention Following admission, patient could not produce a urine specimen and In-N-Out cath yielded 800 mL.  Significant for glucosuria and significant proteinuria Plan: Flomax 0.4 mg daily ordered   Leukocytosis Possible respiratory tract infection versus urinary tract infection Hypoxia/acute respiratory failure with hypoxia Full-thickness left hip wound with drainage WBC 14,000, with low-grade temp of 99.1 on arrival, with mild hypoxia to 88% in the ED requiring O2 at 2 L.  WOC noted left hip wound with foul-smelling drainage Chest x-ray showing hazy airspace disease at bases Urinalysis bland MRI hip no evidence of osteo Plan: Wound culture, pending Continue broad-spectrum antibiotics Appreciate surgical follow-up.  No operating room debridement indicated at this time  Iron deficiency anemia Anemia of CKD stage V Hemoglobin 8.4, down from 9.1 a month prior Continue to monitor and transfuse if necessary Currently no indication for transfusion Monitor and transfuse as necessary.   Goal hemoglobin greater than 7   Chronic obstructive pulmonary disease (HCC) Not acutely exacerbated Continue home inhalers DuoNebs as needed  Schizoaffective disorder, bipolar type (HCC) Continue home meds   Essential hypertension Suboptimal  control Continue losartan, hydralazine, diltiazem Increase amlodipine 10 mg daily Lasix as above   Diabetes mellitus, type 2 (HCC) Sliding scale insulin coverage   Tobacco use disorder Nicotine patch   DVT prophylaxis: SQ heparin Code Status: Full Family Communication: None Disposition Plan: Status is: Inpatient Remains inpatient appropriate because: Multiple acute issues as above   Level of care: Telemetry Medical  Consultants:  Nephrology  Procedures:  None  Antimicrobials: None   Subjective: Seen and examined.  Flattened affect.  Appears fatigued and chronically ill.  Objective: Vitals:   05/10/23 0403 05/10/23 0433 05/10/23 0811 05/10/23 0900  BP: (!) 176/68  (!) 191/73 (!) 165/79  Pulse: 78  87   Resp: 17  18   Temp: 98.2 F (36.8 C)  98.4 F (36.9 C)   TempSrc: Oral     SpO2: 96%  99%   Weight:  54 kg    Height:        Intake/Output Summary (Last 24 hours) at 05/10/2023 1118 Last data filed at 05/09/2023 2103 Gross per 24 hour  Intake --  Output 800 ml  Net -800 ml   Filed Weights   05/07/23 1618 05/09/23 0500 05/10/23 0433  Weight: 49 kg 53.4 kg 54 kg    Examination:  General exam: Chronically ill-appearing.  Appears fatigued Respiratory system: Bibasilar crackles.  Normal work of breathing.  Room air Cardiovascular system: S S1-S2, RRR, no murmurs, 2+ pitting edema BLE Gastrointestinal system: Thin, soft, NT/ND, normal bowel sounds Central nervous system: Alert.  Oriented x 2.  No focal deficits Extremities: Symmetric 5 x 5 power. Skin: Left hip deep ulceration with tan-colored drainage Psychiatry: Judgement and insight appear impaired. Mood & affect flattened.     Data Reviewed: I have personally reviewed following labs and imaging studies  CBC: Recent Labs  Lab 05/07/23 1619 05/08/23 0412 05/09/23 0819 05/10/23 0838  WBC 14.6* 13.3* 15.7* 13.3*  NEUTROABS 10.6*  --  12.0* 10.7*  HGB 8.4* 8.3* 9.1* 9.5*  HCT 27.3* 27.9*  29.5* 30.3*  MCV 93.2 93.3 91.9 90.7  PLT 458* 480* 541* 578*   Basic Metabolic Panel: Recent Labs  Lab 05/07/23 1619 05/08/23 0412 05/09/23 0819 05/10/23 0838  NA 138 140 140 138  K 3.2* 3.6 4.9 4.6  CL 99 102 102 100  CO2 27 27 27 24   GLUCOSE 158* 249* 159* 176*  BUN 33* 31* 30* 34*  CREATININE 2.85* 2.65* 2.70* 2.80*  CALCIUM 8.5* 8.5* 9.2 9.1  MG  --   --  2.0  --    GFR: Estimated Creatinine Clearance: 16.3 mL/min (A) (by C-G formula based on SCr of 2.8 mg/dL (H)). Liver Function Tests: Recent Labs  Lab 05/07/23 1619  AST 22  ALT 22  ALKPHOS 105  BILITOT 0.5  PROT 7.1  ALBUMIN 2.0*   No results for input(s): "LIPASE", "AMYLASE" in the last 168 hours. No results for input(s): "AMMONIA" in the last 168 hours. Coagulation Profile: No results for input(s): "INR", "PROTIME" in the last 168 hours. Cardiac Enzymes: No results for input(s): "CKTOTAL", "CKMB", "CKMBINDEX", "TROPONINI" in the last 168 hours. BNP (last 3 results) No results for input(s): "PROBNP" in the last 8760 hours. HbA1C: Recent Labs    05/08/23 0412  HGBA1C 7.6*   CBG: Recent Labs  Lab 05/09/23 0805 05/09/23 1143 05/09/23 1726 05/09/23 2004 05/10/23 0812  GLUCAP 157* 182* 127* 116* 147*   Lipid  Profile: No results for input(s): "CHOL", "HDL", "LDLCALC", "TRIG", "CHOLHDL", "LDLDIRECT" in the last 72 hours. Thyroid Function Tests: No results for input(s): "TSH", "T4TOTAL", "FREET4", "T3FREE", "THYROIDAB" in the last 72 hours. Anemia Panel: No results for input(s): "VITAMINB12", "FOLATE", "FERRITIN", "TIBC", "IRON", "RETICCTPCT" in the last 72 hours. Sepsis Labs: Recent Labs  Lab 05/07/23 1750 05/08/23 0412  PROCALCITON  --  0.21  LATICACIDVEN 1.1  --     Recent Results (from the past 240 hour(s))  Resp panel by RT-PCR (RSV, Flu A&B, Covid) Anterior Nasal Swab     Status: None   Collection Time: 05/07/23 10:26 PM   Specimen: Anterior Nasal Swab  Result Value Ref Range Status    SARS Coronavirus 2 by RT PCR NEGATIVE NEGATIVE Final    Comment: (NOTE) SARS-CoV-2 target nucleic acids are NOT DETECTED.  The SARS-CoV-2 RNA is generally detectable in upper respiratory specimens during the acute phase of infection. The lowest concentration of SARS-CoV-2 viral copies this assay can detect is 138 copies/mL. A negative result does not preclude SARS-Cov-2 infection and should not be used as the sole basis for treatment or other patient management decisions. A negative result may occur with  improper specimen collection/handling, submission of specimen other than nasopharyngeal swab, presence of viral mutation(s) within the areas targeted by this assay, and inadequate number of viral copies(<138 copies/mL). A negative result must be combined with clinical observations, patient history, and epidemiological information. The expected result is Negative.  Fact Sheet for Patients:  BloggerCourse.com  Fact Sheet for Healthcare Providers:  SeriousBroker.it  This test is no t yet approved or cleared by the [place] FDA and  has been authorized for detection and/or diagnosis of SARS-CoV-2 by FDA under an Emergency Use Authorization (EUA). This EUA will remain  in effect (meaning this test can be used) for the duration of the COVID-19 declaration under Section 564(b)(1) of the Act, 21 U.S.C.section 360bbb-3(b)(1), unless the authorization is terminated  or revoked sooner.       Influenza A by PCR NEGATIVE NEGATIVE Final   Influenza B by PCR NEGATIVE NEGATIVE Final    Comment: (NOTE) The Xpert Xpress SARS-CoV-2/FLU/RSV plus assay is intended as an aid in the diagnosis of influenza from Nasopharyngeal swab specimens and should not be used as a sole basis for treatment. Nasal washings and aspirates are unacceptable for Xpert Xpress SARS-CoV-2/FLU/RSV testing.  Fact Sheet for  Patients: BloggerCourse.com  Fact Sheet for Healthcare Providers: SeriousBroker.it  This test is not yet approved or cleared by the [place] FDA and has been authorized for detection and/or diagnosis of SARS-CoV-2 by FDA under an Emergency Use Authorization (EUA). This EUA will remain in effect (meaning this test can be used) for the duration of the COVID-19 declaration under Section 564(b)(1) of the Act, 21 U.S.C. section 360bbb-3(b)(1), unless the authorization is terminated or revoked.     Resp Syncytial Virus by PCR NEGATIVE NEGATIVE Final    Comment: (NOTE) Fact Sheet for Patients: BloggerCourse.com  Fact Sheet for Healthcare Providers: SeriousBroker.it  This test is not yet approved or cleared by the [place] FDA and has been authorized for detection and/or diagnosis of SARS-CoV-2 by FDA under an Emergency Use Authorization (EUA). This EUA will remain in effect (meaning this test can be used) for the duration of the COVID-19 declaration under Section 564(b)(1) of the Act, 21 U.S.C. section 360bbb-3(b)(1), unless the authorization is terminated or revoked.  Performed at [facility], [address] [place], [place] [address]  Aerobic/Anaerobic Culture w Gram Stain (surgical/deep wound)     Status: None (Preliminary result)   Collection Time: 05/09/23 11:30 AM   Specimen: Wound  Result Value Ref Range Status   Specimen Description   Final    WOUND Performed at [facility], [address], [place], [place] [address]    Special Requests   Final    LEFT HIP Performed at [facility], [address] [place], [place] [address]    Gram Stain   Final    RARE WBC PRESENT, PREDOMINANTLY PMN MODERATE GRAM NEGATIVE RODS MODERATE GRAM POSITIVE COCCI IN PAIRS    Culture   Final    CULTURE REINCUBATED FOR BETTER GROWTH Performed at  [facility], [address], [place], [place] [address]    Report Status PENDING  Incomplete         Radiology Studies: MR HIP LEFT W WO CONTRAST  Result Date: 05/09/2023 CLINICAL DATA:  Fall 4 months ago.  Worsening left hip pain EXAM: MRI OF THE LEFT HIP WITHOUT AND WITH CONTRAST TECHNIQUE: Multiplanar, multisequence MR imaging was performed both before and after administration of intravenous contrast. CONTRAST:  5mL GADAVIST GADOBUTROL 1 MMOL/ML IV SOLN COMPARISON:  Radiographs 03/17/2023 FINDINGS: Bones: No osteomyelitis observed. No significant abnormal marrow edema. Articular cartilage and labrum Articular cartilage:  Unremarkable Labrum:  Unremarkable Joint or bursal effusion Joint effusion:  Small left hip joint effusion. Bursae: No regional bursitis Muscles and tendons Muscles and tendons: Indistinct intramuscular edema in the contralateral (right) gluteus maximus and upper vastus musculature. Other findings Miscellaneous: A suspected wound along the lateral margin of the left gluteus maximus muscle and hip extends somewhat deep to the iliotibial band towards the greater trochanter, but without abnormal intraosseous signal. Correlate with visual inspection overlying the left hip in assessing this wound. The wound is best shown on images 19 through 27 of series 15. IMPRESSION: 1. Wound along the lateral margin of the left hip, extending deep to the iliotibial band towards the greater trochanter, but without abnormal intraosseous signal to suggest osteomyelitis. Correlate with visual inspection overlying the left hip in assessing this wound. 2. Small left hip joint effusion. 3. Indistinct intramuscular edema in the contralateral (right) gluteus maximus and upper vastus musculature. Electronically Signed   By: Gaylyn Rong M.D.   On: 05/09/2023 18:06   US RENAL  Result Date: 05/08/2023 CLINICAL DATA:  Acute renal failure EXAM: RENAL / URINARY TRACT ULTRASOUND COMPLETE COMPARISON:   09/30/2022 CT FINDINGS: Right Kidney: Renal measurements: 9.3 x 4.2 x 4.9 cm = volume: 100 mL. Increased renal echogenicity. No hydronephrosis. Left Kidney: Renal measurements: 9.2 x 5.3 x 4.5 cm = volume: 113 mL. Increased renal echogenicity. No hydronephrosis. Lower pole left renal cyst of 1.3 cm. Bladder: Appears normal for degree of bladder distention. Other: None. IMPRESSION: No hydronephrosis. Increased renal echogenicity, suggesting chronic medical renal disease. Electronically Signed   By: Jeronimo Greaves M.D.   On: 05/08/2023 12:20        Scheduled Meds:  [START ON 05/11/2023] amLODipine  10 mg Oral Daily   aspirin EC  81 mg Oral Daily   buPROPion ER  100 mg Oral Daily   calcitRIOL  0.25 mcg Oral Daily   diltiazem  360 mg Oral Daily   escitalopram  20 mg Oral Daily   feeding supplement  237 mL Oral BID BM   fluticasone furoate-vilanterol  1 puff Inhalation Daily   And   umeclidinium bromide  1  puff Inhalation Daily   furosemide  60 mg Intravenous Q12H   heparin  5,000 Units Subcutaneous Q8H   hydrALAZINE  50 mg Oral TID   insulin aspart  0-5 Units Subcutaneous QHS   insulin aspart  0-9 Units Subcutaneous TID WC   losartan  100 mg Oral Daily   melatonin  5 mg Oral QHS   nicotine  21 mg Transdermal Daily   potassium chloride  40 mEq Oral Daily   sodium hypochlorite   Irrigation BID   tamsulosin  0.4 mg Oral Daily   Continuous Infusions:  piperacillin-tazobactam (ZOSYN)  IV 3.375 g (05/09/23 2100)   [START ON 05/11/2023] vancomycin       LOS: 3 days      Tresa Moore, MD [facility]   If 7PM-7AM, please contact night-coverage  05/10/2023, 11:18 AM

## 2023-05-10 NOTE — Progress Notes (Signed)
[facility] SURGICAL PROGRESS NOTE  Hospital Day(s): 3.   Interval History:  Patient seen and examined No acute events or new complaints overnight.  Patient reports she has soreness at left hip; worse when she moves No fever, chills  Leukocytosis is improving; 13.3K (from 15.7K) Hgb to 9.5; stable Renal function stable; scr - 2.80; UO - 1450 ccs + unmeasured No electrolyte derangements  Wound Cx growing GNR, GPC in pairs Currently on Vancomycin and Zosyn  Vital signs in last 24 hours: [min-max] current  Temp:  [98.2 F (36.8 C)-98.9 F (37.2 C)] 98.4 F (36.9 C) (10/08 0811) Pulse Rate:  [78-87] 87 (10/08 0811) Resp:  [17-18] 18 (10/08 0811) BP: (151-191)/(64-73) 191/73 (10/08 0811) SpO2:  [94 %-100 %] 99 % (10/08 0811) Weight:  [54 kg] 54 kg (10/08 0433)     Height: 5\' 2"  (157.5 cm) Weight: 54 kg BMI (Calculated): 21.77   Intake/Output last 2 shifts:  10/07 0701 - 10/08 0700 In: 0  Out: 1450 [Urine:1450]   Physical Exam:  Constitutional: alert, cooperative and no distress  Respiratory: breathing non-labored at rest  Cardiovascular: regular rate and sinus rhythm  Integumentary: Left hip wound, wound bed appears healthy, no evidence of undrained collection, no evidence of necrosis, dressing changed  Left Hip wound (05/10/2023)   Labs:     Latest Ref Rng & Units 05/10/2023    8:38 AM 05/09/2023    8:19 AM 05/08/2023    4:12 AM  CBC  WBC 4.0 - 10.5 K/uL 13.3  15.7  13.3   Hemoglobin 12.0 - 15.0 g/dL 9.5  9.1  8.3   Hematocrit 36.0 - 46.0 % 30.3  29.5  27.9   Platelets 150 - 400 K/uL 578  541  480       Latest Ref Rng & Units 05/10/2023    8:38 AM 05/09/2023    8:19 AM 05/08/2023    4:12 AM  CMP  Glucose 70 - 99 mg/dL 161  096  045   BUN 8 - 23 mg/dL 34  30  31   Creatinine 0.44 - 1.00 mg/dL 4.09  8.11  9.14   Sodium 135 - 145 mmol/L 138  140  140   Potassium 3.5 - 5.1 mmol/L 4.6  4.9  3.6   Chloride 98 - 111 mmol/L 100  102  102   CO2 22 - 32  mmol/L 24  27  27    Calcium 8.9 - 10.3 mg/dL 9.1  9.2  8.5      Imaging studies: No new pertinent imaging studies   Assessment/Plan:  62 y.o. female with left hip wound s/p bedside debridement on 10/08   - Overall, wound appears much better this morning. I do not think she needs any formal debridement in the OR at this time - Continue local wound care: Pack wound BID and as needed with Dakins moistened gauze, cover, secure. Okay to utilize mediplex dressing - Low air loss mattress; frequent repositioning., pressure offloading - Continue IV Abx; follow up Cx   - Pain control prn   - Further management per primary service    All of the above findings and recommendations were discussed with the patient, and the medical team, and all of patient's questions were answered to her expressed satisfaction.  -- Lynden Oxford, PA-C [facility] 05/10/2023, 9:29 AM M-F: 7am - 4pm

## 2023-05-10 NOTE — Care Management Important Message (Signed)
Important Message  Patient Details  Name: Alicia Rogers MRN: [id number] Date of Birth: June 16, 1961   Important Message Given:  N/A - LOS <3 / Initial given by admissions     Olegario Messier A Parrie Rasco 05/10/2023, 8:08 AM
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
## 2023-05-11 DIAGNOSIS — Z515 Encounter for palliative care: Secondary | ICD-10-CM

## 2023-05-11 DIAGNOSIS — J9601 Acute respiratory failure with hypoxia: Secondary | ICD-10-CM | POA: Diagnosis not present

## 2023-05-11 DIAGNOSIS — F250 Schizoaffective disorder, bipolar type: Secondary | ICD-10-CM

## 2023-05-11 DIAGNOSIS — R601 Generalized edema: Secondary | ICD-10-CM | POA: Diagnosis not present

## 2023-05-11 DIAGNOSIS — I509 Heart failure, unspecified: Secondary | ICD-10-CM | POA: Diagnosis not present

## 2023-05-11 LAB — GLUCOSE, CAPILLARY
Glucose-Capillary: 120 mg/dL — ABNORMAL HIGH (ref 70–99)
Glucose-Capillary: 215 mg/dL — ABNORMAL HIGH (ref 70–99)
Glucose-Capillary: 230 mg/dL — ABNORMAL HIGH (ref 70–99)
Glucose-Capillary: 131 mg/dL — ABNORMAL HIGH (ref 70–99)

## 2023-05-11 MED ORDER — ADULT MULTIVITAMIN W/MINERALS CH
1.0000 | ORAL_TABLET | Freq: Every day | ORAL | Status: DC
  Administered 2023-05-11 – 2023-05-16 (×6): 1 via ORAL
  Filled 2023-05-11 (×6): qty 1

## 2023-05-11 MED ORDER — ZINC SULFATE 220 (50 ZN) MG PO CAPS
220.0000 mg | ORAL_CAPSULE | Freq: Every day | ORAL | Status: DC
  Administered 2023-05-11 – 2023-05-16 (×6): 220 mg via ORAL
  Filled 2023-05-11 (×6): qty 1

## 2023-05-11 MED ORDER — VITAMIN C 500 MG PO TABS
500.0000 mg | ORAL_TABLET | Freq: Two times a day (BID) | ORAL | Status: DC
  Administered 2023-05-11 – 2023-05-16 (×10): 500 mg via ORAL
  Filled 2023-05-11 (×10): qty 1

## 2023-05-11 MED ORDER — CLONIDINE HCL 0.1 MG PO TABS
0.2000 mg | ORAL_TABLET | Freq: Two times a day (BID) | ORAL | Status: DC
  Administered 2023-05-11 – 2023-05-12 (×3): 0.2 mg via ORAL
  Filled 2023-05-11 (×3): qty 2

## 2023-05-11 NOTE — TOC Progression Note (Addendum)
Transition of Care [facility]) - Progression Note    Patient Details  Name: Alicia Rogers MRN: [id number] Date of Birth: [birth year]  Transition [facility]) CM/SW Contact  Allena Katz, LCSW Phone Number: 05/11/2023, 3:49 PM  Clinical Narrative:   CSW spoke with pt at bedside to explain recommendations being changed to SNF. Pt would like referrals sent out in [place] for her.   Referrals sent out for rehab. CSW waiting on level 2 passr.        Expected Discharge Plan and Services                                               Social Determinants of Health (SDOH) Interventions SDOH Screenings   Food Insecurity: No Food Insecurity (03/04/2023)   Received from [facility]  Housing: Patient Declined (08/30/2022)  Transportation Needs: No Transportation Needs (03/04/2023)   Received from [facility]  Utilities: Not At Risk (03/04/2023)   Received from [facility]  Alcohol Screen: Low Risk  (09/29/2021)  Financial Resource Strain: Medium Risk (03/04/2023)   Received from [facility]  Social Connections: Unknown (09/28/2022)   Received from [facility]  Tobacco Use: High Risk (05/07/2023)    Readmission Risk Interventions    05/10/2023   10:05 AM  Readmission Risk Prevention Plan  Transportation Screening Complete  HRI or Home Care Consult Complete  Palliative Care Screening Complete  Medication Review (RN Care Manager) Complete

## 2023-05-11 NOTE — Progress Notes (Signed)
[facility] Progress Note  Patient: Alicia Rogers    WGN:[id number]  DOA: 05/07/2023     Date of Service: the patient was seen and examined on 05/11/2023  Chief Complaint  Patient presents with   Leg Swelling   Brief hospital course: 62 y.o. 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  Patient who ambulates with a walker at  baseline. has had no orthopnea or shortness of breath no complaints of chest pain.  Patient was last seen by her nephrologist on 04/07/23 when she was noted to have lower extremity edema and her torsemide was replaced with furosemide at that time. Denies cough, fever or chills or leg pain. Endorses dysuria/pain with urination.  Of note, during hospitalization in January 2024, patient was noted to have a pulmonary nodule which showed complete CT resolution by the time of pulmonology follow-up 03/2023.  Pulmonologist did express some concern for aspiration for which barium swallow was ordered, (not performed) for ongoing cough however patient currently denies  cough    Seen in consultation by nephrology   WOC involved for evaluation of bilateral hip wounds.  Noted to have unstageable left hip/trochanter wound with foul-smelling tan-colored drainage.  Recommend surgical evaluation.  Surgical consult requested.    Assessment and Plan: # Anasarca, Resolved  Stage V CKD with nephrotic range proteinuria Small bilateral pleural effusions Suspect anasarca is renal and not cardiac BNP elevated over 400  Urine output poor No  hydronephrosis noted on ultrasound Plan: No indication for dialysis acutely S/p IV Lasix 60 mg twice daily, transitioned to Lasix 40 mg p.o. daily On 10/8 Salt and fluid restrict Hold nephrotoxins TTE LVEF 60-65%, no wall motion abnormality.   Acute urinary retention Following admission, patient could not produce a urine specimen and In-N-Out cath yielded 800 mL.  Significant for glucosuria and significant proteinuria Plan: Flomax 0.4 mg daily ordered   Leukocytosis Possible respiratory tract infection versus urinary tract infection Hypoxia/acute respiratory failure with hypoxia Full-thickness left hip wound with drainage WBC 14,000, with low-grade temp of 99.1 on arrival, with mild hypoxia to 88% in the ED requiring O2 at 2 L.  WOC noted left hip wound with foul-smelling drainage Chest x-ray showing hazy airspace disease at bases Urinalysis bland MRI hip no evidence of osteo Plan: Wound culture, pending Continue broad-spectrum antibiotics Appreciate surgical follow-up.  No operating room debridement indicated at this time   Iron deficiency anemia Anemia of CKD stage V 10/9 Hemoglobin 9.5 stable Continue to monitor and transfuse if necessary Currently no indication for transfusion Monitor and transfuse as necessary.   Goal hemoglobin greater than 7   Chronic obstructive pulmonary disease (HCC) Not acutely exacerbated Continue home inhalers DuoNebs as needed   Schizoaffective disorder, bipolar type Continue home meds   Essential hypertension Suboptimal control Continue losartan, hydralazine, diltiazem S/p amlodipine 10 mg daily, d/c'd on 10/9 and started clonidine 0.2 mg p.o. twice daily Lasix 40 mg p.o. daily   Diabetes mellitus, type 2 Sliding scale insulin coverage   Tobacco use disorder Nicotine patch   Body mass index is 21.77 kg/m.  Interventions:  Pressure Injury 05/08/23 Thigh  Anterior;Left;Proximal Unstageable - Full thickness tissue loss in which  the base of the injury is covered by slough (yellow, tan, gray, green or brown) and/or eschar (tan, brown or black) in the wound bed. slough covered w (Active)  05/08/23 1620  Location: Thigh  Location Orientation: Anterior;Left;Proximal  Staging: Unstageable - Full thickness tissue loss in which the base of the injury is covered by slough (yellow, tan, gray, green or brown) and/or eschar (tan, brown or black) in the wound bed.  Wound Description (Comments): slough covered wound  Present on Admission: Yes  Dressing Type Foam - Lift dressing to assess site every shift 05/11/23 1100     Diet: Regular diet DVT Prophylaxis: Subcutaneous Heparin    Advance goals of care discussion: Full code  Family Communication: family was not present at bedside, at the time of interview.  The pt provided permission to discuss medical plan with the family. Opportunity was given to ask question and all questions were answered satisfactorily.   Disposition:  Pt is from group home, admitted with anasarca, [name], left hip ulcer, s/p debridement still on IV antibiotics, which precludes a safe discharge. Discharge to home with home health versus SNF, needs reevaluation by PT/OT DC plan in 1 to 2 days Follow TOC for disposition plan   Subjective: No significant events overnight, patient denies any shortness of breath, no chest pain or palpitations.  Patient is having weakness in the bilateral lower extremities, difficulty ambulation but states that she walks well once she starts walking.  No any other complaints.  Physical Exam: General: NAD, lying comfortably Appear in no distress, affect appropriate Eyes: PERRLA ENT: Oral Mucosa Clear, moist  Neck: no JVD,  Cardiovascular: S1 and S2 Present, no Murmur,  Respiratory: good respiratory effort, Bilateral Air entry equal and Decreased, no Crackles, no wheezes Abdomen: Bowel Sound present, Soft and no tenderness,  Skin: Left Hip ulcer status post debridement,  dressing CDI Extremities: no Pedal edema, no calf tenderness Neurologic: without any new focal findings Gait not checked due to patient safety concerns  Vitals:   05/11/23 0804 05/11/23 0808 05/11/23 0844 05/11/23 1200  BP: (!) 195/76 (!) 179/117 (!) 180/90 (!) 140/60  Pulse: 94 96    Resp: 16     Temp:      TempSrc:      SpO2: 98%     Weight:      Height:        Intake/Output Summary (Last 24 hours) at 05/11/2023 1425 Last data filed at 05/11/2023 0525 Gross per 24 hour  Intake 200 ml  Output 800 ml  Net -600 ml   Filed Weights   05/07/23 1618 05/09/23 0500 05/10/23 0433  Weight: 49 kg 53.4 kg 54 kg    Data Reviewed: I have personally reviewed and interpreted daily labs, tele strips, imagings as discussed above. I reviewed all nursing notes, pharmacy notes, vitals, pertinent old records I have discussed plan of care as described above with RN and patient/family.  CBC: Recent Labs  Lab 05/07/23 1619 05/08/23 0412 05/09/23 0819 05/10/23 0838  WBC 14.6* 13.3* 15.7* 13.3*  NEUTROABS 10.6*  --  12.0* 10.7*  HGB 8.4* 8.3* 9.1* 9.5*  HCT 27.3* 27.9* 29.5* 30.3*  MCV 93.2 93.3 91.9 90.7  PLT 458* 480* 541* 578*   Basic Metabolic Panel: Recent Labs  Lab 05/07/23 1619 05/08/23 0412 05/09/23 0819 05/10/23 0838  NA 138 140 140 138  K 3.2* 3.6 4.9 4.6  CL 99 102 102 100  CO2 27 27 27 24   GLUCOSE 158* 249* 159* 176*  BUN 33* 31* 30* 34*  CREATININE 2.85* 2.65* 2.70* 2.80*  CALCIUM 8.5* 8.5* 9.2 9.1  MG  --   --  2.0  --     Studies: No results found.  Scheduled Meds:  aspirin EC  81 mg Oral Daily   buPROPion ER  100 mg Oral Daily   calcitRIOL  0.25 mcg Oral Daily   cloNIDine  0.2 mg Oral BID   diltiazem  360 mg Oral Daily   escitalopram  20 mg Oral Daily   feeding supplement  237 mL Oral BID BM   fluticasone furoate-vilanterol  1 puff Inhalation Daily   And   umeclidinium bromide  1 puff Inhalation Daily   furosemide  40 mg Oral Daily   heparin  5,000  Units Subcutaneous Q8H   hydrALAZINE  50 mg Oral TID   insulin aspart  0-5 Units Subcutaneous QHS   insulin aspart  0-9 Units Subcutaneous TID WC   losartan  100 mg Oral Daily   melatonin  5 mg Oral QHS   nicotine  21 mg Transdermal Daily   potassium chloride  40 mEq Oral Daily   sodium hypochlorite   Irrigation BID   tamsulosin  0.4 mg Oral Daily   Continuous Infusions:  piperacillin-tazobactam (ZOSYN)  IV 3.375 g (05/11/23 0550)   vancomycin 750 mg (05/11/23 1218)   PRN Meds: acetaminophen **OR** acetaminophen, albuterol, guaiFENesin-dextromethorphan, labetalol, ondansetron **OR** ondansetron (ZOFRAN) IV, traMADol  Time spent: 35 minutes  Author: Gillis Santa. MD [facility] 05/11/2023 2:25 PM  To reach On-call, see care teams to locate the attending and reach out to them via [email or web address]. If 7PM-7AM, please contact night-coverage If you still have difficulty reaching the attending provider, please page the [facility] (Director on Call) for [facility] on amion for assistance.

## 2023-05-11 NOTE — [place] FL2 (Signed)
[place] MEDICAID [id number] LEVEL OF CARE FORM     IDENTIFICATION  Patient Name: Alicia Rogers Birthdate: [birth year] Sex: female Admission Date (Current Location): 05/07/2023  [facility] and [place] Number:  Chiropodist and Address:  [facility], [address] [place], [place] [address]      Provider Number: [id number]  Attending Physician Name and Address:  Gillis Santa, MD  Relative Name and Phone Number:  conya, freerksen (Sister)  [phone]    Current Level of Care: Hospital Recommended Level of Care: Skilled Nursing Facility Prior Approval Number:    Date Approved/Denied:   PASRR Number: pending  Discharge Plan: SNF    Current Diagnoses: Patient Active Problem List   Diagnosis Date Noted   Pressure injury of hip, unstageable (HCC) 05/09/2023   Acute urinary retention 05/08/2023   Bilateral lower extremity edema 05/08/2023   [name] (acute kidney injury) (HCC) 05/08/2023   Anasarca 05/07/2023   Hypoxia 05/07/2023   CKD (chronic kidney disease) stage 5, GFR less than 15 ml/min (HCC) 05/07/2023   Leukocytosis 05/07/2023   Bilateral pleural effusion 05/07/2023   Thyroid nodule 03/09/2023   Postoperative examination 11/03/2022   Acute-on-chronic kidney injury (HCC) 09/30/2022   Postoperative ileus (HCC) 09/30/2022   Postoperative intra-abdominal abscess (HCC) 09/30/2022   Chronic obstructive pulmonary disease (HCC) 09/03/2022   Vitamin D deficiency, unspecified 09/03/2022   Allergic rhinitis 09/03/2022   Tobacco abuse counseling 09/03/2022   Pneumonia due to respiratory syncytial virus (RSV) 08/30/2022   Pulmonary nodule 08/30/2022   Iron deficiency anemia 07/21/2022   Gastric erosion 07/21/2022   Polyp of duodenum 07/21/2022   Adenomatous polyp of descending colon 07/21/2022   Dysuria 06/30/2022   MGUS (monoclonal gammopathy of unknown significance) 02/05/2022   Stage 3b chronic kidney disease (HCC) 12/22/2021   Paranoid  schizophrenia (HCC) 11/25/2021   Proteinuria, unspecified 11/25/2021   Symptomatic anemia 11/25/2021   Schizoaffective disorder, bipolar type (HCC) 09/29/2021   Hypertensive urgency 09/28/2021   Sinus bradycardia 09/27/2021   Anemia in chronic kidney disease (CKD) 09/27/2021   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   Uterine mass 03/03/2021   Essential hypertension 02/28/2020   Back pain 02/28/2020   Hematuria 02/28/2020   Irritant contact dermatitis due to other agents 03/08/2018   Increased frequency of urination 09/21/2017   Chronic kidney disease, stage 4 (severe) (HCC) 08/26/2017   Low back pain 05/26/2017   Extrapyramidal symptom 03/12/2016   Tobacco use disorder 10/11/2014   Mixed Hyperlipidemia 07/04/2014   Claudication (HCC) 06/09/2014   Schizoaffective disorder, depressive type (HCC) 06/09/2014   Diabetes mellitus, type 2 (HCC) 06/09/2014   Type 2 diabetes mellitus with diabetic chronic kidney disease (HCC) 06/09/2014    Orientation RESPIRATION BLADDER Height & Weight     Self, Time, Place, Situation  Normal Incontinent Weight: 119 lb 0.8 oz (54 kg) Height:  5\' 2"  (157.5 cm)  BEHAVIORAL SYMPTOMS/MOOD NEUROLOGICAL BOWEL NUTRITION STATUS      Continent    AMBULATORY STATUS COMMUNICATION OF NEEDS Skin   Limited Assist Verbally  (PI on thigh unstageable)                       Personal Care Assistance Level of Assistance  Bathing, Feeding, Dressing Bathing Assistance: Limited assistance Feeding assistance: Limited assistance Dressing Assistance: Limited assistance     Functional Limitations Info  Sight, Hearing, Speech Sight Info: Adequate Hearing Info: Adequate Speech Info: Adequate    SPECIAL CARE FACTORS FREQUENCY  OT (By licensed OT), PT (  By licensed PT)     PT Frequency: 5 times a week OT Frequency: 5 times a week            Contractures Contractures Info: Not present    Additional Factors Info  Code Status, Allergies Code Status Info: FULL Allergies  Info: Penicillins  Lisinopril           Current Medications (05/11/2023):  This is the current hospital active medication list Current Facility-Administered Medications  Medication Dose Route Frequency Provider Last Rate Last Admin   acetaminophen (TYLENOL) tablet 650 mg  650 mg Oral Q6H PRN Andris Baumann, MD       Or   acetaminophen (TYLENOL) suppository 650 mg  650 mg Rectal Q6H PRN Andris Baumann, MD       albuterol (PROVENTIL) (2.5 MG/3ML) 0.083% nebulizer solution 2.5 mg  2.5 mg Nebulization Q2H PRN Andris Baumann, MD       aspirin EC tablet 81 mg  81 mg Oral Daily Lindajo Royal V, MD   81 mg at 05/11/23 0851   buPROPion ER (WELLBUTRIN SR) 12 hr tablet 100 mg  100 mg Oral Daily Lolita Patella B, MD   100 mg at 05/11/23 6045   calcitRIOL (ROCALTROL) capsule 0.25 mcg  0.25 mcg Oral Daily Lindajo Royal V, MD   0.25 mcg at 05/11/23 4098   cloNIDine (CATAPRES) tablet 0.2 mg  0.2 mg Oral BID Gillis Santa, MD   0.2 mg at 05/11/23 1191   diltiazem (CARDIZEM CD) 24 hr capsule 360 mg  360 mg Oral Daily Lindajo Royal V, MD   360 mg at 05/11/23 0851   escitalopram (LEXAPRO) tablet 20 mg  20 mg Oral Daily Lindajo Royal V, MD   20 mg at 05/11/23 0852   feeding supplement (ENSURE ENLIVE / ENSURE PLUS) liquid 237 mL  237 mL Oral BID BM Sreenath, Sudheer B, MD   237 mL at 05/11/23 1447   fluticasone furoate-vilanterol (BREO ELLIPTA) 200-25 MCG/ACT 1 puff  1 puff Inhalation Daily Otelia Sergeant, RPH   1 puff at 05/11/23 0900   And   umeclidinium bromide (INCRUSE ELLIPTA) 62.5 MCG/ACT 1 puff  1 puff Inhalation Daily Otelia Sergeant, RPH   1 puff at 05/11/23 0900   furosemide (LASIX) tablet 40 mg  40 mg Oral Daily Breeze, Gery Pray, NP   40 mg at 05/11/23 0850   guaiFENesin-dextromethorphan (ROBITUSSIN DM) 100-10 MG/5ML syrup 5 mL  5 mL Oral Q6H PRN Andris Baumann, MD       heparin injection 5,000 Units  5,000 Units Subcutaneous Q8H Andris Baumann, MD   5,000 Units at 05/11/23 1443   hydrALAZINE  (APRESOLINE) tablet 50 mg  50 mg Oral TID Andris Baumann, MD   50 mg at 05/11/23 0852   insulin aspart (novoLOG) injection 0-5 Units  0-5 Units Subcutaneous QHS Lindajo Royal V, MD       insulin aspart (novoLOG) injection 0-9 Units  0-9 Units Subcutaneous TID WC Andris Baumann, MD   3 Units at 05/11/23 1211   labetalol (NORMODYNE) injection 10 mg  10 mg Intravenous Q4H PRN Lolita Patella B, MD   10 mg at 05/10/23 1008   losartan (COZAAR) tablet 100 mg  100 mg Oral Daily Lindajo Royal V, MD   100 mg at 05/11/23 0851   melatonin tablet 5 mg  5 mg Oral QHS Andris Baumann, MD   5 mg at 05/10/23 2147   nicotine (NICODERM CQ -  dosed in mg/24 hours) patch 21 mg  21 mg Transdermal Daily Lindajo Royal V, MD   21 mg at 05/11/23 0857   ondansetron (ZOFRAN) tablet 4 mg  4 mg Oral Q6H PRN Andris Baumann, MD       Or   ondansetron [facility]) injection 4 mg  4 mg Intravenous Q6H PRN Andris Baumann, MD       piperacillin-tazobactam (ZOSYN) IVPB 3.375 g  3.375 g Intravenous Q8H Hunt, Madison H, RPH 12.5 mL/hr at 05/11/23 1441 3.375 g at 05/11/23 1441   potassium chloride SA (KLOR-CON M) CR tablet 40 mEq  40 mEq Oral Daily Andris Baumann, MD   40 mEq at 05/11/23 0850   sodium hypochlorite (DAKIN'S 1/2 STRENGTH) topical solution   Irrigation BID Lolita Patella B, MD   Given at 05/11/23 1102   tamsulosin (FLOMAX) capsule 0.4 mg  0.4 mg Oral Daily Lindajo Royal V, MD   0.4 mg at 05/11/23 0851   traMADol (ULTRAM) tablet 50 mg  50 mg Oral BID PRN Andris Baumann, MD   50 mg at 05/09/23 2031   vancomycin (VANCOREADY) IVPB 750 mg/150 mL  750 mg Intravenous Q48H Merryl Hacker, RPH 150 mL/hr at 05/11/23 1218 750 mg at 05/11/23 1218     Discharge Medications: Please see discharge summary for a list of discharge medications.  Relevant Imaging Results:  Relevant Lab Results:   Additional Information SS [id number]  Allena Katz, LCSW

## 2023-05-11 NOTE — Consult Note (Addendum)
Consultation Note Date: 05/11/2023 at 1210  Patient Name: Alicia Rogers  DOB: [birth year]  MRN: [id number]  Age / Sex: 62 y.o., female  PCP: Patient, No Pcp Per Referring Physician: Gillis Santa, MD  HPI/Patient Profile: 62 y.o. female  with past medical history of type 2 diabetes, CKD, MGUS, schizoaffective disorder, HTN, secondary hyperparathyroidism, and hip wounds admitted from a group home on 05/07/2023 with fluid retention/lower extremity edema.  Patient is being treated for anasarca, [name], and acute on chronic CHF.  Additionally, her hip wound was debrided at bedside.  Patient remains on antibiotics with no current need for surgical debridement.  PMT was consulted to discuss goals of care.   Clinical Assessment and Goals of Care: Extensive chart review completed prior to meeting patient including labs, vital signs, imaging, progress notes, orders, and available advanced directive documents from current and previous encounters. I then met with patient at bedside to discuss diagnosis prognosis, GOC, EOL wishes, disposition and options.  I introduced Palliative Medicine as specialized medical care for people living with serious illness. It focuses on providing relief from the symptoms and stress of a serious illness. The goal is to improve quality of life for both the patient and the family.  We discussed a brief life review of the patient.  She shares she is 1 of 6 siblings (2 sisters, 3 brothers).  She was never married and has no children.  She shares she used to enjoy watching TV but does not enjoy TV anymore.  She has a flat affect and is not willing to share many details about her personal life with me at this time.  We discussed patient's current illness and what it means in the larger context of patient's on-going co-morbidities.  Education on wound healing, debridement, and type 2 diabetes as  contributing factors to patient's healing as well as patient's overall functional status.    I attempted to elicit values and goals of care important to the patient.  Patient shares she is ready to get out of the hospital.  She shares she is tired looking at the same for walls.  Therapeutic silence, active listening, and emotional support provided.  Patient shares she would either like to go back to her group home or stay with her friend and Charity fundraiser.  She shares she would like to speak with her sister in hopes of getting in touch with her friend in [place].  Advance directives, concepts specific to code status, artificial feeding and hydration, and rehospitalization were considered and discussed.  Patient shares she has never created an advanced directive.  In the event that she is unable to speak for herself.  She shares she would like her sister and her friend to be her next of kin decision makers.  We discussed that in the absence of an HCPOA, the majority of patients reasonably available siblings with make medical decisions on her behalf.  Patient shares she would not want this.  I provided a copy of advance directive for her to review and  offered to answer any questions she may have.  She says she will look at it at a later time and asked that I set it aside for now.  We discussed CODE STATUS.  In the event that patient has a cardiopulmonary arrest, patient shares she has never really thought about what she would want. She is unsure if she would be accepting of a ventilator but is not ready to make any changes to her plan of care at this time.  I discussed that without making changes, patient is remaining a full code - accepting of all offered, available, and appropriate medical interventions to sustain her life. She agreed.   I offered to speak with her sister in regards to our discussion.  Patient declined saying that she would like to speak with her sister by herself.  I handed her the phone, made sure  her needs were met, and shared that I would follow-up with her tomorrow.   Ongoing discussion of ADs and goals to continue.   No adjustment to [facility] needed at this time.  Primary Decision Maker PATIENT  Physical Exam Vitals reviewed.  Constitutional:      General: She is not in acute distress.    Appearance: She is normal weight. She is not ill-appearing.  HENT:     Head: Normocephalic.     Mouth/Throat:     Mouth: Mucous membranes are moist.  Eyes:     Pupils: Pupils are equal, round, and reactive to light.  Cardiovascular:     Rate and Rhythm: Normal rate.  Pulmonary:     Effort: Pulmonary effort is normal.  Abdominal:     Palpations: Abdomen is soft.  Skin:    General: Skin is dry.     Comments: UTA hip wounds  Neurological:     Mental Status: She is alert and oriented to person, place, and time.  Psychiatric:        Mood and Affect: Mood normal.        Behavior: Behavior normal.        Thought Content: Thought content normal.        Judgment: Judgment normal.     Palliative Assessment/Data: 50-60%     Thank you for this consult. Palliative medicine will continue to follow and assist holistically.   Time Total: 75 minutes  Time spent includes: Detailed review of medical records (labs, imaging, vital signs), medically appropriate exam (mental status, respiratory, cardiac, skin), discussed with treatment team, counseling and educating patient, family and staff, documenting clinical information, medication management and coordination of care.  Signed by: Georgiann Cocker, DNP, FNP-BC Palliative Medicine   Please contact Palliative Medicine Team providers via [facility] for questions and concerns.

## 2023-05-11 NOTE — TOC Progression Note (Signed)
Transition of Care [facility]) - Progression Note    Patient Details  Name: Alicia Rogers MRN: [id number] Date of Birth: 1961-01-10  Transition of Care [facility]) CM/SW Contact  Allena Katz, LCSW Phone Number: 05/11/2023, 2:54 PM  Clinical Narrative:    [facility] with [facility] reports pt is unable to return. RN reports pt is weak and feel may need SNF. PT to reassess for SNF.         Expected Discharge Plan and Services                                               Social Determinants of Health (SDOH) Interventions SDOH Screenings   Food Insecurity: No Food Insecurity (03/04/2023)   Received from [facility]  Housing: Patient Declined (08/30/2022)  Transportation Needs: No Transportation Needs (03/04/2023)   Received from [facility]  Utilities: Not At Risk (03/04/2023)   Received from [facility]  Alcohol Screen: Low Risk  (09/29/2021)  Financial Resource Strain: Medium Risk (03/04/2023)   Received from [facility]  Social Connections: Unknown (09/28/2022)   Received from [facility]  Tobacco Use: High Risk (05/07/2023)    Readmission Risk Interventions    05/10/2023   10:05 AM  Readmission Risk Prevention Plan  Transportation Screening Complete  HRI or Home Care Consult Complete  Palliative Care Screening Complete  Medication Review (RN Care Manager) Complete

## 2023-05-11 NOTE — Progress Notes (Signed)
Initial Nutrition Assessment  DOCUMENTATION CODES:   Non-severe (moderate) malnutrition in context of chronic illness  INTERVENTION:   -Continue Ensure Enlive po BID, each supplement provides 350 kcal and 20 grams of protein -MVI with minerals daily -500 mg vitamin C BID -220 mg zinc sulfate daily -Feeding assistance with meals  NUTRITION DIAGNOSIS:   Moderate Malnutrition related to chronic illness (CKD) as evidenced by mild fat depletion, moderate fat depletion, mild muscle depletion, moderate muscle depletion.  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  Pt admitted with anasarca, stage V CKD, and bilateral pleural effusions.   Reviewed I/O's: -1 L x 24 hours and -2.9 L since admission  UOP: 1.2 L x 24 hours  Spoke with pt at bedside. Pt not very talkative, but reports feeling better since admission. Pt reports good appetite, consumed 50% of omelette this morning. PTA, pt reports good appetite and consumes 3 meals per day, but unable to provide diet recall.   Pt unsure of UBW. Pt reports she has lost weight secondary to diuresis and notes that "my legs are a lot smaller". Reviewed wt hx; wt has been stable over the past 6 months.   Discussed importance of good meal and supplement intake to promote healing. Pt amenable to supplements.   Per palliative care notes, pt desires full code.    Per TOC notes, plan for SNF placement at  discharge.   Medications reviewed and include cardizem, melatonin, and potassium chloride.   Lab Results  Component Value Date   HGBA1C 7.6 (H) 05/08/2023   PTA DM medications are none.   Labs reviewed: CBGS: 120-215 (inpatient orders for glycemic control are 0-5 units insulin aspart daily at bedtime and 0-9 units insulin aspart TID with meals).    NUTRITION - FOCUSED PHYSICAL EXAM:  Flowsheet Row Most Recent Value  Orbital Region Moderate depletion  Upper Arm Region Mild depletion  Thoracic and Lumbar Region No depletion  Buccal Region No depletion  Temple Region No depletion  Clavicle Bone Region Mild depletion  Clavicle and Acromion Bone Region Mild depletion  Scapular Bone Region Mild depletion  Dorsal Hand Moderate depletion  Patellar Region Moderate depletion  Anterior Thigh Region Moderate depletion  Posterior Calf Region Moderate depletion  Edema (RD Assessment) Mild  Hair Reviewed  Eyes Reviewed  Mouth Reviewed  Skin Reviewed  Nails Reviewed       Diet Order:   Diet Order             Diet regular Fluid consistency: Thin  Diet effective now                   EDUCATION NEEDS:   Education needs have been addressed  Skin:  Skin Assessment: Skin Integrity Issues: Skin Integrity Issues:: Unstageable, Other (Comment) Unstageable: lt thigh Other: rt thigh fluid filled blister  Last BM:  05/11/23 (type 5)  Height:   Ht Readings from Last 1 Encounters:  05/07/23 5\' 2"  (1.575 m)  Weight:   Wt Readings from Last 1 Encounters:  05/10/23 54 kg    Ideal Body Weight:  50 kg  BMI:  Body mass index is 21.77 kg/m.  Estimated Nutritional Needs:   Kcal:     Protein:     Fluid:       Levada Schilling, RD, LDN, CDCES Registered Dietitian III Certified Diabetes Care and Education Specialist Please refer to [facility] for RD and/or RD on-call/weekend/after hours pager

## 2023-05-11 NOTE — Progress Notes (Signed)
Physical Therapy Treatment Patient Details Name: Alicia Rogers MRN: [id number] DOB: 1960/11/25 Today's Date: 05/11/2023   History of Present Illness Pt is a 62 y.o. 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    PT Comments  Pt is received in recliner, she is agreeable to PT session. Pt performs transfer and amb CGA for safety due to reports of slight B hip discomfort. Pt able to amb approx 8 ft using RW demonstrating shuffling gait and reports of mild discomfort when taking steps leading to discontinue of activity. Additionally, Pt able to perform standing BLE exercise to promote strength but limited due to B hip discomfort. Communicated with care team regarding change of discharge disposition via secure chat. Pt would benefit from cont skilled PT to address above deficits and promote optimal return to PLOF.    If plan is discharge home, recommend the following: A little help with walking and/or transfers;A little help with bathing/dressing/bathroom;Assist for transportation;Help with stairs or ramp for entrance;Supervision due to cognitive status   Can travel by private vehicle     No  Equipment Recommendations  Other (comment) (TBD at next facility)    Recommendations for Other Services       Precautions / Restrictions Precautions Precautions: Fall Precaution Comments: B hip wounds Restrictions Weight Bearing Restrictions: No     Mobility  Bed Mobility               General bed mobility comments: Deferred at this time due to Pt  received in recliner and ended in recliner    Transfers Overall transfer level: Needs assistance Equipment used: Rolling walker (2 wheels) Transfers: Sit to/from Stand Sit to Stand: Contact guard assist           General transfer comment: increased time for processing commands and initiating movement; cuing for hand placement on AD    Ambulation/Gait Ambulation/Gait assistance: Contact guard assist Gait Distance (Feet): 8 Feet Assistive device: Rolling walker (2 wheels) Gait Pattern/deviations: Step-to pattern, Decreased step length - right, Decreased step length - left, Decreased stride length, Shuffle Gait velocity: decreased     General Gait Details: able to amb approx 8 ft using RW with seated break midway; cuing for AD management   Stairs             Wheelchair Mobility     Tilt Bed    Modified Rankin (Stroke Patients Only)       Balance Overall balance assessment: Needs assistance Sitting-balance support: No upper extremity supported, Feet supported Sitting balance-Leahy Scale: Good Sitting balance - Comments: Able to maintain seated edge of seat balance during functional activities   Standing balance support: Reliant on assistive device for balance, During functional activity, Bilateral upper extremity supported Standing balance-Leahy Scale: Good Standing balance comment: able to maintain static standing balance using RW during functional activities                            Cognition Arousal: Alert Behavior During Therapy: Flat affect Overall Cognitive Status: No family/caregiver present to determine baseline cognitive functioning  General Comments: increased time to process and respond to questions; flat affect and very soft spoken; AO x4        Exercises Other Exercises Other Exercises: x10 ea standing heel raises using RW CGA for safety; slight reports of B hip discomfort     General Comments        Pertinent Vitals/Pain Pain Assessment Pain Assessment: Faces Faces Pain Scale: Hurts a little bit Pain Location: bilat hips Pain Descriptors / Indicators: Guarding, Discomfort Pain Intervention(s): Monitored during session, Limited activity within patient's tolerance, Repositioned    Home Living                          Prior Function            PT Goals (current goals can now be found in the care plan section) Acute Rehab PT Goals Patient Stated Goal: to get better PT Goal Formulation: With patient Time For Goal Achievement: 05/23/23 Potential to Achieve Goals: Good Progress towards PT goals: Progressing toward goals    Frequency    Min 1X/week      PT Plan      Co-evaluation              AM-PAC PT "6 Clicks" Mobility   Outcome Measure  Help needed turning from your back to your side while in a flat bed without using bedrails?: None Help needed moving from lying on your back to sitting on the side of a flat bed without using bedrails?: None Help needed moving to and from a bed to a chair (including a wheelchair)?: A Little Help needed standing up from a chair using your arms (e.g., wheelchair or bedside chair)?: A Little Help needed to walk in hospital room?: A Little Help needed climbing 3-5 steps with a railing? : A Lot 6 Click Score: 19    End of Session Equipment Utilized During Treatment: Gait belt Activity Tolerance: Patient limited by fatigue Patient left: in chair;with call bell/phone within reach;with chair alarm set Nurse Communication: Mobility status PT Visit Diagnosis: Unsteadiness on feet (R26.81);Muscle weakness (generalized) (M62.81);Pain Pain - Right/Left: Left (bilateral) Pain - part of body: Hip     Time: 5784-6962 PT Time Calculation (min) (ACUTE ONLY): 13 min  Charges:                            Elmon Else, SPT    Jazell Rosenau 05/11/2023, 3:36 PM

## 2023-05-11 NOTE — Progress Notes (Signed)
[facility]  ROUNDING NOTE   Subjective:   [name] was admitted to [facility] on 05/07/2023 for Anasarca [R60.1] Peripheral edema [R60.0] [name] (acute kidney injury) (HCC) [N17.9] Acute hypoxemic respiratory failure (HCC) [J96.01] Acute on chronic congestive heart failure, unspecified heart failure type [facility]) [I50.9]  Patient was seen by Dr. Thedore Mins on 9/4 where she was referred for vein mapping for dialysis planning. Her diuretics were adjusted at that time.   Patient sitting up in bed Alert Denies nausea Abd pain has decreased  UOP 1.2L  Objective:  Vital signs in last 24 hours:  Temp:  [98.2 F (36.8 C)-99.2 F (37.3 C)] 98.2 F (36.8 C) (10/08 1935) Pulse Rate:  [92-96] 96 (10/09 0808) Resp:  [16-18] 16 (10/09 0804) BP: (140-195)/(60-117) 140/60 (10/09 1200) SpO2:  [98 %-100 %] 98 % (10/09 0804)  Weight change:  Filed Weights   05/07/23 1618 05/09/23 0500 05/10/23 0433  Weight: 49 kg 53.4 kg 54 kg    Intake/Output: I/O last 3 completed shifts: In: 200 [IV Piggyback:200] Out: 2000 [Urine:2000]   Intake/Output this shift:  No intake/output data recorded.  Physical Exam: General: NAD  Head: Normocephalic, atraumatic. Moist oral mucosal membranes  Eyes: Anicteric  Lungs:  Clear to auscultation  Heart: Regular rate and rhythm  Abdomen:  Soft, nontender  Extremities:  Trace peripheral edema.  Neurologic: Alert, moving all four extremities  Skin: No lesions  Access: none    Basic Metabolic Panel: Recent Labs  Lab 05/07/23 1619 05/08/23 0412 05/09/23 0819 05/10/23 0838  NA 138 140 140 138  K 3.2* 3.6 4.9 4.6  CL 99 102 102 100  CO2 27 27 27 24   GLUCOSE 158* 249* 159* 176*  BUN 33* 31* 30* 34*  CREATININE 2.85* 2.65* 2.70* 2.80*  CALCIUM 8.5* 8.5* 9.2 9.1  MG  --   --  2.0  --     Liver Function Tests: Recent Labs  Lab 05/07/23 1619  AST 22  ALT 22  ALKPHOS 105  BILITOT 0.5  PROT 7.1  ALBUMIN 2.0*   No results for  input(s): "LIPASE", "AMYLASE" in the last 168 hours. No results for input(s): "AMMONIA" in the last 168 hours.  CBC: Recent Labs  Lab 05/07/23 1619 05/08/23 0412 05/09/23 0819 05/10/23 0838  WBC 14.6* 13.3* 15.7* 13.3*  NEUTROABS 10.6*  --  12.0* 10.7*  HGB 8.4* 8.3* 9.1* 9.5*  HCT 27.3* 27.9* 29.5* 30.3*  MCV 93.2 93.3 91.9 90.7  PLT 458* 480* 541* 578*    Cardiac Enzymes: No results for input(s): "CKTOTAL", "CKMB", "CKMBINDEX", "TROPONINI" in the last 168 hours.  BNP: Invalid input(s): "POCBNP"  CBG: Recent Labs  Lab 05/10/23 1119 05/10/23 1757 05/10/23 2047 05/11/23 0808 05/11/23 1123  GLUCAP 169* 157* 163* 120* 215*    Microbiology: Results for orders placed or performed during the hospital encounter of 05/07/23  Resp panel by RT-PCR (RSV, Flu A&B, Covid) Anterior Nasal Swab     Status: None   Collection Time: 05/07/23 10:26 PM   Specimen: Anterior Nasal Swab  Result Value Ref Range Status   SARS Coronavirus 2 by RT PCR NEGATIVE NEGATIVE Final    Comment: (NOTE) SARS-CoV-2 target nucleic acids are NOT DETECTED.  The SARS-CoV-2 RNA is generally detectable in upper respiratory specimens during the acute phase of infection. The lowest concentration of SARS-CoV-2 viral copies this assay can detect is 138 copies/mL. A negative result does not preclude SARS-Cov-2 infection and should not be used as the sole  basis for treatment or other patient management decisions. A negative result may occur with  improper specimen collection/handling, submission of specimen other than nasopharyngeal swab, presence of viral mutation(s) within the areas targeted by this assay, and inadequate number of viral copies(<138 copies/mL). A negative result must be combined with clinical observations, patient history, and epidemiological information. The expected result is Negative.  Fact Sheet for Patients:  BloggerCourse.com  Fact Sheet for Healthcare  Providers:  SeriousBroker.it  This test is no t yet approved or cleared by the [place] FDA and  has been authorized for detection and/or diagnosis of SARS-CoV-2 by FDA under an Emergency Use Authorization (EUA). This EUA will remain  in effect (meaning this test can be used) for the duration of the COVID-19 declaration under Section 564(b)(1) of the Act, 21 U.S.C.section 360bbb-3(b)(1), unless the authorization is terminated  or revoked sooner.       Influenza A by PCR NEGATIVE NEGATIVE Final   Influenza B by PCR NEGATIVE NEGATIVE Final    Comment: (NOTE) The Xpert Xpress SARS-CoV-2/FLU/RSV plus assay is intended as an aid in the diagnosis of influenza from Nasopharyngeal swab specimens and should not be used as a sole basis for treatment. Nasal washings and aspirates are unacceptable for Xpert Xpress SARS-CoV-2/FLU/RSV testing.  Fact Sheet for Patients: BloggerCourse.com  Fact Sheet for Healthcare Providers: SeriousBroker.it  This test is not yet approved or cleared by the [place] FDA and has been authorized for detection and/or diagnosis of SARS-CoV-2 by FDA under an Emergency Use Authorization (EUA). This EUA will remain in effect (meaning this test can be used) for the duration of the COVID-19 declaration under Section 564(b)(1) of the Act, 21 U.S.C. section 360bbb-3(b)(1), unless the authorization is terminated or revoked.     Resp Syncytial Virus by PCR NEGATIVE NEGATIVE Final    Comment: (NOTE) Fact Sheet for Patients: BloggerCourse.com  Fact Sheet for Healthcare Providers: SeriousBroker.it  This test is not yet approved or cleared by the [place] FDA and has been authorized for detection and/or diagnosis of SARS-CoV-2 by FDA under an Emergency Use Authorization (EUA). This EUA will remain in effect (meaning this test can be  used) for the duration of the COVID-19 declaration under Section 564(b)(1) of the Act, 21 U.S.C. section 360bbb-3(b)(1), unless the authorization is terminated or revoked.  Performed at [facility], [address] [place], [place] [address]   Aerobic/Anaerobic Culture w Gram Stain (surgical/deep wound)     Status: None (Preliminary result)   Collection Time: 05/09/23 11:30 AM   Specimen: Wound  Result Value Ref Range Status   Specimen Description   Final    WOUND Performed at [facility], [address], [place], [place] [address]    Special Requests   Final    LEFT HIP Performed at [facility], [address] [place], [place] [address]    Gram Stain   Final    RARE WBC PRESENT, PREDOMINANTLY PMN MODERATE GRAM NEGATIVE RODS MODERATE GRAM POSITIVE COCCI IN PAIRS    Culture   Final    ABUNDANT STAPHYLOCOCCUS AUREUS ABUNDANT STREPTOCOCCUS MITIS/ORALIS SUSCEPTIBILITIES TO FOLLOW Performed at [facility], [address], [place], [place] [address]    Report Status PENDING  Incomplete    Coagulation Studies: No results for input(s): "LABPROT", "INR" in the last 72 hours.  Urinalysis: No results for input(s): "COLORURINE", "LABSPEC", "PHURINE", "GLUCOSEU", "HGBUR", "BILIRUBINUR", "KETONESUR", "PROTEINUR", "UROBILINOGEN", "NITRITE", "LEUKOCYTESUR" in the last 72 hours.  Invalid input(s): "  APPERANCEUR"     Imaging: ECHOCARDIOGRAM COMPLETE  Result Date: 05/10/2023    ECHOCARDIOGRAM REPORT   Patient Name:   Alicia Rogers Date of Exam: 05/10/2023 Medical Rec #:  [id number]          Height:       62.0 in Accession #:    [id number]         Weight:       119.0 lb Date of Birth:  1961-06-06           BSA:          1.533 m Patient Age:    62 years           BP:           183/66 mmHg Patient Gender: F                  HR:           78 bpm. Exam Location:  [facility] Procedure: 2D Echo, Cardiac Doppler and Color Doppler Indications:     CHF-acute  diastolic I50.31  History:         Patient has no prior history of Echocardiogram examinations.                  Risk Factors:Diabetes and Hypertension. Stage 3                  CKD.  Sonographer:     Cristela Blue Referring Phys:  [id number] Andris Baumann Diagnosing Phys: Yvonne Kendall MD IMPRESSIONS  1. Left ventricular ejection fraction, by estimation, is 60 to 65%. The left ventricle has normal function. The left ventricle has no regional wall motion abnormalities. Left ventricular diastolic parameters are indeterminate.  2. Right ventricular systolic function is normal. The right ventricular size is normal. There is mildly elevated pulmonary artery systolic pressure.  3. The mitral valve is normal in structure. Trivial mitral valve regurgitation. No evidence of mitral stenosis.  4. The aortic valve has an indeterminant number of cusps. Aortic valve regurgitation is not visualized. Mild aortic valve stenosis.  5. The inferior vena cava is normal in size with greater than 50% respiratory variability, suggesting right atrial pressure of 3 mmHg. FINDINGS  Left Ventricle: Left ventricular ejection fraction, by estimation, is 60 to 65%. The left ventricle has normal function. The left ventricle has no regional wall motion abnormalities. The left ventricular internal cavity size was normal in size. There is  borderline left ventricular hypertrophy. Left ventricular diastolic parameters are indeterminate. Right Ventricle: The right ventricular size is normal. No increase in right ventricular wall thickness. Right ventricular systolic function is normal. There is mildly elevated pulmonary artery systolic pressure. The tricuspid regurgitant velocity is 3.13  m/s, and with an assumed right atrial pressure of 3 mmHg, the estimated right ventricular systolic pressure is 42.2 mmHg. Left Atrium: Left atrial size was normal in size. Right Atrium: Right atrial size was normal in size. Pericardium: There is no evidence of  pericardial effusion. Mitral Valve: The mitral valve is normal in structure. Trivial mitral valve regurgitation. No evidence of mitral valve stenosis. Tricuspid Valve: The tricuspid valve is not well visualized. Tricuspid valve regurgitation is mild. Aortic Valve: The aortic valve has an indeterminant number of cusps. Aortic valve regurgitation is not visualized. Mild aortic stenosis is present. Aortic valve mean gradient measures 10.7 mmHg. Aortic valve peak gradient measures 19.1 mmHg. Aortic valve  area, by VTI measures 1.70 cm. Pulmonic Valve: The  pulmonic valve was not well visualized. Pulmonic valve regurgitation is not visualized. No evidence of pulmonic stenosis. Aorta: The aortic root is normal in size and structure. Pulmonary Artery: The pulmonary artery is of normal size. Venous: The inferior vena cava is normal in size with greater than 50% respiratory variability, suggesting right atrial pressure of 3 mmHg. IAS/Shunts: The interatrial septum was not well visualized.  LEFT VENTRICLE PLAX 2D LVIDd:         4.30 cm   Diastology LVIDs:         2.90 cm   LV e' medial:    8.70 cm/s LV PW:         1.10 cm   LV E/e' medial:  12.8 LV IVS:        0.90 cm   LV e' lateral:   8.38 cm/s LVOT diam:     2.00 cm   LV E/e' lateral: 13.2 LV SV:         74 LV SV Index:   48 LVOT Area:     3.14 cm  RIGHT VENTRICLE RV Basal diam:  3.10 cm RV Mid diam:    2.40 cm RV S prime:     18.50 cm/s TAPSE (M-mode): 2.3 cm LEFT ATRIUM           Index        RIGHT ATRIUM          Index LA diam:      3.60 cm 2.35 cm/m   RA Area:     8.91 cm LA Vol (A4C): 38.4 ml 25.07 ml/m  RA Volume:   18.30 ml 11.93 ml/m  AORTIC VALVE AV Area (Vmax):    1.58 cm AV Area (Vmean):   1.59 cm AV Area (VTI):     1.70 cm AV Vmax:           218.67 cm/s AV Vmean:          151.333 cm/s AV VTI:            0.435 m AV Peak Grad:      19.1 mmHg AV Mean Grad:      10.7 mmHg LVOT Vmax:         110.00 cm/s LVOT Vmean:        76.800 cm/s LVOT VTI:          0.235  m LVOT/AV VTI ratio: 0.54  AORTA Ao Root diam: 2.20 cm MITRAL VALVE                TRICUSPID VALVE MV Area (PHT): 3.34 cm     TR Peak grad:   39.2 mmHg MV Decel Time: 227 msec     TR Vmax:        313.00 cm/s MV E velocity: 111.00 cm/s MV A velocity: 140.00 cm/s  SHUNTS MV E/A ratio:  0.79         Systemic VTI:  0.24 m                             Systemic Diam: 2.00 cm Yvonne Kendall MD Electronically signed by Yvonne Kendall MD Signature Date/Time: 05/10/2023/12:19:51 PM    Final    MR HIP LEFT W WO CONTRAST  Result Date: 05/09/2023 CLINICAL DATA:  Fall 4 months ago.  Worsening left hip pain EXAM: MRI OF THE LEFT HIP WITHOUT AND WITH CONTRAST TECHNIQUE: Multiplanar, multisequence MR imaging was performed both before and after  administration of intravenous contrast. CONTRAST:  5mL GADAVIST GADOBUTROL 1 MMOL/ML IV SOLN COMPARISON:  Radiographs 03/17/2023 FINDINGS: Bones: No osteomyelitis observed. No significant abnormal marrow edema. Articular cartilage and labrum Articular cartilage:  Unremarkable Labrum:  Unremarkable Joint or bursal effusion Joint effusion:  Small left hip joint effusion. Bursae: No regional bursitis Muscles and tendons Muscles and tendons: Indistinct intramuscular edema in the contralateral (right) gluteus maximus and upper vastus musculature. Other findings Miscellaneous: A suspected wound along the lateral margin of the left gluteus maximus muscle and hip extends somewhat deep to the iliotibial band towards the greater trochanter, but without abnormal intraosseous signal. Correlate with visual inspection overlying the left hip in assessing this wound. The wound is best shown on images 19 through 27 of series 15. IMPRESSION: 1. Wound along the lateral margin of the left hip, extending deep to the iliotibial band towards the greater trochanter, but without abnormal intraosseous signal to suggest osteomyelitis. Correlate with visual inspection overlying the left hip in assessing this wound.  2. Small left hip joint effusion. 3. Indistinct intramuscular edema in the contralateral (right) gluteus maximus and upper vastus musculature. Electronically Signed   By: Gaylyn Rong M.D.   On: 05/09/2023 18:06     Medications:    piperacillin-tazobactam (ZOSYN)  IV 3.375 g (05/11/23 0550)   vancomycin 750 mg (05/11/23 1218)    aspirin EC  81 mg Oral Daily   buPROPion ER  100 mg Oral Daily   calcitRIOL  0.25 mcg Oral Daily   cloNIDine  0.2 mg Oral BID   diltiazem  360 mg Oral Daily   escitalopram  20 mg Oral Daily   feeding supplement  237 mL Oral BID BM   fluticasone furoate-vilanterol  1 puff Inhalation Daily   And   umeclidinium bromide  1 puff Inhalation Daily   furosemide  40 mg Oral Daily   heparin  5,000 Units Subcutaneous Q8H   hydrALAZINE  50 mg Oral TID   insulin aspart  0-5 Units Subcutaneous QHS   insulin aspart  0-9 Units Subcutaneous TID WC   losartan  100 mg Oral Daily   melatonin  5 mg Oral QHS   nicotine  21 mg Transdermal Daily   potassium chloride  40 mEq Oral Daily   sodium hypochlorite   Irrigation BID   tamsulosin  0.4 mg Oral Daily   acetaminophen **OR** acetaminophen, albuterol, guaiFENesin-dextromethorphan, labetalol, ondansetron **OR** ondansetron (ZOFRAN) IV, traMADol  Assessment/ Plan:  [name] is a 62 y.o.  female with schizophrenia, diabetes mellitus type II, hypertension, tobacco use, hyperlipidemia who is admitted to [facility] on 05/07/2023 for Anasarca [R60.1] Peripheral edema [R60.0] [name] (acute kidney injury) (HCC) [N17.9] Acute hypoxemic respiratory failure (HCC) [J96.01] Acute on chronic congestive heart failure, unspecified heart failure type (HCC) [I50.9]  Chronic Kidney Disease stage V with nephrotic range proteinuria: baseline creatinine 3.42, GFR of 14 on 8/5. Chronic kidney disease secondary to diabetic nephropathy.  Renal ultrasound negative for hydronephrosis.  - Decreased muscle mass may be affecting creatinine.  -  Oral furosemide 40mg  daily today - Continue sodium and fluid restriction - Continue holding dapagliflozin, hydrochlorothiazide and sodium bicarbonate.    Hypertension: with suspected acute exacerbation of congestive heart failure. Volume driven - ECHO EF shows 60-65% - Current regimen of tamsulosin, hydralazine, and diltiazem.  - Holding hydrochlorothiazide.   Diabetes mellitus type II with chronic kidney disease: noninsulin dependent.  - Holding dapagliflozin - Primary team to manage SSi  Secondary Hyperparathyroidism:  -  continue calcitriol - Will continue to monitor bone minerals during this admission   LOS: 4 Tasnim Balentine 10/9/20241:30 PM

## 2023-05-11 NOTE — TOC PASRR Note (Addendum)
RE: Zafiro Routson Date of Birth: [birth year] Date: 05/11/2023     To Whom It May Concern:   Please be advised that the above-named patient will require a short-term nursing home stay - anticipated 30 days or less for rehabilitation and strengthening.  The plan is for return home

## 2023-05-12 DIAGNOSIS — R601 Generalized edema: Secondary | ICD-10-CM | POA: Diagnosis not present

## 2023-05-12 DIAGNOSIS — F250 Schizoaffective disorder, bipolar type: Secondary | ICD-10-CM | POA: Diagnosis not present

## 2023-05-12 DIAGNOSIS — E440 Moderate protein-calorie malnutrition: Secondary | ICD-10-CM | POA: Insufficient documentation

## 2023-05-12 DIAGNOSIS — Z515 Encounter for palliative care: Secondary | ICD-10-CM | POA: Diagnosis not present

## 2023-05-12 LAB — CBC
HCT: 27.8 % — ABNORMAL LOW (ref 36.0–46.0)
Hemoglobin: 8.7 g/dL — ABNORMAL LOW (ref 12.0–15.0)
MCH: 28.1 pg (ref 26.0–34.0)
MCHC: 31.3 g/dL (ref 30.0–36.0)
MCV: 89.7 fL (ref 80.0–100.0)
Platelets: 594 10*3/uL — ABNORMAL HIGH (ref 150–400)
RBC: 3.1 MIL/uL — ABNORMAL LOW (ref 3.87–5.11)
RDW: 14.5 % (ref 11.5–15.5)
WBC: 9.1 10*3/uL (ref 4.0–10.5)
nRBC: 0 % (ref 0.0–0.2)

## 2023-05-12 LAB — GLUCOSE, CAPILLARY
Glucose-Capillary: 157 mg/dL — ABNORMAL HIGH (ref 70–99)
Glucose-Capillary: 212 mg/dL — ABNORMAL HIGH (ref 70–99)
Glucose-Capillary: 114 mg/dL — ABNORMAL HIGH (ref 70–99)
Glucose-Capillary: 137 mg/dL — ABNORMAL HIGH (ref 70–99)

## 2023-05-12 LAB — BASIC METABOLIC PANEL
Anion gap: 13 (ref 5–15)
BUN: 42 mg/dL — ABNORMAL HIGH (ref 8–23)
CO2: 27 mmol/L (ref 22–32)
Calcium: 8.9 mg/dL (ref 8.9–10.3)
Chloride: 98 mmol/L (ref 98–111)
Creatinine, Ser: 3.15 mg/dL — ABNORMAL HIGH (ref 0.44–1.00)
GFR, Estimated: 16 mL/min — ABNORMAL LOW (ref >60)
Glucose, Bld: 150 mg/dL — ABNORMAL HIGH (ref 70–99)
Potassium: 4.3 mmol/L (ref 3.5–5.1)
Sodium: 138 mmol/L (ref 135–145)

## 2023-05-12 LAB — AEROBIC/ANAEROBIC CULTURE W GRAM STAIN (SURGICAL/DEEP WOUND)

## 2023-05-12 LAB — MAGNESIUM: Magnesium: 2.4 mg/dL (ref 1.7–2.4)

## 2023-05-12 LAB — PHOSPHORUS: Phosphorus: 5.1 mg/dL — ABNORMAL HIGH (ref 2.5–4.6)

## 2023-05-12 MED ORDER — CLONIDINE HCL 0.1 MG PO TABS
0.1000 mg | ORAL_TABLET | Freq: Three times a day (TID) | ORAL | Status: DC
  Administered 2023-05-12 – 2023-05-16 (×10): 0.1 mg via ORAL
  Filled 2023-05-12 (×11): qty 1

## 2023-05-12 MED ORDER — BLISTEX MEDICATED EX OINT
TOPICAL_OINTMENT | CUTANEOUS | Status: DC | PRN
  Filled 2023-05-12: qty 6.3

## 2023-05-12 MED ORDER — LINEZOLID 600 MG PO TABS: 600.0000 mg | ORAL_TABLET | Freq: Two times a day (BID) | ORAL | Status: DC

## 2023-05-12 MED ORDER — PIPERACILLIN-TAZOBACTAM IN DEX 2-0.25 GM/50ML IV SOLN
2.2500 g | Freq: Three times a day (TID) | INTRAVENOUS | Status: DC
  Filled 2023-05-12: qty 50

## 2023-05-12 MED ORDER — VANCOMYCIN HCL 500 MG/100ML IV SOLN: 500.0000 mg | INTRAVENOUS | Status: DC

## 2023-05-12 MED ORDER — CEFAZOLIN SODIUM-DEXTROSE 2-4 GM/100ML-% IV SOLN
2.0000 g | Freq: Two times a day (BID) | INTRAVENOUS | Status: DC
  Administered 2023-05-12 – 2023-05-16 (×8): 2 g via INTRAVENOUS
  Filled 2023-05-12 (×8): qty 100

## 2023-05-12 MED ORDER — METRONIDAZOLE 500 MG/100ML IV SOLN
500.0000 mg | Freq: Two times a day (BID) | INTRAVENOUS | Status: DC
  Administered 2023-05-12 – 2023-05-16 (×8): 500 mg via INTRAVENOUS
  Filled 2023-05-12 (×8): qty 100

## 2023-05-12 MED ORDER — AMLODIPINE BESYLATE 10 MG PO TABS
10.0000 mg | ORAL_TABLET | Freq: Every day | ORAL | Status: DC
  Administered 2023-05-12 – 2023-05-16 (×5): 10 mg via ORAL
  Filled 2023-05-12 (×5): qty 1

## 2023-05-12 NOTE — Progress Notes (Signed)
Physical Therapy Treatment Patient Details Name: Alicia Rogers MRN: [id number] DOB: 07/17/1961 Today's Date: 05/12/2023   History of Present Illness Pt is a 62 y.o. 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    PT Comments  Pt is received in recliner, she is agreeable to PT session. At beginning of session, Pt reports slight B hip discomfort. Pt performs bed mobility sup A, transfers and amb CGA for safety. Pt able to slight increase amb distance to 15 ft using RW although required frequent standing breaks and slight R knee buckling noted. Pt refused further BLE exercises at this time due to not wanting to "make hips hurt". Encouraged Pt to participate in BLE exercises but not successful. Overall, Pt is demonstrating steady progression towards PT goals. Pt would benefit from cont skilled PT to address above deficits and promote optimal return to PLOF.   If plan is discharge home, recommend the following: A little help with walking and/or transfers;A little help with bathing/dressing/bathroom;Assist for transportation;Help with stairs or ramp for entrance;Supervision due to cognitive status   Can travel by private vehicle     No  Equipment Recommendations  Other (comment) (TBD at next facility)    Recommendations for Other Services       Precautions / Restrictions Precautions Precautions: Fall Precaution Comments: B hip wounds Restrictions Weight Bearing Restrictions: No     Mobility  Bed Mobility Overal bed mobility: Needs  Assistance Bed Mobility: Sit to Supine       Sit to supine: Supervision   General bed mobility comments: able to perform bed mobility sup A with occasional cuing for BLE management    Transfers Overall transfer level: Needs assistance Equipment used: Rolling walker (2 wheels) Transfers: Sit to/from Stand, Bed to chair/wheelchair/BSC Sit to Stand: Contact guard assist   Step pivot transfers: Contact guard assist       General transfer comment: increased time for processing commands and initiating tasks; performs 2x STS from recliner and bed    Ambulation/Gait Ambulation/Gait assistance: Contact guard assist Gait Distance (Feet): 15 Feet Assistive device: Rolling walker (2 wheels) Gait Pattern/deviations: Step-to pattern, Decreased step length - right, Decreased step length - left, Decreased stride length, Shuffle, Antalgic Gait velocity: decreased     General Gait Details: able to amb approx 15 ft using RW with frequent standing breaks; slight R knee buckling noted once   Stairs             Wheelchair Mobility     Tilt Bed    Modified Rankin (Stroke Patients Only)       Balance Overall balance assessment: Needs assistance Sitting-balance support: No upper extremity supported, Feet supported Sitting balance-Leahy Scale: Good Sitting balance - Comments: Able to maintain seated edge of seat balance during functional activities   Standing balance support: Reliant on assistive device for balance, During functional activity, Bilateral upper extremity supported Standing balance-Leahy Scale: Good Standing balance comment: able to maintain static standing balance using RW during functional activities  Cognition Arousal: Alert Behavior During Therapy: Flat affect Overall Cognitive Status: No family/caregiver present to determine baseline cognitive functioning                                 General Comments:  increased time to process and respond to questions; flat affect and very soft spoken        Exercises Other Exercises Other Exercises: Toileting; close sup A for peri-care    General Comments General comments (skin integrity, edema, etc.): bilat hip ulcers covered throughout session      Pertinent Vitals/Pain Pain Assessment Pain Assessment: Faces Faces Pain Scale: Hurts a little bit Pain Location: bilat hips Pain Descriptors / Indicators: Guarding, Discomfort Pain Intervention(s): Limited activity within patient's tolerance, Monitored during session, Repositioned    Home Living                          Prior Function            PT Goals (current goals can now be found in the care plan section) Acute Rehab PT Goals Patient Stated Goal: to get better PT Goal Formulation: With patient Time For Goal Achievement: 05/23/23 Potential to Achieve Goals: Good Progress towards PT goals: Progressing toward goals    Frequency    Min 1X/week      PT Plan      Co-evaluation              AM-PAC PT "6 Clicks" Mobility   Outcome Measure  Help needed turning from your back to your side while in a flat bed without using bedrails?: None Help needed moving from lying on your back to sitting on the side of a flat bed without using bedrails?: None Help needed moving to and from a bed to a chair (including a wheelchair)?: A Little Help needed standing up from a chair using your arms (e.g., wheelchair or bedside chair)?: A Little Help needed to walk in hospital room?: A Little Help needed climbing 3-5 steps with a railing? : A Lot 6 Click Score: 19    End of Session   Activity Tolerance: Patient limited by fatigue Patient left: with call bell/phone within reach;in bed Nurse Communication: Mobility status PT Visit Diagnosis: Unsteadiness on feet (R26.81);Muscle weakness (generalized) (M62.81);Pain Pain - Right/Left: Left Pain - part of body: Hip     Time:  8756-4332 PT Time Calculation (min) (ACUTE ONLY): 20 min  Charges:                            Elmon Else, SPT    Harshil Cavallaro 05/12/2023, 3:25 PM

## 2023-05-12 NOTE — Progress Notes (Signed)
Pharmacy Antibiotic Note  Alicia Rogers is a 62 y.o. female admitted on 05/07/2023 with  wound infection .  Pharmacy has been consulted for vancomycin and zosyn dosing.  Plan: Day 4 of antibiotics Change vancomycin IV 750 mg IV Q48H to vancomycin 500 mg IV Q36H. Goal AUC 400-550. Expected AUC: 522.1 Expected Css min: 16.1 SCr used: 3.15 Weight used: IBW, Vd used: 0.72 (BMI 21.53) Continue Zosyn 3.375 g IV Q8H Monitor vancomycin levels as clinically indicated Continue to monitor renal function and follow culture results/susceptibilities    Height: 5\' 2"  (157.5 cm) Weight: 54 kg (119 lb 0.8 oz) IBW/kg (Calculated) : 50.1  Temp (24hrs), Avg:97.8 F (36.6 C), Min:97.6 F (36.4 C), Max:98 F (36.7 C)  Recent Labs  Lab 05/07/23 1619 05/07/23 1750 05/08/23 0412 05/09/23 0819 05/10/23 0838 05/12/23 0519  WBC 14.6*  --  13.3* 15.7* 13.3* 9.1  CREATININE 2.85*  --  2.65* 2.70* 2.80* 3.15*  LATICACIDVEN  --  1.1  --   --   --   --     Estimated Creatinine Clearance: 14.5 mL/min (A) (by C-G formula based on SCr of 3.15 mg/dL (H)).    Allergies  Allergen Reactions   Penicillins     Rash   Lisinopril     Antimicrobials this admission: 10/7 Vanc >>  10/7 Zosyn >>   Microbiology results: 10/7 Left hip cx: moderate GNR, moderate GPC in pairs--> culture re-incubated for better growth--> Abundant staph aureus, abundant strep mitis/oralis; susceptibilities to follow  Thank you for allowing pharmacy to be a part of this patient's care.  Merryl Hacker, PharmD Clinical Pharmacist 05/12/2023 7:51 AM

## 2023-05-12 NOTE — Progress Notes (Signed)
Occupational Therapy Treatment Patient Details Name: Alicia Rogers MRN: [id number] DOB: 1961-01-28 Today's Date: 05/12/2023   History of present illness Pt is a 62 y.o. 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   OT comments  Pt. participated in, and tolerated PROM to the left hand 3rd through 5th digit flexion contracture. Pt. Left hand palmar skin integrity has been compromised from the long 5th digit fingernail. Pt. Presents with a deep indentation in the left palm where the 5th digit rests on the palm. Nursing was notified about this. Pt. was provided with a palm protector for the left hand. Pt. Demonstrated independence with donning, and doffing the palm protector. Recommended wearing  with periodic removal reviewed with the Pt. Pt. education was provided to the Pt./nursing about keeping fingernails clipped. Pt. Education was provided about PROM, and additional options for keeping the 5th digit away from the palm.  Pt. Continues to benefit from OT services for ADL training, A/E training, and pt. Education about home modification, and DME.       If plan is discharge home, recommend the following:  A little help with walking and/or transfers;A lot of help with bathing/dressing/bathroom;Assistance with cooking/housework;Direct supervision/assist for medications management;Assist for transportation;Supervision due to cognitive status;Help with stairs or ramp for entrance   Equipment Recommendations       Recommendations for Other Services       Precautions / Restrictions Precautions Precautions: Fall Precaution Comments: B hip wounds Restrictions Weight Bearing Restrictions: No       Mobility Bed Mobility    Pt. Up in chair with nursing upon arrival.                 Transfers                         Balance                                           ADL either performed or assessed with clinical judgement   ADL Overall ADL's : Needs assistance/impaired                  Upper Body Dressing : Minimal assistance   Lower Body Dressing: Maximal assistance                      Extremity/Trunk Assessment Upper Extremity Assessment Upper Extremity Assessment: Generalized weakness LUE Deficits / Details: Left hand digit 3rd-5th digit flexion contracture            Vision Patient Visual Report: No change from baseline     Perception     Praxis Praxis Praxis: Impaired Praxis Impairment Details: Motor planning;Initiation    Cognition Arousal: Alert Behavior During Therapy: Flat affect Overall Cognitive Status: No family/caregiver present to determine baseline cognitive functioning  Exercises      Shoulder Instructions       General Comments      Pertinent Vitals/ Pain       Pain Assessment Pain Assessment: No/denies pain Pain Score: 0-No pain  Home Living                                          Prior Functioning/Environment              Frequency  Min 1X/week        Progress Toward Goals  OT Goals(current goals can now be found in the care plan section)  Progress towards OT goals: Progressing toward goals  Acute Rehab OT Goals Patient Stated Goal: To return home OT Goal Formulation: With patient Time For Goal Achievement: 05/23/23 Potential to Achieve Goals: Fair  Plan      Co-evaluation                 AM-PAC OT "6 Clicks" Daily Activity      Outcome Measure   Help from another person eating meals?: A Little Help from another person taking care of personal grooming?: A Lot Help from another person toileting, which includes using toliet, bedpan, or urinal?: A Lot Help from another person bathing (including washing, rinsing, drying)?: A Lot Help from another person to put on and taking off regular upper body clothing?: A Lot Help from another person to put on and taking off regular lower body clothing?: Total 6 Click Score: 12    End of Session Equipment Utilized During Treatment:  Acupuncturist)  OT Visit Diagnosis: Unsteadiness on feet (R26.81);Muscle weakness (generalized) (M62.81);Pain Pain - Right/Left: Left Pain - part of body: Hip   Activity Tolerance Patient tolerated treatment well   Patient Left in chair;with call bell/phone within reach;with chair alarm set   Nurse Communication          Time: 0865-7846 OT Time Calculation (min): 22 min  Charges: OT General Charges $OT Visit: 1 Visit OT Treatments $Self Care/Home Management : 8-22 mins  Olegario Messier, MS, OTR/L   Olegario Messier 05/12/2023, 2:15 PM

## 2023-05-12 NOTE — TOC Progression Note (Signed)
Transition of Care [facility]) - Progression Note    Patient Details  Name: Alicia Rogers MRN: [id number] Date of Birth: 22-Aug-1960  Transition [facility] Care [facility]) CM/SW Contact  Allena Katz, LCSW Phone Number: 05/12/2023, 1:14 PM  Clinical Narrative:   CSW spoke with pt who states [facility]. [name] with [facility] contacted.          Expected Discharge Plan and Services                                               Social Determinants of Health (SDOH) Interventions SDOH Screenings   Food Insecurity: No Food Insecurity (03/04/2023)   Received from [facility]  Housing: Patient Declined (08/30/2022)  Transportation Needs: No Transportation Needs (03/04/2023)   Received from [facility]  Utilities: Not At Risk (03/04/2023)   Received from [facility]  Alcohol Screen: Low Risk  (09/29/2021)  Financial Resource Strain: Medium Risk (03/04/2023)   Received from [facility]  Social Connections: Unknown (09/28/2022)   Received from [facility]  Tobacco Use: High Risk (05/07/2023)    Readmission Risk Interventions    05/10/2023   10:05 AM  Readmission Risk Prevention Plan  Transportation Screening Complete  HRI or Home Care Consult Complete  Palliative Care Screening Complete  Medication Review (RN Care Manager) Complete

## 2023-05-12 NOTE — Progress Notes (Signed)
[facility] Progress Note  Patient: Alicia Rogers    ZOX:[id number]  DOA: 05/07/2023     Date of Service: the patient was seen and examined on 05/12/2023  Chief Complaint  Patient presents with   Leg Swelling   Brief hospital course: 62 y.o. 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  Patient who ambulates with a walker at  baseline. has had no orthopnea or shortness of breath no complaints of chest pain.  Patient was last seen by her nephrologist on 04/07/23 when she was noted to have lower extremity edema and her torsemide was replaced with furosemide at that time. Denies cough, fever or chills or leg pain. Endorses dysuria/pain with urination.  Of note, during hospitalization in January 2024, patient was noted to have a pulmonary nodule which showed complete CT resolution by the time of pulmonology follow-up 03/2023.  Pulmonologist did express some concern for aspiration for which barium swallow was ordered, (not performed) for ongoing cough however patient currently denies  cough    Seen in consultation by nephrology   WOC involved for evaluation of bilateral hip wounds.  Noted to have unstageable left hip/trochanter wound with foul-smelling tan-colored drainage.  Recommend surgical evaluation.  Surgical consult requested.    Assessment and Plan: # Anasarca, Resolved  Stage V CKD with nephrotic range proteinuria Small bilateral pleural effusions Suspect anasarca is renal and not cardiac BNP elevated over 400  Urine output poor No  hydronephrosis noted on ultrasound Plan: No indication for dialysis acutely S/p IV Lasix 60 mg twice daily, transitioned to Lasix 40 mg p.o. daily On 10/8 10/10 creatinine slightly elevated, discontinued Lasix but patient had received a dose today Salt and fluid restrict Hold nephrotoxins TTE LVEF 60-65%, no wall motion abnormality.   Acute urinary retention Following admission, patient could not produce a urine specimen and In-N-Out cath yielded 800 mL.  Significant for glucosuria and significant proteinuria Plan: Flomax 0.4 mg daily ordered   Leukocytosis Possible respiratory tract infection versus urinary tract infection Hypoxia/acute respiratory failure with hypoxia Full-thickness left hip wound with drainage WBC 14,000, with low-grade temp of 99.1 on arrival, with mild hypoxia to 88% in the ED requiring O2 at 2 L.  WOC noted left hip wound with foul-smelling drainage Chest x-ray showing hazy airspace disease at bases Urinalysis bland MRI hip no evidence of osteo Plan: Wound culture growing MSSA and strep mitis/oralis S/p vancomycin and Zosyn, 10/10 started cefazolin and Flagyl as per sensitivity report Pharmacy was consulted for antibiotics Appreciate surgical follow-up.  No operating room debridement indicated at this time   Iron deficiency anemia Anemia of CKD stage V 10/9 Hemoglobin 9.5 stable Continue to monitor and transfuse if necessary Currently no indication for transfusion Monitor and transfuse as necessary.   Goal hemoglobin greater than 7    Chronic obstructive pulmonary disease Not acutely exacerbated Continue home inhalers DuoNebs as needed   Schizoaffective disorder, bipolar type Continue home meds   Essential hypertension Suboptimal control Continue losartan 100 mg pod, hydralazine 50 mg po TID S/p amlodipine 10 mg daily, d/c'd on 10/9 and started clonidine 0.2 mg p.o. twice  daily S/p Lasix 40 mg p.o. daily x 3 days, d/c'd on 10/10 after am dose   10/10 still has high blood pressure and bradycardia, discontinued Cardizem CD 360 mg home dose,  Started amlodipine 10 mg p.o. daily and changed to clonidine 0.1 mg p.o. 3 times daily with holding parameters We will continue to monitor BP and titrate medications accordingly  Diabetes mellitus, type 2 Sliding scale insulin coverage   Tobacco use disorder Nicotine patch   Body mass index is 21.77 kg/m.  Interventions:  Pressure Injury 05/08/23 Thigh Anterior;Left;Proximal Unstageable - Full thickness tissue loss in which the base of the injury is covered by slough (yellow, tan, gray, green or brown) and/or eschar (tan, brown or black) in the wound bed. slough covered w (Active)  05/08/23 1620  Location: Thigh  Location Orientation: Anterior;Left;Proximal  Staging: Unstageable - Full thickness tissue loss in which the base of the injury is covered by slough (yellow, tan, gray, green or brown) and/or eschar (tan, brown or black) in the wound bed.  Wound Description (Comments): slough covered wound  Present on Admission: Yes  Dressing Type Foam - Lift dressing to assess site every shift 05/12/23 0830     Diet: Regular diet DVT Prophylaxis: Subcutaneous Heparin    Advance goals of care discussion: Full code  Family Communication: family was not present at bedside, at the time of interview.  The pt provided permission to discuss medical plan with the family. Opportunity was given to ask question and all questions were answered satisfactorily.   Disposition:  Pt is from group home, admitted with anasarca, [name], left hip ulcer, s/p debridement still on IV antibiotics, which precludes a safe discharge. Discharge to SNF in 1 to 2 days Follow TOC for disposition plan   Subjective: No significant events overnight, patient was resting comfortably, sitting on the recliner.  Denied any active issues, no chest pain or palpitation no shortness of breath.  Physical Exam: General: NAD, lying  comfortably Appear in no distress, affect appropriate Eyes: PERRLA ENT: Oral Mucosa Clear, moist  Neck: no JVD,  Cardiovascular: S1 and S2 Present, no Murmur,  Respiratory: good respiratory effort, Bilateral Air entry equal and Decreased, no Crackles, no wheezes Abdomen: Bowel Sound present, Soft and no tenderness,  Skin: Left Hip ulcer status post debridement, dressing CDI Extremities: no Pedal edema, no calf tenderness Neurologic: without any new focal findings Gait not checked due to patient safety concerns  Vitals:   05/11/23 2031 05/12/23 0622 05/12/23 0756 05/12/23 0800  BP: (!) 166/64 (!) 170/62 (!) 175/66   Pulse: (!) 54 (!) 52 (!) 53 60  Resp: 17 18 19    Temp: 97.6 F (36.4 C) 98 F (36.7 C) 98.6 F (37 C)   TempSrc:      SpO2: 100% 100% 96%   Weight:      Height:        Intake/Output Summary (Last 24 hours) at 05/12/2023 1357 Last data filed at 05/12/2023 0607 Gross per 24 hour  Intake 333.01 ml  Output 650 ml  Net -316.99 ml   Filed Weights   05/07/23 1618 05/09/23 0500 05/10/23 0433  Weight: 49 kg 53.4 kg 54 kg    Data Reviewed: I have personally reviewed and interpreted daily labs, tele strips, imagings as discussed above. I reviewed all nursing notes, pharmacy notes, vitals, pertinent old records I have discussed plan of care as described above with RN and patient/family.  CBC: Recent Labs  Lab 05/07/23 1619 05/08/23 0412 05/09/23  9604 05/10/23 0838 05/12/23 0519  WBC 14.6* 13.3* 15.7* 13.3* 9.1  NEUTROABS 10.6*  --  12.0* 10.7*  --   HGB 8.4* 8.3* 9.1* 9.5* 8.7*  HCT 27.3* 27.9* 29.5* 30.3* 27.8*  MCV 93.2 93.3 91.9 90.7 89.7  PLT 458* 480* 541* 578* 594*   Basic Metabolic Panel: Recent Labs  Lab 05/07/23 1619 05/08/23 0412 05/09/23 0819 05/10/23 0838 05/12/23 0519  NA 138 140 140 138 138  K 3.2* 3.6 4.9 4.6 4.3  CL 99 102 102 100 98  CO2 27 27 27 24 27   GLUCOSE 158* 249* 159* 176* 150*  BUN 33* 31* 30* 34* 42*  CREATININE 2.85*  2.65* 2.70* 2.80* 3.15*  CALCIUM 8.5* 8.5* 9.2 9.1 8.9  MG  --   --  2.0  --  2.4  PHOS  --   --   --   --  5.1*    Studies: No results found.  Scheduled Meds:  amLODipine  10 mg Oral Daily   vitamin C  500 mg Oral BID   aspirin EC  81 mg Oral Daily   buPROPion ER  100 mg Oral Daily   calcitRIOL  0.25 mcg Oral Daily   cloNIDine  0.1 mg Oral TID   escitalopram  20 mg Oral Daily   feeding supplement  237 mL Oral BID BM   fluticasone furoate-vilanterol  1 puff Inhalation Daily   And   umeclidinium bromide  1 puff Inhalation Daily   heparin  5,000 Units Subcutaneous Q8H   hydrALAZINE  50 mg Oral TID   insulin aspart  0-5 Units Subcutaneous QHS   insulin aspart  0-9 Units Subcutaneous TID WC   losartan  100 mg Oral Daily   melatonin  5 mg Oral QHS   multivitamin with minerals  1 tablet Oral Daily   nicotine  21 mg Transdermal Daily   potassium chloride  40 mEq Oral Daily   sodium hypochlorite   Irrigation BID   tamsulosin  0.4 mg Oral Daily   zinc sulfate  220 mg Oral Daily   Continuous Infusions:   ceFAZolin (ANCEF) IV     metronidazole     PRN Meds: acetaminophen **OR** acetaminophen, albuterol, guaiFENesin-dextromethorphan, labetalol, lip balm, ondansetron **OR** ondansetron (ZOFRAN) IV, traMADol  Time spent: 35 minutes  Author: Gillis Santa. MD [facility] 05/12/2023 1:57 PM  To reach On-call, see care teams to locate the attending and reach out to them via [email or web address]. If 7PM-7AM, please contact night-coverage If you still have difficulty reaching the attending provider, please page the [facility] (Director on Call) for [facility] on amion for assistance.

## 2023-05-12 NOTE — TOC Progression Note (Signed)
Transition of Care [facility]) - Progression Note    Patient Details  Name: Alicia Rogers MRN: [id number] Date of Birth: 12-18-1960  [facility]) CM/SW Contact  Allena Katz, LCSW Phone Number: 05/12/2023, 3:11 PM  Clinical Narrative:   Glennon Mac.  [phone] [id number]          Expected Discharge Plan and Services                                               Social Determinants of Health (SDOH) Interventions SDOH Screenings   Food Insecurity: No Food Insecurity (03/04/2023)   Received from [facility]  Housing: Patient Declined (08/30/2022)  Transportation Needs: No Transportation Needs (03/04/2023)   Received from [facility]  Utilities: Not At Risk (03/04/2023)   Received from [facility]  Alcohol Screen: Low Risk  (09/29/2021)  Financial Resource Strain: Medium Risk (03/04/2023)   Received from [facility]  Social Connections: Unknown (09/28/2022)   Received from [facility]  Tobacco Use: High Risk (05/07/2023)    Readmission Risk Interventions    05/10/2023   10:05 AM  Readmission Risk Prevention Plan  Transportation Screening Complete  HRI or Home Care Consult Complete  Palliative Care Screening Complete  Medication Review (RN Care Manager) Complete

## 2023-05-12 NOTE — Progress Notes (Signed)
Palliative Care Progress Note, Assessment & Plan   Patient Name: Alicia Rogers       Date: 05/12/2023 DOB: 03-07-1961  Age: 62 y.o. MRN#: [id number] Attending Physician: Gillis Santa, MD Primary Care Physician: Patient, No Pcp Per Admit Date: 05/07/2023  Subjective: Patient is out of bed and sitting in recliner.  She acknowledges my presence and is able to make her wishes known.  She has no acute complaints at this time.  No family or friends are present during my visit.  HPI: 61 y.o. female  with past medical history of type 2 diabetes, CKD, MGUS, schizoaffective disorder, HTN, secondary hyperparathyroidism, and hip wounds admitted from a group home on 05/07/2023 with fluid retention/lower extremity edema.   Patient is being treated for anasarca, [name], and acute on chronic CHF.  Additionally, her hip wound was debrided at bedside.  Patient remains on antibiotics with no current need for surgical debridement.   PMT was consulted to discuss goals of care.  Summary of counseling/coordination of care: Extensive chart review completed prior to meeting patient including labs, vital signs, imaging, progress notes, orders, and available advanced directive documents from current and previous encounters.   After reviewing the patient's chart and assessing the patient at bedside, I discussed symptom management and goals of care with patient.  Symptoms assessed.  Patient denies pain, constipation, nausea, vomiting, headache, and chest pain at this time.  No acute complaints.  No adjustment to [facility] needed.  I attempted to elicit values and goals important to the patient.  She shares she is read over the advance directive paperwork but does not wish to completed at this time.  I offered to answer questions or  concerns related to CODE STATUS, advanced directives, or current plan of care.  Patient shares she does not have any questions or concerns at this time.  I again highlighted that patient remains a full code, accepting of all offered available and appropriate medical interventions to sustain her life.  Patient confirmed this.  Full code remains.  We discussed that next steps for patient involve disposition planning.  TOC following closely for discharge planning.  Patient shares she is not sure where she is going to go but but she might be able to go to a rehab facility for therapies.  In light of this information, I introduced the concept of a MOST form.  I outlined that is important to make patient's wishes known in advance of an emergency or urgent situation.  Discussed that the MOST form is a more detailed format for her to be able to make her wishes known beyond her advance directives.  Copy of paperwork given to patient.  Patient shares she will review it at a later time.  I again offered to speak with patient's sister in regards to PMT's role on her medical team.  Patient politely declined and shared that she will speak with her sister at a later time.  PMT will continue to follow and support patient throughout her hospitalizatio  Physical Exam Vitals reviewed.  Constitutional:      General: She is not in acute distress.    Appearance: She is normal weight.  HENT:  Head: Normocephalic.     Mouth/Throat:     Mouth: Mucous membranes are moist.  Eyes:     Pupils: Pupils are equal, round, and reactive to light.  Pulmonary:     Effort: Pulmonary effort is normal.  Abdominal:     Palpations: Abdomen is soft.  Skin:    General: Skin is warm and dry.     Comments: UTA hip wounds give patient's positioning  Neurological:     Mental Status: She is alert and oriented to person, place, and time.  Psychiatric:        Mood and Affect: Mood normal.        Behavior: Behavior normal.         Thought Content: Thought content normal.        Judgment: Judgment normal.             Total Time 35 minutes   Time spent includes: Detailed review of medical records (labs, imaging, vital signs), medically appropriate exam (mental status, respiratory, cardiac, skin), discussed with treatment team, counseling and educating patient, family and staff, documenting clinical information, medication management and coordination of care.  Samara Deist L. Bonita Quin, DNP, FNP-BC Palliative Medicine Team

## 2023-05-13 DIAGNOSIS — Z515 Encounter for palliative care: Secondary | ICD-10-CM | POA: Diagnosis not present

## 2023-05-13 DIAGNOSIS — I509 Heart failure, unspecified: Secondary | ICD-10-CM | POA: Diagnosis not present

## 2023-05-13 DIAGNOSIS — F250 Schizoaffective disorder, bipolar type: Secondary | ICD-10-CM | POA: Diagnosis not present

## 2023-05-13 DIAGNOSIS — R601 Generalized edema: Secondary | ICD-10-CM | POA: Diagnosis not present

## 2023-05-13 LAB — CBC
HCT: 29.2 % — ABNORMAL LOW (ref 36.0–46.0)
Hemoglobin: 8.8 g/dL — ABNORMAL LOW (ref 12.0–15.0)
MCH: 28.4 pg (ref 26.0–34.0)
MCHC: 30.1 g/dL (ref 30.0–36.0)
MCV: 94.2 fL (ref 80.0–100.0)
Platelets: 639 10*3/uL — ABNORMAL HIGH (ref 150–400)
RBC: 3.1 MIL/uL — ABNORMAL LOW (ref 3.87–5.11)
RDW: 14.7 % (ref 11.5–15.5)
WBC: 11 10*3/uL — ABNORMAL HIGH (ref 4.0–10.5)
nRBC: 0.2 % (ref 0.0–0.2)

## 2023-05-13 LAB — BASIC METABOLIC PANEL
Anion gap: 13 (ref 5–15)
BUN: 46 mg/dL — ABNORMAL HIGH (ref 8–23)
CO2: 26 mmol/L (ref 22–32)
Calcium: 9.2 mg/dL (ref 8.9–10.3)
Chloride: 101 mmol/L (ref 98–111)
Creatinine, Ser: 3.38 mg/dL — ABNORMAL HIGH (ref 0.44–1.00)
GFR, Estimated: 15 mL/min — ABNORMAL LOW (ref >60)
Glucose, Bld: 138 mg/dL — ABNORMAL HIGH (ref 70–99)
Potassium: 4.2 mmol/L (ref 3.5–5.1)
Sodium: 140 mmol/L (ref 135–145)

## 2023-05-13 LAB — PHOSPHORUS: Phosphorus: 4.2 mg/dL (ref 2.5–4.6)

## 2023-05-13 LAB — MAGNESIUM: Magnesium: 2.6 mg/dL — ABNORMAL HIGH (ref 1.7–2.4)

## 2023-05-13 LAB — GLUCOSE, CAPILLARY
Glucose-Capillary: 140 mg/dL — ABNORMAL HIGH (ref 70–99)
Glucose-Capillary: 148 mg/dL — ABNORMAL HIGH (ref 70–99)
Glucose-Capillary: 134 mg/dL — ABNORMAL HIGH (ref 70–99)
Glucose-Capillary: 104 mg/dL — ABNORMAL HIGH (ref 70–99)

## 2023-05-13 MED ORDER — FUROSEMIDE 20 MG PO TABS
20.0000 mg | ORAL_TABLET | ORAL | Status: DC
  Administered 2023-05-13 – 2023-05-15 (×2): 20 mg via ORAL
  Filled 2023-05-13 (×2): qty 1

## 2023-05-13 NOTE — Progress Notes (Addendum)
Mobility Specialist - Progress Note     05/13/23 1550  Mobility  Activity Ambulated with assistance in hallway  Level of Assistance Contact guard assist, steadying assist  Assistive Device Front wheel walker  Distance Ambulated (ft) 40 ft  Range of Motion/Exercises Active  Activity Response Tolerated well  Mobility Referral Yes  $Mobility charge 1 Mobility  Mobility Specialist Start Time (ACUTE ONLY) 1423  Mobility Specialist Stop Time (ACUTE ONLY) 1500  Mobility Specialist Time Calculation (min) (ACUTE ONLY) 37 min   Pt resting in recliner on RA upon entry. Pt STS ambulates to wheel chair (87ft) before being rolled outsite to healing garden. Pt returned to unit and ambulates (20 ft) back to recliner and left with needs in reach, bed alarm activated.   Johnathan Hausen Mobility Specialist 05/13/23, 4:09 PM

## 2023-05-13 NOTE — Progress Notes (Signed)
[facility] Progress Note  Patient: Alicia Rogers    GNF:[id number]  DOA: 05/07/2023     Date of Service: the patient was seen and examined on 05/13/2023  Chief Complaint  Patient presents with   Leg Swelling   Brief hospital course: 62 y.o. 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  Patient who ambulates with a walker at  baseline. has had no orthopnea or shortness of breath no complaints of chest pain.  Patient was last seen by her nephrologist on 04/07/23 when she was noted to have lower extremity edema and her torsemide was replaced with furosemide at that time. Denies cough, fever or chills or leg pain. Endorses dysuria/pain with urination.  Of note, during hospitalization in January 2024, patient was noted to have a pulmonary nodule which showed complete CT resolution by the time of pulmonology follow-up 03/2023.  Pulmonologist did express some concern for aspiration for which barium swallow was ordered, (not performed) for ongoing cough however patient currently denies  cough    Seen in consultation by nephrology   WOC involved for evaluation of bilateral hip wounds.  Noted to have unstageable left hip/trochanter wound with foul-smelling tan-colored drainage.  Recommend surgical evaluation.  Surgical consult requested.    Assessment and Plan: # Anasarca, Resolved  Stage V CKD with nephrotic range proteinuria Small bilateral pleural effusions Suspect anasarca is renal and not cardiac BNP elevated over 400  Urine output poor No  hydronephrosis noted on ultrasound Plan: No indication for dialysis acutely S/p IV Lasix 60 mg twice daily, transitioned to Lasix 40 mg p.o. daily On 10/8 10/10 creatinine slightly elevated, discontinued Lasix but patient had received a dose today Salt and fluid restrict Hold nephrotoxins TTE LVEF 60-65%, no wall motion abnormality. 10/11 started Lasix 20 mg every other day as per nephrology  Acute urinary retention Following admission, patient could not produce a urine specimen and In-N-Out cath yielded 800 mL.  Significant for glucosuria and significant proteinuria Plan: Flomax 0.4 mg daily ordered   Leukocytosis Possible respiratory tract infection versus urinary tract infection Hypoxia/acute respiratory failure with hypoxia Full-thickness left hip wound with drainage WBC 14,000, with low-grade temp of 99.1 on arrival, with mild hypoxia to 88% in the ED requiring O2 at 2 L.  WOC noted left hip wound with foul-smelling drainage Chest x-ray showing hazy airspace disease at bases Urinalysis bland MRI hip no evidence of osteo Plan: Wound culture growing MSSA and strep mitis/oralis S/p vancomycin and Zosyn, 10/10 started cefazolin and Flagyl as per sensitivity report Pharmacy was consulted for antibiotics Appreciate surgical follow-up.  No operating room debridement indicated at this time   Iron deficiency anemia Anemia of CKD stage V 10/11 Hemoglobin 8.8 slightly low Continue to monitor and transfuse if necessary Currently no indication for transfusion Monitor and transfuse as necessary.   Goal hemoglobin greater than 7    Chronic obstructive pulmonary disease Not acutely exacerbated Continue home inhalers DuoNebs as needed   Schizoaffective disorder, bipolar type Continue home meds   Essential hypertension Suboptimal control Continue losartan 100 mg pod, hydralazine 50 mg po TID S/p amlodipine 10 mg  daily, d/c'd on 10/9 and started clonidine 0.2 mg p.o. twice  daily S/p Lasix 40 mg p.o. daily x 3 days, d/c'd on 10/10 after am dose  10/10 still has high blood pressure and bradycardia, discontinued Cardizem CD 360 mg home dose,  Started amlodipine 10 mg p.o. daily and changed to clonidine 0.1 mg p.o. 3 times daily with holding parameters We will continue to monitor BP and titrate medications accordingly  Diabetes mellitus, type 2 Sliding scale insulin coverage   Tobacco use disorder Nicotine patch   Body mass index is 21.77 kg/m.  Interventions:  Pressure Injury 05/08/23 Thigh Anterior;Left;Proximal Unstageable - Full thickness tissue loss in which the base of the injury is covered by slough (yellow, tan, gray, green or brown) and/or eschar (tan, brown or black) in the wound bed. slough covered w (Active)  05/08/23 1620  Location: Thigh  Location Orientation: Anterior;Left;Proximal  Staging: Unstageable - Full thickness tissue loss in which the base of the injury is covered by slough (yellow, tan, gray, green or brown) and/or eschar (tan, brown or black) in the wound bed.  Wound Description (Comments): slough covered wound  Present on Admission: Yes  Dressing Type Foam - Lift dressing to assess site every shift 05/13/23 0800     Diet: Regular diet DVT Prophylaxis: Subcutaneous Heparin    Advance goals of care discussion: Full code  Family Communication: family was not present at bedside, at the time of interview.  The pt provided permission to discuss medical plan with the family. Opportunity was given to ask question and all questions were answered satisfactorily.   Disposition:  Pt is from group home, admitted with anasarca, [name], left hip ulcer, s/p debridement still on IV antibiotics, and elevated creatinine, which precludes a safe discharge. Discharge to SNF in 1 to 2 days Follow TOC for disposition plan   Subjective: No significant events overnight, patient was sitting comfortably on the recliner, stated that she is feeling  so-so, she is able to walk to the bathroom by herself with assistance.  Denied any complaints.   Physical Exam: General: NAD, lying comfortably Appear in no distress, affect appropriate Eyes: PERRLA ENT: Oral Mucosa Clear, moist  Neck: no JVD,  Cardiovascular: S1 and S2 Present, no Murmur,  Respiratory: good respiratory effort, Bilateral Air entry equal and Decreased, no Crackles, no wheezes Abdomen: Bowel Sound present, Soft and no tenderness,  Skin: Left Hip ulcer status post debridement, dressing CDI Extremities: no Pedal edema, no calf tenderness Neurologic: without any new focal findings Gait not checked due to patient safety concerns  Vitals:   05/12/23 2017 05/13/23 0559 05/13/23 0812 05/13/23 1304  BP: (!) 133/57 (!) 173/99 (!) 164/70 (!) 142/61  Pulse: (!) 55 69 71   Resp: 20 18    Temp: 97.7 F (36.5 C) 98.5 F (36.9 C) 97.7 F (36.5 C)   TempSrc:   Oral   SpO2: 100% 100% 100%   Weight:      Height:        Intake/Output Summary (Last 24 hours) at 05/13/2023 1325 Last data filed at 05/13/2023 0601 Gross per 24 hour  Intake 200.86 ml  Output --  Net 200.86 ml   Filed Weights   05/07/23 1618 05/09/23 0500 05/10/23 0433  Weight: 49 kg 53.4 kg 54 kg    Data Reviewed: I have personally reviewed and interpreted daily labs, tele strips, imagings as discussed above. I reviewed all nursing notes, pharmacy notes, vitals, pertinent old records I have discussed plan  of care as described above with RN and patient/family.  CBC: Recent Labs  Lab 05/07/23 1619 05/08/23 0412 05/09/23 0819 05/10/23 0838 05/12/23 0519 05/13/23 0358  WBC 14.6* 13.3* 15.7* 13.3* 9.1 11.0*  NEUTROABS 10.6*  --  12.0* 10.7*  --   --   HGB 8.4* 8.3* 9.1* 9.5* 8.7* 8.8*  HCT 27.3* 27.9* 29.5* 30.3* 27.8* 29.2*  MCV 93.2 93.3 91.9 90.7 89.7 94.2  PLT 458* 480* 541* 578* 594* 639*   Basic Metabolic Panel: Recent Labs  Lab 05/08/23 0412 05/09/23 0819 05/10/23 0838 05/12/23 0519  05/13/23 0358  NA 140 140 138 138 140  K 3.6 4.9 4.6 4.3 4.2  CL 102 102 100 98 101  CO2 27 27 24 27 26   GLUCOSE 249* 159* 176* 150* 138*  BUN 31* 30* 34* 42* 46*  CREATININE 2.65* 2.70* 2.80* 3.15* 3.38*  CALCIUM 8.5* 9.2 9.1 8.9 9.2  MG  --  2.0  --  2.4 2.6*  PHOS  --   --   --  5.1* 4.2    Studies: No results found.  Scheduled Meds:  amLODipine  10 mg Oral Daily   vitamin C  500 mg Oral BID   aspirin EC  81 mg Oral Daily   buPROPion ER  100 mg Oral Daily   calcitRIOL  0.25 mcg Oral Daily   cloNIDine  0.1 mg Oral TID   escitalopram  20 mg Oral Daily   feeding supplement  237 mL Oral BID BM   fluticasone furoate-vilanterol  1 puff Inhalation Daily   And   umeclidinium bromide  1 puff Inhalation Daily   furosemide  20 mg Oral QODAY   heparin  5,000 Units Subcutaneous Q8H   hydrALAZINE  50 mg Oral TID   insulin aspart  0-5 Units Subcutaneous QHS   insulin aspart  0-9 Units Subcutaneous TID WC   losartan  100 mg Oral Daily   melatonin  5 mg Oral QHS   multivitamin with minerals  1 tablet Oral Daily   nicotine  21 mg Transdermal Daily   potassium chloride  40 mEq Oral Daily   sodium hypochlorite   Irrigation BID   tamsulosin  0.4 mg Oral Daily   zinc sulfate  220 mg Oral Daily   Continuous Infusions:   ceFAZolin (ANCEF) IV 2 g (05/13/23 0854)   metronidazole 500 mg (05/13/23 1053)   PRN Meds: acetaminophen **OR** acetaminophen, albuterol, guaiFENesin-dextromethorphan, labetalol, lip balm, ondansetron **OR** ondansetron (ZOFRAN) IV, traMADol  Time spent: 35 minutes  Author: Gillis Santa. MD [facility] 05/13/2023 1:25 PM  To reach On-call, see care teams to locate the attending and reach out to them via [email or web address]. If 7PM-7AM, please contact night-coverage If you still have difficulty reaching the attending provider, please page the [facility] (Director on Call) for [facility] on amion for assistance.

## 2023-05-13 NOTE — Plan of Care (Signed)
Pt alert and oriented, denies any c/o pain. Dressing to left hip changed as ordered today, pt tolerated well. Pt has been sitting up in the chair today, bladder scan had 0 residual. POC SNF placement.  Problem: Education: Goal: Ability to demonstrate management of disease process will improve Outcome: Progressing Goal: Ability to verbalize understanding of medication therapies will improve Outcome: Progressing Goal: Individualized Educational Video(s) Outcome: Progressing   Problem: Activity: Goal: Capacity to carry out activities will improve Outcome: Progressing   Problem: Cardiac: Goal: Ability to achieve and maintain adequate cardiopulmonary perfusion will improve Outcome: Progressing   Problem: Education: Goal: Ability to describe self-care measures that may prevent or decrease complications (Diabetes Survival Skills Education) will improve Outcome: Progressing Goal: Individualized Educational Video(s) Outcome: Progressing   Problem: Coping: Goal: Ability to adjust to condition or change in health will improve Outcome: Progressing   Problem: Fluid Volume: Goal: Ability to maintain a balanced intake and output will improve Outcome: Progressing   Problem: Health Behavior/Discharge Planning: Goal: Ability to identify and utilize available resources and services will improve Outcome: Progressing Goal: Ability to manage health-related needs will improve Outcome: Progressing   Problem: Metabolic: Goal: Ability to maintain appropriate glucose levels will improve Outcome: Progressing   Problem: Nutritional: Goal: Maintenance of adequate nutrition will improve Outcome: Progressing Goal: Progress toward achieving an optimal weight will improve Outcome: Progressing   Problem: Skin Integrity: Goal: Risk for impaired skin integrity will decrease Outcome: Progressing   Problem: Tissue Perfusion: Goal: Adequacy of tissue perfusion will improve Outcome: Progressing    Problem: Education: Goal: Knowledge of General Education information will improve Description: Including pain rating scale, medication(s)/side effects and non-pharmacologic comfort measures Outcome: Progressing   Problem: Health Behavior/Discharge Planning: Goal: Ability to manage health-related needs will improve Outcome: Progressing   Problem: Clinical Measurements: Goal: Ability to maintain clinical measurements within normal limits will improve Outcome: Progressing Goal: Will remain free from infection Outcome: Progressing Goal: Diagnostic test results will improve Outcome: Progressing Goal: Respiratory complications will improve Outcome: Progressing Goal: Cardiovascular complication will be avoided Outcome: Progressing   Problem: Activity: Goal: Risk for activity intolerance will decrease Outcome: Progressing   Problem: Nutrition: Goal: Adequate nutrition will be maintained Outcome: Progressing   Problem: Coping: Goal: Level of anxiety will decrease Outcome: Progressing   Problem: Elimination: Goal: Will not experience complications related to bowel motility Outcome: Progressing Goal: Will not experience complications related to urinary retention Outcome: Progressing   Problem: Pain Managment: Goal: General experience of comfort will improve Outcome: Progressing   Problem: Safety: Goal: Ability to remain free from injury will improve Outcome: Progressing   Problem: Skin Integrity: Goal: Risk for impaired skin integrity will decrease Outcome: Progressing

## 2023-05-13 NOTE — Progress Notes (Signed)
[facility]  ROUNDING NOTE   Subjective:   [name] was admitted to [facility] on 05/07/2023 for Anasarca [R60.1] Peripheral edema [R60.0] [name] (acute kidney injury) (HCC) [N17.9] Acute hypoxemic respiratory failure (HCC) [J96.01] Acute on chronic congestive heart failure, unspecified heart failure type [facility]) [I50.9]  Patient was seen by Dr. Thedore Mins on 9/4 where she was referred for vein mapping for dialysis planning. Her diuretics were adjusted at that time.   Patient sitting up in chair Alert and oriented Tolerating small meals Denies nausea or vomiting Room air No lower extremity edema  UOP  Objective:  Vital signs in last 24 hours:  Temp:  [97.5 F (36.4 C)-98.5 F (36.9 C)] 97.7 F (36.5 C) (10/11 0812) Pulse Rate:  [53-71] 71 (10/11 0812) Resp:  [18-20] 18 (10/11 0559) BP: (133-173)/(57-99) 164/70 (10/11 0812) SpO2:  [100 %] 100 % (10/11 0812)  Weight change:  Filed Weights   05/07/23 1618 05/09/23 0500 05/10/23 0433  Weight: 49 kg 53.4 kg 54 kg    Intake/Output: I/O last 3 completed shifts: In: 533.9 [IV Piggyback:533.9] Out: 1200 [Urine:1200]   Intake/Output this shift:  No intake/output data recorded.  Physical Exam: General: NAD  Head: Normocephalic, atraumatic. Moist oral mucosal membranes  Eyes: Anicteric  Lungs:  Clear to auscultation  Heart: Regular rate and rhythm  Abdomen:  Soft, nontender  Extremities: No peripheral edema.  Neurologic: Alert, moving all four extremities  Skin: No lesions  Access: none    Basic Metabolic Panel: Recent Labs  Lab 05/08/23 0412 05/09/23 0819 05/10/23 0838 05/12/23 0519 05/13/23 0358  NA 140 140 138 138 140  K 3.6 4.9 4.6 4.3 4.2  CL 102 102 100 98 101  CO2 27 27 24 27 26   GLUCOSE 249* 159* 176* 150* 138*  BUN 31* 30* 34* 42* 46*  CREATININE 2.65* 2.70* 2.80* 3.15* 3.38*  CALCIUM 8.5* 9.2 9.1 8.9 9.2  MG  --  2.0  --  2.4 2.6*  PHOS  --   --   --  5.1* 4.2    Liver  Function Tests: Recent Labs  Lab 05/07/23 1619  AST 22  ALT 22  ALKPHOS 105  BILITOT 0.5  PROT 7.1  ALBUMIN 2.0*   No results for input(s): "LIPASE", "AMYLASE" in the last 168 hours. No results for input(s): "AMMONIA" in the last 168 hours.  CBC: Recent Labs  Lab 05/07/23 1619 05/08/23 0412 05/09/23 0819 05/10/23 0838 05/12/23 0519 05/13/23 0358  WBC 14.6* 13.3* 15.7* 13.3* 9.1 11.0*  NEUTROABS 10.6*  --  12.0* 10.7*  --   --   HGB 8.4* 8.3* 9.1* 9.5* 8.7* 8.8*  HCT 27.3* 27.9* 29.5* 30.3* 27.8* 29.2*  MCV 93.2 93.3 91.9 90.7 89.7 94.2  PLT 458* 480* 541* 578* 594* 639*    Cardiac Enzymes: No results for input(s): "CKTOTAL", "CKMB", "CKMBINDEX", "TROPONINI" in the last 168 hours.  BNP: Invalid input(s): "POCBNP"  CBG: Recent Labs  Lab 05/12/23 0756 05/12/23 1308 05/12/23 1645 05/12/23 2148 05/13/23 0754  GLUCAP 157* 212* 114* 137* 140*    Microbiology: Results for orders placed or performed during the hospital encounter of 05/07/23  Resp panel by RT-PCR (RSV, Flu A&B, Covid) Anterior Nasal Swab     Status: None   Collection Time: 05/07/23 10:26 PM   Specimen: Anterior Nasal Swab  Result Value Ref Range Status   SARS Coronavirus 2 by RT PCR NEGATIVE NEGATIVE Final    Comment: (NOTE) SARS-CoV-2 target nucleic acids are  NOT DETECTED.  The SARS-CoV-2 RNA is generally detectable in upper respiratory specimens during the acute phase of infection. The lowest concentration of SARS-CoV-2 viral copies this assay can detect is 138 copies/mL. A negative result does not preclude SARS-Cov-2 infection and should not be used as the sole basis for treatment or other patient management decisions. A negative result may occur with  improper specimen collection/handling, submission of specimen other than nasopharyngeal swab, presence of viral mutation(s) within the areas targeted by this assay, and inadequate number of viral copies(<138 copies/mL). A negative result must  be combined with clinical observations, patient history, and epidemiological information. The expected result is Negative.  Fact Sheet for Patients:  BloggerCourse.com  Fact Sheet for Healthcare Providers:  SeriousBroker.it  This test is no t yet approved or cleared by the [place] FDA and  has been authorized for detection and/or diagnosis of SARS-CoV-2 by FDA under an Emergency Use Authorization (EUA). This EUA will remain  in effect (meaning this test can be used) for the duration of the COVID-19 declaration under Section 564(b)(1) of the Act, 21 U.S.C.section 360bbb-3(b)(1), unless the authorization is terminated  or revoked sooner.       Influenza A by PCR NEGATIVE NEGATIVE Final   Influenza B by PCR NEGATIVE NEGATIVE Final    Comment: (NOTE) The Xpert Xpress SARS-CoV-2/FLU/RSV plus assay is intended as an aid in the diagnosis of influenza from Nasopharyngeal swab specimens and should not be used as a sole basis for treatment. Nasal washings and aspirates are unacceptable for Xpert Xpress SARS-CoV-2/FLU/RSV testing.  Fact Sheet for Patients: BloggerCourse.com  Fact Sheet for Healthcare Providers: SeriousBroker.it  This test is not yet approved or cleared by the [place] FDA and has been authorized for detection and/or diagnosis of SARS-CoV-2 by FDA under an Emergency Use Authorization (EUA). This EUA will remain in effect (meaning this test can be used) for the duration of the COVID-19 declaration under Section 564(b)(1) of the Act, 21 U.S.C. section 360bbb-3(b)(1), unless the authorization is terminated or revoked.     Resp Syncytial Virus by PCR NEGATIVE NEGATIVE Final    Comment: (NOTE) Fact Sheet for Patients: BloggerCourse.com  Fact Sheet for Healthcare Providers: SeriousBroker.it  This test is not  yet approved or cleared by the [place] FDA and has been authorized for detection and/or diagnosis of SARS-CoV-2 by FDA under an Emergency Use Authorization (EUA). This EUA will remain in effect (meaning this test can be used) for the duration of the COVID-19 declaration under Section 564(b)(1) of the Act, 21 U.S.C. section 360bbb-3(b)(1), unless the authorization is terminated or revoked.  Performed at [facility], [address], [place], [place] [address]   Aerobic/Anaerobic Culture w Gram Stain (surgical/deep wound)     Status: None   Collection Time: 05/09/23 11:30 AM   Specimen: Wound  Result Value Ref Range Status   Specimen Description   Final    WOUND Performed at [facility], [address], [place], [place] [address]    Special Requests   Final    LEFT HIP Performed at [facility], [address] [place], [place] [address]    Gram Stain   Final    RARE WBC PRESENT, PREDOMINANTLY PMN MODERATE GRAM NEGATIVE RODS MODERATE GRAM POSITIVE COCCI IN PAIRS    Culture   Final    ABUNDANT STAPHYLOCOCCUS AUREUS ABUNDANT STREPTOCOCCUS MITIS/ORALIS ABUNDANT BACTEROIDES FRAGILIS BETA LACTAMASE POSITIVE Performed at [facility], [address], [place], [place]  [address]    Report Status 05/12/2023 FINAL  Final   Organism ID, Bacteria STAPHYLOCOCCUS AUREUS  Final   Organism ID, Bacteria STREPTOCOCCUS MITIS/ORALIS  Final      Susceptibility   Staphylococcus aureus - MIC*    CIPROFLOXACIN <=0.5 SENSITIVE Sensitive     ERYTHROMYCIN 4 INTERMEDIATE Intermediate     GENTAMICIN <=0.5 SENSITIVE Sensitive     OXACILLIN <=0.25 SENSITIVE Sensitive     TETRACYCLINE <=1 SENSITIVE Sensitive     VANCOMYCIN 1 SENSITIVE Sensitive     TRIMETH/SULFA <=10 SENSITIVE Sensitive     CLINDAMYCIN <=0.25 SENSITIVE Sensitive     RIFAMPIN <=0.5 SENSITIVE Sensitive     Inducible Clindamycin NEGATIVE Sensitive     LINEZOLID 2 SENSITIVE Sensitive     *  ABUNDANT STAPHYLOCOCCUS AUREUS   Streptococcus mitis/oralis - MIC*    TETRACYCLINE >=16 RESISTANT Resistant     VANCOMYCIN 0.5 SENSITIVE Sensitive     CLINDAMYCIN >=1 RESISTANT Resistant     * ABUNDANT STREPTOCOCCUS MITIS/ORALIS    Coagulation Studies: No results for input(s): "LABPROT", "INR" in the last 72 hours.  Urinalysis: No results for input(s): "COLORURINE", "LABSPEC", "PHURINE", "GLUCOSEU", "HGBUR", "BILIRUBINUR", "KETONESUR", "PROTEINUR", "UROBILINOGEN", "NITRITE", "LEUKOCYTESUR" in the last 72 hours.  Invalid input(s): "APPERANCEUR"     Imaging: No results found.   Medications:     ceFAZolin (ANCEF) IV 2 g (05/13/23 0854)   metronidazole Stopped (05/12/23 2320)    amLODipine  10 mg Oral Daily   vitamin C  500 mg Oral BID   aspirin EC  81 mg Oral Daily   buPROPion ER  100 mg Oral Daily   calcitRIOL  0.25 mcg Oral Daily   cloNIDine  0.1 mg Oral TID   escitalopram  20 mg Oral Daily   feeding supplement  237 mL Oral BID BM   fluticasone furoate-vilanterol  1 puff Inhalation Daily   And   umeclidinium bromide  1 puff Inhalation Daily   heparin  5,000 Units Subcutaneous Q8H   hydrALAZINE  50 mg Oral TID   insulin aspart  0-5 Units Subcutaneous QHS   insulin aspart  0-9 Units Subcutaneous TID WC   losartan  100 mg Oral Daily   melatonin  5 mg Oral QHS   multivitamin with minerals  1 tablet Oral Daily   nicotine  21 mg Transdermal Daily   potassium chloride  40 mEq Oral Daily   sodium hypochlorite   Irrigation BID   tamsulosin  0.4 mg Oral Daily   zinc sulfate  220 mg Oral Daily   acetaminophen **OR** acetaminophen, albuterol, guaiFENesin-dextromethorphan, labetalol, lip balm, ondansetron **OR** ondansetron (ZOFRAN) IV, traMADol  Assessment/ Plan:  [name] is a 62 y.o.  female with schizophrenia, diabetes mellitus type II, hypertension, tobacco use, hyperlipidemia who is admitted to [facility] on 05/07/2023 for Anasarca [R60.1] Peripheral edema  [R60.0] [name] (acute kidney injury) (HCC) [N17.9] Acute hypoxemic respiratory failure (HCC) [J96.01] Acute on chronic congestive heart failure, unspecified heart failure type (HCC) [I50.9]  Chronic Kidney Disease stage V with nephrotic range proteinuria: baseline creatinine 3.42, GFR of 14 on 8/5. Chronic kidney disease secondary to diabetic nephropathy.  Renal ultrasound negative for hydronephrosis.  - Decreased muscle mass may be affecting creatinine.  - Furosemide held, will restart oral Furosemide 20mg  every other day - Creatinine remains below stated baseline - Continue sodium and fluid restriction - Continue holding dapagliflozin, hydrochlorothiazide and sodium bicarbonate.    Hypertension: with suspected acute exacerbation of congestive heart failure. Volume  driven - ECHO EF shows 60-65% - Current regimen of tamsulosin, hydralazine, and diltiazem.  - Holding hydrochlorothiazide.  - Will decrease dose of furosemide as above  Diabetes mellitus type II with chronic kidney disease: noninsulin dependent.  - Holding dapagliflozin - Primary team to manage SSi  Secondary Hyperparathyroidism:  - continue calcitriol - Will continue to monitor bone minerals during this admission   LOS: 6   10/11/202410:43 AM

## 2023-05-13 NOTE — Progress Notes (Signed)
Mobility Specialist - Progress Note     05/13/23 1604  Mobility  Activity Ambulated with assistance in room  $Mobility charge 1 Mobility   Pt OOC upon entry, author responding to chair alarm. Pt requesting to go outside. Pt redirected back to recliner and told we will go outside tomorrow. Pt left in recliner with needs in reach and chair alarm activated.   Johnathan Hausen Mobility Specialist 05/13/23, 4:12 PM

## 2023-05-13 NOTE — Progress Notes (Signed)
Palliative Care Progress Note, Assessment & Plan   Patient Name: Alicia Rogers       Date: 05/13/2023 DOB: 01-26-1961  Age: 62 y.o. MRN#: [id number] Attending Physician: Gillis Santa, MD Primary Care Physician: Patient, No Pcp Per Admit Date: 05/07/2023  Subjective: Patient is out of bed and sitting in her recliner.  She acknowledges my presence and is able to make her wishes known.  No family or friends present during my visit.  HPI: 62 y.o. female  with past medical history of type 2 diabetes, CKD, MGUS, schizoaffective disorder, HTN, secondary hyperparathyroidism, and hip wounds admitted from a group home on 05/07/2023 with fluid retention/lower extremity edema.   Patient is being treated for anasarca, [name], and acute on chronic CHF.  Additionally, her hip wound was debrided at bedside.  Patient remains on antibiotics with no current need for surgical debridement.   PMT was consulted to discuss goals of care.  Summary of counseling/coordination of care: Extensive chart review completed prior to meeting patient including labs, vital signs, imaging, progress notes, orders, and available advanced directive documents from current and previous encounters.   After reviewing the patient's chart and assessing the patient at bedside, I spoke with patient in regards to symptoms and boundaries of care.  Symptoms assessed.  Patient has no acute complaints at this time.  She shares she is ready to get out of the hospital.  She denies headache, chest pain, nausea, or other acute issues at this time.  No adjustment to more needed.  I again discussed boundaries and goals of care with patient.  She continues to change the subject and avoid giving direct answers when we discussed her CODE STATUS.  I shared that with  no comments or vocalizations of otherwise wanting to change her CODE STATUS, she remains accepting of all offered, available, and appropriate medical interventions to sustain her life.  Full code and full scope remain.  Patient vocalizes that she would like for her sister to be her next of kin/HCPOA.  I shared that the spiritual care team can help with completing this paperwork.  Patient shares she does not want to complete it at this time.  Additionally, I again highlighted the importance of advanced care planning and utilizing the MOST form to convey her wishes.  MOST form still at bedside.  Patient shares she has read it but does not wish to complete it at this time.  Discussed plan is set for patient to d/c to SNF for rehab, which she says she is very happy about as this is close to her sister. TOC following closely for discharge planning.   No acute palliative needs at this time.  Patient continues to decline my offer to speak with her sister.  Patient has PMT contact information and was encouraged to contact PMT with any future acute palliative needs.   PMT will monitor the patient peripherally and shadow her chart. Please re-engage with PMT if goals change, at patient/family's request, or if patient's health deteriorates during hospitalization.    Physical Exam Vitals reviewed.  Constitutional:      General: She is not in acute distress. HENT:     Head: Normocephalic.  Mouth/Throat:     Mouth: Mucous membranes are moist.  Eyes:     Pupils: Pupils are equal, round, and reactive to light.  Pulmonary:     Effort: Pulmonary effort is normal.  Abdominal:     Palpations: Abdomen is soft.  Skin:    General: Skin is warm and dry.     Comments: UTA hip wounds  Neurological:     Mental Status: She is alert and oriented to person, place, and time.  Psychiatric:        Mood and Affect: Mood normal.        Behavior: Behavior normal.        Thought Content: Thought content normal.         Judgment: Judgment normal.             Total Time 35 minutes   Time spent includes: Detailed review of medical records (labs, imaging, vital signs), medically appropriate exam (mental status, respiratory, cardiac, skin), discussed with treatment team, counseling and educating patient, family and staff, documenting clinical information, medication management and coordination of care.  Samara Deist L. Bonita Quin, DNP, FNP-BC Palliative Medicine Team

## 2023-05-13 NOTE — Progress Notes (Signed)
Occupational Therapy Treatment Patient Details Name: Alicia Rogers MRN: [id number] DOB: [birth year] Today's Date: 05/13/2023   History of present illness Pt is a 62 y.o. 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   OT comments  Ms Pantalone was seen for OT treatment on this date. Upon arrival to room pt reclined in bed, agreeable to tx. Pt requires SBA + RW for BSC t/f and seated pericare. SETUP self-feeding. MIN A hand washing in sitting, assist for L hand contracture. Significantly increased proccesing time, pt prefers ADL items setup in specific positions. Pt making good progress toward goals, will continue to follow POC. Discharge recommendation remains appropriate.       If plan is discharge home, recommend the following:  A little help with walking and/or transfers;A lot of help with bathing/dressing/bathroom;Assistance with cooking/housework;Direct supervision/assist for medications management;Assist for transportation;Supervision due to cognitive status;Help with stairs or ramp for entrance   Equipment Recommendations  Other (comment) (defer)    Recommendations for Other Services      Precautions / Restrictions Precautions Precautions: Fall Precaution Comments: B hip wounds Restrictions Weight Bearing Restrictions: No       Mobility Bed Mobility Overal bed mobility: Needs Assistance Bed Mobility: Sit to Supine       Sit to supine: Contact guard assist   General bed mobility comments: pt pulls on hand but no physical assist  given    Transfers Overall transfer level: Needs assistance Equipment used: Rolling walker (2 wheels) Transfers: Sit to/from Stand, Bed to chair/wheelchair/BSC Sit to Stand: Contact guard assist     Step pivot transfers: Contact guard assist           Balance Overall balance assessment: Needs assistance Sitting-balance support: No upper extremity supported, Feet supported Sitting balance-Leahy Scale: Good     Standing balance support: Reliant on assistive device for balance, During functional activity, Bilateral upper extremity supported Standing balance-Leahy Scale: Good                             ADL either performed or assessed with clinical judgement   ADL Overall ADL's : Needs assistance/impaired                                       General ADL Comments: SBA + RW for BSC t/f and seated pericare. SETUP self-feeding. MIN A hand washing in sitting, assist for L hand contracture      Cognition Arousal: Alert Behavior During Therapy: Flat affect Overall Cognitive Status: No family/caregiver present to determine baseline cognitive functioning                                 General Comments: increased time to process and respond to questions; flat affect and very soft spoken                   Pertinent Vitals/ Pain  Pain Assessment Pain Assessment: No/denies pain   Frequency  Min 1X/week        Progress Toward Goals  OT Goals(current goals can now be found in the care plan section)  Progress towards OT goals: Progressing toward goals  Acute Rehab OT Goals Patient Stated Goal: to go home OT Goal Formulation: With patient Time For Goal Achievement: 05/23/23 Potential to Achieve Goals: Fair ADL Goals Pt Will Perform Grooming: with min assist;standing Pt Will Transfer to Toilet: with supervision;ambulating;grab bars Pt Will Perform Toileting - Clothing Manipulation and hygiene: with supervision;sit  to/from stand  Plan      Co-evaluation                 AM-PAC OT "6 Clicks" Daily Activity     Outcome Measure   Help from another person eating meals?: A Little Help from another person taking care of personal grooming?: A Little Help from another person toileting, which includes using toliet, bedpan, or urinal?: A Little Help from another person bathing (including washing, rinsing, drying)?: A Lot Help from another person to put on and taking off regular upper body clothing?: A Little Help from another person to put on and taking off regular lower body clothing?: A Lot 6 Click Score: 16    End of Session    OT Visit Diagnosis: Unsteadiness on feet (R26.81);Muscle weakness (generalized) (M62.81);Pain   Activity Tolerance Patient tolerated treatment well   Patient Left in chair;with call bell/phone within reach;with chair alarm set   Nurse Communication          Time: [id number] OT Time Calculation (min): 35 min  Charges: OT General Charges $OT Visit: 1 Visit OT Treatments $Self Care/Home Management : 23-37 mins  Kathie Dike, M.S. OTR/L  05/13/23, 9:57 AM  ascom [phone]

## 2023-05-14 DIAGNOSIS — R601 Generalized edema: Secondary | ICD-10-CM | POA: Diagnosis not present

## 2023-05-14 LAB — CBC
HCT: 29.4 % — ABNORMAL LOW (ref 36.0–46.0)
Hemoglobin: 8.7 g/dL — ABNORMAL LOW (ref 12.0–15.0)
MCH: 28 pg (ref 26.0–34.0)
MCHC: 29.6 g/dL — ABNORMAL LOW (ref 30.0–36.0)
MCV: 94.5 fL (ref 80.0–100.0)
Platelets: 601 10*3/uL — ABNORMAL HIGH (ref 150–400)
RBC: 3.11 MIL/uL — ABNORMAL LOW (ref 3.87–5.11)
RDW: 14.7 % (ref 11.5–15.5)
WBC: 12.8 10*3/uL — ABNORMAL HIGH (ref 4.0–10.5)
nRBC: 0.2 % (ref 0.0–0.2)

## 2023-05-14 LAB — GLUCOSE, CAPILLARY
Glucose-Capillary: 110 mg/dL — ABNORMAL HIGH (ref 70–99)
Glucose-Capillary: 242 mg/dL — ABNORMAL HIGH (ref 70–99)
Glucose-Capillary: 157 mg/dL — ABNORMAL HIGH (ref 70–99)
Glucose-Capillary: 66 mg/dL — ABNORMAL LOW (ref 70–99)
Glucose-Capillary: 143 mg/dL — ABNORMAL HIGH (ref 70–99)

## 2023-05-14 LAB — BASIC METABOLIC PANEL
Anion gap: 8 (ref 5–15)
BUN: 42 mg/dL — ABNORMAL HIGH (ref 8–23)
CO2: 26 mmol/L (ref 22–32)
Calcium: 9.4 mg/dL (ref 8.9–10.3)
Chloride: 106 mmol/L (ref 98–111)
Creatinine, Ser: 3.44 mg/dL — ABNORMAL HIGH (ref 0.44–1.00)
GFR, Estimated: 14 mL/min — ABNORMAL LOW (ref >60)
Glucose, Bld: 128 mg/dL — ABNORMAL HIGH (ref 70–99)
Potassium: 5 mmol/L (ref 3.5–5.1)
Sodium: 140 mmol/L (ref 135–145)

## 2023-05-14 LAB — PROCALCITONIN: Procalcitonin: 0.42 ng/mL

## 2023-05-14 MED ORDER — LOSARTAN POTASSIUM 50 MG PO TABS
100.0000 mg | ORAL_TABLET | Freq: Every day | ORAL | Status: DC
  Administered 2023-05-14 – 2023-05-16 (×3): 100 mg via ORAL
  Filled 2023-05-14 (×3): qty 2

## 2023-05-14 NOTE — Progress Notes (Signed)
[facility]  ROUNDING NOTE   Subjective:   [name] was admitted to [facility] on 05/07/2023 for Anasarca [R60.1] Peripheral edema [R60.0] [name] (acute kidney injury) (HCC) [N17.9] Acute hypoxemic respiratory failure (HCC) [J96.01] Acute on chronic congestive heart failure, unspecified heart failure type [facility]) [I50.9]  Patient was seen by Dr. Thedore Mins on 9/4 where she was referred for vein mapping for dialysis planning.   Patient laying in bed Alert Remains on room air Appetite slowly improving  Creatinine 3.44  Objective:  Vital signs in last 24 hours:  Temp:  [97.6 F (36.4 C)-98.6 F (37 C)] 98.6 F (37 C) (10/12 0813) Pulse Rate:  [58-88] 86 (10/12 0813) Resp:  [16-18] 16 (10/12 0813) BP: (129-182)/(59-82) 182/75 (10/12 0813) SpO2:  [97 %-100 %] 98 % (10/12 0813)  Weight change:  Filed Weights   05/07/23 1618 05/09/23 0500 05/10/23 0433  Weight: 49 kg 53.4 kg 54 kg    Intake/Output: I/O last 3 completed shifts: In: 200.9 [IV Piggyback:200.9] Out: -    Intake/Output this shift:  No intake/output data recorded.  Physical Exam: General: NAD  Head: Normocephalic, atraumatic. Moist oral mucosal membranes  Eyes: Anicteric  Lungs:  Clear to auscultation  Heart: Regular rate and rhythm  Abdomen:  Soft, nontender  Extremities: No peripheral edema.  Neurologic: Alert, moving all four extremities  Skin: No lesions  Access: none    Basic Metabolic Panel: Recent Labs  Lab 05/09/23 0819 05/10/23 0838 05/12/23 0519 05/13/23 0358 05/14/23 0526  NA 140 138 138 140 140  K 4.9 4.6 4.3 4.2 5.0  CL 102 100 98 101 106  CO2 27 24 27 26 26   GLUCOSE 159* 176* 150* 138* 128*  BUN 30* 34* 42* 46* 42*  CREATININE 2.70* 2.80* 3.15* 3.38* 3.44*  CALCIUM 9.2 9.1 8.9 9.2 9.4  MG 2.0  --  2.4 2.6*  --   PHOS  --   --  5.1* 4.2  --     Liver Function Tests: Recent Labs  Lab 05/07/23 1619  AST 22  ALT 22  ALKPHOS 105  BILITOT 0.5  PROT 7.1   ALBUMIN 2.0*   No results for input(s): "LIPASE", "AMYLASE" in the last 168 hours. No results for input(s): "AMMONIA" in the last 168 hours.  CBC: Recent Labs  Lab 05/07/23 1619 05/08/23 0412 05/09/23 0819 05/10/23 0838 05/12/23 0519 05/13/23 0358 05/14/23 0526  WBC 14.6*   < > 15.7* 13.3* 9.1 11.0* 12.8*  NEUTROABS 10.6*  --  12.0* 10.7*  --   --   --   HGB 8.4*   < > 9.1* 9.5* 8.7* 8.8* 8.7*  HCT 27.3*   < > 29.5* 30.3* 27.8* 29.2* 29.4*  MCV 93.2   < > 91.9 90.7 89.7 94.2 94.5  PLT 458*   < > 541* 578* 594* 639* 601*   < > = values in this interval not displayed.    Cardiac Enzymes: No results for input(s): "CKTOTAL", "CKMB", "CKMBINDEX", "TROPONINI" in the last 168 hours.  BNP: Invalid input(s): "POCBNP"  CBG: Recent Labs  Lab 05/13/23 0754 05/13/23 1247 05/13/23 1521 05/13/23 2014 05/14/23 0813  GLUCAP 140* 148* 134* 104* 110*    Microbiology: Results for orders placed or performed during the hospital encounter of 05/07/23  Resp panel by RT-PCR (RSV, Flu A&B, Covid) Anterior Nasal Swab     Status: None   Collection Time: 05/07/23 10:26 PM   Specimen: Anterior Nasal Swab  Result Value Ref Range Status  SARS Coronavirus 2 by RT PCR NEGATIVE NEGATIVE Final    Comment: (NOTE) SARS-CoV-2 target nucleic acids are NOT DETECTED.  The SARS-CoV-2 RNA is generally detectable in upper respiratory specimens during the acute phase of infection. The lowest concentration of SARS-CoV-2 viral copies this assay can detect is 138 copies/mL. A negative result does not preclude SARS-Cov-2 infection and should not be used as the sole basis for treatment or other patient management decisions. A negative result may occur with  improper specimen collection/handling, submission of specimen other than nasopharyngeal swab, presence of viral mutation(s) within the areas targeted by this assay, and inadequate number of viral copies(<138 copies/mL). A negative result must be  combined with clinical observations, patient history, and epidemiological information. The expected result is Negative.  Fact Sheet for Patients:  BloggerCourse.com  Fact Sheet for Healthcare Providers:  SeriousBroker.it  This test is no t yet approved or cleared by the [place] FDA and  has been authorized for detection and/or diagnosis of SARS-CoV-2 by FDA under an Emergency Use Authorization (EUA). This EUA will remain  in effect (meaning this test can be used) for the duration of the COVID-19 declaration under Section 564(b)(1) of the Act, 21 U.S.C.section 360bbb-3(b)(1), unless the authorization is terminated  or revoked sooner.       Influenza A by PCR NEGATIVE NEGATIVE Final   Influenza B by PCR NEGATIVE NEGATIVE Final    Comment: (NOTE) The Xpert Xpress SARS-CoV-2/FLU/RSV plus assay is intended as an aid in the diagnosis of influenza from Nasopharyngeal swab specimens and should not be used as a sole basis for treatment. Nasal washings and aspirates are unacceptable for Xpert Xpress SARS-CoV-2/FLU/RSV testing.  Fact Sheet for Patients: BloggerCourse.com  Fact Sheet for Healthcare Providers: SeriousBroker.it  This test is not yet approved or cleared by the [place] FDA and has been authorized for detection and/or diagnosis of SARS-CoV-2 by FDA under an Emergency Use Authorization (EUA). This EUA will remain in effect (meaning this test can be used) for the duration of the COVID-19 declaration under Section 564(b)(1) of the Act, 21 U.S.C. section 360bbb-3(b)(1), unless the authorization is terminated or revoked.     Resp Syncytial Virus by PCR NEGATIVE NEGATIVE Final    Comment: (NOTE) Fact Sheet for Patients: BloggerCourse.com  Fact Sheet for Healthcare Providers: SeriousBroker.it  This test is not yet  approved or cleared by the [place] FDA and has been authorized for detection and/or diagnosis of SARS-CoV-2 by FDA under an Emergency Use Authorization (EUA). This EUA will remain in effect (meaning this test can be used) for the duration of the COVID-19 declaration under Section 564(b)(1) of the Act, 21 U.S.C. section 360bbb-3(b)(1), unless the authorization is terminated or revoked.  Performed at [facility], [address] [place], [place] [address]   Aerobic/Anaerobic Culture w Gram Stain (surgical/deep wound)     Status: None   Collection Time: 05/09/23 11:30 AM   Specimen: Wound  Result Value Ref Range Status   Specimen Description   Final    WOUND Performed at [facility], [address], [place], [place] [address]    Special Requests   Final    LEFT HIP Performed at [facility], [address] [place], [place] [address]    Gram Stain   Final    RARE WBC PRESENT, PREDOMINANTLY PMN MODERATE GRAM NEGATIVE RODS MODERATE GRAM POSITIVE COCCI IN PAIRS    Culture   Final    ABUNDANT STAPHYLOCOCCUS AUREUS ABUNDANT STREPTOCOCCUS  MITIS/ORALIS ABUNDANT BACTEROIDES FRAGILIS BETA LACTAMASE POSITIVE Performed at [facility], [address] [place], [place] [address]    Report Status 05/12/2023 FINAL  Final   Organism ID, Bacteria STAPHYLOCOCCUS AUREUS  Final   Organism ID, Bacteria STREPTOCOCCUS MITIS/ORALIS  Final      Susceptibility   Staphylococcus aureus - MIC*    CIPROFLOXACIN <=0.5 SENSITIVE Sensitive     ERYTHROMYCIN 4 INTERMEDIATE Intermediate     GENTAMICIN <=0.5 SENSITIVE Sensitive     OXACILLIN <=0.25 SENSITIVE Sensitive     TETRACYCLINE <=1 SENSITIVE Sensitive     VANCOMYCIN 1 SENSITIVE Sensitive     TRIMETH/SULFA <=10 SENSITIVE Sensitive     CLINDAMYCIN <=0.25 SENSITIVE Sensitive     RIFAMPIN <=0.5 SENSITIVE Sensitive     Inducible Clindamycin NEGATIVE Sensitive     LINEZOLID 2 SENSITIVE Sensitive     * ABUNDANT  STAPHYLOCOCCUS AUREUS   Streptococcus mitis/oralis - MIC*    TETRACYCLINE >=16 RESISTANT Resistant     VANCOMYCIN 0.5 SENSITIVE Sensitive     CLINDAMYCIN >=1 RESISTANT Resistant     * ABUNDANT STREPTOCOCCUS MITIS/ORALIS    Coagulation Studies: No results for input(s): "LABPROT", "INR" in the last 72 hours.  Urinalysis: No results for input(s): "COLORURINE", "LABSPEC", "PHURINE", "GLUCOSEU", "HGBUR", "BILIRUBINUR", "KETONESUR", "PROTEINUR", "UROBILINOGEN", "NITRITE", "LEUKOCYTESUR" in the last 72 hours.  Invalid input(s): "APPERANCEUR"     Imaging: No results found.   Medications:     ceFAZolin (ANCEF) IV 2 g (05/13/23 2049)   metronidazole 500 mg (05/13/23 2249)    amLODipine  10 mg Oral Daily   vitamin C  500 mg Oral BID   aspirin EC  81 mg Oral Daily   buPROPion ER  100 mg Oral Daily   calcitRIOL  0.25 mcg Oral Daily   cloNIDine  0.1 mg Oral TID   escitalopram  20 mg Oral Daily   feeding supplement  237 mL Oral BID BM   fluticasone furoate-vilanterol  1 puff Inhalation Daily   And   umeclidinium bromide  1 puff Inhalation Daily   furosemide  20 mg Oral QODAY   heparin  5,000 Units Subcutaneous Q8H   hydrALAZINE  50 mg Oral TID   insulin aspart  0-5 Units Subcutaneous QHS   insulin aspart  0-9 Units Subcutaneous TID WC   losartan  100 mg Oral Daily   melatonin  5 mg Oral QHS   multivitamin with minerals  1 tablet Oral Daily   nicotine  21 mg Transdermal Daily   potassium chloride  40 mEq Oral Daily   sodium hypochlorite   Irrigation BID   tamsulosin  0.4 mg Oral Daily   zinc sulfate  220 mg Oral Daily   acetaminophen **OR** acetaminophen, albuterol, guaiFENesin-dextromethorphan, labetalol, lip balm, ondansetron **OR** ondansetron (ZOFRAN) IV, traMADol  Assessment/ Plan:  [name] is a 62 y.o.  female with schizophrenia, diabetes mellitus type II, hypertension, tobacco use, hyperlipidemia who is admitted to [facility] on [facility]07/2023 for Anasarca  [R60.1] Peripheral edema [R60.0] [name] (acute kidney injury) (HCC) [N17.9] Acute hypoxemic respiratory failure (HCC) [J96.01] Acute on chronic congestive heart failure, unspecified heart failure type (HCC) [I50.9]  Chronic Kidney Disease stage V with nephrotic range proteinuria: baseline creatinine 3.42, GFR of 14 on 8/5. Chronic kidney disease secondary to diabetic nephropathy.  Renal ultrasound negative for hydronephrosis.  - Decreased muscle mass may be affecting creatinine.  - Creatinine remains acceptable.  - Continue Furosemide 20mg  every day - Continue sodium and fluid  restriction - Holding dapagliflozin, hydrochlorothiazide and sodium bicarbonate.    Hypertension: with suspected acute exacerbation of congestive heart failure. Volume driven - ECHO EF shows 60-65% - Current regimen of tamsulosin, hydralazine, and diltiazem.  - Holding hydrochlorothiazide.  -  Furosemide as above  Diabetes mellitus type II with chronic kidney disease: noninsulin dependent.  - Holding dapagliflozin - Glucose well controlled.   Secondary Hyperparathyroidism:  - continue calcitriol - Calcium and phos within desired range   LOS: 7   10/12/202411:10 AM

## 2023-05-14 NOTE — Progress Notes (Signed)
Mobility Specialist - Progress Note     05/14/23 1559  Mobility  Activity Ambulated with assistance in hallway  Level of Assistance Contact guard assist, steadying assist  Assistive Device Front wheel walker  Distance Ambulated (ft) 80 ft  Range of Motion/Exercises Active  Activity Response Tolerated well  Mobility Referral Yes  $Mobility charge 1 Mobility  Mobility Specialist Start Time (ACUTE ONLY) 1504  Mobility Specialist Stop Time (ACUTE ONLY) 1549  Mobility Specialist Time Calculation (min) (ACUTE ONLY) 45 min   Pt resting in recliner on RA upon entry. Pt STS and ambulates 40 ft to wheelchair before being wheeled outside area. Pt returned to unit and ambulates from wheelchair to recliner and left with needs in reach. Chair alarm activated.   Johnathan Hausen Mobility Specialist 05/14/23, 4:21 PM

## 2023-05-14 NOTE — TOC Progression Note (Addendum)
Transition of Care [facility]) - Progression Note    Patient Details  Name: Alicia Rogers MRN: [id number] Date of Birth: 1961-06-22  Transition of Care [facility]) CM/SW Contact  Liliana Cline, LCSW Phone Number: 05/14/2023, 12:42 PM  Clinical Narrative:    Asked MD when to start auth for [facility], per MD likely Monday. Plan to start auth tomorrow in preparation for Monday DC to [facility].        Expected Discharge Plan and Services                                               Social Determinants of Health (SDOH) Interventions SDOH Screenings   Food Insecurity: No Food Insecurity (03/04/2023)   Received from [facility]  Housing: Patient Declined (08/30/2022)  Transportation Needs: No Transportation Needs (03/04/2023)   Received from [facility]  Utilities: Not At Risk (03/04/2023)   Received from [facility]  Alcohol Screen: Low Risk  (09/29/2021)  Financial Resource Strain: Medium Risk (03/04/2023)   Received from [facility]  Social Connections: Unknown (09/28/2022)   Received from [facility]  Tobacco Use: High Risk (05/07/2023)    Readmission Risk Interventions    05/10/2023   10:05 AM  Readmission Risk Prevention Plan  Transportation Screening Complete  HRI or Home Care Consult Complete  Palliative Care Screening Complete  Medication Review (RN Care Manager) Complete

## 2023-05-14 NOTE — Plan of Care (Signed)
  Problem: Education: Goal: Ability to demonstrate management of disease process will improve Outcome: Progressing   Problem: Activity: Goal: Capacity to carry out activities will improve Outcome: Progressing   Problem: Coping: Goal: Ability to adjust to condition or change in health will improve Outcome: Progressing   Problem: Fluid Volume: Goal: Ability to maintain a balanced intake and output will improve Outcome: Progressing   Problem: Nutritional: Goal: Maintenance of adequate nutrition will improve Outcome: Progressing   Problem: Skin Integrity: Goal: Risk for impaired skin integrity will decrease Outcome: Progressing   Problem: Tissue Perfusion: Goal: Adequacy of tissue perfusion will improve Outcome: Progressing   Problem: Activity: Goal: Risk for activity intolerance will decrease Outcome: Progressing   Problem: Coping: Goal: Level of anxiety will decrease Outcome: Progressing   Problem: Pain Managment: Goal: General experience of comfort will improve Outcome: Progressing   Problem: Safety: Goal: Ability to remain free from injury will improve Outcome: Progressing   Problem: Nutrition: Goal: Adequate nutrition will be maintained Outcome: Not Progressing, poor appetite

## 2023-05-14 NOTE — Progress Notes (Signed)
[facility] Progress Note  Patient: Alicia Rogers    ZOX:[id number]  DOA: 05/07/2023     Date of Service: the patient was seen and examined on 05/14/2023  Chief Complaint  Patient presents with   Leg Swelling   Brief hospital course: 62 y.o. 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  Patient who ambulates with a walker at  baseline. has had no orthopnea or shortness of breath no complaints of chest pain.  Patient was last seen by her nephrologist on 04/07/23 when she was noted to have lower extremity edema and her torsemide was replaced with furosemide at that time. Denies cough, fever or chills or leg pain. Endorses dysuria/pain with urination.  Of note, during hospitalization in January 2024, patient was noted to have a pulmonary nodule which showed complete CT resolution by the time of pulmonology follow-up 03/2023.  Pulmonologist did express some concern for aspiration for which barium swallow was ordered, (not performed) for ongoing cough however patient currently denies  cough    Seen in consultation by nephrology   WOC involved for evaluation of bilateral hip wounds.  Noted to have unstageable left hip/trochanter wound with foul-smelling tan-colored drainage.  Recommend surgical evaluation.  Surgical consult requested.    Assessment and Plan: # Anasarca, Resolved  Stage V CKD with nephrotic range proteinuria Small bilateral pleural effusions Suspect anasarca is renal and not cardiac BNP elevated over 400  Urine output poor No  hydronephrosis noted on ultrasound Plan: No indication for dialysis acutely S/p IV Lasix 60 mg twice daily, transitioned to Lasix 40 mg p.o. daily On 10/8 10/10 creatinine slightly elevated, discontinued Lasix but patient had received a dose today Salt and fluid restrict Hold nephrotoxins TTE LVEF 60-65%, no wall motion abnormality. 10/11 started Lasix 20 mg every other day as per nephrology 10/12 Cr 3.44, slightly elevated, no improvement, continue to monitor  Acute urinary retention Following admission, patient could not produce a urine specimen and In-N-Out cath yielded 800 mL.  Significant for glucosuria and significant proteinuria Plan: Flomax 0.4 mg daily ordered   Leukocytosis Possible respiratory tract infection versus urinary tract infection Hypoxia/acute respiratory failure with hypoxia Full-thickness left hip wound with drainage WBC 14,000, with low-grade temp of 99.1 on arrival, with mild hypoxia to 88% in the ED requiring O2 at 2 L.  WOC noted left hip wound with foul-smelling drainage Chest x-ray showing hazy airspace disease at bases Urinalysis bland MRI hip no evidence of osteo Plan: Wound culture growing MSSA and strep mitis/oralis S/p vancomycin and Zosyn, 10/10 started cefazolin and Flagyl as per sensitivity report Pharmacy was consulted for antibiotics Appreciate surgical follow-up.  No operating room debridement indicated at this time 10/12 WBC 12.8, slightly elevated, check procalcitonin level    Essential hypertension Suboptimal control Continue losartan 100 mg pod, hydralazine 50 mg po TID S/p amlodipine 10 mg daily, d/c'd on 10/9 and started clonidine 0.2 mg p.o. twice daily S/p Lasix 40 mg p.o. daily x 3 days, d/c'd on 10/10 after am dose  10/10 still has high blood pressure and bradycardia, discontinued Cardizem CD 360 mg home dose,  Started  amlodipine 10 mg p.o. daily and changed to clonidine 0.1 mg p.o. 3 times daily with holding parameters We will  continue to monitor BP and titrate medications accordingly   Iron deficiency anemia Anemia of CKD stage V 10/11 Hemoglobin 8.8 slightly low Continue to monitor and transfuse if necessary Currently no indication for transfusion Monitor and transfuse as necessary.   Goal hemoglobin greater than 7  Chronic obstructive pulmonary disease Not acutely exacerbated Continue home inhalers DuoNebs as needed   Schizoaffective disorder, bipolar type Continue home meds    Diabetes mellitus, type 2 Sliding scale insulin coverage   Tobacco use disorder Nicotine patch   Body mass index is 21.77 kg/m.  Interventions:  Pressure Injury 05/08/23 Thigh Anterior;Left;Proximal Unstageable - Full thickness tissue loss in which the base of the injury is covered by slough (yellow, tan, gray, green or brown) and/or eschar (tan, brown or black) in the wound bed. slough covered w (Active)  05/08/23 1620  Location: Thigh  Location Orientation: Anterior;Left;Proximal  Staging: Unstageable - Full thickness tissue loss in which the base of the injury is covered by slough (yellow, tan, gray, green or brown) and/or eschar (tan, brown or black) in the wound bed.  Wound Description (Comments): slough covered wound  Present on Admission: Yes  Dressing Type Foam - Lift dressing to assess site every shift;Gauze (Comment);Dakin's-soaked gauze 05/13/23 2100     Diet: Regular diet DVT Prophylaxis: Subcutaneous Heparin    Advance goals of care discussion: Full code  Family Communication: family was not present at bedside, at the time of interview.  The pt provided permission to discuss medical plan with the family. Opportunity was given to ask question and all questions were answered satisfactorily.   Disposition:  Pt is from group home, admitted with anasarca, [name], left hip ulcer, s/p debridement still on IV antibiotics, and elevated creatinine, which precludes a safe discharge. Discharge to SNF in 1 to 2  days Follow TOC for disposition plan   Subjective: No significant events overnight, patient did use bedside commode, stated she is getting better, no new active issues.  Physical Exam: General: NAD, lying comfortably Appear in no distress, affect appropriate Eyes: PERRLA ENT: Oral Mucosa Clear, moist  Neck: no JVD,  Cardiovascular: S1 and S2 Present, no Murmur,  Respiratory: good respiratory effort, Bilateral Air entry equal and Decreased, no Crackles, no wheezes Abdomen: Bowel Sound present, Soft and no tenderness,  Skin: Left Hip ulcer status post debridement, dressing CDI Extremities: no Pedal edema, no calf tenderness Neurologic: without any new focal findings Gait not checked due to patient safety concerns  Vitals:   05/13/23 1524 05/13/23 2014 05/14/23 0613 05/14/23 0813  BP: (!) 129/59 (!) 164/64 (!) 174/82 (!) 182/75  Pulse: (!) 58 75 88 86  Resp: 17 16 18 16   Temp: 97.6 F (36.4 C) 97.8 F (36.6 C) 98.5 F (36.9 C) 98.6 F (37 C)  TempSrc:  Oral Oral   SpO2: 100% 100% 97% 98%  Weight:      Height:       No intake or output data in the 24 hours ending 05/14/23 1115  Filed Weights   05/07/23 1618 05/09/23 0500 05/10/23 0433  Weight: 49 kg 53.4 kg 54 kg    Data Reviewed: I have personally reviewed and interpreted daily labs, tele strips, imagings as discussed above. I reviewed all nursing notes, pharmacy notes, vitals, pertinent old records I have discussed plan of care as described above with RN and patient/family.  CBC:  Recent Labs  Lab 05/07/23 1619 05/08/23 0412 05/09/23 0819 05/10/23 0838 05/12/23 0519 05/13/23 0358 05/14/23 0526  WBC 14.6*   < > 15.7* 13.3* 9.1 11.0* 12.8*  NEUTROABS 10.6*  --  12.0* 10.7*  --   --   --   HGB 8.4*   < > 9.1* 9.5* 8.7* 8.8* 8.7*  HCT 27.3*   < > 29.5* 30.3* 27.8* 29.2* 29.4*  MCV 93.2   < > 91.9 90.7 89.7 94.2 94.5  PLT 458*   < > 541* 578* 594* 639* 601*   < > = values in this interval not displayed.    Basic Metabolic Panel: Recent Labs  Lab 05/09/23 0819 05/10/23 0838 05/12/23 0519 05/13/23 0358 05/14/23 0526  NA 140 138 138 140 140  K 4.9 4.6 4.3 4.2 5.0  CL 102 100 98 101 106  CO2 27 24 27 26 26   GLUCOSE 159* 176* 150* 138* 128*  BUN 30* 34* 42* 46* 42*  CREATININE 2.70* 2.80* 3.15* 3.38* 3.44*  CALCIUM 9.2 9.1 8.9 9.2 9.4  MG 2.0  --  2.4 2.6*  --   PHOS  --   --  5.1* 4.2  --     Studies: No results found.  Scheduled Meds:  amLODipine  10 mg Oral Daily   vitamin C  500 mg Oral BID   aspirin EC  81 mg Oral Daily   buPROPion ER  100 mg Oral Daily   calcitRIOL  0.25 mcg Oral Daily   cloNIDine  0.1 mg Oral TID   escitalopram  20 mg Oral Daily   feeding supplement  237 mL Oral BID BM   fluticasone furoate-vilanterol  1 puff Inhalation Daily   And   umeclidinium bromide  1 puff Inhalation Daily   furosemide  20 mg Oral QODAY   heparin  5,000 Units Subcutaneous Q8H   hydrALAZINE  50 mg Oral TID   insulin aspart  0-5 Units Subcutaneous QHS   insulin aspart  0-9 Units Subcutaneous TID WC   losartan  100 mg Oral Daily   melatonin  5 mg Oral QHS   multivitamin with minerals  1 tablet Oral Daily   nicotine  21 mg Transdermal Daily   potassium chloride  40 mEq Oral Daily   sodium hypochlorite   Irrigation BID   tamsulosin  0.4 mg Oral Daily   zinc sulfate  220 mg Oral Daily   Continuous Infusions:   ceFAZolin (ANCEF) IV 2 g (05/13/23 2049)   metronidazole 500 mg (05/13/23 2249)   PRN Meds: acetaminophen **OR** acetaminophen, albuterol, guaiFENesin-dextromethorphan, labetalol, lip balm, ondansetron **OR** ondansetron (ZOFRAN) IV, traMADol  Time spent: 35 minutes  Author: Gillis Santa. MD [facility] 05/14/2023 11:15 AM  To reach On-call, see care teams to locate the attending and reach out to them via [email or web address]. If 7PM-7AM, please contact night-coverage If you still have difficulty reaching the attending provider, please page the [facility] (Director on  Call) for [facility] on amion for assistance.

## 2023-05-14 NOTE — Progress Notes (Signed)
Physical Therapy Treatment Patient Details Name: Alicia Rogers MRN: [id number] DOB: [birth year] Today's Date: 05/14/2023   History of Present Illness Pt is a 62 y.o. 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    PT Comments  Pt tolerated treatment fair today. Today she requires supervision-min assist for bed mobility, CGA-min assist for multiple transfers from EOB, BSC, and recliner, and CGA to ambulate 37ft x2. Pt requires increased assist today for pericare after BM; BM noted to be dark, soft, with very minimal blood noted (RN notified). PT to assist with multiple bouts of wiping as pt continued to report "feeling wet/not clean" despite clean toilet paper after wiping. Assisted with gown and linen change.  Pt able to complete ~ x7 STS from recliner with RW; moderate multimodal cues required for safety, sequencing, and hand placement with minimal carry over noted. Pt left seated in recliner upon PT exit with breakfast tray set up; NT notified of pt need for assistance with feeding.  Pt making progress towards goals as she was able to improve overall activity tolerance from last session without excessive amounts of fatigue. Despite progress, she continues to be limited with meeting goals due to functional weakness, impaired safety awareness, limited BUE/L hand AROM, and decreased activity tolerance. She will continue to benefit from skilled acute PT services. Will continue per POC.     If plan is discharge home, recommend the following: A little help  with walking and/or transfers;A little help with bathing/dressing/bathroom;Assist for transportation;Help with stairs or ramp for entrance;Supervision due to cognitive status   Can travel by private vehicle     No  Equipment Recommendations  Other (comment)       Precautions / Restrictions Precautions Precautions: Fall Precaution Comments: B hip wounds Restrictions Weight Bearing Restrictions: No     Mobility  Bed Mobility Overal bed mobility: Needs Assistance Bed Mobility: Sit to Supine       Sit to supine: Min assist   General bed mobility comments: initially supervision for safety to exit to the L but required min assist for upright trunk facilitation at EOB. Multimodal cues for safety, sequencing, and hand placement. Increased time/effort.    Transfers Overall transfer level: Needs assistance Equipment used: Rolling walker (2 wheels) Transfers: Sit to/from Stand, Bed to chair/wheelchair/BSC Sit to Stand: Contact guard assist, Min assist           General transfer comment: Initially min assist to stand from EOB with RW. Progressing to CGA for safety to stand from [facility] and perform multiple STS from recliner (~ x7). Multimodal cues for safety, sequencing, and hand placement.    Ambulation/Gait   Gait Distance (Feet): 10 Feet (51ft x2 (EOB>BSC>recliner)) Assistive device: Rolling walker (2 wheels)         General Gait Details: CGA for safety to ambulate short distance in room. Demonstrates very slowed cadence, decreased step length/foot clearance bil, and kyphotic posture. No knee buckling noted.    Balance Overall balance assessment: Needs assistance Sitting-balance support: No upper extremity supported, Feet supported Sitting balance-Leahy Scale: Good Sitting balance - Comments: Able to maintain seated edge of seat  balance during functional activities   Standing balance support: Reliant on assistive device for balance, During functional activity, Bilateral  upper extremity supported Standing balance-Leahy Scale: Good Standing balance comment: able to maintain static standing balance using RW during functional activities; able to tolerate multiple bouts of static standing from 10-30s for pericare after toileting                            Cognition Arousal: Alert Behavior During Therapy: Flat affect Overall Cognitive Status: No family/caregiver present to determine baseline cognitive functioning                                 General Comments: increased time to process and respond to questions; flat affect and very soft spoken        Exercises Other Exercises Other Exercises: Participates in bed mobility, transfers, and minimal gait. Able to perform toileting on BSC, but requiring increased assist for pericare. as pt was unable to fully clean herself. Continued to assist with wiping as pt reports that "she is not clean" despite clean toilet paper after wiping.    General Comments General comments (skin integrity, edema, etc.): bilateral hip ulcer dressings dry without strikethrough      Pertinent Vitals/Pain Pain Assessment Pain Assessment: Faces Faces Pain Scale: Hurts a little bit Pain Location: bilat hips Pain Descriptors / Indicators: Guarding, Discomfort Pain Intervention(s): Limited activity within patient's tolerance, Monitored during session     PT Goals (current goals can now be found in the care plan section) Acute Rehab PT Goals Patient Stated Goal: to get better PT Goal Formulation: With patient Time For Goal Achievement: 05/23/23 Potential to Achieve Goals: Good    Frequency    Min 1X/week       AM-PAC PT "6 Clicks" Mobility   Outcome Measure  Help needed turning from your back to your side while in a flat bed without using bedrails?: A Little Help needed moving from lying on your back to sitting on the side of a flat bed without using bedrails?: A Little Help needed moving to  and from a bed to a chair (including a wheelchair)?: A Little Help needed standing up from a chair using your arms (e.g., wheelchair or bedside chair)?: A Little Help needed to walk in hospital room?: A Little Help needed climbing 3-5 steps with a railing? : A Lot 6 Click Score: 17    End of Session Equipment Utilized During Treatment: Gait belt Activity Tolerance: Patient limited by fatigue Patient left: in chair;with chair alarm set Nurse Communication: Mobility status PT Visit Diagnosis: Unsteadiness on feet (R26.81);Muscle weakness (generalized) (M62.81);Pain Pain - Right/Left:  (bilateral) Pain - part of body: Hip     Time: 2956-2130 PT Time Calculation (min) (ACUTE ONLY): 45 min  Charges:    $Therapeutic Activity: 38-52 mins PT General Charges $$ ACUTE PT VISIT: 1 Visit                       Vira Blanco, PT, DPT 10:44 AM,05/14/23 Physical Therapist - [facility]

## 2023-05-15 DIAGNOSIS — R601 Generalized edema: Secondary | ICD-10-CM | POA: Diagnosis not present

## 2023-05-15 LAB — CBC
HCT: 28.5 % — ABNORMAL LOW (ref 36.0–46.0)
Hemoglobin: 8.6 g/dL — ABNORMAL LOW (ref 12.0–15.0)
MCH: 28.5 pg (ref 26.0–34.0)
MCHC: 30.2 g/dL (ref 30.0–36.0)
MCV: 94.4 fL (ref 80.0–100.0)
Platelets: 552 10*3/uL — ABNORMAL HIGH (ref 150–400)
RBC: 3.02 MIL/uL — ABNORMAL LOW (ref 3.87–5.11)
RDW: 14.7 % (ref 11.5–15.5)
WBC: 10.9 10*3/uL — ABNORMAL HIGH (ref 4.0–10.5)
nRBC: 0.2 % (ref 0.0–0.2)

## 2023-05-15 LAB — BASIC METABOLIC PANEL
Anion gap: 11 (ref 5–15)
BUN: 38 mg/dL — ABNORMAL HIGH (ref 8–23)
CO2: 23 mmol/L (ref 22–32)
Calcium: 9.4 mg/dL (ref 8.9–10.3)
Chloride: 105 mmol/L (ref 98–111)
Creatinine, Ser: 3.25 mg/dL — ABNORMAL HIGH (ref 0.44–1.00)
GFR, Estimated: 15 mL/min — ABNORMAL LOW (ref >60)
Glucose, Bld: 115 mg/dL — ABNORMAL HIGH (ref 70–99)
Potassium: 4.9 mmol/L (ref 3.5–5.1)
Sodium: 139 mmol/L (ref 135–145)

## 2023-05-15 LAB — GLUCOSE, CAPILLARY
Glucose-Capillary: 111 mg/dL — ABNORMAL HIGH (ref 70–99)
Glucose-Capillary: 157 mg/dL — ABNORMAL HIGH (ref 70–99)
Glucose-Capillary: 103 mg/dL — ABNORMAL HIGH (ref 70–99)
Glucose-Capillary: 115 mg/dL — ABNORMAL HIGH (ref 70–99)

## 2023-05-15 LAB — PROCALCITONIN: Procalcitonin: 0.44 ng/mL

## 2023-05-15 MED ORDER — ISOSORBIDE MONONITRATE ER 30 MG PO TB24
30.0000 mg | ORAL_TABLET | Freq: Every day | ORAL | Status: DC
  Administered 2023-05-15: 30 mg via ORAL
  Filled 2023-05-15: qty 1

## 2023-05-15 MED ORDER — COLLAGENASE 250 UNIT/GM EX OINT
TOPICAL_OINTMENT | Freq: Every day | CUTANEOUS | Status: DC
  Filled 2023-05-15: qty 30

## 2023-05-15 NOTE — Progress Notes (Signed)
[facility]  ROUNDING NOTE   Subjective:   [name] was admitted to [facility] on 05/07/2023 for Anasarca [R60.1] Peripheral edema [R60.0] [name] (acute kidney injury) (HCC) [N17.9] Acute hypoxemic respiratory failure (HCC) [J96.01] Acute on chronic congestive heart failure, unspecified heart failure type [facility]) [I50.9]  Patient was seen by Dr. Thedore Mins on 9/4 where she was referred for vein mapping for dialysis planning.   Patient seen resting in bed, nursing at bedside Tolerating small meals, awaiting breakfast Room air  Lower extremity edema improved  Creatinine 3.25  Objective:  Vital signs in last 24 hours:  Temp:  [97.4 F (36.3 C)-97.8 F (36.6 C)] 97.7 F (36.5 C) (10/13 0745) Pulse Rate:  [55-89] 61 (10/13 0745) Resp:  [12-17] 17 (10/13 0745) BP: (134-182)/(74-86) 182/86 (10/13 0745) SpO2:  [98 %-100 %] 99 % (10/13 0745)  Weight change:  Filed Weights   05/07/23 1618 05/09/23 0500 05/10/23 0433  Weight: 49 kg 53.4 kg 54 kg    Intake/Output: No intake/output data recorded.   Intake/Output this shift:  Total I/O In: 799.1 [IV Piggyback:799.1] Out: -   Physical Exam: General: NAD  Head: Normocephalic, atraumatic. Moist oral mucosal membranes  Eyes: Anicteric  Lungs:  Clear to auscultation  Heart: Regular rate and rhythm  Abdomen:  Soft, nontender  Extremities: No peripheral edema.  Neurologic: Alert, moving all four extremities  Skin: No lesions  Access: none    Basic Metabolic Panel: Recent Labs  Lab 05/09/23 0819 05/10/23 0838 05/12/23 0519 05/13/23 0358 05/14/23 0526 05/15/23 0618  NA 140 138 138 140 140 139  K 4.9 4.6 4.3 4.2 5.0 4.9  CL 102 100 98 101 106 105  CO2 27 24 27 26 26 23   GLUCOSE 159* 176* 150* 138* 128* 115*  BUN 30* 34* 42* 46* 42* 38*  CREATININE 2.70* 2.80* 3.15* 3.38* 3.44* 3.25*  CALCIUM 9.2 9.1 8.9 9.2 9.4 9.4  MG 2.0  --  2.4 2.6*  --   --   PHOS  --   --  5.1* 4.2  --   --     Liver Function  Tests: No results for input(s): "AST", "ALT", "ALKPHOS", "BILITOT", "PROT", "ALBUMIN" in the last 168 hours.  No results for input(s): "LIPASE", "AMYLASE" in the last 168 hours. No results for input(s): "AMMONIA" in the last 168 hours.  CBC: Recent Labs  Lab 05/09/23 0819 05/10/23 0838 05/12/23 0519 05/13/23 0358 05/14/23 0526 05/15/23 0618  WBC 15.7* 13.3* 9.1 11.0* 12.8* 10.9*  NEUTROABS 12.0* 10.7*  --   --   --   --   HGB 9.1* 9.5* 8.7* 8.8* 8.7* 8.6*  HCT 29.5* 30.3* 27.8* 29.2* 29.4* 28.5*  MCV 91.9 90.7 89.7 94.2 94.5 94.4  PLT 541* 578* 594* 639* 601* 552*    Cardiac Enzymes: No results for input(s): "CKTOTAL", "CKMB", "CKMBINDEX", "TROPONINI" in the last 168 hours.  BNP: Invalid input(s): "POCBNP"  CBG: Recent Labs  Lab 05/14/23 1215 05/14/23 1650 05/14/23 2058 05/14/23 2140 05/15/23 0736  GLUCAP 242* 157* 66* 143* 111*    Microbiology: Results for orders placed or performed during the hospital encounter of 05/07/23  Resp panel by RT-PCR (RSV, Flu A&B, Covid) Anterior Nasal Swab     Status: None   Collection Time: 05/07/23 10:26 PM   Specimen: Anterior Nasal Swab  Result Value Ref Range Status   SARS Coronavirus 2 by RT PCR NEGATIVE NEGATIVE Final    Comment: (NOTE) SARS-CoV-2 target nucleic acids are NOT DETECTED.  The SARS-CoV-2 RNA is generally detectable in upper respiratory specimens during the acute phase of infection. The lowest concentration of SARS-CoV-2 viral copies this assay can detect is 138 copies/mL. A negative result does not preclude SARS-Cov-2 infection and should not be used as the sole basis for treatment or other patient management decisions. A negative result may occur with  improper specimen collection/handling, submission of specimen other than nasopharyngeal swab, presence of viral mutation(s) within the areas targeted by this assay, and inadequate number of viral copies(<138 copies/mL). A negative result must be combined  with clinical observations, patient history, and epidemiological information. The expected result is Negative.  Fact Sheet for Patients:  BloggerCourse.com  Fact Sheet for Healthcare Providers:  SeriousBroker.it  This test is no t yet approved or cleared by the [place] FDA and  has been authorized for detection and/or diagnosis of SARS-CoV-2 by FDA under an Emergency Use Authorization (EUA). This EUA will remain  in effect (meaning this test can be used) for the duration of the COVID-19 declaration under Section 564(b)(1) of the Act, 21 U.S.C.section 360bbb-3(b)(1), unless the authorization is terminated  or revoked sooner.       Influenza A by PCR NEGATIVE NEGATIVE Final   Influenza B by PCR NEGATIVE NEGATIVE Final    Comment: (NOTE) The Xpert Xpress SARS-CoV-2/FLU/RSV plus assay is intended as an aid in the diagnosis of influenza from Nasopharyngeal swab specimens and should not be used as a sole basis for treatment. Nasal washings and aspirates are unacceptable for Xpert Xpress SARS-CoV-2/FLU/RSV testing.  Fact Sheet for Patients: BloggerCourse.com  Fact Sheet for Healthcare Providers: SeriousBroker.it  This test is not yet approved or cleared by the [place] FDA and has been authorized for detection and/or diagnosis of SARS-CoV-2 by FDA under an Emergency Use Authorization (EUA). This EUA will remain in effect (meaning this test can be used) for the duration of the COVID-19 declaration under Section 564(b)(1) of the Act, 21 U.S.C. section 360bbb-3(b)(1), unless the authorization is terminated or revoked.     Resp Syncytial Virus by PCR NEGATIVE NEGATIVE Final    Comment: (NOTE) Fact Sheet for Patients: BloggerCourse.com  Fact Sheet for Healthcare Providers: SeriousBroker.it  This test is not yet approved  or cleared by the [place] FDA and has been authorized for detection and/or diagnosis of SARS-CoV-2 by FDA under an Emergency Use Authorization (EUA). This EUA will remain in effect (meaning this test can be used) for the duration of the COVID-19 declaration under Section 564(b)(1) of the Act, 21 U.S.C. section 360bbb-3(b)(1), unless the authorization is terminated or revoked.  Performed at [facility], [address], [place], [place] [address]   Aerobic/Anaerobic Culture w Gram Stain (surgical/deep wound)     Status: None   Collection Time: 05/09/23 11:30 AM   Specimen: Wound  Result Value Ref Range Status   Specimen Description   Final    WOUND Performed at [facility], [address], [place], [place] [address]    Special Requests   Final    LEFT HIP Performed at [facility], [address] [place], [place] [address]    Gram Stain   Final    RARE WBC PRESENT, PREDOMINANTLY PMN MODERATE GRAM NEGATIVE RODS MODERATE GRAM POSITIVE COCCI IN PAIRS    Culture   Final    ABUNDANT STAPHYLOCOCCUS AUREUS ABUNDANT STREPTOCOCCUS MITIS/ORALIS ABUNDANT BACTEROIDES FRAGILIS BETA LACTAMASE POSITIVE Performed at [facility], [address], [place], [place] [address]  Report Status 05/12/2023 FINAL  Final   Organism ID, Bacteria STAPHYLOCOCCUS AUREUS  Final   Organism ID, Bacteria STREPTOCOCCUS MITIS/ORALIS  Final      Susceptibility   Staphylococcus aureus - MIC*    CIPROFLOXACIN <=0.5 SENSITIVE Sensitive     ERYTHROMYCIN 4 INTERMEDIATE Intermediate     GENTAMICIN <=0.5 SENSITIVE Sensitive     OXACILLIN <=0.25 SENSITIVE Sensitive     TETRACYCLINE <=1 SENSITIVE Sensitive     VANCOMYCIN 1 SENSITIVE Sensitive     TRIMETH/SULFA <=10 SENSITIVE Sensitive     CLINDAMYCIN <=0.25 SENSITIVE Sensitive     RIFAMPIN <=0.5 SENSITIVE Sensitive     Inducible Clindamycin NEGATIVE Sensitive     LINEZOLID 2 SENSITIVE Sensitive     * ABUNDANT  STAPHYLOCOCCUS AUREUS   Streptococcus mitis/oralis - MIC*    TETRACYCLINE >=16 RESISTANT Resistant     VANCOMYCIN 0.5 SENSITIVE Sensitive     CLINDAMYCIN >=1 RESISTANT Resistant     * ABUNDANT STREPTOCOCCUS MITIS/ORALIS    Coagulation Studies: No results for input(s): "LABPROT", "INR" in the last 72 hours.  Urinalysis: No results for input(s): "COLORURINE", "LABSPEC", "PHURINE", "GLUCOSEU", "HGBUR", "BILIRUBINUR", "KETONESUR", "PROTEINUR", "UROBILINOGEN", "NITRITE", "LEUKOCYTESUR" in the last 72 hours.  Invalid input(s): "APPERANCEUR"     Imaging: No results found.   Medications:     ceFAZolin (ANCEF) IV 2 g (05/15/23 2202)   metronidazole 100 mL/hr at 05/15/23 0859    amLODipine  10 mg Oral Daily   vitamin C  500 mg Oral BID   aspirin EC  81 mg Oral Daily   buPROPion ER  100 mg Oral Daily   calcitRIOL  0.25 mcg Oral Daily   cloNIDine  0.1 mg Oral TID   escitalopram  20 mg Oral Daily   feeding supplement  237 mL Oral BID BM   fluticasone furoate-vilanterol  1 puff Inhalation Daily   And   umeclidinium bromide  1 puff Inhalation Daily   furosemide  20 mg Oral QODAY   heparin  5,000 Units Subcutaneous Q8H   hydrALAZINE  50 mg Oral TID   insulin aspart  0-5 Units Subcutaneous QHS   insulin aspart  0-9 Units Subcutaneous TID WC   losartan  100 mg Oral Daily   melatonin  5 mg Oral QHS   multivitamin with minerals  1 tablet Oral Daily   nicotine  21 mg Transdermal Daily   potassium chloride  40 mEq Oral Daily   tamsulosin  0.4 mg Oral Daily   zinc sulfate  220 mg Oral Daily   acetaminophen **OR** acetaminophen, albuterol, guaiFENesin-dextromethorphan, labetalol, lip balm, ondansetron **OR** ondansetron (ZOFRAN) IV, traMADol  Assessment/ Plan:  [name] is a 62 y.o.  female with schizophrenia, diabetes mellitus type II, hypertension, tobacco use, hyperlipidemia who is admitted to [facility] on 05/07/2023 for Anasarca [R60.1] Peripheral edema [R60.0] [name] (acute  kidney injury) (HCC) [N17.9] Acute hypoxemic respiratory failure (HCC) [J96.01] Acute on chronic congestive heart failure, unspecified heart failure type (HCC) [I50.9]  Chronic Kidney Disease stage V with nephrotic range proteinuria: baseline creatinine 3.42, GFR of 14 on 8/5. Chronic kidney disease secondary to diabetic nephropathy.  Renal ultrasound negative for hydronephrosis.  - Decreased muscle mass may be affecting creatinine.  - Creatinine continues to slowly improve from initiation of diuretic. - Continue oral furosemide 20mg  every day - Continue sodium and fluid restriction to manage edema - Holding dapagliflozin, hydrochlorothiazide and sodium bicarbonate.    Hypertension: with suspected acute exacerbation of congestive heart failure. Volume  driven. ECHO EF shows 60-65% -Now receiving clonidine, amlodipine, furosemide, hydralazine, tamsulosin and losartan -Continue holding HCTZ  Diabetes mellitus type II with chronic kidney disease: noninsulin dependent.  - Holding dapagliflozin -Primary team will manage sliding scale insulin  Secondary Hyperparathyroidism:  - continue calcitriol -Will continue to monitor bone minerals during this admission.   LOS: 8   10/13/20249:50 AM

## 2023-05-15 NOTE — Progress Notes (Signed)
Patient had a hypoglycemic episode see flow sheet, she was asymptomatic. Protocol orders followed. Sugar level increased with 1st intervention.

## 2023-05-15 NOTE — Plan of Care (Signed)
  Problem: Cardiac: Goal: Ability to achieve and maintain adequate cardiopulmonary perfusion will improve Outcome: Progressing   Problem: Coping: Goal: Ability to adjust to condition or change in health will improve Outcome: Progressing   Problem: Fluid Volume: Goal: Ability to maintain a balanced intake and output will improve Outcome: Progressing   Problem: Skin Integrity: Goal: Risk for impaired skin integrity will decrease Outcome: Progressing   Problem: Nutritional: Goal: Maintenance of adequate nutrition will improve Outcome: Not Progressing

## 2023-05-15 NOTE — Progress Notes (Signed)
[facility] Progress Note  Patient: Alicia Rogers    RUE:[id number]  DOA: 05/07/2023     Date of Service: the patient was seen and examined on 05/15/2023  Chief Complaint  Patient presents with   Leg Swelling   Brief hospital course: 62 y.o. 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  Patient who ambulates with a walker at  baseline. has had no orthopnea or shortness of breath no complaints of chest pain.  Patient was last seen by her nephrologist on 04/07/23 when she was noted to have lower extremity edema and her torsemide was replaced with furosemide at that time. Denies cough, fever or chills or leg pain. Endorses dysuria/pain with urination.  Of note, during hospitalization in January 2024, patient was noted to have a pulmonary nodule which showed complete CT resolution by the time of pulmonology follow-up 03/2023.  Pulmonologist did express some concern for aspiration for which barium swallow was ordered, (not performed) for ongoing cough however patient currently denies  cough    Seen in consultation by nephrology   WOC involved for evaluation of bilateral hip wounds.  Noted to have unstageable left hip/trochanter wound with foul-smelling tan-colored drainage.  Recommend surgical evaluation.  Surgical consult requested.    Assessment and Plan: # Anasarca, Resolved  Stage V CKD with nephrotic range proteinuria Small bilateral pleural effusions Suspect anasarca is renal and not cardiac BNP elevated over 400  Urine output poor No  hydronephrosis noted on ultrasound Plan: No indication for dialysis acutely S/p IV Lasix 60 mg twice daily, transitioned to Lasix 40 mg p.o. daily On 10/8 10/10 creatinine slightly elevated, discontinued Lasix but patient had received a dose today Salt and fluid restrict Hold nephrotoxins TTE LVEF 60-65%, no wall motion abnormality. 10/11 started Lasix 20 mg every other day as per nephrology 10/13 Cr 3.25, some improvement, continue to monitor  Acute urinary retention Following admission, patient could not produce a urine specimen and In-N-Out cath yielded 800 mL.  Significant for glucosuria and significant proteinuria Plan: Flomax 0.4 mg daily ordered   Leukocytosis Possible respiratory tract infection versus urinary tract infection Hypoxia/acute respiratory failure with hypoxia Full-thickness left hip wound with drainage WBC 14,000, with low-grade temp of 99.1 on arrival, with mild hypoxia to 88% in the ED requiring O2 at 2 L.  WOC noted left hip wound with foul-smelling drainage Chest x-ray showing hazy airspace disease at bases Urinalysis bland MRI hip no evidence of osteo Plan: Wound culture growing MSSA and strep mitis/oralis S/p vancomycin and Zosyn, 10/10 started cefazolin and Flagyl as per sensitivity report Pharmacy was consulted for antibiotics Appreciate surgical follow-up.  No operating room debridement indicated at this time 10/13 WBC 10.9 decreased, procalcitonin level 0.44 negative    Essential hypertension Suboptimal control Continue losartan 100 mg pod, hydralazine 50 mg po TID S/p amlodipine 10 mg daily, d/c'd on 10/9 and started clonidine 0.2 mg p.o. twice daily S/p Lasix 40 mg p.o. daily x 3 days, d/c'd on 10/10 after am dose  10/10 still has high blood pressure and bradycardia, discontinued Cardizem CD 360 mg home dose,  Started amlodipine 10  mg p.o. daily and changed to clonidine 0.1 mg p.o. 3 times daily with holding parameters 10/13 started Imdur 30 mg  p.o. nightly if SBP greater than 130 mmHg We will continue to monitor BP and titrate medications accordingly   Iron deficiency anemia Anemia of CKD stage V 10/11 Hemoglobin 8.8 slightly low Continue to monitor and transfuse if necessary Currently no indication for transfusion Monitor and transfuse as necessary.   Goal hemoglobin greater than 7  Chronic obstructive pulmonary disease Not acutely exacerbated Continue home inhalers DuoNebs as needed   Schizoaffective disorder, bipolar type Continue home meds    Diabetes mellitus, type 2 Sliding scale insulin coverage   Tobacco use disorder Nicotine patch   Body mass index is 21.77 kg/m.  Interventions:  Pressure Injury 05/08/23 Thigh Anterior;Left;Proximal Unstageable - Full thickness tissue loss in which the base of the injury is covered by slough (yellow, tan, gray, green or brown) and/or eschar (tan, brown or black) in the wound bed. slough covered w (Active)  05/08/23 1620  Location: Thigh  Location Orientation: Anterior;Left;Proximal  Staging: Unstageable - Full thickness tissue loss in which the base of the injury is covered by slough (yellow, tan, gray, green or brown) and/or eschar (tan, brown or black) in the wound bed.  Wound Description (Comments): slough covered wound  Present on Admission: Yes  Dressing Type Foam - Lift dressing to assess site every shift;Gauze (Comment);Dakin's-soaked gauze 05/14/23 2100     Diet: Regular diet DVT Prophylaxis: Subcutaneous Heparin    Advance goals of care discussion: Full code  Family Communication: family was not present at bedside, at the time of interview.  The pt provided permission to discuss medical plan with the family. Opportunity was given to ask question and all questions were answered satisfactorily.   Disposition:  Pt is from group home, admitted with anasarca, [name], left hip ulcer, s/p debridement still on IV antibiotics, and elevated creatinine, which  precludes a safe discharge. Discharge to SNF in 1 to 2 days Follow TOC for disposition plan   Subjective: No significant events overnight, pain is well-controlled, patient denies any complaint, stated that she feels the same.   Physical Exam: General: NAD, lying comfortably Appear in no distress, affect appropriate Eyes: PERRLA ENT: Oral Mucosa Clear, moist  Neck: no JVD,  Cardiovascular: S1 and S2 Present, no Murmur,  Respiratory: good respiratory effort, Bilateral Air entry equal and Decreased, no Crackles, no wheezes Abdomen: Bowel Sound present, Soft and no tenderness,  Skin: Left Hip ulcer status post debridement, dressing CDI Extremities: no Pedal edema, no calf tenderness Neurologic: without any new focal findings Gait not checked due to patient safety concerns  Vitals:   05/14/23 2055 05/15/23 0524 05/15/23 0600 05/15/23 0745  BP: (!) 146/78 (!) 178/83 (!) 168/84 (!) 182/86  Pulse: 67 (!) 55  61  Resp: 12 16  17   Temp: 97.8 F (36.6 C) (!) 97.4 F (36.3 C)  97.7 F (36.5 C)  TempSrc: Oral Oral  Oral  SpO2: 98% 99%  99%  Weight:      Height:        Intake/Output Summary (Last 24 hours) at 05/15/2023 1332 Last data filed at 05/15/2023 0859 Gross per 24 hour  Intake 799.14 ml  Output --  Net 799.14 ml    Filed Weights   05/07/23 1618 05/09/23 0500 05/10/23 0433  Weight: 49 kg 53.4 kg 54 kg    Data Reviewed: I have personally reviewed and interpreted daily labs, tele strips, imagings  as discussed above. I reviewed all nursing notes, pharmacy notes, vitals, pertinent old records I have discussed plan of care as described above with RN and patient/family.  CBC: Recent Labs  Lab 05/09/23 0819 05/10/23 0838 05/12/23 0519 05/13/23 0358 05/14/23 0526 05/15/23 0618  WBC 15.7* 13.3* 9.1 11.0* 12.8* 10.9*  NEUTROABS 12.0* 10.7*  --   --   --   --   HGB 9.1* 9.5* 8.7* 8.8* 8.7* 8.6*  HCT 29.5* 30.3* 27.8* 29.2* 29.4* 28.5*  MCV 91.9 90.7 89.7 94.2 94.5  94.4  PLT 541* 578* 594* 639* 601* 552*   Basic Metabolic Panel: Recent Labs  Lab 05/09/23 0819 05/10/23 0838 05/12/23 0519 05/13/23 0358 05/14/23 0526 05/15/23 0618  NA 140 138 138 140 140 139  K 4.9 4.6 4.3 4.2 5.0 4.9  CL 102 100 98 101 106 105  CO2 27 24 27 26 26 23   GLUCOSE 159* 176* 150* 138* 128* 115*  BUN 30* 34* 42* 46* 42* 38*  CREATININE 2.70* 2.80* 3.15* 3.38* 3.44* 3.25*  CALCIUM 9.2 9.1 8.9 9.2 9.4 9.4  MG 2.0  --  2.4 2.6*  --   --   PHOS  --   --  5.1* 4.2  --   --     Studies: No results found.  Scheduled Meds:  amLODipine  10 mg Oral Daily   vitamin C  500 mg Oral BID   aspirin EC  81 mg Oral Daily   buPROPion ER  100 mg Oral Daily   calcitRIOL  0.25 mcg Oral Daily   cloNIDine  0.1 mg Oral TID   escitalopram  20 mg Oral Daily   feeding supplement  237 mL Oral BID BM   fluticasone furoate-vilanterol  1 puff Inhalation Daily   And   umeclidinium bromide  1 puff Inhalation Daily   furosemide  20 mg Oral QODAY   heparin  5,000 Units Subcutaneous Q8H   hydrALAZINE  50 mg Oral TID   insulin aspart  0-5 Units Subcutaneous QHS   insulin aspart  0-9 Units Subcutaneous TID WC   losartan  100 mg Oral Daily   melatonin  5 mg Oral QHS   multivitamin with minerals  1 tablet Oral Daily   nicotine  21 mg Transdermal Daily   potassium chloride  40 mEq Oral Daily   tamsulosin  0.4 mg Oral Daily   zinc sulfate  220 mg Oral Daily   Continuous Infusions:   ceFAZolin (ANCEF) IV 2 g (05/15/23 0923)   metronidazole 500 mg (05/15/23 1001)   PRN Meds: acetaminophen **OR** acetaminophen, albuterol, guaiFENesin-dextromethorphan, labetalol, lip balm, ondansetron **OR** ondansetron (ZOFRAN) IV, traMADol  Time spent: 35 minutes  Author: Gillis Santa. MD [facility] 05/15/2023 1:32 PM  To reach On-call, see care teams to locate the attending and reach out to them via [email or web address]. If 7PM-7AM, please contact night-coverage If you still have difficulty  reaching the attending provider, please page the [facility] (Director on Call) for [facility] on amion for assistance.

## 2023-05-15 NOTE — Plan of Care (Signed)
  Problem: Education: Goal: Ability to demonstrate management of disease process will improve Outcome: Progressing   Problem: Activity: Goal: Capacity to carry out activities will improve Outcome: Progressing   Problem: Coping: Goal: Ability to adjust to condition or change in health will improve Outcome: Progressing   Problem: Nutritional: Goal: Maintenance of adequate nutrition will improve Outcome: Progressing   Problem: Skin Integrity: Goal: Risk for impaired skin integrity will decrease Outcome: Progressing   Problem: Tissue Perfusion: Goal: Adequacy of tissue perfusion will improve Outcome: Progressing   Problem: Activity: Goal: Risk for activity intolerance will decrease Outcome: Progressing   Problem: Pain Managment: Goal: General experience of comfort will improve Outcome: Progressing

## 2023-05-15 NOTE — TOC Progression Note (Signed)
Transition of Care [facility]) - Progression Note    Patient Details  Name: Alicia Rogers MRN: [id number] Date of Birth: [birth year]  [facility] CM/SW Contact  Liliana Cline, LCSW Phone Number: 05/15/2023, 12:48 PM  Clinical Narrative:    CSW started insurance auth in [facility] Portal today for [facility].        Expected Discharge Plan and Services                                               Social Determinants of Health (SDOH) Interventions SDOH Screenings   Food Insecurity: No Food Insecurity (03/04/2023)   Received from [facility]  Housing: Patient Declined (08/30/2022)  Transportation Needs: No Transportation Needs (03/04/2023)   Received from [facility]  Utilities: Not At Risk (03/04/2023)   Received from [facility]  Alcohol Screen: Low Risk  (09/29/2021)  Financial Resource Strain: Medium Risk (03/04/2023)   Received from [facility]  Social Connections: Unknown (09/28/2022)   Received from [facility]  Tobacco Use: High Risk (05/07/2023)    Readmission Risk Interventions    05/10/2023   10:05 AM  Readmission Risk Prevention Plan  Transportation Screening Complete  HRI or Home Care Consult Complete  Palliative Care Screening Complete  Medication Review (RN Care Manager) Complete

## 2023-05-16 DIAGNOSIS — R601 Generalized edema: Secondary | ICD-10-CM | POA: Diagnosis not present

## 2023-05-16 LAB — BASIC METABOLIC PANEL
Anion gap: 11 (ref 5–15)
BUN: 39 mg/dL — ABNORMAL HIGH (ref 8–23)
CO2: 23 mmol/L (ref 22–32)
Calcium: 9.7 mg/dL (ref 8.9–10.3)
Chloride: 107 mmol/L (ref 98–111)
Creatinine, Ser: 3.36 mg/dL — ABNORMAL HIGH (ref 0.44–1.00)
GFR, Estimated: 15 mL/min — ABNORMAL LOW (ref >60)
Glucose, Bld: 110 mg/dL — ABNORMAL HIGH (ref 70–99)
Potassium: 5.2 mmol/L — ABNORMAL HIGH (ref 3.5–5.1)
Sodium: 141 mmol/L (ref 135–145)

## 2023-05-16 LAB — CBC
HCT: 28.2 % — ABNORMAL LOW (ref 36.0–46.0)
Hemoglobin: 8.6 g/dL — ABNORMAL LOW (ref 12.0–15.0)
MCH: 29.2 pg (ref 26.0–34.0)
MCHC: 30.5 g/dL (ref 30.0–36.0)
MCV: 95.6 fL (ref 80.0–100.0)
Platelets: 486 10*3/uL — ABNORMAL HIGH (ref 150–400)
RBC: 2.95 MIL/uL — ABNORMAL LOW (ref 3.87–5.11)
RDW: 14.8 % (ref 11.5–15.5)
WBC: 10.1 10*3/uL (ref 4.0–10.5)
nRBC: 0.2 % (ref 0.0–0.2)

## 2023-05-16 LAB — GLUCOSE, CAPILLARY
Glucose-Capillary: 103 mg/dL — ABNORMAL HIGH (ref 70–99)
Glucose-Capillary: 203 mg/dL — ABNORMAL HIGH (ref 70–99)

## 2023-05-16 MED ORDER — HYDRALAZINE HCL 50 MG PO TABS: 50.0000 mg | ORAL_TABLET | Freq: Three times a day (TID) | ORAL | Status: DC

## 2023-05-16 MED ORDER — SODIUM ZIRCONIUM CYCLOSILICATE 10 G PO PACK
10.0000 g | PACK | Freq: Once | ORAL | Status: AC
  Administered 2023-05-16: 10 g via ORAL
  Filled 2023-05-16: qty 1

## 2023-05-16 MED ORDER — ISOSORBIDE MONONITRATE ER 60 MG PO TB24: 60.0000 mg | ORAL_TABLET | Freq: Every day | ORAL | Status: DC

## 2023-05-16 MED ORDER — COLLAGENASE 250 UNIT/GM EX OINT: TOPICAL_OINTMENT | Freq: Every day | CUTANEOUS | Status: DC

## 2023-05-16 MED ORDER — FUROSEMIDE 20 MG PO TABS: 20.0000 mg | ORAL_TABLET | ORAL | Status: DC

## 2023-05-16 MED ORDER — MELATONIN 3 MG PO TABS: 6.0000 mg | ORAL_TABLET | Freq: Every evening | ORAL | Status: DC | PRN

## 2023-05-16 MED ORDER — ASCORBIC ACID 500 MG PO TABS: 500.0000 mg | ORAL_TABLET | Freq: Every day | ORAL | Status: AC

## 2023-05-16 MED ORDER — BLISTEX MEDICATED EX OINT: 1.0000 | TOPICAL_OINTMENT | CUTANEOUS | Status: DC | PRN

## 2023-05-16 MED ORDER — ENSURE ENLIVE PO LIQD: 237.0000 mL | Freq: Two times a day (BID) | ORAL | Status: DC

## 2023-05-16 MED ORDER — LOSARTAN POTASSIUM 100 MG PO TABS: 100.0000 mg | ORAL_TABLET | Freq: Every day | ORAL | Status: DC

## 2023-05-16 MED ORDER — ISOSORBIDE MONONITRATE ER 30 MG PO TB24: 60.0000 mg | ORAL_TABLET | Freq: Every day | ORAL | Status: DC

## 2023-05-16 MED ORDER — ZINC SULFATE 220 (50 ZN) MG PO CAPS: 220.0000 mg | ORAL_CAPSULE | Freq: Every day | ORAL | Status: AC

## 2023-05-16 MED ORDER — CEFADROXIL 500 MG PO CAPS
500.0000 mg | ORAL_CAPSULE | Freq: Two times a day (BID) | ORAL | Status: AC
Start: 2023-05-16 — End: 2023-05-23

## 2023-05-16 MED ORDER — CLONIDINE HCL 0.1 MG PO TABS: 0.1000 mg | ORAL_TABLET | Freq: Three times a day (TID) | ORAL | Status: DC

## 2023-05-16 MED ORDER — AMLODIPINE BESYLATE 10 MG PO TABS: 10.0000 mg | ORAL_TABLET | Freq: Every day | ORAL | Status: DC

## 2023-05-16 NOTE — Progress Notes (Signed)
[facility]  ROUNDING NOTE   Subjective:   [name] was admitted to [facility] on 05/07/2023 for Anasarca [R60.1] Peripheral edema [R60.0] [name] (acute kidney injury) (HCC) [N17.9] Acute hypoxemic respiratory failure (HCC) [J96.01] Acute on chronic congestive heart failure, unspecified heart failure type [facility]) [I50.9]  Patient was seen by Dr. Thedore Mins on 9/4 where she was referred for vein mapping for dialysis planning.   Patient sitting up in bed Nursing at bedside with morning med pass Alert States she feels well Mild discomfort from hips  Creatinine 3.36  Objective:  Vital signs in last 24 hours:  Temp:  [97.6 F (36.4 C)] 97.6 F (36.4 C) (10/13 2011) Pulse Rate:  [49-86] 51 (10/14 0729) Resp:  [17-18] 18 (10/14 0729) BP: (132-172)/(60-72) 162/72 (10/14 0729) SpO2:  [100 %] 100 % (10/14 0729)  Weight change:  Filed Weights   05/07/23 1618 05/09/23 0500 05/10/23 0433  Weight: 49 kg 53.4 kg 54 kg    Intake/Output: I/O last 3 completed shifts: In: 1439.1 [P.O.:440; IV Piggyback:999.1] Out: -    Intake/Output this shift:  No intake/output data recorded.  Physical Exam: General: NAD  Head: Normocephalic, atraumatic. Moist oral mucosal membranes  Eyes: Anicteric  Lungs:  Clear to auscultation  Heart: Regular rate and rhythm  Abdomen:  Soft, nontender  Extremities: No peripheral edema.  Neurologic: Alert, moving all four extremities  Skin: No lesions  Access: none    Basic Metabolic Panel: Recent Labs  Lab 05/12/23 0519 05/13/23 0358 05/14/23 0526 05/15/23 0618 05/16/23 0545  NA 138 140 140 139 141  K 4.3 4.2 5.0 4.9 5.2*  CL 98 101 106 105 107  CO2 27 26 26 23 23   GLUCOSE 150* 138* 128* 115* 110*  BUN 42* 46* 42* 38* 39*  CREATININE 3.15* 3.38* 3.44* 3.25* 3.36*  CALCIUM 8.9 9.2 9.4 9.4 9.7  MG 2.4 2.6*  --   --   --   PHOS 5.1* 4.2  --   --   --     Liver Function Tests: No results for input(s): "AST", "ALT", "ALKPHOS",  "BILITOT", "PROT", "ALBUMIN" in the last 168 hours.  No results for input(s): "LIPASE", "AMYLASE" in the last 168 hours. No results for input(s): "AMMONIA" in the last 168 hours.  CBC: Recent Labs  Lab 05/10/23 0838 05/12/23 0519 05/13/23 0358 05/14/23 0526 05/15/23 0618 05/16/23 0545  WBC 13.3* 9.1 11.0* 12.8* 10.9* 10.1  NEUTROABS 10.7*  --   --   --   --   --   HGB 9.5* 8.7* 8.8* 8.7* 8.6* 8.6*  HCT 30.3* 27.8* 29.2* 29.4* 28.5* 28.2*  MCV 90.7 89.7 94.2 94.5 94.4 95.6  PLT 578* 594* 639* 601* 552* 486*    Cardiac Enzymes: No results for input(s): "CKTOTAL", "CKMB", "CKMBINDEX", "TROPONINI" in the last 168 hours.  BNP: Invalid input(s): "POCBNP"  CBG: Recent Labs  Lab 05/15/23 1202 05/15/23 1554 05/15/23 2242 05/16/23 0730 05/16/23 1210  GLUCAP 157* 103* 115* 103* 203*    Microbiology: Results for orders placed or performed during the hospital encounter of 05/07/23  Resp panel by RT-PCR (RSV, Flu A&B, Covid) Anterior Nasal Swab     Status: None   Collection Time: 05/07/23 10:26 PM   Specimen: Anterior Nasal Swab  Result Value Ref Range Status   SARS Coronavirus 2 by RT PCR NEGATIVE NEGATIVE Final    Comment: (NOTE) SARS-CoV-2 target nucleic acids are NOT DETECTED.  The SARS-CoV-2 RNA is generally detectable in upper respiratory specimens  during the acute phase of infection. The lowest concentration of SARS-CoV-2 viral copies this assay can detect is 138 copies/mL. A negative result does not preclude SARS-Cov-2 infection and should not be used as the sole basis for treatment or other patient management decisions. A negative result may occur with  improper specimen collection/handling, submission of specimen other than nasopharyngeal swab, presence of viral mutation(s) within the areas targeted by this assay, and inadequate number of viral copies(<138 copies/mL). A negative result must be combined with clinical observations, patient history, and  epidemiological information. The expected result is Negative.  Fact Sheet for Patients:  BloggerCourse.com  Fact Sheet for Healthcare Providers:  SeriousBroker.it  This test is no t yet approved or cleared by the [place] FDA and  has been authorized for detection and/or diagnosis of SARS-CoV-2 by FDA under an Emergency Use Authorization (EUA). This EUA will remain  in effect (meaning this test can be used) for the duration of the COVID-19 declaration under Section 564(b)(1) of the Act, 21 U.S.C.section 360bbb-3(b)(1), unless the authorization is terminated  or revoked sooner.       Influenza A by PCR NEGATIVE NEGATIVE Final   Influenza B by PCR NEGATIVE NEGATIVE Final    Comment: (NOTE) The Xpert Xpress SARS-CoV-2/FLU/RSV plus assay is intended as an aid in the diagnosis of influenza from Nasopharyngeal swab specimens and should not be used as a sole basis for treatment. Nasal washings and aspirates are unacceptable for Xpert Xpress SARS-CoV-2/FLU/RSV testing.  Fact Sheet for Patients: BloggerCourse.com  Fact Sheet for Healthcare Providers: SeriousBroker.it  This test is not yet approved or cleared by the [place] FDA and has been authorized for detection and/or diagnosis of SARS-CoV-2 by FDA under an Emergency Use Authorization (EUA). This EUA will remain in effect (meaning this test can be used) for the duration of the COVID-19 declaration under Section 564(b)(1) of the Act, 21 U.S.C. section 360bbb-3(b)(1), unless the authorization is terminated or revoked.     Resp Syncytial Virus by PCR NEGATIVE NEGATIVE Final    Comment: (NOTE) Fact Sheet for Patients: BloggerCourse.com  Fact Sheet for Healthcare Providers: SeriousBroker.it  This test is not yet approved or cleared by the [place] FDA and has been  authorized for detection and/or diagnosis of SARS-CoV-2 by FDA under an Emergency Use Authorization (EUA). This EUA will remain in effect (meaning this test can be used) for the duration of the COVID-19 declaration under Section 564(b)(1) of the Act, 21 U.S.C. section 360bbb-3(b)(1), unless the authorization is terminated or revoked.  Performed at [facility], [address] [place], [place] [address]   Aerobic/Anaerobic Culture w Gram Stain (surgical/deep wound)     Status: None   Collection Time: 05/09/23 11:30 AM   Specimen: Wound  Result Value Ref Range Status   Specimen Description   Final    WOUND Performed at [facility], [address], [place], [place] [address]    Special Requests   Final    LEFT HIP Performed at [facility], [address] [place], [place] [address]    Gram Stain   Final    RARE WBC PRESENT, PREDOMINANTLY PMN MODERATE GRAM NEGATIVE RODS MODERATE GRAM POSITIVE COCCI IN PAIRS    Culture   Final    ABUNDANT STAPHYLOCOCCUS AUREUS ABUNDANT STREPTOCOCCUS MITIS/ORALIS ABUNDANT BACTEROIDES FRAGILIS BETA LACTAMASE POSITIVE Performed at [facility], [address] [place], [place] [address]    Report Status 05/12/2023 FINAL  Final   Organism  ID, Bacteria STAPHYLOCOCCUS AUREUS  Final   Organism ID, Bacteria STREPTOCOCCUS MITIS/ORALIS  Final      Susceptibility   Staphylococcus aureus - MIC*    CIPROFLOXACIN <=0.5 SENSITIVE Sensitive     ERYTHROMYCIN 4 INTERMEDIATE Intermediate     GENTAMICIN <=0.5 SENSITIVE Sensitive     OXACILLIN <=0.25 SENSITIVE Sensitive     TETRACYCLINE <=1 SENSITIVE Sensitive     VANCOMYCIN 1 SENSITIVE Sensitive     TRIMETH/SULFA <=10 SENSITIVE Sensitive     CLINDAMYCIN <=0.25 SENSITIVE Sensitive     RIFAMPIN <=0.5 SENSITIVE Sensitive     Inducible Clindamycin NEGATIVE Sensitive     LINEZOLID 2 SENSITIVE Sensitive     * ABUNDANT STAPHYLOCOCCUS AUREUS   Streptococcus mitis/oralis - MIC*     TETRACYCLINE >=16 RESISTANT Resistant     VANCOMYCIN 0.5 SENSITIVE Sensitive     CLINDAMYCIN >=1 RESISTANT Resistant     * ABUNDANT STREPTOCOCCUS MITIS/ORALIS    Coagulation Studies: No results for input(s): "LABPROT", "INR" in the last 72 hours.  Urinalysis: No results for input(s): "COLORURINE", "LABSPEC", "PHURINE", "GLUCOSEU", "HGBUR", "BILIRUBINUR", "KETONESUR", "PROTEINUR", "UROBILINOGEN", "NITRITE", "LEUKOCYTESUR" in the last 72 hours.  Invalid input(s): "APPERANCEUR"     Imaging: No results found.   Medications:     ceFAZolin (ANCEF) IV 2 g (05/16/23 1610)   metronidazole 500 mg (05/16/23 1035)    amLODipine  10 mg Oral Daily   vitamin C  500 mg Oral BID   aspirin EC  81 mg Oral Daily   buPROPion ER  100 mg Oral Daily   calcitRIOL  0.25 mcg Oral Daily   cloNIDine  0.1 mg Oral TID   collagenase   Topical Daily   escitalopram  20 mg Oral Daily   feeding supplement  237 mL Oral BID BM   fluticasone furoate-vilanterol  1 puff Inhalation Daily   And   umeclidinium bromide  1 puff Inhalation Daily   furosemide  20 mg Oral QODAY   heparin  5,000 Units Subcutaneous Q8H   hydrALAZINE  50 mg Oral TID   insulin aspart  0-5 Units Subcutaneous QHS   insulin aspart  0-9 Units Subcutaneous TID WC   isosorbide mononitrate  60 mg Oral QHS   losartan  100 mg Oral Daily   melatonin  5 mg Oral QHS   multivitamin with minerals  1 tablet Oral Daily   nicotine  21 mg Transdermal Daily   tamsulosin  0.4 mg Oral Daily   zinc sulfate  220 mg Oral Daily   acetaminophen **OR** acetaminophen, albuterol, guaiFENesin-dextromethorphan, labetalol, lip balm, ondansetron **OR** ondansetron (ZOFRAN) IV, traMADol  Assessment/ Plan:  [name] is a 62 y.o.  female with schizophrenia, diabetes mellitus type II, hypertension, tobacco use, hyperlipidemia who is admitted to [facility] on 05/07/2023 for Anasarca [R60.1] Peripheral edema [R60.0] [name] (acute kidney injury) (HCC)  [N17.9] Acute hypoxemic respiratory failure (HCC) [J96.01] Acute on chronic congestive heart failure, unspecified heart failure type (HCC) [I50.9]  Chronic Kidney Disease stage V with nephrotic range proteinuria: baseline creatinine 3.42, GFR of 14 on 8/5. Chronic kidney disease secondary to diabetic nephropathy.  Renal ultrasound negative for hydronephrosis.  - Decreased muscle mass may be affecting creatinine.  - Creatinine stable, at baseline - Continue oral furosemide 20mg  every other day - Continue sodium and fluid restriction to manage edema - Holding dapagliflozin, hydrochlorothiazide and sodium bicarbonate.    Hypertension: with suspected acute exacerbation of congestive heart failure. Volume driven. ECHO EF shows 60-65% -Now receiving  clonidine, amlodipine, furosemide, hydralazine, tamsulosin and losartan -Continue holding HCTZ  Diabetes mellitus type II with chronic kidney disease: noninsulin dependent.  - Holding dapagliflozin - Glucose elevated at times  Secondary Hyperparathyroidism:  - continue calcitriol -Calcium acceptable, 9.7   LOS: 9   10/14/20241:25 PM

## 2023-05-16 NOTE — Progress Notes (Addendum)
CROSS COVER NOTE  NAME: Alicia Rogers MRN: [id number] DOB : 1960-08-24    Concern as stated by nurse / staff   Patient is bleeding out her vagina. Period bleed. no complaints of pain  I wiped her down and it coming from her vagina. pt states she has been bleeding since being in the hospital but no reports of any bleeding in report or note. 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 Bleeding is scant. VS @ 0500 BP:172/66 , HR: 50, O2 100% RA. She is here for fluid overload      Pertinent findings on chart review:   Assessment and  Interventions   Assessment:  Vaginal bleeding, scant  Plan: Continue to monitor Consider GYN consult if worsening versus outpatient referral X X

## 2023-05-16 NOTE — Plan of Care (Addendum)
Pt experiencing vaginal bleeding this shift. VS stable. No complaints of pain. No new interventions.   Problem: Education: Goal: Ability to demonstrate management of disease process will improve Outcome: Completed/Met Goal: Ability to verbalize understanding of medication therapies will improve Outcome: Completed/Met Goal: Individualized Educational Video(s) Outcome: Completed/Met   Problem: Activity: Goal: Capacity to carry out activities will improve Outcome: Completed/Met   Problem: Cardiac: Goal: Ability to achieve and maintain adequate cardiopulmonary perfusion will improve Outcome: Completed/Met   Problem: Education: Goal: Ability to describe self-care measures that may prevent or decrease complications (Diabetes Survival Skills Education) will improve Outcome: Completed/Met Goal: Individualized Educational Video(s) Outcome: Completed/Met   Problem: Coping: Goal: Ability to adjust to condition or change in health will improve Outcome: Completed/Met   Problem: Fluid Volume: Goal: Ability to maintain a balanced intake and output will improve Outcome: Completed/Met   Problem: Health Behavior/Discharge Planning: Goal: Ability to identify and utilize available resources and services will improve Outcome: Completed/Met Goal: Ability to manage health-related needs will improve Outcome: Completed/Met   Problem: Metabolic: Goal: Ability to maintain appropriate glucose levels will improve Outcome: Completed/Met   Problem: Nutritional: Goal: Maintenance of adequate nutrition will improve Outcome: Completed/Met Goal: Progress toward achieving an optimal weight will improve Outcome: Completed/Met   Problem: Skin Integrity: Goal: Risk for impaired skin integrity will decrease Outcome: Completed/Met   Problem: Tissue Perfusion: Goal: Adequacy of tissue perfusion will improve Outcome: Completed/Met   Problem: Education: Goal: Knowledge of General Education information  will improve Description: Including pain rating scale, medication(s)/side effects and non-pharmacologic comfort measures Outcome: Completed/Met   Problem: Health Behavior/Discharge Planning: Goal: Ability to manage health-related needs will improve Outcome: Completed/Met   Problem: Clinical Measurements: Goal: Ability to maintain clinical measurements within normal limits will improve Outcome: Completed/Met Goal: Will remain free from infection Outcome: Completed/Met Goal: Diagnostic test results will improve Outcome: Completed/Met Goal: Respiratory complications will improve Outcome: Completed/Met Goal: Cardiovascular complication will be avoided Outcome: Completed/Met   Problem: Activity: Goal: Risk for activity intolerance will decrease Outcome: Completed/Met   Problem: Nutrition: Goal: Adequate nutrition will be maintained Outcome: Completed/Met   Problem: Coping: Goal: Level of anxiety will decrease Outcome: Completed/Met   Problem: Elimination: Goal: Will not experience complications related to bowel motility Outcome: Completed/Met Goal: Will not experience complications related to urinary retention Outcome: Completed/Met   Problem: Pain Managment: Goal: General experience of comfort will improve Outcome: Completed/Met   Problem: Safety: Goal: Ability to remain free from injury will improve Outcome: Completed/Met   Problem: Skin Integrity: Goal: Risk for impaired skin integrity will decrease Outcome: Completed/Met   Problem: Education: Goal: Individualized Educational Video(s) Outcome: Completed/Met   Problem: Health Behavior/Discharge Planning: Goal: Ability to identify and utilize available resources and services will improve Outcome: Completed/Met   Problem: Tissue Perfusion: Goal: Adequacy of tissue perfusion will improve Outcome: Completed/Met

## 2023-05-16 NOTE — Discharge Summary (Signed)
[facility] Discharge Summary   Patient: Alicia Rogers RUE:[id number]  PCP: Patient, No Pcp Per  Date of admission: 05/07/2023   Date of discharge:  05/16/2023     Discharge Diagnoses:  Principal Problem:   Anasarca Active Problems:   Hypoxia   Leukocytosis   Acute urinary retention   Iron deficiency anemia   CKD (chronic kidney disease) stage 5, GFR less than 15 ml/min (HCC)   Tobacco use disorder   Diabetes mellitus, type 2 (HCC)   Essential hypertension   Schizoaffective disorder, bipolar type (HCC)   MGUS (monoclonal gammopathy of unknown significance)   Chronic obstructive pulmonary disease (HCC)   Bilateral pleural effusion   Bilateral lower extremity edema   [name] (acute kidney injury) (HCC)   Pressure injury of hip, unstageable (HCC)   Malnutrition of moderate degree   Admitted From: Group Home Disposition:  SNF   Recommendations for Outpatient Follow-up:  Follow-up with PCP, patient should be seen by an MD in 1 to 2 days, continue monitor BP and titrate medication accordingly.  Did multiple changes in the antihypertensive medications. Follow-up with nephrology in 1 to 2 weeks as an outpatient, repeat BMP after 1 week Follow-up with GYN as an outpatient if persistent vaginal bleeding. Follow with general surgery if needed for left hip wound.  Continue dressing. Follow-up with PCP for lung cancer screening Follow up LABS/TEST:  BMP in 1 wk   Contact information for follow-up providers     Simon Rhein, MD Follow up.   Specialty: Endocrinology Why: Hospital follow up Contact information: [address] [place] [address] [phone]              Contact information for after-discharge care     Destination     [facility] Preferred SNF .   Service: Skilled Nursing Contact information: [address] [place] [address] [phone]                    Diet recommendation:  Diabetic diet  Activity: The patient is advised to gradually reintroduce usual activities, as tolerated  Discharge Condition: stable  Code Status: Full code   History of present illness: As per the H and P dictated on admission Hospital Course:  62 y.o. 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  Patient who ambulates with a walker at  baseline. has had no orthopnea or shortness of breath no complaints of chest pain.  Patient was last seen by her nephrologist on 04/07/23 when she was noted to have lower extremity edema and her torsemide was replaced with furosemide at that time. Denies cough, fever or chills or leg pain. Endorses dysuria/pain with urination.  Of note, during hospitalization in January 2024, patient was noted to have a pulmonary nodule which showed complete CT resolution by the time of pulmonology follow-up 03/2023.  Pulmonologist did express some concern for aspiration for which barium swallow was ordered, (not performed) for ongoing cough however patient currently denies  cough    Seen in consultation by nephrology   WOC involved for evaluation of bilateral hip  wounds.  Noted to have unstageable left hip/trochanter wound with foul-smelling tan-colored drainage.  Recommend surgical evaluation.  Surgical consult requested.     Assessment and Plan: # Anasarca, Resolved  # Stage V CKD with nephrotic range proteinuria # Small bilateral pleural effusions Suspect anasarca is renal and not cardiac. BNP elevated over 400  No hydronephrosis  noted on ultrasound. No indication for dialysis acutely as per nephro. S/p IV Lasix 60 mg twice daily, transitioned to Lasix 40 mg p.o. daily On 10/8 10/10 creatinine slightly elevated, discontinued Lasix but patient had received a dose in am. Continue Salt and fluid restrict, avoid nephrotoxins. TTE LVEF 60-65%, no wall motion abnormality. 10/11 started Lasix 20 mg every other day as per nephrology 10/14 Cr 3.36 stable.  Repeat BMP after 1 week and follow with nephrology as an outpatient. # Acute urinary retention: Following admission, patient could not produce a urine specimen and In-N-Out cath yielded 800 mL.  Significant for glucosuria and significant proteinuria. S/p Flomax 0.4 mg po daily, patient is voiding well.  Retention resolved.  Discontinued oxybutynin. # Full-thickness left hip wound with drainage # Possible respiratory tract infection versus urinary tract infection # Hypoxia/acute respiratory failure with hypoxia, resolved, saturating well on room air WBC 14,000, with low-grade temp of 99.1 on arrival, with mild hypoxia to 88% in the ED requiring O2 at 2 L.  WOC noted left hip wound with foul-smelling drainage.  Leukocytosis, resolved Chest x-ray showing hazy airspace disease at bases. Urinalysis bland MRI hip no evidence of osteo. Wound culture growing MSSA and strep mitis/oralis S/p vancomycin and Zosyn, 10/10 started cefazolin and Flagyl as per sensitivity report. Pharmacy was consulted for antibiotics. Appreciate surgical follow-up.  No operating room debridement indicated at this time. 10/14 WBC 10.1 wnl, procalcitonin level 0.44 negative.  Started cefadroxil 500 mg p.o. twice daily for 7 days.  Continue dressing daily and follow-up with general surgery as an outpatient if no improvement in the healing. # Essential hypertension. Continue losartan 100 mg pod, hydralazine 50 mg po TID S/p amlodipine 10 mg daily, d/c'd on 10/9 and started clonidine 0.2 mg p.o. twice daily S/p Lasix  40 mg p.o. daily x 3 days, d/c'd on 10/10 after am dose  10/10 still has high blood pressure and bradycardia, discontinued Cardizem CD 360 mg home dose,  Started amlodipine 10 mg p.o. daily and changed to clonidine 0.1 mg p.o. 3 times daily with holding parameters. 10/13 started Imdur 30 mg p.o. nightly if SBP greater than 130 mmHg and increased Imdur 60 mg p.o. nightly today. continue to monitor BP and titrate medications accordingly. # Iron deficiency anemia, Anemia of CKD stage V 10/14 Hemoglobin 8.6 slightly low, but stable.  Continue oral iron supplement with vitamin C # Chronic obstructive pulmonary disease: Not acutely exacerbated, Continue home inhalers DuoNebs as needed # Schizoaffective disorder, bipolar type: Continue home meds # Diabetes mellitus, type 2, s/p sliding scale insulin coverage, resumed home meds # Tobacco use disorder: s/p Nicotine patch.  Smoking cessation counseling done   Body mass index is 21.77 kg/m.  Nutrition Problem: Moderate Malnutrition Etiology: chronic illness (CKD) Nutrition Interventions: Interventions: Ensure Enlive (each supplement provides 350kcal and 20 grams of protein), MVI  Pressure Injury 05/08/23 Thigh Anterior;Left;Proximal Unstageable - Full thickness tissue loss in which the base of the injury is covered by slough (yellow, tan, gray, green or brown) and/or eschar (tan, brown or black) in the wound bed. slough covered w (Active)  05/08/23 1620  Location: Thigh  Location Orientation: Anterior;Left;Proximal  Staging: Unstageable - Full thickness tissue loss in which the base of the injury is covered by slough (yellow, tan, gray, green or brown) and/or eschar (tan, brown or black) in the wound bed.  Wound Description (Comments): slough covered wound  Present on Admission: Yes  Dressing Type Foam - Lift dressing to assess site every shift;Gauze (Comment);Tape dressing;Other (Comment) 05/15/23 1430     Patient was seen by physical therapy, who  recommended Therapy, SNF placement, which was arranged. On the day of the discharge the patient's vitals were stable, and no other acute medical condition were reported by patient. the patient was felt safe to be discharge at [facility].  Consultants: General Surgery, nephrology, palliative care Procedures: Bedside debridement of left hip wound  Discharge Exam: General: Appear in no distress, no Rash; Oral Mucosa Clear, but dry Cardiovascular: S1 and S2 Present, no Murmur, Respiratory: normal respiratory effort, Bilateral Air entry present and no Crackles, no wheezes Abdomen: Bowel Sound present, Soft and no tenderness, no hernia Extremities: no Pedal edema, no calf tenderness, left hip wound, dressing CDI Neurology: alert and oriented to time, place, and person affect appropriate.  Filed Weights   05/07/23 1618 05/09/23 0500 05/10/23 0433  Weight: 49 kg 53.4 kg 54 kg   Vitals:   05/16/23 0456 05/16/23 0729  BP: (!) 172/66 (!) 162/72  Pulse: (!) 49 (!) 51  Resp: 18 18  Temp:    SpO2: 100% 100%    DISCHARGE MEDICATION: Allergies as of 05/16/2023       Reactions   Penicillins    Rash   Lisinopril         Medication List     STOP taking these medications    calcitRIOL 0.25 MCG capsule Commonly known as: ROCALTROL   cetirizine 10 MG tablet Commonly known as: ZYRTEC   Colace 100 MG capsule Generic drug: docusate sodium   diltiazem 360 MG 24 hr capsule Commonly known as: CARDIZEM CD   guaiFENesin-dextromethorphan 100-10 MG/5ML syrup Commonly known as: ROBITUSSIN DM   hydrochlorothiazide 25 MG tablet Commonly known as: HYDRODIURIL   ketoconazole 2 % cream Commonly known as: NIZORAL   LORazepam 0.5 MG tablet Commonly known as: ATIVAN   multivitamin Tabs tablet   nicotine 21 mg/24hr patch Commonly known as: Nicoderm CQ   oxybutynin 5 MG 24 hr tablet Commonly known as: DITROPAN-XL   sodium bicarbonate 650 MG tablet   traMADol 50 MG tablet Commonly known  as: Ultram       TAKE these medications    allopurinol 100 MG tablet Commonly known as: ZYLOPRIM Take 100 mg by mouth daily.   amLODipine 10 MG tablet Commonly known as: NORVASC Take 1 tablet (10 mg total) by mouth daily. Start taking on: May 17, 2023   ascorbic acid 500 MG tablet Commonly known as: VITAMIN C Take 1 tablet (500 mg total) by mouth daily.   aspirin EC 81 MG tablet Take 81 mg by mouth daily.   Breztri Aerosphere 160-9-4.8 MCG/ACT Aero Generic drug: Budeson-Glycopyrrol-Formoterol Inhale 2 puffs into the lungs in the morning and at bedtime. Start taking after antibiotics are completed.   buPROPion 100 MG tablet Commonly known as: WELLBUTRIN Take 100 mg by mouth daily.   cefadroxil 500 MG capsule Commonly known as: DURICEF Take 1 capsule (500 mg total) by mouth 2 (two) times daily for 7 days.   cloNIDine 0.1 MG tablet Commonly known as: CATAPRES Take 1 tablet (0.1 mg total) by mouth 3 (three)  times daily.   collagenase 250 UNIT/GM ointment Commonly known as: SANTYL Apply topically daily. Start taking on: May 17, 2023   cyclobenzaprine 10 MG tablet Commonly known as: FLEXERIL Take 1 tablet (10 mg total) by mouth at bedtime. What changed:  when to take this reasons to take this   dapagliflozin propanediol 5 MG Tabs tablet Commonly known as: FARXIGA Take 5 mg by mouth daily.   ergocalciferol 1.25 MG (50000 UT) capsule Commonly known as: VITAMIN D2 Take 50,000 Units by mouth once a week. On Fridays   escitalopram 20 MG tablet Commonly known as: LEXAPRO Take 20 mg by mouth daily.   feeding supplement Liqd Take 237 mLs by mouth 2 (two) times daily between meals.   ferrous sulfate 325 (65 FE) MG tablet Take 325 mg by mouth 3 (three) times daily with meals.   fluticasone 50 MCG/ACT nasal spray Commonly known as: FLONASE Place 2 sprays into both nostrils daily.   furosemide 20 MG tablet Commonly known as: LASIX Take 1 tablet (20 mg  total) by mouth every other day. What changed: when to take this   gabapentin 100 MG capsule Commonly known as: NEURONTIN Take 100 mg by mouth 3 (three) times daily.   hydrALAZINE 50 MG tablet Commonly known as: APRESOLINE Take 1 tablet (50 mg total) by mouth 3 (three) times daily.   isosorbide mononitrate 60 MG 24 hr tablet Commonly known as: IMDUR Take 1 tablet (60 mg total) by mouth at bedtime. Hold if SBP <130   linagliptin 5 MG Tabs tablet Commonly known as: TRADJENTA Take 5 mg by mouth daily.   lip balm Oint Apply 1 Application topically as needed for lip care.   loperamide 2 MG capsule Commonly known as: IMODIUM Take 2 mg by mouth 2 (two) times daily as needed for diarrhea or loose stools.   losartan 100 MG tablet Commonly known as: COZAAR Take 1 tablet (100 mg total) by mouth daily.   melatonin 3 MG Tabs tablet Take 2 tablets (6 mg total) by mouth at bedtime as needed. What changed:  when to take this reasons to take this   multivitamin capsule Take 1 capsule by mouth daily.   omeprazole 40 MG capsule Commonly known as: PRILOSEC Take 40 mg by mouth daily.   Paliperidone ER injection Commonly known as: INVEGA SUSTENNA Inject 78 mg into the muscle every 30 (thirty) days.   polyethylene glycol powder 17 GM/SCOOP powder Commonly known as: GLYCOLAX/MIRALAX Take 17 g by mouth daily as needed.   Tylenol 325 MG tablet Generic drug: acetaminophen Take 650 mg by mouth every 8 (eight) hours as needed for mild pain. *DO NOT EXCEED 4GM OF TYLENOL IN 24 HOURS*   zinc sulfate 220 (50 Zn) MG capsule Take 1 capsule (220 mg total) by mouth daily. Start taking on: May 17, 2023               Discharge Care Instructions  (From admission, onward)           Start     Ordered   05/16/23 0000  Discharge wound care:       Comments: As above   05/16/23 1343           Allergies  Allergen Reactions   Penicillins     Rash   Lisinopril     Discharge Instructions     Call MD for:  difficulty breathing, headache or visual disturbances   Complete by: As directed    Call  MD for:  extreme fatigue   Complete by: As directed    Call MD for:  persistant dizziness or light-headedness   Complete by: As directed    Call MD for:  severe uncontrolled pain   Complete by: As directed    Call MD for:  temperature >100.4   Complete by: As directed    Diet general   Complete by: As directed    Discharge instructions   Complete by: As directed    Follow-up with PCP, patient should be seen by an MD in 1 to 2 days, continue monitor BP and titrate medication accordingly.  Did multiple changes in the antihypertensive medications. Follow-up with nephrology in 1 to 2 weeks as an outpatient, repeat BMP after 1 week Follow-up with GYN as an outpatient if persistent vaginal bleeding. Follow with general surgery if needed for left hip wound.  Continue dressing. Follow-up with PCP for lung cancer screening.   Discharge wound care:   Complete by: As directed    As above   Increase activity slowly   Complete by: As directed        The results of significant diagnostics from this hospitalization (including imaging, microbiology, ancillary and laboratory) are listed below for reference.    Significant Diagnostic Studies: ECHOCARDIOGRAM COMPLETE  Result Date: 05/10/2023    ECHOCARDIOGRAM REPORT   Patient Name:   ITZEL KONICKI Date of Exam: 05/10/2023 Medical Rec #:  [id number]          Height:       62.0 in Accession #:    [id number]         Weight:       119.0 lb Date of Birth:  25-Nov-1960           BSA:          1.533 m Patient Age:    62 years           BP:           183/66 mmHg Patient Gender: F                  HR:           78 bpm. Exam Location:  [facility] Procedure: 2D Echo, Cardiac Doppler and Color Doppler Indications:     CHF-acute diastolic I50.31  History:         Patient has no prior history of Echocardiogram examinations.                   Risk Factors:Diabetes and Hypertension. Stage 3                  CKD.  Sonographer:     Cristela Blue Referring Phys:  [id number] Andris Baumann Diagnosing Phys: Yvonne Kendall MD IMPRESSIONS  1. Left ventricular ejection fraction, by estimation, is 60 to 65%. The left ventricle has normal function. The left ventricle has no regional wall motion abnormalities. Left ventricular diastolic parameters are indeterminate.  2. Right ventricular systolic function is normal. The right ventricular size is normal. There is mildly elevated pulmonary artery systolic pressure.  3. The mitral valve is normal in structure. Trivial mitral valve regurgitation. No evidence of mitral stenosis.  4. The aortic valve has an indeterminant number of cusps. Aortic valve regurgitation is not visualized. Mild aortic valve stenosis.  5. The inferior vena cava is normal in size with greater than 50% respiratory variability, suggesting right atrial pressure of 3 mmHg. FINDINGS  Left  Ventricle: Left ventricular ejection fraction, by estimation, is 60 to 65%. The left ventricle has normal function. The left ventricle has no regional wall motion abnormalities. The left ventricular internal cavity size was normal in size. There is  borderline left ventricular hypertrophy. Left ventricular diastolic parameters are indeterminate. Right Ventricle: The right ventricular size is normal. No increase in right ventricular wall thickness. Right ventricular systolic function is normal. There is mildly elevated pulmonary artery systolic pressure. The tricuspid regurgitant velocity is 3.13  m/s, and with an assumed right atrial pressure of 3 mmHg, the estimated right ventricular systolic pressure is 42.2 mmHg. Left Atrium: Left atrial size was normal in size. Right Atrium: Right atrial size was normal in size. Pericardium: There is no evidence of pericardial effusion. Mitral Valve: The mitral valve is normal in structure. Trivial mitral valve regurgitation. No  evidence of mitral valve stenosis. Tricuspid Valve: The tricuspid valve is not well visualized. Tricuspid valve regurgitation is mild. Aortic Valve: The aortic valve has an indeterminant number of cusps. Aortic valve regurgitation is not visualized. Mild aortic stenosis is present. Aortic valve mean gradient measures 10.7 mmHg. Aortic valve peak gradient measures 19.1 mmHg. Aortic valve  area, by VTI measures 1.70 cm. Pulmonic Valve: The pulmonic valve was not well visualized. Pulmonic valve regurgitation is not visualized. No evidence of pulmonic stenosis. Aorta: The aortic root is normal in size and structure. Pulmonary Artery: The pulmonary artery is of normal size. Venous: The inferior vena cava is normal in size with greater than 50% respiratory variability, suggesting right atrial pressure of 3 mmHg. IAS/Shunts: The interatrial septum was not well visualized.  LEFT VENTRICLE PLAX 2D LVIDd:         4.30 cm   Diastology LVIDs:         2.90 cm   LV e' medial:    8.70 cm/s LV PW:         1.10 cm   LV E/e' medial:  12.8 LV IVS:        0.90 cm   LV e' lateral:   8.38 cm/s LVOT diam:     2.00 cm   LV E/e' lateral: 13.2 LV SV:         74 LV SV Index:   48 LVOT Area:     3.14 cm  RIGHT VENTRICLE RV Basal diam:  3.10 cm RV Mid diam:    2.40 cm RV S prime:     18.50 cm/s TAPSE (M-mode): 2.3 cm LEFT ATRIUM           Index        RIGHT ATRIUM          Index LA diam:      3.60 cm 2.35 cm/m   RA Area:     8.91 cm LA Vol (A4C): 38.4 ml 25.07 ml/m  RA Volume:   18.30 ml 11.93 ml/m  AORTIC VALVE AV Area (Vmax):    1.58 cm AV Area (Vmean):   1.59 cm AV Area (VTI):     1.70 cm AV Vmax:           218.67 cm/s AV Vmean:          151.333 cm/s AV VTI:            0.435 m AV Peak Grad:      19.1 mmHg AV Mean Grad:      10.7 mmHg LVOT Vmax:         110.00 cm/s LVOT Vmean:  76.800 cm/s LVOT VTI:          0.235 m LVOT/AV VTI ratio: 0.54  AORTA Ao Root diam: 2.20 cm MITRAL VALVE                TRICUSPID VALVE MV Area (PHT):  3.34 cm     TR Peak grad:   39.2 mmHg MV Decel Time: 227 msec     TR Vmax:        313.00 cm/s MV E velocity: 111.00 cm/s MV A velocity: 140.00 cm/s  SHUNTS MV E/A ratio:  0.79         Systemic VTI:  0.24 m                             Systemic Diam: 2.00 cm Yvonne Kendall MD Electronically signed by Yvonne Kendall MD Signature Date/Time: 05/10/2023/12:19:51 PM    Final    MR HIP LEFT W WO CONTRAST  Result Date: 05/09/2023 CLINICAL DATA:  Fall 4 months ago.  Worsening left hip pain EXAM: MRI OF THE LEFT HIP WITHOUT AND WITH CONTRAST TECHNIQUE: Multiplanar, multisequence MR imaging was performed both before and after administration of intravenous contrast. CONTRAST:  5mL GADAVIST GADOBUTROL 1 MMOL/ML IV SOLN COMPARISON:  Radiographs 03/17/2023 FINDINGS: Bones: No osteomyelitis observed. No significant abnormal marrow edema. Articular cartilage and labrum Articular cartilage:  Unremarkable Labrum:  Unremarkable Joint or bursal effusion Joint effusion:  Small left hip joint effusion. Bursae: No regional bursitis Muscles and tendons Muscles and tendons: Indistinct intramuscular edema in the contralateral (right) gluteus maximus and upper vastus musculature. Other findings Miscellaneous: A suspected wound along the lateral margin of the left gluteus maximus muscle and hip extends somewhat deep to the iliotibial band towards the greater trochanter, but without abnormal intraosseous signal. Correlate with visual inspection overlying the left hip in assessing this wound. The wound is best shown on images 19 through 27 of series 15. IMPRESSION: 1. Wound along the lateral margin of the left hip, extending deep to the iliotibial band towards the greater trochanter, but without abnormal intraosseous signal to suggest osteomyelitis. Correlate with visual inspection overlying the left hip in assessing this wound. 2. Small left hip joint effusion. 3. Indistinct intramuscular edema in the contralateral (right) gluteus maximus  and upper vastus musculature. Electronically Signed   By: Gaylyn Rong M.D.   On: 05/09/2023 18:06   US RENAL  Result Date: 05/08/2023 CLINICAL DATA:  Acute renal failure EXAM: RENAL / URINARY TRACT ULTRASOUND COMPLETE COMPARISON:  09/30/2022 CT FINDINGS: Right Kidney: Renal measurements: 9.3 x 4.2 x 4.9 cm = volume: 100 mL. Increased renal echogenicity. No hydronephrosis. Left Kidney: Renal measurements: 9.2 x 5.3 x 4.5 cm = volume: 113 mL. Increased renal echogenicity. No hydronephrosis. Lower pole left renal cyst of 1.3 cm. Bladder: Appears normal for degree of bladder distention. Other: None. IMPRESSION: No hydronephrosis. Increased renal echogenicity, suggesting chronic medical renal disease. Electronically Signed   By: Jeronimo Greaves M.D.   On: 05/08/2023 12:20   DG Chest 2 View  Result Date: 05/07/2023 CLINICAL DATA:  Bilateral lower extremity edema EXAM: CHEST - 2 VIEW COMPARISON:  02/13/2023 FINDINGS: Small bilateral pleural effusions. Borderline to mild cardiomegaly. Hazy airspace disease at the bases, favor atelectasis. No pneumothorax IMPRESSION: Small bilateral pleural effusions with hazy airspace disease at the bases, favor atelectasis. Electronically Signed   By: Jasmine Pang M.D.   On: 05/07/2023 18:03    Microbiology: Recent Results (  from the past 240 hour(s))  Resp panel by RT-PCR (RSV, Flu A&B, Covid) Anterior Nasal Swab     Status: None   Collection Time: 05/07/23 10:26 PM   Specimen: Anterior Nasal Swab  Result Value Ref Range Status   SARS Coronavirus 2 by RT PCR NEGATIVE NEGATIVE Final    Comment: (NOTE) SARS-CoV-2 target nucleic acids are NOT DETECTED.  The SARS-CoV-2 RNA is generally detectable in upper respiratory specimens during the acute phase of infection. The lowest concentration of SARS-CoV-2 viral copies this assay can detect is 138 copies/mL. A negative result does not preclude SARS-Cov-2 infection and should not be used as the sole basis for treatment  or other patient management decisions. A negative result may occur with  improper specimen collection/handling, submission of specimen other than nasopharyngeal swab, presence of viral mutation(s) within the areas targeted by this assay, and inadequate number of viral copies(<138 copies/mL). A negative result must be combined with clinical observations, patient history, and epidemiological information. The expected result is Negative.  Fact Sheet for Patients:  BloggerCourse.com  Fact Sheet for Healthcare Providers:  SeriousBroker.it  This test is no t yet approved or cleared by the [place] FDA and  has been authorized for detection and/or diagnosis of SARS-CoV-2 by FDA under an Emergency Use Authorization (EUA). This EUA will remain  in effect (meaning this test can be used) for the duration of the COVID-19 declaration under Section 564(b)(1) of the Act, 21 U.S.C.section 360bbb-3(b)(1), unless the authorization is terminated  or revoked sooner.       Influenza A by PCR NEGATIVE NEGATIVE Final   Influenza B by PCR NEGATIVE NEGATIVE Final    Comment: (NOTE) The Xpert Xpress SARS-CoV-2/FLU/RSV plus assay is intended as an aid in the diagnosis of influenza from Nasopharyngeal swab specimens and should not be used as a sole basis for treatment. Nasal washings and aspirates are unacceptable for Xpert Xpress SARS-CoV-2/FLU/RSV testing.  Fact Sheet for Patients: BloggerCourse.com  Fact Sheet for Healthcare Providers: SeriousBroker.it  This test is not yet approved or cleared by the [place] FDA and has been authorized for detection and/or diagnosis of SARS-CoV-2 by FDA under an Emergency Use Authorization (EUA). This EUA will remain in effect (meaning this test can be used) for the duration of the COVID-19 declaration under Section 564(b)(1) of the Act, 21 U.S.C. section  360bbb-3(b)(1), unless the authorization is terminated or revoked.     Resp Syncytial Virus by PCR NEGATIVE NEGATIVE Final    Comment: (NOTE) Fact Sheet for Patients: BloggerCourse.com  Fact Sheet for Healthcare Providers: SeriousBroker.it  This test is not yet approved or cleared by the [place] FDA and has been authorized for detection and/or diagnosis of SARS-CoV-2 by FDA under an Emergency Use Authorization (EUA). This EUA will remain in effect (meaning this test can be used) for the duration of the COVID-19 declaration under Section 564(b)(1) of the Act, 21 U.S.C. section 360bbb-3(b)(1), unless the authorization is terminated or revoked.  Performed at [facility], [address] [place], [place] [address]   Aerobic/Anaerobic Culture w Gram Stain (surgical/deep wound)     Status: None   Collection Time: 05/09/23 11:30 AM   Specimen: Wound  Result Value Ref Range Status   Specimen Description   Final    WOUND Performed at [facility], [address] [place], [place] [address]    Special Requests   Final    LEFT HIP Performed at [facility], [address]  [place], [place] [address]    Gram Stain   Final    RARE WBC PRESENT, PREDOMINANTLY PMN MODERATE GRAM NEGATIVE RODS MODERATE GRAM POSITIVE COCCI IN PAIRS    Culture   Final    ABUNDANT STAPHYLOCOCCUS AUREUS ABUNDANT STREPTOCOCCUS MITIS/ORALIS ABUNDANT BACTEROIDES FRAGILIS BETA LACTAMASE POSITIVE Performed at [facility], [address], [place], [place] [address]    Report Status 05/12/2023 FINAL  Final   Organism ID, Bacteria STAPHYLOCOCCUS AUREUS  Final   Organism ID, Bacteria STREPTOCOCCUS MITIS/ORALIS  Final      Susceptibility   Staphylococcus aureus - MIC*    CIPROFLOXACIN <=0.5 SENSITIVE Sensitive     ERYTHROMYCIN 4 INTERMEDIATE Intermediate     GENTAMICIN <=0.5 SENSITIVE Sensitive     OXACILLIN <=0.25  SENSITIVE Sensitive     TETRACYCLINE <=1 SENSITIVE Sensitive     VANCOMYCIN 1 SENSITIVE Sensitive     TRIMETH/SULFA <=10 SENSITIVE Sensitive     CLINDAMYCIN <=0.25 SENSITIVE Sensitive     RIFAMPIN <=0.5 SENSITIVE Sensitive     Inducible Clindamycin NEGATIVE Sensitive     LINEZOLID 2 SENSITIVE Sensitive     * ABUNDANT STAPHYLOCOCCUS AUREUS   Streptococcus mitis/oralis - MIC*    TETRACYCLINE >=16 RESISTANT Resistant     VANCOMYCIN 0.5 SENSITIVE Sensitive     CLINDAMYCIN >=1 RESISTANT Resistant     * ABUNDANT STREPTOCOCCUS MITIS/ORALIS     Labs: CBC: Recent Labs  Lab 05/10/23 0838 05/12/23 0519 05/13/23 0358 05/14/23 0526 05/15/23 0618 05/16/23 0545  WBC 13.3* 9.1 11.0* 12.8* 10.9* 10.1  NEUTROABS 10.7*  --   --   --   --   --   HGB 9.5* 8.7* 8.8* 8.7* 8.6* 8.6*  HCT 30.3* 27.8* 29.2* 29.4* 28.5* 28.2*  MCV 90.7 89.7 94.2 94.5 94.4 95.6  PLT 578* 594* 639* 601* 552* 486*   Basic Metabolic Panel: Recent Labs  Lab 05/12/23 0519 05/13/23 0358 05/14/23 0526 05/15/23 0618 05/16/23 0545  NA 138 140 140 139 141  K 4.3 4.2 5.0 4.9 5.2*  CL 98 101 106 105 107  CO2 27 26 26 23 23   GLUCOSE 150* 138* 128* 115* 110*  BUN 42* 46* 42* 38* 39*  CREATININE 3.15* 3.38* 3.44* 3.25* 3.36*  CALCIUM 8.9 9.2 9.4 9.4 9.7  MG 2.4 2.6*  --   --   --   PHOS 5.1* 4.2  --   --   --    Liver Function Tests: No results for input(s): "AST", "ALT", "ALKPHOS", "BILITOT", "PROT", "ALBUMIN" in the last 168 hours. No results for input(s): "LIPASE", "AMYLASE" in the last 168 hours. No results for input(s): "AMMONIA" in the last 168 hours. Cardiac Enzymes: No results for input(s): "CKTOTAL", "CKMB", "CKMBINDEX", "TROPONINI" in the last 168 hours. BNP (last 3 results) Recent Labs    05/07/23 1619  BNP 441.2*   CBG: Recent Labs  Lab 05/15/23 1202 05/15/23 1554 05/15/23 2242 05/16/23 0730 05/16/23 1210  GLUCAP 157* 103* 115* 103* 203*    Time spent: 35 minutes  Signed:  Gillis Santa  [facility] 05/16/2023 1:43 PM

## 2023-05-16 NOTE — TOC Transition Note (Signed)
Transition of Care [facility]) - CM/SW Discharge Note   Patient Details  Name: Alicia Rogers MRN: [id number] Date of Birth: 10-Aug-1960  [facility]) CM/SW Contact:  Allena Katz, LCSW Phone Number: 05/16/2023, 2:58 PM   Clinical Narrative:   Pt discharging to [facility]. Medical necessity printed and on chart. Dc summary sent. [name] notified. EMS called patient is 9th on list. CSW signing off.     Final next level of care: Skilled Nursing Facility Barriers to Discharge: Barriers Resolved   Patient Goals and CMS Choice CMS Medicare.gov Compare Post Acute Care list provided to:: Patient    Discharge Placement                Patient chooses bed at: [facility] [place] Patient to be transferred to facility by: [facility]   Patient and family notified of of transfer: 05/16/23  Discharge Plan and Services Additional resources added to the After Visit Summary for                                       Social Determinants of Health (SDOH) Interventions SDOH Screenings   Food Insecurity: No Food Insecurity (03/04/2023)   Received from [facility]  Housing: Patient Declined (08/30/2022)  Transportation Needs: No Transportation Needs (03/04/2023)   Received from [facility]  Utilities: Not At Risk (03/04/2023)   Received from [facility]  Alcohol Screen: Low Risk  (09/29/2021)  Financial Resource Strain: Medium Risk (03/04/2023)   Received from [facility]  Social Connections: Unknown (09/28/2022)   Received from [facility]  Tobacco Use: High Risk (05/07/2023)     Readmission Risk Interventions    05/10/2023   10:05 AM  Readmission Risk Prevention Plan  Transportation Screening Complete  HRI or Home Care Consult Complete  Palliative Care Screening Complete  Medication Review (RN Care Manager) Complete

## 2023-05-16 NOTE — Care Management Important Message (Signed)
Important Message  Patient Details  Name: Alicia Rogers MRN: [id number] Date of Birth: [birth year]   Important Message Given:  Yes - Medicare IM     Olegario Messier [name] 05/16/2023, 1:49 PM

## 2023-05-16 NOTE — TOC Progression Note (Signed)
Transition of Care [facility]) - Progression Note    Patient Details  Name: ALAZNE QUANT MRN: [id number] Date of Birth: Jul 14, 1961  [facility] CM/SW Contact  Allena Katz, LCSW Phone Number: 05/16/2023, 10:13 AM  Clinical Narrative:   Auth approved for [facility] for 10/14-10/16.          Expected Discharge Plan and Services                                               Social Determinants of Health (SDOH) Interventions SDOH Screenings   Food Insecurity: No Food Insecurity (03/04/2023)   Received from [facility]  Housing: Patient Declined (08/30/2022)  Transportation Needs: No Transportation Needs (03/04/2023)   Received from [facility]  Utilities: Not At Risk (03/04/2023)   Received from [facility]  Alcohol Screen: Low Risk  (09/29/2021)  Financial Resource Strain: Medium Risk (03/04/2023)   Received from [facility]  Social Connections: Unknown (09/28/2022)   Received from [facility]  Tobacco Use: High Risk (05/07/2023)    Readmission Risk Interventions    05/10/2023   10:05 AM  Readmission Risk Prevention Plan  Transportation Screening Complete  HRI or Home Care Consult Complete  Palliative Care Screening Complete  Medication Review (RN Care Manager) Complete

## 2023-05-16 NOTE — Progress Notes (Signed)
Report called to [facility] RN at [facility], all questions answered.  AVS given to EMS for transport, all belongings bagged and went with patient.

## 2023-05-16 NOTE — Progress Notes (Signed)
Physical Therapy Treatment Patient Details Name: Alicia Rogers MRN: [id number] DOB: May 23, 1961 Today's Date: 05/16/2023   History of Present Illness Pt is a 62 y.o. 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    PT Comments  Patient is agreeable to PT session. She requested to get up to the chair. Patient continues to require assistance with bed mobility, transfers, and short distance ambulation using rolling walker. Activity tolerance limited by fatigue. Recommend to continue PT to maximize independence. Continued rehabilitation < 3 hours/day recommended after this hospital stay.    If plan is discharge home, recommend the following: A little help with walking and/or transfers;A little help with bathing/dressing/bathroom;Assist for transportation;Help with stairs or ramp for entrance;Supervision due to cognitive status   Can travel by private vehicle     No  Equipment Recommendations   (to be determined at next level of care)    Recommendations for Other Services       Precautions / Restrictions Precautions Precautions: Fall Restrictions Weight Bearing Restrictions: No     Mobility  Bed Mobility Overal bed mobility: Needs Assistance Bed Mobility: Supine to Sit     Supine to sit: Min assist     General bed mobility comments: assistance for trunk support to sit upright    Transfers Overall transfer level: Needs assistance Equipment used: Rolling walker (2 wheels) Transfers: Sit to/from Stand Sit to Stand: Contact guard  assist           General transfer comment: several standing bouts performed. cues for hand placement for safety    Ambulation/Gait Ambulation/Gait assistance: Contact guard assist Gait Distance (Feet): 8 Feet Assistive device: Rolling walker (2 wheels) Gait Pattern/deviations: Narrow base of support, Decreased stride length Gait velocity: decreased     General Gait Details: short distance ambulation with CGA for safety. narrow base of support. activity tolerance limited by fatigue   Stairs             Wheelchair Mobility     Tilt Bed    Modified Rankin (Stroke Patients Only)       Balance Overall balance assessment: Needs assistance Sitting-balance support: Feet supported Sitting balance-Leahy Scale: Fair     Standing balance support: Reliant on assistive device for balance, During functional activity, Bilateral upper extremity supported Standing balance-Leahy Scale: Fair                              Cognition Arousal: Alert Behavior During Therapy: Flat affect Overall Cognitive Status: No family/caregiver present to determine baseline cognitive functioning                                 General Comments: increased time required to follow commands        Exercises      General Comments General comments (skin integrity, edema, etc.): patient required assistance for peri care following urination in bed side commode. small volume of urine incontinence noted.      Pertinent Vitals/Pain Pain Assessment Pain Assessment: No/denies  pain    Home Living                          Prior Function            PT Goals (current goals can now be found in the care plan section) Acute Rehab PT Goals Patient Stated Goal: to get better PT Goal Formulation: With patient Time For Goal Achievement: 05/23/23 Potential to Achieve Goals: Good Progress towards PT goals: Progressing toward goals    Frequency    Min  1X/week      PT Plan      Co-evaluation              AM-PAC PT "6 Clicks" Mobility   Outcome Measure  Help needed turning from your back to your side while in a flat bed without using bedrails?: A Little Help needed moving from lying on your back to sitting on the side of a flat bed without using bedrails?: A Little Help needed moving to and from a bed to a chair (including a wheelchair)?: A Little Help needed standing up from a chair using your arms (e.g., wheelchair or bedside chair)?: A Little Help needed to walk in hospital room?: A Little Help needed climbing 3-5 steps with a railing? : A Lot 6 Click Score: 17    End of Session   Activity Tolerance: Patient limited by fatigue Patient left: in chair;with call bell/phone within reach;with chair alarm set Nurse Communication: Mobility status PT Visit Diagnosis: Unsteadiness on feet (R26.81);Muscle weakness (generalized) (M62.81);Pain     Time: 1610-9604 PT Time Calculation (min) (ACUTE ONLY): 19 min  Charges:    $Therapeutic Activity: 8-22 mins PT General Charges $$ ACUTE PT VISIT: 1 Visit                     Donna Bernard, PT, MPT    Ina Homes 05/16/2023, 1:07 PM

## 2023-05-17 ENCOUNTER — Inpatient Hospital Stay: Payer: 59

## 2023-05-25 ENCOUNTER — Encounter: Payer: 59 | Attending: Internal Medicine | Admitting: Internal Medicine

## 2023-05-25 DIAGNOSIS — L89224 Pressure ulcer of left hip, stage 4: Secondary | ICD-10-CM | POA: Diagnosis present

## 2023-05-25 DIAGNOSIS — N186 End stage renal disease: Secondary | ICD-10-CM | POA: Diagnosis not present

## 2023-05-25 DIAGNOSIS — E11622 Type 2 diabetes mellitus with other skin ulcer: Secondary | ICD-10-CM | POA: Diagnosis not present

## 2023-05-25 DIAGNOSIS — E1151 Type 2 diabetes mellitus with diabetic peripheral angiopathy without gangrene: Secondary | ICD-10-CM | POA: Diagnosis not present

## 2023-05-25 DIAGNOSIS — E440 Moderate protein-calorie malnutrition: Secondary | ICD-10-CM | POA: Diagnosis not present

## 2023-05-25 DIAGNOSIS — E1122 Type 2 diabetes mellitus with diabetic chronic kidney disease: Secondary | ICD-10-CM | POA: Diagnosis not present

## 2023-05-25 DIAGNOSIS — I120 Hypertensive chronic kidney disease with stage 5 chronic kidney disease or end stage renal disease: Secondary | ICD-10-CM | POA: Diagnosis not present

## 2023-05-25 DIAGNOSIS — L97328 Non-pressure chronic ulcer of left ankle with other specified severity: Secondary | ICD-10-CM | POA: Insufficient documentation

## 2023-05-25 DIAGNOSIS — J449 Chronic obstructive pulmonary disease, unspecified: Secondary | ICD-10-CM | POA: Diagnosis not present

## 2023-05-25 DIAGNOSIS — F17200 Nicotine dependence, unspecified, uncomplicated: Secondary | ICD-10-CM | POA: Insufficient documentation

## 2023-05-25 DIAGNOSIS — D509 Iron deficiency anemia, unspecified: Secondary | ICD-10-CM | POA: Diagnosis not present

## 2023-05-30 NOTE — Progress Notes (Signed)
the level of the heart and pump ankles as often as possible Elevate leg(s) parallel to the floor when sitting. Wound Treatment Wound #1 - Trochanter Wound Laterality: Left Cleanser: Wound Cleanser 1 x Per Day/30 Days Discharge Instructions: Wash your hands with soap and water. Remove old dressing, discard into plastic bag and place into trash. Cleanse the wound with Wound Cleanser prior to applying a clean dressing using gauze sponges, not tissues or cotton balls. Do not scrub or use excessive force. Pat dry using gauze sponges, not tissue or cotton balls. Prim Dressing: Promogran Matrix 4.34 (in) 1 x Per Day/30 Days ary Discharge Instructions: Moisten w/hydrogel; Cover wound as directed. place into undermining areas Secondary Dressing: (BORDER) Zetuvit Plus SILICONE BORDER Dressing 4x4 (in/in) 1 x Per Day/30 Days Discharge Instructions: Please do not put silicone bordered dressings under wraps. Use non-bordered dressing only. Wound #2 - Ankle Wound Laterality: Right, Lateral Cleanser: Wound Cleanser 1 x Per Day/30 Days Discharge Instructions: Wash your hands  with soap and water. Remove old dressing, discard into plastic bag and place into trash. Cleanse the wound with Wound Cleanser prior to applying a clean dressing using gauze sponges, not tissues or cotton balls. Do not scrub or use excessive force. Pat dry using gauze sponges, not tissue or cotton balls. Prim Dressing: Santyl Collagenase Ointment, 30 (gm), tube ary 1 x Per Day/30 Days Secondary Dressing: (BORDER) Zetuvit Plus SILICONE BORDER Dressing 4x4 (in/in) 1 x Per Day/30 Days Discharge Instructions: Please do not put silicone bordered dressings under wraps. Use non-bordered dressing only. Alicia Rogers, Alicia Rogers ([id number]) [id number] Page 5 of 9 Consults Vascular - ABI and TBI /Arterial doppler bilat at [facility] ASAP - to be scheduled by [facility] Electronic Signature(s) Signed: 05/26/2023 3:33:32 PM By: Baltazar Najjar MD Signed: 05/30/2023 8:00:25 AM By: Yevonne Pax RN Entered By: Yevonne Pax on 05/25/2023 07:18:11 -------------------------------------------------------------------------------- Problem List Details Patient Name: Date of Service: Alicia Rogers, Alicia RMETTE S. 05/25/2023 8:45 A M Medical Record Number: [id number] Patient Account Number: [id number] Date of Birth/Sex: Treating RN: [birth year] (62 y.o. 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, stage 4 05/25/2023 No Yes E11.622 Type 2 diabetes mellitus with other skin ulcer 05/25/2023 No Yes L97.328 Non-pressure chronic ulcer of left ankle with other specified severity 05/25/2023 No Yes E11.51 Type 2 diabetes mellitus with diabetic peripheral angiopathy without gangrene 05/25/2023 No Yes Inactive  Problems Resolved Problems Electronic Signature(s) Signed: 05/26/2023 3:33:32 PM By: Baltazar Najjar MD Entered By: Baltazar Najjar on 05/25/2023 07:40:40 Alicia Rogers ([id number]) [id number] Page 6 of 9 -------------------------------------------------------------------------------- Progress Note Details Patient Name: Date of Service: Alicia Rogers 05/25/2023 8:45 A M Medical Record Number: [id number] Patient Account Number: [id number] Date of Birth/Sex: Treating RN: Jul 18, 1961 (62 y.o. Alicia Rogers Primary Care Provider: Leanord [place], [place] Other Clinician: Referring Provider: Treating Provider/Extender: RO BSO N, MICHA EL G FELIZ [place], [place] Weeks in Treatment: 0 Subjective Chief Complaint Information obtained from Patient 05/25/2023; patient is here for review of wounds on her left hip and her right lateral ankle. Currently she is at [facility] facility History of Present Illness (HPI) ADMISSION 05/25/2023 This is a 62 year old woman who was hospitalized from 05/07/2023 through 05/16/2023 with anasarca and hypoxia. She was discovered  Date of Service: Alicia Rogers 05/25/2023 8:45 A M Medical Record Number: [id number] Patient Account Number: [id number] Date of Birth/Sex: Treating RN: 1961-07-02 (62 y.o. Alicia Rogers Primary Care Provider: Leanord Asal, [place] Other Clinician: Referring Provider: Treating Provider/Extender: RO BSO N, MICHA EL G FELIZ [place]EN[place]Z, [place] Weeks in Treatment: 0 Genitourinary Complaints and Symptoms: Positive for: Kidney failure/ Dialysis Review of System Notes: to start dialyisis next week Integumentary (Skin) Complaints and Symptoms: Positive for: Wounds Respiratory Medical History: Positive for: Chronic Obstructive Pulmonary Disease (COPD) Endocrine Medical History: Positive for: Type II Diabetes Time with diabetes: 23 Treated with: Oral agents Blood sugar tested every day:  No Immunizations Pneumococcal Vaccine: Received Pneumococcal Vaccination: No Implantable Devices None Family and Social History Current every day smoker; Marital Status - Single; Alcohol Use: Never; Drug Use: No History; Caffeine Use: Daily Electronic Signature(s) Signed: 05/26/2023 3:33:32 PM By: Baltazar Najjar MD Signed: 05/30/2023 8:00:25 AM By: Yevonne Pax RN Entered By: Yevonne Pax on 05/25/2023 06:14:14 Alicia Rogers, Alicia Rogers ([id number]) [id number] Page 9 of 9 -------------------------------------------------------------------------------- SuperBill Details Patient Name: Date of Service: Alicia Rogers 05/25/2023 Medical Record Number: [id number] Patient Account Number: [id number] Date of Birth/Sex: Treating RN: 04-Jul-1961 (62 y.o. 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, stage 4 E11.622 Type 2 diabetes mellitus with other skin ulcer L97.328 Non-pressure chronic ulcer of left ankle with other specified severity E11.51 Type 2 diabetes mellitus with diabetic peripheral angiopathy without gangrene Facility Procedures : CPT4 Code: 40347425 Description: 99213 - WOUND CARE VISIT-LEV 3 EST PT Modifier: Quantity: 1 : CPT4 Code: 95638756 Description: 43329 - DEBRIDE W/O ANES NON SELECT Modifier: Quantity: 1 Physician Procedures : CPT4 Code Description Modifier [id number] 99204 - WC PHYS LEVEL 4 - NEW PT ICD-10 Diagnosis Description 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 Wound Treatment Wound #1 - Trochanter Wound Laterality: Left Cleanser: Wound Cleanser 1 x Per Day/30 Days Discharge Instructions: Wash your hands with soap and water. Remove old dressing, discard into plastic bag and place into trash. Cleanse the wound with Wound Cleanser prior to applying a clean dressing using gauze sponges, not tissues or cotton balls. Do not scrub or use excessive force. Pat dry using gauze sponges, not tissue or cotton balls. Prim Dressing: Promogran Matrix 4.34 (in) 1 x Per Day/30 Days ary Discharge Instructions: Moisten w/hydrogel; Cover wound as directed. place into undermining areas Secondary Dressing: (BORDER) Zetuvit Plus SILICONE BORDER Dressing 4x4 (in/in) 1 x Per Day/30 Days Discharge Instructions: Please do not put silicone bordered dressings under wraps. Use non-bordered dressing only. Wound #2 - Ankle Wound Laterality: Right, Lateral Cleanser: Wound Cleanser 1 x Per Day/30 Days Discharge Instructions: Wash your hands  with soap and water. Remove old dressing, discard into plastic bag and place into trash. Cleanse the wound with Wound Cleanser prior to applying a clean dressing using gauze sponges, not tissues or cotton balls. Do not scrub or use excessive force. Pat dry using gauze sponges, not tissue or cotton balls. Prim Dressing: Santyl Collagenase Ointment, 30 (gm), tube ary 1 x Per Day/30 Days Secondary Dressing: (BORDER) Zetuvit Plus SILICONE BORDER Dressing 4x4 (in/in) 1 x Per Day/30 Days Discharge Instructions: Please do not put silicone bordered dressings under wraps. Use non-bordered dressing only. Alicia Rogers, Alicia Rogers ([id number]) [id number] Page 5 of 9 Consults Vascular - ABI and TBI /Arterial doppler bilat at [facility] ASAP - to be scheduled by [facility] Electronic Signature(s) Signed: 05/26/2023 3:33:32 PM By: Baltazar Najjar MD Signed: 05/30/2023 8:00:25 AM By: Yevonne Pax RN Entered By: Yevonne Pax on 05/25/2023 07:18:11 -------------------------------------------------------------------------------- Problem List Details Patient Name: Date of Service: Alicia Rogers, Alicia RMETTE S. 05/25/2023 8:45 A M Medical Record Number: [id number] Patient Account Number: [id number] Date of Birth/Sex: Treating RN: [birth year] (62 y.o. 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, stage 4 05/25/2023 No Yes E11.622 Type 2 diabetes mellitus with other skin ulcer 05/25/2023 No Yes L97.328 Non-pressure chronic ulcer of left ankle with other specified severity 05/25/2023 No Yes E11.51 Type 2 diabetes mellitus with diabetic peripheral angiopathy without gangrene 05/25/2023 No Yes Inactive  Problems Resolved Problems Electronic Signature(s) Signed: 05/26/2023 3:33:32 PM By: Baltazar Najjar MD Entered By: Baltazar Najjar on 05/25/2023 07:40:40 Alicia Rogers ([id number]) [id number] Page 6 of 9 -------------------------------------------------------------------------------- Progress Note Details Patient Name: Date of Service: Alicia Rogers 05/25/2023 8:45 A M Medical Record Number: [id number] Patient Account Number: [id number] Date of Birth/Sex: Treating RN: Jul 18, 1961 (62 y.o. Alicia Rogers Primary Care Provider: Leanord [place], [place] Other Clinician: Referring Provider: Treating Provider/Extender: RO BSO N, MICHA EL G FELIZ [place], [place] Weeks in Treatment: 0 Subjective Chief Complaint Information obtained from Patient 05/25/2023; patient is here for review of wounds on her left hip and her right lateral ankle. Currently she is at [facility] facility History of Present Illness (HPI) ADMISSION 05/25/2023 This is a 62 year old woman who was hospitalized from 05/07/2023 through 05/16/2023 with anasarca and hypoxia. She was discovered  Alicia Rogers, Alicia Rogers ([id number]) [id number] Page 1 of 9 Visit Report for 05/25/2023 Chief Complaint Document Details Patient Name: Date of Service: Alicia Rogers RMETTE S. 05/25/2023 8:45 A M Medical Record Number: [id number] Patient Account Number: [id number] Date of Birth/Sex: Treating RN: 1960/12/23 (62 y.o. Alicia Rogers Primary Care Provider: Leanord [place], [place] Other Clinician: Referring Provider: Treating Provider/Extender: RO BSO N, MICHA EL G FELIZ [place]EN[place]Z, [place] Weeks in Treatment: 0 Information Obtained from: Patient Chief Complaint 05/25/2023; patient is here for review of wounds on her left hip and her right lateral ankle. Currently she is at [facility] Electronic Signature(s) Signed: 05/26/2023 3:33:32 PM By: Baltazar Najjar MD Entered By: Baltazar Najjar on 05/25/2023 07:41:22 -------------------------------------------------------------------------------- Debridement Details Patient Name: Date of Service: Alicia Rogers, Alicia RMETTE S. 05/25/2023 8:45 A M Medical Record Number: [id number] Patient Account Number: [id number] Date of Birth/Sex: Treating RN: 09-Nov-1960 (62 y.o. Alicia Rogers Primary Care Provider: Leanord [place], [place] Other Clinician: Referring Provider: Treating Provider/Extender: RO BSO N, MICHA EL G FELIZ [place]EN[place]Z, [place] Weeks in Treatment: 0 Debridement Performed for Assessment: Wound #1 Left Trochanter Performed By: Physician Maxwell Caul, MD Debridement Type: Chemical/Enzymatic/Mechanical Agent Used: saline gauze Level of Consciousness (Pre-procedure): Awake and Alert Pre-procedure Verification/Time Out No Taken: Start Time: 09:35 Percent of Wound Bed Debrided: Instrument: Other : saline gauze Bleeding: Minimum Hemostasis Achieved: Pressure End Time: 09:37 Procedural Pain: 0 Post Procedural Pain: 0 Response to Treatment: Procedure was tolerated well Level of Consciousness (Post-  Awake and Alert procedure): Alicia Rogers, Alicia Rogers ([id number]) [id number].pdf Page 2 of 9 Post Debridement Measurements of Total Wound Length: (cm) 2 Stage: Category/Stage IV Width: (cm) 5 Depth: (cm) 0.7 Volume: (cm) 5.498 Character of Wound/Ulcer Post Debridement: Improved Post Procedure Diagnosis Same as Pre-procedure Electronic Signature(s) Signed: 05/25/2023 10:47:26 AM By: Yevonne Pax RN Signed: 05/26/2023 3:33:32 PM By: Baltazar Najjar MD Entered By: Yevonne Pax on 05/25/2023 07:47:26 -------------------------------------------------------------------------------- Debridement Details Patient Name: Date of Service: Alicia Rogers, Alicia RMETTE S. 05/25/2023 8:45 A M Medical Record Number: [id number] Patient Account Number: [id number] Date of Birth/Sex: Treating RN: [birth year] (62 y.o. Alicia Rogers Primary Care Provider: Leanord [place], [place] Other Clinician: Referring Provider: Treating Provider/Extender: RO BSO N, MICHA EL G FELIZ [place], [place] Weeks in Treatment: 0 Debridement Performed for Assessment: Wound #2 Right,Lateral Ankle Performed By: Physician Maxwell Caul, MD Debridement Type: Chemical/Enzymatic/Mechanical Agent Used: Santyl Severity of Tissue Pre Debridement: Fat layer exposed Level of Consciousness (Pre-procedure): Awake and Alert Pre-procedure Verification/Time Out No Taken: Start Time: 09:35 Percent of Wound Bed Debrided: Instrument: Other : saline gauze Bleeding: Minimum Hemostasis Achieved: Pressure End Time: 09:39 Procedural Pain: 0 Post Procedural Pain: 0 Response to Treatment: Procedure was tolerated well Level of Consciousness (Post- Awake and Alert procedure): Post Debridement Measurements of Total Wound Length: (cm) 1 Width: (cm) 0.7 Depth: (cm) 0.3 Volume: (cm) 0.165 Character of Wound/Ulcer Post Debridement: Improved Severity of Tissue Post Debridement: Fat layer exposed Post Procedure Diagnosis Same  as Pre-procedure Electronic Signature(s) Signed: 05/25/2023 10:48:27 AM By: Yevonne Pax RN Signed: 05/26/2023 3:33:32 PM By: Baltazar Najjar MD Entered By: Yevonne Pax on 05/25/2023 07:48:27 Alicia Rogers ([id number]) [id number] Page 3 of 9 -------------------------------------------------------------------------------- HPI Details Patient Name: Date of Service: Alicia Rogers 05/25/2023 8:45 A M Medical Record Number: [id number] Patient Account Number: [id number] Date of Birth/Sex: Treating RN: January 17, 1961 (61 y.o. Alicia Rogers Primary Care Provider: Leanord As[place], [place] Other Clinician: Referring Provider:  the level of the heart and pump ankles as often as possible Elevate leg(s) parallel to the floor when sitting. Wound Treatment Wound #1 - Trochanter Wound Laterality: Left Cleanser: Wound Cleanser 1 x Per Day/30 Days Discharge Instructions: Wash your hands with soap and water. Remove old dressing, discard into plastic bag and place into trash. Cleanse the wound with Wound Cleanser prior to applying a clean dressing using gauze sponges, not tissues or cotton balls. Do not scrub or use excessive force. Pat dry using gauze sponges, not tissue or cotton balls. Prim Dressing: Promogran Matrix 4.34 (in) 1 x Per Day/30 Days ary Discharge Instructions: Moisten w/hydrogel; Cover wound as directed. place into undermining areas Secondary Dressing: (BORDER) Zetuvit Plus SILICONE BORDER Dressing 4x4 (in/in) 1 x Per Day/30 Days Discharge Instructions: Please do not put silicone bordered dressings under wraps. Use non-bordered dressing only. Wound #2 - Ankle Wound Laterality: Right, Lateral Cleanser: Wound Cleanser 1 x Per Day/30 Days Discharge Instructions: Wash your hands  with soap and water. Remove old dressing, discard into plastic bag and place into trash. Cleanse the wound with Wound Cleanser prior to applying a clean dressing using gauze sponges, not tissues or cotton balls. Do not scrub or use excessive force. Pat dry using gauze sponges, not tissue or cotton balls. Prim Dressing: Santyl Collagenase Ointment, 30 (gm), tube ary 1 x Per Day/30 Days Secondary Dressing: (BORDER) Zetuvit Plus SILICONE BORDER Dressing 4x4 (in/in) 1 x Per Day/30 Days Discharge Instructions: Please do not put silicone bordered dressings under wraps. Use non-bordered dressing only. Alicia Rogers, Alicia Rogers ([id number]) [id number] Page 5 of 9 Consults Vascular - ABI and TBI /Arterial doppler bilat at [facility] ASAP - to be scheduled by [facility] Electronic Signature(s) Signed: 05/26/2023 3:33:32 PM By: Baltazar Najjar MD Signed: 05/30/2023 8:00:25 AM By: Yevonne Pax RN Entered By: Yevonne Pax on 05/25/2023 07:18:11 -------------------------------------------------------------------------------- Problem List Details Patient Name: Date of Service: Alicia Rogers, Alicia RMETTE S. 05/25/2023 8:45 A M Medical Record Number: [id number] Patient Account Number: [id number] Date of Birth/Sex: Treating RN: [birth year] (62 y.o. 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, stage 4 05/25/2023 No Yes E11.622 Type 2 diabetes mellitus with other skin ulcer 05/25/2023 No Yes L97.328 Non-pressure chronic ulcer of left ankle with other specified severity 05/25/2023 No Yes E11.51 Type 2 diabetes mellitus with diabetic peripheral angiopathy without gangrene 05/25/2023 No Yes Inactive  Problems Resolved Problems Electronic Signature(s) Signed: 05/26/2023 3:33:32 PM By: Baltazar Najjar MD Entered By: Baltazar Najjar on 05/25/2023 07:40:40 Alicia Rogers ([id number]) [id number] Page 6 of 9 -------------------------------------------------------------------------------- Progress Note Details Patient Name: Date of Service: Alicia Rogers 05/25/2023 8:45 A M Medical Record Number: [id number] Patient Account Number: [id number] Date of Birth/Sex: Treating RN: Jul 18, 1961 (62 y.o. Alicia Rogers Primary Care Provider: Leanord [place], [place] Other Clinician: Referring Provider: Treating Provider/Extender: RO BSO N, MICHA EL G FELIZ [place], [place] Weeks in Treatment: 0 Subjective Chief Complaint Information obtained from Patient 05/25/2023; patient is here for review of wounds on her left hip and her right lateral ankle. Currently she is at [facility] facility History of Present Illness (HPI) ADMISSION 05/25/2023 This is a 62 year old woman who was hospitalized from 05/07/2023 through 05/16/2023 with anasarca and hypoxia. She was discovered  Alicia Rogers, Alicia Rogers ([id number]) [id number] Page 1 of 9 Visit Report for 05/25/2023 Chief Complaint Document Details Patient Name: Date of Service: Alicia Rogers RMETTE S. 05/25/2023 8:45 A M Medical Record Number: [id number] Patient Account Number: [id number] Date of Birth/Sex: Treating RN: 1960/12/23 (62 y.o. Alicia Rogers Primary Care Provider: Leanord [place], [place] Other Clinician: Referring Provider: Treating Provider/Extender: RO BSO N, MICHA EL G FELIZ [place]Z, [place] Weeks in Treatment: 0 Information Obtained from: Patient Chief Complaint 05/25/2023; patient is here for review of wounds on her left hip and her right lateral ankle. Currently she is at [facility] Electronic Signature(s) Signed: 05/26/2023 3:33:32 PM By: Baltazar Najjar MD Entered By: Baltazar Najjar on 05/25/2023 07:41:22 -------------------------------------------------------------------------------- Debridement Details Patient Name: Date of Service: Alicia Rogers, Alicia RMETTE S. 05/25/2023 8:45 A M Medical Record Number: [id number] Patient Account Number: [id number] Date of Birth/Sex: Treating RN: 09-Nov-1960 (62 y.o. Alicia Rogers Primary Care Provider: Leanord [place], [place] Other Clinician: Referring Provider: Treating Provider/Extender: RO BSO N, MICHA EL G FELIZ [place], [place] Weeks in Treatment: 0 Debridement Performed for Assessment: Wound #1 Left Trochanter Performed By: Physician Maxwell Caul, MD Debridement Type: Chemical/Enzymatic/Mechanical Agent Used: saline gauze Level of Consciousness (Pre-procedure): Awake and Alert Pre-procedure Verification/Time Out No Taken: Start Time: 09:35 Percent of Wound Bed Debrided: Instrument: Other : saline gauze Bleeding: Minimum Hemostasis Achieved: Pressure End Time: 09:37 Procedural Pain: 0 Post Procedural Pain: 0 Response to Treatment: Procedure was tolerated well Level of Consciousness (Post-  Awake and Alert procedure): Alicia Rogers, Alicia Rogers ([id number]) [id number].pdf Page 2 of 9 Post Debridement Measurements of Total Wound Length: (cm) 2 Stage: Category/Stage IV Width: (cm) 5 Depth: (cm) 0.7 Volume: (cm) 5.498 Character of Wound/Ulcer Post Debridement: Improved Post Procedure Diagnosis Same as Pre-procedure Electronic Signature(s) Signed: 05/25/2023 10:47:26 AM By: Yevonne Pax RN Signed: 05/26/2023 3:33:32 PM By: Baltazar Najjar MD Entered By: Yevonne Pax on 05/25/2023 07:47:26 -------------------------------------------------------------------------------- Debridement Details Patient Name: Date of Service: Alicia Rogers, Alicia RMETTE S. 05/25/2023 8:45 A M Medical Record Number: [id number] Patient Account Number: [id number] Date of Birth/Sex: Treating RN: [birth year] (62 y.o. Alicia Rogers Primary Care Provider: Leanord [place], [place] Other Clinician: Referring Provider: Treating Provider/Extender: RO BSO N, MICHA EL G FELIZ [place], [place] Weeks in Treatment: 0 Debridement Performed for Assessment: Wound #2 Right,Lateral Ankle Performed By: Physician Maxwell Caul, MD Debridement Type: Chemical/Enzymatic/Mechanical Agent Used: Santyl Severity of Tissue Pre Debridement: Fat layer exposed Level of Consciousness (Pre-procedure): Awake and Alert Pre-procedure Verification/Time Out No Taken: Start Time: 09:35 Percent of Wound Bed Debrided: Instrument: Other : saline gauze Bleeding: Minimum Hemostasis Achieved: Pressure End Time: 09:39 Procedural Pain: 0 Post Procedural Pain: 0 Response to Treatment: Procedure was tolerated well Level of Consciousness (Post- Awake and Alert procedure): Post Debridement Measurements of Total Wound Length: (cm) 1 Width: (cm) 0.7 Depth: (cm) 0.3 Volume: (cm) 0.165 Character of Wound/Ulcer Post Debridement: Improved Severity of Tissue Post Debridement: Fat layer exposed Post Procedure Diagnosis Same  as Pre-procedure Electronic Signature(s) Signed: 05/25/2023 10:48:27 AM By: Yevonne Pax RN Signed: 05/26/2023 3:33:32 PM By: Baltazar Najjar MD Entered By: Yevonne Pax on 05/25/2023 07:48:27 Alicia Rogers ([id number]) [id number] Page 3 of 9 -------------------------------------------------------------------------------- HPI Details Patient Name: Date of Service: Alicia Rogers 05/25/2023 8:45 A M Medical Record Number: [id number] Patient Account Number: [id number] Date of Birth/Sex: Treating RN: January 17, 1961 (61 y.o. Alicia Rogers Primary Care Provider: [place] Other Clinician: Referring Provider:

## 2023-05-30 NOTE — Progress Notes (Signed)
Alicia, Rogers ([id number]) [phone] [id number] Page 1 of 5 Visit Report for 05/25/2023 Abuse Risk Screen Details Patient Name: Date of Service: Alicia Rogers. 05/25/2023 8:45 A M Medical Record Number: [id number] Patient Account Number: [id number] Date of Birth/Sex: Treating RN: [birth year] (62 y.o. Freddy Finner Primary Care Cassondra Stachowski: [place] Other Clinician: Referring Chrishana Spargur: Treating Astra Gregg/Extender: RO BSO N, MICHA EL G FELIZ [place]ENDE[place], [place] Weeks in Treatment: 0 Abuse Risk Screen Items Answer ABUSE RISK SCREEN: Has anyone close to you tried to hurt or harm you recentlyo No Do you feel uncomfortable with anyone in your familyo No Has anyone forced you do things that you didnt want to doo No Electronic Signature(Rogers) Signed: 05/30/2023 8:00:25 AM By: Yevonne Pax RN Entered By: Yevonne Pax on 05/25/2023 06:14:20 -------------------------------------------------------------------------------- Activities of Daily Living Details Patient Name: Date of Service: Alicia Rogers 05/25/2023 8:45 A M Medical Record Number: [id number] Patient Account Number: [id number] Date of Birth/Sex: Treating RN: [birth year] (62 y.o. Freddy Finner Primary Care Takako Minckler: [place] Other Clinician: Referring Elyssia Strausser: Treating Rubby Barbary/Extender: RO BSO N, MICHA EL G FELIZ [place]EN[place] Weeks in Treatment: 0 Activities of Daily Living Items Answer Activities of Daily Living (Please select one for each item) Drive Automobile Not Able T Medications ake Need Assistance Use T elephone Completely Able Care for Appearance Need Assistance Use T oilet Need Assistance Bath / Shower Need Assistance Dress Self Need Assistance Feed Self Completely Able Walk Need Assistance Get In / Out Bed Need Assistance Housework Not Alicia Rogers, Alicia Rogers ([id number]) [phone] [id number] Page 2 of 5 Prepare  Meals Not Able Handle Money Not Able Shop for Self Not Able Electronic Signature(Rogers) Signed: 05/30/2023 8:00:25 AM By: Yevonne Pax RN Entered By: Yevonne Pax on 05/25/2023 06:14:54 -------------------------------------------------------------------------------- Education Screening Details Patient Name: Date of Service: Alicia Rogers, Alicia Alicia Rogers. 05/25/2023 8:45 A M Medical Record Number: [id number] Patient Account Number: [id number] Date of Birth/Sex: Treating RN: October 19, 1960 (62 y.o. Freddy Finner Primary Care Geovonni Meyerhoff: [place] Other Clinician: Referring Holland Nickson: Treating Trace Cederberg/Extender: RO BSO N, MICHA EL G FEL[place] Weeks in Treatment: 0 Primary Learner Assessed: Patient Learning Preferences/Education Level/Primary Language Learning Preference: Explanation Highest Education Level: College or Above Preferred Language: English Cognitive Barrier Language Barrier: No Translator Needed: No Memory Deficit: No Emotional Barrier: No Cultural/Religious Beliefs Affecting Medical Care: No Physical Barrier Impaired Vision: Yes Glasses Impaired Hearing: No Decreased Hand dexterity: No Knowledge/Comprehension Knowledge Level: Medium Comprehension Level: High Ability to understand written instructions: High Ability to understand verbal instructions: High Motivation Anxiety Level: Anxious Cooperation: Cooperative Education Importance: Acknowledges Need Interest in Health Problems: Asks Questions Perception: Coherent Willingness to Engage in Self-Management High Activities: Readiness to Engage in Self-Management High Activities: Electronic Signature(Rogers) Signed: 05/30/2023 8:00:25 AM By: Yevonne Pax RN Entered By: Yevonne Pax on 05/25/2023 06:15:24 Alicia Rogers ([id number]) [id number] Page 3 of 5 -------------------------------------------------------------------------------- Fall Risk Assessment  Details Patient Name: Date of Service: Alicia Rogers. 05/25/2023 8:45 A M Medical Record Number: [id number] Patient Account Number: [id number] Date of Birth/Sex: Treating RN: [birth year] (62 y.o. Freddy Finner Primary Care Reo Portela: [place] Other Clinician: Referring Brittanny Levenhagen: Treating Emilliano Dilworth/Extender: RO BSO N, MICHA EL G F[place] Weeks in Treatment: 0 Fall Risk Assessment Items Have you had 2 or more falls in the last 12 monthso 0 No Have you had any fall that resulted in injury  in the last 12 monthso 0 No FALLS RISK SCREEN History of falling - immediate or within 3 months 0 No Secondary diagnosis (Do you have 2 or more medical diagnoseso) 0 No Ambulatory aid None/bed rest/wheelchair/nurse 0 Yes Crutches/cane/walker 0 No Furniture 0 No Intravenous therapy Access/Saline/Heparin Lock 0 No Gait/Transferring Normal/ bed rest/ wheelchair 0 Yes Weak (short steps with or without shuffle, stooped but able to lift head while walking, may seek 0 No support from furniture) Impaired (short steps with shuffle, may have difficulty arising from chair, head down, impaired 0 No balance) Mental Status Oriented to own ability 0 Yes Electronic Signature(Rogers) Signed: 05/30/2023 8:00:25 AM By: Yevonne Pax RN Entered By: Yevonne Pax on 05/25/2023 06:15:35 -------------------------------------------------------------------------------- Foot Assessment Details Patient Name: Date of Service: Alicia Rogers, Alicia Alicia Rogers. 05/25/2023 8:45 A M Medical Record Number: [id number] Patient Account Number: [id number] Date of Birth/Sex: Treating RN: 10/29/1960 (62 y.o. Freddy Finner Primary Care Daelyn Pettaway: [place] Other Clinician: Referring Robbi Spells: Treating Sanda Dejoy/Extender: RO BSO N, MICHA EL G FELIZ [place] Weeks in Treatment: 0 Foot Assessment Items [x]  Unable to perform due to altered mental [address] Alicia Rogers, Alicia Rogers  ([id number]) [id number] Page 4 of 5 + = Sensation present, - = Sensation absent, C = Callus, U = Ulcer R = Redness, W = Warmth, M = Maceration, PU = Pre-ulcerative lesion F = Fissure, Rogers = Swelling, D = Dryness Assessment Right: Left: Other Deformity: No No Prior Foot Ulcer: No No Prior Amputation: No No Charcot Joint: No No Ambulatory Status: Non-ambulatory Assistance Device: Wheelchair Gait: Surveyor, mining) Signed: 05/30/2023 8:00:25 AM By: Yevonne Pax RN Entered By: Yevonne Pax on 05/25/2023 06:16:23 -------------------------------------------------------------------------------- Nutrition Risk Screening Details Patient Name: Date of Service: Alicia Rogers. 05/25/2023 8:45 A M Medical Record Number: [id number] Patient Account Number: [id number] Date of Birth/Sex: Treating RN: 03-Mar-1961 (62 y.o. F) Epps, Carrie Primary Care Markan Cazarez: [place] Other Clinician: Referring Jahzara Slattery: Treating Margret Moat/Extender: RO BSO N, MICHA EL G [place] Weeks in Treatment: 0 Height (in): 62 Weight (lbs): 113 Body Mass Index (BMI): 20.7 Nutrition Risk Screening Items Score Screening NUTRITION RISK SCREEN: I have an illness or condition that made me change the kind and/or amount of food I eat 0 No I eat fewer than two meals per day 0 No I eat few fruits and vegetables, or milk products 0 No I have three or more drinks of beer, liquor or wine almost every day 0 No I have tooth or mouth problems that make it hard for me to eat 0 No Alicia Rogers, Alicia Rogers ([id number]) [id number] Page 5 of 5 I don't always have enough money to buy the food I need 0 No I eat alone most of the time 0 No I take three or more different prescribed or over-the-counter drugs a day 1 Yes Without wanting to, I have lost or gained 10 pounds in the last six months 0 No I am not always physically able to  shop, cook and/or feed myself 2 Yes Nutrition Protocols Good Risk Protocol Moderate Risk Protocol 0 Provide education on nutrition High Risk Proctocol Risk Level: Moderate Risk Score: 3 Electronic Signature(Rogers) Signed: 05/30/2023 8:00:25 AM By: Yevonne Pax RN Entered By: Yevonne Pax on 05/25/2023 06:16:00

## 2023-05-30 NOTE — Progress Notes (Addendum)
Alicia Rogers, Alicia Rogers ([id number]) [id number] Page 1 of 9 Visit Report for 05/25/2023 Allergy List Details Patient Name: Date of Service: Alicia Rogers. 05/25/2023 8:45 A M Medical Record Number: [id number] Patient Account Number: [id number] Date of Birth/Sex: Treating RN: 1961/06/07 (63 y.o. Alicia Rogers Primary Care Alicia Rogers: Alicia Rogers, [place] Other Clinician: Referring Alicia Rogers: Treating Alicia Rogers/Extender: RO BSO N, Alicia Rogers, [place] Weeks in Rogers: 0 Allergies Active Allergies penicillin lisinopril Allergy Notes Electronic Signature(Rogers) Signed: 05/30/2023 8:00:25 AM By: Yevonne Pax RN Entered By: Yevonne Pax on 05/25/2023 06:25:19 -------------------------------------------------------------------------------- Arrival Information Details Patient Name: Date of Service: Alicia Rogers, Alicia [name]. 05/25/2023 8:45 A M Medical Record Number: [id number] Patient Account Number: [id number] Date of Birth/Sex: Treating RN: September 04, 1960 (62 y.o. Alicia Rogers Primary Care Yona Stansbury: Alicia Rogers, [place] Other Clinician: Referring Annete Ayuso: Treating Cleaven Demario/Extender: RO BSO N, Alicia Rogers, [place] Weeks in Rogers: 0 Visit Information Patient Arrived: Wheel Chair Arrival Time: 09:07 Accompanied By: self Transfer Assistance: Manual Patient Identification Verified: Yes Secondary Verification Process Completed: Yes Patient Requires Transmission-Based Precautions: No Patient Has Alerts: Yes Patient Alerts: diabetic Electronic Signature(sZAKYIA, BAYAT ([id number]) [id number] Page 2 of 9 Signed: 05/30/2023 8:00:25 AM By: Yevonne Pax RN Entered By: Yevonne Pax on 05/25/2023 06:10:05 -------------------------------------------------------------------------------- Clinic Level of Care Assessment Details Patient Name: Date of Service: Alicia Rogers 05/25/2023 8:45 A  M Medical Record Number: [id number] Patient Account Number: [id number] Date of Birth/Sex: Treating RN: [birth year] (62 y.o. Alicia Rogers Primary Care Kimon Loewen: Alicia Rogers, [place] Other Clinician: Referring Sakari Alkhatib: Treating Anayia Eugene/Extender: RO BSO N, Alicia Rogers, [place] Weeks in Rogers: 0 Clinic Level of Care Assessment Items TOOL 1 Quantity Score X- 1 0 Use when EandM and Procedure is performed on INITIAL visit ASSESSMENTS - Nursing Assessment / Reassessment X- 1 20 General Physical Exam (combine w/ comprehensive assessment (listed just below) when performed on new pt. evals) X- 1 25 Comprehensive Assessment (HX, ROS, Risk Assessments, Wounds Hx, etc.) ASSESSMENTS - Wound and Skin Assessment / Reassessment []  - 0 Dermatologic / Skin Assessment (not related to wound area) ASSESSMENTS - Ostomy and/or Continence Assessment and Care []  - 0 Incontinence Assessment and Management []  - 0 Ostomy Care Assessment and Management (repouching, etc.) PROCESS - Coordination of Care X - Simple Patient / Family Education for ongoing care 1 15 []  - 0 Complex (extensive) Patient / Family Education for ongoing care []  - 0 Staff obtains Chiropractor, Records, T Results / Process Orders est X- 1 10 Staff telephones HHA, Nursing Homes / Clarify orders / etc []  - 0 Routine Transfer to another Facility (non-emergent condition) []  - 0 Routine Hospital Admission (non-emergent condition) X- 1 15 New Admissions / Manufacturing engineer / Ordering NPWT Apligraf, etc. , []  - 0 Emergency Hospital Admission (emergent condition) PROCESS - Special Needs []  - 0 Pediatric / Minor Patient Management []  - 0 Isolation Patient Management []  - 0 Hearing / Language / Visual special needs []  - 0 Assessment of Community assistance (transportation, D/C planning, etc.) []  - 0 Additional assistance / Altered mentation []  - 0 Support Surface(Rogers) Assessment (bed, cushion, seat,  etc.) INTERVENTIONS - Miscellaneous []  - 0 External ear exam []  - 0 Patient Transfer (multiple staff / Nurse, adult / Similar devices) []  - 0 Simple Staple / Suture removal (25 or less) Alicia Rogers, Alicia Rogers ([id number]) [phone].pdf Page 3 of 9 []  - 0 Complex Staple /  Suture removal (26 or more) []  - 0 Hypo/Hyperglycemic Management (do not check if billed separately) X- 1 15 Ankle / Brachial Index (ABI) - do not check if billed separately Has the patient been seen at the hospital within the last three years: Yes Total Score: 100 Level Of Care: New/Established - Level 3 Electronic Signature(Rogers) Signed: 05/30/2023 8:00:25 AM By: Yevonne Pax RN Entered By: Yevonne Pax on 05/25/2023 07:54:43 -------------------------------------------------------------------------------- Lower Extremity Assessment Details Patient Name: Date of Service: Alicia Rogers. 05/25/2023 8:45 A M Medical Record Number: [id number] Patient Account Number: [id number] Date of Birth/Sex: Treating RN: 14-Feb-1961 (62 y.o. F) Epps, Rogers Primary Care Karyssa Amaral: Alicia Rogers, [place] Other Clinician: Referring Brantlee Penn: Treating Leighanna Kirn/Extender: RO BSO N, Alicia Rogers, [place] Weeks in Rogers: 0 Edema Assessment Assessed: [Left: No] [Right: No] Edema: [Left: Ye] [Right: Rogers] Calf Left: Right: Point of Measurement: 34 cm From Medial Instep 29 cm Ankle Left: Right: Point of Measurement: 10 cm From Medial Instep 22 cm Knee To Floor Left: Right: From Medial Instep 42 cm Vascular Assessment Pulses: Dorsalis Pedis Palpable: [Right:Yes] Doppler Audible: [Right:Yes] Extremity colors, hair growth, and conditions: Extremity Color: [Right:Hyperpigmented] Hair Growth on Extremity: [Right:No] Temperature of Extremity: [Right:Warm] Capillary Refill: [Right:< 3 seconds] Dependent Rubor: [Right:No] Blanched when Elevated: [Right:No] Lipodermatosclerosis:  [Right:No] Blood Pressure: Brachial: [Right:155] Ankle: [Right:Dorsalis Pedis: 64 0.41] Toe Nail Assessment Left: Right: Alicia Rogers, Alicia Rogers ([id number]) [phone].pdf Page 4 of 9 Thick: No Discolored: No Deformed: No Improper Length and Hygiene: No Electronic Signature(Rogers) Signed: 05/30/2023 8:00:25 AM By: Yevonne Pax RN Entered By: Yevonne Pax on 05/25/2023 06:31:50 -------------------------------------------------------------------------------- Multi Wound Chart Details Patient Name: Date of Service: Alicia Rogers, Alicia [name]. 05/25/2023 8:45 A M Medical Record Number: [id number] Patient Account Number: [id number] Date of Birth/Sex: Treating RN: June 27, 1961 (62 y.o. F) Epps, Rogers Primary Care Davyd Podgorski: Alicia Rogers, [place] Other Clinician: Referring Linzi Ohlinger: Treating Diego Ulbricht/Extender: RO BSO N, Alicia Rogers, [place] Weeks in Rogers: 0 Vital Signs Height(in): 62 Pulse(bpm): 51 Weight(lbs): 113 Blood Pressure(mmHg): 155/61 Body Mass Index(BMI): 20.7 Temperature(F): 97.9 Respiratory Rate(breaths/min): 16 [1:Photos:] [N/A:N/A] Left Trochanter Right, Lateral Ankle N/A Wound Location: Gradually Appeared Gradually Appeared N/A Wounding Event: Pressure Ulcer Diabetic Wound/Ulcer of the Lower N/A Primary Etiology: Extremity Chronic Obstructive Pulmonary Chronic Obstructive Pulmonary N/A Comorbid History: Disease (COPD), Type II Diabetes Disease (COPD), Type II Diabetes 04/03/2023 05/03/2023 N/A Date Acquired: 0 0 N/A Weeks of Rogers: Open Open N/A Wound Status: No No N/A Wound Recurrence: 2x5x0.7 1x0.7x0.3 N/A Measurements L x W x D (cm) 7.854 0.55 N/A A (cm) : rea 5.498 0.165 N/A Volume (cm) : 12 Starting Position 1 (o'clock): 12 Ending Position 1 (o'clock): 1.2 Maximum Distance 1 (cm): Yes No N/A Undermining: Category/Stage IV Grade 1 N/A Classification: Medium Medium N/A Exudate A mount: Serosanguineous  Serosanguineous N/A Exudate Type: red, brown red, brown N/A Exudate Color: Medium (34-66%) Large (67-100%) N/A Granulation A mount: Red Red, Pink N/A Granulation Quality: Medium (34-66%) Small (1-33%) N/A Necrotic A mount: Fat Layer (Subcutaneous Tissue): Yes Fat Layer (Subcutaneous Tissue): Yes N/A Exposed Structures: Tendon: Yes Fascia: No Fascia: No Tendon: No Alicia Rogers, Alicia Rogers ([id number]) [id number] Page 5 of 9 Muscle: No Muscle: No Joint: No Joint: No Bone: No Bone: No None None N/A Epithelialization: Rogers Notes Electronic Signature(Rogers) Signed: 05/26/2023 3:33:32 PM By: Baltazar Najjar MD Entered By: Baltazar Najjar on 05/25/2023 07:40:48 -------------------------------------------------------------------------------- Multi-Disciplinary Care Plan Details Patient Name: Date of Service: Alicia Rogers, Alicia RMETTE  Rogers. 05/25/2023 8:45 A M Medical Record Number: [id number] Patient Account Number: [id number] Date of Birth/Sex: Treating RN: 06-05-1961 (62 y.o. Alicia Rogers Primary Care Daysha Ashmore: Alicia Rogers, [place] Other Clinician: Referring Jadakiss Barish: Treating Sherida Dobkins/Extender: RO BSO N, Alicia Rogers, [place] Weeks in Rogers: 0 Active Inactive Electronic Signature(Rogers) Signed: 06/07/2023 4:47:17 PM By: Elliot Gurney, BSN, RN, CWS, Kim RN, BSN Signed: 06/15/2023 4:43:41 PM By: Yevonne Pax RN Previous Signature: 05/30/2023 8:00:25 AM Version By: Yevonne Pax RN Entered By: Elliot Gurney, BSN, RN, CWS, Kim on 06/07/2023 13:47:16 -------------------------------------------------------------------------------- Pain Assessment Details Patient Name: Date of Service: Alicia Rogers, Alicia [name]. 05/25/2023 8:45 A M Medical Record Number: [id number] Patient Account Number: [id number] Date of Birth/Sex: Treating RN: 05-01-1961 (62 y.o. Alicia Rogers Primary Care Shahram Alexopoulos: Alicia Rogers, [place] Other Clinician: Referring Chevelle Durr: Treating  Wade Asebedo/Extender: RO BSO N, Alicia Rogers, [place] Weeks in Rogers: 0 Active Problems Location of Pain Severity and Description of Pain Patient Has Paino No [address] Alicia Rogers, Alicia Rogers ([id number]) [id number] Page 6 of 9 Pain Management and Medication Current Pain Management: Electronic Signature(Rogers) Signed: 05/30/2023 8:00:25 AM By: Yevonne Pax RN Entered By: Yevonne Pax on 05/25/2023 06:10:22 -------------------------------------------------------------------------------- Patient/Caregiver Education Details Patient Name: Date of Service: Alicia Rogers, Alicia [name]. 10/23/2024andnbsp8:45 A M Medical Record Number: [id number] Patient Account Number: [id number] Date of Birth/Gender: Treating RN: 1961/01/29 (62 y.o. Alicia Rogers Primary Care Physician: Alicia Rogers, [place] Other Clinician: Referring Physician: Treating Physician/Extender: RO BSO N, Alicia Rogers, [place] Weeks in Rogers: 0 Education Assessment Education Provided To: Patient Education Topics Provided Welcome [facility]New [facility] Packet: o Handouts: Welcome [facility] o Methods: Explain/Verbal Responses: State content correctly Electronic Signature(Rogers) Signed: 05/30/2023 8:00:25 AM By: Yevonne Pax RN Entered By: Yevonne Pax on 05/25/2023 06:35:02 Alicia Rogers, Alicia Rogers ([id number]) [id number] Page 7 of 9 -------------------------------------------------------------------------------- Wound Assessment Details Patient Name: Date of Service: Alicia Rogers 05/25/2023 8:45 A M Medical Record Number: [id number] Patient Account Number: [id number] Date of Birth/Sex: Treating RN: [birth year] (62 y.o. Alicia Rogers Primary Care Reigan Tolliver: Alicia Rogers, [place] Other Clinician: Referring Dream Harman: Treating Nelta Caudill/Extender: RO BSO N, Alicia Rogers, [place] Weeks in Rogers:  0 Wound Status Wound Number: 1 Primary Pressure Ulcer Etiology: Wound Location: Left Trochanter Wound Status: Open Wounding Event: Gradually Appeared Comorbid Chronic Obstructive Pulmonary Disease (COPD), Type II Date Acquired: 04/03/2023 History: Diabetes Weeks Of Rogers: 0 Clustered Wound: No Photos Wound Measurements Length: (cm) 2 Width: (cm) 5 Depth: (cm) 0.7 Area: (cm) 7.854 Volume: (cm) 5.498 % Reduction in Area: % Reduction in Volume: Epithelialization: None Undermining: Yes Starting Position (o'clock): 12 Ending Position (o'clock): 12 Maximum Distance: (cm) 1.2 Wound Description Classification: Category/Stage IV Exudate Amount: Medium Exudate Type: Serosanguineous Exudate Color: red, brown Foul Odor After Cleansing: No Slough/Fibrino Yes Wound Bed Granulation Amount: Medium (34-66%) Exposed Structure Granulation Quality: Red Fascia Exposed: No Necrotic Amount: Medium (34-66%) Fat Layer (Subcutaneous Tissue) Exposed: Yes Necrotic Quality: Adherent Slough Tendon Exposed: Yes Muscle Exposed: No Joint Exposed: No Bone Exposed: No Electronic Signature(Rogers) Signed: 05/30/2023 8:00:25 AM By: Yevonne Pax RN Entered By: Yevonne Pax on 05/25/2023 06:29:36 Alicia Rogers ([id number]) [id number] Page 8 of 9 -------------------------------------------------------------------------------- Wound Assessment Details Patient Name: Date of Service: Alicia Rogers 05/25/2023 8:45 A M Medical Record Number: [id number] Patient Account Number: [id number] Date of Birth/Sex: Treating RN: 08/18/1960 (62 y.o. F)  Yevonne Pax Primary Care Koichi Platte: Alicia Rogers, [place] Other Clinician: Referring Akera Snowberger: Treating Jesusa Stenerson/Extender: RO BSO N, Alicia Rogers, [place] Weeks in Rogers: 0 Wound Status Wound Number: 2 Primary Diabetic Wound/Ulcer of the Lower Extremity Etiology: Wound Location: Right, Lateral Ankle Wound  Status: Open Wounding Event: Gradually Appeared Comorbid Chronic Obstructive Pulmonary Disease (COPD), Type II Date Acquired: 05/03/2023 History: Diabetes Weeks Of Rogers: 0 Clustered Wound: No Photos Wound Measurements Length: (cm) 1 Width: (cm) 0.7 Depth: (cm) 0.3 Area: (cm) 0.55 Volume: (cm) 0.165 % Reduction in Area: % Reduction in Volume: Epithelialization: None Tunneling: No Undermining: No Wound Description Classification: Grade 1 Exudate Amount: Medium Exudate Type: Serosanguineous Exudate Color: red, brown Foul Odor After Cleansing: No Slough/Fibrino Yes Wound Bed Granulation Amount: Large (67-100%) Exposed Structure Granulation Quality: Red, Pink Fascia Exposed: No Necrotic Amount: Small (1-33%) Fat Layer (Subcutaneous Tissue) Exposed: Yes Necrotic Quality: Adherent Slough Tendon Exposed: No Muscle Exposed: No Joint Exposed: No Bone Exposed: No Electronic Signature(Rogers) Signed: 05/30/2023 8:00:25 AM By: Yevonne Pax RN Entered By: Yevonne Pax on 05/25/2023 06:30:52 Alicia Rogers ([id number]) [id number] Page 9 of 9 -------------------------------------------------------------------------------- Vitals Details Patient Name: Date of Service: Alicia Rogers 05/25/2023 8:45 A M Medical Record Number: [id number] Patient Account Number: [id number] Date of Birth/Sex: Treating RN: 03-13-1961 (62 y.o. F) Epps, Rogers Primary Care Terrian Ridlon: Alicia Rogers, [place] Other Clinician: Referring Dianelly Ferran: Treating Jamilah Jean/Extender: RO BSO N, Alicia Rogers, [place] Weeks in Rogers: 0 Vital Signs Time Taken: 09:10 Temperature (F): 97.9 Height (in): 62 Pulse (bpm): 51 Source: Stated Respiratory Rate (breaths/min): 16 Weight (lbs): 113 Blood Pressure (mmHg): 155/61 Source: Stated Reference Range: 80 - 120 mg / dl Body Mass Index (BMI): 20.7 Electronic Signature(Rogers) Signed: 05/30/2023 8:00:25 AM By: Yevonne Pax RN Entered By: Yevonne Pax on 05/25/2023 06:10:57

## 2023-06-01 ENCOUNTER — Ambulatory Visit: Payer: 59 | Admitting: Internal Medicine

## 2023-06-03 ENCOUNTER — Other Ambulatory Visit (INDEPENDENT_AMBULATORY_CARE_PROVIDER_SITE_OTHER): Payer: Self-pay | Admitting: Internal Medicine

## 2023-06-03 DIAGNOSIS — L97328 Non-pressure chronic ulcer of left ankle with other specified severity: Secondary | ICD-10-CM

## 2023-06-07 ENCOUNTER — Inpatient Hospital Stay: Payer: 59

## 2023-06-07 ENCOUNTER — Encounter: Payer: Self-pay | Admitting: Nurse Practitioner

## 2023-06-07 ENCOUNTER — Inpatient Hospital Stay: Payer: 59 | Attending: Internal Medicine

## 2023-06-07 ENCOUNTER — Inpatient Hospital Stay: Payer: 59 | Admitting: Nurse Practitioner

## 2023-06-24 ENCOUNTER — Ambulatory Visit (INDEPENDENT_AMBULATORY_CARE_PROVIDER_SITE_OTHER): Payer: 59 | Admitting: Nurse Practitioner

## 2023-06-24 ENCOUNTER — Ambulatory Visit (INDEPENDENT_AMBULATORY_CARE_PROVIDER_SITE_OTHER): Payer: 59

## 2023-06-24 ENCOUNTER — Encounter (INDEPENDENT_AMBULATORY_CARE_PROVIDER_SITE_OTHER): Payer: Self-pay | Admitting: Nurse Practitioner

## 2023-06-24 ENCOUNTER — Telehealth (INDEPENDENT_AMBULATORY_CARE_PROVIDER_SITE_OTHER): Payer: Self-pay

## 2023-06-24 VITALS — BP 133/81 | HR 63 | Resp 18 | Ht 62.0 in | Wt 111.4 lb

## 2023-06-24 DIAGNOSIS — I10 Essential (primary) hypertension: Secondary | ICD-10-CM | POA: Diagnosis not present

## 2023-06-24 DIAGNOSIS — L97328 Non-pressure chronic ulcer of left ankle with other specified severity: Secondary | ICD-10-CM | POA: Diagnosis not present

## 2023-06-24 DIAGNOSIS — I70299 Other atherosclerosis of native arteries of extremities, unspecified extremity: Secondary | ICD-10-CM

## 2023-06-24 DIAGNOSIS — L97909 Non-pressure chronic ulcer of unspecified part of unspecified lower leg with unspecified severity: Secondary | ICD-10-CM | POA: Diagnosis not present

## 2023-06-24 DIAGNOSIS — N184 Chronic kidney disease, stage 4 (severe): Secondary | ICD-10-CM

## 2023-06-24 DIAGNOSIS — N1832 Chronic kidney disease, stage 3b: Secondary | ICD-10-CM | POA: Diagnosis not present

## 2023-06-24 NOTE — Progress Notes (Incomplete)
Subjective:    Patient ID: Alicia Rogers, female    DOB: [birth year], 62 y.o.   MRN: [id number] Chief Complaint  Patient presents with  . Follow-up    UE v-map/arterial/consult. avf creation. singh    HPI  Review of Systems     Objective:   Physical Exam  BP 133/81 (BP Location: Left Arm)   Pulse 63   Resp 18   Ht 5\' 2"  (1.575 m)   Wt 111 lb 6.4 oz (50.5 kg)   BMI 20.38 kg/m   Past Medical History:  Diagnosis Date  . Depression   . Diabetes mellitus without complication (HCC)   . Diabetes mellitus, type II (HCC)   . Hand swelling 09/27/2021  . Hypertension   . Schizophrenia (HCC)   . Stage 3b chronic kidney disease (HCC) 12/22/2021    Social History   Socioeconomic History  . Marital status: Single    Spouse name: Not on file  . Number of children: Not on file  . Years of education: Not on file  . Highest education level: Not on file  Occupational History  . Not on file  Tobacco Use  . Smoking status: Every Day    Current packs/day: 1.00    Average packs/day: 1 pack/day for 45.2 years (45.2 ttl pk-yrs)    Types: Cigarettes    Start date: 04/21/1978  . Smokeless tobacco: Never  Vaping Use  . Vaping status: Former  Substance and Sexual Activity  . Alcohol use: No    Alcohol/week: 0.0 standard drinks of alcohol  . Drug use: No  . Sexual activity: Not Currently  Other Topics Concern  . Not on file  Social History Narrative  . Not on file   Social Determinants of Health   Financial Resource Strain: Medium Risk (03/04/2023)   Received from [facility]   Overall Financial Resource Strain (CARDIA)   . Difficulty of Paying Living Expenses: Somewhat hard  Food Insecurity: No Food Insecurity (03/04/2023)   Received from [facility]   Hunger Vital Sign   . Worried About Programme researcher, broadcasting/film/video in the Last Year: Never true   . Ran Out of Food in the Last Year: Never true  Transportation Needs: No Transportation Needs  (03/04/2023)   Received from [facility]   [facility] - Transportation   . In the past 12 months, has lack of transportation kept you from medical appointments or from getting medications?: No   . Lack of Transportation (Non-Medical): No  Physical Activity: Not on file  Stress: Not on file  Social Connections: Unknown (09/28/2022)   Received from [facility], [facility]   Social Network   . Social Network: Not on file  Intimate Partner Violence: Unknown (09/28/2022)   Received from [facility], [facility]   HITS   . Physically Hurt: Not on file   . Insult or Talk Down To: Not on file   . Threaten Physical Harm: Not on file   . Scream or Curse: Not on file    Past Surgical History:  Procedure Laterality Date  . COLONOSCOPY WITH PROPOFOL N/A 07/21/2022   Procedure: COLONOSCOPY WITH PROPOFOL;  Surgeon: Toney Reil, MD;  Location: [facility] ENDOSCOPY;  Service: Gastroenterology;  Laterality: N/A;  . colonscopy    . ESOPHAGOGASTRODUODENOSCOPY (EGD) WITH PROPOFOL N/A 07/21/2022   Procedure: ESOPHAGOGASTRODUODENOSCOPY (EGD) WITH PROPOFOL;  Surgeon: Toney Reil, MD;  Location: [facility] ENDOSCOPY;  Service: Gastroenterology;  Laterality: N/A;  Family History  Problem Relation Age of Onset  . Diabetes Mother   . Glaucoma Mother   . Heart disease Mother   . Heart attack Mother   . Depression Mother   . Hypertension Mother   . Diabetes Father   . Glaucoma Father   . Hypertension Father   . Prostate cancer Father   . Diabetes Sister   . Depression Sister   . Post-traumatic stress disorder Sister     Allergies  Allergen Reactions  . Penicillins     Rash  . Lisinopril        Latest Ref Rng & Units 05/16/2023    5:45 AM 05/15/2023    6:18 AM 05/14/2023    5:26 AM  CBC  WBC 4.0 - 10.5 K/uL 10.1  10.9  12.8   Hemoglobin 12.0 - 15.0 g/dL 8.6  8.6  8.7   Hematocrit 36.0 - 46.0 % 28.2  28.5  29.4   Platelets 150 - 400 K/uL 486  552  601        CMP     Component Value Date/Time   NA 141 05/16/2023 0545   NA 140 09/03/2022 1025   NA 142 04/23/2014 0128   K 5.2 (H) 05/16/2023 0545   K 3.4 (L) 04/23/2014 0128   CL 107 05/16/2023 0545   CL 105 04/23/2014 0128   CO2 23 05/16/2023 0545   CO2 30 04/23/2014 0128   GLUCOSE 110 (H) 05/16/2023 0545   GLUCOSE 202 (H) 04/23/2014 0128   BUN 39 (H) 05/16/2023 0545   BUN 69 (H) 09/03/2022 1025   BUN 5 (L) 04/23/2014 0128   CREATININE 3.36 (H) 05/16/2023 0545   CREATININE 3.37 (H) 03/07/2023 1440   CREATININE 0.89 04/23/2014 0128   CALCIUM 9.7 05/16/2023 0545   CALCIUM 9.4 04/23/2014 0128   PROT 7.1 05/07/2023 1619   PROT 6.7 09/03/2022 1025   ALBUMIN 2.0 (L) 05/07/2023 1619   ALBUMIN 3.6 (L) 09/03/2022 1025   AST 22 05/07/2023 1619   AST 22 03/07/2023 1440   ALT 22 05/07/2023 1619   ALT 31 03/07/2023 1440   ALKPHOS 105 05/07/2023 1619   BILITOT 0.5 05/07/2023 1619   BILITOT 0.3 03/07/2023 1440   EGFR 13 (L) 09/03/2022 1025   GFRNONAA 15 (L) 05/16/2023 0545   GFRNONAA 15 (L) 03/07/2023 1440   GFRNONAA >60 04/23/2014 0128     No results found.     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  This represents a limb threatening ischemia and places the patient at the risk for right limb loss.  Patient should undergo angiography of the right lower extremity with the hope for intervention for limb salvage.  The risks and benefits as well as the alternative therapies was discussed in detail with the patient.  All questions were answered.  Patient agrees to proceed with right angiography.  The patient will follow up with me in the office after the procedure.   2. Chronic kidney disease, stage 4 (severe) (HCC) Recommend:  At this time the patient does not have  appropriate extremity access for dialysis  Patient should have a left brachial axillary graft  created.  The risks, benefits and alternative therapies were reviewed in detail with the patient.  All questions were answered.  The patient agrees to  proceed with surgery.   The patient will follow up with me in the office after the surgery.  3. Essential hypertension Continue antihypertensive medications as already ordered, these medications have been reviewed and there are no changes at this time.   Current Outpatient Medications on File Prior to Visit  Medication Sig Dispense Refill  . allopurinol (ZYLOPRIM) 100 MG tablet Take 100 mg by mouth daily.    Marland Kitchen amLODipine (NORVASC) 10 MG tablet Take 1 tablet (10 mg total) by mouth daily.    Marland Kitchen aspirin EC 81 MG tablet Take 81 mg by mouth daily.    Marland Kitchen BREZTRI AEROSPHERE 160-9-4.8 MCG/ACT AERO Inhale 2 puffs into the lungs in the morning and at bedtime. Start taking after antibiotics are completed. 10.7 g 11  . Bulk Chemicals (POLYOX WSR-301) POWD SMARTSIG:17 Gram(s) By Mouth Daily PRN    . buPROPion (WELLBUTRIN) 100 MG tablet Take 100 mg by mouth daily.    . cefadroxil (DURICEF) 500 MG/5ML suspension Take 500 mg by mouth every 12 (twelve) hours.    . cloNIDine (CATAPRES) 0.1 MG tablet Take 1 tablet (0.1 mg total) by mouth 3 (three) times daily.    . collagenase (SANTYL) 250 UNIT/GM ointment Apply topically daily.    . cyclobenzaprine (FLEXERIL) 10 MG tablet Take 1 tablet (10 mg total) by mouth at bedtime. (Patient taking differently: Take 10 mg by mouth at bedtime as needed.) 30 tablet 0  . dapagliflozin propanediol (FARXIGA) 5 MG TABS tablet Take 5 mg by mouth daily.    . ergocalciferol (VITAMIN D2) 1.25 MG (50000 UT) capsule Take 50,000 Units by mouth once a week. On Fridays    . escitalopram (LEXAPRO) 20 MG tablet Take 20 mg by mouth daily.    . feeding supplement (ENSURE ENLIVE / ENSURE PLUS) LIQD Take 237 mLs by mouth 2 (two) times daily between  meals.    . ferrous sulfate 325 (65 FE) MG tablet Take 325 mg by mouth 3 (three) times daily with meals.    . fluticasone (FLONASE) 50 MCG/ACT nasal spray Place 2 sprays into both nostrils daily. 48 mL 3  . furosemide (LASIX) 20 MG tablet Take 1 tablet (20 mg total) by mouth every other day.    . gabapentin (NEURONTIN) 100 MG capsule Take 100 mg by mouth 3 (three) times daily.    . hydrALAZINE (APRESOLINE) 50 MG tablet Take 1 tablet (50 mg total) by mouth 3 (three) times daily.    . isosorbide mononitrate (IMDUR) 60 MG 24 hr tablet Take 1 tablet (60 mg total) by mouth at bedtime. Hold if SBP <130    . linagliptin (TRADJENTA) 5 MG TABS tablet Take 5 mg by mouth daily.    Marland Kitchen lip balm (BLISTEX) OINT Apply 1 Application topically as needed for lip care.    . loperamide (IMODIUM) 2 MG capsule Take 2 mg by mouth 2 (two) times daily as needed for diarrhea or loose stools.    Marland Kitchen losartan (COZAAR) 100 MG tablet Take 1 tablet (100 mg total) by mouth daily.    . melatonin 3 MG TABS tablet Take 2 tablets (6 mg total) by mouth at bedtime as needed.    . Multiple Vitamin (MULTIVITAMIN) capsule Take 1 capsule by mouth daily.    Marland Kitchen nystatin (MYCOSTATIN) 100000 UNIT/ML suspension Take by mouth.    Marland Kitchen omeprazole (PRILOSEC) 40 MG capsule Take 40 mg by mouth daily.    . Paliperidone ER (INVEGA SUSTENNA) injection Inject 78 mg into the muscle every  30 (thirty) days.    . polyethylene glycol powder (GLYCOLAX/MIRALAX) 17 GM/SCOOP powder Take 17 g by mouth daily as needed.    . TYLENOL 325 MG tablet Take 650 mg by mouth every 8 (eight) hours as needed for mild pain. *DO NOT EXCEED 4GM OF TYLENOL IN 24 HOURS*     No current facility-administered medications on file prior to visit.    There are no Patient Instructions on file for this visit. No follow-ups on file.   Georgiana Spinner, NP

## 2023-06-24 NOTE — H&P (View-Only) (Signed)
 Subjective:    Patient ID: Alicia Rogers, female    DOB: 1961/06/05, 62 y.o.   MRN: [id number] Chief Complaint  Patient presents with   Follow-up    UE v-map/arterial/consult. avf creation. singh    The patient is a 62 year old female who presents today for multiple referrals.  The initial referral was for vein mapping due to her being chronic kidney disease stage V.  Additionally she has also developed a wound on her right lower extremity which has been very slow to heal which is noted that she had a referral for ABIs from the wound care center.  The patient is not on dialysis but she does have worsening creatinine and GFR numbers.  She is a somewhat poor historian but she has not having any shortness of breath or lower extremity edema noted today.  She notes that especially at night her right leg and foot hurt.  Today she underwent noninvasive studies which shows ABI og 0.56 on the right with an ABI of 0.82.  Additionally patient had upper extremity vein mapping which shows small diameter vessels with adequate access for creation of a left brachial axillary AV graft.    Review of Systems  Musculoskeletal:  Positive for gait problem.  Skin:  Positive for wound.  All other systems reviewed and are negative.      Objective:   Physical Exam Vitals reviewed.  HENT:     Head: Normocephalic.  Cardiovascular:     Rate and Rhythm: Normal rate.     Pulses:          Radial pulses are 2+ on the right side and 2+ on the left side.       Dorsalis pedis pulses are detected w/ Doppler on the right side and detected w/ Doppler on the left side.       Posterior tibial pulses are detected w/ Doppler on the right side and detected w/ Doppler on the left side.  Pulmonary:     Effort: Pulmonary effort is normal.  Skin:    General: Skin is warm and dry.  Neurological:     Mental Status: She is alert and oriented to person, place, and time.  Psychiatric:        Mood and Affect: Mood normal.         Behavior: Behavior normal.        Thought Content: Thought content normal.        Judgment: Judgment normal.     BP 133/81 (BP Location: Left Arm)   Pulse 63   Resp 18   Ht 5\' 2"  (1.575 m)   Wt 111 lb 6.4 oz (50.5 kg)   BMI 20.38 kg/m   Past Medical History:  Diagnosis Date   Depression    Diabetes mellitus without complication (HCC)    Diabetes mellitus, type II (HCC)    Hand swelling 09/27/2021   Hypertension    Schizophrenia (HCC)    Stage 3b chronic kidney disease (HCC) 12/22/2021    Social History   Socioeconomic History   Marital status: Single    Spouse name: Not on file   Number of children: Not on file   Years of education: Not on file   Highest education level: Not on file  Occupational History   Not on file  Tobacco Use   Smoking status: Every Day    Current packs/day: 1.00    Average packs/day: 1 pack/day for 45.2 years (45.2 ttl pk-yrs)    Types:  Cigarettes    Start date: 04/21/1978   Smokeless tobacco: Never  Vaping Use   Vaping status: Former  Substance and Sexual Activity   Alcohol use: No    Alcohol/week: 0.0 standard drinks of alcohol   Drug use: No   Sexual activity: Not Currently  Other Topics Concern   Not on file  Social History Narrative   Not on file   Social Determinants of Health   Financial Resource Strain: Medium Risk (03/04/2023)   Received from [facility]   Overall Financial Resource Strain (CARDIA)    Difficulty of Paying Living Expenses: Somewhat hard  Food Insecurity: No Food Insecurity (03/04/2023)   Received from [facility]   Hunger Vital Sign    Worried About Running Out of Food in the Last Year: Never true    Ran Out of Food in the Last Year: Never true  Transportation Needs: No Transportation Needs (03/04/2023)   Received from [facility] - Transportation    In the past 12 months, has lack of transportation kept you from medical appointments or from  getting medications?: No    Lack of Transportation (Non-Medical): No  Physical Activity: Not on file  Stress: Not on file  Social Connections: Unknown (09/28/2022)   Received from [facility]   Social Network    Social Network: Not on file  Intimate Partner Violence: Unknown (09/28/2022)   Received from [facility]   HITS    Physically Hurt: Not on file    Insult or Talk Down To: Not on file    Threaten Physical Harm: Not on file    Scream or Curse: Not on file    Past Surgical History:  Procedure Laterality Date   COLONOSCOPY WITH PROPOFOL N/A 07/21/2022   Procedure: COLONOSCOPY WITH PROPOFOL;  Surgeon: Toney Reil, MD;  Location: [facility] ENDOSCOPY;  Service: Gastroenterology;  Laterality: N/A;   colonscopy     ESOPHAGOGASTRODUODENOSCOPY (EGD) WITH PROPOFOL N/A 07/21/2022   Procedure: ESOPHAGOGASTRODUODENOSCOPY (EGD) WITH PROPOFOL;  Surgeon: Toney Reil, MD;  Location: [facility] ENDOSCOPY;  Service: Gastroenterology;  Laterality: N/A;    Family History  Problem Relation Age of Onset   Diabetes Mother    Glaucoma Mother    Heart disease Mother    Heart attack Mother    Depression Mother    Hypertension Mother    Diabetes Father    Glaucoma Father    Hypertension Father    Prostate cancer Father    Diabetes Sister    Depression Sister    Post-traumatic stress disorder Sister     Allergies  Allergen Reactions   Penicillins     Rash   Lisinopril        Latest Ref Rng & Units 05/16/2023    5:45 AM 05/15/2023    6:18 AM 05/14/2023    5:26 AM  CBC  WBC 4.0 - 10.5 K/uL 10.1  10.9  12.8   Hemoglobin 12.0 - 15.0 g/dL 8.6  8.6  8.7   Hematocrit 36.0 - 46.0 % 28.2  28.5  29.4   Platelets 150 - 400 K/uL 486  552  601       CMP     Component Value Date/Time   NA 141 05/16/2023 0545   NA 140 09/03/2022 1025   NA 142 04/23/2014 0128   K 5.2 (H) 05/16/2023 0545   K 3.4 (L) 04/23/2014 0128   CL 107 05/16/2023  0545   CL  105 04/23/2014 0128   CO2 23 05/16/2023 0545   CO2 30 04/23/2014 0128   GLUCOSE 110 (H) 05/16/2023 0545   GLUCOSE 202 (H) 04/23/2014 0128   BUN 39 (H) 05/16/2023 0545   BUN 69 (H) 09/03/2022 1025   BUN 5 (L) 04/23/2014 0128   CREATININE 3.36 (H) 05/16/2023 0545   CREATININE 3.37 (H) 03/07/2023 1440   CREATININE 0.89 04/23/2014 0128   CALCIUM 9.7 05/16/2023 0545   CALCIUM 9.4 04/23/2014 0128   PROT 7.1 05/07/2023 1619   PROT 6.7 09/03/2022 1025   ALBUMIN 2.0 (L) 05/07/2023 1619   ALBUMIN 3.6 (L) 09/03/2022 1025   AST 22 05/07/2023 1619   AST 22 03/07/2023 1440   ALT 22 05/07/2023 1619   ALT 31 03/07/2023 1440   ALKPHOS 105 05/07/2023 1619   BILITOT 0.5 05/07/2023 1619   BILITOT 0.3 03/07/2023 1440   EGFR 13 (L) 09/03/2022 1025   GFRNONAA 15 (L) 05/16/2023 0545   GFRNONAA 15 (L) 03/07/2023 1440   GFRNONAA >60 04/23/2014 0128     No results found.     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  This represents a limb threatening ischemia and places the patient at the risk for right limb loss.  Patient should undergo angiography of the right lower extremity with the hope for intervention for limb salvage.  The risks and benefits as well as the alternative therapies was discussed in detail with the patient.  All questions were answered.  Patient agrees to proceed with right angiography.  The patient will follow up with me in the office after the procedure.   2. Chronic kidney disease, stage 4 (severe) (HCC) Recommend:  At this time the patient does not have appropriate extremity access for dialysis  Patient should have a left brachial axillary graft  created.  The risks, benefits and alternative therapies were reviewed in detail with the patient.  All questions were  answered.  The patient agrees to proceed with surgery.   The patient will follow up with me in the office after the surgery.  3. Essential hypertension Continue antihypertensive medications as already ordered, these medications have been reviewed and there are no changes at this time.   Current Outpatient Medications on File Prior to Visit  Medication Sig Dispense Refill   allopurinol (ZYLOPRIM) 100 MG tablet Take 100 mg by mouth daily.     amLODipine (NORVASC) 10 MG tablet Take 1 tablet (10 mg total) by mouth daily.     aspirin EC 81 MG tablet Take 81 mg by mouth daily.     BREZTRI AEROSPHERE 160-9-4.8 MCG/ACT AERO Inhale 2 puffs into the lungs in the morning and at bedtime. Start taking after antibiotics are completed. 10.7 g 11   [employer]ulk Chemicals (POLYOX WSR-301) POWD SMARTSIG:17 Gram(s) By Mouth Daily PRN     buPROPion (WELLBUTRIN) 100 MG tablet Take 100 mg by mouth daily.     cefadroxil (DURICEF) 500 MG/5ML suspension Take 500 mg by mouth every 12 (twelve) hours.     cloNIDine (CATAPRES) 0.1 MG tablet Take 1 tablet (0.1 mg total) by mouth 3 (three) times daily.     collagenase (SANTYL) 250 UNIT/GM ointment Apply topically daily.     cyclobenzaprine (FLEXERIL) 10 MG tablet Take 1 tablet (10 mg total)  by mouth at bedtime. (Patient taking differently: Take 10 mg by mouth at bedtime as needed.) 30 tablet 0   dapagliflozin propanediol (FARXIGA) 5 MG TABS tablet Take 5 mg by mouth daily.     ergocalciferol (VITAMIN D2) 1.25 MG (50000 UT) capsule Take 50,000 Units by mouth once a week. On Fridays     escitalopram (LEXAPRO) 20 MG tablet Take 20 mg by mouth daily.     feeding supplement (ENSURE ENLIVE / ENSURE PLUS) LIQD Take 237 mLs by mouth 2 (two) times daily between meals.     ferrous sulfate 325 (65 FE) MG tablet Take 325 mg by mouth 3 (three) times daily with meals.     fluticasone (FLONASE) 50 MCG/ACT nasal spray Place 2 sprays into both nostrils daily. 48 mL 3   furosemide (LASIX) 20 MG  tablet Take 1 tablet (20 mg total) by mouth every other day.     gabapentin (NEURONTIN) 100 MG capsule Take 100 mg by mouth 3 (three) times daily.     hydrALAZINE (APRESOLINE) 50 MG tablet Take 1 tablet (50 mg total) by mouth 3 (three) times daily.     isosorbide mononitrate (IMDUR) 60 MG 24 hr tablet Take 1 tablet (60 mg total) by mouth at bedtime. Hold if SBP <130     linagliptin (TRADJENTA) 5 MG TABS tablet Take 5 mg by mouth daily.     lip balm (BLISTEX) OINT Apply 1 Application topically as needed for lip care.     loperamide (IMODIUM) 2 MG capsule Take 2 mg by mouth 2 (two) times daily as needed for diarrhea or loose stools.     losartan (COZAAR) 100 MG tablet Take 1 tablet (100 mg total) by mouth daily.     melatonin 3 MG TABS tablet Take 2 tablets (6 mg total) by mouth at bedtime as needed.     Multiple Vitamin (MULTIVITAMIN) capsule Take 1 capsule by mouth daily.     nystatin (MYCOSTATIN) 100000 UNIT/ML suspension Take by mouth.     omeprazole (PRILOSEC) 40 MG capsule Take 40 mg by mouth daily.     Paliperidone ER (INVEGA SUSTENNA) injection Inject 78 mg into the muscle every 30 (thirty) days.     polyethylene glycol powder (GLYCOLAX/MIRALAX) 17 GM/SCOOP powder Take 17 g by mouth daily as needed.     TYLENOL 325 MG tablet Take 650 mg by mouth every 8 (eight) hours as needed for mild pain. *DO NOT EXCEED 4GM OF TYLENOL IN 24 HOURS*     No current facility-administered medications on file prior to visit.    There are no Patient Instructions on file for this visit. No follow-ups on file.   Georgiana Spinner, NP

## 2023-06-24 NOTE — Progress Notes (Signed)
Subjective:    Patient ID: Alicia Rogers, female    DOB: 1961/06/05, 62 y.o.   MRN: [id number] Chief Complaint  Patient presents with   Follow-up    UE v-map/arterial/consult. avf creation. singh    The patient is a 62 year old female who presents today for multiple referrals.  The initial referral was for vein mapping due to her being chronic kidney disease stage V.  Additionally she has also developed a wound on her right lower extremity which has been very slow to heal which is noted that she had a referral for ABIs from the wound care center.  The patient is not on dialysis but she does have worsening creatinine and GFR numbers.  She is a somewhat poor historian but she has not having any shortness of breath or lower extremity edema noted today.  She notes that especially at night her right leg and foot hurt.  Today she underwent noninvasive studies which shows ABI og 0.56 on the right with an ABI of 0.82.  Additionally patient had upper extremity vein mapping which shows small diameter vessels with adequate access for creation of a left brachial axillary AV graft.    Review of Systems  Musculoskeletal:  Positive for gait problem.  Skin:  Positive for wound.  All other systems reviewed and are negative.      Objective:   Physical Exam Vitals reviewed.  HENT:     Head: Normocephalic.  Cardiovascular:     Rate and Rhythm: Normal rate.     Pulses:          Radial pulses are 2+ on the right side and 2+ on the left side.       Dorsalis pedis pulses are detected w/ Doppler on the right side and detected w/ Doppler on the left side.       Posterior tibial pulses are detected w/ Doppler on the right side and detected w/ Doppler on the left side.  Pulmonary:     Effort: Pulmonary effort is normal.  Skin:    General: Skin is warm and dry.  Neurological:     Mental Status: She is alert and oriented to person, place, and time.  Psychiatric:        Mood and Affect: Mood normal.         Behavior: Behavior normal.        Thought Content: Thought content normal.        Judgment: Judgment normal.     BP 133/81 (BP Location: Left Arm)   Pulse 63   Resp 18   Ht 5\' 2"  (1.575 m)   Wt 111 lb 6.4 oz (50.5 kg)   BMI 20.38 kg/m   Past Medical History:  Diagnosis Date   Depression    Diabetes mellitus without complication (HCC)    Diabetes mellitus, type II (HCC)    Hand swelling 09/27/2021   Hypertension    Schizophrenia (HCC)    Stage 3b chronic kidney disease (HCC) 12/22/2021    Social History   Socioeconomic History   Marital status: Single    Spouse name: Not on file   Number of children: Not on file   Years of education: Not on file   Highest education level: Not on file  Occupational History   Not on file  Tobacco Use   Smoking status: Every Day    Current packs/day: 1.00    Average packs/day: 1 pack/day for 45.2 years (45.2 ttl pk-yrs)    Types:  Cigarettes    Start date: 04/21/1978   Smokeless tobacco: Never  Vaping Use   Vaping status: Former  Substance and Sexual Activity   Alcohol use: No    Alcohol/week: 0.0 standard drinks of alcohol   Drug use: No   Sexual activity: Not Currently  Other Topics Concern   Not on file  Social History Narrative   Not on file   Social Determinants of Health   Financial Resource Strain: Medium Risk (03/04/2023)   Received from [facility]   Overall Financial Resource Strain (CARDIA)    Difficulty of Paying Living Expenses: Somewhat hard  Food Insecurity: No Food Insecurity (03/04/2023)   Received from [facility]   Hunger Vital Sign    Worried About Running Out of Food in the Last Year: Never true    Ran Out of Food in the Last Year: Never true  Transportation Needs: No Transportation Needs (03/04/2023)   Received from [facility] - Transportation    In the past 12 months, has lack of transportation kept you from medical appointments or from  getting medications?: No    Lack of Transportation (Non-Medical): No  Physical Activity: Not on file  Stress: Not on file  Social Connections: Unknown (09/28/2022)   Received from [facility]   Social Network    Social Network: Not on file  Intimate Partner Violence: Unknown (09/28/2022)   Received from [facility]   HITS    Physically Hurt: Not on file    Insult or Talk Down To: Not on file    Threaten Physical Harm: Not on file    Scream or Curse: Not on file    Past Surgical History:  Procedure Laterality Date   COLONOSCOPY WITH PROPOFOL N/A 07/21/2022   Procedure: COLONOSCOPY WITH PROPOFOL;  Surgeon: Toney Reil, MD;  Location: [facility] ENDOSCOPY;  Service: Gastroenterology;  Laterality: N/A;   colonscopy     ESOPHAGOGASTRODUODENOSCOPY (EGD) WITH PROPOFOL N/A 07/21/2022   Procedure: ESOPHAGOGASTRODUODENOSCOPY (EGD) WITH PROPOFOL;  Surgeon: Toney Reil, MD;  Location: [facility] ENDOSCOPY;  Service: Gastroenterology;  Laterality: N/A;    Family History  Problem Relation Age of Onset   Diabetes Mother    Glaucoma Mother    Heart disease Mother    Heart attack Mother    Depression Mother    Hypertension Mother    Diabetes Father    Glaucoma Father    Hypertension Father    Prostate cancer Father    Diabetes Sister    Depression Sister    Post-traumatic stress disorder Sister     Allergies  Allergen Reactions   Penicillins     Rash   Lisinopril        Latest Ref Rng & Units 05/16/2023    5:45 AM 05/15/2023    6:18 AM 05/14/2023    5:26 AM  CBC  WBC 4.0 - 10.5 K/uL 10.1  10.9  12.8   Hemoglobin 12.0 - 15.0 g/dL 8.6  8.6  8.7   Hematocrit 36.0 - 46.0 % 28.2  28.5  29.4   Platelets 150 - 400 K/uL 486  552  601       CMP     Component Value Date/Time   NA 141 05/16/2023 0545   NA 140 09/03/2022 1025   NA 142 04/23/2014 0128   K 5.2 (H) 05/16/2023 0545   K 3.4 (L) 04/23/2014 0128   CL 107 05/16/2023  0545   CL  105 04/23/2014 0128   CO2 23 05/16/2023 0545   CO2 30 04/23/2014 0128   GLUCOSE 110 (H) 05/16/2023 0545   GLUCOSE 202 (H) 04/23/2014 0128   BUN 39 (H) 05/16/2023 0545   BUN 69 (H) 09/03/2022 1025   BUN 5 (L) 04/23/2014 0128   CREATININE 3.36 (H) 05/16/2023 0545   CREATININE 3.37 (H) 03/07/2023 1440   CREATININE 0.89 04/23/2014 0128   CALCIUM 9.7 05/16/2023 0545   CALCIUM 9.4 04/23/2014 0128   PROT 7.1 05/07/2023 1619   PROT 6.7 09/03/2022 1025   ALBUMIN 2.0 (L) 05/07/2023 1619   ALBUMIN 3.6 (L) 09/03/2022 1025   AST 22 05/07/2023 1619   AST 22 03/07/2023 1440   ALT 22 05/07/2023 1619   ALT 31 03/07/2023 1440   ALKPHOS 105 05/07/2023 1619   BILITOT 0.5 05/07/2023 1619   BILITOT 0.3 03/07/2023 1440   EGFR 13 (L) 09/03/2022 1025   GFRNONAA 15 (L) 05/16/2023 0545   GFRNONAA 15 (L) 03/07/2023 1440   GFRNONAA >60 04/23/2014 0128     No results found.     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  This represents a limb threatening ischemia and places the patient at the risk for right limb loss.  Patient should undergo angiography of the right lower extremity with the hope for intervention for limb salvage.  The risks and benefits as well as the alternative therapies was discussed in detail with the patient.  All questions were answered.  Patient agrees to proceed with right angiography.  The patient will follow up with me in the office after the procedure.   2. Chronic kidney disease, stage 4 (severe) (HCC) Recommend:  At this time the patient does not have appropriate extremity access for dialysis  Patient should have a left brachial axillary graft  created.  The risks, benefits and alternative therapies were reviewed in detail with the patient.  All questions were  answered.  The patient agrees to proceed with surgery.   The patient will follow up with me in the office after the surgery.  3. Essential hypertension Continue antihypertensive medications as already ordered, these medications have been reviewed and there are no changes at this time.   Current Outpatient Medications on File Prior to Visit  Medication Sig Dispense Refill   allopurinol (ZYLOPRIM) 100 MG tablet Take 100 mg by mouth daily.     amLODipine (NORVASC) 10 MG tablet Take 1 tablet (10 mg total) by mouth daily.     aspirin EC 81 MG tablet Take 81 mg by mouth daily.     BREZTRI AEROSPHERE 160-9-4.8 MCG/ACT AERO Inhale 2 puffs into the lungs in the morning and at bedtime. Start taking after antibiotics are completed. 10.7 g 11   [employer] (POLYOX WSR-301) POWD SMARTSIG:17 Gram(s) By Mouth Daily PRN     buPROPion (WELLBUTRIN) 100 MG tablet Take 100 mg by mouth daily.     cefadroxil (DURICEF) 500 MG/5ML suspension Take 500 mg by mouth every 12 (twelve) hours.     cloNIDine (CATAPRES) 0.1 MG tablet Take 1 tablet (0.1 mg total) by mouth 3 (three) times daily.     collagenase (SANTYL) 250 UNIT/GM ointment Apply topically daily.     cyclobenzaprine (FLEXERIL) 10 MG tablet Take 1 tablet (10 mg total)  by mouth at bedtime. (Patient taking differently: Take 10 mg by mouth at bedtime as needed.) 30 tablet 0   dapagliflozin propanediol (FARXIGA) 5 MG TABS tablet Take 5 mg by mouth daily.     ergocalciferol (VITAMIN D2) 1.25 MG (50000 UT) capsule Take 50,000 Units by mouth once a week. On Fridays     escitalopram (LEXAPRO) 20 MG tablet Take 20 mg by mouth daily.     feeding supplement (ENSURE ENLIVE / ENSURE PLUS) LIQD Take 237 mLs by mouth 2 (two) times daily between meals.     ferrous sulfate 325 (65 FE) MG tablet Take 325 mg by mouth 3 (three) times daily with meals.     fluticasone (FLONASE) 50 MCG/ACT nasal spray Place 2 sprays into both nostrils daily. 48 mL 3   furosemide (LASIX) 20 MG  tablet Take 1 tablet (20 mg total) by mouth every other day.     gabapentin (NEURONTIN) 100 MG capsule Take 100 mg by mouth 3 (three) times daily.     hydrALAZINE (APRESOLINE) 50 MG tablet Take 1 tablet (50 mg total) by mouth 3 (three) times daily.     isosorbide mononitrate (IMDUR) 60 MG 24 hr tablet Take 1 tablet (60 mg total) by mouth at bedtime. Hold if SBP <130     linagliptin (TRADJENTA) 5 MG TABS tablet Take 5 mg by mouth daily.     lip balm (BLISTEX) OINT Apply 1 Application topically as needed for lip care.     loperamide (IMODIUM) 2 MG capsule Take 2 mg by mouth 2 (two) times daily as needed for diarrhea or loose stools.     losartan (COZAAR) 100 MG tablet Take 1 tablet (100 mg total) by mouth daily.     melatonin 3 MG TABS tablet Take 2 tablets (6 mg total) by mouth at bedtime as needed.     Multiple Vitamin (MULTIVITAMIN) capsule Take 1 capsule by mouth daily.     nystatin (MYCOSTATIN) 100000 UNIT/ML suspension Take by mouth.     omeprazole (PRILOSEC) 40 MG capsule Take 40 mg by mouth daily.     Paliperidone ER (INVEGA SUSTENNA) injection Inject 78 mg into the muscle every 30 (thirty) days.     polyethylene glycol powder (GLYCOLAX/MIRALAX) 17 GM/SCOOP powder Take 17 g by mouth daily as needed.     TYLENOL 325 MG tablet Take 650 mg by mouth every 8 (eight) hours as needed for mild pain. *DO NOT EXCEED 4GM OF TYLENOL IN 24 HOURS*     No current facility-administered medications on file prior to visit.    There are no Patient Instructions on file for this visit. No follow-ups on file.   Georgiana Spinner, NP

## 2023-06-24 NOTE — Telephone Encounter (Signed)
Spoke with Alicia Rogers at [facility] and the patient is scheduled with Dr. Wyn Quaker for a RLE angio on 07/07/23 with a 11:00 am arrival time to the [facility]. Pre-procedure instructions were discussed and will be faxed to attention Alicia Rogers. Patient is also scheduled for a left a AV graft on 07/14/23 with Dr. Wyn Quaker at the [facility] Pre-op is on 07/06/23 at 9:00 am at the [facility]. Pre-surgical instructions will be faxed to [facility] at [facility].

## 2023-07-04 IMAGING — MR MR CERVICAL SPINE W/O CM
5 series · 38 of 48 positions shown · non-contrast
Comparison: None available.

CLINICAL DATA: Initial evaluation for spinal stenosis.

EXAM:
MRI CERVICAL SPINE WITHOUT CONTRAST
TECHNIQUE: Multiplanar, multisequence MR imaging of the cervical spine was
performed. No intravenous contrast was administered.

[Series 13: T2 · sagittal · 3.0mm · 0.62mm/px · 7 of 15 slices shown (1 of 2)]
[im 1/15]
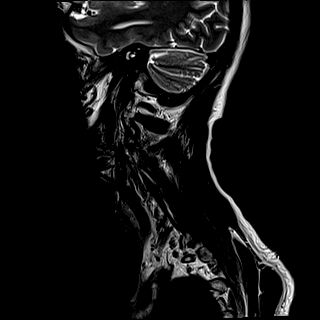
[im 3/15]
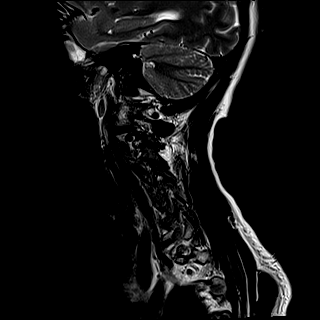
[im 5/15]
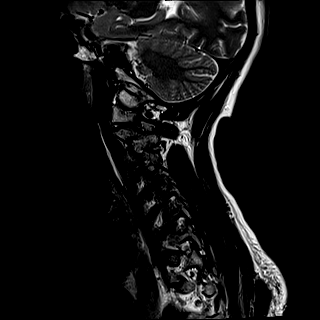
[im 8/15]
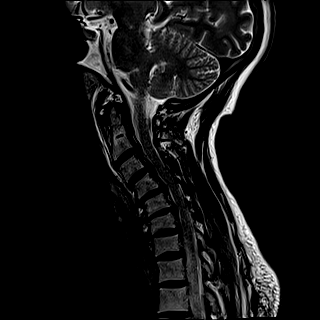
[im 10/15]
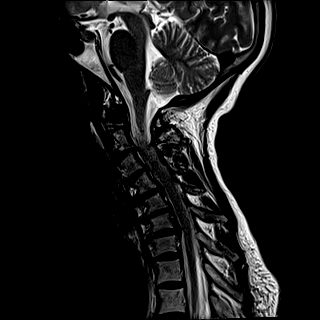
[im 12/15]
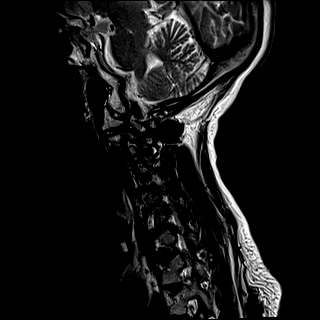
[im 15/15]
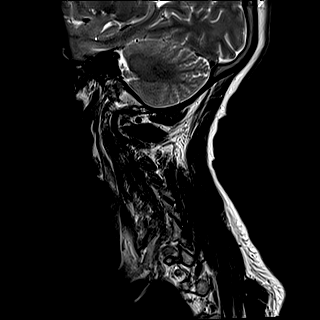

[Series 14: FLAIR · sagittal · 3.0mm · 0.78mm/px · 7 of 15 slices shown]
[im 1/15]
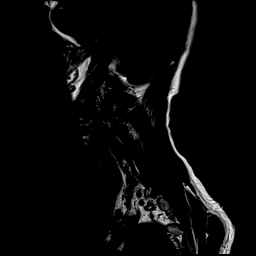
[im 3/15]
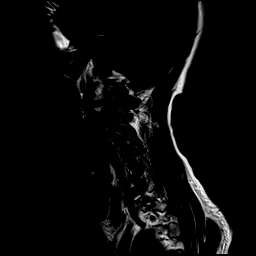
[im 5/15]
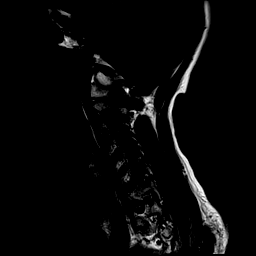
[im 8/15]
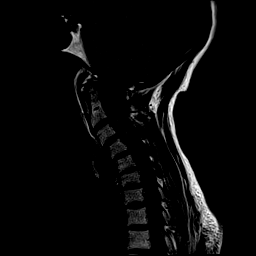
[im 10/15]
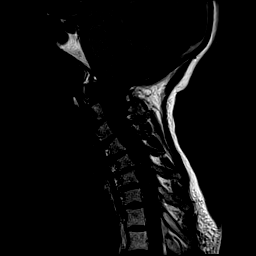
[im 12/15]
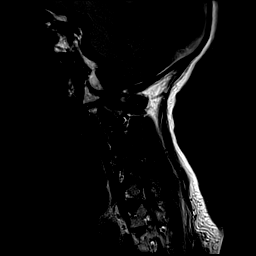
[im 15/15]
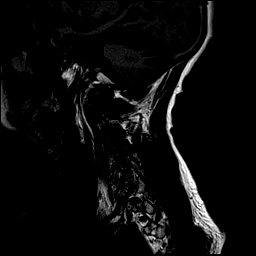

[Series 15: STIR · sagittal · 3.0mm · 0.62mm/px · 8 of 15 slices shown]
[im 1/15]
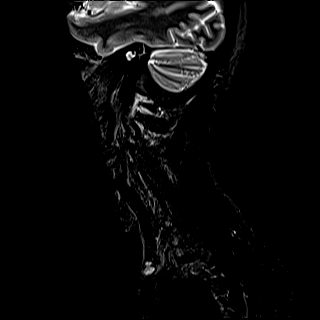
[im 3/15]
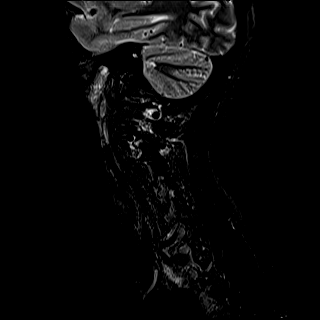
[im 5/15]
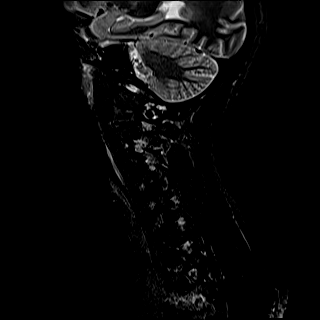
[im 7/15]
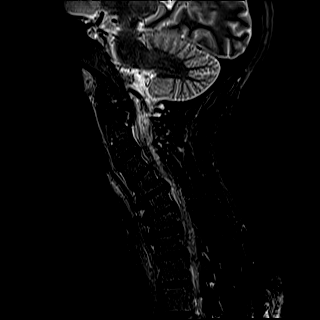
[im 9/15]
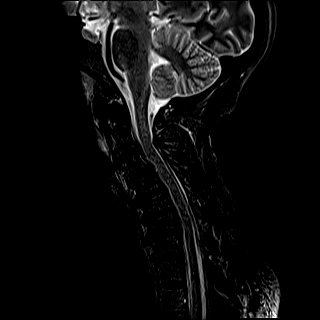
[im 11/15]
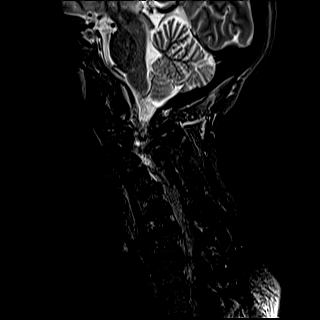
[im 13/15]
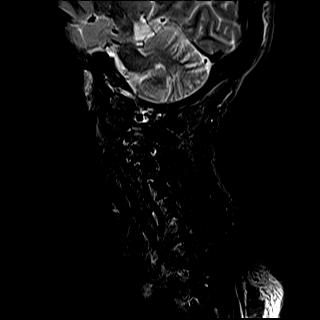
[im 15/15]
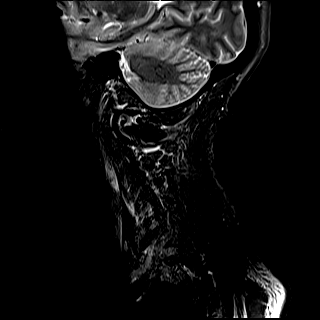

[Series 16: T2 · oblique · 3.0mm · 0.70mm/px · 9 of 25 slices shown (2 of 2)]
[im 1/25]
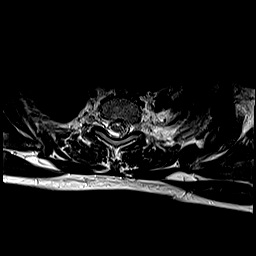
[im 5/25]
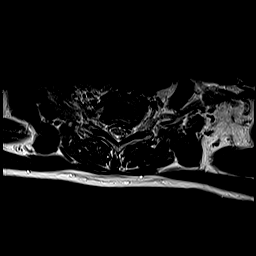
[im 9/25]
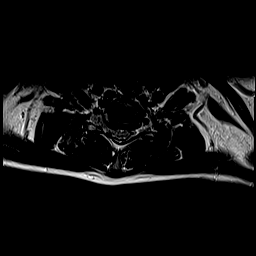
[im 11/25]
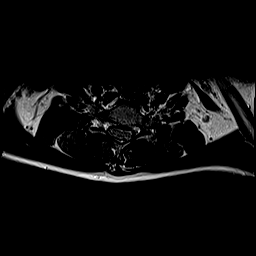
[im 13/25]
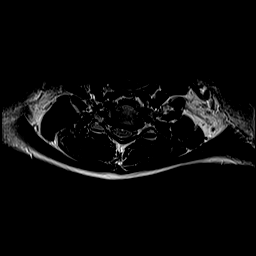
[im 15/25]
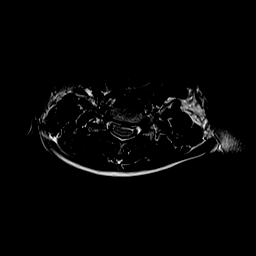
[im 17/25]
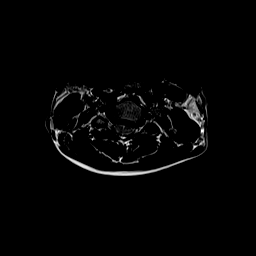
[im 21/25]
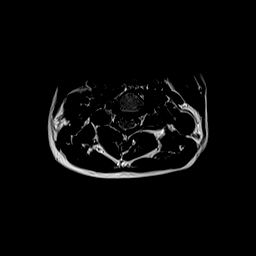
[im 25/25]
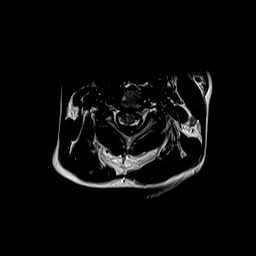

[Series 17: ax mpgr · oblique · 3.0mm · 0.35mm/px · 7 of 25 slices shown]
[im 1/25]
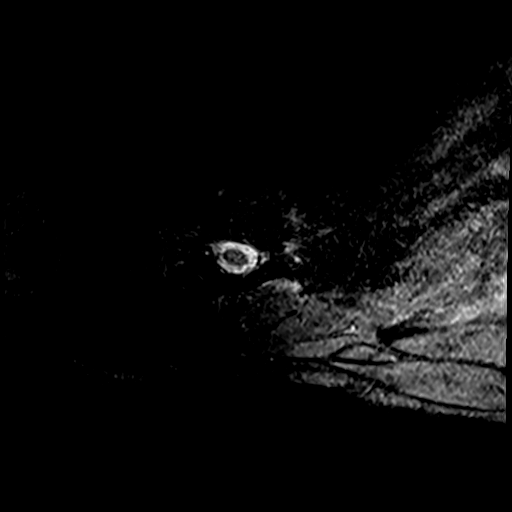
[im 5/25]
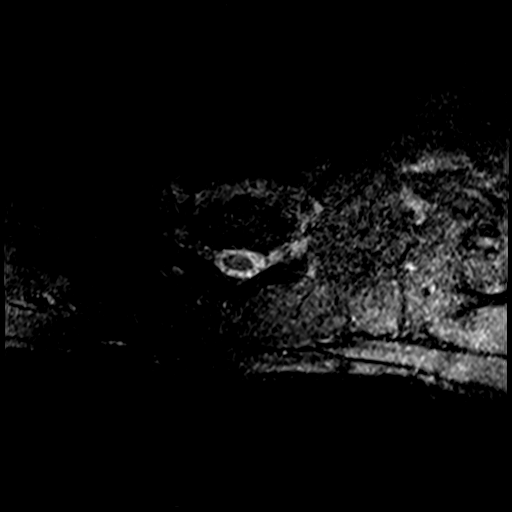
[im 9/25]
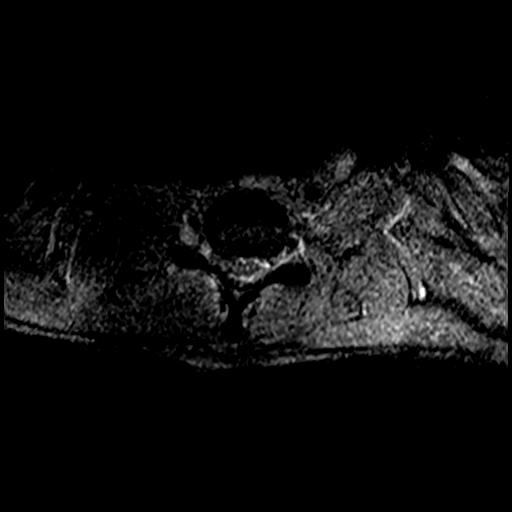
[im 11/25]
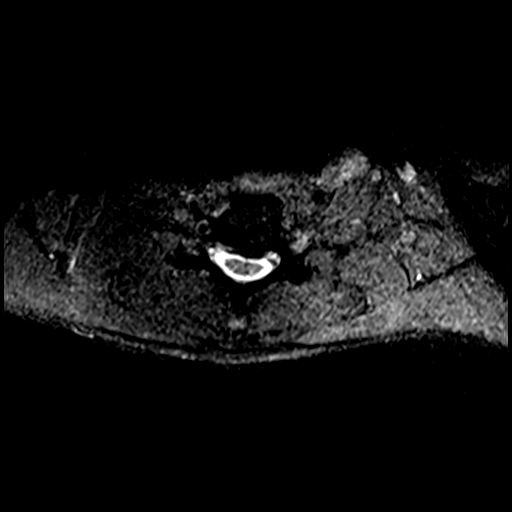
[im 15/25]
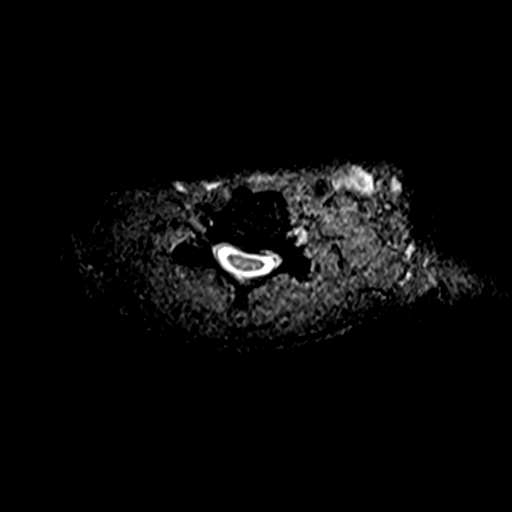
[im 17/25]
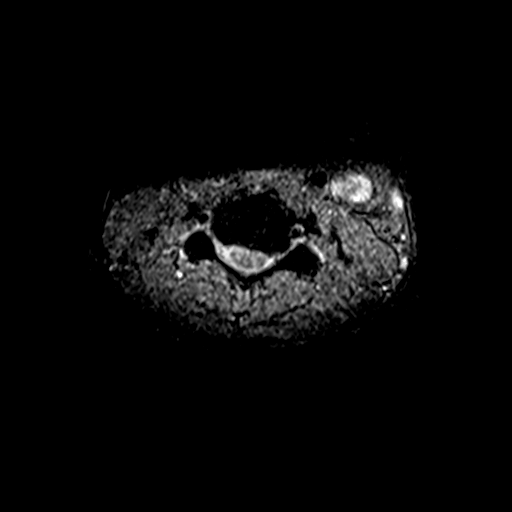
[im 21/25]
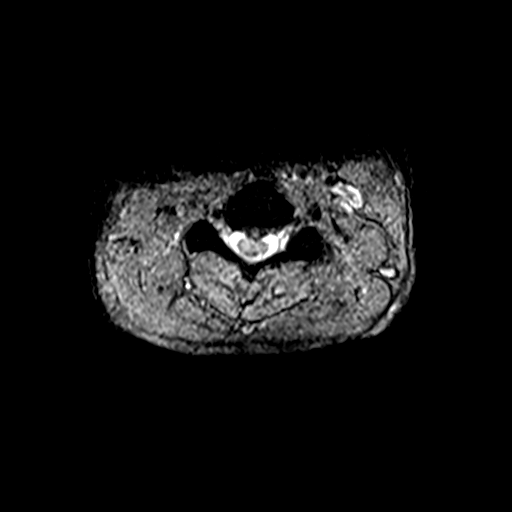

[38 of 48 positions shown; findings below may reference images not displayed]

FINDINGS: Alignment: Reversal of the normal cervical lordosis with apex at
C5-6. No listhesis.

Vertebrae: Vertebral body height maintained without acute or chronic
fracture. Bone marrow signal intensity within normal limits. No
discrete or worrisome osseous lesions. No abnormal marrow edema.

Cord: Normal signal and morphology.

Posterior Fossa, vertebral arteries, paraspinal tissues:
Craniocervical junction within normal limits. Paraspinous and
prevertebral soft tissues are normal. Normal flow voids seen within
the vertebral arteries bilaterally.

Disc levels:

C2-C3: Central disc osteophyte complex indents the ventral thecal
sac. Resultant mild spinal stenosis without cord impingement.
Foramina remain patent.

C3-C4: Central disc protrusion indents the ventral thecal sac,
contacting and mildly flattening the ventral cord (series 17, image
5). Mild to moderate spinal stenosis. Superimposed mild
uncovertebral spurring without significant foraminal encroachment.

C4-C5: Mild degenerative intervertebral disc space narrowing.
Broad-based posterior disc osteophyte flattens and partially faces
the ventral thecal sac, asymmetric to the left. Mild cord flattening
without cord signal changes. Mild to moderate spinal stenosis.
Bilateral uncovertebral spurring with resultant moderate bilateral
C5 foraminal stenosis.

C5-C6: Moderate degenerative intervertebral disc space narrowing
with diffuse disc osteophyte complex. Broad posterior component
flattens and partially effaces the ventral thecal sac, asymmetric to
the left. Mild cord flattening without cord signal changes. Mild to
moderate spinal stenosis. Moderate bilateral C6 foraminal narrowing.

C6-C7: Mild degenerative intervertebral disc space narrowing.
Diffuse disc osteophyte complex, asymmetric to the right. Mild
flattening and effacement of the ventral thecal sac with resultant
mild spinal stenosis. Superimposed uncovertebral spurring without
significant foraminal encroachment.

C7-T1: Mild disc bulge. Right-sided uncovertebral spurring. Mild
facet and ligament flavum hypertrophy. No spinal stenosis. Foramina
remain patent.

Visualized upper thoracic spine demonstrates no significant finding.
IMPRESSION: 1. Multilevel cervical spondylosis with resultant mild to moderate
diffuse spinal stenosis at C3-4 through C6-7. No frank cord
impingement or cord signal changes.
2. Moderate bilateral C5 and C6 foraminal stenosis related to disc
bulging and uncovertebral disease.
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
## 2023-07-05 ENCOUNTER — Other Ambulatory Visit (INDEPENDENT_AMBULATORY_CARE_PROVIDER_SITE_OTHER): Payer: Self-pay | Admitting: Nurse Practitioner

## 2023-07-05 DIAGNOSIS — N185 Chronic kidney disease, stage 5: Secondary | ICD-10-CM

## 2023-07-06 ENCOUNTER — Encounter: Payer: Self-pay | Admitting: Urgent Care

## 2023-07-06 ENCOUNTER — Other Ambulatory Visit: Payer: Self-pay

## 2023-07-06 ENCOUNTER — Encounter
Admission: RE | Admit: 2023-07-06 | Discharge: 2023-07-06 | Disposition: A | Discharge status: Home / Self Care | Payer: 59 | Source: Ambulatory Visit | Attending: Vascular Surgery | Admitting: Vascular Surgery

## 2023-07-06 VITALS — BP 142/62 | HR 100 | Resp 18 | Ht 62.0 in | Wt 113.2 lb

## 2023-07-06 DIAGNOSIS — N185 Chronic kidney disease, stage 5: Secondary | ICD-10-CM | POA: Insufficient documentation

## 2023-07-06 DIAGNOSIS — R300 Dysuria: Secondary | ICD-10-CM

## 2023-07-06 DIAGNOSIS — E1122 Type 2 diabetes mellitus with diabetic chronic kidney disease: Secondary | ICD-10-CM | POA: Diagnosis not present

## 2023-07-06 DIAGNOSIS — Z01812 Encounter for preprocedural laboratory examination: Secondary | ICD-10-CM | POA: Diagnosis present

## 2023-07-06 DIAGNOSIS — I160 Hypertensive urgency: Secondary | ICD-10-CM

## 2023-07-06 DIAGNOSIS — N184 Chronic kidney disease, stage 4 (severe): Secondary | ICD-10-CM

## 2023-07-06 DIAGNOSIS — E875 Hyperkalemia: Secondary | ICD-10-CM

## 2023-07-06 HISTORY — DX: Diverticulosis of intestine, part unspecified, without perforation or abscess without bleeding: K57.90

## 2023-07-06 HISTORY — DX: Atherosclerosis of aorta: I70.0

## 2023-07-06 HISTORY — DX: Paranoid schizophrenia: F20.0

## 2023-07-06 HISTORY — DX: Benign neoplasm of left adrenal gland: D35.02

## 2023-07-06 HISTORY — DX: Proteinuria, unspecified: R80.9

## 2023-07-06 HISTORY — DX: Chronic kidney disease, stage 5: N18.5

## 2023-07-06 HISTORY — DX: Anemia, unspecified: D64.9

## 2023-07-06 HISTORY — DX: Benign neoplasm of right adrenal gland: D35.01

## 2023-07-06 LAB — CBC WITH DIFFERENTIAL/PLATELET
Abs Immature Granulocytes: 0.15 10*3/uL — ABNORMAL HIGH (ref 0.00–0.07)
Basophils Absolute: 0 10*3/uL (ref 0.0–0.1)
Basophils Relative: 0 %
Eosinophils Absolute: 0.2 10*3/uL (ref 0.0–0.5)
Eosinophils Relative: 2 %
HCT: 29 % — ABNORMAL LOW (ref 36.0–46.0)
Hemoglobin: 8.6 g/dL — ABNORMAL LOW (ref 12.0–15.0)
Immature Granulocytes: 2 %
Lymphocytes Relative: 18 %
Lymphs Abs: 1.7 10*3/uL (ref 0.7–4.0)
MCH: 28.8 pg (ref 26.0–34.0)
MCHC: 29.7 g/dL — ABNORMAL LOW (ref 30.0–36.0)
MCV: 97 fL (ref 80.0–100.0)
Monocytes Absolute: 0.9 10*3/uL (ref 0.1–1.0)
Monocytes Relative: 9 %
Neutro Abs: 6.6 10*3/uL (ref 1.7–7.7)
Neutrophils Relative %: 69 %
Platelets: 430 10*3/uL — ABNORMAL HIGH (ref 150–400)
RBC: 2.99 MIL/uL — ABNORMAL LOW (ref 3.87–5.11)
RDW: 15.8 % — ABNORMAL HIGH (ref 11.5–15.5)
WBC: 9.5 10*3/uL (ref 4.0–10.5)
nRBC: 0.4 % — ABNORMAL HIGH (ref 0.0–0.2)

## 2023-07-06 LAB — BASIC METABOLIC PANEL
Anion gap: 8 (ref 5–15)
BUN: 61 mg/dL — ABNORMAL HIGH (ref 8–23)
CO2: 18 mmol/L — ABNORMAL LOW (ref 22–32)
Calcium: 9.4 mg/dL (ref 8.9–10.3)
Chloride: 113 mmol/L — ABNORMAL HIGH (ref 98–111)
Creatinine, Ser: 2.68 mg/dL — ABNORMAL HIGH (ref 0.44–1.00)
GFR, Estimated: 19 mL/min — ABNORMAL LOW (ref >60)
Glucose, Bld: 85 mg/dL (ref 70–99)
Potassium: 5.9 mmol/L — ABNORMAL HIGH (ref 3.5–5.1)
Sodium: 139 mmol/L (ref 135–145)

## 2023-07-06 LAB — TYPE AND SCREEN
ABO/RH(D): A POS
Antibody Screen: NEGATIVE

## 2023-07-06 NOTE — Progress Notes (Signed)
  [facility] Perioperative Services: Pre-Admission/Anesthesia Testing  Abnormal Lab Notification   Date: 07/06/23  Name: Alicia Rogers MRN:   [id number]  Re: Abnormal labs noted during PAT appointment   Notified:    Provider Name Provider Role Notification Mode  Dew, Marlow Baars, MD Vascular Surgery (Surgeon) Routed and/or faxed via Tollie Pizza, NP-C Vascular Surgery (APP) Routed and/or faxed via [facility]   ABNORMAL LAB VALUE(S):   Lab Results  Component Value Date   K 5.9 (H) 07/06/2023   Clinical Information and Notes:  Alicia Rogers is scheduled for a Lower Extremity Angiography (Right: Leg Lower) tomorrow (07/07/2023). She will then undergo a LEFT INSERTION OF ARTERIOVENOUS (AV) GORE-TEX GRAFT ARM on 07/14/2023.   Communicating result to primary surgical team for follow up. I have entered orders to have K+ rechecked on the day of her procedures (both 07/07/2023 and 07/14/2023) to ensure that we are at a safe level to proceed with her case under sedation and/or anesthesia.   Quentin Mulling, MSN, APRN, FNP-C, [facility]  Perioperative Services Nurse Practitioner Phone: [phone] Fax: [phone] 07/06/23 1:07 PM

## 2023-07-06 NOTE — Patient Instructions (Addendum)
Your procedure is scheduled on: Thursday July 14, 2023. Report to the Registration Desk on the 1st floor of the [facility]. To find out your arrival time, please call [phone] between 1PM - 3PM on: Wednesday 07/13/2023. If your arrival time is 6:00 am, do not arrive before that time as the [facility] entrance doors do not open until 6:00 am.  REMEMBER: Instructions that are not followed completely may result in serious medical risk, up to and including death; or upon the discretion of your surgeon and anesthesiologist your surgery may need to be rescheduled.  Do not eat food or drink fluids after midnight the night before surgery.  No gum chewing or hard candies.  One week prior to surgery: Stop Anti-inflammatories (NSAIDS) such as Advil, Aleve, Ibuprofen, Motrin, Naproxen, Naprosyn and Aspirin based products such as Excedrin, Goody's Powder, BC Powder. Stop ANY OVER THE COUNTER supplements until after surgery.  You may however, continue to take Tylenol if needed for pain up until the day of surgery.  Stop dapagliflozin propanediol (FARXIGA) 5 MG 3 days prior to surgery (take last dose 07/10/23)  Continue taking all of your other prescription medications up until the day of surgery.  ON THE DAY OF SURGERY ONLY TAKE THESE MEDICATIONS WITH SIPS OF WATER:  allopurinol (ZYLOPRIM) 100 MG  amLODipine (NORVASC) 10 MG  buPROPion (WELLBUTRIN) 100 MG  cloNIDine (CATAPRES) 0.1 MG  escitalopram (LEXAPRO) 20 MG  gabapentin (NEURONTIN) 100 MG hydrALAZINE (APRESOLINE) 50 MG   isosorbide mononitrate (IMDUR) 60 MG  omeprazole (PRILOSEC) 40 MG   Use inhalers on the day of surgery and bring to the hospital. BREZTRI AEROSPHERE 160-9-4.8 MCG/ACT AERO   No Alcohol for 24 hours before or after surgery.  No Smoking including e-cigarettes for 24 hours before surgery.  No chewable tobacco products for at least 6 hours before surgery.  No nicotine patches on the day of surgery.  Do not  use any "recreational" drugs for at least a week (preferably 2 weeks) before your surgery.  Please be advised that the combination of cocaine and anesthesia may have negative outcomes, up to and including death. If you test positive for cocaine, your surgery will be cancelled.  On the morning of surgery brush your teeth with toothpaste and water, you may rinse your mouth with mouthwash if you wish. Do not swallow any toothpaste or mouthwash.  Use CHG Soap or wipes as directed on instruction sheet.  Do not wear jewelry, make-up, hairpins, clips or nail polish.  For welded (permanent) jewelry: bracelets, anklets, waist bands, etc.  Please have this removed prior to surgery.  If it is not removed, there is a chance that hospital personnel will need to cut it off on the day of surgery.  Do not wear lotions, powders, or perfumes.   Do not shave body hair from the neck down 48 hours before surgery.  Contact lenses, hearing aids and dentures may not be worn into surgery.  Do not bring valuables to the hospital. [facility] is not responsible for any missing/lost belongings or valuables.   Total Shoulder Arthroplasty:  use Benzoyl Peroxide 5% Gel as directed on instruction sheet.  Bring your C-PAP to the hospital in case you may have to spend the night.   Notify your doctor if there is any change in your medical condition (cold, fever, infection).  Wear comfortable clothing (specific to your surgery type) to the hospital.  After surgery, you can help prevent lung complications by doing breathing exercises.  Take deep breaths and cough every 1-2 hours. Your doctor may order a device called an Incentive Spirometer to help you take deep breaths. When coughing or sneezing, hold a pillow firmly against your incision with both hands. This is called "splinting." Doing this helps protect your incision. It also decreases belly discomfort.  If you are being admitted to the hospital overnight, leave  your suitcase in the car. After surgery it may be brought to your room.  In case of increased patient census, it may be necessary for you, the patient, to continue your postoperative care in the Same Day Surgery department.  If you are being discharged the day of surgery, you will not be allowed to drive home. You will need a responsible individual to drive you home and stay with you for 24 hours after surgery.   If you are taking public transportation, you will need to have a responsible individual with you.  Please call the Pre-admissions Testing Dept. at [phone] if you have any questions about these instructions.  Surgery Visitation Policy:  Patients having surgery or a procedure may have two visitors.  Children under the age of 73 must have an adult with them who is not the patient.  Inpatient Visitation:    Visiting hours are 7 a.m. to 8 p.m. Up to four visitors are allowed at one time in a patient room. The visitors may rotate out with other people during the day.  One visitor age 71 or older may stay with the patient overnight and must be in the room by 8 p.m.  Preparing the Skin Before Surgery     To help prevent the risk of infection at your surgical site, we are now providing you with rinse-free Sage 2% Chlorhexidine Gluconate (CHG) disposable wipes.  Chlorhexidine Gluconate (CHG) Soap  o An antiseptic cleaner that kills germs and bonds with the skin to continue killing germs even after washing  o Used for showering the night before surgery and morning of surgery  The night before surgery: Shower or bathe with warm water. Do not apply perfume, lotions, powders. Wait one hour after shower. Skin should be dry and cool. Open Sage wipe package - use 6 disposable cloths. Wipe body using one cloth for the right arm, one cloth for the left arm, one cloth for the right leg, one cloth for the left leg, one cloth for the chest/abdomen area, and one cloth for the  back. Do not use on open wounds or sores. Do not use on face or genitals (private parts). If you are breast feeding, do not use on breasts. 5. Do not rinse, allow to dry. 6. Skin may feel "tacky" for several minutes. 7. Dress in clean clothes. 8. Place clean sheets on your bed and do not sleep with pets.  REPEAT ABOVE ON THE MORNING OF SURGERY BEFORE ARRIVING TO THE HOSPITAL.

## 2023-07-06 NOTE — Pre-Procedure Instructions (Signed)
TC to [facility] and spoke with Morrie Sheldon nurse that attends Ms. Luce. To inform of PAT instructions, informed day of surgery NPO after MN, phone number that they need to call the day before, list of medications she needs to stop and take the morning of surgery  surgery and CHG wipes. Morrie Sheldon stated that as long as Ms. Mates has a copy of instructions  to bring to [facility] she is fine.

## 2023-07-07 ENCOUNTER — Encounter
Admission: RE | Disposition: A | Discharge status: Skilled Nursing Facility | Payer: Self-pay | Source: Ambulatory Visit | Attending: Obstetrics and Gynecology

## 2023-07-07 ENCOUNTER — Encounter: Payer: Self-pay | Admitting: Vascular Surgery

## 2023-07-07 ENCOUNTER — Observation Stay
Admission: RE | Admit: 2023-07-07 | Discharge: 2023-07-10 | Disposition: A | Discharge status: Skilled Nursing Facility | Payer: 59 | Source: Ambulatory Visit | Attending: Obstetrics and Gynecology | Admitting: Obstetrics and Gynecology

## 2023-07-07 ENCOUNTER — Other Ambulatory Visit: Payer: Self-pay

## 2023-07-07 DIAGNOSIS — J432 Centrilobular emphysema: Secondary | ICD-10-CM

## 2023-07-07 DIAGNOSIS — I10 Essential (primary) hypertension: Secondary | ICD-10-CM | POA: Diagnosis present

## 2023-07-07 DIAGNOSIS — I160 Hypertensive urgency: Secondary | ICD-10-CM | POA: Diagnosis not present

## 2023-07-07 DIAGNOSIS — L97919 Non-pressure chronic ulcer of unspecified part of right lower leg with unspecified severity: Secondary | ICD-10-CM | POA: Insufficient documentation

## 2023-07-07 DIAGNOSIS — J449 Chronic obstructive pulmonary disease, unspecified: Secondary | ICD-10-CM | POA: Diagnosis not present

## 2023-07-07 DIAGNOSIS — Z7982 Long term (current) use of aspirin: Secondary | ICD-10-CM | POA: Diagnosis not present

## 2023-07-07 DIAGNOSIS — I70299 Other atherosclerosis of native arteries of extremities, unspecified extremity: Secondary | ICD-10-CM

## 2023-07-07 DIAGNOSIS — N179 Acute kidney failure, unspecified: Principal | ICD-10-CM

## 2023-07-07 DIAGNOSIS — Z79899 Other long term (current) drug therapy: Secondary | ICD-10-CM | POA: Diagnosis not present

## 2023-07-07 DIAGNOSIS — E1122 Type 2 diabetes mellitus with diabetic chronic kidney disease: Secondary | ICD-10-CM | POA: Diagnosis not present

## 2023-07-07 DIAGNOSIS — E875 Hyperkalemia: Secondary | ICD-10-CM | POA: Diagnosis not present

## 2023-07-07 DIAGNOSIS — F17210 Nicotine dependence, cigarettes, uncomplicated: Secondary | ICD-10-CM | POA: Diagnosis not present

## 2023-07-07 DIAGNOSIS — I120 Hypertensive chronic kidney disease with stage 5 chronic kidney disease or end stage renal disease: Secondary | ICD-10-CM | POA: Diagnosis not present

## 2023-07-07 DIAGNOSIS — N185 Chronic kidney disease, stage 5: Secondary | ICD-10-CM | POA: Diagnosis present

## 2023-07-07 DIAGNOSIS — L97909 Non-pressure chronic ulcer of unspecified part of unspecified lower leg with unspecified severity: Secondary | ICD-10-CM

## 2023-07-07 DIAGNOSIS — I70239 Atherosclerosis of native arteries of right leg with ulceration of unspecified site: Secondary | ICD-10-CM | POA: Diagnosis present

## 2023-07-07 DIAGNOSIS — F251 Schizoaffective disorder, depressive type: Secondary | ICD-10-CM | POA: Diagnosis present

## 2023-07-07 LAB — BASIC METABOLIC PANEL
Anion gap: 7 (ref 5–15)
BUN: 54 mg/dL — ABNORMAL HIGH (ref 8–23)
CO2: 19 mmol/L — ABNORMAL LOW (ref 22–32)
Calcium: 9.6 mg/dL (ref 8.9–10.3)
Chloride: 113 mmol/L — ABNORMAL HIGH (ref 98–111)
Creatinine, Ser: 2.81 mg/dL — ABNORMAL HIGH (ref 0.44–1.00)
GFR, Estimated: 18 mL/min — ABNORMAL LOW (ref >60)
Glucose, Bld: 82 mg/dL (ref 70–99)
Potassium: 5.8 mmol/L — ABNORMAL HIGH (ref 3.5–5.1)
Sodium: 139 mmol/L (ref 135–145)

## 2023-07-07 LAB — POTASSIUM: Potassium: 6.2 mmol/L — ABNORMAL HIGH (ref 3.5–5.1)

## 2023-07-07 LAB — GLUCOSE, CAPILLARY: Glucose-Capillary: 105 mg/dL — ABNORMAL HIGH (ref 70–99)

## 2023-07-07 SURGERY — LOWER EXTREMITY ANGIOGRAPHY
Anesthesia: Moderate Sedation | Site: Leg Lower | Laterality: Right

## 2023-07-07 MED ORDER — BUPROPION HCL 100 MG PO TABS
100.0000 mg | ORAL_TABLET | Freq: Every day | ORAL | Status: DC
  Administered 2023-07-08 – 2023-07-10 (×3): 100 mg via ORAL
  Filled 2023-07-07 (×3): qty 1

## 2023-07-07 MED ORDER — CARBAMIDE PEROXIDE 6.5 % OT SOLN
5.0000 [drp] | Freq: Two times a day (BID) | OTIC | Status: DC
  Administered 2023-07-07 – 2023-07-09 (×3): 5 [drp] via OTIC
  Filled 2023-07-07: qty 15

## 2023-07-07 MED ORDER — UMECLIDINIUM-VILANTEROL 62.5-25 MCG/ACT IN AEPB
1.0000 | INHALATION_SPRAY | Freq: Every day | RESPIRATORY_TRACT | Status: DC
  Administered 2023-07-09: 1 via RESPIRATORY_TRACT
  Filled 2023-07-07: qty 14

## 2023-07-07 MED ORDER — MIDAZOLAM HCL 2 MG/ML PO SYRP: 8.0000 mg | ORAL_SOLUTION | Freq: Once | ORAL | Status: DC | PRN

## 2023-07-07 MED ORDER — AMLODIPINE BESYLATE 10 MG PO TABS
10.0000 mg | ORAL_TABLET | Freq: Every day | ORAL | Status: DC
  Administered 2023-07-08 – 2023-07-10 (×3): 10 mg via ORAL
  Filled 2023-07-07 (×3): qty 1

## 2023-07-07 MED ORDER — GABAPENTIN 100 MG PO CAPS
100.0000 mg | ORAL_CAPSULE | Freq: Three times a day (TID) | ORAL | Status: DC
  Administered 2023-07-07 – 2023-07-10 (×7): 100 mg via ORAL
  Filled 2023-07-07 (×7): qty 1

## 2023-07-07 MED ORDER — BUDESONIDE 0.25 MG/2ML IN SUSP
0.2500 mg | Freq: Two times a day (BID) | RESPIRATORY_TRACT | Status: DC
  Administered 2023-07-08 – 2023-07-10 (×5): 0.25 mg via RESPIRATORY_TRACT
  Filled 2023-07-07 (×5): qty 2

## 2023-07-07 MED ORDER — CYCLOBENZAPRINE HCL 10 MG PO TABS: 10.0000 mg | ORAL_TABLET | Freq: Every evening | ORAL | Status: DC | PRN

## 2023-07-07 MED ORDER — BUDESON-GLYCOPYRROL-FORMOTEROL 160-9-4.8 MCG/ACT IN AERO
2.0000 | INHALATION_SPRAY | Freq: Every day | RESPIRATORY_TRACT | Status: DC
Start: 2023-07-07 — End: 2023-07-07

## 2023-07-07 MED ORDER — DIPHENHYDRAMINE HCL 50 MG/ML IJ SOLN: 50.0000 mg | Freq: Once | INTRAMUSCULAR | Status: DC | PRN

## 2023-07-07 MED ORDER — METHYLPREDNISOLONE SODIUM SUCC 125 MG IJ SOLR: 125.0000 mg | Freq: Once | INTRAMUSCULAR | Status: DC | PRN

## 2023-07-07 MED ORDER — CEFAZOLIN SODIUM-DEXTROSE 1-4 GM/50ML-% IV SOLN: 1.0000 g | INTRAVENOUS | Status: DC

## 2023-07-07 MED ORDER — ISOSORBIDE MONONITRATE ER 60 MG PO TB24
60.0000 mg | ORAL_TABLET | Freq: Every day | ORAL | Status: DC
  Administered 2023-07-07 – 2023-07-09 (×3): 60 mg via ORAL
  Filled 2023-07-07 (×3): qty 1

## 2023-07-07 MED ORDER — HEPARIN SODIUM (PORCINE) 5000 UNIT/ML IJ SOLN
5000.0000 [IU] | Freq: Three times a day (TID) | INTRAMUSCULAR | Status: DC
  Administered 2023-07-07 – 2023-07-08 (×2): 5000 [IU] via SUBCUTANEOUS
  Filled 2023-07-07 (×5): qty 1

## 2023-07-07 MED ORDER — ACETAMINOPHEN 650 MG RE SUPP: 650.0000 mg | Freq: Four times a day (QID) | RECTAL | Status: DC | PRN

## 2023-07-07 MED ORDER — DAPAGLIFLOZIN PROPANEDIOL 5 MG PO TABS
5.0000 mg | ORAL_TABLET | Freq: Every day | ORAL | Status: DC
  Filled 2023-07-07: qty 1

## 2023-07-07 MED ORDER — SODIUM CHLORIDE 0.9% FLUSH
3.0000 mL | Freq: Two times a day (BID) | INTRAVENOUS | Status: DC
  Administered 2023-07-07 – 2023-07-10 (×5): 3 mL via INTRAVENOUS

## 2023-07-07 MED ORDER — CLONIDINE HCL 0.1 MG PO TABS
0.1000 mg | ORAL_TABLET | Freq: Three times a day (TID) | ORAL | Status: DC
  Administered 2023-07-07 – 2023-07-10 (×7): 0.1 mg via ORAL
  Filled 2023-07-07 (×7): qty 1

## 2023-07-07 MED ORDER — HYDRALAZINE HCL 50 MG PO TABS
50.0000 mg | ORAL_TABLET | Freq: Three times a day (TID) | ORAL | Status: DC
  Administered 2023-07-07 – 2023-07-10 (×7): 50 mg via ORAL
  Filled 2023-07-07 (×7): qty 1

## 2023-07-07 MED ORDER — INSULIN ASPART 100 UNIT/ML IJ SOLN
0.0000 [IU] | Freq: Three times a day (TID) | INTRAMUSCULAR | Status: DC
  Administered 2023-07-08: 2 [IU] via SUBCUTANEOUS
  Administered 2023-07-09 – 2023-07-10 (×2): 1 [IU] via SUBCUTANEOUS
  Filled 2023-07-07: qty 1

## 2023-07-07 MED ORDER — FUROSEMIDE 20 MG PO TABS
20.0000 mg | ORAL_TABLET | ORAL | Status: DC
  Administered 2023-07-09: 20 mg via ORAL
  Filled 2023-07-07 (×2): qty 1

## 2023-07-07 MED ORDER — CEFAZOLIN SODIUM-DEXTROSE 1-4 GM/50ML-% IV SOLN
INTRAVENOUS | Status: AC
Start: 2023-07-07 — End: ?
  Filled 2023-07-07: qty 50

## 2023-07-07 MED ORDER — LOSARTAN POTASSIUM 50 MG PO TABS
100.0000 mg | ORAL_TABLET | Freq: Every day | ORAL | Status: DC
  Administered 2023-07-08 – 2023-07-09 (×2): 100 mg via ORAL
  Filled 2023-07-07 (×2): qty 2

## 2023-07-07 MED ORDER — PANTOPRAZOLE SODIUM 40 MG PO TBEC
40.0000 mg | DELAYED_RELEASE_TABLET | Freq: Every day | ORAL | Status: DC
  Administered 2023-07-08 – 2023-07-10 (×3): 40 mg via ORAL
  Filled 2023-07-07 (×3): qty 1

## 2023-07-07 MED ORDER — SODIUM ZIRCONIUM CYCLOSILICATE 10 G PO PACK
10.0000 g | PACK | Freq: Once | ORAL | Status: AC
  Administered 2023-07-07: 10 g via ORAL
  Filled 2023-07-07: qty 1

## 2023-07-07 MED ORDER — HYDROMORPHONE HCL 1 MG/ML IJ SOLN
1.0000 mg | Freq: Once | INTRAMUSCULAR | Status: DC | PRN
Start: 2023-07-07 — End: 2023-07-07

## 2023-07-07 MED ORDER — FERROUS SULFATE 325 (65 FE) MG PO TABS
325.0000 mg | ORAL_TABLET | Freq: Every day | ORAL | Status: DC
  Administered 2023-07-08 – 2023-07-10 (×3): 325 mg via ORAL
  Filled 2023-07-07 (×3): qty 1

## 2023-07-07 MED ORDER — SODIUM CHLORIDE 0.9 % IV SOLN: INTRAVENOUS | Status: DC

## 2023-07-07 MED ORDER — SODIUM ZIRCONIUM CYCLOSILICATE 10 G PO PACK
10.0000 g | PACK | Freq: Three times a day (TID) | ORAL | Status: DC
  Administered 2023-07-07: 10 g via ORAL
  Filled 2023-07-07 (×3): qty 1

## 2023-07-07 MED ORDER — ESCITALOPRAM OXALATE 10 MG PO TABS
20.0000 mg | ORAL_TABLET | Freq: Every day | ORAL | Status: DC
  Administered 2023-07-08 – 2023-07-10 (×3): 20 mg via ORAL
  Filled 2023-07-07 (×3): qty 2

## 2023-07-07 MED ORDER — ACETAMINOPHEN 325 MG PO TABS
650.0000 mg | ORAL_TABLET | Freq: Four times a day (QID) | ORAL | Status: DC | PRN
  Administered 2023-07-09 – 2023-07-10 (×2): 650 mg via ORAL
  Filled 2023-07-07 (×2): qty 2

## 2023-07-07 MED ORDER — ALLOPURINOL 100 MG PO TABS
100.0000 mg | ORAL_TABLET | Freq: Every day | ORAL | Status: DC
Start: 2023-07-08 — End: 2023-07-10
  Administered 2023-07-08 – 2023-07-10 (×3): 100 mg via ORAL
  Filled 2023-07-07 (×2): qty 1

## 2023-07-07 MED ORDER — FAMOTIDINE 20 MG PO TABS: 40.0000 mg | ORAL_TABLET | Freq: Once | ORAL | Status: DC | PRN

## 2023-07-07 MED ORDER — ONDANSETRON HCL 4 MG/2ML IJ SOLN: 4.0000 mg | Freq: Four times a day (QID) | INTRAMUSCULAR | Status: DC | PRN

## 2023-07-07 NOTE — Assessment & Plan Note (Signed)
-   Continue home regimen 

## 2023-07-07 NOTE — Progress Notes (Signed)
Called [facility] to let RN know patient is being admitted.

## 2023-07-07 NOTE — H&P (Signed)
History and Physical    Patient: Alicia Rogers ZOX:[id number] DOB: [birth year] DOA: 07/07/2023 DOS: the patient was seen and examined on 07/07/2023 PCP: Leanord Asal, Nelva Bush, MD  Patient coming from: SNF  Chief Complaint: No chief complaint on file.  HPI: Alicia Rogers is a 62 y.o. 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   Alicia Rogers states she is in her usual state of health and is not having any discomfort at this time.  She denies any shortness of breath, nausea, vomiting, palpitations.  She notes that she is here for a procedure due to a nonhealing ulcer of her right foot.  She denies any associated pain at this time.  She has been eating and drinking normally.  Hospital Course:  On arrival to the hospital, patient was hypertensive at 149/56 with heart rate of 64.  She was saturating at 98% on room air.  Initial workup notable for potassium of 6.2.  Angiography was hence canceled, and vascular surgery discussed case with nephrology.  Recommended [facility] admission for hyperkalemia.  Review of Systems: As mentioned in the history of present illness. All other systems reviewed and are negative.  Past Medical History:  Diagnosis Date   Anemia    Aortic atherosclerosis (HCC)    Bilateral adrenal adenomas    CKD (chronic kidney disease), stage V (HCC)    Depression    Diabetes mellitus, type II (HCC)    Diverticulosis    Hand swelling 09/27/2021   Hypertension    Paranoid schizophrenia (HCC)    Proteinuria    Schizophrenia (HCC)    Past Surgical History:  Procedure Laterality Date   COLONOSCOPY WITH PROPOFOL N/A 07/21/2022   Procedure: COLONOSCOPY WITH PROPOFOL;  Surgeon: Toney Reil, MD;  Location: [facility] ENDOSCOPY;   Service: Gastroenterology;  Laterality: N/A;   colonscopy     ESOPHAGOGASTRODUODENOSCOPY (EGD) WITH PROPOFOL N/A 07/21/2022   Procedure: ESOPHAGOGASTRODUODENOSCOPY (EGD) WITH PROPOFOL;  Surgeon: Toney Reil, MD;  Location: [facility] ENDOSCOPY;  Service: Gastroenterology;  Laterality: N/A;   Social History:  reports that she has been smoking cigarettes. She started smoking about 45 years ago. She has a 45.2 pack-year smoking history. She has never used smokeless tobacco. She reports that she does not drink alcohol and does not use drugs.  Allergies  Allergen Reactions   Penicillins     Rash   Lisinopril Other (See Comments)    Cough     Family History  Problem Relation Age of Onset   Diabetes Mother    Glaucoma Mother    Heart disease Mother    Heart attack Mother    Depression Mother    Hypertension Mother    Diabetes Father    Glaucoma Father    Hypertension Father    Prostate cancer Father    Diabetes Sister    Depression Sister    Post-traumatic stress disorder Sister     Prior to Admission medications   Medication Sig Start Date End Date Taking? Authorizing Provider  allopurinol (ZYLOPRIM) 100 MG tablet Take 100 mg by mouth daily. 04/06/23 04/05/24 Yes [provider]  amLODipine (NORVASC) 10 MG tablet Take 1 tablet (10 mg total) by mouth daily. 05/17/23 05/16/24 Yes Gillis Santa, MD  BREZTRI AEROSPHERE 160-9-4.8 MCG/ACT AERO Inhale 2 puffs into  the lungs in the morning and at bedtime. Start taking after antibiotics are completed. 09/03/22 08/29/23 Yes Miki Kins, FNP  buPROPion (WELLBUTRIN) 100 MG tablet Take 100 mg by mouth daily. 03/28/23  Yes [provider]  carbamide peroxide (DEBROX) 6.5 % OTIC solution 5 drops 2 (two) times daily.   Yes [provider]  cloNIDine (CATAPRES) 0.1 MG tablet Take 1 tablet (0.1 mg total) by mouth 3 (three) times daily. 05/16/23  Yes Gillis Santa, MD  collagenase (SANTYL) 250 UNIT/GM ointment Apply  topically daily. 05/17/23  Yes Gillis Santa, MD  dapagliflozin propanediol (FARXIGA) 5 MG TABS tablet Take 5 mg by mouth daily.   Yes [provider]  ergocalciferol (VITAMIN D2) 1.25 MG (50000 UT) capsule Take 50,000 Units by mouth once a week. On Fridays   Yes [provider]  escitalopram (LEXAPRO) 20 MG tablet Take 20 mg by mouth daily. 05/21/22  Yes [provider]  ferrous sulfate 325 (65 FE) MG tablet Take 325 mg by mouth 3 (three) times daily with meals. 12/07/21  Yes [provider]  furosemide (LASIX) 20 MG tablet Take 1 tablet (20 mg total) by mouth every other day. 05/16/23  Yes Gillis Santa, MD  gabapentin (NEURONTIN) 100 MG capsule Take 100 mg by mouth 3 (three) times daily. 01/10/22  Yes [provider]  hydrALAZINE (APRESOLINE) 50 MG tablet Take 1 tablet (50 mg total) by mouth 3 (three) times daily. 05/16/23  Yes Gillis Santa, MD  isosorbide mononitrate (IMDUR) 60 MG 24 hr tablet Take 1 tablet (60 mg total) by mouth at bedtime. Hold if SBP <130 05/16/23  Yes Gillis Santa, MD  linagliptin (TRADJENTA) 5 MG TABS tablet Take 5 mg by mouth daily.   Yes [provider]  losartan (COZAAR) 100 MG tablet Take 1 tablet (100 mg total) by mouth daily. 05/16/23  Yes Gillis Santa, MD  Multiple Vitamin (MULTIVITAMIN) capsule Take 1 capsule by mouth daily.   Yes [provider]  omeprazole (PRILOSEC) 40 MG capsule Take 40 mg by mouth daily.   Yes [provider]  Paliperidone ER (INVEGA SUSTENNA) injection Inject 78 mg into the muscle every 30 (thirty) days. 12/29/21  Yes [provider]  TYLENOL 325 MG tablet Take 650 mg by mouth every 8 (eight) hours as needed for mild pain. *DO NOT EXCEED 4GM OF TYLENOL IN 24 HOURS* 04/01/23  Yes [provider]  Zinc Sulfate 220 (50 Zn) MG TABS Take by mouth.   Yes [provider]  aspirin EC 81 MG tablet Take 81 mg by mouth daily. 11/19/21   [provider]   [employer] (POLYOX WSR-301) POWD SMARTSIG:17 Gram(s) By Mouth Daily PRN 01/05/23   [provider]  cefadroxil (DURICEF) 500 MG/5ML suspension Take 500 mg by mouth every 12 (twelve) hours.    [provider]  cyclobenzaprine (FLEXERIL) 10 MG tablet Take 1 tablet (10 mg total) by mouth at bedtime. Patient taking differently: Take 10 mg by mouth at bedtime as needed. 11/10/21   Sarina Ill, DO  feeding supplement (ENSURE ENLIVE / ENSURE PLUS) LIQD Take 237 mLs by mouth 2 (two) times daily between meals. 05/16/23   Gillis Santa, MD  fluticasone (FLONASE) 50 MCG/ACT nasal spray Place 2 sprays into both nostrils daily. 09/03/22 09/03/23  Miki Kins, FNP  lip balm [place] Lemon) OINT Apply 1 Application topically as needed for lip care. 05/16/23   Gillis Santa, MD  loperamide (IMODIUM) 2 MG capsule Take 2  mg by mouth 2 (two) times daily as needed for diarrhea or loose stools. 03/17/21   [provider]  melatonin 3 MG TABS tablet Take 2 tablets (6 mg total) by mouth at bedtime as needed. 05/16/23   Gillis Santa, MD  nystatin (MYCOSTATIN) 100000 UNIT/ML suspension Take by mouth. 06/08/23   [provider]  polyethylene glycol powder (GLYCOLAX/MIRALAX) 17 GM/SCOOP powder Take 17 g by mouth daily as needed.    [provider]  Sodium Hypochlorite 0.057 % GEL Apply topically.    [provider]    Physical Exam: Vitals:   07/07/23 1545 07/07/23 1600 07/07/23 1630 07/07/23 1726  BP: (!) 158/59 (!) 160/64 136/60 (!) 157/68  Pulse: 60 62 62 (!) 54  Resp: 15 14 12 16   Temp:    98 F (36.7 C)  TempSrc:    Oral  SpO2: 100% 100% 98% 100%  Weight:      Height:       Physical Exam Vitals and nursing note reviewed.  Constitutional:      General: She is not in acute distress.    Appearance: She is normal weight.  HENT:     Head: Normocephalic and atraumatic.     Mouth/Throat:     Mouth: Mucous membranes are moist.  Eyes:      Conjunctiva/sclera: Conjunctivae normal.     Pupils: Pupils are equal, round, and reactive to light.  Cardiovascular:     Rate and Rhythm: Normal rate and regular rhythm.     Heart sounds: No murmur heard. Pulmonary:     Effort: Pulmonary effort is normal. No tachypnea or respiratory distress.     Breath sounds: Decreased breath sounds (Diminished throughout) present. No wheezing, rhonchi or rales.  Abdominal:     General: Bowel sounds are normal.     Palpations: Abdomen is soft.  Musculoskeletal:     Comments: Trace bilateral pitting edema  Skin:    General: Skin is warm and dry.  Neurological:     General: No focal deficit present.     Mental Status: She is alert. Mental status is at baseline.  Psychiatric:        Attention and Perception: Attention normal.        Mood and Affect: Affect is blunt and flat.        Speech: Speech normal.        Behavior: Behavior is slowed.        Thought Content: Thought content normal.    Data Reviewed: Potassium level obtained earlier 6.2 BMP with sodium of 139, potassium 5.8, bicarb 19, BUN 54, creatinine 2.81, GFR of 18 CBC obtained yesterday with hemoglobin of 8.6 and platelets of 430  EKG personally reviewed.  Sinus rhythm with rate of 62.  No QT prolongation.  No peaked T waves.  Results are pending, will review when available.  Assessment and Plan:  * Hyperkalemia Per chart review, patient's potassium has been gradually increasing to as high as 6.2 today.  Overall, consistent with worsening renal function.  No evidence of EKG changes.  - Telemetry monitoring - Start Lokelma 10 mg 3 times daily - Recheck BMP in the AM  Chronic kidney disease (CKD), stage V [facility]) Per nephrology notes, patient is scheduled for AV graft insertion on 12/12.  Given hyperkalemia, will consult to see if permacath needs to be placed sooner.  Creatinine is currently stable.  - Nephrology consulted; appreciate their recommendations - Continue home  bicarb  Atherosclerosis of artery  of extremity with ulceration (HCC) Patient was noted to have a ulcer on her right lower extremity that was not healing with abnormal ABIs.  Plan was for angiography today but it has been canceled due to hyperkalemia.  - Vascular surgery following; appreciate their recommendations - Wound care consulted  Type 2 diabetes mellitus with diabetic chronic kidney disease (HCC) - Hold home regimen - SSI, very sensitive  Schizoaffective disorder, depressive type (HCC) - Continue home regimen  Essential hypertension - Continue home regimen  COPD (chronic obstructive pulmonary disease) (HCC) No shortness of breath or cough reported.  No wheezing on examination.  - Continue home Breztri - DuoNebs as needed  Advance Care Planning:   Code Status: Full Code   Consults: None  Family Communication: No family at bedside  Severity of Illness: The appropriate patient status for this patient is OBSERVATION. Observation status is judged to be reasonable and necessary in order to provide the required intensity of service to ensure the patient's safety. The patient's presenting symptoms, physical exam findings, and initial radiographic and laboratory data in the context of their medical condition is felt to place them at decreased risk for further clinical deterioration. Furthermore, it is anticipated that the patient will be medically stable for discharge from the hospital within 2 midnights of admission.   Author: Verdene Lennert, MD 07/07/2023 5:34 PM  For on call review [email or web address].

## 2023-07-07 NOTE — Assessment & Plan Note (Addendum)
Per chart review, patient's potassium has been gradually increasing to as high as 6.2 today.  Overall, consistent with worsening renal function.  No evidence of EKG changes.  - Telemetry monitoring - Start Lokelma 10 mg 3 times daily - Recheck BMP in the AM

## 2023-07-07 NOTE — Interval H&P Note (Signed)
History and Physical Interval Note:  07/07/2023 11:22 AM  Alicia Rogers  has presented today for surgery, with the diagnosis of RLE Angio   ASO w ulceration.  The various methods of treatment have been discussed with the patient and family. After consideration of risks, benefits and other options for treatment, the patient has consented to  Procedure(s): Lower Extremity Angiography (Right) as a surgical intervention.  The patient's history has been reviewed, patient examined, no change in status, stable for surgery.  I have reviewed the patient's chart and labs.  Questions were answered to the patient's satisfaction.     Festus Barren

## 2023-07-07 NOTE — Assessment & Plan Note (Signed)
No shortness of breath or cough reported.  No wheezing on examination.  - Continue home Breztri - DuoNebs as needed

## 2023-07-07 NOTE — Assessment & Plan Note (Addendum)
Per nephrology notes, patient is scheduled for AV graft insertion on 12/12.  Given hyperkalemia, will consult to see if permacath needs to be placed sooner.  Creatinine is currently stable.  - Nephrology consulted; appreciate their recommendations - Continue home bicarb

## 2023-07-07 NOTE — Assessment & Plan Note (Signed)
Patient was noted to have a ulcer on her right lower extremity that was not healing with abnormal ABIs.  Plan was for angiography today but it has been canceled due to hyperkalemia.  - Vascular surgery following; appreciate their recommendations - Wound care consulted

## 2023-07-07 NOTE — Assessment & Plan Note (Signed)
-   Hold home regimen - SSI, very sensitive

## 2023-07-08 ENCOUNTER — Encounter: Payer: Self-pay | Admitting: Vascular Surgery

## 2023-07-08 ENCOUNTER — Encounter
Admission: RE | Disposition: A | Discharge status: Skilled Nursing Facility | Payer: Self-pay | Source: Ambulatory Visit | Attending: Obstetrics and Gynecology

## 2023-07-08 DIAGNOSIS — L97519 Non-pressure chronic ulcer of other part of right foot with unspecified severity: Secondary | ICD-10-CM | POA: Diagnosis not present

## 2023-07-08 DIAGNOSIS — I708 Atherosclerosis of other arteries: Secondary | ICD-10-CM | POA: Diagnosis not present

## 2023-07-08 DIAGNOSIS — E875 Hyperkalemia: Secondary | ICD-10-CM | POA: Diagnosis not present

## 2023-07-08 DIAGNOSIS — I70239 Atherosclerosis of native arteries of right leg with ulceration of unspecified site: Secondary | ICD-10-CM | POA: Diagnosis not present

## 2023-07-08 DIAGNOSIS — I70235 Atherosclerosis of native arteries of right leg with ulceration of other part of foot: Secondary | ICD-10-CM

## 2023-07-08 HISTORY — PX: LOWER EXTREMITY ANGIOGRAPHY: CATH118251

## 2023-07-08 LAB — BASIC METABOLIC PANEL
Anion gap: 8 (ref 5–15)
BUN: 53 mg/dL — ABNORMAL HIGH (ref 8–23)
CO2: 18 mmol/L — ABNORMAL LOW (ref 22–32)
Calcium: 9.4 mg/dL (ref 8.9–10.3)
Chloride: 115 mmol/L — ABNORMAL HIGH (ref 98–111)
Creatinine, Ser: 2.75 mg/dL — ABNORMAL HIGH (ref 0.44–1.00)
GFR, Estimated: 19 mL/min — ABNORMAL LOW (ref >60)
Glucose, Bld: 106 mg/dL — ABNORMAL HIGH (ref 70–99)
Potassium: 4.7 mmol/L (ref 3.5–5.1)
Sodium: 141 mmol/L (ref 135–145)

## 2023-07-08 LAB — GLUCOSE, CAPILLARY
Glucose-Capillary: 98 mg/dL (ref 70–99)
Glucose-Capillary: 117 mg/dL — ABNORMAL HIGH (ref 70–99)
Glucose-Capillary: 204 mg/dL — ABNORMAL HIGH (ref 70–99)
Glucose-Capillary: 168 mg/dL — ABNORMAL HIGH (ref 70–99)

## 2023-07-08 SURGERY — LOWER EXTREMITY ANGIOGRAPHY
Anesthesia: Moderate Sedation | Laterality: Right

## 2023-07-08 MED ORDER — PROSOURCE PLUS PO LIQD
30.0000 mL | Freq: Two times a day (BID) | ORAL | Status: DC
  Administered 2023-07-08 – 2023-07-09 (×2): 30 mL via ORAL
  Filled 2023-07-08 (×5): qty 30

## 2023-07-08 MED ORDER — FENTANYL CITRATE (PF) 100 MCG/2ML IJ SOLN
INTRAMUSCULAR | Status: DC | PRN
  Administered 2023-07-08: 50 ug via INTRAVENOUS
  Administered 2023-07-08: 25 ug via INTRAVENOUS

## 2023-07-08 MED ORDER — ZINC SULFATE 220 (50 ZN) MG PO CAPS
220.0000 mg | ORAL_CAPSULE | Freq: Every day | ORAL | Status: DC
  Administered 2023-07-08 – 2023-07-10 (×3): 220 mg via ORAL
  Filled 2023-07-08 (×3): qty 1

## 2023-07-08 MED ORDER — LIDOCAINE-EPINEPHRINE (PF) 1 %-1:200000 IJ SOLN
INTRAMUSCULAR | Status: DC | PRN
  Administered 2023-07-08: 10 mL

## 2023-07-08 MED ORDER — CHLORHEXIDINE GLUCONATE 0.12 % MT SOLN
15.0000 mL | Freq: Once | OROMUCOSAL | Status: AC
  Administered 2023-07-08: 15 mL via OROMUCOSAL
  Filled 2023-07-08: qty 15

## 2023-07-08 MED ORDER — IODIXANOL 320 MG/ML IV SOLN
INTRAVENOUS | Status: DC | PRN
  Administered 2023-07-08: 55 mL via INTRA_ARTERIAL

## 2023-07-08 MED ORDER — HEPARIN SODIUM (PORCINE) 1000 UNIT/ML IJ SOLN
INTRAMUSCULAR | Status: DC | PRN
  Administered 2023-07-08: 4000 [IU] via INTRAVENOUS

## 2023-07-08 MED ORDER — HEPARIN (PORCINE) IN NACL 1000-0.9 UT/500ML-% IV SOLN
INTRAVENOUS | Status: DC | PRN
  Administered 2023-07-08: 500 mL

## 2023-07-08 MED ORDER — METHYLPREDNISOLONE SODIUM SUCC 125 MG IJ SOLR
INTRAMUSCULAR | Status: AC
  Filled 2023-07-08: qty 2

## 2023-07-08 MED ORDER — RENA-VITE PO TABS
1.0000 | ORAL_TABLET | Freq: Every day | ORAL | Status: DC
  Administered 2023-07-08 – 2023-07-09 (×2): 1 via ORAL
  Filled 2023-07-08 (×2): qty 1

## 2023-07-08 MED ORDER — NEPRO/CARBSTEADY PO LIQD
237.0000 mL | Freq: Three times a day (TID) | ORAL | Status: DC
  Administered 2023-07-09 (×2): 237 mL via ORAL

## 2023-07-08 MED ORDER — SODIUM CHLORIDE 0.9 % IV SOLN: INTRAVENOUS | Status: DC

## 2023-07-08 MED ORDER — CHLORHEXIDINE GLUCONATE CLOTH 2 % EX PADS
6.0000 | MEDICATED_PAD | Freq: Once | CUTANEOUS | Status: AC
  Administered 2023-07-08: 6 via TOPICAL

## 2023-07-08 MED ORDER — FENTANYL CITRATE (PF) 100 MCG/2ML IJ SOLN
INTRAMUSCULAR | Status: AC
  Filled 2023-07-08: qty 2

## 2023-07-08 MED ORDER — SODIUM ZIRCONIUM CYCLOSILICATE 10 G PO PACK
10.0000 g | PACK | Freq: Every day | ORAL | Status: DC
  Administered 2023-07-09 – 2023-07-10 (×2): 10 g via ORAL
  Filled 2023-07-08 (×2): qty 1

## 2023-07-08 MED ORDER — VANCOMYCIN HCL IN DEXTROSE 1-5 GM/200ML-% IV SOLN
INTRAVENOUS | Status: AC
  Filled 2023-07-08: qty 200

## 2023-07-08 MED ORDER — DIPHENHYDRAMINE HCL 50 MG/ML IJ SOLN
50.0000 mg | Freq: Once | INTRAMUSCULAR | Status: AC
  Administered 2023-07-08: 50 mg via INTRAVENOUS

## 2023-07-08 MED ORDER — VITAMIN C 500 MG PO TABS
500.0000 mg | ORAL_TABLET | Freq: Two times a day (BID) | ORAL | Status: DC
  Administered 2023-07-08 – 2023-07-10 (×4): 500 mg via ORAL
  Filled 2023-07-08 (×4): qty 1

## 2023-07-08 MED ORDER — MIDAZOLAM HCL 2 MG/2ML IJ SOLN
INTRAMUSCULAR | Status: DC | PRN
  Administered 2023-07-08: .5 mg via INTRAVENOUS
  Administered 2023-07-08: 1 mg via INTRAVENOUS

## 2023-07-08 MED ORDER — LABETALOL HCL 5 MG/ML IV SOLN
INTRAVENOUS | Status: DC | PRN
  Administered 2023-07-08 (×2): 10 mg via INTRAVENOUS

## 2023-07-08 MED ORDER — SODIUM ZIRCONIUM CYCLOSILICATE 10 G PO PACK
10.0000 g | PACK | Freq: Two times a day (BID) | ORAL | Status: DC
  Filled 2023-07-08: qty 1

## 2023-07-08 MED ORDER — HYDRALAZINE HCL 20 MG/ML IJ SOLN
INTRAMUSCULAR | Status: AC
  Filled 2023-07-08: qty 1

## 2023-07-08 MED ORDER — VANCOMYCIN HCL IN DEXTROSE 1-5 GM/200ML-% IV SOLN
1000.0000 mg | INTRAVENOUS | Status: AC
  Administered 2023-07-08: 1000 mg via INTRAVENOUS
  Filled 2023-07-08: qty 200

## 2023-07-08 MED ORDER — COLLAGENASE 250 UNIT/GM EX OINT
TOPICAL_OINTMENT | Freq: Every day | CUTANEOUS | Status: DC
  Filled 2023-07-08: qty 30

## 2023-07-08 MED ORDER — HYDRALAZINE HCL 20 MG/ML IJ SOLN
INTRAMUSCULAR | Status: DC | PRN
  Administered 2023-07-08: 10 mg via INTRAVENOUS

## 2023-07-08 MED ORDER — LABETALOL HCL 5 MG/ML IV SOLN
INTRAVENOUS | Status: AC
  Filled 2023-07-08: qty 4

## 2023-07-08 MED ORDER — METHYLPREDNISOLONE SODIUM SUCC 125 MG IJ SOLR
125.0000 mg | Freq: Once | INTRAMUSCULAR | Status: AC
  Administered 2023-07-08: 125 mg via INTRAVENOUS

## 2023-07-08 MED ORDER — ORAL CARE MOUTH RINSE: 15.0000 mL | Freq: Once | OROMUCOSAL | Status: AC

## 2023-07-08 MED ORDER — ONDANSETRON HCL 4 MG/2ML IJ SOLN: 4.0000 mg | Freq: Four times a day (QID) | INTRAMUSCULAR | Status: DC | PRN

## 2023-07-08 MED ORDER — DIPHENHYDRAMINE HCL 50 MG/ML IJ SOLN
INTRAMUSCULAR | Status: AC
  Filled 2023-07-08: qty 1

## 2023-07-08 MED ORDER — HEPARIN SODIUM (PORCINE) 1000 UNIT/ML IJ SOLN
INTRAMUSCULAR | Status: AC
  Filled 2023-07-08: qty 10

## 2023-07-08 MED ORDER — MIDAZOLAM HCL 5 MG/5ML IJ SOLN
INTRAMUSCULAR | Status: AC
  Filled 2023-07-08: qty 5

## 2023-07-08 MED ORDER — HYDROMORPHONE HCL 1 MG/ML IJ SOLN: 1.0000 mg | Freq: Once | INTRAMUSCULAR | Status: DC | PRN

## 2023-07-08 SURGICAL SUPPLY — 19 items
BALLN LUTONIX 018 4X300X130 (BALLOONS) ×1
BALLN LUTONIX 018 5X220X130 (BALLOONS) ×1
BALLOON LUTONIX 018 4X300X130 (BALLOONS) IMPLANT
BALLOON LUTONIX 018 5X220X130 (BALLOONS) IMPLANT
CATH ANGIO 5F PIGTAIL 65CM (CATHETERS) IMPLANT
CATH BEACON 5 .038 100 VERT TP (CATHETERS) IMPLANT
CATH ROTAREX 135 6FR (CATHETERS) IMPLANT
CATH TEMPO 5F RIM 65CM (CATHETERS) IMPLANT
COVER PROBE ULTRASOUND 5X96 (MISCELLANEOUS) IMPLANT
DEVICE PRESTO INFLATION (MISCELLANEOUS) IMPLANT
DEVICE STARCLOSE SE CLOSURE (Vascular Products) IMPLANT
GLIDEWIRE ADV .035X260CM (WIRE) IMPLANT
PACK ANGIOGRAPHY (CUSTOM PROCEDURE TRAY) ×1 IMPLANT
SHEATH ANL2 6FRX45 HC (SHEATH) IMPLANT
SHEATH BRITE TIP 5FRX11 (SHEATH) IMPLANT
STENT LIFESTENT 6X170X130 (Permanent Stent) IMPLANT
TUBING CONTRAST HIGH PRESS 72 (TUBING) IMPLANT
WIRE G V18X300CM (WIRE) IMPLANT
WIRE GUIDERIGHT .035X150 (WIRE) IMPLANT

## 2023-07-08 NOTE — Consult Note (Signed)
WOC Nurse Consult Note: Reason for Consult: pressure injuries Wound type: Stage 3 Pressure Injury; left trochanter Stage 3 (healing) Pressure Injury: right heel Healed pressure injury left heel   Pressure Injury POA: Yes Measurement: see nursing flow sheets Wound bed: left trochanter: 50% yellow/50% pale pink, non granular Left heel; intact pink skin Right heel: pale open wound bed, mostly healed  Drainage (amount, consistency, odor) yellow from hip wound, none from heels Periwound: Dressing procedure/placement/frequency: Cleanse left trochanter wound with saline, pat dry. Apply Santyl to the wound bed and/or gauze and place over wound, change daily Prevalon boots bilaterally to offload heels Paint bilateral heel wounds with betadine and allow to air dry.   Re consult if needed, will not follow at this time. Thanks  Shona Pardo [employer], RN,CWOCN, CNS, CWON-AP [phone])

## 2023-07-08 NOTE — Progress Notes (Signed)
Initial Nutrition Assessment  DOCUMENTATION CODES:   Not applicable  INTERVENTION:   -Renal MVI daily -500 mg vitamin C BID -220 mg zinc sulfate daily x 14 days -Liberalize diet to carb modified for wider variety of meal selections -Nepro Shake po TID, each supplement provides 425 kcal and 19 grams protein  -30 ml Prosource Plus BID, each supplement provides 100 kcals and 15 grams protein  NUTRITION DIAGNOSIS:   Increased nutrient needs related to wound healing as evidenced by estimated needs.  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  Pt admitted with hyperkalemia.   12/6- s/p rt lower extremity angiography  Reviewed I/O's: +200 ml x 24 hours  Case discussed with CWOCN; pt with stage 3 pressure injuries to lt trochanter and rt heel. Requesting RD consult for optimization of nutrition to support wound healing.   Pt lying in bed at time of visit, sleepy due to just returning from procedure. No family at bedside.    Pt on a renal diet. Noted meal completions 90%. Per pt, she does not feel hungry at this time, only thirsty. PTA she reports good appetite; she consumes 3 meals per day, but unable to provide a diet recall.   Pt denies any weight loss. Wt has been stable over the past 4 months.   Discussed importance of good meal and supplement intake to promote healing. Pt amenable to supplements.   Per TOC notes, plan to d/c back to SNF ([facility]) once medically stable.   Medications reviewed and include vitamin C,  ferrous sulfate, neurontin, protonix, lokelma, and zinc sulfate.   Lab Results  Component Value Date   HGBA1C 7.6 (H) 05/08/2023   PTA DM medications are 5 mg linagliptin daily.   Labs reviewed: CBGS: 98-204 (inpatient orders for glycemic control are 0-6 units insulin aspart TID with meals).    NUTRITION - FOCUSED PHYSICAL EXAM:  Flowsheet Row Most Recent Value  Orbital Region No depletion  Upper Arm Region No depletion  Thoracic and Lumbar Region No depletion  Buccal Region No depletion  Temple Region No depletion  Clavicle Bone Region No depletion  Clavicle and Acromion Bone Region No depletion  Scapular Bone Region No depletion  Dorsal Hand No depletion  Patellar Region Moderate depletion  Anterior Thigh Region Moderate depletion  Posterior Calf Region Moderate depletion  Edema (RD Assessment) None  Hair Reviewed  Eyes Reviewed  Mouth Reviewed  Skin Reviewed  Nails Reviewed       Diet Order:   Diet Order             Diet Carb Modified Fluid consistency: Thin  Diet effective now                   EDUCATION NEEDS:   Education needs have been addressed  Skin:  Skin Assessment: Skin Integrity Issues: Skin Integrity Issues:: Stage III Stage III: lt trochanter and rt heel  Last BM:  07/08/23 (type 6)  Height:   Ht Readings from Last 1 Encounters:  07/07/23 5\' 2"  (1.575 m)    Weight:  Wt Readings from Last 1 Encounters:  07/07/23 51.4 kg    Ideal Body Weight:  50 kg  BMI:  Body mass index is 20.74 kg/m.  Estimated Nutritional Needs:   Kcal:  1550-1750  Protein:  80-95 grams  Fluid:  > 1.5 L    Levada Schilling, RD, LDN, CDCES Registered Dietitian III Certified Diabetes Care and Education Specialist Please refer to [facility] for RD and/or RD on-call/weekend/after hours pager

## 2023-07-08 NOTE — Progress Notes (Signed)
PROGRESS NOTE    Alicia Rogers  ZOX:[id number] DOB: [birth year] DOA: 07/07/2023 PCP: Norval Morton, MD     Brief Narrative:   From admission h and p  Alicia Rogers is a 62 y.o. 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    Alicia Rogers states she is in her usual state of health and is not having any discomfort at this time.  She denies any shortness of breath, nausea, vomiting, palpitations.  She notes that she is here for a procedure due to a nonhealing ulcer of her right foot.  She denies any associated pain at this time.  She has been eating and drinking normally.  Assessment & Plan:   Principal Problem:   Hyperkalemia Active Problems:   Chronic kidney disease (CKD), stage V (HCC)   Atherosclerosis of artery of extremity with ulceration (HCC)   Type 2 diabetes mellitus with diabetic chronic kidney disease (HCC)   Schizoaffective disorder, depressive type (HCC)   Essential hypertension   Hypertensive urgency   COPD (chronic obstructive pulmonary disease) (HCC)  # Hyperkalemia 2/2 advancing ckd5. Resolved with lokelma - nephrology to see, possibly plans to start dialysis here  # CKD5 With hyperkalemia likely represents progression to esrd - nephrology consulted, possibly start dialysis  # Heel ulcer # PAD Vascular following, likely angiogram this admission  # Heel decubitus ulcer # Left thigh decubitus ulcer Stage 3 on the hip, see photos in media tab - wound care consult  # T2DM Glucose appropriate - SSI - stop home farxiga given kidney function, hyperkalemia  # Schizoaffective disorder No behavioral disturbance - cont home wellbutrin, citalopram, lasix  # Anemia of ckd Hgb 8.6, stable - trend  #  HTN BPs elevated today - cont home amlodipine, clonidine, hydral, imdur - hold losartan given hyperkalemia  # Chronic pain - cont home gabapentin  # COPD Quiescent - home inhalers  # Debility Ambulates with walker at baseline, mostly bedbound - PT/OT consults    DVT prophylaxis: heparin Code Status: full Family Communication: none at bedside. No answer when sister telephoned today  Level of care: Telemetry Medical Status is: Observation     Consultants:  Vascular, nephrology  Procedures: pending  Antimicrobials:  none    Subjective: No pain, tolerating diet  Objective: Vitals:   07/07/23 1726 07/07/23 2055 07/08/23 0026 07/08/23 0438  BP: (!) 157/68 (!) 189/64 (!) 163/57 (!) 168/67  Pulse: (!) 54 67 80 90  Resp: 16 15 19 19   Temp: 98 F (36.7 C) 97.9 F (36.6 C) 99.2 F (37.3 C) 99.5 F (37.5 C)  TempSrc: Oral Oral Oral Oral  SpO2: 100% 100% 100% 95%  Weight:      Height:        Intake/Output Summary (Last 24 hours) at 07/08/2023 0902 Last data filed at 07/07/2023 1830 Gross per 24 hour  Intake 200 ml  Output --  Net 200 ml   Filed Weights   07/07/23 1259  Weight: 51.4 kg    Examination:  General exam: Appears calm and comfortable, chronically ill appearing Respiratory system: Clear to auscultation. Respiratory effort normal. Cardiovascular system: S1 & S2 heard, RRR. Soft systolic murmur Gastrointestinal system: Abdomen is nondistended, soft and nontender. No organomegaly or masses felt. Normal bowel sounds heard. Central nervous  system: Alert and oriented. No focal neurological deficits. Extremities: decreased muscle tone.  Skin:       Psychiatry: Judgement and insight appear normal. Mood & affect appropriate.     Data Reviewed: I have personally reviewed following labs and imaging studies  CBC: Recent Labs  Lab 07/06/23 0951  WBC 9.5  NEUTROABS 6.6  HGB 8.6*  HCT 29.0*  MCV 97.0  PLT 430*   Basic Metabolic  Panel: Recent Labs  Lab 07/06/23 0951 07/07/23 1343 07/07/23 1557 07/08/23 0420  NA 139  --  139 141  K 5.9* 6.2* 5.8* 4.7  CL 113*  --  113* 115*  CO2 18*  --  19* 18*  GLUCOSE 85  --  82 106*  BUN 61*  --  54* 53*  CREATININE 2.68*  --  2.81* 2.75*  CALCIUM 9.4  --  9.6 9.4   GFR: Estimated Creatinine Clearance: 16.6 mL/min (A) (by C-G formula based on SCr of 2.75 mg/dL (H)). Liver Function Tests: No results for input(s): "AST", "ALT", "ALKPHOS", "BILITOT", "PROT", "ALBUMIN" in the last 168 hours. No results for input(s): "LIPASE", "AMYLASE" in the last 168 hours. No results for input(s): "AMMONIA" in the last 168 hours. Coagulation Profile: No results for input(s): "INR", "PROTIME" in the last 168 hours. Cardiac Enzymes: No results for input(s): "CKTOTAL", "CKMB", "CKMBINDEX", "TROPONINI" in the last 168 hours. BNP (last 3 results) No results for input(s): "PROBNP" in the last 8760 hours. HbA1C: No results for input(s): "HGBA1C" in the last 72 hours. CBG: Recent Labs  Lab 07/07/23 2241 07/08/23 0807  GLUCAP 105* 98   Lipid Profile: No results for input(s): "CHOL", "HDL", "LDLCALC", "TRIG", "CHOLHDL", "LDLDIRECT" in the last 72 hours. Thyroid Function Tests: No results for input(s): "TSH", "T4TOTAL", "FREET4", "T3FREE", "THYROIDAB" in the last 72 hours. Anemia Panel: No results for input(s): "VITAMINB12", "FOLATE", "FERRITIN", "TIBC", "IRON", "RETICCTPCT" in the last 72 hours. Urine analysis:    Component Value Date/Time   COLORURINE YELLOW (A) 05/07/2023 2308   APPEARANCEUR CLEAR (A) 05/07/2023 2308   APPEARANCEUR Cloudy (A) 03/11/2020 1629   LABSPEC 1.008 05/07/2023 2308   PHURINE 7.0 05/07/2023 2308   GLUCOSEU >=500 (A) 05/07/2023 2308   HGBUR NEGATIVE 05/07/2023 2308   BILIRUBINUR NEGATIVE 05/07/2023 2308   BILIRUBINUR Negative 03/11/2020 1629   KETONESUR NEGATIVE 05/07/2023 2308   PROTEINUR >=300 (A) 05/07/2023 2308   NITRITE NEGATIVE 05/07/2023 2308    LEUKOCYTESUR TRACE (A) 05/07/2023 2308   Sepsis Labs: @LABRCNTIP (procalcitonin:4,lacticidven:4)  )No results found for this or any previous visit (from the past 240 hour(s)).       Radiology Studies: No results found.      Scheduled Meds:  allopurinol  100 mg Oral Daily   amLODipine  10 mg Oral Daily   budesonide (PULMICORT) nebulizer solution  0.25 mg Nebulization BID   buPROPion  100 mg Oral Daily   carbamide peroxide  5 drop Both EARS BID   cloNIDine  0.1 mg Oral TID   dapagliflozin propanediol  5 mg Oral Daily   escitalopram  20 mg Oral Daily   ferrous sulfate  325 mg Oral Q breakfast   [START ON 07/09/2023] furosemide  20 mg Oral QODAY   gabapentin  100 mg Oral TID   heparin  5,000 Units Subcutaneous Q8H   hydrALAZINE  50 mg Oral TID   insulin aspart  0-6 Units Subcutaneous TID WC   isosorbide mononitrate  60 mg Oral QHS   losartan  100 mg Oral Daily  pantoprazole  40 mg Oral Daily   sodium chloride flush  3 mL Intravenous Q12H   sodium zirconium cyclosilicate  10 g Oral TID   umeclidinium-vilanterol  1 puff Inhalation Daily   Continuous Infusions:  sodium chloride 10 mL/hr at 07/08/23 0701   vancomycin       LOS: 0 days     Silvano Bilis, MD [facility]   If 7PM-7AM, please contact night-coverage [email or web address] Password [email or web address] 07/08/2023, 9:02 AM

## 2023-07-08 NOTE — Progress Notes (Signed)
PT Cancellation Note  Patient Details Name: Alicia Rogers MRN: [id number] DOB: 16-Mar-1961   Cancelled Treatment:    Reason Eval/Treat Not Completed: Patient at procedure or test/unavailable, will attempt to see pt at a future date/time as medically appropriate.     Ovidio Hanger PT, DPT 07/08/23, 1:35 PM

## 2023-07-08 NOTE — Interval H&P Note (Signed)
History and Physical Interval Note:  07/08/2023 10:48 AM  Alicia Rogers  has presented today for surgery, with the diagnosis of PAD.  The various methods of treatment have been discussed with the patient and family. After consideration of risks, benefits and other options for treatment, the patient has consented to  Procedure(s): Lower Extremity Angiography (Right) as a surgical intervention.  The patient's history has been reviewed, patient examined, no change in status, stable for surgery.  I have reviewed the patient's chart and labs.  Questions were answered to the patient's satisfaction.     Festus Barren

## 2023-07-08 NOTE — Progress Notes (Signed)
[facility]  ROUNDING NOTE   Subjective:   Alicia Rogers is a 62 year old female who presents to the hospital for scheduled angiogram.  Angiogram deferred due to hyperkalemia.  Patient seen resting in bed, in procedure area this morning.  Denies any current pain or discomfort.  Alert and oriented.  N.p.o. for procedure.  Potassium 5.8 yesterday, patient received Lokelma yesterday.  We have been consulted for further assistance with management with hyperkalemia.   Objective:  Vital signs in last 24 hours:  Temp:  [97.9 F (36.6 C)-99.5 F (37.5 C)] 99.2 F (37.3 C) (12/06 1032) Pulse Rate:  [0-97] 78 (12/06 1345) Resp:  [0-19] 15 (12/06 1345) BP: (157-199)/(57-84) 176/68 (12/06 1345) SpO2:  [95 %-100 %] 95 % (12/06 1345)  Weight change:  Filed Weights   07/07/23 1259  Weight: 51.4 kg    Intake/Output: I/O last 3 completed shifts: In: 200 [P.O.:200] Out: -    Intake/Output this shift:  No intake/output data recorded.  Physical Exam: General: NAD  Head: Normocephalic, atraumatic. Moist oral mucosal membranes  Eyes: Anicteric  Neck: Supple, trachea midline  Lungs:  Clear to auscultation, normal effort  Heart: Regular rate and rhythm  Abdomen:  Soft, nontender, nondistended  Extremities: No peripheral edema.  Neurologic: Nonfocal, moving all four extremities  Skin: No lesions       Basic Metabolic Panel: Recent Labs  Lab 07/06/23 0951 07/07/23 1343 07/07/23 1557 07/08/23 0420  NA 139  --  139 141  K 5.9* 6.2* 5.8* 4.7  CL 113*  --  113* 115*  CO2 18*  --  19* 18*  GLUCOSE 85  --  82 106*  BUN 61*  --  54* 53*  CREATININE 2.68*  --  2.81* 2.75*  CALCIUM 9.4  --  9.6 9.4    Liver Function Tests: No results for input(s): "AST", "ALT", "ALKPHOS", "BILITOT", "PROT", "ALBUMIN" in the last 168 hours. No results for input(s): "LIPASE", "AMYLASE" in the last 168 hours. No results for input(s): "AMMONIA" in the last 168 hours.  CBC: Recent  Labs  Lab 07/06/23 0951  WBC 9.5  NEUTROABS 6.6  HGB 8.6*  HCT 29.0*  MCV 97.0  PLT 430*    Cardiac Enzymes: No results for input(s): "CKTOTAL", "CKMB", "CKMBINDEX", "TROPONINI" in the last 168 hours.  BNP: Invalid input(s): "POCBNP"  CBG: Recent Labs  Lab 07/07/23 2241 07/08/23 0807 07/08/23 1039 07/08/23 1541  GLUCAP 105* 98 117* 204*    Microbiology: Results for orders placed or performed during the hospital encounter of 05/07/23  Resp panel by RT-PCR (RSV, Flu A&B, Covid) Anterior Nasal Swab     Status: None   Collection Time: 05/07/23 10:26 PM   Specimen: Anterior Nasal Swab  Result Value Ref Range Status   SARS Coronavirus 2 by RT PCR NEGATIVE NEGATIVE Final    Comment: (NOTE) SARS-CoV-2 target nucleic acids are NOT DETECTED.  The SARS-CoV-2 RNA is generally detectable in upper respiratory specimens during the acute phase of infection. The lowest concentration of SARS-CoV-2 viral copies this assay can detect is 138 copies/mL. A negative result does not preclude SARS-Cov-2 infection and should not be used as the sole basis for treatment or other patient management decisions. A negative result may occur with  improper specimen collection/handling, submission of specimen other than nasopharyngeal swab, presence of viral mutation(s) within the areas targeted by this assay, and inadequate number of viral copies(<138 copies/mL). A negative result must be combined with clinical observations, patient history, and  epidemiological information. The expected result is Negative.  Fact Sheet for Patients:  BloggerCourse.com  Fact Sheet for Healthcare Providers:  SeriousBroker.it  This test is no t yet approved or cleared by the [place] FDA and  has been authorized for detection and/or diagnosis of SARS-CoV-2 by FDA under an Emergency Use Authorization (EUA). This EUA will remain  in effect (meaning this test can  be used) for the duration of the COVID-19 declaration under Section 564(b)(1) of the Act, 21 U.S.C.section 360bbb-3(b)(1), unless the authorization is terminated  or revoked sooner.       Influenza A by PCR NEGATIVE NEGATIVE Final   Influenza B by PCR NEGATIVE NEGATIVE Final    Comment: (NOTE) The Xpert Xpress SARS-CoV-2/FLU/RSV plus assay is intended as an aid in the diagnosis of influenza from Nasopharyngeal swab specimens and should not be used as a sole basis for treatment. Nasal washings and aspirates are unacceptable for Xpert Xpress SARS-CoV-2/FLU/RSV testing.  Fact Sheet for Patients: BloggerCourse.com  Fact Sheet for Healthcare Providers: SeriousBroker.it  This test is not yet approved or cleared by the [place] FDA and has been authorized for detection and/or diagnosis of SARS-CoV-2 by FDA under an Emergency Use Authorization (EUA). This EUA will remain in effect (meaning this test can be used) for the duration of the COVID-19 declaration under Section 564(b)(1) of the Act, 21 U.S.C. section 360bbb-3(b)(1), unless the authorization is terminated or revoked.     Resp Syncytial Virus by PCR NEGATIVE NEGATIVE Final    Comment: (NOTE) Fact Sheet for Patients: BloggerCourse.com  Fact Sheet for Healthcare Providers: SeriousBroker.it  This test is not yet approved or cleared by the [place] FDA and has been authorized for detection and/or diagnosis of SARS-CoV-2 by FDA under an Emergency Use Authorization (EUA). This EUA will remain in effect (meaning this test can be used) for the duration of the COVID-19 declaration under Section 564(b)(1) of the Act, 21 U.S.C. section 360bbb-3(b)(1), unless the authorization is terminated or revoked.  Performed at [facility], [address] [place], [place] [address]   Aerobic/Anaerobic Culture w Gram Stain  (surgical/deep wound)     Status: None   Collection Time: 05/09/23 11:30 AM   Specimen: Wound  Result Value Ref Range Status   Specimen Description   Final    WOUND Performed at [facility], [address], [place], [place] [address]    Special Requests   Final    LEFT HIP Performed at [facility], [address] [place], [place] [address]    Gram Stain   Final    RARE WBC PRESENT, PREDOMINANTLY PMN MODERATE GRAM NEGATIVE RODS MODERATE GRAM POSITIVE COCCI IN PAIRS    Culture   Final    ABUNDANT STAPHYLOCOCCUS AUREUS ABUNDANT STREPTOCOCCUS MITIS/ORALIS ABUNDANT BACTEROIDES FRAGILIS BETA LACTAMASE POSITIVE Performed at [facility], [address], [place], [place] [address]    Report Status 05/12/2023 FINAL  Final   Organism ID, Bacteria STAPHYLOCOCCUS AUREUS  Final   Organism ID, Bacteria STREPTOCOCCUS MITIS/ORALIS  Final      Susceptibility   Staphylococcus aureus - MIC*    CIPROFLOXACIN <=0.5 SENSITIVE Sensitive     ERYTHROMYCIN 4 INTERMEDIATE Intermediate     GENTAMICIN <=0.5 SENSITIVE Sensitive     OXACILLIN <=0.25 SENSITIVE Sensitive     TETRACYCLINE <=1 SENSITIVE Sensitive     VANCOMYCIN 1 SENSITIVE Sensitive     TRIMETH/SULFA <=10 SENSITIVE Sensitive     CLINDAMYCIN <=0.25 SENSITIVE Sensitive  RIFAMPIN <=0.5 SENSITIVE Sensitive     Inducible Clindamycin NEGATIVE Sensitive     LINEZOLID 2 SENSITIVE Sensitive     * ABUNDANT STAPHYLOCOCCUS AUREUS   Streptococcus mitis/oralis - MIC*    TETRACYCLINE >=16 RESISTANT Resistant     VANCOMYCIN 0.5 SENSITIVE Sensitive     CLINDAMYCIN >=1 RESISTANT Resistant     * ABUNDANT STREPTOCOCCUS MITIS/ORALIS    Coagulation Studies: No results for input(s): "LABPROT", "INR" in the last 72 hours.  Urinalysis: No results for input(s): "COLORURINE", "LABSPEC", "PHURINE", "GLUCOSEU", "HGBUR", "BILIRUBINUR", "KETONESUR", "PROTEINUR", "UROBILINOGEN", "NITRITE", "LEUKOCYTESUR" in the last 72  hours.  Invalid input(s): "APPERANCEUR"    Imaging: PERIPHERAL VASCULAR CATHETERIZATION  Result Date: 07/08/2023 See surgical note for result.    Medications:     (feeding supplement) PROSource Plus  30 mL Oral BID BM   allopurinol  100 mg Oral Daily   amLODipine  10 mg Oral Daily   vitamin C  500 mg Oral BID   budesonide (PULMICORT) nebulizer solution  0.25 mg Nebulization BID   buPROPion  100 mg Oral Daily   carbamide peroxide  5 drop Both EARS BID   cloNIDine  0.1 mg Oral TID   collagenase   Topical Daily   escitalopram  20 mg Oral Daily   feeding supplement (NEPRO CARB STEADY)  237 mL Oral TID BM   ferrous sulfate  325 mg Oral Q breakfast   [START ON 07/09/2023] furosemide  20 mg Oral QODAY   gabapentin  100 mg Oral TID   heparin  5,000 Units Subcutaneous Q8H   hydrALAZINE  50 mg Oral TID   insulin aspart  0-6 Units Subcutaneous TID WC   isosorbide mononitrate  60 mg Oral QHS   losartan  100 mg Oral Daily   multivitamin  1 tablet Oral QHS   pantoprazole  40 mg Oral Daily   sodium chloride flush  3 mL Intravenous Q12H   [START ON 07/09/2023] sodium zirconium cyclosilicate  10 g Oral Daily   umeclidinium-vilanterol  1 puff Inhalation Daily   zinc sulfate (50mg  elemental zinc)  220 mg Oral Daily   acetaminophen **OR** acetaminophen, cyclobenzaprine, HYDROmorphone (DILAUDID) injection, ondansetron (ZOFRAN) IV  Assessment/ Plan:  Alicia Rogers is a 62 y.o.  female who presents for scheduled angiogram but found to have hyperkalemia.  Hyperkalemia with chronic kidney disease stage IV.  Patient remains at baseline renal function.  Potassium 5.8.  Patient received Lokelma.  Potassium corrected to 4.7 today.  Recommend Lokelma 10 g daily outpatient with close follow-up in our office.  2.  Hypertension with chronic kidney disease.  Currently receiving amlodipine, clonidine, furosemide, hydralazine, isosorbide, and losartan,.  Blood pressure remains slightly elevated,  176/68.  3. Diabetes mellitus type II with chronic kidney disease/renal manifestations: noninsulin dependent. Home regimen includes [place]. Most recent hemoglobin A1c is 7.6 on 05/08/2023.      LOS: 0 Yisroel Mullendore 12/6/20244:46 PM

## 2023-07-08 NOTE — TOC Initial Note (Signed)
Transition of Care [facility]) - Initial/Assessment Note    Patient Details  Name: Alicia Rogers MRN: [id number] Date of Birth: [birth year]  Transition of Care [facility]) CM/SW Contact:    Liliana Cline, LCSW Phone Number: 07/08/2023, 3:33 PM  Clinical Narrative:                 Met with patient at bedside. Patient states she is from [facility] in [place] and is a long term resident there. Patient states her plan is to return to [facility] when medically ready.  CSW also spoke with Gena at [facility] who confirms patient is LTC and can return when ready. If patient needs rehab, she will need PT/OT evals and insurance auth. Patient will need FL2 completed based on whether or not she needs rehab. Updated TOC handoff.  Expected Discharge Plan: Skilled Nursing Facility Barriers to Discharge: Continued Medical Work up   Patient Goals and CMS Choice Patient states their goals for this hospitalization and ongoing recovery are:: SNF CMS Medicare.gov Compare Post Acute Care list provided to:: Patient Choice offered to / list presented to : Patient      Expected Discharge Plan and Services       Living arrangements for the past 2 months: Skilled Nursing Facility                                      Prior Living Arrangements/Services Living arrangements for the past 2 months: Skilled Nursing Facility Lives with:: Facility Resident Patient language and need for interpreter reviewed:: Yes Do you feel safe going back to the place where you live?: Yes      Need for Family Participation in Patient Care: Yes (Comment) Care giver support system in place?: Yes (comment)   Criminal Activity/Legal Involvement Pertinent to Current Situation/Hospitalization: No - Comment as needed  Activities of Daily Living   ADL Screening (condition at time of admission) Independently performs ADLs?: No Does the patient have a NEW difficulty with bathing/dressing/toileting/self-feeding that is expected to last  >3 days?: Yes (Initiates electronic notice to provider for possible OT consult) Does the patient have a NEW difficulty with getting in/out of bed, walking, or climbing stairs that is expected to last >3 days?: Yes (Initiates electronic notice to provider for possible PT consult) Does the patient have a NEW difficulty with communication that is expected to last >3 days?: No Is the patient deaf or have difficulty hearing?: No Does the patient have difficulty seeing, even when wearing glasses/contacts?: No Does the patient have difficulty concentrating, remembering, or making decisions?: No  Permission Sought/Granted Permission sought to share information with : Facility Industrial/product designer granted to share information with : Yes, Verbal Permission Granted     Permission granted to share info w AGENCY: [facility]        Emotional Assessment       Orientation: : Oriented to Self, Oriented to Place, Oriented to  Time, Oriented to Situation Alcohol / Substance Use: Not Applicable Psych Involvement: No (comment)  Admission diagnosis:  Hyperkalemia [E87.5] Patient Active Problem List   Diagnosis Date Noted   Hyperkalemia 07/07/2023   Atherosclerosis of artery of extremity with ulceration (HCC) 07/07/2023   Malnutrition of moderate degree 05/12/2023   Pressure injury of hip, unstageable (HCC) 05/09/2023   Acute urinary retention 05/08/2023   Bilateral lower extremity edema 05/08/2023   [name] (acute kidney injury) (HCC) 05/08/2023  Anasarca 05/07/2023   Hypoxia 05/07/2023   Chronic kidney disease (CKD), stage V (HCC) 05/07/2023   Leukocytosis 05/07/2023   Bilateral pleural effusion 05/07/2023   Thyroid nodule 03/09/2023   Postoperative examination 11/03/2022   Acute-on-chronic kidney injury (HCC) 09/30/2022   Postoperative ileus (HCC) 09/30/2022   Postoperative intra-abdominal abscess (HCC) 09/30/2022   COPD (chronic obstructive pulmonary disease) (HCC) 09/03/2022   Vitamin D  deficiency, unspecified 09/03/2022   Allergic rhinitis 09/03/2022   Tobacco abuse counseling 09/03/2022   Pneumonia due to respiratory syncytial virus (RSV) 08/30/2022   Pulmonary nodule 08/30/2022   Iron deficiency anemia 07/21/2022   Gastric erosion 07/21/2022   Polyp of duodenum 07/21/2022   Adenomatous polyp of descending colon 07/21/2022   Dysuria 06/30/2022   MGUS (monoclonal gammopathy of unknown significance) 02/05/2022   Stage 3b chronic kidney disease (HCC) 12/22/2021   Paranoid schizophrenia (HCC) 11/25/2021   Proteinuria, unspecified 11/25/2021   Symptomatic anemia 11/25/2021   Schizoaffective disorder, bipolar type (HCC) 09/29/2021   Hypertensive urgency 09/28/2021   Sinus bradycardia 09/27/2021   Anemia in chronic kidney disease (CKD) 09/27/2021   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   Uterine mass 03/03/2021   Essential hypertension 02/28/2020   Back pain 02/28/2020   Hematuria 02/28/2020   Irritant contact dermatitis due to other agents 03/08/2018   Increased frequency of urination 09/21/2017   Chronic kidney disease, stage 4 (severe) (HCC) 08/26/2017   Low back pain 05/26/2017   Extrapyramidal symptom 03/12/2016   Tobacco use disorder 10/11/2014   Mixed Hyperlipidemia 07/04/2014   Claudication (HCC) 06/09/2014   Schizoaffective disorder, depressive type (HCC) 06/09/2014   Diabetes mellitus, type 2 (HCC) 06/09/2014   Type 2 diabetes mellitus with diabetic chronic kidney disease (HCC) 06/09/2014   PCP:  Norval Morton, MD Pharmacy:   [facility] - [place], [place] - [address] [place] [address] Phone: [phone] Fax: [phone]     Social Determinants of Health (SDOH) Social History: SDOH Screenings   Food Insecurity: No Food Insecurity (07/08/2023)  Housing: Patient Declined (07/08/2023)  Transportation Needs: No Transportation Needs (07/08/2023)  Utilities: Not At Risk (07/08/2023)  Alcohol Screen:  Low Risk  (09/29/2021)  Financial Resource Strain: Medium Risk (03/04/2023)   Received from [facility]  Social Connections: Unknown (09/28/2022)   [facility] from [facility]  Tobacco Use: High Risk (07/07/2023)   SDOH Interventions:     Readmission Risk Interventions    05/10/2023   10:05 AM  Readmission Risk Prevention Plan  Transportation Screening Complete  HRI or Home Care Consult Complete  Palliative Care Screening Complete  Medication Review (RN Care Manager) Complete

## 2023-07-08 NOTE — Care Management Obs Status (Addendum)
MEDICARE OBSERVATION STATUS NOTIFICATION   Patient Details  Name: Alicia Rogers MRN: [id number] Date of Birth: [birth year]   Medicare Observation Status Notification Given:  Yes  Met with patient at bedside for MOON. Copy provided to the patient.   Shacola Schussler E Kadee Philyaw, LCSW 07/08/2023, 3:33 PM

## 2023-07-08 NOTE — Progress Notes (Signed)
OT Cancellation Note  Patient Details Name: Alicia Rogers MRN: [id number] DOB: 07/13/1961   Cancelled Treatment:    Reason Eval/Treat Not Completed: Patient at procedure or test/ unavailable. Order received, chart reviewed. Pt off unit for procedure this AM, will re-attempt as pt available to initiate services.   Kathie Dike, M.S. OTR/L  07/08/23, 10:47 AM  ascom [phone]

## 2023-07-08 NOTE — Op Note (Signed)
[facility]  Percutaneous Study/Intervention Procedural Note   Date of Surgery: 07/08/2023  Surgeon(s):Mollie Rossano    Assistants:none  Pre-operative Diagnosis: PAD with ulceration right lower extremity  Post-operative diagnosis:  Same  Procedure(s) Performed:             1.  Ultrasound guidance for vascular access left femoral artery             2.  Catheter placement into right common femoral artery from left femoral approach             3.  Aortogram and selective right lower extremity angiogram             4.  Atherectomy of right SFA and above-knee popliteal artery with the [place] Rex device             5.  Percutaneous transluminal angioplasty of the right SFA and above-knee popliteal artery with 4 mm diameter by 30 cm length Lutonix drug-coated angioplasty balloon  6.  Stent placement to the right SFA with 6 mm diameter by 17 cm length life stent             7.  StarClose closure device left femoral artery  EBL: 50 cc  Contrast: 55 cc  Fluoro Time: 7.3 minutes  Moderate Conscious Sedation Time: approximately 50 minutes using 1.5 mg of Versed and 75 mcg of Fentanyl              Indications:  Patient is a 62 y.o.female with nonhealing ulcerations on the right foot. The patient has noninvasive study showing significantly reduced right ABI. The patient is brought in for angiography for further evaluation and potential treatment.  Due to the limb threatening nature of the situation, angiogram was performed for attempted limb salvage. The patient is aware that if the procedure fails, amputation would be expected.  The patient also understands that even with successful revascularization, amputation may still be required due to the severity of the situation.  Risks and benefits are discussed and informed consent is obtained.   Procedure:  The patient was identified and appropriate procedural time out was performed.  The patient was then placed supine on the table  and prepped and draped in the usual sterile fashion. Moderate conscious sedation was administered during a face to face encounter with the patient throughout the procedure with my supervision of the RN administering medicines and monitoring the patient's vital signs, pulse oximetry, telemetry and mental status throughout from the start of the procedure until the patient was taken to the recovery room. Ultrasound was used to evaluate the left common femoral artery.  It was patent .  A digital ultrasound image was acquired.  A Seldinger needle was used to access the left common femoral artery under direct ultrasound guidance and a permanent image was performed.  A 0.035 J wire was advanced without resistance and a 5Fr sheath was placed.  Pigtail catheter was placed into the aorta and an AP aortogram was performed. This demonstrated normal renal arteries and normal aorta and iliac segments without significant stenosis. I then crossed the aortic bifurcation and advanced to the right femoral head. Selective right lower extremity angiogram was then performed. This demonstrated diffuse disease of the right SFA and proximal popliteal artery with multiple greater than 80% stenoses.  The vessel then normalized for about 8 to 10 cm and in the popliteal artery just above the knee was another area of about 80% stenosis.  The below-knee popliteal  artery was fairly normal and there was then three-vessel runoff distally without significant tibial stenosis. It was felt that it was in the patient's best interest to proceed with intervention after these images to avoid a second procedure and a larger amount of contrast and fluoroscopy based off of the findings from the initial angiogram. The patient was systemically heparinized and a 6 [place] Ansell sheath was then placed over the [employer] wire. I then used a Kumpe catheter and the advantage wire to navigate through the stenoses without difficulty and confirm intraluminal flow  in the below-knee popliteal artery.  I then placed a V18 wire.  I started by performing atherectomy for the right SFA and above-knee popliteal artery with 2 passes with the [place] Rex device.  Following this, a better channel was seen but significant disease with multiple areas of greater than 60% residual stenosis remained.  A 4 mm diameter by 30 cm length Lutonix drug-coated angioplasty balloon was inflated to 10 atm for 1 minute.  Completion imaging showed the popliteal artery only to have about a 25 to 30% residual stenosis, but 3 areas in the proximal and mid superficial femoral artery still had greater than 50% stenosis.  A 6 mm diameter by 17 cm length life stent was then deployed and postdilated with a 5 mm diameter Lutonix drug-coated balloon with excellent angiographic completion result and less than 10% residual stenosis. I elected to terminate the procedure. The sheath was removed and StarClose closure device was deployed in the left femoral artery with excellent hemostatic result. The patient was taken to the recovery room in stable condition having tolerated the procedure well.  Findings:               Aortogram:  This demonstrated normal renal arteries and normal aorta and iliac segments without significant stenosis although the vessels were small and mildly diseased             Right lower Extremity:  This demonstrated diffuse disease of the right SFA and proximal popliteal artery with multiple greater than 80% stenoses.  The vessel then normalized for about 8 to 10 cm and in the popliteal artery just above the knee was another area of about 80% stenosis.  The below-knee popliteal artery was fairly normal and there was then three-vessel runoff distally without significant tibial stenosis.   Disposition: Patient was taken to the recovery room in stable condition having tolerated the procedure well.  Complications: None  Festus Barren 07/08/2023 12:47 PM   This note was created with Dragon  Medical transcription system. Any errors in dictation are purely unintentional.

## 2023-07-08 NOTE — Progress Notes (Signed)
At the end of lower extremity angiogram procedure, this RN saw that pt's lower face and lips were swollen. No airway compromise or difficulty breathing. MD notified and verbal orders received. Meds administered and pt transferred to recovery. Receiving RN notified.

## 2023-07-09 DIAGNOSIS — I70239 Atherosclerosis of native arteries of right leg with ulceration of unspecified site: Secondary | ICD-10-CM | POA: Diagnosis not present

## 2023-07-09 DIAGNOSIS — E875 Hyperkalemia: Secondary | ICD-10-CM | POA: Diagnosis not present

## 2023-07-09 LAB — BASIC METABOLIC PANEL
Anion gap: 7 (ref 5–15)
BUN: 57 mg/dL — ABNORMAL HIGH (ref 8–23)
CO2: 19 mmol/L — ABNORMAL LOW (ref 22–32)
Calcium: 9.2 mg/dL (ref 8.9–10.3)
Chloride: 113 mmol/L — ABNORMAL HIGH (ref 98–111)
Creatinine, Ser: 3.1 mg/dL — ABNORMAL HIGH (ref 0.44–1.00)
GFR, Estimated: 16 mL/min — ABNORMAL LOW (ref >60)
Glucose, Bld: 95 mg/dL (ref 70–99)
Potassium: 5.3 mmol/L — ABNORMAL HIGH (ref 3.5–5.1)
Sodium: 139 mmol/L (ref 135–145)

## 2023-07-09 LAB — CBC
HCT: 27 % — ABNORMAL LOW (ref 36.0–46.0)
Hemoglobin: 8.4 g/dL — ABNORMAL LOW (ref 12.0–15.0)
MCH: 29.3 pg (ref 26.0–34.0)
MCHC: 31.1 g/dL (ref 30.0–36.0)
MCV: 94.1 fL (ref 80.0–100.0)
Platelets: 359 10*3/uL (ref 150–400)
RBC: 2.87 MIL/uL — ABNORMAL LOW (ref 3.87–5.11)
RDW: 15.1 % (ref 11.5–15.5)
WBC: 15.6 10*3/uL — ABNORMAL HIGH (ref 4.0–10.5)
nRBC: 0.3 % — ABNORMAL HIGH (ref 0.0–0.2)

## 2023-07-09 LAB — GLUCOSE, CAPILLARY
Glucose-Capillary: 96 mg/dL (ref 70–99)
Glucose-Capillary: 157 mg/dL — ABNORMAL HIGH (ref 70–99)
Glucose-Capillary: 140 mg/dL — ABNORMAL HIGH (ref 70–99)

## 2023-07-09 MED ORDER — SODIUM ZIRCONIUM CYCLOSILICATE 10 G PO PACK: 10.0000 g | PACK | Freq: Every day | ORAL | Status: DC

## 2023-07-09 NOTE — Evaluation (Signed)
Occupational Therapy Evaluation Patient Details Name: Alicia Rogers MRN: [id number] DOB: 08-08-1960 Today's Date: 07/09/2023   History of Present Illness Alicia Rogers is a 62 y.o. 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   Clinical Impression   [name] was seen for OT evaluation this date. Prior to hospital admission, pt was LTC resident at [facility], ambulatory with rollator, required 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. Pt currently requires MAX A don B socks and shoes in sitting. SUPERVISION + 4WW for toilet t/f and pericare sitting/standing. SETUP for meals. Pt presenting at baseline functioning for ADLs and functional mobility, no skilled acute OT needs identified, will sign off. Upon hospital discharge, recommend return to LTC facility with no OT follow up.    If plan is discharge home, recommend the following: Help with stairs or ramp for entrance    Functional Status Assessment  Patient has not had a recent decline in their functional status  Equipment Recommendations  None recommended by OT    Recommendations for Other Services       Precautions / Restrictions Precautions Precautions: Fall Restrictions Weight Bearing Restrictions: No      Mobility Bed Mobility Overal bed mobility: Needs Assistance Bed Mobility: Supine to Sit     Supine to sit: Mod assist          Transfers Overall transfer level: Needs assistance Equipment used: Rollator (4 wheels) Transfers: Sit to/from Stand Sit to Stand: Supervision                  Balance Overall balance assessment: Needs assistance Sitting-balance support: No upper extremity supported, Feet supported Sitting balance-Leahy Scale: Good     Standing balance  support: No upper extremity supported, During functional activity Standing balance-Leahy Scale: Fair                             ADL either performed or assessed with clinical judgement   ADL Overall ADL's : Needs assistance/impaired                                       General ADL Comments: MAX A don B socks and shoes in sitting. SUPERVISION + 4WW for toilet t/f and pericare sitting/standing. SETUP for meals.       Pertinent Vitals/Pain Pain Assessment Pain Assessment: No/denies pain     Extremity/Trunk Assessment Upper Extremity Assessment Upper Extremity Assessment: LUE deficits/detail;Overall WFL for tasks assessed LUE Deficits / Details: chronic L hand contracture   Lower Extremity Assessment Lower Extremity Assessment: Overall WFL for tasks assessed       Communication Communication Communication: No apparent difficulties   Cognition Arousal: Alert Behavior During Therapy: Flat affect Overall Cognitive Status: Within Functional Limits for tasks assessed                                                  Home Living Family/patient expects to be discharged to:: Skilled nursing facility  Additional Comments: [facility] LTC resident, ambulatory with rollator, 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      Prior Functioning/Environment Prior Level of Function : Needs assist                        OT Problem List: Decreased activity tolerance         OT Goals(Current goals can be found in the care plan section) Acute Rehab OT Goals Patient Stated Goal: to go home OT Goal Formulation: With patient Time For Goal Achievement: 07/09/23 Potential to Achieve Goals: Good   AM-PAC OT "6 Clicks" Daily Activity     Outcome Measure Help from another person eating meals?: None Help from another person taking care of personal grooming?: A Little Help from another person  toileting, which includes using toliet, bedpan, or urinal?: None Help from another person bathing (including washing, rinsing, drying)?: A Lot Help from another person to put on and taking off regular upper body clothing?: A Little Help from another person to put on and taking off regular lower body clothing?: A Lot 6 Click Score: 18   End of Session Equipment Utilized During Treatment: Rollator (4 wheels)  Activity Tolerance: Patient tolerated treatment well Patient left: in chair;with call bell/phone within reach;with chair alarm set  OT Visit Diagnosis: Unsteadiness on feet (R26.81)                Time: 7253-6644 OT Time Calculation (min): 19 min Charges:  OT General Charges $OT Visit: 1 Visit OT Evaluation $OT Eval Low Complexity: 1 Low  Kathie Dike, M.S. OTR/L  07/09/23, 9:14 AM  ascom [phone]

## 2023-07-09 NOTE — Progress Notes (Signed)
PROGRESS NOTE    Alicia Rogers  Alicia Rogers:[id number] DOB: 17-Mar-1961 DOA: 07/07/2023 PCP: Norval Morton, MD     Brief Narrative:   From admission h and p  Alicia Rogers is a 62 y.o. 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    [name] states she is in her usual state of health and is not having any discomfort at this time.  She denies any shortness of breath, nausea, vomiting, palpitations.  She notes that she is here for a procedure due to a nonhealing ulcer of her right foot.  She denies any associated pain at this time.  She has been eating and drinking normally.  Assessment & Plan:   Principal Problem:   Hyperkalemia Active Problems:   Chronic kidney disease (CKD), stage V (HCC)   Atherosclerosis of artery of extremity with ulceration (HCC)   Type 2 diabetes mellitus with diabetic chronic kidney disease (HCC)   Schizoaffective disorder, depressive type (HCC)   Essential hypertension   Hypertensive urgency   COPD (chronic obstructive pulmonary disease) (HCC)  # Hyperkalemia 2/2 advancing ckd5. Resolved with lokelma, though up today, for some reason my orders for lokelma were discontinued yesterday - resume lokelma, nephrology advises maintaining daily, close f/u with them  # CKD5 With hyperkalemia  - nephrology consulted, advises continuing lokelma, close outpt f/u  # Heel ulcer # PAD Vascular following, angiogram with atherectomy of right SFA and above-knee popliteal artery, angioplasty, and placement of right SFA stent, with vascular on 12/6 - outpt vascular f/u  # Heel decubitus ulcer # Left thigh decubitus ulcer Stage 3 on the hip, see photos in media tab - wound care consulted  # T2DM Glucose  appropriate - SSI - stop home farxiga given kidney function, hyperkalemia  # Schizoaffective disorder No behavioral disturbance - cont home wellbutrin, citalopram, lasix  # Anemia of ckd Hgb 8s, stable - trend  # HTN BPs normalized today - cont home amlodipine, clonidine, hydral, imdur - hold losartan given hyperkalemia  # Chronic pain - cont home gabapentin  # COPD Quiescent - home inhalers  # Debility Ambulates with walker at baseline, mostly bedbound, currently resides at snf - [facility] can't take her back today but can tomorrow    DVT prophylaxis: heparin Code Status: full Family Communication: none at bedside.    Level of care: Telemetry Medical Status is: Observation     Consultants:  Vascular, nephrology  Procedures: See above  Antimicrobials:  none    Subjective: No pain, tolerating diet, breathing stable  Objective: Vitals:   07/09/23 0744 07/09/23 0744 07/09/23 0809 07/09/23 1203  BP:  (!) 141/57  107/63  Pulse:  81 78 74  Resp:  20 20 20   Temp: 98.1 F (36.7 C) 98.1 F (36.7 C)  97.7 F (36.5 C)  TempSrc: Axillary     SpO2:  99% 96% 100%  Weight:      Height:        Intake/Output Summary (Last 24 hours) at 07/09/2023 1445 Last data filed at 07/09/2023 1205 Gross per 24 hour  Intake 1400 ml  Output --  Net 1400 ml   Filed Weights   07/07/23 1259  Weight: 51.4 kg    Examination:  General exam: Appears calm and comfortable, chronically ill  appearing Respiratory system: Clear to auscultation. Respiratory effort normal. Cardiovascular system: S1 & S2 heard, RRR. Soft systolic murmur Gastrointestinal system: Abdomen is nondistended, soft and nontender. No organomegaly or masses felt. Normal bowel sounds heard. Central nervous system: Alert and oriented. No focal neurological deficits. Extremities: decreased muscle tone.  Skin:       Psychiatry: Judgement and insight appear normal. Mood & affect appropriate.      Data Reviewed: I have personally reviewed following labs and imaging studies  CBC: Recent Labs  Lab 07/06/23 0951 07/09/23 0632  WBC 9.5 15.6*  NEUTROABS 6.6  --   HGB 8.6* 8.4*  HCT 29.0* 27.0*  MCV 97.0 94.1  PLT 430* 359   Basic Metabolic Panel: Recent Labs  Lab 07/06/23 0951 07/07/23 1343 07/07/23 1557 07/08/23 0420 07/09/23 0632  NA 139  --  139 141 139  K 5.9* 6.2* 5.8* 4.7 5.3*  CL 113*  --  113* 115* 113*  CO2 18*  --  19* 18* 19*  GLUCOSE 85  --  82 106* 95  BUN 61*  --  54* 53* 57*  CREATININE 2.68*  --  2.81* 2.75* 3.10*  CALCIUM 9.4  --  9.6 9.4 9.2   GFR: Estimated Creatinine Clearance: 14.7 mL/min (A) (by C-G formula based on SCr of 3.1 mg/dL (H)). Liver Function Tests: No results for input(s): "AST", "ALT", "ALKPHOS", "BILITOT", "PROT", "ALBUMIN" in the last 168 hours. No results for input(s): "LIPASE", "AMYLASE" in the last 168 hours. No results for input(s): "AMMONIA" in the last 168 hours. Coagulation Profile: No results for input(s): "INR", "PROTIME" in the last 168 hours. Cardiac Enzymes: No results for input(s): "CKTOTAL", "CKMB", "CKMBINDEX", "TROPONINI" in the last 168 hours. BNP (last 3 results) No results for input(s): "PROBNP" in the last 8760 hours. HbA1C: No results for input(s): "HGBA1C" in the last 72 hours. CBG: Recent Labs  Lab 07/08/23 1039 07/08/23 1541 07/08/23 2104 07/09/23 0741 07/09/23 1200  GLUCAP 117* 204* 168* 96 157*   Lipid Profile: No results for input(s): "CHOL", "HDL", "LDLCALC", "TRIG", "CHOLHDL", "LDLDIRECT" in the last 72 hours. Thyroid Function Tests: No results for input(s): "TSH", "T4TOTAL", "FREET4", "T3FREE", "THYROIDAB" in the last 72 hours. Anemia Panel: No results for input(s): "VITAMINB12", "FOLATE", "FERRITIN", "TIBC", "IRON", "RETICCTPCT" in the last 72 hours. Urine analysis:    Component Value Date/Time   COLORURINE YELLOW (A) 05/07/2023 2308   APPEARANCEUR CLEAR (A) 05/07/2023 2308    APPEARANCEUR Cloudy (A) 03/11/2020 1629   LABSPEC 1.008 05/07/2023 2308   PHURINE 7.0 05/07/2023 2308   GLUCOSEU >=500 (A) 05/07/2023 2308   HGBUR NEGATIVE 05/07/2023 2308   BILIRUBINUR NEGATIVE 05/07/2023 2308   BILIRUBINUR Negative 03/11/2020 1629   KETONESUR NEGATIVE 05/07/2023 2308   PROTEINUR >=300 (A) 05/07/2023 2308   NITRITE NEGATIVE 05/07/2023 2308   LEUKOCYTESUR TRACE (A) 05/07/2023 2308   Sepsis Labs: @LABRCNTIP (procalcitonin:4,lacticidven:4)  )No results found for this or any previous visit (from the past 240 hour(s)).       Radiology Studies: PERIPHERAL VASCULAR CATHETERIZATION  Result Date: 07/08/2023 See surgical note for result.       Scheduled Meds:  (feeding supplement) PROSource Plus  30 mL Oral BID BM   allopurinol  100 mg Oral Daily   amLODipine  10 mg Oral Daily   vitamin C  500 mg Oral BID   budesonide (PULMICORT) nebulizer solution  0.25 mg Nebulization BID   buPROPion  100 mg Oral Daily   carbamide peroxide  5 drop Both EARS  BID   cloNIDine  0.1 mg Oral TID   collagenase   Topical Daily   escitalopram  20 mg Oral Daily   feeding supplement (NEPRO CARB STEADY)  237 mL Oral TID BM   ferrous sulfate  325 mg Oral Q breakfast   furosemide  20 mg Oral QODAY   gabapentin  100 mg Oral TID   heparin  5,000 Units Subcutaneous Q8H   hydrALAZINE  50 mg Oral TID   insulin aspart  0-6 Units Subcutaneous TID WC   isosorbide mononitrate  60 mg Oral QHS   losartan  100 mg Oral Daily   multivitamin  1 tablet Oral QHS   pantoprazole  40 mg Oral Daily   sodium chloride flush  3 mL Intravenous Q12H   sodium zirconium cyclosilicate  10 g Oral Daily   umeclidinium-vilanterol  1 puff Inhalation Daily   zinc sulfate (50mg  elemental zinc)  220 mg Oral Daily   Continuous Infusions:     LOS: 0 days     Silvano Bilis, MD [facility]   If 7PM-7AM, please contact night-coverage [email or web address] Password [email or web address] 07/09/2023, 2:45 PM

## 2023-07-09 NOTE — Plan of Care (Signed)
  Problem: Activity: Goal: Risk for activity intolerance will decrease Outcome: Progressing   Problem: Nutrition: Goal: Adequate nutrition will be maintained Outcome: Progressing   Problem: Coping: Goal: Level of anxiety will decrease Outcome: Not Progressing   Problem: Elimination: Goal: Will not experience complications related to bowel motility Outcome: Progressing

## 2023-07-09 NOTE — NC FL2 (Signed)
[place] MEDICAID FL2 LEVEL OF CARE FORM     IDENTIFICATION  Patient Name: Alicia Rogers Birthdate: 1960/10/14 Sex: female Admission Date (Current Location): 07/07/2023  [employer] [facility] [place] Number:  [address] [id number] Facility and Address:  [facility], [address] [place], [place] [address]      Provider Number: [id number]  Attending Physician Name and Address:  Kathrynn Running, MD  Relative Name and Phone Number:  binti, mollet (Sister)  [phone] [facility])    Current Level of Care: Hospital Recommended Level of Care: Skilled Nursing Facility Prior Approval Number: [id number]  Date Approved/Denied:   PASRR Number: New PASRR pending  Discharge Plan: SNF    Current Diagnoses: Patient Active Problem List   Diagnosis Date Noted   Hyperkalemia 07/07/2023   Atherosclerosis of artery of extremity with ulceration (HCC) 07/07/2023   Malnutrition of moderate degree 05/12/2023   Pressure injury of hip, unstageable (HCC) 05/09/2023   Acute urinary retention 05/08/2023   Bilateral lower extremity edema 05/08/2023   [name] (acute kidney injury) (HCC) 05/08/2023   Anasarca 05/07/2023   Hypoxia 05/07/2023   Chronic kidney disease (CKD), stage V (HCC) 05/07/2023   Leukocytosis 05/07/2023   Bilateral pleural effusion 05/07/2023   Thyroid nodule 03/09/2023   Postoperative examination 11/03/2022   Acute-on-chronic kidney injury (HCC) 09/30/2022   Postoperative ileus (HCC) 09/30/2022   Postoperative intra-abdominal abscess (HCC) 09/30/2022   COPD (chronic obstructive pulmonary disease) (HCC) 09/03/2022   Vitamin D deficiency, unspecified 09/03/2022   Allergic rhinitis 09/03/2022   Tobacco abuse counseling 09/03/2022   Pneumonia due to respiratory syncytial virus (RSV) 08/30/2022   Pulmonary nodule 08/30/2022   Iron deficiency anemia 07/21/2022   Gastric erosion 07/21/2022   Polyp of duodenum 07/21/2022   Adenomatous polyp of descending colon  07/21/2022   Dysuria 06/30/2022   MGUS (monoclonal gammopathy of unknown significance) 02/05/2022   Stage 3b chronic kidney disease (HCC) 12/22/2021   Paranoid schizophrenia (HCC) 11/25/2021   Proteinuria, unspecified 11/25/2021   Symptomatic anemia 11/25/2021   Schizoaffective disorder, bipolar type (HCC) 09/29/2021   Hypertensive urgency 09/28/2021   Sinus bradycardia 09/27/2021   Anemia in chronic kidney disease (CKD) 09/27/2021   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   Uterine mass 03/03/2021   Essential hypertension 02/28/2020   Back pain 02/28/2020   Hematuria 02/28/2020   Irritant contact dermatitis due to other agents 03/08/2018   Increased frequency of urination 09/21/2017   Chronic kidney disease, stage 4 (severe) (HCC) 08/26/2017   Low back pain 05/26/2017   Extrapyramidal symptom 03/12/2016   Tobacco use disorder 10/11/2014   Mixed Hyperlipidemia 07/04/2014   Claudication (HCC) 06/09/2014   Schizoaffective disorder, depressive type (HCC) 06/09/2014   Diabetes mellitus, type 2 (HCC) 06/09/2014   Type 2 diabetes mellitus with diabetic chronic kidney disease (HCC) 06/09/2014    Orientation RESPIRATION BLADDER Height & Weight     Self, Time, Situation, Place  Normal  (HD oliguria) Weight: 51.4 kg Height:  5\' 2"  (157.5 cm)  BEHAVIORAL SYMPTOMS/MOOD NEUROLOGICAL BOWEL NUTRITION STATUS       (Frequent stools noted) Diet  AMBULATORY STATUS COMMUNICATION OF NEEDS Skin   Limited Assist Verbally (Delayed responses) PU Stage and Appropriate Care                       Personal Care Assistance Level of Assistance              Functional Limitations Info  SPECIAL CARE FACTORS FREQUENCY                       Contractures Contractures Info: Not present    Additional Factors Info  Code Status, Allergies Code Status Info: Full Code Allergies Info: Penicillins, Lisinopril           Current Medications (07/09/2023):  This is the current hospital active  medication list Current Facility-Administered Medications  Medication Dose Route Frequency Provider Last Rate Last Admin   (feeding supplement) PROSource Plus liquid 30 mL  30 mL Oral BID BM Wouk, Wilfred Curtis, MD   30 mL at 07/09/23 0932   acetaminophen (TYLENOL) tablet 650 mg  650 mg Oral Q6H PRN Annice Needy, MD   650 mg at 07/09/23 1319   Or   acetaminophen (TYLENOL) suppository 650 mg  650 mg Rectal Q6H PRN Annice Needy, MD       allopurinol (ZYLOPRIM) tablet 100 mg  100 mg Oral Daily Annice Needy, MD   100 mg at 07/09/23 0932   amLODipine (NORVASC) tablet 10 mg  10 mg Oral Daily Annice Needy, MD   10 mg at 07/09/23 1610   ascorbic acid (VITAMIN C) tablet 500 mg  500 mg Oral BID Kathrynn Running, MD   500 mg at 07/09/23 0903   budesonide (PULMICORT) nebulizer solution 0.25 mg  0.25 mg Nebulization BID Annice Needy, MD   0.25 mg at 07/09/23 0809   buPROPion [facility]) tablet 100 mg  100 mg Oral Daily Annice Needy, MD   100 mg at 07/09/23 0905   carbamide peroxide (DEBROX) 6.5 % OTIC (EAR) solution 5 drop  5 drop Both EARS BID Annice Needy, MD   5 drop at 07/09/23 1023   cloNIDine (CATAPRES) tablet 0.1 mg  0.1 mg Oral TID Annice Needy, MD   0.1 mg at 07/09/23 9604   collagenase (SANTYL) ointment   Topical Daily Annice Needy, MD   Given at 07/08/23 1646   cyclobenzaprine (FLEXERIL) tablet 10 mg  10 mg Oral QHS PRN Annice Needy, MD       escitalopram (LEXAPRO) tablet 20 mg  20 mg Oral Daily Annice Needy, MD   20 mg at 07/09/23 0903   feeding supplement (NEPRO CARB STEADY) liquid 237 mL  237 mL Oral TID BM Kathrynn Running, MD   237 mL at 07/09/23 0912   ferrous sulfate tablet 325 mg  325 mg Oral Q breakfast Annice Needy, MD   325 mg at 07/09/23 0903   furosemide (LASIX) tablet 20 mg  20 mg Oral Bettey Mare, MD   20 mg at 07/09/23 0903   gabapentin (NEURONTIN) capsule 100 mg  100 mg Oral TID Annice Needy, MD   100 mg at 07/09/23 0903   heparin injection 5,000 Units  5,000 Units  Subcutaneous Q8H Annice Needy, MD   5,000 Units at 07/08/23 2207   hydrALAZINE (APRESOLINE) tablet 50 mg  50 mg Oral TID Annice Needy, MD   50 mg at 07/09/23 5409   HYDROmorphone (DILAUDID) injection 1 mg  1 mg Intravenous Once PRN Annice Needy, MD       insulin aspart (novoLOG) injection 0-6 Units  0-6 Units Subcutaneous TID WC Annice Needy, MD   1 Units at 07/09/23 1306   isosorbide mononitrate (IMDUR) 24 hr tablet 60 mg  60 mg Oral QHS Annice Needy,  MD   60 mg at 07/08/23 2207   multivitamin (RENA-VIT) tablet 1 tablet  1 tablet Oral QHS Kathrynn Running, MD   1 tablet at 07/08/23 2207   ondansetron (ZOFRAN) injection 4 mg  4 mg Intravenous Q6H PRN Annice Needy, MD       pantoprazole (PROTONIX) EC tablet 40 mg  40 mg Oral Daily Annice Needy, MD   40 mg at 07/09/23 0902   sodium chloride flush (NS) 0.9 % injection 3 mL  3 mL Intravenous Q12H Annice Needy, MD   3 mL at 07/09/23 0916   sodium zirconium cyclosilicate (LOKELMA) packet 10 g  10 g Oral Daily Wendee Beavers, NP   10 g at 07/09/23 0906   umeclidinium-vilanterol (ANORO ELLIPTA) 62.5-25 MCG/ACT 1 puff  1 puff Inhalation Daily Annice Needy, MD   1 puff at 07/09/23 1314   zinc sulfate (50mg  elemental zinc) capsule 220 mg  220 mg Oral Daily Kathrynn Running, MD   220 mg at 07/09/23 8119     Discharge Medications: Please see discharge summary for a list of discharge medications.  Relevant Imaging Results:  Relevant Lab Results:   Additional Information SS [id number]  Bing Quarry, RN

## 2023-07-09 NOTE — Discharge Summary (Signed)
Alicia Rogers EPP:[id number] DOB: 25-Aug-1960 DOA: 07/07/2023  PCP: Norval Morton, MD  Admit date: 07/07/2023 Discharge date: 07/10/2023  Time spent: 35 minutes  Recommendations for Outpatient Follow-up:  Close f/u with nephrology (need to call and schedule)  F/u vascular surgery 12/12 for placement of AV dialysis graft (recommend contacting vascular surgery if any questions about time/place of this procedure)    Discharge Diagnoses:  Principal Problem:   Hyperkalemia Active Problems:   Chronic kidney disease (CKD), stage V (HCC)   Atherosclerosis of artery of extremity with ulceration (HCC)   Type 2 diabetes mellitus with diabetic chronic kidney disease (HCC)   Schizoaffective disorder, depressive type (HCC)   Essential hypertension   Hypertensive urgency   COPD (chronic obstructive pulmonary disease) (HCC)   Discharge Condition: stable  Diet recommendation: heart healthy low sodium  Filed Weights   07/07/23 1259  Weight: 51.4 kg    History of present illness:  From admission h and p Alicia Rogers is a 62 y.o. 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    Mrs. Dollins states she is in her usual state of health and is not having any discomfort at this time.  She denies any shortness of breath, nausea, vomiting, palpitations.  She notes that she is here for a procedure due to a nonhealing ulcer of her right foot.  She denies any associated pain at this time.  She has been eating and drinking normally.  Hospital Course:  Patient presented for vascular procedure. Pre-op labs showed hyperkalemia so procedure was deferred and the patient sent to the ER. She has progressive ckd stage 5. She was treated with lokelma and her  hyperkalemia resolved. Vascular performed angiogram with atherectomy, angioplasty, and SFA stent placement on hospital day 1. She was seen by nephrology who advises continuing lokelma and following up shortly. Will have surgery with vascular this coming week to place AV graft for initiation of hemodialysis.  Procedures: See above   Consultations: Vascular, nephrology  Discharge Exam: Vitals:   07/10/23 0809 07/10/23 1204  BP: (!) 156/60 (!) 133/59  Pulse: 87 73  Resp:    Temp: 99 F (37.2 C) 98.7 F (37.1 C)  SpO2: 100% 100%    General: MAD Cardiovascular: RRR, systolic murmur Respiratory: CTAB, rales at bases Abdomen: soft  Discharge Instructions   Discharge Instructions     Diet - low sodium heart healthy   Complete by: As directed    Discharge wound care:   Complete by: As directed    Wound care  Daily      Comments: Paint left and right heel ulcers with betadine, allow to air dry.  Offload with Prevalon boots  07/08/23 0916    07/09/23 0500    Wound care  Daily      Comments: Cleanse left hip wound with saline, pat dry. Impregnate 4x4 gauze with Santyl and place on/over wound bed, top with dry dressing. Secure with tape. Change daily   Increase activity slowly   Complete by: As directed       Allergies as of 07/10/2023       Reactions   Penicillins    Rash   Lisinopril Other (See Comments)   Cough         Medication List  STOP taking these medications    cefadroxil 500 MG/5ML suspension Commonly known as: DURICEF   dapagliflozin propanediol 5 MG Tabs tablet Commonly known as: FARXIGA   losartan 100 MG tablet Commonly known as: COZAAR       TAKE these medications    allopurinol 100 MG tablet Commonly known as: ZYLOPRIM Take 100 mg by mouth daily.   amLODipine 10 MG tablet Commonly known as: NORVASC Take 1 tablet (10 mg total) by mouth daily.   aspirin EC 81 MG tablet Take 81 mg by mouth daily.   Breztri Aerosphere 160-9-4.8  MCG/ACT Aero Generic drug: Budeson-Glycopyrrol-Formoterol Inhale 2 puffs into the lungs in the morning and at bedtime. Start taking after antibiotics are completed.   buPROPion 100 MG tablet Commonly known as: WELLBUTRIN Take 100 mg by mouth daily.   carbamide peroxide 6.5 % OTIC solution Commonly known as: DEBROX 5 drops 2 (two) times daily.   cloNIDine 0.1 MG tablet Commonly known as: CATAPRES Take 1 tablet (0.1 mg total) by mouth 3 (three) times daily.   collagenase 250 UNIT/GM ointment Commonly known as: SANTYL Apply topically daily.   cyclobenzaprine 10 MG tablet Commonly known as: FLEXERIL Take 1 tablet (10 mg total) by mouth at bedtime. What changed:  when to take this reasons to take this   ergocalciferol 1.25 MG (50000 UT) capsule Commonly known as: VITAMIN D2 Take 50,000 Units by mouth once a week. On Fridays   escitalopram 20 MG tablet Commonly known as: LEXAPRO Take 20 mg by mouth daily.   feeding supplement Liqd Take 237 mLs by mouth 2 (two) times daily between meals.   ferrous sulfate 325 (65 FE) MG tablet Take 325 mg by mouth 3 (three) times daily with meals.   fluticasone 50 MCG/ACT nasal spray Commonly known as: FLONASE Place 2 sprays into both nostrils daily.   furosemide 20 MG tablet Commonly known as: LASIX Take 1 tablet (20 mg total) by mouth every other day.   gabapentin 100 MG capsule Commonly known as: NEURONTIN Take 100 mg by mouth 3 (three) times daily.   hydrALAZINE 50 MG tablet Commonly known as: APRESOLINE Take 1 tablet (50 mg total) by mouth 3 (three) times daily.   isosorbide mononitrate 60 MG 24 hr tablet Commonly known as: IMDUR Take 1 tablet (60 mg total) by mouth at bedtime. Hold if SBP <130   linagliptin 5 MG Tabs tablet Commonly known as: TRADJENTA Take 5 mg by mouth daily.   lip balm Oint Apply 1 Application topically as needed for lip care.   loperamide 2 MG capsule Commonly known as: IMODIUM Take 2 mg by  mouth 2 (two) times daily as needed for diarrhea or loose stools.   melatonin 3 MG Tabs tablet Take 2 tablets (6 mg total) by mouth at bedtime as needed.   multivitamin capsule Take 1 capsule by mouth daily.   nystatin 100000 UNIT/ML suspension Commonly known as: MYCOSTATIN Take by mouth.   omeprazole 40 MG capsule Commonly known as: PRILOSEC Take 40 mg by mouth daily.   Paliperidone ER injection Commonly known as: INVEGA SUSTENNA Inject 78 mg into the muscle every 30 (thirty) days.   polyethylene glycol powder 17 GM/SCOOP powder Commonly known as: GLYCOLAX/MIRALAX Take 17 g by mouth daily as needed.   Polyox WSR-301 Powd SMARTSIG:17 Gram(s) By Mouth Daily PRN   Sodium Hypochlorite 0.057 % Gel Apply topically.   sodium zirconium cyclosilicate 10 g Pack packet Commonly known as: LOKELMA Take 10 g by mouth  daily.   Tylenol 325 MG tablet Generic drug: acetaminophen Take 650 mg by mouth every 8 (eight) hours as needed for mild pain. *DO NOT EXCEED 4GM OF TYLENOL IN 24 HOURS*   Zinc Sulfate 220 (50 Zn) MG Tabs Take by mouth.               Discharge Care Instructions  (From admission, onward)           Start     Ordered   07/10/23 0000  Discharge wound care:       Comments: Wound care  Daily      Comments: Paint left and right heel ulcers with betadine, allow to air dry.  Offload with Prevalon boots  07/08/23 0916    07/09/23 0500    Wound care  Daily      Comments: Cleanse left hip wound with saline, pat dry. Impregnate 4x4 gauze with Santyl and place on/over wound bed, top with dry dressing. Secure with tape. Change daily   07/10/23 1228           Allergies  Allergen Reactions   Penicillins     Rash   Lisinopril Other (See Comments)    Cough       The results of significant diagnostics from this hospitalization (including imaging, microbiology, ancillary and laboratory) are listed below for reference.    Significant Diagnostic  Studies: PERIPHERAL VASCULAR CATHETERIZATION  Result Date: 07/08/2023 See surgical note for result.  VAS [place] UPPER EXT VEIN MAPPING (PRE-OP AVF)  Result Date: 06/28/2023 UPPER EXTREMITY VEIN MAPPING Patient Name:  CAMIE KNABLE  Date of Exam:   06/24/2023 Medical Rec #: [id number]           Accession #:    [id number] Date of Birth: [birth year]            Patient Gender: F Patient Age:   80 years Exam Location:  [facility] Procedure:      VAS [place] UPPER EXT VEIN MAPPING (PRE-OP AVF) Referring Phys: Sheppard Plumber --------------------------------------------------------------------------------  Indications: Pre-access. History: Ckd3.  Performing Technologist: Salvadore Farber RVT  Examination Guidelines: A complete evaluation includes B-mode imaging, spectral Doppler, color Doppler, and power Doppler as needed of all accessible portions of each vessel. Bilateral testing is considered an integral part of a complete examination. Limited examinations for reoccurring indications may be performed as noted. +-----------------+-------------+----------+--------+ Right Cephalic   Diameter (cm)Depth (cm)Findings +-----------------+-------------+----------+--------+ Prox upper arm       0.22                        +-----------------+-------------+----------+--------+ Mid upper arm        0.19                        +-----------------+-------------+----------+--------+ Dist upper arm       0.19                        +-----------------+-------------+----------+--------+ Antecubital fossa    0.18                        +-----------------+-------------+----------+--------+ Prox forearm         0.17                        +-----------------+-------------+----------+--------+ Mid forearm          0.18                        +-----------------+-------------+----------+--------+  Dist forearm         0.12                        +-----------------+-------------+----------+--------+  +-----------------+-------------+----------+--------+ Right Basilic    Diameter (cm)Depth (cm)Findings +-----------------+-------------+----------+--------+ Prox upper arm       0.21                        +-----------------+-------------+----------+--------+ Mid upper arm        0.24                        +-----------------+-------------+----------+--------+ Dist upper arm       0.24                        +-----------------+-------------+----------+--------+ Antecubital fossa    0.23                        +-----------------+-------------+----------+--------+ Prox forearm         0.21                        +-----------------+-------------+----------+--------+ +-----------------+-------------+----------+--------+ Left Cephalic    Diameter (cm)Depth (cm)Findings +-----------------+-------------+----------+--------+ Prox upper arm       0.26                        +-----------------+-------------+----------+--------+ Mid upper arm        0.20                        +-----------------+-------------+----------+--------+ Dist upper arm       0.24                        +-----------------+-------------+----------+--------+ Antecubital fossa    0.25                        +-----------------+-------------+----------+--------+ Prox forearm         0.29                        +-----------------+-------------+----------+--------+ Mid forearm          0.18                        +-----------------+-------------+----------+--------+ Dist forearm         0.13                        +-----------------+-------------+----------+--------+ +-----------------+-------------+----------+--------+ Left Basilic     Diameter (cm)Depth (cm)Findings +-----------------+-------------+----------+--------+ Prox upper arm       0.37                        +-----------------+-------------+----------+--------+ Mid upper arm        0.33                         +-----------------+-------------+----------+--------+ Dist upper arm       0.21                        +-----------------+-------------+----------+--------+ Antecubital fossa    0.21                        +-----------------+-------------+----------+--------+  Prox forearm         0.19                        +-----------------+-------------+----------+--------+ Summary: Right: Normal compressible veins. Good caliber basilic vein. Small        caliber cephalic vein. Left: Normal compressible veins. Good caliber basilic vein. Good       caliber cephalic vein above elbow. Small caliber cephalic vein       in mid to distal forearm. *See table(s) above for measurements and observations.  Diagnosing physician: Festus Barren MD Electronically signed by Festus Barren MD on 06/28/2023 at 7:23:18 AM.    Final    VAS [place] UPPER EXTREMITY ARTERIAL DUPLEX  Result Date: 06/28/2023  UPPER EXTREMITY DUPLEX STUDY Patient Name:  CLINTONIA CARTAYA  Date of Exam:   06/24/2023 Medical Rec #: [id number]           Accession #:    [id number] Date of Birth: [birth year]            Patient Gender: F Patient Age:   34 years Exam Location:  [facility] Procedure:      VAS [place] UPPER EXTREMITY ARTERIAL DUPLEX Referring Phys: Sheppard Plumber --------------------------------------------------------------------------------  Indications: Ckd3 pre access.  Performing Technologist: Salvadore Farber RVT  Examination Guidelines: A complete evaluation includes B-mode imaging, spectral Doppler, color Doppler, and power Doppler as needed of all accessible portions of each vessel. Bilateral testing is considered an integral part of a complete examination. Limited examinations for reoccurring indications may be performed as noted.  Right Pre-Dialysis Findings: +-----------------------+----------+--------------------+--------+--------+ Location               PSV (cm/s)Intralum. Diam. (cm)WaveformComments  +-----------------------+----------+--------------------+--------+--------+ Brachial Antecub. fossa109       0.46                                 +-----------------------+----------+--------------------+--------+--------+ Radial Art at Wrist    77        0.30                                 +-----------------------+----------+--------------------+--------+--------+ Ulnar Art at Wrist     44        0.15                                 +-----------------------+----------+--------------------+--------+--------+  Left Pre-Dialysis Findings: +-----------------------+----------+--------------------+--------+--------+ Location               PSV (cm/s)Intralum. Diam. (cm)WaveformComments +-----------------------+----------+--------------------+--------+--------+ Brachial Antecub. fossa103       0.47                                 +-----------------------+----------+--------------------+--------+--------+ Radial Art at Wrist    82        0.22                                 +-----------------------+----------+--------------------+--------+--------+ Ulnar Art at Wrist     56        0.19                                 +-----------------------+----------+--------------------+--------+--------+  Summary:  Right: No obstruction visualized in the right upper extremity. Left: No obstruction visualized in the left upper extremity. *See table(s) above for measurements and observations. Electronically signed by Festus Barren MD on 06/28/2023 at 7:23:12 AM.    Final    VAS [place] ABI WITH/WO TBI  Result Date: 06/28/2023  LOWER EXTREMITY DOPPLER STUDY Patient Name:  CANDIE BUSAM  Date of Exam:   06/24/2023 Medical Rec #: [id number]           Accession #:    [id number] Date of Birth: 01/25/1961            Patient Gender: F Patient Age:   79 years Exam Location:  [facility] Procedure:      VAS [place] ABI WITH/WO TBI Referring Phys:  --------------------------------------------------------------------------------  Indications: Ulceration, and peripheral artery disease.  Performing Technologist: Salvadore Farber RVT  Examination Guidelines: A complete evaluation includes at minimum, Doppler waveform signals and systolic blood pressure reading at the level of bilateral brachial, anterior tibial, and posterior tibial arteries, when vessel segments are accessible. Bilateral testing is considered an integral part of a complete examination. Photoelectric Plethysmograph (PPG) waveforms and toe systolic pressure readings are included as required and additional duplex testing as needed. Limited examinations for reoccurring indications may be performed as noted.  ABI Findings: +---------+------------------+-----+----------+--------+ Right    Rt Pressure (mmHg)IndexWaveform  Comment  +---------+------------------+-----+----------+--------+ Brachial 114                                       +---------+------------------+-----+----------+--------+ ATA      64                0.56 monophasic         +---------+------------------+-----+----------+--------+ PTA      58                0.51 monophasic         +---------+------------------+-----+----------+--------+ Great Toe57                0.50 Abnormal           +---------+------------------+-----+----------+--------+ +---------+------------------+-----+--------+-------+ Left     Lt Pressure (mmHg)IndexWaveformComment +---------+------------------+-----+--------+-------+ Brachial 110                                    +---------+------------------+-----+--------+-------+ ATA      91                0.80 biphasic        +---------+------------------+-----+--------+-------+ PTA      93                0.82 biphasic        +---------+------------------+-----+--------+-------+ Great Toe80                0.70 Normal           +---------+------------------+-----+--------+-------+  Summary: Right: Resting right ankle-brachial index indicates moderate right lower extremity arterial disease. The right toe-brachial index is abnormal. Left: Resting left ankle-brachial index indicates mild left lower extremity arterial disease. The left toe-brachial index is normal. *See table(s) above for measurements and observations.  Electronically signed by Festus Barren MD on 06/28/2023 at 7:23:05 AM.    Final     Microbiology: No results found for this or any previous visit (from the past 240 hour(s)).  Labs: Basic Metabolic Panel: Recent Labs  Lab 07/06/23 0951 07/07/23 1343 07/07/23 1557 07/08/23 0420 07/09/23 0632 07/10/23 0424  NA 139  --  139 141 139 135  K 5.9* 6.2* 5.8* 4.7 5.3* 4.9  CL 113*  --  113* 115* 113* 107  CO2 18*  --  19* 18* 19* 18*  GLUCOSE 85  --  82 106* 95 119*  BUN 61*  --  54* 53* 57* 72*  CREATININE 2.68*  --  2.81* 2.75* 3.10* 3.42*  CALCIUM 9.4  --  9.6 9.4 9.2 9.0   Liver Function Tests: No results for input(s): "AST", "ALT", "ALKPHOS", "BILITOT", "PROT", "ALBUMIN" in the last 168 hours. No results for input(s): "LIPASE", "AMYLASE" in the last 168 hours. No results for input(s): "AMMONIA" in the last 168 hours. CBC: Recent Labs  Lab 07/06/23 0951 07/09/23 0632  WBC 9.5 15.6*  NEUTROABS 6.6  --   HGB 8.6* 8.4*  HCT 29.0* 27.0*  MCV 97.0 94.1  PLT 430* 359   Cardiac Enzymes: No results for input(s): "CKTOTAL", "CKMB", "CKMBINDEX", "TROPONINI" in the last 168 hours. BNP: BNP (last 3 results) Recent Labs    05/07/23 1619  BNP 441.2*    ProBNP (last 3 results) No results for input(s): "PROBNP" in the last 8760 hours.  CBG: Recent Labs  Lab 07/09/23 1200 07/09/23 1647 07/10/23 0344 07/10/23 0809 07/10/23 1204  GLUCAP 157* 140* 134* 126* 160*       Signed:  Silvano Bilis MD.  [facility] 07/10/2023, 12:29 PM

## 2023-07-09 NOTE — Evaluation (Signed)
Physical Therapy Evaluation Patient Details Name: Alicia Rogers MRN: [id number] DOB: [birth year] Today's Date: 07/09/2023  History of Present Illness  Alicia Rogers is a 62 y.o. 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  Clinical Impression  62 yo Female from [facility] reports functioning at baseline. She does report falling in the bathroom approximately 1 week ago, no reported injuries; She states this was because she didn't have her rollator. Patient reports needing physical assistance for bathing/dressing which she receives at [facility]. She demonstrates generalized weakness in BLE (grossly 4/5). She was able to transfer sit<>Stand with rollator demonstrating good walker safety (locking wheels prior to standing) and was able to walk x20 feet with supervision. Patient ambulates with rollator with reciprocal gait pattern, flexed posture and slower gait speed. She reports her walking pattern is at baseline. Patient expressed desire to return to LTC facility and reports no concerns regarding her mobility upon return. She is currently functioning at baseline and therefore does not demonstrate need for additional skilled PT services.         If plan is discharge home, recommend the following: A little help with walking and/or transfers;A little help with bathing/dressing/bathroom;Assistance with cooking/housework;Direct supervision/assist for financial management;Assist for transportation;Direct supervision/assist for medications management   Can travel by private vehicle        Equipment Recommendations None recommended by PT  Recommendations for Other Services       Functional Status Assessment Patient has not had a recent decline in their functional  status     Precautions / Restrictions Precautions Precautions: Fall Restrictions Weight Bearing Restrictions: No      Mobility  Bed Mobility               General bed mobility comments: not observed; patient sitting in chair during PT assessment    Transfers Overall transfer level: Needs assistance Equipment used: Rollator (4 wheels) Transfers: Sit to/from Stand Sit to Stand: Supervision           General transfer comment: pt does demonstrate good safety awareness, locking rollator before sit to stand transfer; requires supervision for balance safety and equipment management;    Ambulation/Gait Ambulation/Gait assistance: Supervision Gait Distance (Feet): 20 Feet Assistive device: Rollator (4 wheels)   Gait velocity: decreased     General Gait Details: ambulates with reciprocal gait pattern, kyphotic, flexed posture; slower gait speed; pt reports her walking is at baseline; no veering noted and good safety awareness with rollator.  Stairs            Wheelchair Mobility     Tilt Bed    Modified Rankin (Stroke Patients Only)       Balance Overall balance assessment: Needs assistance Sitting-balance support: No upper extremity supported, Feet supported Sitting balance-Leahy Scale: Good     Standing balance support: No upper extremity supported, During functional activity Standing balance-Leahy Scale: Fair Standing balance comment: does utilize rollator for balance when walking                             Pertinent Vitals/Pain Pain Assessment Pain Assessment: No/denies pain (no pain at rest; reports hip pain upon standing but states this is chronic)    Home Living Family/patient expects to be discharged  to:: Skilled nursing facility                   Additional Comments: [facility] resident, ambulatory with rollator, 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    Prior Function Prior Level of Function : Needs assist        Physical Assist : Mobility (physical)     Mobility Comments: uses rollator for all ambulation ADLs Comments: Pt reports she needs help for bathing and dressing;     Extremity/Trunk Assessment   Upper Extremity Assessment Upper Extremity Assessment: Defer to OT evaluation    Lower Extremity Assessment Lower Extremity Assessment: Generalized weakness    Cervical / Trunk Assessment Cervical / Trunk Assessment: Kyphotic  Communication   Communication Communication: No apparent difficulties  Cognition Arousal: Alert Behavior During Therapy: Flat affect Overall Cognitive Status: Within Functional Limits for tasks assessed                                          General Comments      Exercises     Assessment/Plan    PT Assessment Patient does not need any further PT services  PT Problem List         PT Treatment Interventions      PT Goals (Current goals can be found in the Care Plan section)  Acute Rehab PT Goals Patient Stated Goal: to return to [facility] PT Goal Formulation: With patient Time For Goal Achievement: 07/09/23 Potential to Achieve Goals: Good    Frequency       Co-evaluation               AM-PAC PT "6 Clicks" Mobility  Outcome Measure Help needed turning from your back to your side while in a flat bed without using bedrails?: None Help needed moving from lying on your back to sitting on the side of a flat bed without using bedrails?: None Help needed moving to and from a bed to a chair (including a wheelchair)?: A Little Help needed standing up from a chair using your arms (e.g., wheelchair or bedside chair)?: None Help needed to walk in hospital room?: A Little Help needed climbing 3-5 steps with a railing? : A Little 6 Click Score: 21    End of Session Equipment Utilized During Treatment: Gait belt Activity Tolerance: Patient tolerated treatment well;No increased pain Patient left: in chair;with call bell/phone  within reach;with chair alarm set Nurse Communication: Mobility status PT Visit Diagnosis: Unsteadiness on feet (R26.81);Muscle weakness (generalized) (M62.81)    Time: 9562-1308 PT Time Calculation (min) (ACUTE ONLY): 8 min   Charges:   PT Evaluation $PT Eval Low Complexity: 1 Low   PT General Charges $$ ACUTE PT VISIT: 1 Visit          Sherika Kubicki PT, DPT 07/09/2023, 1:51 PM

## 2023-07-09 NOTE — Progress Notes (Addendum)
[facility]  ROUNDING NOTE   Subjective:   Kerisha S Natzke is a 62 year old female who presents to the hospital for scheduled angiogram.  Angiogram deferred due to hyperkalemia.   Patient was given lokelma 12/5 Angiogram completed 12/7  Updates: Patient seen in bed resting. No complaints.      Objective:  Vital signs in last 24 hours:  Temp:  [97.7 F (36.5 C)-99.7 F (37.6 C)] 97.7 F (36.5 C) (12/07 1203) Pulse Rate:  [74-97] 74 (12/07 1203) Resp:  [12-20] 20 (12/07 1203) BP: (107-161)/(56-65) 107/63 (12/07 1203) SpO2:  [96 %-100 %] 100 % (12/07 1203)  Weight change:  Filed Weights   07/07/23 1259  Weight: 51.4 kg    Intake/Output: I/O last 3 completed shifts: In: 960 [P.O.:760; IV Piggyback:200] Out: -    Intake/Output this shift:  Total I/O In: 440 [P.O.:440] Out: -   Physical Exam: General: NAD,   Head: Normocephalic, atraumatic. Moist oral mucosal membranes  Eyes: Anicteric, PERRL  Neck: Supple, trachea midline  Lungs:  Clear to auscultation  Heart: Regular rate and rhythm  Abdomen:  Soft, nontender,   Extremities:  No peripheral edema.  Neurologic: Nonfocal, moving all four extremities  Skin: No lesions  Access: Not on HD    Basic Metabolic Panel: Recent Labs  Lab 07/06/23 0951 07/07/23 1343 07/07/23 1557 07/08/23 0420 07/09/23 0632  NA 139  --  139 141 139  K 5.9* 6.2* 5.8* 4.7 5.3*  CL 113*  --  113* 115* 113*  CO2 18*  --  19* 18* 19*  GLUCOSE 85  --  82 106* 95  BUN 61*  --  54* 53* 57*  CREATININE 2.68*  --  2.81* 2.75* 3.10*  CALCIUM 9.4  --  9.6 9.4 9.2    Liver Function Tests: No results for input(s): "AST", "ALT", "ALKPHOS", "BILITOT", "PROT", "ALBUMIN" in the last 168 hours. No results for input(s): "LIPASE", "AMYLASE" in the last 168 hours. No results for input(s): "AMMONIA" in the last 168 hours.  CBC: Recent Labs  Lab 07/06/23 0951 07/09/23 0632  WBC 9.5 15.6*  NEUTROABS 6.6  --   HGB 8.6* 8.4*   HCT 29.0* 27.0*  MCV 97.0 94.1  PLT 430* 359    Cardiac Enzymes: No results for input(s): "CKTOTAL", "CKMB", "CKMBINDEX", "TROPONINI" in the last 168 hours.  BNP: Invalid input(s): "POCBNP"  CBG: Recent Labs  Lab 07/08/23 1039 07/08/23 1541 07/08/23 2104 07/09/23 0741 07/09/23 1200  GLUCAP 117* 204* 168* 96 157*    Microbiology: Results for orders placed or performed during the hospital encounter of 02/13/23  Resp panel by RT-PCR (RSV, Flu A&B, Covid) Anterior Nasal Swab     Status: None   Collection Time: 02/13/23  9:19 AM   Specimen: Anterior Nasal Swab  Result Value Ref Range Status   SARS Coronavirus 2 by RT PCR NEGATIVE NEGATIVE Final    Comment: (NOTE) SARS-CoV-2 target nucleic acids are NOT DETECTED.  The SARS-CoV-2 RNA is generally detectable in upper respiratory specimens during the acute phase of infection. The lowest concentration of SARS-CoV-2 viral copies this assay can detect is 138 copies/mL. A negative result does not preclude SARS-Cov-2 infection and should not be used as the sole basis for treatment or other patient management decisions. A negative result may occur with  improper specimen collection/handling, submission of specimen other than nasopharyngeal swab, presence of viral mutation(s) within the areas targeted by this assay, and inadequate number of viral copies(<138 copies/mL). A negative result  must be combined with clinical observations, patient history, and epidemiological information. The expected result is Negative.  Fact Sheet for Patients:  BloggerCourse.com  Fact Sheet for Healthcare Providers:  SeriousBroker.it  This test is no t yet approved or cleared by the [place] FDA and  has been authorized for detection and/or diagnosis of SARS-CoV-2 by FDA under an Emergency Use Authorization (EUA). This EUA will remain  in effect (meaning this test can be used) for the duration  of the COVID-19 declaration under Section 564(b)(1) of the Act, 21 U.S.C.section 360bbb-3(b)(1), unless the authorization is terminated  or revoked sooner.       Influenza A by PCR NEGATIVE NEGATIVE Final   Influenza B by PCR NEGATIVE NEGATIVE Final    Comment: (NOTE) The Xpert Xpress SARS-CoV-2/FLU/RSV plus assay is intended as an aid in the diagnosis of influenza from Nasopharyngeal swab specimens and should not be used as a sole basis for treatment. Nasal washings and aspirates are unacceptable for Xpert Xpress SARS-CoV-2/FLU/RSV testing.  Fact Sheet for Patients: BloggerCourse.com  Fact Sheet for Healthcare Providers: SeriousBroker.it  This test is not yet approved or cleared by the [place] FDA and has been authorized for detection and/or diagnosis of SARS-CoV-2 by FDA under an Emergency Use Authorization (EUA). This EUA will remain in effect (meaning this test can be used) for the duration of the COVID-19 declaration under Section 564(b)(1) of the Act, 21 U.S.C. section 360bbb-3(b)(1), unless the authorization is terminated or revoked.     Resp Syncytial Virus by PCR NEGATIVE NEGATIVE Final    Comment: (NOTE) Fact Sheet for Patients: BloggerCourse.com  Fact Sheet for Healthcare Providers: SeriousBroker.it  This test is not yet approved or cleared by the [place] FDA and has been authorized for detection and/or diagnosis of SARS-CoV-2 by FDA under an Emergency Use Authorization (EUA). This EUA will remain in effect (meaning this test can be used) for the duration of the COVID-19 declaration under Section 564(b)(1) of the Act, 21 U.S.C. section 360bbb-3(b)(1), unless the authorization is terminated or revoked.  Performed at [facility], [address] [place], [place] [address]     Coagulation Studies: No results for input(s): "LABPROT",  "INR" in the last 72 hours.  Urinalysis: No results for input(s): "COLORURINE", "LABSPEC", "PHURINE", "GLUCOSEU", "HGBUR", "BILIRUBINUR", "KETONESUR", "PROTEINUR", "UROBILINOGEN", "NITRITE", "LEUKOCYTESUR" in the last 72 hours.  Invalid input(s): "APPERANCEUR"    Imaging: PERIPHERAL VASCULAR CATHETERIZATION  Result Date: 07/08/2023 See surgical note for result.    Medications:     (feeding supplement) PROSource Plus  30 mL Oral BID BM   allopurinol  100 mg Oral Daily   amLODipine  10 mg Oral Daily   vitamin C  500 mg Oral BID   budesonide (PULMICORT) nebulizer solution  0.25 mg Nebulization BID   buPROPion  100 mg Oral Daily   carbamide peroxide  5 drop Both EARS BID   cloNIDine  0.1 mg Oral TID   collagenase   Topical Daily   escitalopram  20 mg Oral Daily   feeding supplement (NEPRO CARB STEADY)  237 mL Oral TID BM   ferrous sulfate  325 mg Oral Q breakfast   furosemide  20 mg Oral QODAY   gabapentin  100 mg Oral TID   heparin  5,000 Units Subcutaneous Q8H   hydrALAZINE  50 mg Oral TID   insulin aspart  0-6 Units Subcutaneous TID WC   isosorbide mononitrate  60 mg Oral QHS   losartan  100 mg Oral  Daily   multivitamin  1 tablet Oral QHS   pantoprazole  40 mg Oral Daily   sodium chloride flush  3 mL Intravenous Q12H   sodium zirconium cyclosilicate  10 g Oral Daily   umeclidinium-vilanterol  1 puff Inhalation Daily   zinc sulfate (50mg  elemental zinc)  220 mg Oral Daily   acetaminophen **OR** acetaminophen, cyclobenzaprine, HYDROmorphone (DILAUDID) injection, ondansetron (ZOFRAN) IV  Assessment/ Plan:  Ms. ALBIRTHA STROLLO is a 62 y.o.  female who presents for scheduled angiogram but found to have hyperkalemia.   Hyperkalemia with chronic kidney disease stage IV.  Patient remains at baseline renal function.  Potassium 5.8.  Patient received Lokelma.  Potassium corrected to 4.7. Potassium 5.3 12/7 Recommend Lokelma 10 g daily outpatient with close follow-up in our  office.   2.  Hypertension with chronic kidney disease.  Currently receiving amlodipine, clonidine, furosemide, hydralazine, isosorbide, and losartan,. BP 132/58   3. Diabetes mellitus type II with chronic kidney disease/renal manifestations: noninsulin dependent. Home regimen includes [place]. Most recent hemoglobin A1c is 7.6 on 05/08/2023.     LOS: 0 Cassius Cullinane P Windell Moulding 12/7/20242:49 PM

## 2023-07-09 NOTE — Progress Notes (Signed)
Transition of Care [facility]) - Inpatient Brief Assessment   Patient Details  Name: Alicia Rogers MRN: [id number] Date of Birth: [birth year]  Transition of [facility]) CM/SW Contact:    Bing Quarry, RN Phone Number: 07/09/2023, 2:03 PM   Clinical Narrative: 12/7: Admitted from [facility] for a scheduled angiogram for PAD and was found to be hyperkalemic resulting in admission. No s/p angiogram/vascular service. Medically ready to discharge back to LTC [facility] after PT/OT evaluations indicated patient was a baseline for mobility and other needs were provided at LTC facility. Spoke with AC at [facility] and can take if does not need therapy and so no Research scientist (life sciences) and DC Summary in by 2 pm. DC Summary pending after last PT evaluation note was placed. IF unable to discharge back to [facility] today it will be Monday, 07/11/23 per [facility] at [facility]. Time now: 2:08 pm.   Gabriel Cirri MSN RN CM  Care Management Department.  [place]  [facility] Direct Dial: [phone] Main Office Phone: [phone] Weekends Only      Transition of Care Asessment: Insurance and Status: Insurance coverage has been reviewed Patient has primary care physician: Yes Home environment has been reviewed: From LTC at [facility] Prior level of function:: Per PT evaluation notes: Prior Level of Function : Needs assist  Physical Assist : Mobility (physical)  Mobility Comments: uses rollator for all ambulation  ADLs Comments: Pt reports she needs help for bathing and dressing; Prior/Current Home Services: No current home services Social Determinants of Health Reivew: SDOH reviewed no interventions necessary Readmission risk has been reviewed: Yes Transition of care needs: transition of care needs identified, TOC will continue to follow (To return to LTC at [facility].)

## 2023-07-10 DIAGNOSIS — E875 Hyperkalemia: Secondary | ICD-10-CM | POA: Diagnosis not present

## 2023-07-10 DIAGNOSIS — I70239 Atherosclerosis of native arteries of right leg with ulceration of unspecified site: Secondary | ICD-10-CM | POA: Diagnosis not present

## 2023-07-10 LAB — BASIC METABOLIC PANEL
Anion gap: 10 (ref 5–15)
BUN: 72 mg/dL — ABNORMAL HIGH (ref 8–23)
CO2: 18 mmol/L — ABNORMAL LOW (ref 22–32)
Calcium: 9 mg/dL (ref 8.9–10.3)
Chloride: 107 mmol/L (ref 98–111)
Creatinine, Ser: 3.42 mg/dL — ABNORMAL HIGH (ref 0.44–1.00)
GFR, Estimated: 14 mL/min — ABNORMAL LOW (ref >60)
Glucose, Bld: 119 mg/dL — ABNORMAL HIGH (ref 70–99)
Potassium: 4.9 mmol/L (ref 3.5–5.1)
Sodium: 135 mmol/L (ref 135–145)

## 2023-07-10 LAB — GLUCOSE, CAPILLARY
Glucose-Capillary: 134 mg/dL — ABNORMAL HIGH (ref 70–99)
Glucose-Capillary: 126 mg/dL — ABNORMAL HIGH (ref 70–99)
Glucose-Capillary: 160 mg/dL — ABNORMAL HIGH (ref 70–99)

## 2023-07-10 NOTE — TOC Progression Note (Signed)
Transition of Care [facility]) - Progression Note    Patient Details  Name: Alicia Rogers MRN: [id number] Date of Birth: November 09, 1960  Transition of Care [facility]) CM/SW Contact  Bing Quarry, RN Phone Number: 07/10/2023, 9:38 AM  Clinical Narrative: 12/8: Upon review of AVS, there is only one new medication that will be given once a day and due at 1000 am today. RN CM messaged AC at [facility] to see if pharmacy is closed today, can they still take the patient back to LTC and obtain that medication for tomorrow's dose so patient can return to facility today instead of waiting till tomorrow if pharmacy is an issue.  Waiting on reply and updated provider on possible plan pending an answer.   Gabriel Cirri MSN RN CM  Care Management Department.  [place]  [facility] Direct Dial: [phone] Main Office Phone: [phone] Weekends Only      Expected Discharge Plan: Skilled Nursing Facility Barriers to Discharge: Continued Medical Work up  Expected Discharge Plan and Services       Living arrangements for the past 2 months: Skilled Nursing Facility                                       Social Determinants of Health (SDOH) Interventions SDOH Screenings   Food Insecurity: No Food Insecurity (07/08/2023)  Housing: Patient Declined (07/08/2023)  Transportation Needs: No Transportation Needs (07/08/2023)  Utilities: Not At Risk (07/08/2023)  Alcohol Screen: Low Risk  (09/29/2021)  Financial Resource Strain: Medium Risk (03/04/2023)   [facility]  Social Connections: Unknown (09/28/2022)   Received from [facility]  Tobacco Use: High Risk (07/07/2023)    Readmission Risk Interventions    05/10/2023   10:05 AM  Readmission Risk Prevention Plan  Transportation Screening   HRI or Home Care Consult   Palliative Care Screening   Medication Review (RN Care Manager)      Information is confidential and restricted. Go to Review  Flowsheets to unlock data.

## 2023-07-10 NOTE — Plan of Care (Signed)
  Problem: Education: Goal: Knowledge of General Education information will improve Description Including pain rating scale, medication(s)/side effects and non-pharmacologic comfort measures Outcome: Progressing   

## 2023-07-10 NOTE — TOC Transition Note (Addendum)
Transition of Care [facility]) - CM/SW Discharge Note   Patient Details  Name: Alicia Rogers MRN: [id number] Date of Birth: 1961/06/23  [facility]) CM/SW Contact:  Bing Quarry, RN Phone Number: 07/10/2023, 12:33 PM   Clinical Narrative:  12/8: Admission Coordinator responded they can take patient today with only one new medication that has already been given at 1000 am and is ordered once a day. Confirmed with provider that was still the case and it was confirmed. Updated DC Summary inboxed with outpatient follow up recommendations.   Medication list on DC Summary shows the new medication: sodium zirconium cyclosilicate 10 g Pack packet Commonly known as: LOKELMA Take 10 g by mouth daily, which patient has received today.   Wound care for heel ulcers documented in DC Summary:  Discharge wound care:       Comments: Wound care  Daily      Comments: Paint left and right heel ulcers with betadine, allow to air dry.  Offload with Prevalon boots            07/08/23 0916              07/09/23 0500              Wound care  Daily      Comments: Cleanse left hip wound with saline, pat dry. Impregnate 4x4 gauze with Santyl and place on/over wound bed, top with dry dressing. Secure with tape. Change daily   Waiting on room number and number to call report to. EMS forms printed to [facility]. [facility] when ready for transport. Report to be called to [phone] per [facility]. Given to Unit RN.  [facility] called at 2:02 PM after Unit RN called report.   Gabriel Cirri MSN RN CM  Care Management Department.  [place]  [facility] Direct Dial: [phone] Main Office Phone: [phone] Weekends Only    Final next level of care: Skilled Nursing Facility Barriers to Discharge: Barriers Resolved   Patient Goals and CMS Choice CMS Medicare.gov Compare Post Acute Care list provided to:: Patient Choice offered to / list presented to : Patient  Discharge Placement PASRR number recieved:   (Pending/facility aware.[id number] E) PASRR number recieved:  (Pending/facility aware.[id number] E)            Patient chooses bed at:  (Returning to [facility] Patient to be transferred to facility by: [facility]   Patient and family notified of of transfer: 07/10/23 (ATTEMPTED to contact sister x2  without success and unable to leave a voice mail to inform her of patient transfer back to facility. Gabriel Cirri RN CM 1257 pm.   Discharge Plan and Services Additional resources added to the After Visit Summary for                  DME Arranged: N/A DME Agency: NA       HH Arranged: NA HH Agency: NA        Social Determinants of Health (SDOH) Interventions SDOH Screenings   Food Insecurity: No Food Insecurity (07/08/2023)  Housing: Patient Declined (07/08/2023)  Transportation Needs: No Transportation Needs (07/08/2023)  Utilities: Not At Risk (07/08/2023)  Alcohol Screen: Low Risk  (09/29/2021)  Financial Resource Strain: Medium Risk (03/04/2023)   Received from [facility]  Social Connections: Unknown (09/28/2022)   Received from [facility]  Tobacco Use: High Risk (07/07/2023)     Readmission Risk Interventions    05/10/2023   10:05 AM  Readmission Risk Prevention Plan  Transportation Screening   HRI or Home Care Consult   Palliative Care Screening   Medication Review (RN Care Manager)      Information is confidential and restricted. Go to Review Flowsheets to unlock data.

## 2023-07-10 NOTE — Progress Notes (Signed)
Report given to [facility] at [facility]. Patient is aware of discharge to disposition. Ms. Manrriquez will be returning to the same room per [facility] staff (510A). Awaiting on transportation.

## 2023-07-14 ENCOUNTER — Encounter
Admission: RE | Disposition: A | Discharge status: Home / Self Care | Payer: Self-pay | Source: Ambulatory Visit | Attending: Vascular Surgery

## 2023-07-14 ENCOUNTER — Other Ambulatory Visit: Payer: Self-pay

## 2023-07-14 ENCOUNTER — Ambulatory Visit
Admission: RE | Admit: 2023-07-14 | Discharge: 2023-07-14 | Disposition: A | Discharge status: Home / Self Care | Payer: 59 | Source: Ambulatory Visit | Attending: Vascular Surgery | Admitting: Vascular Surgery

## 2023-07-14 ENCOUNTER — Ambulatory Visit: Payer: 59 | Admitting: General Practice

## 2023-07-14 ENCOUNTER — Encounter: Payer: Self-pay | Admitting: Vascular Surgery

## 2023-07-14 DIAGNOSIS — E875 Hyperkalemia: Secondary | ICD-10-CM

## 2023-07-14 DIAGNOSIS — N185 Chronic kidney disease, stage 5: Secondary | ICD-10-CM | POA: Insufficient documentation

## 2023-07-14 DIAGNOSIS — L97519 Non-pressure chronic ulcer of other part of right foot with unspecified severity: Secondary | ICD-10-CM | POA: Diagnosis not present

## 2023-07-14 DIAGNOSIS — Z7984 Long term (current) use of oral hypoglycemic drugs: Secondary | ICD-10-CM | POA: Insufficient documentation

## 2023-07-14 DIAGNOSIS — I700 Atherosclerosis of aorta: Secondary | ICD-10-CM | POA: Insufficient documentation

## 2023-07-14 DIAGNOSIS — E11621 Type 2 diabetes mellitus with foot ulcer: Secondary | ICD-10-CM | POA: Diagnosis not present

## 2023-07-14 DIAGNOSIS — E1122 Type 2 diabetes mellitus with diabetic chronic kidney disease: Secondary | ICD-10-CM | POA: Diagnosis not present

## 2023-07-14 DIAGNOSIS — I120 Hypertensive chronic kidney disease with stage 5 chronic kidney disease or end stage renal disease: Secondary | ICD-10-CM | POA: Diagnosis not present

## 2023-07-14 DIAGNOSIS — N184 Chronic kidney disease, stage 4 (severe): Secondary | ICD-10-CM | POA: Diagnosis not present

## 2023-07-14 DIAGNOSIS — F17210 Nicotine dependence, cigarettes, uncomplicated: Secondary | ICD-10-CM | POA: Diagnosis not present

## 2023-07-14 DIAGNOSIS — Z01812 Encounter for preprocedural laboratory examination: Secondary | ICD-10-CM

## 2023-07-14 HISTORY — PX: AV FISTULA PLACEMENT: SHX1204

## 2023-07-14 LAB — POCT I-STAT, CHEM 8
BUN: 61 mg/dL — ABNORMAL HIGH (ref 8–23)
Calcium, Ion: 1.29 mmol/L (ref 1.15–1.40)
Chloride: 112 mmol/L — ABNORMAL HIGH (ref 98–111)
Creatinine, Ser: 4.3 mg/dL — ABNORMAL HIGH (ref 0.44–1.00)
Glucose, Bld: 123 mg/dL — ABNORMAL HIGH (ref 70–99)
HCT: 23 % — ABNORMAL LOW (ref 36.0–46.0)
Hemoglobin: 7.8 g/dL — ABNORMAL LOW (ref 12.0–15.0)
Potassium: 4.4 mmol/L (ref 3.5–5.1)
Sodium: 137 mmol/L (ref 135–145)
TCO2: 18 mmol/L — ABNORMAL LOW (ref 22–32)

## 2023-07-14 LAB — GLUCOSE, CAPILLARY: Glucose-Capillary: 167 mg/dL — ABNORMAL HIGH (ref 70–99)

## 2023-07-14 LAB — ABO/RH: ABO/RH(D): A POS

## 2023-07-14 SURGERY — ARTERIOVENOUS (AV) FISTULA CREATION
Anesthesia: General | Laterality: Left

## 2023-07-14 MED ORDER — VANCOMYCIN HCL IN DEXTROSE 1-5 GM/200ML-% IV SOLN
INTRAVENOUS | Status: AC
  Filled 2023-07-14: qty 200

## 2023-07-14 MED ORDER — SODIUM CHLORIDE 0.9 % IV SOLN: INTRAVENOUS | Status: DC | PRN

## 2023-07-14 MED ORDER — SODIUM CHLORIDE 0.9 % IV SOLN
INTRAVENOUS | Status: DC | PRN
  Administered 2023-07-14: 501 mL via SURGICAL_CAVITY

## 2023-07-14 MED ORDER — DEXAMETHASONE SODIUM PHOSPHATE 10 MG/ML IJ SOLN
INTRAMUSCULAR | Status: DC | PRN
  Administered 2023-07-14: 5 mg via INTRAVENOUS

## 2023-07-14 MED ORDER — HYDROMORPHONE HCL 1 MG/ML IJ SOLN
1.0000 mg | Freq: Once | INTRAMUSCULAR | Status: DC | PRN
Start: 2023-07-14 — End: 2023-07-14

## 2023-07-14 MED ORDER — PROPOFOL 10 MG/ML IV BOLUS
INTRAVENOUS | Status: DC | PRN
  Administered 2023-07-14: 100 mg via INTRAVENOUS

## 2023-07-14 MED ORDER — FENTANYL CITRATE (PF) 100 MCG/2ML IJ SOLN
INTRAMUSCULAR | Status: AC
  Filled 2023-07-14: qty 2

## 2023-07-14 MED ORDER — FENTANYL CITRATE (PF) 100 MCG/2ML IJ SOLN
25.0000 ug | INTRAMUSCULAR | Status: DC | PRN
Start: 2023-07-14 — End: 2023-07-14

## 2023-07-14 MED ORDER — EPHEDRINE SULFATE-NACL 50-0.9 MG/10ML-% IV SOSY
PREFILLED_SYRINGE | INTRAVENOUS | Status: DC | PRN
  Administered 2023-07-14: 10 mg via INTRAVENOUS
  Administered 2023-07-14 (×2): 5 mg via INTRAVENOUS

## 2023-07-14 MED ORDER — PHENYLEPHRINE HCL-NACL 20-0.9 MG/250ML-% IV SOLN
INTRAVENOUS | Status: AC
  Filled 2023-07-14: qty 250

## 2023-07-14 MED ORDER — CHLORHEXIDINE GLUCONATE CLOTH 2 % EX PADS: 6.0000 | MEDICATED_PAD | Freq: Once | CUTANEOUS | Status: DC

## 2023-07-14 MED ORDER — ONDANSETRON HCL 4 MG/2ML IJ SOLN
INTRAMUSCULAR | Status: AC
  Filled 2023-07-14: qty 2

## 2023-07-14 MED ORDER — LIDOCAINE HCL (CARDIAC) PF 100 MG/5ML IV SOSY
PREFILLED_SYRINGE | INTRAVENOUS | Status: DC | PRN
  Administered 2023-07-14: 60 mg via INTRAVENOUS

## 2023-07-14 MED ORDER — VANCOMYCIN HCL IN DEXTROSE 1-5 GM/200ML-% IV SOLN
1000.0000 mg | INTRAVENOUS | Status: AC
  Administered 2023-07-14: 1000 mg via INTRAVENOUS

## 2023-07-14 MED ORDER — LIDOCAINE HCL (PF) 2 % IJ SOLN
INTRAMUSCULAR | Status: AC
  Filled 2023-07-14: qty 5

## 2023-07-14 MED ORDER — PROPOFOL 10 MG/ML IV BOLUS
INTRAVENOUS | Status: AC
  Filled 2023-07-14: qty 40

## 2023-07-14 MED ORDER — SEVOFLURANE IN SOLN
RESPIRATORY_TRACT | Status: AC
  Filled 2023-07-14: qty 250

## 2023-07-14 MED ORDER — CHLORHEXIDINE GLUCONATE 0.12 % MT SOLN
OROMUCOSAL | Status: AC
  Filled 2023-07-14: qty 15

## 2023-07-14 MED ORDER — PHENYLEPHRINE HCL-NACL 20-0.9 MG/250ML-% IV SOLN
INTRAVENOUS | Status: DC | PRN
  Administered 2023-07-14: 30 ug/min via INTRAVENOUS

## 2023-07-14 MED ORDER — HEPARIN SODIUM (PORCINE) 5000 UNIT/ML IJ SOLN
INTRAMUSCULAR | Status: AC
  Filled 2023-07-14: qty 1

## 2023-07-14 MED ORDER — MIDAZOLAM HCL 2 MG/2ML IJ SOLN
INTRAMUSCULAR | Status: AC
  Filled 2023-07-14: qty 2

## 2023-07-14 MED ORDER — MIDAZOLAM HCL 2 MG/2ML IJ SOLN
INTRAMUSCULAR | Status: DC | PRN
  Administered 2023-07-14: 1 mg via INTRAVENOUS

## 2023-07-14 MED ORDER — DEXAMETHASONE SODIUM PHOSPHATE 10 MG/ML IJ SOLN
INTRAMUSCULAR | Status: AC
  Filled 2023-07-14: qty 1

## 2023-07-14 MED ORDER — ONDANSETRON HCL 4 MG/2ML IJ SOLN
INTRAMUSCULAR | Status: DC | PRN
  Administered 2023-07-14: 4 mg via INTRAVENOUS

## 2023-07-14 MED ORDER — HYDROCODONE-ACETAMINOPHEN 5-325 MG PO TABS: 2.0000 | ORAL_TABLET | Freq: Four times a day (QID) | ORAL | 0 refills | Status: AC | PRN

## 2023-07-14 MED ORDER — FENTANYL CITRATE (PF) 100 MCG/2ML IJ SOLN
INTRAMUSCULAR | Status: DC | PRN
  Administered 2023-07-14: 25 ug via INTRAVENOUS

## 2023-07-14 MED ORDER — HEMOSTATIC AGENTS (NO CHARGE) OPTIME
TOPICAL | Status: DC | PRN
  Administered 2023-07-14: 1 via TOPICAL

## 2023-07-14 MED ORDER — OXYCODONE HCL 5 MG/5ML PO SOLN: 5.0000 mg | Freq: Once | ORAL | Status: DC | PRN

## 2023-07-14 MED ORDER — ACETAMINOPHEN 10 MG/ML IV SOLN
INTRAVENOUS | Status: AC
  Filled 2023-07-14: qty 100

## 2023-07-14 MED ORDER — HEPARIN SODIUM (PORCINE) 1000 UNIT/ML IJ SOLN
INTRAMUSCULAR | Status: DC | PRN
  Administered 2023-07-14: 3000 [IU] via INTRAVENOUS

## 2023-07-14 MED ORDER — PHENYLEPHRINE 80 MCG/ML (10ML) SYRINGE FOR IV PUSH (FOR BLOOD PRESSURE SUPPORT)
PREFILLED_SYRINGE | INTRAVENOUS | Status: DC | PRN
  Administered 2023-07-14 (×2): 80 ug via INTRAVENOUS
  Administered 2023-07-14 (×2): 160 ug via INTRAVENOUS
  Administered 2023-07-14: 80 ug via INTRAVENOUS

## 2023-07-14 MED ORDER — OXYCODONE HCL 5 MG PO TABS: 5.0000 mg | ORAL_TABLET | Freq: Once | ORAL | Status: DC | PRN

## 2023-07-14 MED ORDER — ONDANSETRON HCL 4 MG/2ML IJ SOLN: 4.0000 mg | Freq: Four times a day (QID) | INTRAMUSCULAR | Status: DC | PRN

## 2023-07-14 MED ORDER — ACETAMINOPHEN 10 MG/ML IV SOLN
INTRAVENOUS | Status: DC | PRN
  Administered 2023-07-14: 1000 mg via INTRAVENOUS

## 2023-07-14 SURGICAL SUPPLY — 43 items
BAG DECANTER FOR FLEXI CONT (MISCELLANEOUS) ×1 IMPLANT
BLADE SURG SZ11 CARB STEEL (BLADE) ×1 IMPLANT
BRUSH SCRUB EZ 4% CHG (MISCELLANEOUS) ×1 IMPLANT
CHLORAPREP W/TINT 26 (MISCELLANEOUS) ×1 IMPLANT
CLAMP SUTURE YELLOW 5 PAIRS (MISCELLANEOUS) ×1 IMPLANT
CLIP SPRNG 6 S-JAW DBL (CLIP) ×1 IMPLANT
CLIP SPRNG 6MM S-JAW DBL (CLIP) ×1
DERMABOND ADVANCED .7 DNX12 (GAUZE/BANDAGES/DRESSINGS) IMPLANT
DRESSING SURGICEL FIBRLLR 1X2 (HEMOSTASIS) ×1 IMPLANT
DRSG SURGICEL FIBRILLAR 1X2 (HEMOSTASIS) ×1
ELECT CAUTERY BLADE 6.4 (BLADE) ×1 IMPLANT
ELECT REM PT RETURN 9FT ADLT (ELECTROSURGICAL) ×1
ELECTRODE REM PT RTRN 9FT ADLT (ELECTROSURGICAL) ×1 IMPLANT
GLOVE BIO SURGEON STRL SZ7 (GLOVE) ×2 IMPLANT
GOWN STRL REUS W/ TWL LRG LVL3 (GOWN DISPOSABLE) ×2 IMPLANT
GOWN STRL REUS W/TWL 2XL LVL3 (GOWN DISPOSABLE) ×1 IMPLANT
IV NS 500ML BAXH (IV SOLUTION) ×1 IMPLANT
KIT TURNOVER KIT A (KITS) ×1 IMPLANT
LABEL OR SOLS (LABEL) ×1 IMPLANT
LOOP VESSEL MAXI 1X406 RED (MISCELLANEOUS) ×1 IMPLANT
LOOP VESSEL MINI 0.8X406 BLUE (MISCELLANEOUS) ×1 IMPLANT
MANIFOLD NEPTUNE II (INSTRUMENTS) ×1 IMPLANT
NDL FILTER BLUNT 18X1 1/2 (NEEDLE) ×1 IMPLANT
NEEDLE FILTER BLUNT 18X1 1/2 (NEEDLE) ×1 IMPLANT
NS IRRIG 500ML POUR BTL (IV SOLUTION) ×1 IMPLANT
PACK EXTREMITY ARMC (MISCELLANEOUS) ×1 IMPLANT
PAD PREP OB/GYN DISP 24X41 (PERSONAL CARE ITEMS) ×1 IMPLANT
SOLUTION CELL SAVER (CLIP) ×1 IMPLANT
SPIKE FLUID TRANSFER (MISCELLANEOUS) ×1 IMPLANT
STOCKINETTE 48X4 2 PLY STRL (GAUZE/BANDAGES/DRESSINGS) ×1 IMPLANT
STOCKINETTE STRL 4IN 9604848 (GAUZE/BANDAGES/DRESSINGS) ×1 IMPLANT
SUT GORETEX 6.0 TT9 (SUTURE) ×1 IMPLANT
SUT MNCRL AB 4-0 PS2 18 (SUTURE) ×1 IMPLANT
SUT PROLENE 6 0 BV (SUTURE) ×4 IMPLANT
SUT SILK 0 SH 30 (SUTURE) ×1 IMPLANT
SUT SILK 2-0 18XBRD TIE 12 (SUTURE) ×1 IMPLANT
SUT SILK 3-0 18XBRD TIE 12 (SUTURE) ×1 IMPLANT
SUT VIC AB 3-0 SH 27X BRD (SUTURE) ×1 IMPLANT
SYR 20ML LL LF (SYRINGE) ×1 IMPLANT
SYR 3ML LL SCALE MARK (SYRINGE) ×1 IMPLANT
TAG SUTURE CLAMP YLW 5PR (MISCELLANEOUS) ×1
TRAP FLUID SMOKE EVACUATOR (MISCELLANEOUS) ×1 IMPLANT
WATER STERILE IRR 500ML POUR (IV SOLUTION) ×1 IMPLANT

## 2023-07-14 NOTE — Anesthesia Postprocedure Evaluation (Signed)
Anesthesia Post Note  Patient: Alicia Rogers  Procedure(s) Performed: ARTERIOVENOUS (AV) FISTULA CREATION (Left)  Patient location during evaluation: PACU Anesthesia Type: General Level of consciousness: awake and alert Pain management: pain level controlled Vital Signs Assessment: post-procedure vital signs reviewed and stable Respiratory status: spontaneous breathing, nonlabored ventilation, respiratory function stable and patient connected to nasal cannula oxygen Cardiovascular status: blood pressure returned to baseline and stable Postop Assessment: no apparent nausea or vomiting Anesthetic complications: no  No notable events documented.   Last Vitals:  Vitals:   07/14/23 0915 07/14/23 0930  BP: (!) 112/44 (!) 108/44  Pulse: (!) 59 61  Resp: 18 11  Temp: (!) 36.2 C (!) 36.2 C  SpO2: 100% 95%    Last Pain:  Vitals:   07/14/23 0930  TempSrc:   PainSc: 0-No pain                 Stephanie Coup

## 2023-07-14 NOTE — Op Note (Signed)
[facility]   OPERATIVE NOTE   PROCEDURE: Left brachiocephalic arteriovenous fistula placement  PRE-OPERATIVE DIAGNOSIS: 1.  Chronic kidney disease stage IV nearing dialysis dependence  POST-OPERATIVE DIAGNOSIS: 1.  Same as above  SURGEON: Festus Barren, MD  ASSISTANT(S): None  ANESTHESIA: general  ESTIMATED BLOOD LOSS: 10 cc  FINDING(S): Adequate cephalic vein for fistula creation  SPECIMEN(S):  none  INDICATIONS:   Alicia Rogers is a 62 y.o. female who presents with renal failure in need of pemanent dialysis acces.  The patient is scheduled for left arm AV access placement.  The patient is aware the risks include but are not limited to: bleeding, infection, steal syndrome, nerve damage, ischemic monomelic neuropathy, failure to mature, and need for additional procedures.  The patient is aware of the risks of the procedure and elects to proceed forward.  DESCRIPTION: After full informed written consent was obtained from the patient, the patient was brought back to the operating room and placed supine upon the operating table.  Prior to induction, the patient received IV antibiotics.   After obtaining adequate anesthesia, the patient was then prepped and draped in the standard fashion for a left arm access procedure.  I made a curvilinear incision at the level of the antecubital fossa and dissected through the subcutaneous tissue and fascia to gain exposure of the brachial artery.  This was noted to be patent and adequate in size for fistula creation.  This was dissected out proximally and distally and prepared for control with vessel loops.  Initially, based off of the preoperative vein mapping, it was felt this would likely have to be a graft creation as no adequate cephalic vein was clearly identified.  However, the cephalic vein appeared of better size and was widely patent when seen after her surgical incision.  I then dissected out the cephalic vein.  This  was noted to be patent and adequate in size for fistula creation.  I then gave the patient 3000 units of intravenous heparin.  The vein was marked for orientation and the distal segment of the vein was ligated with a  2-0 silk, and the vein was transected.  I then instilled the heparinized saline into the vein and clamped it.  At this point, I reset my exposure of the brachial artery and pulled up control on the vessel loops.  I made an arteriotomy with a #11 blade, and then I extended the arteriotomy with a Potts scissor.  I injected heparinized saline proximal and distal to this arteriotomy.  The vein was then sewn to the artery in an end-to-side configuration with a running stitch of 6-0 Prolene.  Prior to completing this anastomosis, I allowed the vein and artery to backbleed.  There was no evidence of clot from any vessels.  I completed the anastomosis in the usual fashion and then released all vessel loops and clamps.  There was a palpable  thrill in the venous outflow, and there was a palpable pulse in the artery distal to the anastomosis.  At this point, I irrigated out the surgical wound.  Surgicel was placed. There was no further active bleeding.  The subcutaneous tissue was reapproximated with a running stitch of 3-0 Vicryl.  The skin was then closed with a 4-0 Monocryl suture.  The skin was then cleaned, dried, and reinforced with Dermabond.  The patient tolerated this procedure well and was taken to the recovery room in stable condition  COMPLICATIONS: None  CONDITION: Stable  Festus Barren    07/14/2023, 9:13 AM  This note was created with Dragon Medical transcription system. Any errors in dictation are purely unintentional.

## 2023-07-14 NOTE — Anesthesia Preprocedure Evaluation (Signed)
Anesthesia Evaluation  Patient identified by MRN, date of birth, ID band Patient awake    Reviewed: Allergy & Precautions, H&P , NPO status , Patient's Chart, lab work & pertinent test results, reviewed documented beta blocker date and time   Airway Mallampati: III  TM Distance: >3 FB Neck ROM: full    Dental  (+) Chipped, Dental Advidsory Given   Pulmonary COPD,  COPD inhaler, Current Smoker   Pulmonary exam normal        Cardiovascular Exercise Tolerance: Poor hypertension, On Medications + Peripheral Vascular Disease  Normal cardiovascular exam Rhythm:regular Rate:Normal     Neuro/Psych  PSYCHIATRIC DISORDERS  Depression Bipolar Disorder Schizophrenia  negative neurological ROS     GI/Hepatic negative GI ROS, Neg liver ROS,,,  Endo/Other  negative endocrine ROSdiabetes, Well Controlled    Renal/GU Dialysis and CRFRenal disease     Musculoskeletal   Abdominal   Peds  Hematology negative hematology ROS (+)   Anesthesia Other Findings Past Medical History: No date: Anemia No date: Aortic atherosclerosis (HCC) No date: Bilateral adrenal adenomas No date: CKD (chronic kidney disease), stage V (HCC) No date: Depression No date: Diabetes mellitus, type II (HCC) No date: Diverticulosis 09/27/2021: Hand swelling No date: Hypertension No date: Paranoid schizophrenia (HCC) No date: Proteinuria No date: Schizophrenia [facility])  Past Surgical History: 07/21/2022: COLONOSCOPY WITH PROPOFOL; N/A     Comment:  Procedure: COLONOSCOPY WITH PROPOFOL;  Surgeon: Toney Reil, MD;  Location: [facility] ENDOSCOPY;  Service:               Gastroenterology;  Laterality: N/A; No date: colonscopy 07/21/2022: ESOPHAGOGASTRODUODENOSCOPY (EGD) WITH PROPOFOL; N/A     Comment:  Procedure: ESOPHAGOGASTRODUODENOSCOPY (EGD) WITH               PROPOFOL;  Surgeon: Toney Reil, MD;  Location:               [facility]  ENDOSCOPY;  Service: Gastroenterology;  Laterality:               N/A; 07/08/2023: LOWER EXTREMITY ANGIOGRAPHY; Right     Comment:  Procedure: Lower Extremity Angiography;  Surgeon: Annice Needy, MD;  Location: [facility] INVASIVE CV LAB;  Service:               Cardiovascular;  Laterality: Right;     Reproductive/Obstetrics negative OB ROS                             Anesthesia Physical Anesthesia Plan  ASA: 4  Anesthesia Plan: General   Post-op Pain Management:    Induction: Intravenous  PONV Risk Score and Plan: 3 and Ondansetron and Dexamethasone  Airway Management Planned: LMA  Additional Equipment:   Intra-op Plan:   Post-operative Plan: Extubation in OR  Informed Consent: I have reviewed the patients History and Physical, chart, labs and discussed the procedure including the risks, benefits and alternatives for the proposed anesthesia with the patient or authorized representative who has indicated his/her understanding and acceptance.     Dental Advisory Given  Plan Discussed with: Anesthesiologist, CRNA and Surgeon  Anesthesia Plan Comments: (Patient consented for risks of anesthesia including but not limited to:  - adverse reactions to medications - damage to eyes, teeth, lips or other oral mucosa -  nerve damage due to positioning  - sore throat or hoarseness - Damage to heart, brain, nerves, lungs, other parts of body or loss of life  Patient voiced understanding and assent.)       Anesthesia Quick Evaluation

## 2023-07-14 NOTE — Interval H&P Note (Signed)
History and Physical Interval Note:  07/14/2023 7:26 AM  Alicia Rogers  has presented today for surgery, with the diagnosis of ESRD.  The various methods of treatment have been discussed with the patient and family. After consideration of risks, benefits and other options for treatment, the patient has consented to  Procedure(s): INSERTION OF ARTERIOVENOUS (AV) GORE-TEX GRAFT ARM (Left) as a surgical intervention.  The patient's history has been reviewed, patient examined, no change in status, stable for surgery.  I have reviewed the patient's chart and labs.  Questions were answered to the patient's satisfaction.     Festus Barren

## 2023-07-14 NOTE — Anesthesia Procedure Notes (Signed)
Procedure Name: LMA Insertion Date/Time: 07/14/2023 7:45 AM  Performed by: Ginger Carne, CRNAPre-anesthesia Checklist: Patient identified, Emergency Drugs available, Suction available, Patient being monitored and Timeout performed Patient Re-evaluated:Patient Re-evaluated prior to induction Oxygen Delivery Method: Circle system utilized Preoxygenation: Pre-oxygenation with 100% oxygen Induction Type: IV induction Ventilation: Mask ventilation without difficulty LMA: LMA inserted LMA Size: 3.0 Tube type: Oral Number of attempts: 1 Placement Confirmation: positive ETCO2 and breath sounds checked- equal and bilateral Tube secured with: Tape Dental Injury: Teeth and Oropharynx as per pre-operative assessment

## 2023-07-14 NOTE — Transfer of Care (Signed)
Immediate Anesthesia Transfer of Care Note  Patient: Alicia Rogers  Procedure(s) Performed: ARTERIOVENOUS (AV) FISTULA CREATION (Left)  Patient Location: PACU  Anesthesia Type:General  Level of Consciousness: sedated  Airway & Oxygen Therapy: Patient Spontanous Breathing and Patient connected to face mask oxygen  Post-op Assessment: Report given to RN and Post -op Vital signs reviewed and stable  Post vital signs: Reviewed and stable  Last Vitals:  Vitals Value Taken Time  BP 76/47 07/14/23 0908  Temp    Pulse 61 07/14/23 0908  Resp 13 07/14/23 0908  SpO2 100 % 07/14/23 0908    Last Pain:  Vitals:   07/14/23 0626  TempSrc: Temporal  PainSc: 0-No pain         Complications: No notable events documented.

## 2023-07-15 ENCOUNTER — Encounter: Payer: Self-pay | Admitting: Vascular Surgery

## 2023-08-02 ENCOUNTER — Encounter: Payer: Self-pay | Admitting: *Deleted

## 2023-08-05 ENCOUNTER — Ambulatory Visit (INDEPENDENT_AMBULATORY_CARE_PROVIDER_SITE_OTHER): Payer: 59 | Admitting: Podiatry

## 2023-08-05 ENCOUNTER — Encounter: Payer: Self-pay | Admitting: Podiatry

## 2023-08-05 VITALS — Ht 62.0 in | Wt 113.0 lb

## 2023-08-05 DIAGNOSIS — E0822 Diabetes mellitus due to underlying condition with diabetic chronic kidney disease: Secondary | ICD-10-CM | POA: Diagnosis not present

## 2023-08-05 DIAGNOSIS — B351 Tinea unguium: Secondary | ICD-10-CM

## 2023-08-05 DIAGNOSIS — R600 Localized edema: Secondary | ICD-10-CM | POA: Diagnosis not present

## 2023-08-05 DIAGNOSIS — E11621 Type 2 diabetes mellitus with foot ulcer: Secondary | ICD-10-CM

## 2023-08-05 DIAGNOSIS — N185 Chronic kidney disease, stage 5: Secondary | ICD-10-CM

## 2023-08-05 DIAGNOSIS — M79675 Pain in left toe(s): Secondary | ICD-10-CM | POA: Diagnosis not present

## 2023-08-05 DIAGNOSIS — M79674 Pain in right toe(s): Secondary | ICD-10-CM | POA: Diagnosis not present

## 2023-08-05 DIAGNOSIS — E119 Type 2 diabetes mellitus without complications: Secondary | ICD-10-CM

## 2023-08-09 NOTE — Progress Notes (Signed)
 Subjective:  Patient ID: Alicia Rogers, female    DOB: 04/20/1961,  MRN: [id number]  Alicia Rogers presents to clinic today for for annual diabetic foot examination, at risk foot care. Pt has h/o NIDDM with chronic kidney disease, and painful elongated mycotic toenails 1-5 bilaterally which are tender when wearing enclosed shoe gear. Pain is relieved with periodic professional debridement. She has open wounds posterior aspect of both heels being managed by facility Wound Care team. Has also been evaluated by Vascular Surgery for atherosclerosis. Chief Complaint  Patient presents with   Nail Problem    Patient is here for RFC, patient also has open wound on back of both heels, very sore right more than left   PCP is Alicia Rogers, Alicia GRADE, MD.  Allergies  Allergen Reactions   Penicillins     Rash   Lisinopril Other (See Comments)    Cough    Review of Systems: Negative except as noted in the HPI.  Objective: No changes noted in today's physical examination. There were no vitals filed for this visit. Alicia Rogers is a pleasant 63 y.o. female WD, WN in NAD. AAO x 3.   Diabetic foot exam was performed with the following findings:   CFT <3 seconds b/l LE. Faintly palpable DP pulses b/l LE. Faintly palpable PT pulse(s) b/l LE. Pedal hair absent. No pain with calf compression b/l. Nonpitting edema noted BLE. No ischemia or gangrene noted b/l LE. No cyanosis or clubbing noted b/l LE.  Protective sensation intact 5/5 intact bilaterally with 10g monofilament b/l. Vibratory sensation intact b/l.  Posterior heel decubitus ulcerations b/l managed by [facility]. Remaining pedal skin is warm and supple b/l LE. No interdigital macerations noted b/l LE. Toenails 1-5 b/l elongated, discolored, dystrophic, thickened, crumbly with subungual debris and tenderness to dorsal palpation. No hyperkeratotic nor porokeratotic lesions present on today's visit.  Muscle strength 5/5  to all lower extremity muscle groups bilaterally. No pain, crepitus or joint limitation noted with ROM bilateral LE. Pes planus deformity noted bilateral LE.       Assessment/Plan: 1. Pain due to onychomycosis of toenails of both feet   2. Pedal edema   3. Diabetic ulcer of right heel associated with type 2 diabetes mellitus, with fat layer exposed (HCC)   4. Diabetic ulcer of left heel associated with type 2 diabetes mellitus, with fat layer exposed (HCC)   5. Diabetes mellitus due to underlying condition with stage 5 chronic kidney disease not on chronic dialysis, without long-term current use of insulin  (HCC)   6. Encounter for diabetic foot exam (HCC)    ADA FOOT RISK CATEGORIZATION High Risk:  Patient has one or more of the following: Loss of protective sensation Absent pedal pulses Severe Foot deformity History of foot ulcer  -Patient was evaluated today. All questions/concerns addressed on today's visit. -Patient with bilateral heel wounds being managed by Wound Care at [facility]. -Facility to continue fall precautions and pressure precautions. -Diabetic foot examination performed today. -Continue diabetic foot care principles: inspect feet daily, monitor glucose as recommended by PCP and/or Endocrinologist, and follow prescribed diet per PCP, Endocrinologist and/or dietician. -Toenails 1-5 b/l were debrided in length and girth with sterile nail nippers without iatrogenic bleeding. Patient declined use of dremel. -Patient/POA to call should there be question/concern in the interim.   Return in about 3 months (around 11/03/2023).  Alicia Rogers, DPM      [facility] LOCATION: [address]  [place], [place] [address]                   Office [phone]   [facility] LOCATION: [address] [place], [place] [address] Office [phone]

## 2023-08-11 ENCOUNTER — Encounter: Payer: Self-pay | Admitting: Podiatry

## 2023-08-22 ENCOUNTER — Other Ambulatory Visit (INDEPENDENT_AMBULATORY_CARE_PROVIDER_SITE_OTHER): Payer: Self-pay | Admitting: Vascular Surgery

## 2023-08-22 DIAGNOSIS — I739 Peripheral vascular disease, unspecified: Secondary | ICD-10-CM

## 2023-08-22 DIAGNOSIS — Z9889 Other specified postprocedural states: Secondary | ICD-10-CM

## 2023-08-22 DIAGNOSIS — N186 End stage renal disease: Secondary | ICD-10-CM

## 2023-08-25 ENCOUNTER — Emergency Department: Payer: 59

## 2023-08-25 ENCOUNTER — Other Ambulatory Visit: Payer: Self-pay

## 2023-08-25 ENCOUNTER — Inpatient Hospital Stay
Admission: EM | Admit: 2023-08-25 | Discharge: 2023-08-27 | DRG: 683 | Discharge status: Against Medical Advice | Payer: 59 | Attending: Osteopathic Medicine | Admitting: Osteopathic Medicine

## 2023-08-25 DIAGNOSIS — Z7982 Long term (current) use of aspirin: Secondary | ICD-10-CM

## 2023-08-25 DIAGNOSIS — R600 Localized edema: Secondary | ICD-10-CM

## 2023-08-25 DIAGNOSIS — D631 Anemia in chronic kidney disease: Secondary | ICD-10-CM | POA: Diagnosis present

## 2023-08-25 DIAGNOSIS — F250 Schizoaffective disorder, bipolar type: Secondary | ICD-10-CM | POA: Diagnosis present

## 2023-08-25 DIAGNOSIS — N2581 Secondary hyperparathyroidism of renal origin: Secondary | ICD-10-CM | POA: Diagnosis present

## 2023-08-25 DIAGNOSIS — R601 Generalized edema: Secondary | ICD-10-CM | POA: Diagnosis not present

## 2023-08-25 DIAGNOSIS — Z83511 Family history of glaucoma: Secondary | ICD-10-CM

## 2023-08-25 DIAGNOSIS — Z8249 Family history of ischemic heart disease and other diseases of the circulatory system: Secondary | ICD-10-CM

## 2023-08-25 DIAGNOSIS — F17200 Nicotine dependence, unspecified, uncomplicated: Secondary | ICD-10-CM | POA: Diagnosis present

## 2023-08-25 DIAGNOSIS — I1311 Hypertensive heart and chronic kidney disease without heart failure, with stage 5 chronic kidney disease, or end stage renal disease: Principal | ICD-10-CM | POA: Diagnosis present

## 2023-08-25 DIAGNOSIS — N185 Chronic kidney disease, stage 5: Secondary | ICD-10-CM | POA: Diagnosis present

## 2023-08-25 DIAGNOSIS — Z794 Long term (current) use of insulin: Secondary | ICD-10-CM

## 2023-08-25 DIAGNOSIS — Z79899 Other long term (current) drug therapy: Secondary | ICD-10-CM

## 2023-08-25 DIAGNOSIS — J449 Chronic obstructive pulmonary disease, unspecified: Secondary | ICD-10-CM | POA: Diagnosis present

## 2023-08-25 DIAGNOSIS — E785 Hyperlipidemia, unspecified: Secondary | ICD-10-CM | POA: Diagnosis present

## 2023-08-25 DIAGNOSIS — I10 Essential (primary) hypertension: Secondary | ICD-10-CM | POA: Diagnosis present

## 2023-08-25 DIAGNOSIS — N189 Chronic kidney disease, unspecified: Secondary | ICD-10-CM | POA: Diagnosis present

## 2023-08-25 DIAGNOSIS — E1122 Type 2 diabetes mellitus with diabetic chronic kidney disease: Secondary | ICD-10-CM | POA: Diagnosis present

## 2023-08-25 DIAGNOSIS — Z5329 Procedure and treatment not carried out because of patient's decision for other reasons: Secondary | ICD-10-CM | POA: Diagnosis present

## 2023-08-25 DIAGNOSIS — Z8042 Family history of malignant neoplasm of prostate: Secondary | ICD-10-CM

## 2023-08-25 DIAGNOSIS — E1129 Type 2 diabetes mellitus with other diabetic kidney complication: Secondary | ICD-10-CM | POA: Diagnosis present

## 2023-08-25 DIAGNOSIS — Z7984 Long term (current) use of oral hypoglycemic drugs: Secondary | ICD-10-CM

## 2023-08-25 DIAGNOSIS — E8770 Fluid overload, unspecified: Secondary | ICD-10-CM | POA: Diagnosis present

## 2023-08-25 DIAGNOSIS — E1151 Type 2 diabetes mellitus with diabetic peripheral angiopathy without gangrene: Secondary | ICD-10-CM | POA: Diagnosis present

## 2023-08-25 DIAGNOSIS — E875 Hyperkalemia: Principal | ICD-10-CM | POA: Diagnosis present

## 2023-08-25 DIAGNOSIS — Z818 Family history of other mental and behavioral disorders: Secondary | ICD-10-CM

## 2023-08-25 DIAGNOSIS — D472 Monoclonal gammopathy: Secondary | ICD-10-CM | POA: Diagnosis present

## 2023-08-25 DIAGNOSIS — J439 Emphysema, unspecified: Secondary | ICD-10-CM

## 2023-08-25 DIAGNOSIS — Z833 Family history of diabetes mellitus: Secondary | ICD-10-CM

## 2023-08-25 DIAGNOSIS — F17210 Nicotine dependence, cigarettes, uncomplicated: Secondary | ICD-10-CM | POA: Diagnosis present

## 2023-08-25 LAB — CBC WITH DIFFERENTIAL/PLATELET
Abs Immature Granulocytes: 0.06 10*3/uL (ref 0.00–0.07)
Basophils Absolute: 0 10*3/uL (ref 0.0–0.1)
Basophils Relative: 0 %
Eosinophils Absolute: 0.1 10*3/uL (ref 0.0–0.5)
Eosinophils Relative: 1 %
HCT: 26.3 % — ABNORMAL LOW (ref 36.0–46.0)
Hemoglobin: 7.6 g/dL — ABNORMAL LOW (ref 12.0–15.0)
Immature Granulocytes: 1 %
Lymphocytes Relative: 18 %
Lymphs Abs: 1.6 10*3/uL (ref 0.7–4.0)
MCH: 29.6 pg (ref 26.0–34.0)
MCHC: 28.9 g/dL — ABNORMAL LOW (ref 30.0–36.0)
MCV: 102.3 fL — ABNORMAL HIGH (ref 80.0–100.0)
Monocytes Absolute: 0.9 10*3/uL (ref 0.1–1.0)
Monocytes Relative: 10 %
Neutro Abs: 6.1 10*3/uL (ref 1.7–7.7)
Neutrophils Relative %: 70 %
Platelets: 368 10*3/uL (ref 150–400)
RBC: 2.57 MIL/uL — ABNORMAL LOW (ref 3.87–5.11)
RDW: 15.1 % (ref 11.5–15.5)
WBC: 8.9 10*3/uL (ref 4.0–10.5)
nRBC: 0.6 % — ABNORMAL HIGH (ref 0.0–0.2)

## 2023-08-25 LAB — COMPREHENSIVE METABOLIC PANEL
ALT: 22 U/L (ref 0–44)
AST: 19 U/L (ref 15–41)
Albumin: 2.8 g/dL — ABNORMAL LOW (ref 3.5–5.0)
Alkaline Phosphatase: 94 U/L (ref 38–126)
Anion gap: 10 (ref 5–15)
BUN: 57 mg/dL — ABNORMAL HIGH (ref 8–23)
CO2: 17 mmol/L — ABNORMAL LOW (ref 22–32)
Calcium: 9.1 mg/dL (ref 8.9–10.3)
Chloride: 116 mmol/L — ABNORMAL HIGH (ref 98–111)
Creatinine, Ser: 2.81 mg/dL — ABNORMAL HIGH (ref 0.44–1.00)
GFR, Estimated: 18 mL/min — ABNORMAL LOW (ref >60)
Glucose, Bld: 141 mg/dL — ABNORMAL HIGH (ref 70–99)
Potassium: 5.7 mmol/L — ABNORMAL HIGH (ref 3.5–5.1)
Sodium: 143 mmol/L (ref 135–145)
Total Bilirubin: 0.3 mg/dL (ref 0.0–1.2)
Total Protein: 7 g/dL (ref 6.5–8.1)

## 2023-08-25 LAB — CBG MONITORING, ED: Glucose-Capillary: 96 mg/dL (ref 70–99)

## 2023-08-25 LAB — MAGNESIUM: Magnesium: 1.8 mg/dL (ref 1.7–2.4)

## 2023-08-25 LAB — BRAIN NATRIURETIC PEPTIDE: B Natriuretic Peptide: 155.4 pg/mL — ABNORMAL HIGH (ref 0.0–100.0)

## 2023-08-25 MED ORDER — HEPARIN SODIUM (PORCINE) 5000 UNIT/ML IJ SOLN
5000.0000 [IU] | Freq: Three times a day (TID) | INTRAMUSCULAR | Status: DC
  Administered 2023-08-25 – 2023-08-27 (×6): 5000 [IU] via SUBCUTANEOUS
  Filled 2023-08-25 (×6): qty 1

## 2023-08-25 MED ORDER — NICOTINE 21 MG/24HR TD PT24
21.0000 mg | MEDICATED_PATCH | Freq: Every day | TRANSDERMAL | Status: DC
Start: 2023-08-25 — End: 2023-08-27
  Administered 2023-08-25: 21 mg via TRANSDERMAL
  Filled 2023-08-25 (×3): qty 1

## 2023-08-25 MED ORDER — INSULIN ASPART 100 UNIT/ML IJ SOLN
0.0000 [IU] | Freq: Three times a day (TID) | INTRAMUSCULAR | Status: DC
  Administered 2023-08-26 (×2): 1 [IU] via SUBCUTANEOUS
  Filled 2023-08-25 (×2): qty 1

## 2023-08-25 MED ORDER — DEXTROSE 50 % IV SOLN
50.0000 mL | Freq: Once | INTRAVENOUS | Status: AC
  Administered 2023-08-25: 50 mL via INTRAVENOUS
  Filled 2023-08-25: qty 50

## 2023-08-25 MED ORDER — FUROSEMIDE 10 MG/ML IJ SOLN
80.0000 mg | Freq: Two times a day (BID) | INTRAMUSCULAR | Status: DC
  Administered 2023-08-25 – 2023-08-27 (×4): 80 mg via INTRAVENOUS
  Filled 2023-08-25 (×4): qty 8

## 2023-08-25 MED ORDER — PATIROMER SORBITEX CALCIUM 8.4 G PO PACK
16.8000 g | PACK | Freq: Every day | ORAL | Status: DC
  Administered 2023-08-26: 16.8 g via ORAL
  Filled 2023-08-25: qty 2

## 2023-08-25 MED ORDER — FUROSEMIDE 10 MG/ML IJ SOLN
120.0000 mg | Freq: Two times a day (BID) | INTRAVENOUS | Status: DC
  Filled 2023-08-25: qty 12

## 2023-08-25 MED ORDER — ALBUTEROL SULFATE HFA 108 (90 BASE) MCG/ACT IN AERS
2.0000 | INHALATION_SPRAY | RESPIRATORY_TRACT | Status: DC | PRN
Start: 2023-08-25 — End: 2023-08-25

## 2023-08-25 MED ORDER — DM-GUAIFENESIN ER 30-600 MG PO TB12: 1.0000 | ORAL_TABLET | Freq: Two times a day (BID) | ORAL | Status: DC | PRN

## 2023-08-25 MED ORDER — INSULIN ASPART 100 UNIT/ML IV SOLN
8.0000 [IU] | Freq: Once | INTRAVENOUS | Status: AC
  Administered 2023-08-25: 8 [IU] via INTRAVENOUS
  Filled 2023-08-25: qty 0.08

## 2023-08-25 MED ORDER — FUROSEMIDE 10 MG/ML IJ SOLN: 40.0000 mg | Freq: Once | INTRAMUSCULAR | Status: DC

## 2023-08-25 MED ORDER — SODIUM BICARBONATE 8.4 % IV SOLN
50.0000 meq | Freq: Once | INTRAVENOUS | Status: AC
  Administered 2023-08-25: 50 meq via INTRAVENOUS
  Filled 2023-08-25: qty 50

## 2023-08-25 MED ORDER — ACETAMINOPHEN 325 MG PO TABS: 650.0000 mg | ORAL_TABLET | Freq: Four times a day (QID) | ORAL | Status: DC | PRN

## 2023-08-25 MED ORDER — INSULIN ASPART 100 UNIT/ML IJ SOLN
0.0000 [IU] | Freq: Every day | INTRAMUSCULAR | Status: DC
  Filled 2023-08-25: qty 1

## 2023-08-25 MED ORDER — HYDRALAZINE HCL 20 MG/ML IJ SOLN: 10.0000 mg | INTRAMUSCULAR | Status: DC | PRN

## 2023-08-25 MED ORDER — ALBUTEROL SULFATE (2.5 MG/3ML) 0.083% IN NEBU: 2.5000 mg | INHALATION_SOLUTION | RESPIRATORY_TRACT | Status: DC | PRN

## 2023-08-25 MED ORDER — ONDANSETRON HCL 4 MG/2ML IJ SOLN: 4.0000 mg | Freq: Three times a day (TID) | INTRAMUSCULAR | Status: DC | PRN

## 2023-08-25 NOTE — ED Notes (Signed)
US at bedside

## 2023-08-25 NOTE — ED Provider Triage Note (Signed)
Emergency Medicine Provider Triage Evaluation Note  CRISTA DEAKIN , a 63 y.o. female  was evaluated in triage.  Pt complains of lower leg edema for past 2 days, it did improve yesterday, no SOB but does get SOB at times, .  Review of Systems  Positive:  Negative:   Physical Exam  BP (!) 147/58   Pulse 73   Temp 97.8 F (36.6 C)   Resp 18   Ht 5\' 2"  (1.575 m)   Wt 53.5 kg   SpO2 100%   BMI 21.58 kg/m  Gen:   Awake, no distress   Resp:  Normal effort  MSK:   Moves extremities without difficulty  Other:  Lower leg edema  Medical Decision Making  Medically screening exam initiated at 2:09 PM.  Appropriate orders placed.  Talayla S Troost was informed that the remainder of the evaluation will be completed by another provider, this initial triage assessment does not replace that evaluation, and the importance of remaining in the ED until their evaluation is complete.  Patient with history of renal failure not yet on dialysis having edema, VSS, NAD   Christen Bame, [place] 08/25/23 1411

## 2023-08-25 NOTE — H&P (Signed)
History and Physical    Alicia Rogers WJX:[id number] DOB: May 14, 1961 DOA: 08/25/2023  Referring MD/NP/PA:   PCP: Leanord Asal, Nelva Bush, MD   Patient coming from:  The patient is coming from SNF    Chief Complaint: leg edema and weakness  HPI: Alicia Rogers is a 63 y.o. female with medical history significant of CKD-V, HTN,  HLD, DM, COPD, gout, depression, schizophrenia, paranoid, migraine, anemia, tobacco abuse, who presents with leg edema and weakness.  Patient states that her leg edema has been progressively worsening recently.  The right leg is worse and had left.  Patient also has generalized weakness, fatigue, poor appetite.  She reports shortness of breath only on when laying flat.  She has mild dry cough, no chest pain, fever or chills.  No nausea, vomiting, diarrhea or abdominal pain.  No symptoms of UTI.  Patient has AV fistula placed it to the left arm on 07/14/2023, has not started dialysis yet.  Data reviewed independently and ED Course: pt was found to have WBC 8.9, potassium 5.7, renal function stable, BNP 155, temperature normal, blood pressure 171/65, heart rate 83, RR 18, oxygen saturation 97% on room air.  Lower extremity venous Doppler negative for DVT.  Chest x-ray showed cardiomegaly without infiltration.  Patient is placed on telemetry bed for observation.  Dr. Wynelle Link of renal is consulted by EDP.  Chest x-ray:  -Stable cardiomegaly and left pleural thickening. No active lung disease.   EKG: I have personally reviewed.  Sinus rhythm, QTc 421, nonspecific T wave change.   Review of Systems:   General: no fevers, chills, no body weight gain, has poor appetite, has fatigue HEENT: no blurry vision, hearing changes or sore throat Respiratory: has dyspnea, coughing, no wheezing CV: no chest pain, no palpitations GI: no nausea, vomiting, abdominal pain, diarrhea, constipation GU: no dysuria, burning on urination, increased urinary frequency, hematuria   Ext: has leg edema Neuro: no unilateral weakness, numbness, or tingling, no vision change or hearing loss Skin: no rash, no skin tear. MSK: No muscle spasm, no deformity, no limitation of range of movement in spin Heme: No easy bruising.  Travel history: No recent long distant travel.   Allergy:  Allergies  Allergen Reactions   Penicillins     Rash   Lisinopril Other (See Comments)    Cough     Past Medical History:  Diagnosis Date   Anemia    Aortic atherosclerosis (HCC)    Bilateral adrenal adenomas    CKD (chronic kidney disease), stage V (HCC)    Depression    Diabetes mellitus, type II (HCC)    Diverticulosis    Hand swelling 09/27/2021   Hypertension    Paranoid schizophrenia (HCC)    Proteinuria    Schizophrenia (HCC)     Past Surgical History:  Procedure Laterality Date   AV FISTULA PLACEMENT Left 07/14/2023   Procedure: ARTERIOVENOUS (AV) FISTULA CREATION;  Surgeon: Annice Needy, MD;  Location: [facility] ORS;  Service: Vascular;  Laterality: Left;   COLONOSCOPY WITH PROPOFOL N/A 07/21/2022   Procedure: COLONOSCOPY WITH PROPOFOL;  Surgeon: Toney Reil, MD;  Location: [facility] ENDOSCOPY;  Service: Gastroenterology;  Laterality: N/A;   colonscopy     ESOPHAGOGASTRODUODENOSCOPY (EGD) WITH PROPOFOL N/A 07/21/2022   Procedure: ESOPHAGOGASTRODUODENOSCOPY (EGD) WITH PROPOFOL;  Surgeon: Toney Reil, MD;  Location: [facility] ENDOSCOPY;  Service: Gastroenterology;  Laterality: N/A;   LOWER EXTREMITY ANGIOGRAPHY Right 07/08/2023   Procedure: Lower Extremity Angiography;  Surgeon: Festus Barren  S, MD;  Location: [facility] INVASIVE CV LAB;  Service: Cardiovascular;  Laterality: Right;    Social History:  reports that she has been smoking cigarettes. She started smoking about 45 years ago. She has a 45.3 pack-year smoking history. She has never used smokeless tobacco. She reports that she does not drink alcohol and does not use drugs.  Family History:  Family History  Problem  Relation Age of Onset   Diabetes Mother    Glaucoma Mother    Heart disease Mother    Heart attack Mother    Depression Mother    Hypertension Mother    Diabetes Father    Glaucoma Father    Hypertension Father    Prostate cancer Father    Diabetes Sister    Depression Sister    Post-traumatic stress disorder Sister      Prior to Admission medications   Medication Sig Start Date End Date Taking? Authorizing Provider  allopurinol (ZYLOPRIM) 100 MG tablet Take 100 mg by mouth daily. 04/06/23 04/05/24  [provider]  amLODipine (NORVASC) 10 MG tablet Take 1 tablet (10 mg total) by mouth daily. 05/17/23 05/16/24  Gillis Santa, MD  aspirin EC 81 MG tablet Take 81 mg by mouth daily. 11/19/21   [provider]  BREZTRI AEROSPHERE 160-9-4.8 MCG/ACT AERO Inhale 2 puffs into the lungs in the morning and at bedtime. Start taking after antibiotics are completed. 09/03/22 08/29/23  Miki Kins, FNP  [employer] (POLYOX WSR-301) POWD SMARTSIG:17 Gram(s) By Mouth Daily PRN 01/05/23   [provider]  buPROPion (WELLBUTRIN) 100 MG tablet Take 100 mg by mouth daily. 03/28/23   [provider]  carbamide peroxide (DEBROX) 6.5 % OTIC solution 5 drops 2 (two) times daily.    [provider]  cloNIDine (CATAPRES) 0.1 MG tablet Take 1 tablet (0.1 mg total) by mouth 3 (three) times daily. 05/16/23   Gillis Santa, MD  collagenase (SANTYL) 250 UNIT/GM ointment Apply topically daily. 05/17/23   Gillis Santa, MD  cyclobenzaprine (FLEXERIL) 10 MG tablet Take 1 tablet (10 mg total) by mouth at bedtime. Patient taking differently: Take 10 mg by mouth at bedtime as needed. 11/10/21   Sarina Ill, DO  ergocalciferol (VITAMIN D2) 1.25 MG (50000 UT) capsule Take 50,000 Units by mouth once a week. On Fridays    [provider]  escitalopram (LEXAPRO) 20 MG tablet Take 20 mg by mouth daily. 05/21/22   [provider]  feeding supplement (ENSURE  ENLIVE / ENSURE PLUS) LIQD Take 237 mLs by mouth 2 (two) times daily between meals. 05/16/23   Gillis Santa, MD  ferrous sulfate 325 (65 FE) MG tablet Take 325 mg by mouth 3 (three) times daily with meals. 12/07/21   [provider]  fluticasone (FLONASE) 50 MCG/ACT nasal spray Place 2 sprays into both nostrils daily. 09/03/22 09/03/23  Miki Kins, FNP  furosemide (LASIX) 20 MG tablet Take 1 tablet (20 mg total) by mouth every other day. 05/16/23   Gillis Santa, MD  gabapentin (NEURONTIN) 100 MG capsule Take 100 mg by mouth 3 (three) times daily. 01/10/22   [provider]  hydrALAZINE (APRESOLINE) 50 MG tablet Take 1 tablet (50 mg total) by mouth 3 (three) times daily. 05/16/23   Gillis Santa, MD  HYDROcodone-acetaminophen (NORCO/VICODIN) 5-325 MG tablet Take 2 tablets by mouth every 6 (six) hours as needed for moderate pain (pain score 4-6). 07/14/23 07/13/24  Annice Needy, MD  isosorbide mononitrate (IMDUR) 60 MG  24 hr tablet Take 1 tablet (60 mg total) by mouth at bedtime. Hold if SBP <130 05/16/23   Gillis Santa, MD  linagliptin (TRADJENTA) 5 MG TABS tablet Take 5 mg by mouth daily.    [provider]  lip balm (BLISTEX) OINT Apply 1 Application topically as needed for lip care. 05/16/23   Gillis Santa, MD  loperamide (IMODIUM) 2 MG capsule Take 2 mg by mouth 2 (two) times daily as needed for diarrhea or loose stools. 03/17/21   [provider]  melatonin 3 MG TABS tablet Take 2 tablets (6 mg total) by mouth at bedtime as needed. 05/16/23   Gillis Santa, MD  Multiple Vitamin (MULTIVITAMIN) capsule Take 1 capsule by mouth daily.    [provider]  nystatin (MYCOSTATIN) 100000 UNIT/ML suspension Take by mouth. 06/08/23   [provider]  omeprazole (PRILOSEC) 40 MG capsule Take 40 mg by mouth daily.    [provider]  Paliperidone ER (INVEGA SUSTENNA) injection Inject 78 mg into the muscle every 30 (thirty) days. 12/29/21    [provider]  polyethylene glycol powder (GLYCOLAX/MIRALAX) 17 GM/SCOOP powder Take 17 g by mouth daily as needed.    [provider]  Sodium Hypochlorite 0.057 % GEL Apply topically.    [provider]  sodium zirconium cyclosilicate (LOKELMA) 10 g PACK packet Take 10 g by mouth daily. 07/10/23   Wouk, Wilfred Curtis, MD  TYLENOL 325 MG tablet Take 650 mg by mouth every 8 (eight) hours as needed for mild pain. *DO NOT EXCEED 4GM OF TYLENOL IN 91 HOURS* 04/01/23   [provider]  Zinc Sulfate 220 (50 Zn) MG TABS Take by mouth.    [provider]    Physical Exam: Vitals:   08/25/23 1407 08/25/23 1831 08/25/23 2315 08/26/23 0030  BP:  (!) 171/65 (!) 169/118 (!) 171/56  Pulse:  83 81 79  Resp:  18 15 16   Temp:  98 F (36.7 C)    SpO2:  97% 100% 100%  Weight: 53.5 kg     Height: 5\' 2"  (1.575 m)      General: Not in acute distress HEENT:       Eyes: PERRL, EOMI, no jaundice       ENT: No discharge from the ears and nose, no pharynx injection, no tonsillar enlargement.        Neck: No JVD, no bruit, no mass felt. Heme: No neck lymph node enlargement. Cardiac: S1/S2, RRR, No murmurs, No gallops or rubs. Respiratory: No rales, wheezing, rhonchi or rubs. GI: Soft, nondistended, nontender, no rebound pain, no organomegaly, BS present. GU: No hematuria Ext: Has asymmetric leg edema, with 2+ right leg edema and 1+ left leg edema. 1+DP/PT pulse bilaterally. Musculoskeletal: No joint deformities, No joint redness or warmth, no limitation of ROM in spin. Skin: No rashes.  Neuro: Alert, oriented X3, cranial nerves II-XII grossly intact, moves all extremities normally. Psych: Patient is not psychotic, no suicidal or hemocidal ideation.  Labs on Admission: I have personally reviewed following labs and imaging studies  CBC: Recent Labs  Lab 08/25/23 1409  WBC 8.9  NEUTROABS 6.1  HGB 7.6*  HCT 26.3*  MCV 102.3*  PLT 368   Basic Metabolic  Panel: Recent Labs  Lab 08/25/23 1409  NA 143  K 5.7*  CL 116*  CO2 17*  GLUCOSE 141*  BUN 57*  CREATININE 2.81*  CALCIUM 9.1  MG 1.8   GFR: Estimated Creatinine Clearance: 16.2 mL/min (  A) (by C-G formula based on SCr of 2.81 mg/dL (H)). Liver Function Tests: Recent Labs  Lab 08/25/23 1409  AST 19  ALT 22  ALKPHOS 94  BILITOT 0.3  PROT 7.0  ALBUMIN 2.8*   No results for input(s): "LIPASE", "AMYLASE" in the last 168 hours. No results for input(s): "AMMONIA" in the last 168 hours. Coagulation Profile: No results for input(s): "INR", "PROTIME" in the last 168 hours. Cardiac Enzymes: No results for input(s): "CKTOTAL", "CKMB", "CKMBINDEX", "TROPONINI" in the last 168 hours. BNP (last 3 results) No results for input(s): "PROBNP" in the last 8760 hours. HbA1C: No results for input(s): "HGBA1C" in the last 72 hours. CBG: Recent Labs  Lab 08/25/23 2232  GLUCAP 96   Lipid Profile: No results for input(s): "CHOL", "HDL", "LDLCALC", "TRIG", "CHOLHDL", "LDLDIRECT" in the last 72 hours. Thyroid Function Tests: No results for input(s): "TSH", "T4TOTAL", "FREET4", "T3FREE", "THYROIDAB" in the last 72 hours. Anemia Panel: No results for input(s): "VITAMINB12", "FOLATE", "FERRITIN", "TIBC", "IRON", "RETICCTPCT" in the last 72 hours. Urine analysis:    Component Value Date/Time   COLORURINE YELLOW (A) 05/07/2023 2308   APPEARANCEUR CLEAR (A) 05/07/2023 2308   APPEARANCEUR Cloudy (A) 03/11/2020 1629   LABSPEC 1.008 05/07/2023 2308   PHURINE 7.0 05/07/2023 2308   GLUCOSEU >=500 (A) 05/07/2023 2308   HGBUR NEGATIVE 05/07/2023 2308   BILIRUBINUR NEGATIVE 05/07/2023 2308   BILIRUBINUR Negative 03/11/2020 1629   KETONESUR NEGATIVE 05/07/2023 2308   PROTEINUR >=300 (A) 05/07/2023 2308   NITRITE NEGATIVE 05/07/2023 2308   LEUKOCYTESUR TRACE (A) 05/07/2023 2308   Sepsis Labs: @LABRCNTIP (procalcitonin:4,lacticidven:4) )No results found for this or any previous visit (from the  past 240 hours).   Radiological Exams on Admission:   Assessment/Plan Principal Problem:   Hyperkalemia Active Problems:   Chronic kidney disease (CKD), stage V (HCC)   Type II diabetes mellitus with renal manifestations (HCC)   COPD (chronic obstructive pulmonary disease) (HCC)   Anemia in chronic kidney disease (CKD)   Essential hypertension   Schizoaffective disorder, bipolar type (HCC)   Tobacco use disorder   Assessment and Plan:   Hyperkalemia: K 5.7. No EKG T-wave peaking. -place in tele bed for obs - -treated with D50, 8 unit of Novolog by IV, 50 mEq of sodium bicarbonate plus 16.8 g of Patiromer  -f/u by BMP -will continue home leukoma 10 g daily tomorrow  Chronic kidney disease (CKD), stage V [facility]): Patient reports orthopnea, no oxygen desaturation.  No pulm edema chest x-ray.  Patient has bilateral leg edema. -ED physician consulted Dr. Wynelle Link of nephrology -Started IV Lasix 80 mg twice daily  Type II diabetes mellitus with renal manifestations [facility]): Recent A1c 7.6, poorly controlled.  Patient is taking Tradjenta -Sliding scale insulin  COPD (chronic obstructive pulmonary disease) (HCC): Stable -Blood culture and latest as needed Mucinex  Anemia in chronic kidney disease (CKD): Hemoglobin 7.6 which was 7.8 on 07/14/2023.  Hemoglobin stable. -Follow-up with CBC -Continue iron supplement  Essential hypertension -IV hydralazine as needed -Continue home medications: Amlodipine, clonidine, oral hydralazine, Imdur  Schizoaffective disorder, bipolar type [facility]): Patient is calm now.  No suicidal homicidal ideations. -Patient is getting paliperidone injection every 30 days -Continue Wellbutrin, Lexapro  Tobacco use disorder -Nicotine patch     DVT ppx: SQ Heparin       Code Status: Full code     Family Communication: not done, no family member is at bed side.        Disposition Plan:  Anticipate discharge  back to previous environment, SNF  Consults  called:  Dr. Wynelle Link of renal is consulted by EDP.  Admission status and Level of care: Telemetry Medical:    for obs as inpt        Dispo: The patient is from: SNF              Anticipated d/c is to: SNF              Anticipated d/c date is: 1 day              Patient currently is not medically stable to d/c.    Severity of Illness:  The appropriate patient status for this patient is OBSERVATION. Observation status is judged to be reasonable and necessary in order to provide the required intensity of service to ensure the patient's safety. The patient's presenting symptoms, physical exam findings, and initial radiographic and laboratory data in the context of their medical condition is felt to place them at decreased risk for further clinical deterioration. Furthermore, it is anticipated that the patient will be medically stable for discharge from the hospital within 2 midnights of admission.        Date of Service 08/26/2023    Lorretta Harp [facility]   If 7PM-7AM, please contact night-coverage [email or web address] 08/26/2023, 2:02 AM

## 2023-08-25 NOTE — ED Triage Notes (Signed)
[facility], states started getting better yesterday. Had fistula placed but has not started dialysis yet.

## 2023-08-25 NOTE — ED Triage Notes (Signed)
First Nurse Note: Patient to ED via [facility] from [facility] for edema- all over that started yesterday. Had dialysis fistula place in left arm but has not been used yet. Denies SOB.   90 HR 100% RA 150/100

## 2023-08-25 NOTE — ED Notes (Signed)
Dr Niu at bedside 

## 2023-08-25 NOTE — ED Provider Notes (Addendum)
[facility] Provider Note    Event Date/Time   First MD Initiated Contact with Patient 08/25/23 2051     (approximate)   History   Edema   HPI  Alicia Rogers is a 63 y.o. female non-insulin-dependent diabetes and chronic renal disease, schizophrenia, claudication, monoclonal gammopathy, anasarca, CKD stage V  Patient presents from her nursing facility for concerns of edema.  Currently readying for dialysis  She reports noticing trace swelling in both legs for about 2 weeks.  She is also had a cough for about 2 weeks.  No fevers or chills.  No pain no chest pain.  She has a fistula but has not yet started dialysis  She reports her primary nephrologist is Dr. Thedore Mins     Physical Exam   Triage Vital Signs: ED Triage Vitals  Encounter Vitals Group     BP 08/25/23 1406 (!) 147/58     Systolic BP Percentile --      Diastolic BP Percentile --      Pulse Rate 08/25/23 1406 73     Resp 08/25/23 1406 18     Temp 08/25/23 1406 97.8 F (36.6 C)     Temp src --      SpO2 08/25/23 1406 100 %     Weight 08/25/23 1407 118 lb (53.5 kg)     Height 08/25/23 1407 5\' 2"  (1.575 m)     Head Circumference --      [facility] Flow --      Pain Score 08/25/23 1407 6     Pain Loc --      Pain Education --      Exclude from Growth Chart --     Most recent vital signs: Vitals:   08/25/23 1406 08/25/23 1831  BP: (!) 147/58 (!) 171/65  Pulse: 73 83  Resp: 18 18  Temp: 97.8 F (36.6 C) 98 F (36.7 C)  SpO2: 100% 97%     General: Awake, no distress.  CV:  Good peripheral perfusion.  Normal tones Resp:  Normal effort.  Frequent somewhat productive cough.  Normal work of breathing.  Patient reports that a cough for about 2 weeks Abd:  No distention.  Nondistended no obvious anasarca no abdominal tenderness to palpation Other:  Moderate bilateral lower extremity edema right slightly greater than left.   ED Results / Procedures / Treatments   Labs (all labs  ordered are listed, but only abnormal results are displayed) Labs Reviewed  COMPREHENSIVE METABOLIC PANEL - Abnormal; Notable for the following components:      Result Value   Potassium 5.7 (*)    Chloride 116 (*)    CO2 17 (*)    Glucose, Bld 141 (*)    BUN 57 (*)    Creatinine, Ser 2.81 (*)    Albumin 2.8 (*)    GFR, Estimated 18 (*)    All other components within normal limits  CBC WITH DIFFERENTIAL/PLATELET - Abnormal; Notable for the following components:   RBC 2.57 (*)    Hemoglobin 7.6 (*)    HCT 26.3 (*)    MCV 102.3 (*)    MCHC 28.9 (*)    nRBC 0.6 (*)    All other components within normal limits  BRAIN NATRIURETIC PEPTIDE - Abnormal; Notable for the following components:   B Natriuretic Peptide 155.4 (*)    All other components within normal limits  MAGNESIUM  BASIC METABOLIC PANEL  CBC     EKG  And inter by me at 1410 heart rate 70 QRS 70 QTc 420 Normal sinus rhythm mild nonspecific T wave abnormality   RADIOLOGY  Chest x-ray inter by me is negative for acute finding  DG Chest 2 View Result Date: 08/25/2023 CLINICAL DATA:  Shortness of breath. Edema. Renal failure on dialysis. EXAM: CHEST - 2 VIEW COMPARISON:  05/07/2023 FINDINGS: Stable cardiomegaly. Mild left basilar pleural thickening is stable. No evidence of acute infiltrate or edema. IMPRESSION: Stable cardiomegaly and left pleural thickening. No active lung disease. Electronically Signed   By: Danae Orleans M.D.   On: 08/25/2023 15:08     Discussed with hospitalist, hospitalist to follow-up on pending lower extremity ultrasound  PROCEDURES:  Critical Care performed: No  Procedures   MEDICATIONS ORDERED IN ED: Medications  dextromethorphan-guaiFENesin (MUCINEX DM) 30-600 MG per 12 hr tablet 1 tablet (has no administration in time range)  ondansetron (ZOFRAN) injection 4 mg (has no administration in time range)  hydrALAZINE (APRESOLINE) injection 10 mg (has no administration in time range)   acetaminophen (TYLENOL) tablet 650 mg (has no administration in time range)  nicotine (NICODERM CQ - dosed in mg/24 hours) patch 21 mg (has no administration in time range)  insulin aspart (novoLOG) injection 0-9 Units (has no administration in time range)  insulin aspart (novoLOG) injection 0-5 Units (has no administration in time range)  patiromer Lelon Perla) packet 16.8 g (has no administration in time range)  insulin aspart (novoLOG) injection 8 Units (has no administration in time range)  dextrose 50 % solution 50 mL (has no administration in time range)  sodium bicarbonate injection 50 mEq (has no administration in time range)  albuterol (PROVENTIL) (2.5 MG/3ML) 0.083% nebulizer solution 2.5 mg (has no administration in time range)  heparin injection 5,000 Units (has no administration in time range)  furosemide (LASIX) injection 80 mg (has no administration in time range)     IMPRESSION / MDM / ASSESSMENT AND PLAN / ED COURSE  I reviewed the triage vital signs and the nursing notes.                              Differential diagnosis includes, but is not limited to, volume overload, advancing renal failure, electrolyte abnormality, hypoalbuminemia, nephropathy etc.  Patient's symptoms of 2 weeks of increasing lower extremity edema as well as her cough afebrile status normal white count seem to be suggestive of volume overload possibly with some very mild symptoms of pulmonary edema not apparent on chest x-ray.  BNP is elevated.  Renal function shows preserved creatinine from her baseline but hyperkalemia.  There are no findings of acute hyperkalemia on her ECG.  Because of the degree of her hyperkalemia with potassium 5.7 I consulted with Dr. Ronn Melena, given the patient is not currently on hemodialysis he recommends giving her IV furosemide for which she takes oral at her facility, and close observation with nephrology consult to follow in the morning.  He recommends observation to  facilitate formal consult and recheck potassium after diuresis  Patient's presentation is most consistent with acute complicated illness / injury requiring diagnostic workup.  Patient agreeable with plan for observation and nephrology consult.  Consulted with patient accepted to hospitalist by Dr. Clyde Lundborg        FINAL CLINICAL IMPRESSION(S) / ED DIAGNOSES   Final diagnoses:  Hyperkalemia  Stage 5 chronic kidney disease not on chronic dialysis (HCC)  Peripheral edema     Rx /  DC Orders   ED Discharge Orders     None        Note:  This document was prepared using Dragon voice recognition software and may include unintentional dictation errors.   Sharyn Creamer, MD 08/25/23 1610    Sharyn Creamer, MD 08/25/23 2236

## 2023-08-26 ENCOUNTER — Encounter (INDEPENDENT_AMBULATORY_CARE_PROVIDER_SITE_OTHER): Payer: 59

## 2023-08-26 ENCOUNTER — Ambulatory Visit (INDEPENDENT_AMBULATORY_CARE_PROVIDER_SITE_OTHER): Payer: 59 | Admitting: Nurse Practitioner

## 2023-08-26 DIAGNOSIS — I1311 Hypertensive heart and chronic kidney disease without heart failure, with stage 5 chronic kidney disease, or end stage renal disease: Secondary | ICD-10-CM | POA: Diagnosis present

## 2023-08-26 DIAGNOSIS — Z7982 Long term (current) use of aspirin: Secondary | ICD-10-CM | POA: Diagnosis not present

## 2023-08-26 DIAGNOSIS — Z818 Family history of other mental and behavioral disorders: Secondary | ICD-10-CM | POA: Diagnosis not present

## 2023-08-26 DIAGNOSIS — N185 Chronic kidney disease, stage 5: Secondary | ICD-10-CM | POA: Diagnosis present

## 2023-08-26 DIAGNOSIS — Z794 Long term (current) use of insulin: Secondary | ICD-10-CM | POA: Diagnosis not present

## 2023-08-26 DIAGNOSIS — Z8249 Family history of ischemic heart disease and other diseases of the circulatory system: Secondary | ICD-10-CM | POA: Diagnosis not present

## 2023-08-26 DIAGNOSIS — Z7984 Long term (current) use of oral hypoglycemic drugs: Secondary | ICD-10-CM | POA: Diagnosis not present

## 2023-08-26 DIAGNOSIS — N189 Chronic kidney disease, unspecified: Secondary | ICD-10-CM | POA: Diagnosis not present

## 2023-08-26 DIAGNOSIS — Z79899 Other long term (current) drug therapy: Secondary | ICD-10-CM | POA: Diagnosis not present

## 2023-08-26 DIAGNOSIS — D472 Monoclonal gammopathy: Secondary | ICD-10-CM | POA: Diagnosis present

## 2023-08-26 DIAGNOSIS — E1151 Type 2 diabetes mellitus with diabetic peripheral angiopathy without gangrene: Secondary | ICD-10-CM | POA: Diagnosis present

## 2023-08-26 DIAGNOSIS — Z83511 Family history of glaucoma: Secondary | ICD-10-CM | POA: Diagnosis not present

## 2023-08-26 DIAGNOSIS — D631 Anemia in chronic kidney disease: Secondary | ICD-10-CM | POA: Diagnosis present

## 2023-08-26 DIAGNOSIS — R109 Unspecified abdominal pain: Secondary | ICD-10-CM | POA: Diagnosis present

## 2023-08-26 DIAGNOSIS — N2581 Secondary hyperparathyroidism of renal origin: Secondary | ICD-10-CM | POA: Diagnosis present

## 2023-08-26 DIAGNOSIS — E8770 Fluid overload, unspecified: Secondary | ICD-10-CM | POA: Diagnosis present

## 2023-08-26 DIAGNOSIS — E785 Hyperlipidemia, unspecified: Secondary | ICD-10-CM | POA: Diagnosis present

## 2023-08-26 DIAGNOSIS — Z5329 Procedure and treatment not carried out because of patient's decision for other reasons: Secondary | ICD-10-CM | POA: Diagnosis present

## 2023-08-26 DIAGNOSIS — F17210 Nicotine dependence, cigarettes, uncomplicated: Secondary | ICD-10-CM | POA: Diagnosis present

## 2023-08-26 DIAGNOSIS — J449 Chronic obstructive pulmonary disease, unspecified: Secondary | ICD-10-CM | POA: Diagnosis present

## 2023-08-26 DIAGNOSIS — E876 Hypokalemia: Secondary | ICD-10-CM | POA: Diagnosis not present

## 2023-08-26 DIAGNOSIS — Z833 Family history of diabetes mellitus: Secondary | ICD-10-CM | POA: Diagnosis not present

## 2023-08-26 DIAGNOSIS — E1122 Type 2 diabetes mellitus with diabetic chronic kidney disease: Secondary | ICD-10-CM | POA: Diagnosis present

## 2023-08-26 DIAGNOSIS — F250 Schizoaffective disorder, bipolar type: Secondary | ICD-10-CM | POA: Diagnosis present

## 2023-08-26 DIAGNOSIS — Z8042 Family history of malignant neoplasm of prostate: Secondary | ICD-10-CM | POA: Diagnosis not present

## 2023-08-26 DIAGNOSIS — E875 Hyperkalemia: Secondary | ICD-10-CM | POA: Diagnosis present

## 2023-08-26 LAB — CBC
HCT: 23.9 % — ABNORMAL LOW (ref 36.0–46.0)
Hemoglobin: 7.3 g/dL — ABNORMAL LOW (ref 12.0–15.0)
MCH: 29.4 pg (ref 26.0–34.0)
MCHC: 30.5 g/dL (ref 30.0–36.0)
MCV: 96.4 fL (ref 80.0–100.0)
Platelets: 391 10*3/uL (ref 150–400)
RBC: 2.48 MIL/uL — ABNORMAL LOW (ref 3.87–5.11)
RDW: 14.8 % (ref 11.5–15.5)
WBC: 8.9 10*3/uL (ref 4.0–10.5)
nRBC: 0.4 % — ABNORMAL HIGH (ref 0.0–0.2)

## 2023-08-26 LAB — BASIC METABOLIC PANEL
Anion gap: 11 (ref 5–15)
BUN: 53 mg/dL — ABNORMAL HIGH (ref 8–23)
CO2: 18 mmol/L — ABNORMAL LOW (ref 22–32)
Calcium: 9.4 mg/dL (ref 8.9–10.3)
Chloride: 111 mmol/L (ref 98–111)
Creatinine, Ser: 2.67 mg/dL — ABNORMAL HIGH (ref 0.44–1.00)
GFR, Estimated: 19 mL/min — ABNORMAL LOW (ref >60)
Glucose, Bld: 102 mg/dL — ABNORMAL HIGH (ref 70–99)
Potassium: 5 mmol/L (ref 3.5–5.1)
Sodium: 140 mmol/L (ref 135–145)

## 2023-08-26 LAB — GLUCOSE, CAPILLARY
Glucose-Capillary: 88 mg/dL (ref 70–99)
Glucose-Capillary: 134 mg/dL — ABNORMAL HIGH (ref 70–99)
Glucose-Capillary: 93 mg/dL (ref 70–99)
Glucose-Capillary: 127 mg/dL — ABNORMAL HIGH (ref 70–99)

## 2023-08-26 MED ORDER — ISOSORBIDE MONONITRATE ER 30 MG PO TB24
60.0000 mg | ORAL_TABLET | Freq: Every day | ORAL | Status: DC
  Administered 2023-08-26 (×2): 60 mg via ORAL
  Filled 2023-08-26 (×2): qty 2

## 2023-08-26 MED ORDER — GABAPENTIN 100 MG PO CAPS
100.0000 mg | ORAL_CAPSULE | Freq: Three times a day (TID) | ORAL | Status: DC
  Administered 2023-08-26 – 2023-08-27 (×5): 100 mg via ORAL
  Filled 2023-08-26 (×5): qty 1

## 2023-08-26 MED ORDER — POLYETHYLENE GLYCOL 3350 17 GM/SCOOP PO POWD: 17.0000 g | Freq: Every day | ORAL | Status: DC | PRN

## 2023-08-26 MED ORDER — MELATONIN 5 MG PO TABS
5.0000 mg | ORAL_TABLET | Freq: Every evening | ORAL | Status: DC | PRN
  Administered 2023-08-26: 5 mg via ORAL
  Filled 2023-08-26: qty 1

## 2023-08-26 MED ORDER — CLONIDINE HCL 0.1 MG PO TABS
0.1000 mg | ORAL_TABLET | Freq: Three times a day (TID) | ORAL | Status: DC
  Administered 2023-08-26 – 2023-08-27 (×5): 0.1 mg via ORAL
  Filled 2023-08-26 (×5): qty 1

## 2023-08-26 MED ORDER — ESCITALOPRAM OXALATE 10 MG PO TABS
20.0000 mg | ORAL_TABLET | Freq: Every day | ORAL | Status: DC
  Administered 2023-08-26 – 2023-08-27 (×2): 20 mg via ORAL
  Filled 2023-08-26 (×2): qty 2

## 2023-08-26 MED ORDER — FLUTICASONE PROPIONATE 50 MCG/ACT NA SUSP
2.0000 | Freq: Every day | NASAL | Status: DC
  Administered 2023-08-27: 2 via NASAL
  Filled 2023-08-26: qty 16

## 2023-08-26 MED ORDER — HYDRALAZINE HCL 50 MG PO TABS
50.0000 mg | ORAL_TABLET | Freq: Three times a day (TID) | ORAL | Status: DC
  Administered 2023-08-26 – 2023-08-27 (×5): 50 mg via ORAL
  Filled 2023-08-26 (×5): qty 1

## 2023-08-26 MED ORDER — HYDROCODONE-ACETAMINOPHEN 5-325 MG PO TABS
2.0000 | ORAL_TABLET | Freq: Four times a day (QID) | ORAL | Status: DC | PRN
  Administered 2023-08-26 (×2): 2 via ORAL
  Filled 2023-08-26 (×2): qty 2

## 2023-08-26 MED ORDER — FERROUS SULFATE 325 (65 FE) MG PO TABS
325.0000 mg | ORAL_TABLET | Freq: Three times a day (TID) | ORAL | Status: DC
Start: 2023-08-26 — End: 2023-08-27
  Administered 2023-08-26 – 2023-08-27 (×5): 325 mg via ORAL
  Filled 2023-08-26 (×5): qty 1

## 2023-08-26 MED ORDER — AMLODIPINE BESYLATE 10 MG PO TABS
10.0000 mg | ORAL_TABLET | Freq: Every day | ORAL | Status: DC
  Administered 2023-08-26 – 2023-08-27 (×2): 10 mg via ORAL
  Filled 2023-08-26 (×2): qty 1

## 2023-08-26 MED ORDER — BUPROPION HCL 100 MG PO TABS
100.0000 mg | ORAL_TABLET | Freq: Every day | ORAL | Status: DC
  Administered 2023-08-26 – 2023-08-27 (×2): 100 mg via ORAL
  Filled 2023-08-26 (×2): qty 1

## 2023-08-26 MED ORDER — ASPIRIN 81 MG PO TBEC
81.0000 mg | DELAYED_RELEASE_TABLET | Freq: Every day | ORAL | Status: DC
  Administered 2023-08-26 – 2023-08-27 (×2): 81 mg via ORAL
  Filled 2023-08-26 (×2): qty 1

## 2023-08-26 MED ORDER — ALLOPURINOL 100 MG PO TABS
100.0000 mg | ORAL_TABLET | Freq: Every day | ORAL | Status: DC
  Administered 2023-08-26 – 2023-08-27 (×2): 100 mg via ORAL
  Filled 2023-08-26 (×2): qty 1

## 2023-08-26 MED ORDER — SODIUM ZIRCONIUM CYCLOSILICATE 10 G PO PACK
10.0000 g | PACK | Freq: Every day | ORAL | Status: DC
  Administered 2023-08-27: 10 g via ORAL
  Filled 2023-08-26 (×2): qty 1

## 2023-08-26 MED ORDER — PANTOPRAZOLE SODIUM 40 MG PO TBEC
40.0000 mg | DELAYED_RELEASE_TABLET | Freq: Every day | ORAL | Status: DC
  Administered 2023-08-26 – 2023-08-27 (×2): 40 mg via ORAL
  Filled 2023-08-26 (×2): qty 1

## 2023-08-26 MED ORDER — LOPERAMIDE HCL 2 MG PO CAPS: 2.0000 mg | ORAL_CAPSULE | Freq: Two times a day (BID) | ORAL | Status: DC | PRN

## 2023-08-26 NOTE — Progress Notes (Signed)
[facility]  ROUNDING NOTE   Subjective:   Alicia Rogers is a 63 y.o. female with past medical history of schizophrenia, diabetes mellitus type II, hypertension, tobacco use, hyperlipidemia and chronic kidney disease stage 4. She presents to the ED with and has been admitted for lower extremity edema and has been admitted under observation for Hyperkalemia [E87.5] Peripheral edema [R60.0] Stage 5 chronic kidney disease not on chronic dialysis [facility]) [N18.5]  Patient is known to our practice and is followed outpatient by Dr. Thedore Mins.  Patient was last seen in office on 07/04/2023 for routine follow-up.  Patient states her leg edema has worsened gradually.  She is a current resident of a nursing facility.  Unaware of medications.  Does report a dry cough but denies known fever or chills.  Denies recent sick contacts.  Labs on ED arrival concerning for potassium 5.7, serum bicarb 17, BUN 57, creatinine 2.81 with GFR 18, BNP 1554, and hemoglobin 7.6.  Shifting measures received for elevated potassium, 5.0 today.  Patient remains at baseline creatinine.  Lower extremity ultrasound negative for DVT.  Chest x-ray negative for pulmonary edema.   Objective:  Vital signs in last 24 hours:  Temp:  [97.8 F (36.6 C)-98.6 F (37 C)] 98.6 F (37 C) (01/24 0800) Pulse Rate:  [73-91] 88 (01/24 0800) Resp:  [15-20] 16 (01/24 0800) BP: (147-180)/(52-118) 162/59 (01/24 0800) SpO2:  [94 %-100 %] 96 % (01/24 0800) Weight:  [53.5 kg-55.5 kg] 55.5 kg (01/24 0300)  Weight change:  Filed Weights   08/25/23 1407 08/26/23 0300  Weight: 53.5 kg 55.5 kg    Intake/Output: I/O last 3 completed shifts: In: 500 [P.O.:500] Out: -    Intake/Output this shift:  No intake/output data recorded.  Physical Exam: General: NAD  Head: Normocephalic, atraumatic. Moist oral mucosal membranes  Eyes: Anicteric  Lungs:  Clear to auscultation, normal effort  Heart: Regular rate and rhythm  Abdomen:   Soft, nontender, nondistended  Extremities: 2+ peripheral edema.  Neurologic: Alert, moving all four extremities  Skin: No lesions  Access: Left aVF (maturing)    Basic Metabolic Panel: Recent Labs  Lab 08/25/23 1409 08/26/23 0641  NA 143 140  K 5.7* 5.0  CL 116* 111  CO2 17* 18*  GLUCOSE 141* 102*  BUN 57* 53*  CREATININE 2.81* 2.67*  CALCIUM 9.1 9.4  MG 1.8  --     Liver Function Tests: Recent Labs  Lab 08/25/23 1409  AST 19  ALT 22  ALKPHOS 94  BILITOT 0.3  PROT 7.0  ALBUMIN 2.8*   No results for input(s): "LIPASE", "AMYLASE" in the last 168 hours. No results for input(s): "AMMONIA" in the last 168 hours.  CBC: Recent Labs  Lab 08/25/23 1409 08/26/23 0641  WBC 8.9 8.9  NEUTROABS 6.1  --   HGB 7.6* 7.3*  HCT 26.3* 23.9*  MCV 102.3* 96.4  PLT 368 391    Cardiac Enzymes: No results for input(s): "CKTOTAL", "CKMB", "CKMBINDEX", "TROPONINI" in the last 168 hours.  BNP: Invalid input(s): "POCBNP"  CBG: Recent Labs  Lab 08/25/23 2232 08/26/23 0802  GLUCAP 96 88    Microbiology: Results for orders placed or performed during the hospital encounter of 05/07/23  Resp panel by RT-PCR (RSV, Flu A&B, Covid) Anterior Nasal Swab     Status: None   Collection Time: 05/07/23 10:26 PM   Specimen: Anterior Nasal Swab  Result Value Ref Range Status   SARS Coronavirus 2 by RT PCR NEGATIVE NEGATIVE Final  Comment: (NOTE) SARS-CoV-2 target nucleic acids are NOT DETECTED.  The SARS-CoV-2 RNA is generally detectable in upper respiratory specimens during the acute phase of infection. The lowest concentration of SARS-CoV-2 viral copies this assay can detect is 138 copies/mL. A negative result does not preclude SARS-Cov-2 infection and should not be used as the sole basis for treatment or other patient management decisions. A negative result may occur with  improper specimen collection/handling, submission of specimen other than nasopharyngeal swab, presence  of viral mutation(s) within the areas targeted by this assay, and inadequate number of viral copies(<138 copies/mL). A negative result must be combined with clinical observations, patient history, and epidemiological information. The expected result is Negative.  Fact Sheet for Patients:  BloggerCourse.com  Fact Sheet for Healthcare Providers:  SeriousBroker.it  This test is no t yet approved or cleared by the [place] FDA and  has been authorized for detection and/or diagnosis of SARS-CoV-2 by FDA under an Emergency Use Authorization (EUA). This EUA will remain  in effect (meaning this test can be used) for the duration of the COVID-19 declaration under Section 564(b)(1) of the Act, 21 U.S.C.section 360bbb-3(b)(1), unless the authorization is terminated  or revoked sooner.       Influenza A by PCR NEGATIVE NEGATIVE Final   Influenza B by PCR NEGATIVE NEGATIVE Final    Comment: (NOTE) The Xpert Xpress SARS-CoV-2/FLU/RSV plus assay is intended as an aid in the diagnosis of influenza from Nasopharyngeal swab specimens and should not be used as a sole basis for treatment. Nasal washings and aspirates are unacceptable for Xpert Xpress SARS-CoV-2/FLU/RSV testing.  Fact Sheet for Patients: BloggerCourse.com  Fact Sheet for Healthcare Providers: SeriousBroker.it  This test is not yet approved or cleared by the [place] FDA and has been authorized for detection and/or diagnosis of SARS-CoV-2 by FDA under an Emergency Use Authorization (EUA). This EUA will remain in effect (meaning this test can be used) for the duration of the COVID-19 declaration under Section 564(b)(1) of the Act, 21 U.S.C. section 360bbb-3(b)(1), unless the authorization is terminated or revoked.     Resp Syncytial Virus by PCR NEGATIVE NEGATIVE Final    Comment: (NOTE) Fact Sheet for  Patients: BloggerCourse.com  Fact Sheet for Healthcare Providers: SeriousBroker.it  This test is not yet approved or cleared by the [place] FDA and has been authorized for detection and/or diagnosis of SARS-CoV-2 by FDA under an Emergency Use Authorization (EUA). This EUA will remain in effect (meaning this test can be used) for the duration of the COVID-19 declaration under Section 564(b)(1) of the Act, 21 U.S.C. section 360bbb-3(b)(1), unless the authorization is terminated or revoked.  Performed at [facility], [address] [place], [place] [address]   Aerobic/Anaerobic Culture w Gram Stain (surgical/deep wound)     Status: None   Collection Time: 05/09/23 11:30 AM   Specimen: Wound  Result Value Ref Range Status   Specimen Description   Final    WOUND Performed at [facility], [address], [place], [place] [address]    Special Requests   Final    LEFT HIP Performed at [facility], [address] [place], [place] [address]    Gram Stain   Final    RARE WBC PRESENT, PREDOMINANTLY PMN MODERATE GRAM NEGATIVE RODS MODERATE GRAM POSITIVE COCCI IN PAIRS    Culture   Final    ABUNDANT STAPHYLOCOCCUS AUREUS ABUNDANT STREPTOCOCCUS MITIS/ORALIS ABUNDANT BACTEROIDES FRAGILIS BETA LACTAMASE POSITIVE Performed at [facility], [address], [place], [place] [address]    Report Status 05/12/2023 FINAL  Final   Organism ID, Bacteria STAPHYLOCOCCUS AUREUS  Final   Organism ID, Bacteria STREPTOCOCCUS MITIS/ORALIS  Final      Susceptibility   Staphylococcus aureus - MIC*    CIPROFLOXACIN <=0.5 SENSITIVE Sensitive     ERYTHROMYCIN 4 INTERMEDIATE Intermediate     GENTAMICIN <=0.5 SENSITIVE Sensitive     OXACILLIN <=0.25 SENSITIVE Sensitive     TETRACYCLINE <=1 SENSITIVE Sensitive     VANCOMYCIN 1 SENSITIVE Sensitive     TRIMETH/SULFA <=10 SENSITIVE Sensitive     CLINDAMYCIN  <=0.25 SENSITIVE Sensitive     RIFAMPIN <=0.5 SENSITIVE Sensitive     Inducible Clindamycin NEGATIVE Sensitive     LINEZOLID 2 SENSITIVE Sensitive     * ABUNDANT STAPHYLOCOCCUS AUREUS   Streptococcus mitis/oralis - MIC*    TETRACYCLINE >=16 RESISTANT Resistant     VANCOMYCIN 0.5 SENSITIVE Sensitive     CLINDAMYCIN >=1 RESISTANT Resistant     * ABUNDANT STREPTOCOCCUS MITIS/ORALIS    Coagulation Studies: No results for input(s): "LABPROT", "INR" in the last 72 hours.  Urinalysis: No results for input(s): "COLORURINE", "LABSPEC", "PHURINE", "GLUCOSEU", "HGBUR", "BILIRUBINUR", "KETONESUR", "PROTEINUR", "UROBILINOGEN", "NITRITE", "LEUKOCYTESUR" in the last 72 hours.  Invalid input(s): "APPERANCEUR"    Imaging: US Venous Img Lower Bilateral Result Date: 08/25/2023 CLINICAL DATA:  Bilateral lower extremity edema EXAM: BILATERAL LOWER EXTREMITY VENOUS DOPPLER ULTRASOUND TECHNIQUE: Gray-scale sonography with compression, as well as color and duplex ultrasound, were performed to evaluate the deep venous system(s) from the level of the common femoral vein through the popliteal and proximal calf veins. COMPARISON:  02/13/2023 FINDINGS: VENOUS Normal compressibility of the common femoral, superficial femoral, and popliteal veins, as well as the visualized calf veins. Visualized portions of profunda femoral vein and great saphenous vein unremarkable. No filling defects to suggest DVT on grayscale or color Doppler imaging. Doppler waveforms show normal direction of venous flow, normal respiratory plasticity and response to augmentation. OTHER None. Limitations: none IMPRESSION: 1. No evidence of deep venous thrombosis within either lower extremity. Electronically Signed   By: Sharlet Salina M.D.   On: 08/25/2023 22:40   DG Chest 2 View Result Date: 08/25/2023 CLINICAL DATA:  Shortness of breath. Edema. Renal failure on dialysis. EXAM: CHEST - 2 VIEW COMPARISON:  05/07/2023 FINDINGS: Stable cardiomegaly.  Mild left basilar pleural thickening is stable. No evidence of acute infiltrate or edema. IMPRESSION: Stable cardiomegaly and left pleural thickening. No active lung disease. Electronically Signed   By: Danae Orleans M.D.   On: 08/25/2023 15:08     Medications:     allopurinol  100 mg Oral Daily   amLODipine  10 mg Oral Daily   aspirin EC  81 mg Oral Daily   buPROPion  100 mg Oral Daily   cloNIDine  0.1 mg Oral TID   escitalopram  20 mg Oral Daily   ferrous sulfate  325 mg Oral TID WC   fluticasone  2 spray Each Nare Daily   furosemide  80 mg Intravenous Q12H   gabapentin  100 mg Oral TID   heparin  5,000 Units Subcutaneous Q8H   hydrALAZINE  50 mg Oral TID   insulin aspart  0-5 Units Subcutaneous QHS   insulin aspart  0-9 Units Subcutaneous TID WC   isosorbide mononitrate  60 mg Oral QHS   nicotine  21 mg Transdermal Daily   pantoprazole  40 mg Oral Daily   [START ON  08/27/2023] sodium zirconium cyclosilicate  10 g Oral Daily   acetaminophen, albuterol, dextromethorphan-guaiFENesin, hydrALAZINE, HYDROcodone-acetaminophen, loperamide, melatonin, ondansetron (ZOFRAN) IV, polyethylene glycol powder  Assessment/ Plan:  Ms. KIZZI OVERBEY is a 63 y.o.  female with past medical history of schizophrenia, diabetes mellitus type II, hypertension, tobacco use, hyperlipidemia and chronic kidney disease stage 4. She presents to the ED with and has been admitted for lower extremity edema and has been admitted under observation for Hyperkalemia [E87.5] Peripheral edema [R60.0] Stage 5 chronic kidney disease not on chronic dialysis (HCC) [N18.5]   Chronic kidney disease stage IV with baseline creatinine 2.8 and GFR of 18 on 07/04/23.  Chronic kidney disease is secondary to type 2 diabetes, hypertension, and aortic arthrosclerosis.  Renal function remains at baseline.  Will continue to monitor with increased diuresis.   Lab Results  Component Value Date   CREATININE 2.67 (H) 08/26/2023    CREATININE 2.81 (H) 08/25/2023   CREATININE 4.30 (H) 07/14/2023    Intake/Output Summary (Last 24 hours) at 08/26/2023 1039 Last data filed at 08/26/2023 0400 Gross per 24 hour  Intake 500 ml  Output --  Net 500 ml    2.  Hyperkalemia, potassium 5.7 on ED arrival.  Patient prescribed Lokelma 10 g daily outpatient.  Received shifting measures in emergency department.  Continue daily Lokelma.  3.  Fluid volume overload, bilateral lower extremities.  Chest x-ray negative for pulmonary edema.  Home diuretic regimen of 40 mg of furosemide every other day.  Patient currently prescribed IV furosemide 80 mg twice daily.  Will monitor.  4. Anemia of chronic kidney disease Lab Results  Component Value Date   HGB 7.3 (L) 08/26/2023    Hemoglobin below desired target.  Patient reports no signs of melena or hematuria.  5. Secondary Hyperparathyroidism: with outpatient labs: PTH 46, phosphorus 4.3, calcium 9.1 on 07/04/23.   Lab Results  Component Value Date   CALCIUM 9.4 08/26/2023   CAION 1.29 07/14/2023   PHOS 4.2 05/13/2023    Will continue to monitor bone minerals during this admission.  Patient receives ergocalciferol outpatient.   LOS: 0 Gorje Iyer 1/24/202510:39 AM

## 2023-08-26 NOTE — Plan of Care (Signed)

## 2023-08-26 NOTE — Hospital Course (Addendum)
Hospital course / significant events:   HPI:  Alicia Rogers is a 63 y.o. female with medical history significant of CKD-V, HTN,  HLD, DM, COPD, gout, depression, schizophrenia, paranoid, migraine, anemia, tobacco abuse, who presents with leg edema and weakness.   01/23: admitted for hyperkalemia 5.7 in setting of CKD5, EDP consulted nephrology. LE Doppler negative for DVT.  Reproted orthopnea, no oxygen desaturation.  Lasix 80 mg twice daily. 01/24: hyperkalemia resolved. Per nephrology, continue w/ lasix, recheck K and renal fxn labs in AM, no dialysis at this time  01/25: Cr stable but slightly higher. Pending 24h Creatinine clearance per nephrology     Consultants:  nephrology  Procedures/Surgeries: none      ASSESSMENT & PLAN:   Hyperkalemia - resolved K 5.7 on admission. No EKG T-wave peaking. S/p treated with D50, 8 unit of Novolog by IV, 50 mEq of sodium bicarbonate plus 16.8 g of Patiromer  f/u by BMP continue home lokelma 10 g daily    Chronic kidney disease (CKD), stage IV  Pending 24h creatinine clearance   Edema IV Lasix 80 mg twice daily to continue nephro   Type II diabetes mellitus with renal manifestations  Recent A1c 7.6, decently controlled.  Patient is taking Tradjenta Sliding scale insulin   COPD (chronic obstructive pulmonary disease)  Stable as needed Mucinex, albuterol   Anemia in chronic kidney disease (CKD) Hemoglobin 7.6 which was 7.8 on 07/14/2023.  Hemoglobin stable. Follow CBC Continue iron supplement   Essential hypertension IV hydralazine as needed Continue home medications: Amlodipine, clonidine, oral hydralazine, Imdur   Schizoaffective disorder, bipolar type Patient is calm now.  No suicidal homicidal ideations. Patient is getting paliperidone injection every 30 days Continue Wellbutrin, Lexapro   Tobacco use disorder Nicotine patch    DVT prophylaxis: heparin IV fluids: no continuous IV fluids  Nutrition:  cardiac/carb diet Central lines / invasive devices: none  Code Status: FULL CODE ACP documentation reviewed:  none on file in [facility]  TOC needs: TBD Barriers to dispo / significant pending items: monitoring on IV lasix

## 2023-08-26 NOTE — Progress Notes (Signed)
PROGRESS NOTE    Alicia Rogers   ZOX:[id number] DOB: 10/26/1960  DOA: 08/25/2023 Date of Service: 08/26/23 which is hospital day 0  PCP: Leanord Asal, Nelva Bush, MD    Hospital course / significant events:   HPI:  Alicia Rogers is a 63 y.o. female with medical history significant of CKD-V, HTN,  HLD, DM, COPD, gout, depression, schizophrenia, paranoid, migraine, anemia, tobacco abuse, who presents with leg edema and weakness.   01/23: admitted for hyperkalemia 5.7 in setting of CKD5, EDP consulted nephrology. LE Doppler negative for DVT.  Reproted orthopnea, no oxygen desaturation.  Lasix 80 mg twice daily. 01/24: hyperkalemia resolved. Per nephrology, continue w/ lasix, recheck K and renal fxn labs in AM, no dialysis at this time      Consultants:  nephrology  Procedures/Surgeries: none      ASSESSMENT & PLAN:   Hyperkalemia - resolved K 5.7 on admission. No EKG T-wave peaking. S/p treated with D50, 8 unit of Novolog by IV, 50 mEq of sodium bicarbonate plus 16.8 g of Patiromer  f/u by BMP continue home lokelma 10 g daily    Chronic kidney disease (CKD), stage V  Patient reports orthopnea, no oxygen desaturation.  No pulm edema chest x-ray.  Patient has bilateral leg edema. IV Lasix 80 mg twice daily to continue nephro   Type II diabetes mellitus with renal manifestations  Recent A1c 7.6, decently controlled.  Patient is taking Tradjenta Sliding scale insulin   COPD (chronic obstructive pulmonary disease)  Stable as needed Mucinex, albuterol   Anemia in chronic kidney disease (CKD) Hemoglobin 7.6 which was 7.8 on 07/14/2023.  Hemoglobin stable. Follow CBC Continue iron supplement   Essential hypertension IV hydralazine as needed Continue home medications: Amlodipine, clonidine, oral hydralazine, Imdur   Schizoaffective disorder, bipolar type Patient is calm now.  No suicidal homicidal ideations. Patient is getting paliperidone injection every 30  days Continue Wellbutrin, Lexapro   Tobacco use disorder Nicotine patch    DVT prophylaxis: heparin IV fluids: no continuous IV fluids  Nutrition: cardiac/carb diet Central lines / invasive devices: none  Code Status: FULL CODE ACP documentation reviewed:  none on file in [facility]  TOC needs: TBD Barriers to dispo / significant pending items: monitoring on IV lasix              Subjective / Brief ROS:  Patient reports swelling is a bit better  No orthopnea at this time  Denies CP/SOB.  Pain controlled.  Denies new weakness.  Tolerating diet.  Reports no concerns w/ urination/defecation.   Family Communication: none at this time     Objective Findings:  Vitals:   08/26/23 0230 08/26/23 0300 08/26/23 0310 08/26/23 0800  BP: (!) 176/64  (!) 180/52 (!) 162/59  Pulse: 77  91 88  Resp: 16  20 16   Temp:   98.4 F (36.9 C) 98.6 F (37 C)  TempSrc:   Oral Oral  SpO2: 97%  98% 96%  Weight:  55.5 kg    Height:  5\' 2"  (1.575 m)      Intake/Output Summary (Last 24 hours) at 08/26/2023 1518 Last data filed at 08/26/2023 1500 Gross per 24 hour  Intake 860 ml  Output --  Net 860 ml   Filed Weights   08/25/23 1407 08/26/23 0300  Weight: 53.5 kg 55.5 kg    Examination:  Physical Exam Constitutional:      General: She is not in acute distress. Cardiovascular:     Rate  and Rhythm: Normal rate and regular rhythm.  Pulmonary:     Effort: Pulmonary effort is normal.     Breath sounds: Normal breath sounds.  Abdominal:     Palpations: Abdomen is soft.  Musculoskeletal:     Right lower leg: Edema (trace to +1) present.     Left lower leg: Edema (trace to +1) present.  Neurological:     Mental Status: She is alert.  Psychiatric:        Mood and Affect: Mood normal.        Behavior: Behavior normal.          Scheduled Medications:   allopurinol  100 mg Oral Daily   amLODipine  10 mg Oral Daily   aspirin EC  81 mg Oral Daily   buPROPion  100 mg  Oral Daily   cloNIDine  0.1 mg Oral TID   escitalopram  20 mg Oral Daily   ferrous sulfate  325 mg Oral TID WC   fluticasone  2 spray Each Nare Daily   furosemide  80 mg Intravenous Q12H   gabapentin  100 mg Oral TID   heparin  5,000 Units Subcutaneous Q8H   hydrALAZINE  50 mg Oral TID   insulin aspart  0-5 Units Subcutaneous QHS   insulin aspart  0-9 Units Subcutaneous TID WC   isosorbide mononitrate  60 mg Oral QHS   nicotine  21 mg Transdermal Daily   pantoprazole  40 mg Oral Daily   [START ON 08/27/2023] sodium zirconium cyclosilicate  10 g Oral Daily    Continuous Infusions:   PRN Medications:  acetaminophen, albuterol, dextromethorphan-guaiFENesin, hydrALAZINE, HYDROcodone-acetaminophen, loperamide, melatonin, ondansetron (ZOFRAN) IV, polyethylene glycol powder  Antimicrobials from admission:  Anti-infectives (From admission, onward)    None           Data Reviewed:  I have personally reviewed the following...  CBC: Recent Labs  Lab 08/25/23 1409 08/26/23 0641  WBC 8.9 8.9  NEUTROABS 6.1  --   HGB 7.6* 7.3*  HCT 26.3* 23.9*  MCV 102.3* 96.4  PLT 368 391   Basic Metabolic Panel: Recent Labs  Lab 08/25/23 1409 08/26/23 0641  NA 143 140  K 5.7* 5.0  CL 116* 111  CO2 17* 18*  GLUCOSE 141* 102*  BUN 57* 53*  CREATININE 2.81* 2.67*  CALCIUM 9.1 9.4  MG 1.8  --    GFR: Estimated Creatinine Clearance: 17.1 mL/min (A) (by C-G formula based on SCr of 2.67 mg/dL (H)). Liver Function Tests: Recent Labs  Lab 08/25/23 1409  AST 19  ALT 22  ALKPHOS 94  BILITOT 0.3  PROT 7.0  ALBUMIN 2.8*   No results for input(s): "LIPASE", "AMYLASE" in the last 168 hours. No results for input(s): "AMMONIA" in the last 168 hours. Coagulation Profile: No results for input(s): "INR", "PROTIME" in the last 168 hours. Cardiac Enzymes: No results for input(s): "CKTOTAL", "CKMB", "CKMBINDEX", "TROPONINI" in the last 168 hours. BNP (last 3 results) No results for  input(s): "PROBNP" in the last 8760 hours. HbA1C: No results for input(s): "HGBA1C" in the last 72 hours. CBG: Recent Labs  Lab 08/25/23 2232 08/26/23 0802 08/26/23 1146  GLUCAP 96 88 134*   Lipid Profile: No results for input(s): "CHOL", "HDL", "LDLCALC", "TRIG", "CHOLHDL", "LDLDIRECT" in the last 72 hours. Thyroid Function Tests: No results for input(s): "TSH", "T4TOTAL", "FREET4", "T3FREE", "THYROIDAB" in the last 72 hours. Anemia Panel: No results for input(s): "VITAMINB12", "FOLATE", "FERRITIN", "TIBC", "IRON", "RETICCTPCT" in the last 72  hours. Most Recent Urinalysis On File:     Component Value Date/Time   COLORURINE YELLOW (A) 05/07/2023 2308   APPEARANCEUR CLEAR (A) 05/07/2023 2308   APPEARANCEUR Cloudy (A) 03/11/2020 1629   LABSPEC 1.008 05/07/2023 2308   PHURINE 7.0 05/07/2023 2308   GLUCOSEU >=500 (A) 05/07/2023 2308   HGBUR NEGATIVE 05/07/2023 2308   BILIRUBINUR NEGATIVE 05/07/2023 2308   BILIRUBINUR Negative 03/11/2020 1629   KETONESUR NEGATIVE 05/07/2023 2308   PROTEINUR >=300 (A) 05/07/2023 2308   NITRITE NEGATIVE 05/07/2023 2308   LEUKOCYTESUR TRACE (A) 05/07/2023 2308   Sepsis Labs: @LABRCNTIP (procalcitonin:4,lacticidven:4) Microbiology: No results found for this or any previous visit (from the past 240 hours).    Radiology Studies last 3 days: US Venous Img Lower Bilateral Result Date: 08/25/2023 CLINICAL DATA:  Bilateral lower extremity edema EXAM: BILATERAL LOWER EXTREMITY VENOUS DOPPLER ULTRASOUND TECHNIQUE: Gray-scale sonography with compression, as well as color and duplex ultrasound, were performed to evaluate the deep venous system(s) from the level of the common femoral vein through the popliteal and proximal calf veins. COMPARISON:  02/13/2023 FINDINGS: VENOUS Normal compressibility of the common femoral, superficial femoral, and popliteal veins, as well as the visualized calf veins. Visualized portions of profunda femoral vein and great saphenous  vein unremarkable. No filling defects to suggest DVT on grayscale or color Doppler imaging. Doppler waveforms show normal direction of venous flow, normal respiratory plasticity and response to augmentation. OTHER None. Limitations: none IMPRESSION: 1. No evidence of deep venous thrombosis within either lower extremity. Electronically Signed   By: Sharlet Salina M.D.   On: 08/25/2023 22:40   DG Chest 2 View Result Date: 08/25/2023 CLINICAL DATA:  Shortness of breath. Edema. Renal failure on dialysis. EXAM: CHEST - 2 VIEW COMPARISON:  05/07/2023 FINDINGS: Stable cardiomegaly. Mild left basilar pleural thickening is stable. No evidence of acute infiltrate or edema. IMPRESSION: Stable cardiomegaly and left pleural thickening. No active lung disease. Electronically Signed   By: Danae Orleans M.D.   On: 08/25/2023 15:08        Sunnie Nielsen, DO [facility] 08/26/2023, 3:18 PM    Dictation software may have been used to generate the above note. Typos may occur and escape review in typed/dictated notes. Please contact Dr Lyn Hollingshead directly for clarity if needed.  Staff may message me via secure chat in Epic  but this may not receive an immediate response,  please page me for urgent matters!  If 7PM-7AM, please contact night coverage [email or web address]

## 2023-08-27 ENCOUNTER — Other Ambulatory Visit: Payer: Self-pay

## 2023-08-27 ENCOUNTER — Emergency Department
Admission: EM | Admit: 2023-08-27 | Discharge: 2023-08-28 | Disposition: A | Discharge status: Home / Self Care | Payer: 59 | Attending: Emergency Medicine | Admitting: Emergency Medicine

## 2023-08-27 DIAGNOSIS — E876 Hypokalemia: Secondary | ICD-10-CM | POA: Insufficient documentation

## 2023-08-27 DIAGNOSIS — N189 Chronic kidney disease, unspecified: Secondary | ICD-10-CM | POA: Insufficient documentation

## 2023-08-27 DIAGNOSIS — E875 Hyperkalemia: Secondary | ICD-10-CM | POA: Diagnosis not present

## 2023-08-27 DIAGNOSIS — R109 Unspecified abdominal pain: Secondary | ICD-10-CM | POA: Diagnosis present

## 2023-08-27 LAB — CBC WITH DIFFERENTIAL/PLATELET
Abs Immature Granulocytes: 0.06 10*3/uL (ref 0.00–0.07)
Basophils Absolute: 0 10*3/uL (ref 0.0–0.1)
Basophils Relative: 1 %
Eosinophils Absolute: 0.2 10*3/uL (ref 0.0–0.5)
Eosinophils Relative: 2 %
HCT: 26.6 % — ABNORMAL LOW (ref 36.0–46.0)
Hemoglobin: 8.1 g/dL — ABNORMAL LOW (ref 12.0–15.0)
Immature Granulocytes: 1 %
Lymphocytes Relative: 30 %
Lymphs Abs: 2.6 10*3/uL (ref 0.7–4.0)
MCH: 29.5 pg (ref 26.0–34.0)
MCHC: 30.5 g/dL (ref 30.0–36.0)
MCV: 96.7 fL (ref 80.0–100.0)
Monocytes Absolute: 1.1 10*3/uL — ABNORMAL HIGH (ref 0.1–1.0)
Monocytes Relative: 12 %
Neutro Abs: 4.8 10*3/uL (ref 1.7–7.7)
Neutrophils Relative %: 54 %
Platelets: 403 10*3/uL — ABNORMAL HIGH (ref 150–400)
RBC: 2.75 MIL/uL — ABNORMAL LOW (ref 3.87–5.11)
RDW: 14.6 % (ref 11.5–15.5)
WBC: 8.6 10*3/uL (ref 4.0–10.5)
nRBC: 0.6 % — ABNORMAL HIGH (ref 0.0–0.2)

## 2023-08-27 LAB — BASIC METABOLIC PANEL
Anion gap: 13 (ref 5–15)
Anion gap: 14 (ref 5–15)
BUN: 56 mg/dL — ABNORMAL HIGH (ref 8–23)
BUN: 63 mg/dL — ABNORMAL HIGH (ref 8–23)
CO2: 19 mmol/L — ABNORMAL LOW (ref 22–32)
CO2: 19 mmol/L — ABNORMAL LOW (ref 22–32)
Calcium: 9.3 mg/dL (ref 8.9–10.3)
Calcium: 9.1 mg/dL (ref 8.9–10.3)
Chloride: 108 mmol/L (ref 98–111)
Chloride: 106 mmol/L (ref 98–111)
Creatinine, Ser: 3.1 mg/dL — ABNORMAL HIGH (ref 0.44–1.00)
Creatinine, Ser: 3.41 mg/dL — ABNORMAL HIGH (ref 0.44–1.00)
GFR, Estimated: 16 mL/min — ABNORMAL LOW (ref >60)
GFR, Estimated: 15 mL/min — ABNORMAL LOW (ref >60)
Glucose, Bld: 119 mg/dL — ABNORMAL HIGH (ref 70–99)
Glucose, Bld: 151 mg/dL — ABNORMAL HIGH (ref 70–99)
Potassium: 5 mmol/L (ref 3.5–5.1)
Potassium: 4.5 mmol/L (ref 3.5–5.1)
Sodium: 140 mmol/L (ref 135–145)
Sodium: 139 mmol/L (ref 135–145)

## 2023-08-27 LAB — HEMOGLOBIN AND HEMATOCRIT, BLOOD
HCT: 24.2 % — ABNORMAL LOW (ref 36.0–46.0)
Hemoglobin: 7.7 g/dL — ABNORMAL LOW (ref 12.0–15.0)

## 2023-08-27 LAB — GLUCOSE, CAPILLARY
Glucose-Capillary: 109 mg/dL — ABNORMAL HIGH (ref 70–99)
Glucose-Capillary: 114 mg/dL — ABNORMAL HIGH (ref 70–99)
Glucose-Capillary: 128 mg/dL — ABNORMAL HIGH (ref 70–99)

## 2023-08-27 NOTE — ED Provider Triage Note (Signed)
Emergency Medicine Provider Triage Evaluation Note  Alicia Rogers , a 63 y.o. female  was evaluated in triage.  Pt left AMA from hospitalist service, but [facility] wont accept her back until she is discharged. Patient reports she has bed sores that were painful, and she wasn't getting better which is why she says she left.  Review of Systems  Positive: "Bed sores" Negative: fever  Physical Exam  BP (!) 136/54 (BP Location: Right Arm)   Pulse 84   Temp 97.9 F (36.6 C) (Oral)   Resp 15   Ht 5\' 2"  (1.575 m)   Wt 55.3 kg   BMI 22.31 kg/m  Gen:   Awake, no distress   Resp:  Normal effort  MSK:   Moves extremities without difficulty  Other:    Medical Decision Making  Medically screening exam initiated at 8:23 PM.  Appropriate orders placed.  Alicia Rogers was informed that the remainder of the evaluation will be completed by another provider, this initial triage assessment does not replace that evaluation, and the importance of remaining in the ED until their evaluation is complete.     Jackelyn Hoehn, PA-C 08/27/23 2024

## 2023-08-27 NOTE — Progress Notes (Signed)
Alerted by RN that pt wanting to leave AMA  I came to bedside. I told patient if she leaves now, we are missing diagnostic information that will help [place] care for her, and she might get sicker and we can't help her as well as we could if she'd stayed. She asks if she can come back if she leaves, I told her she is always welcome to seek evaluation in the ED if needed but I cannot bring her directly back to a medical floor if she signs out AMA. I asked her to repeat back to me so I know she understands - she states she knows if she leaves we might not be able to help her if she's sicker, and if she feels worse she can come back to the ER. Patient has capacity to understand risks/benefits of staying/going.

## 2023-08-27 NOTE — ED Triage Notes (Signed)
Pt reports she is here because she returned home to [facility] tonight after leaving here AMA around 1830 but states [facility] told her she could not come back there until she was discharged from the hospital. She was admitted for leg edema, weakness and hyperkalemia. The patient currently reports she feels fine and has no complaints.

## 2023-08-27 NOTE — Progress Notes (Signed)
PROGRESS NOTE    Alicia Rogers   WUJ:[id number] DOB: 01-03-1961  DOA: 08/25/2023 Date of Service: 08/27/23 which is hospital day 1  PCP: Leanord Asal, Nelva Bush, MD    Hospital course / significant events:   HPI:  Alicia Rogers is a 63 y.o. female with medical history significant of CKD-V, HTN,  HLD, DM, COPD, gout, depression, schizophrenia, paranoid, migraine, anemia, tobacco abuse, who presents with leg edema and weakness.   01/23: admitted for hyperkalemia 5.7 in setting of CKD5, EDP consulted nephrology. LE Doppler negative for DVT.  Reproted orthopnea, no oxygen desaturation.  Lasix 80 mg twice daily. 01/24: hyperkalemia resolved. Per nephrology, continue w/ lasix, recheck K and renal fxn labs in AM, no dialysis at this time  01/25: Cr stable but slightly higher. Pending 24h Creatinine clearance per nephrology     Consultants:  nephrology  Procedures/Surgeries: none      ASSESSMENT & PLAN:   Hyperkalemia - resolved K 5.7 on admission. No EKG T-wave peaking. S/p treated with D50, 8 unit of Novolog by IV, 50 mEq of sodium bicarbonate plus 16.8 g of Patiromer  f/u by BMP continue home lokelma 10 g daily    Chronic kidney disease (CKD), stage IV  Pending 24h creatinine clearance   Edema IV Lasix 80 mg twice daily to continue nephro   Type II diabetes mellitus with renal manifestations  Recent A1c 7.6, decently controlled.  Patient is taking Tradjenta Sliding scale insulin   COPD (chronic obstructive pulmonary disease)  Stable as needed Mucinex, albuterol   Anemia in chronic kidney disease (CKD) Hemoglobin 7.6 which was 7.8 on 07/14/2023.  Hemoglobin stable. Follow CBC Continue iron supplement   Essential hypertension IV hydralazine as needed Continue home medications: Amlodipine, clonidine, oral hydralazine, Imdur   Schizoaffective disorder, bipolar type Patient is calm now.  No suicidal homicidal ideations. Patient is getting paliperidone  injection every 30 days Continue Wellbutrin, Lexapro   Tobacco use disorder Nicotine patch    DVT prophylaxis: heparin IV fluids: no continuous IV fluids  Nutrition: cardiac/carb diet Central lines / invasive devices: none  Code Status: FULL CODE ACP documentation reviewed:  none on file in [facility]  TOC needs: TBD Barriers to dispo / significant pending items: monitoring on IV lasix              Subjective / Brief ROS:  Patient reports swelling is a bit better  No orthopnea at this time  Denies CP/SOB.  Pain controlled.  Denies new weakness.  Tolerating diet.  Reports no concerns w/ urination/defecation.   Family Communication: none at this time     Objective Findings:  Vitals:   08/26/23 1714 08/26/23 2022 08/27/23 0521 08/27/23 0808  BP: (!) 150/58 (!) 146/51 (!) 133/59 (!) 153/59  Pulse: 83 85 72 76  Resp: 16 18 19 16   Temp: 98.1 F (36.7 C) 98.3 F (36.8 C) 98.2 F (36.8 C) 98.3 F (36.8 C)  TempSrc: Oral Oral Oral Oral  SpO2: 99% 97% 100% 100%  Weight:      Height:        Intake/Output Summary (Last 24 hours) at 08/27/2023 1628 Last data filed at 08/26/2023 2030 Gross per 24 hour  Intake 100 ml  Output 200 ml  Net -100 ml   Filed Weights   08/25/23 1407 08/26/23 0300  Weight: 53.5 kg 55.5 kg    Examination:  Physical Exam Constitutional:      General: She is not in acute distress. Cardiovascular:  Rate and Rhythm: Normal rate and regular rhythm.  Pulmonary:     Effort: Pulmonary effort is normal.     Breath sounds: Normal breath sounds.  Abdominal:     Palpations: Abdomen is soft.  Musculoskeletal:     Right lower leg: Edema (trace) present.     Left lower leg: Edema (trace) present.  Neurological:     Mental Status: She is alert.  Psychiatric:        Mood and Affect: Mood normal.        Behavior: Behavior normal.          Scheduled Medications:   allopurinol  100 mg Oral Daily   amLODipine  10 mg Oral Daily    aspirin EC  81 mg Oral Daily   buPROPion  100 mg Oral Daily   cloNIDine  0.1 mg Oral TID   escitalopram  20 mg Oral Daily   ferrous sulfate  325 mg Oral TID WC   fluticasone  2 spray Each Nare Daily   furosemide  80 mg Intravenous Q12H   gabapentin  100 mg Oral TID   heparin  5,000 Units Subcutaneous Q8H   hydrALAZINE  50 mg Oral TID   insulin aspart  0-5 Units Subcutaneous QHS   insulin aspart  0-9 Units Subcutaneous TID WC   isosorbide mononitrate  60 mg Oral QHS   nicotine  21 mg Transdermal Daily   pantoprazole  40 mg Oral Daily   sodium zirconium cyclosilicate  10 g Oral Daily    Continuous Infusions:   PRN Medications:  acetaminophen, albuterol, dextromethorphan-guaiFENesin, hydrALAZINE, HYDROcodone-acetaminophen, loperamide, melatonin, ondansetron (ZOFRAN) IV, polyethylene glycol powder  Antimicrobials from admission:  Anti-infectives (From admission, onward)    None           Data Reviewed:  I have personally reviewed the following...  CBC: Recent Labs  Lab 08/25/23 1409 08/26/23 0641 08/27/23 0422  WBC 8.9 8.9  --   NEUTROABS 6.1  --   --   HGB 7.6* 7.3* 7.7*  HCT 26.3* 23.9* 24.2*  MCV 102.3* 96.4  --   PLT 368 391  --    Basic Metabolic Panel: Recent Labs  Lab 08/25/23 1409 08/26/23 0641 08/27/23 0422  NA 143 140 140  K 5.7* 5.0 5.0  CL 116* 111 108  CO2 17* 18* 19*  GLUCOSE 141* 102* 119*  BUN 57* 53* 56*  CREATININE 2.81* 2.67* 3.10*  CALCIUM 9.1 9.4 9.3  MG 1.8  --   --    GFR: Estimated Creatinine Clearance: 14.7 mL/min (A) (by C-G formula based on SCr of 3.1 mg/dL (H)). Liver Function Tests: Recent Labs  Lab 08/25/23 1409  AST 19  ALT 22  ALKPHOS 94  BILITOT 0.3  PROT 7.0  ALBUMIN 2.8*   No results for input(s): "LIPASE", "AMYLASE" in the last 168 hours. No results for input(s): "AMMONIA" in the last 168 hours. Coagulation Profile: No results for input(s): "INR", "PROTIME" in the last 168 hours. Cardiac Enzymes: No  results for input(s): "CKTOTAL", "CKMB", "CKMBINDEX", "TROPONINI" in the last 168 hours. BNP (last 3 results) No results for input(s): "PROBNP" in the last 8760 hours. HbA1C: No results for input(s): "HGBA1C" in the last 72 hours. CBG: Recent Labs  Lab 08/26/23 1732 08/26/23 2107 08/27/23 0810 08/27/23 1137 08/27/23 1558  GLUCAP 93 127* 109* 114* 128*   Lipid Profile: No results for input(s): "CHOL", "HDL", "LDLCALC", "TRIG", "CHOLHDL", "LDLDIRECT" in the last 72 hours. Thyroid Function Tests: No  results for input(s): "TSH", "T4TOTAL", "FREET4", "T3FREE", "THYROIDAB" in the last 72 hours. Anemia Panel: No results for input(s): "VITAMINB12", "FOLATE", "FERRITIN", "TIBC", "IRON", "RETICCTPCT" in the last 72 hours. Most Recent Urinalysis On File:     Component Value Date/Time   COLORURINE YELLOW (A) 05/07/2023 2308   APPEARANCEUR CLEAR (A) 05/07/2023 2308   APPEARANCEUR Cloudy (A) 03/11/2020 1629   LABSPEC 1.008 05/07/2023 2308   PHURINE 7.0 05/07/2023 2308   GLUCOSEU >=500 (A) 05/07/2023 2308   HGBUR NEGATIVE 05/07/2023 2308   BILIRUBINUR NEGATIVE 05/07/2023 2308   BILIRUBINUR Negative 03/11/2020 1629   KETONESUR NEGATIVE 05/07/2023 2308   PROTEINUR >=300 (A) 05/07/2023 2308   NITRITE NEGATIVE 05/07/2023 2308   LEUKOCYTESUR TRACE (A) 05/07/2023 2308   Sepsis Labs: @LABRCNTIP (procalcitonin:4,lacticidven:4) Microbiology: No results found for this or any previous visit (from the past 240 hours).    Radiology Studies last 3 days: US Venous Img Lower Bilateral Result Date: 08/25/2023 CLINICAL DATA:  Bilateral lower extremity edema EXAM: BILATERAL LOWER EXTREMITY VENOUS DOPPLER ULTRASOUND TECHNIQUE: Gray-scale sonography with compression, as well as color and duplex ultrasound, were performed to evaluate the deep venous system(s) from the level of the common femoral vein through the popliteal and proximal calf veins. COMPARISON:  02/13/2023 FINDINGS: VENOUS Normal  compressibility of the common femoral, superficial femoral, and popliteal veins, as well as the visualized calf veins. Visualized portions of profunda femoral vein and great saphenous vein unremarkable. No filling defects to suggest DVT on grayscale or color Doppler imaging. Doppler waveforms show normal direction of venous flow, normal respiratory plasticity and response to augmentation. OTHER None. Limitations: none IMPRESSION: 1. No evidence of deep venous thrombosis within either lower extremity. Electronically Signed   By: Sharlet Salina M.D.   On: 08/25/2023 22:40   DG Chest 2 View Result Date: 08/25/2023 CLINICAL DATA:  Shortness of breath. Edema. Renal failure on dialysis. EXAM: CHEST - 2 VIEW COMPARISON:  05/07/2023 FINDINGS: Stable cardiomegaly. Mild left basilar pleural thickening is stable. No evidence of acute infiltrate or edema. IMPRESSION: Stable cardiomegaly and left pleural thickening. No active lung disease. Electronically Signed   By: Danae Orleans M.D.   On: 08/25/2023 15:08        Sunnie Nielsen, DO [facility] 08/27/2023, 4:28 PM    Dictation software may have been used to generate the above note. Typos may occur and escape review in typed/dictated notes. Please contact Dr Lyn Hollingshead directly for clarity if needed.  Staff may message me via secure chat in Epic  but this may not receive an immediate response,  please page me for urgent matters!  If 7PM-7AM, please contact night coverage [email or web address]

## 2023-08-27 NOTE — ED Provider Notes (Signed)
[facility] Provider Note    Event Date/Time   First MD Initiated Contact with Patient 08/27/23 2328     (approximate)   History   No Complaints   HPI Mahogany S Bellerose is a 63 y.o. female with a history of chronic kidney disease not yet on dialysis but with plans to be (nephrologist is Dr. Thedore Mins).  She lives at [facility].  She presents tonight because she left the hospital AGAINST MEDICAL ADVICE earlier in the day and [facility] refused to allow her to come back until she was officially discharged from the hospital.  She states that the problem for which she was admitted, swelling in her legs, has gotten better.  She denies any pain or shortness of breath.  She says she is still urinating normally for her.  She is able to eat or drink and has no nausea or vomiting.  She denies any pain at this time including chest pain and abdominal pain.     Physical Exam   Triage Vital Signs: ED Triage Vitals  Encounter Vitals Group     BP 08/27/23 2016 (!) 136/54     Systolic BP Percentile --      Diastolic BP Percentile --      Pulse Rate 08/27/23 2018 84     Resp 08/27/23 2016 15     Temp 08/27/23 2016 97.9 F (36.6 C)     Temp Source 08/27/23 2016 Oral     SpO2 08/27/23 2026 100 %     Weight 08/27/23 2015 55.3 kg (122 lb)     Height 08/27/23 2015 1.575 m (5\' 2" )     Head Circumference --      [facility] --      Pain Score 08/27/23 2015 0     Pain Loc --      Pain Education --      Exclude from Growth Chart --     Most recent vital signs: Vitals:   08/27/23 2018 08/27/23 2026  BP:    Pulse: 84   Resp:    Temp:    SpO2:  100%    General: Awake, no distress.  CV:  Good peripheral perfusion.  Regular rate and rhythm. Resp:  Normal effort. Speaking easily and comfortably, no accessory muscle usage nor intercostal retractions.   Abd:  No distention.  No tenderness to palpation of the abdomen. Other:  No peripheral edema.   ED Results /  Procedures / Treatments   Labs (all labs ordered are listed, but only abnormal results are displayed) Labs Reviewed  BASIC METABOLIC PANEL - Abnormal; Notable for the following components:      Result Value   CO2 19 (*)    Glucose, Bld 151 (*)    BUN 63 (*)    Creatinine, Ser 3.41 (*)    GFR, Estimated 15 (*)    All other components within normal limits  CBC WITH DIFFERENTIAL/PLATELET - Abnormal; Notable for the following components:   RBC 2.75 (*)    Hemoglobin 8.1 (*)    HCT 26.6 (*)    Platelets 403 (*)    nRBC 0.6 (*)    Monocytes Absolute 1.1 (*)    All other components within normal limits     EKG  ED ECG REPORT I, Loleta Rose, the attending physician, personally viewed and interpreted this ECG.  Date: 08/27/2023 EKG Time: 20: 36 Rate: 76 Rhythm: normal sinus rhythm QRS Axis: normal Intervals: Short PR  interval at 106 ms ST/T Wave abnormalities: Non-specific ST segment / T-wave changes, but no clear evidence of acute ischemia. Narrative Interpretation: no definitive evidence of acute ischemia; does not meet STEMI criteria.    PROCEDURES:  Critical Care performed: No  Procedures    IMPRESSION / MDM / ASSESSMENT AND PLAN / ED COURSE  I reviewed the triage vital signs and the nursing notes.                              Differential diagnosis includes, but is not limited to, electrolyte abnormality, renal dysfunction.  Patient's presentation is most consistent with acute presentation with potential threat to life or bodily function.  Labs/studies ordered: EKG, CBC with differential, basic metabolic panel  Interventions/Medications given:  Medications - No data to display  (Note:  hospital course my include additional interventions and/or labs/studies not listed above.)   I reviewed the patient's recent notes from her hospitalization.  She was hospitalized 2 days ago on 08/25/2023 for peripheral edema and anasarca in the setting of chronic kidney  disease with plans for hemodialysis.  Of note she was also hyperkalemic requiring intervention since she has not yet on dialysis.  She was hospitalized for about 2 days and had improvement of her symptoms after diuresis.  However she became frustrated earlier today and let her treatment team know that she wanted to leave AGAINST MEDICAL ADVICE and called a taxi.  The hospitalist working with her, Dr. Lyn Hollingshead, spoke with her and explained the risk and benefits and the patient left.  Fortunately, both her blood work obtained prior to her departure from the hospital and her blood work tonight was reassuring.  She is no longer hyperkalemic.  Although she continues to have chronic kidney disease, her kidney function has not changed significantly with a creatinine of 3.4.  Her hemoglobin is low at 8.1 but it is better than it has been in the past (it is typically in the sevens).  The patient is asymptomatic.  I explained to her that I would not need to admit her if I was seeing her as a new visit even if she had not recently been admitted.  There is no indication for hospitalization or additional treatment at this time.  She is medically cleared for discharge and outpatient follow-up as previously scheduled.  She understands and agrees with the plan.       FINAL CLINICAL IMPRESSION(S) / ED DIAGNOSES   Final diagnoses:  Chronic kidney disease, unspecified CKD stage     Rx / DC Orders   ED Discharge Orders     None        Note:  This document was prepared using Dragon voice recognition software and may include unintentional dictation errors.   Loleta Rose, MD 08/27/23 2356

## 2023-08-27 NOTE — Progress Notes (Signed)
Patient stated she wanted to leave AMA. Educated patient on the risks of leaving AMA. Pt stated she called a taxi and they would be here shortly. Pt is alert and oriented x4. Notified Dr Lyn Hollingshead via epic chat and she stated she would be up to speak with the patient. Pt spoke to Dr Lyn Hollingshead at bedside and was able to verbalize the risks and that she can come back to the ER if she needs to. Pt's IV removed, tip intact and hemostasis achieved.Pt's vape returned to her. Pt states she has all her possessions at this time.  Pt escorted to the medical mall by this RN .

## 2023-08-27 NOTE — Discharge Instructions (Addendum)
Your evaluation tonight was reassuring.  Although you left from the hospital on 1/25 without being officially discharged by the hospitalist team, you were appropriately and adequately treated while in the hospital, and you do not need to be readmitted to the hospital tonight.  Please continue with your regular scheduled medications and follow-up with your primary care doctor and your nephrologist, Dr. Thedore Mins, as previously scheduled.    Return to the emergency department if you develop new or worsening symptoms that concern you.

## 2023-08-27 NOTE — Plan of Care (Signed)
Problem: Coping: Goal: Ability to adjust to condition or change in health will improve Outcome: Progressing

## 2023-08-27 NOTE — Progress Notes (Signed)
[facility]  ROUNDING NOTE   Subjective:   Tayva S Hults is a 63 y.o. female with past medical history of schizophrenia, diabetes mellitus type II, hypertension, tobacco use, hyperlipidemia and chronic kidney disease stage 4. She presents to the ED with and has been admitted for lower extremity edema and has been admitted under observation for Hyperkalemia [E87.5] Peripheral edema [R60.0] Stage 5 chronic kidney disease not on chronic dialysis [facility]) [N18.5]  Patient is known to our practice and is followed outpatient by Dr. Thedore Mins.  Patient was last seen in office on 07/04/2023 for routine follow-up.   Patient seen sitting up in bed Alert, remains on room air Lower extremity edema greatly improved.    Objective:  Vital signs in last 24 hours:  Temp:  [98.1 F (36.7 C)-98.3 F (36.8 C)] 98.3 F (36.8 C) (01/25 0808) Pulse Rate:  [72-85] 76 (01/25 0808) Resp:  [16-19] 16 (01/25 0808) BP: (133-153)/(51-59) 153/59 (01/25 0808) SpO2:  [97 %-100 %] 100 % (01/25 0808)  Weight change:  Filed Weights   08/25/23 1407 08/26/23 0300  Weight: 53.5 kg 55.5 kg    Intake/Output: I/O last 3 completed shifts: In: 960 [P.O.:960] Out: 200 [Urine:200]   Intake/Output this shift:  No intake/output data recorded.  Physical Exam: General: NAD  Head: Normocephalic, atraumatic. Moist oral mucosal membranes  Eyes: Anicteric  Lungs:  Clear to auscultation, normal effort  Heart: Regular rate and rhythm  Abdomen:  Soft, nontender, nondistended  Extremities: Trace - 1+ peripheral edema.  Neurologic: Alert, moving all four extremities  Skin: No lesions  Access: Left aVF (maturing)    Basic Metabolic Panel: Recent Labs  Lab 08/25/23 1409 08/26/23 0641 08/27/23 0422  NA 143 140 140  K 5.7* 5.0 5.0  CL 116* 111 108  CO2 17* 18* 19*  GLUCOSE 141* 102* 119*  BUN 57* 53* 56*  CREATININE 2.81* 2.67* 3.10*  CALCIUM 9.1 9.4 9.3  MG 1.8  --   --     Liver Function  Tests: Recent Labs  Lab 08/25/23 1409  AST 19  ALT 22  ALKPHOS 94  BILITOT 0.3  PROT 7.0  ALBUMIN 2.8*   No results for input(s): "LIPASE", "AMYLASE" in the last 168 hours. No results for input(s): "AMMONIA" in the last 168 hours.  CBC: Recent Labs  Lab 08/25/23 1409 08/26/23 0641 08/27/23 0422  WBC 8.9 8.9  --   NEUTROABS 6.1  --   --   HGB 7.6* 7.3* 7.7*  HCT 26.3* 23.9* 24.2*  MCV 102.3* 96.4  --   PLT 368 391  --     Cardiac Enzymes: No results for input(s): "CKTOTAL", "CKMB", "CKMBINDEX", "TROPONINI" in the last 168 hours.  BNP: Invalid input(s): "POCBNP"  CBG: Recent Labs  Lab 08/26/23 1146 08/26/23 1732 08/26/23 2107 08/27/23 0810 08/27/23 1137  GLUCAP 134* 93 127* 109* 114*    Microbiology: Results for orders placed or performed during the hospital encounter of 05/07/23  Resp panel by RT-PCR (RSV, Flu A&B, Covid) Anterior Nasal Swab     Status: None   Collection Time: 05/07/23 10:26 PM   Specimen: Anterior Nasal Swab  Result Value Ref Range Status   SARS Coronavirus 2 by RT PCR NEGATIVE NEGATIVE Final    Comment: (NOTE) SARS-CoV-2 target nucleic acids are NOT DETECTED.  The SARS-CoV-2 RNA is generally detectable in upper respiratory specimens during the acute phase of infection. The lowest concentration of SARS-CoV-2 viral copies this assay can detect is 138 copies/mL.  A negative result does not preclude SARS-Cov-2 infection and should not be used as the sole basis for treatment or other patient management decisions. A negative result may occur with  improper specimen collection/handling, submission of specimen other than nasopharyngeal swab, presence of viral mutation(s) within the areas targeted by this assay, and inadequate number of viral copies(<138 copies/mL). A negative result must be combined with clinical observations, patient history, and epidemiological information. The expected result is Negative.  Fact Sheet for Patients:   BloggerCourse.com  Fact Sheet for Healthcare Providers:  SeriousBroker.it  This test is no t yet approved or cleared by the [place] FDA and  has been authorized for detection and/or diagnosis of SARS-CoV-2 by FDA under an Emergency Use Authorization (EUA). This EUA will remain  in effect (meaning this test can be used) for the duration of the COVID-19 declaration under Section 564(b)(1) of the Act, 21 U.S.C.section 360bbb-3(b)(1), unless the authorization is terminated  or revoked sooner.       Influenza A by PCR NEGATIVE NEGATIVE Final   Influenza B by PCR NEGATIVE NEGATIVE Final    Comment: (NOTE) The Xpert Xpress SARS-CoV-2/FLU/RSV plus assay is intended as an aid in the diagnosis of influenza from Nasopharyngeal swab specimens and should not be used as a sole basis for treatment. Nasal washings and aspirates are unacceptable for Xpert Xpress SARS-CoV-2/FLU/RSV testing.  Fact Sheet for Patients: BloggerCourse.com  Fact Sheet for Healthcare Providers: SeriousBroker.it  This test is not yet approved or cleared by the [place] FDA and has been authorized for detection and/or diagnosis of SARS-CoV-2 by FDA under an Emergency Use Authorization (EUA). This EUA will remain in effect (meaning this test can be used) for the duration of the COVID-19 declaration under Section 564(b)(1) of the Act, 21 U.S.C. section 360bbb-3(b)(1), unless the authorization is terminated or revoked.     Resp Syncytial Virus by PCR NEGATIVE NEGATIVE Final    Comment: (NOTE) Fact Sheet for Patients: BloggerCourse.com  Fact Sheet for Healthcare Providers: SeriousBroker.it  This test is not yet approved or cleared by the [place] FDA and has been authorized for detection and/or diagnosis of SARS-CoV-2 by FDA under an Emergency Use  Authorization (EUA). This EUA will remain in effect (meaning this test can be used) for the duration of the COVID-19 declaration under Section 564(b)(1) of the Act, 21 U.S.C. section 360bbb-3(b)(1), unless the authorization is terminated or revoked.  Performed at [facility], [address], [place], [place] [address]   Aerobic/Anaerobic Culture w Gram Stain (surgical/deep wound)     Status: None   Collection Time: 05/09/23 11:30 AM   Specimen: Wound  Result Value Ref Range Status   Specimen Description   Final    WOUND Performed at [facility], [address], [place], [place] [address]    Special Requests   Final    LEFT HIP Performed at [facility], [address] [place], [place] [address]    Gram Stain   Final    RARE WBC PRESENT, PREDOMINANTLY PMN MODERATE GRAM NEGATIVE RODS MODERATE GRAM POSITIVE COCCI IN PAIRS    Culture   Final    ABUNDANT STAPHYLOCOCCUS AUREUS ABUNDANT STREPTOCOCCUS MITIS/ORALIS ABUNDANT BACTEROIDES FRAGILIS BETA LACTAMASE POSITIVE Performed at [facility], [address] [place], [place] [address]    Report Status 05/12/2023 FINAL  Final   Organism ID, Bacteria STAPHYLOCOCCUS AUREUS  Final   Organism ID, Bacteria STREPTOCOCCUS MITIS/ORALIS  Final  Susceptibility   Staphylococcus aureus - MIC*    CIPROFLOXACIN <=0.5 SENSITIVE Sensitive     ERYTHROMYCIN 4 INTERMEDIATE Intermediate     GENTAMICIN <=0.5 SENSITIVE Sensitive     OXACILLIN <=0.25 SENSITIVE Sensitive     TETRACYCLINE <=1 SENSITIVE Sensitive     VANCOMYCIN 1 SENSITIVE Sensitive     TRIMETH/SULFA <=10 SENSITIVE Sensitive     CLINDAMYCIN <=0.25 SENSITIVE Sensitive     RIFAMPIN <=0.5 SENSITIVE Sensitive     Inducible Clindamycin NEGATIVE Sensitive     LINEZOLID 2 SENSITIVE Sensitive     * ABUNDANT STAPHYLOCOCCUS AUREUS   Streptococcus mitis/oralis - MIC*    TETRACYCLINE >=16 RESISTANT Resistant     VANCOMYCIN 0.5 SENSITIVE Sensitive      CLINDAMYCIN >=1 RESISTANT Resistant     * ABUNDANT STREPTOCOCCUS MITIS/ORALIS    Coagulation Studies: No results for input(s): "LABPROT", "INR" in the last 72 hours.  Urinalysis: No results for input(s): "COLORURINE", "LABSPEC", "PHURINE", "GLUCOSEU", "HGBUR", "BILIRUBINUR", "KETONESUR", "PROTEINUR", "UROBILINOGEN", "NITRITE", "LEUKOCYTESUR" in the last 72 hours.  Invalid input(s): "APPERANCEUR"    Imaging: US Venous Img Lower Bilateral Result Date: 08/25/2023 CLINICAL DATA:  Bilateral lower extremity edema EXAM: BILATERAL LOWER EXTREMITY VENOUS DOPPLER ULTRASOUND TECHNIQUE: Gray-scale sonography with compression, as well as color and duplex ultrasound, were performed to evaluate the deep venous system(s) from the level of the common femoral vein through the popliteal and proximal calf veins. COMPARISON:  02/13/2023 FINDINGS: VENOUS Normal compressibility of the common femoral, superficial femoral, and popliteal veins, as well as the visualized calf veins. Visualized portions of profunda femoral vein and great saphenous vein unremarkable. No filling defects to suggest DVT on grayscale or color Doppler imaging. Doppler waveforms show normal direction of venous flow, normal respiratory plasticity and response to augmentation. OTHER None. Limitations: none IMPRESSION: 1. No evidence of deep venous thrombosis within either lower extremity. Electronically Signed   By: Sharlet Salina M.D.   On: 08/25/2023 22:40   DG Chest 2 View Result Date: 08/25/2023 CLINICAL DATA:  Shortness of breath. Edema. Renal failure on dialysis. EXAM: CHEST - 2 VIEW COMPARISON:  05/07/2023 FINDINGS: Stable cardiomegaly. Mild left basilar pleural thickening is stable. No evidence of acute infiltrate or edema. IMPRESSION: Stable cardiomegaly and left pleural thickening. No active lung disease. Electronically Signed   By: Danae Orleans M.D.   On: 08/25/2023 15:08     Medications:     allopurinol  100 mg Oral Daily    amLODipine  10 mg Oral Daily   aspirin EC  81 mg Oral Daily   buPROPion  100 mg Oral Daily   cloNIDine  0.1 mg Oral TID   escitalopram  20 mg Oral Daily   ferrous sulfate  325 mg Oral TID WC   fluticasone  2 spray Each Nare Daily   furosemide  80 mg Intravenous Q12H   gabapentin  100 mg Oral TID   heparin  5,000 Units Subcutaneous Q8H   hydrALAZINE  50 mg Oral TID   insulin aspart  0-5 Units Subcutaneous QHS   insulin aspart  0-9 Units Subcutaneous TID WC   isosorbide mononitrate  60 mg Oral QHS   nicotine  21 mg Transdermal Daily   pantoprazole  40 mg Oral Daily   sodium zirconium cyclosilicate  10 g Oral Daily   acetaminophen, albuterol, dextromethorphan-guaiFENesin, hydrALAZINE, HYDROcodone-acetaminophen, loperamide, melatonin, ondansetron (ZOFRAN) IV, polyethylene glycol powder  Assessment/ Plan:  Ms. DALLYS NOWAKOWSKI is a 63 y.o.  female with past medical history of  schizophrenia, diabetes mellitus type II, hypertension, tobacco use, hyperlipidemia and chronic kidney disease stage 4. She presents to the ED with and has been admitted for lower extremity edema and has been admitted under observation for Hyperkalemia [E87.5] Peripheral edema [R60.0] Stage 5 chronic kidney disease not on chronic dialysis (HCC) [N18.5]   Chronic kidney disease stage IV with baseline creatinine 2.8 and GFR of 18 on 07/04/23.  Chronic kidney disease is secondary to type 2 diabetes, hypertension, and aortic arthrosclerosis.  Creatinine remains stable. Will order 24 hour creatinine clearance.    Lab Results  Component Value Date   CREATININE 3.10 (H) 08/27/2023   CREATININE 2.67 (H) 08/26/2023   CREATININE 2.81 (H) 08/25/2023    Intake/Output Summary (Last 24 hours) at 08/27/2023 1308 Last data filed at 08/26/2023 2030 Gross per 24 hour  Intake 460 ml  Output 200 ml  Net 260 ml    2.  Hyperkalemia, potassium 5.7 on ED arrival.  Patient prescribed Lokelma 10 g daily outpatient.  Received  shifting measures in emergency department.  Continue daily Lokelma.  Potassium currently 5.0.  3.  Fluid volume overload, bilateral lower extremities.  Chest x-ray negative for pulmonary edema.  Home diuretic regimen of 40 mg of furosemide every other day.  Continues to receive IV furosemide 80 mg twice daily.  Edema has greatly improved since admission.  4. Anemia of chronic kidney disease Lab Results  Component Value Date   HGB 7.7 (L) 08/27/2023    Hemoglobin below acceptable goal.  Will continue to monitor for need of ESA.  Patient reports no signs of melena or hematuria.  5. Secondary Hyperparathyroidism: with outpatient labs: PTH 46, phosphorus 4.3, calcium 9.1 on 07/04/23.   Lab Results  Component Value Date   CALCIUM 9.3 08/27/2023   CAION 1.29 07/14/2023   PHOS 4.2 05/13/2023    Calcium acceptable.  Patient receives ergocalciferol outpatient.   LOS: 1 Mylasia Vorhees 1/25/20251:08 PM

## 2023-08-28 NOTE — ED Notes (Signed)
Unable to reach taxi service/pt unable to reach transportation services.

## 2023-08-28 NOTE — ED Notes (Signed)
This RN called [facility] and spoke with [place]. This RN asked for clarification on the reason the patient was not able to return to the facility. Per Thomasenia Sales, patient was gone for more than 72 hours and would have to be readmitted per administration. This RN requested that administration call back to give further clarification.

## 2023-08-28 NOTE — ED Notes (Signed)
RN provided coffee and sandwich tray.

## 2023-08-28 NOTE — ED Notes (Addendum)
Called patient sister 3x no answer.

## 2023-08-28 NOTE — ED Notes (Addendum)
Called [facility]. RN Barbee Cough answered phone. RN advised ED RN that patient can't come back till Monday so the admissions department can re-admit her. Because they need her insurance/payment method to be re-evaluated.

## 2023-08-28 NOTE — ED Notes (Addendum)
Called [facility] [place] 2x. No response.

## 2023-08-28 NOTE — ED Notes (Signed)
Called taxi service no answer.

## 2023-08-28 NOTE — ED Notes (Signed)
Called sister 2x no answer.

## 2023-08-28 NOTE — ED Notes (Signed)
Attempted to call patients sister. No answer "Mailbox full at this time."

## 2023-08-28 NOTE — ED Notes (Addendum)
Called [facility] [employer] x2 unable to reach/leave VM to book transport.

## 2023-08-28 NOTE — ED Notes (Addendum)
Called [employer] service 3x. Mailbox is full

## 2023-08-28 NOTE — ED Notes (Signed)
Pt sister, Bernerd Limbo, called for request for ride. Unable to reach via phone call.

## 2023-08-28 NOTE — ED Notes (Signed)
At approximately 1030 this RN received a call from [employer] at [facility]. Almira Coaster stated that the DON had called her to come in to figure out what was going on with this patients situation. This RN explained to Almira Coaster that this pt had been discharged last night but we were told they would not take her back until tomorrow and I was trying to get more clarification as to why. Almira Coaster stated that they could not take the pt back because their pharmacy was closed on the weekends and they would not be able to get any medications that were prescribed to the patient. Almira Coaster was informed that the patient was not prescribed any new medications so this would not be an issue. Almira Coaster then stated that she would need to check to see if they had all of the medications that the patient was taking. Almira Coaster was again informed that this should not be an issue because there were no new meds and the patient was a resident there prior to coming to the hospital so they would have been giving her all of her regular medications. Almira Coaster stated that they are going to take the patient back because they have not given her a 30 day notice but that they could not take her back until Monday because the admissions department does not work on the weekends. This RN asked for clarification as to why the patient needed to be readmitted when she had been admitted previously was just being discharged back. This RN expressed to Almira Coaster that I did not understand why the patient would not be allowed to come back today when there were no new meds that needed to be filled, patient had not been give a 30 day notice, and she had previously been admitted to the facility and we would just be discharging her back to where she came from. Almira Coaster stated that she would speak with the DON and call me back.  At approximately 1043, Almira Coaster called back stating that she spoke with the DON and that they would take her back today. Almira Coaster requested that the AVS be amended to have today's date as  the discharge date. Almira Coaster was informed we could not do this as the patient was discharged on 1/25 and we have since been trying to get [facility] to agree to take her back and find her transportation.   Pt is alert and ambulatory with her walker. Pt states that she is able to pay for a taxi. If for some reason patient is not able to pay for a taxi, charge RN will provide a voucher.

## 2023-08-28 NOTE — ED Notes (Signed)
RN attempted to call sister "[name]" no response. Voicemail full.

## 2023-08-28 NOTE — ED Notes (Signed)
Attempted to call [facility] [employer]. No response. Mailbox full.

## 2023-08-28 NOTE — ED Notes (Addendum)
[employer] called to transport patient back to [facility]. Patient states she can pay for taxi.

## 2023-08-28 NOTE — ED Notes (Signed)
Called sister and Taxi service. No response.

## 2023-08-28 NOTE — ED Notes (Signed)
Called [facility] No response.

## 2023-09-08 ENCOUNTER — Ambulatory Visit (INDEPENDENT_AMBULATORY_CARE_PROVIDER_SITE_OTHER): Payer: 59

## 2023-09-08 DIAGNOSIS — Z9889 Other specified postprocedural states: Secondary | ICD-10-CM | POA: Diagnosis not present

## 2023-09-08 DIAGNOSIS — I739 Peripheral vascular disease, unspecified: Secondary | ICD-10-CM

## 2023-09-08 DIAGNOSIS — N186 End stage renal disease: Secondary | ICD-10-CM

## 2023-09-09 LAB — VAS US ABI WITH/WO TBI
Left ABI: 0.74
Right ABI: 0.86

## 2023-09-20 ENCOUNTER — Ambulatory Visit (INDEPENDENT_AMBULATORY_CARE_PROVIDER_SITE_OTHER): Payer: 59 | Admitting: Nurse Practitioner

## 2023-09-20 ENCOUNTER — Encounter (INDEPENDENT_AMBULATORY_CARE_PROVIDER_SITE_OTHER): Payer: Self-pay | Admitting: Nurse Practitioner

## 2023-09-20 VITALS — BP 121/55 | HR 70 | Resp 16

## 2023-09-20 DIAGNOSIS — L97909 Non-pressure chronic ulcer of unspecified part of unspecified lower leg with unspecified severity: Secondary | ICD-10-CM

## 2023-09-20 DIAGNOSIS — I10 Essential (primary) hypertension: Secondary | ICD-10-CM

## 2023-09-20 DIAGNOSIS — N184 Chronic kidney disease, stage 4 (severe): Secondary | ICD-10-CM

## 2023-09-20 DIAGNOSIS — I70299 Other atherosclerosis of native arteries of extremities, unspecified extremity: Secondary | ICD-10-CM

## 2023-09-21 ENCOUNTER — Encounter (INDEPENDENT_AMBULATORY_CARE_PROVIDER_SITE_OTHER): Payer: Self-pay | Admitting: Nurse Practitioner

## 2023-09-21 NOTE — Progress Notes (Signed)
Subjective:    Patient ID: Alicia Rogers, female    DOB: 12-23-1960, 63 y.o.   MRN: [id number] Chief Complaint  Patient presents with   Routine Post Op    [facility] 6 weeks AV fistula creation    The patient is a 63 year old female who returns today for multiple issues.  She had a left brachial axillary AV graft placed on 07/14/2023 and right lower extremity angiogram done on 07/08/2023.  Currently the patient remains not on dialysis at this time.  The patient has wounds on bilateral feet but per the patient she is told that these are healing well.  This wound care is being done at her facility.  Following the angiogram she does have some swelling in the right lower extremity that is worse when her legs are more dependent.  She denies any shortness of breath.  Today her brachial axillary AV graft has a flow volume of 1027.  No obstruction noted.  It is readily palpable today.  Today she has ABI 0.86 on the right and 0.74 on the left.  She has biphasic waveforms and normal toe waveforms.  Her TBI's are improved from prior to her angiogram as well.  Prior to angiogram her ABI was 0.56 on the right and 0.2 on the left     Review of Systems  Musculoskeletal:  Positive for gait problem.  Skin:  Positive for wound.  All other systems reviewed and are negative.      Objective:   Physical Exam Vitals reviewed.  HENT:     Head: Normocephalic.  Cardiovascular:     Rate and Rhythm: Normal rate.     Pulses:          Radial pulses are 2+ on the right side and 2+ on the left side.       Dorsalis pedis pulses are detected w/ Doppler on the right side and detected w/ Doppler on the left side.       Posterior tibial pulses are detected w/ Doppler on the right side and detected w/ Doppler on the left side.  Pulmonary:     Effort: Pulmonary effort is normal.  Skin:    General: Skin is warm and dry.  Neurological:     Mental Status: She is alert and oriented to person, place, and time.   Psychiatric:        Mood and Affect: Mood normal.        Behavior: Behavior normal.        Thought Content: Thought content normal.        Judgment: Judgment normal.     BP (!) 121/55   Pulse 70   Resp 16   Past Medical History:  Diagnosis Date   Anemia    Aortic atherosclerosis (HCC)    Bilateral adrenal adenomas    CKD (chronic kidney disease), stage V (HCC)    Depression    Diabetes mellitus, type II (HCC)    Diverticulosis    Hand swelling 09/27/2021   Hypertension    Paranoid schizophrenia (HCC)    Proteinuria    Schizophrenia (HCC)     Social History   Socioeconomic History   Marital status: Single    Spouse name: Not on file   Number of children: Not on file   Years of education: Not on file   Highest education level: Not on file  Occupational History   Not on file  Tobacco Use   Smoking status: Every Day  Current packs/day: 1.00    Average packs/day: 1 pack/day for 45.4 years (45.4 ttl pk-yrs)    Types: Cigarettes    Start date: 04/21/1978   Smokeless tobacco: Never  Vaping Use   Vaping status: Former  Substance and Sexual Activity   Alcohol use: No    Alcohol/week: 0.0 standard drinks of alcohol   Drug use: No   Sexual activity: Not Currently  Other Topics Concern   Not on file  Social History Narrative   Not on file   Social Drivers of Health   Financial Resource Strain: Medium Risk (03/04/2023)   Received [facility]   Overall Financial Resource Strain (CARDIA)    Difficulty of Paying Living Expenses: Somewhat hard  Food Insecurity: No Food Insecurity (08/26/2023)   Hunger Vital Sign    Worried About Running Out of Food in the Last Year: Never true    Ran Out of Food in the Last Year: Never true  Transportation Needs: No Transportation Needs (08/26/2023)   PRAPARE - Administrator, Civil Service (Medical): No    Lack of Transportation (Non-Medical): No  Physical Activity: Not on file  Stress: Not on  file  Social Connections: Unknown (09/28/2022)   Received from [facility]   Social Network    Social Network: Not on file  Intimate Partner Violence: Not At Risk (08/26/2023)   Humiliation, Afraid, Rape, and Kick questionnaire    Fear of Current or Ex-Partner: No    Emotionally Abused: No    Physically Abused: No    Sexually Abused: No    Past Surgical History:  Procedure Laterality Date   AV FISTULA PLACEMENT Left 07/14/2023   Procedure: ARTERIOVENOUS (AV) FISTULA CREATION;  Surgeon: Annice Needy, MD;  Location: [facility] ORS;  Service: Vascular;  Laterality: Left;   COLONOSCOPY WITH PROPOFOL N/A 07/21/2022   Procedure: COLONOSCOPY WITH PROPOFOL;  Surgeon: Toney Reil, MD;  Location: [facility] ENDOSCOPY;  Service: Gastroenterology;  Laterality: N/A;   colonscopy     ESOPHAGOGASTRODUODENOSCOPY (EGD) WITH PROPOFOL N/A 07/21/2022   Procedure: ESOPHAGOGASTRODUODENOSCOPY (EGD) WITH PROPOFOL;  Surgeon: Toney Reil, MD;  Location: [facility] ENDOSCOPY;  Service: Gastroenterology;  Laterality: N/A;   LOWER EXTREMITY ANGIOGRAPHY Right 07/08/2023   Procedure: Lower Extremity Angiography;  Surgeon: Annice Needy, MD;  Location: [facility] INVASIVE CV LAB;  Service: Cardiovascular;  Laterality: Right;    Family History  Problem Relation Age of Onset   Diabetes Mother    Glaucoma Mother    Heart disease Mother    Heart attack Mother    Depression Mother    Hypertension Mother    Diabetes Father    Glaucoma Father    Hypertension Father    Prostate cancer Father    Diabetes Sister    Depression Sister    Post-traumatic stress disorder Sister     Allergies  Allergen Reactions   Lisinopril Other (See Comments)    Cough  Other Reaction(s): Other (See Comments), Tongue swollen   Penicillins     Rash       Latest Ref Rng & Units 08/27/2023    8:27 PM 08/27/2023    4:22 AM 08/26/2023    6:41 AM  CBC  WBC 4.0 - 10.5 K/uL 8.6   8.9   Hemoglobin 12.0 - 15.0 g/dL 8.1  7.7   7.3   Hematocrit 36.0 - 46.0 % 26.6  24.2  23.9   Platelets 150 - 400 K/uL 403  391       CMP     Component Value Date/Time   NA 139 08/27/2023 2027   NA 140 09/03/2022 1025   NA 142 04/23/2014 0128   K 4.5 08/27/2023 2027   K 3.4 (L) 04/23/2014 0128   CL 106 08/27/2023 2027   CL 105 04/23/2014 0128   CO2 19 (L) 08/27/2023 2027   CO2 30 04/23/2014 0128   GLUCOSE 151 (H) 08/27/2023 2027   GLUCOSE 202 (H) 04/23/2014 0128   BUN 63 (H) 08/27/2023 2027   BUN 69 (H) 09/03/2022 1025   BUN 5 (L) 04/23/2014 0128   CREATININE 3.41 (H) 08/27/2023 2027   CREATININE 3.37 (H) 03/07/2023 1440   CREATININE 0.89 04/23/2014 0128   CALCIUM 9.1 08/27/2023 2027   CALCIUM 9.4 04/23/2014 0128   PROT 7.0 08/25/2023 1409   PROT 6.7 09/03/2022 1025   ALBUMIN 2.8 (L) 08/25/2023 1409   ALBUMIN 3.6 (L) 09/03/2022 1025   AST 19 08/25/2023 1409   AST 22 03/07/2023 1440   ALT 22 08/25/2023 1409   ALT 31 03/07/2023 1440   ALKPHOS 94 08/25/2023 1409   BILITOT 0.3 08/25/2023 1409   BILITOT 0.3 03/07/2023 1440   EGFR 13 (L) 09/03/2022 1025   GFRNONAA 15 (L) 08/27/2023 2027   GFRNONAA 15 (L) 03/07/2023 1440   GFRNONAA >60 04/23/2014 0128     No results found.     Assessment & Plan:   1. Atherosclerosis of artery of extremity with ulceration (HCC) Currently, per the patient the wounds are healing.  The patient does have a history of being a somewhat poor historian and so notes were sent to her facility that if her wound is worsening she should follow-up with [place] sooner.  Based on her study she has marginal ability for wound healing, however I am choosing to be more conservative in this approach due to the fact that the patient is nearing dialysis.  Recurrent angiogram may provide catalyst for pushing her on to dialysis.  Based on this, we will only proceed with intervention if she does have worsening of the wounds or other development of rest pain like symptoms.  Will have the patient return with  noninvasive studies in 3 months.  2. Chronic kidney disease, stage 4 (severe) (HCC) At this time the patient has adequate access for dialysis.  No further intervention necessary until it is necessary to utilize.  At that time she is having any difficulties with dialysis we can reevaluate if intervention may be necessary. 3. Essential hypertension Continue antihypertensive medications as already ordered, these medications have been reviewed and there are no changes at this time.   Current Outpatient Medications on File Prior to Visit  Medication Sig Dispense Refill   allopurinol (ZYLOPRIM) 100 MG tablet Take 100 mg by mouth daily.     amLODipine (NORVASC) 10 MG tablet Take 1 tablet (10 mg total) by mouth daily.     ascorbic acid (VITAMIN C) 500 MG tablet Take 500 mg by mouth daily.     aspirin EC 81 MG tablet Take 81 mg by mouth daily.     [employer] (POLYOX WSR-301) POWD SMARTSIG:17 Gram(s) By Mouth Daily PRN     buPROPion (WELLBUTRIN) 100 MG tablet Take 100 mg by mouth daily.     carbamide peroxide (DEBROX) 6.5 % OTIC solution 5 drops 2 (two) times daily.     cetirizine (ZYRTEC) 5 MG tablet Take 5 mg by mouth daily.     cloNIDine (CATAPRES)  0.1 MG tablet Take 1 tablet (0.1 mg total) by mouth 3 (three) times daily.     cyclobenzaprine (FLEXERIL) 10 MG tablet Take 1 tablet (10 mg total) by mouth at bedtime. (Patient taking differently: Take 10 mg by mouth at bedtime as needed.) 30 tablet 0   dapagliflozin propanediol (FARXIGA) 5 MG TABS tablet Take 5 mg by mouth daily.     ergocalciferol (VITAMIN D2) 1.25 MG (50000 UT) capsule Take 50,000 Units by mouth once a week. On Fridays     escitalopram (LEXAPRO) 20 MG tablet Take 20 mg by mouth daily.     feeding supplement (ENSURE ENLIVE / ENSURE PLUS) LIQD Take 237 mLs by mouth 2 (two) times daily between meals.     ferrous sulfate 325 (65 FE) MG tablet Take 325 mg by mouth every other day. Take with a meal.     furosemide (LASIX) 20 MG tablet  Take 1 tablet (20 mg total) by mouth every other day. (Patient taking differently: Take 40 mg by mouth every other day.)     gabapentin (NEURONTIN) 100 MG capsule Take 100 mg by mouth 3 (three) times daily.     hydrALAZINE (APRESOLINE) 50 MG tablet Take 1 tablet (50 mg total) by mouth 3 (three) times daily.     HYDROcodone-acetaminophen (NORCO/VICODIN) 5-325 MG tablet Take 2 tablets by mouth every 6 (six) hours as needed for moderate pain (pain score 4-6). 20 tablet 0   isosorbide mononitrate (IMDUR) 60 MG 24 hr tablet Take 1 tablet (60 mg total) by mouth at bedtime. Hold if SBP <130     linagliptin (TRADJENTA) 5 MG TABS tablet Take 5 mg by mouth daily.     lip balm (BLISTEX) OINT Apply 1 Application topically as needed for lip care.     losartan (COZAAR) 100 MG tablet Take 100 mg by mouth daily.     melatonin 3 MG TABS tablet Take 2 tablets (6 mg total) by mouth at bedtime as needed.     Multiple Vitamin (MULTIVITAMIN) capsule Take 1 capsule by mouth daily.     naloxone (NARCAN) nasal spray 4 mg/0.1 mL Place 1 spray into the nose 3 (three) times daily as needed.     omeprazole (PRILOSEC) 20 MG capsule Take 20 mg by mouth daily.     Paliperidone ER (INVEGA SUSTENNA) injection Inject 78 mg into the muscle every 30 (thirty) days.     polyethylene glycol powder (GLYCOLAX/MIRALAX) 17 GM/SCOOP powder Take 17 g by mouth daily as needed.     sodium zirconium cyclosilicate (LOKELMA) 10 g PACK packet Take 10 g by mouth daily.     TYLENOL 325 MG tablet Take 650 mg by mouth every 8 (eight) hours as needed for mild pain. *DO NOT EXCEED 4GM OF TYLENOL IN 24 HOURS*     collagenase (SANTYL) 250 UNIT/GM ointment Apply topically daily. (Patient not taking: Reported on 08/26/2023)     fluticasone (FLONASE) 50 MCG/ACT nasal spray Place 2 sprays into both nostrils daily. 48 mL 3   loperamide (IMODIUM) 2 MG capsule Take 2 mg by mouth 2 (two) times daily as needed for diarrhea or loose stools. (Patient not taking:  Reported on 08/26/2023)     nystatin (MYCOSTATIN) 100000 UNIT/ML suspension Take by mouth. (Patient not taking: Reported on 08/26/2023)     omeprazole (PRILOSEC) 40 MG capsule Take 40 mg by mouth daily. (Patient not taking: Reported on 08/26/2023)     Zinc Sulfate 220 (50 Zn) MG TABS Take by mouth. (  Patient not taking: Reported on 08/26/2023)     No current facility-administered medications on file prior to visit.    There are no Patient Instructions on file for this visit. No follow-ups on file.   Georgiana Spinner, NP

## 2023-10-03 ENCOUNTER — Other Ambulatory Visit
Admission: RE | Admit: 2023-10-03 | Discharge: 2023-10-03 | Disposition: A | Discharge status: Home / Self Care | Attending: Nephrology | Admitting: Nephrology

## 2023-10-03 DIAGNOSIS — N186 End stage renal disease: Secondary | ICD-10-CM | POA: Diagnosis present

## 2023-10-03 DIAGNOSIS — Z111 Encounter for screening for respiratory tuberculosis: Secondary | ICD-10-CM | POA: Insufficient documentation

## 2023-10-03 LAB — HEPATITIS B SURFACE ANTIGEN: Hepatitis B Surface Ag: NONREACTIVE

## 2023-10-03 LAB — HEPATITIS C ANTIBODY: HCV Ab: NONREACTIVE

## 2023-10-04 LAB — HEPATITIS B SURFACE ANTIBODY, QUANTITATIVE: Hep B S AB Quant (Post): <3.5 m[IU]/mL — ABNORMAL LOW

## 2023-10-04 LAB — HEPATITIS B CORE ANTIBODY, TOTAL: HEP B CORE AB: NEGATIVE

## 2023-10-07 LAB — QUANTIFERON-TB GOLD PLUS: QuantiFERON-TB Gold Plus: NEGATIVE

## 2023-10-07 LAB — QUANTIFERON-TB GOLD PLUS (RQFGPL)
QuantiFERON Mitogen Value: 1.03 [IU]/mL
QuantiFERON Nil Value: 0.04 [IU]/mL
QuantiFERON TB1 Ag Value: 0.02 [IU]/mL
QuantiFERON TB2 Ag Value: 0.02 [IU]/mL

## 2023-10-25 ENCOUNTER — Telehealth (INDEPENDENT_AMBULATORY_CARE_PROVIDER_SITE_OTHER): Payer: Self-pay

## 2023-10-25 NOTE — Telephone Encounter (Signed)
 A fax came through from [facility] to have the patient scheduled for a fistulagram or permcath placement. Spoke with Trish at [facility] regarding the patient being scheduled for a left arm fistulagram/ permcath placement on 10/26/23 at the [facility], arrival time 10:30 am. Pre-procedure instructions were discussed and Rosann Auerbach stated she understood.

## 2023-10-26 ENCOUNTER — Encounter
Admission: RE | Disposition: A | Discharge status: Home / Self Care | Payer: Self-pay | Source: Ambulatory Visit | Attending: Vascular Surgery

## 2023-10-26 ENCOUNTER — Encounter: Payer: Self-pay | Admitting: Vascular Surgery

## 2023-10-26 ENCOUNTER — Other Ambulatory Visit: Payer: Self-pay

## 2023-10-26 ENCOUNTER — Ambulatory Visit
Admission: RE | Admit: 2023-10-26 | Discharge: 2023-10-26 | Disposition: A | Discharge status: Home / Self Care | Source: Ambulatory Visit | Attending: Vascular Surgery | Admitting: Vascular Surgery

## 2023-10-26 DIAGNOSIS — I953 Hypotension of hemodialysis: Secondary | ICD-10-CM | POA: Diagnosis not present

## 2023-10-26 DIAGNOSIS — T82898A Other specified complication of vascular prosthetic devices, implants and grafts, initial encounter: Secondary | ICD-10-CM

## 2023-10-26 DIAGNOSIS — N186 End stage renal disease: Secondary | ICD-10-CM | POA: Insufficient documentation

## 2023-10-26 DIAGNOSIS — E11649 Type 2 diabetes mellitus with hypoglycemia without coma: Secondary | ICD-10-CM | POA: Insufficient documentation

## 2023-10-26 DIAGNOSIS — Y832 Surgical operation with anastomosis, bypass or graft as the cause of abnormal reaction of the patient, or of later complication, without mention of misadventure at the time of the procedure: Secondary | ICD-10-CM | POA: Diagnosis not present

## 2023-10-26 DIAGNOSIS — Z992 Dependence on renal dialysis: Secondary | ICD-10-CM | POA: Insufficient documentation

## 2023-10-26 DIAGNOSIS — E1122 Type 2 diabetes mellitus with diabetic chronic kidney disease: Secondary | ICD-10-CM | POA: Insufficient documentation

## 2023-10-26 DIAGNOSIS — F17210 Nicotine dependence, cigarettes, uncomplicated: Secondary | ICD-10-CM | POA: Insufficient documentation

## 2023-10-26 DIAGNOSIS — T82590A Other mechanical complication of surgically created arteriovenous fistula, initial encounter: Secondary | ICD-10-CM | POA: Insufficient documentation

## 2023-10-26 DIAGNOSIS — I120 Hypertensive chronic kidney disease with stage 5 chronic kidney disease or end stage renal disease: Secondary | ICD-10-CM | POA: Insufficient documentation

## 2023-10-26 HISTORY — PX: A/V FISTULAGRAM: CATH118298

## 2023-10-26 LAB — POTASSIUM (ARMC VASCULAR LAB ONLY): Potassium (ARMC vascular lab): 4.2 mmol/L (ref 3.5–5.1)

## 2023-10-26 LAB — GLUCOSE, CAPILLARY: Glucose-Capillary: 107 mg/dL — ABNORMAL HIGH (ref 70–99)

## 2023-10-26 SURGERY — A/V FISTULAGRAM
Anesthesia: Moderate Sedation | Laterality: Left

## 2023-10-26 MED ORDER — CEFAZOLIN SODIUM-DEXTROSE 1-4 GM/50ML-% IV SOLN: 1.0000 g | INTRAVENOUS | Status: DC

## 2023-10-26 MED ORDER — VANCOMYCIN HCL IN DEXTROSE 1-5 GM/200ML-% IV SOLN: 1000.0000 mg | Freq: Once | INTRAVENOUS | Status: DC

## 2023-10-26 MED ORDER — LIDOCAINE-EPINEPHRINE (PF) 1 %-1:200000 IJ SOLN
INTRAMUSCULAR | Status: DC | PRN
  Administered 2023-10-26: 10 mL

## 2023-10-26 MED ORDER — MIDAZOLAM HCL 5 MG/5ML IJ SOLN
INTRAMUSCULAR | Status: AC
  Filled 2023-10-26: qty 5

## 2023-10-26 MED ORDER — SODIUM CHLORIDE 0.9 % IV SOLN: INTRAVENOUS | Status: DC

## 2023-10-26 MED ORDER — HEPARIN SODIUM (PORCINE) 1000 UNIT/ML IJ SOLN
INTRAMUSCULAR | Status: AC
  Filled 2023-10-26: qty 10

## 2023-10-26 MED ORDER — MIDAZOLAM HCL 2 MG/2ML IJ SOLN
INTRAMUSCULAR | Status: DC | PRN
  Administered 2023-10-26: 1 mg via INTRAVENOUS

## 2023-10-26 MED ORDER — DIPHENHYDRAMINE HCL 50 MG/ML IJ SOLN: 50.0000 mg | Freq: Once | INTRAMUSCULAR | Status: DC | PRN

## 2023-10-26 MED ORDER — METHYLPREDNISOLONE SODIUM SUCC 125 MG IJ SOLR: 125.0000 mg | Freq: Once | INTRAMUSCULAR | Status: DC | PRN

## 2023-10-26 MED ORDER — FENTANYL CITRATE (PF) 100 MCG/2ML IJ SOLN
INTRAMUSCULAR | Status: DC | PRN
  Administered 2023-10-26: 50 ug via INTRAVENOUS

## 2023-10-26 MED ORDER — CEFAZOLIN SODIUM-DEXTROSE 1-4 GM/50ML-% IV SOLN
INTRAVENOUS | Status: AC
  Filled 2023-10-26: qty 50

## 2023-10-26 MED ORDER — HYDROMORPHONE HCL 1 MG/ML IJ SOLN: 1.0000 mg | Freq: Once | INTRAMUSCULAR | Status: DC | PRN

## 2023-10-26 MED ORDER — FENTANYL CITRATE (PF) 100 MCG/2ML IJ SOLN
INTRAMUSCULAR | Status: AC
  Filled 2023-10-26: qty 2

## 2023-10-26 MED ORDER — HEPARIN (PORCINE) IN NACL 2000-0.9 UNIT/L-% IV SOLN
INTRAVENOUS | Status: DC | PRN
  Administered 2023-10-26: 500 mL

## 2023-10-26 MED ORDER — IODIXANOL 320 MG/ML IV SOLN
INTRAVENOUS | Status: DC | PRN
Start: 2023-10-26 — End: 2023-10-26
  Administered 2023-10-26: 20 mL

## 2023-10-26 MED ORDER — VANCOMYCIN HCL IN DEXTROSE 1-5 GM/200ML-% IV SOLN
INTRAVENOUS | Status: AC
Start: 2023-10-26 — End: ?
  Filled 2023-10-26: qty 200

## 2023-10-26 MED ORDER — FAMOTIDINE 20 MG PO TABS: 40.0000 mg | ORAL_TABLET | Freq: Once | ORAL | Status: DC | PRN

## 2023-10-26 MED ORDER — MIDAZOLAM HCL 2 MG/ML PO SYRP
8.0000 mg | ORAL_SOLUTION | Freq: Once | ORAL | Status: DC | PRN
Start: 2023-10-26 — End: 2023-10-26

## 2023-10-26 MED ORDER — ONDANSETRON HCL 4 MG/2ML IJ SOLN: 4.0000 mg | Freq: Four times a day (QID) | INTRAMUSCULAR | Status: DC | PRN

## 2023-10-26 MED ORDER — HEPARIN SODIUM (PORCINE) 1000 UNIT/ML IJ SOLN
INTRAMUSCULAR | Status: DC | PRN
  Administered 2023-10-26: 3000 [IU] via INTRAVENOUS

## 2023-10-26 SURGICAL SUPPLY — 7 items
COVER PROBE ULTRASOUND 5X96 (MISCELLANEOUS) IMPLANT
DRAPE BRACHIAL (DRAPES) IMPLANT
KIT MICROPUNCTURE VSI 5F STIFF (SHEATH) IMPLANT
PACK ANGIOGRAPHY (CUSTOM PROCEDURE TRAY) ×1 IMPLANT
SHEATH BRITE TIP 6FRX5.5 (SHEATH) IMPLANT
SHEATH BRITE TIP 7FRX5.5 (SHEATH) IMPLANT
SUT MNCRL AB 4-0 PS2 18 (SUTURE) IMPLANT

## 2023-10-26 NOTE — H&P (Signed)
 [facility] Admission History & Physical  MRN : [id number]  Alicia Rogers is a 63 y.o. (Jun 14, 1961) female who presents with chief complaint of No chief complaint on file. Marland Kitchen  History of Present Illness: I am asked to evaluate the patient by the dialysis center. The patient was sent here because they were unable to cannulate the access yesterday morning. Furthermore the Center states there is no thrill or bruit. The patient states this is the first dialysis run to be missed. This problem is acute in onset and has been present for approximately 2 days. The patient is unaware of any other change.   Patient denies pain or tenderness overlying the access.  There is no pain with dialysis.  The patient denies hand pain or finger pain consistent with steal syndrome.    There have not any past interventions or declots of this access.  The patient is chronically hypotensive on dialysis.  Current Facility-Administered Medications  Medication Dose Route Frequency Provider Last Rate Last Admin   0.9 %  sodium chloride infusion   Intravenous Continuous Georgiana Spinner, NP 10 mL/hr at 10/26/23 1056 New Bag at 10/26/23 1056   ceFAZolin (ANCEF) IVPB 1 g/50 mL premix  1 g Intravenous 30 min Pre-Op Georgiana Spinner, NP       diphenhydrAMINE (BENADRYL) injection 50 mg  50 mg Intravenous Once PRN Georgiana Spinner, NP       famotidine (PEPCID) tablet 40 mg  40 mg Oral Once PRN Georgiana Spinner, NP       HYDROmorphone (DILAUDID) injection 1 mg  1 mg Intravenous Once PRN Georgiana Spinner, NP       methylPREDNISolone sodium succinate (SOLU-MEDROL) 125 mg/2 mL injection 125 mg  125 mg Intravenous Once PRN Georgiana Spinner, NP       midazolam (VERSED) 2 MG/ML syrup 8 mg  8 mg Oral Once PRN Georgiana Spinner, NP       ondansetron [facility]) injection 4 mg  4 mg Intravenous Q6H PRN Georgiana Spinner, NP        Past Medical History:  Diagnosis Date   Anemia    Aortic atherosclerosis (HCC)     Bilateral adrenal adenomas    CKD (chronic kidney disease), stage V (HCC)    Depression    Diabetes mellitus, type II (HCC)    Diverticulosis    Hand swelling 09/27/2021   Hypertension    Paranoid schizophrenia (HCC)    Proteinuria    Schizophrenia (HCC)     Past Surgical History:  Procedure Laterality Date   AV FISTULA PLACEMENT Left 07/14/2023   Procedure: ARTERIOVENOUS (AV) FISTULA CREATION;  Surgeon: Annice Needy, MD;  Location: [facility] ORS;  Service: Vascular;  Laterality: Left;   COLONOSCOPY WITH PROPOFOL N/A 07/21/2022   Procedure: COLONOSCOPY WITH PROPOFOL;  Surgeon: Toney Reil, MD;  Location: [facility] ENDOSCOPY;  Service: Gastroenterology;  Laterality: N/A;   colonscopy     ESOPHAGOGASTRODUODENOSCOPY (EGD) WITH PROPOFOL N/A 07/21/2022   Procedure: ESOPHAGOGASTRODUODENOSCOPY (EGD) WITH PROPOFOL;  Surgeon: Toney Reil, MD;  Location: [facility] ENDOSCOPY;  Service: Gastroenterology;  Laterality: N/A;   LOWER EXTREMITY ANGIOGRAPHY Right 07/08/2023   Procedure: Lower Extremity Angiography;  Surgeon: Annice Needy, MD;  Location: [facility] INVASIVE CV LAB;  Service: Cardiovascular;  Laterality: Right;    Social History   Tobacco Use   Smoking status: Every Day    Current packs/day: 1.00    Average packs/day: 1 pack/day for  45.5 years (45.5 ttl pk-yrs)    Types: Cigarettes    Start date: 04/21/1978   Smokeless tobacco: Never  Vaping Use   Vaping status: Former  Substance Use Topics   Alcohol use: No    Alcohol/week: 0.0 standard drinks of alcohol   Drug use: No     Family History  Problem Relation Age of Onset   Diabetes Mother    Glaucoma Mother    Heart disease Mother    Heart attack Mother    Depression Mother    Hypertension Mother    Diabetes Father    Glaucoma Father    Hypertension Father    Prostate cancer Father    Diabetes Sister    Depression Sister    Post-traumatic stress disorder Sister     No family history of bleeding or clotting disorders,  autoimmune disease or porphyria  Allergies  Allergen Reactions   Lisinopril Other (See Comments)    Cough  Other Reaction(s): Other (See Comments), Tongue swollen   Penicillins     Rash     REVIEW OF SYSTEMS (Negative unless checked)  Constitutional: [] Weight loss  [] Fever  [] Chills Cardiac: [] Chest pain   [] Chest pressure   [] Palpitations   [] Shortness of breath when laying flat   [] Shortness of breath at rest   [x] Shortness of breath with exertion. Vascular:  [] Pain in legs with walking   [] Pain in legs at rest   [] Pain in legs when laying flat   [] Claudication   [] Pain in feet when walking  [] Pain in feet at rest  [] Pain in feet when laying flat   [] History of DVT   [] Phlebitis   [] Swelling in legs   [] Varicose veins   [] Non-healing ulcers Pulmonary:   [] Uses home oxygen   [] Productive cough   [] Hemoptysis   [] Wheeze  [] COPD   [] Asthma Neurologic:  [] Dizziness  [] Blackouts   [] Seizures   [x] History of stroke   [] History of TIA  [] Aphasia   [] Temporary blindness   [] Dysphagia   [] Weakness or numbness in arms   [] Weakness or numbness in legs Musculoskeletal:  [] Arthritis   [] Joint swelling   [] Joint pain   [] Low back pain Hematologic:  [] Easy bruising  [] Easy bleeding   [] Hypercoagulable state   [x] Anemic  [] Hepatitis Gastrointestinal:  [] Blood in stool   [] Vomiting blood  [] Gastroesophageal reflux/heartburn   [] Difficulty swallowing. Genitourinary:  [x] Chronic kidney disease   [] Difficult urination  [] Frequent urination  [] Burning with urination   [] Blood in urine Skin:  [] Rashes   [] Ulcers   [] Wounds Psychological:  [] History of anxiety   []  History of major depression.  Physical Examination  Vitals:   10/26/23 1054  BP: (!) 150/48  Resp: 13  Temp: 97.8 F (36.6 C)  TempSrc: Oral  SpO2: 96%  Weight: 57.8 kg  Height: 5\' 2"  (1.575 m)   Body mass index is 23.32 kg/m. Gen: WD/WN, NAD Head: [place]/AT, No temporalis wasting.  Ear/Nose/Throat: Hearing grossly intact, nares w/o  erythema or drainage, oropharynx w/o Erythema/Exudate,  Eyes: Conjunctiva clear, sclera non-icteric Neck: Trachea midline.  No JVD.  Pulmonary:  Good air movement, respirations not labored, no use of accessory muscles.  Cardiac: RRR, normal S1, S2. Vascular: no thrill palpable in left arm AVF Vessel Right Left  Radial Palpable Palpable   Musculoskeletal: M/S 5/5 throughout.  Extremities without ischemic changes.  No deformity or atrophy.  Neurologic: Sensation grossly intact in extremities.  Symmetrical.  Speech is fluent. Motor exam as listed above. Psychiatric: Judgment  and insight are fair Dermatologic: No rashes or ulcers noted.  No cellulitis or open wounds. Lymph : No Cervical, Axillary, or Inguinal lymphadenopathy.   CBC Lab Results  Component Value Date   WBC 8.6 08/27/2023   HGB 8.1 (L) 08/27/2023   HCT 26.6 (L) 08/27/2023   MCV 96.7 08/27/2023   PLT 403 (H) 08/27/2023    BMET    Component Value Date/Time   NA 139 08/27/2023 2027   NA 140 09/03/2022 1025   NA 142 04/23/2014 0128   K 4.5 08/27/2023 2027   K 3.4 (L) 04/23/2014 0128   CL 106 08/27/2023 2027   CL 105 04/23/2014 0128   CO2 19 (L) 08/27/2023 2027   CO2 30 04/23/2014 0128   GLUCOSE 151 (H) 08/27/2023 2027   GLUCOSE 202 (H) 04/23/2014 0128   BUN 63 (H) 08/27/2023 2027   BUN 69 (H) 09/03/2022 1025   BUN 5 (L) 04/23/2014 0128   CREATININE 3.41 (H) 08/27/2023 2027   CREATININE 3.37 (H) 03/07/2023 1440   CREATININE 0.89 04/23/2014 0128   CALCIUM 9.1 08/27/2023 2027   CALCIUM 9.4 04/23/2014 0128   GFRNONAA 15 (L) 08/27/2023 2027   GFRNONAA 15 (L) 03/07/2023 1440   GFRNONAA >60 04/23/2014 0128   GFRAA >60 04/13/2020 1711   GFRAA >60 04/23/2014 0128   CrCl cannot be calculated (Patient's most recent lab result is older than the maximum 21 days allowed.).  COAG Lab Results  Component Value Date   INR 1.1 02/05/2022   INR 0.9 05/10/2021    Radiology No results found.  Assessment/Plan 1.   Complication dialysis device with thrombosis AV access:  Patient's dialysis access is thrombosed. The patient will undergo thrombectomy using interventional techniques.  The risks and benefits were described to the patient.  All questions were answered.  The patient agrees to proceed with angiography and intervention. Potassium will be drawn to ensure that it is an appropriate level prior to performing thrombectomy. 2.  End-stage renal disease requiring hemodialysis:  Patient will continue dialysis therapy without further interruption if a successful thrombectomy is not achieved then catheter will be placed. Dialysis has already been arranged since the patient missed their previous session 3.  Hypertension:  Patient will continue medical management; nephrology is following no changes in oral medications. 4. Diabetes mellitus:  Glucose will be monitored and oral medications been held this morning once the patient has undergone the patient's procedure po intake will be reinitiated and again Accu-Cheks will be used to assess the blood glucose level and treat as needed. The patient will be restarted on the patient's usual hypoglycemic regime     Festus Barren, MD  10/26/2023 11:22 AM

## 2023-10-26 NOTE — Op Note (Signed)
 [facility]    OPERATIVE NOTE   PROCEDURE: 1.   Left brachiocephalic arteriovenous fistula cannulation under ultrasound guidance 2.   Left arm fistulagram including central venogram   PRE-OPERATIVE DIAGNOSIS: 1. ESRD 2. Poorly functional left brachiocephalic AVF  POST-OPERATIVE DIAGNOSIS: same as above   SURGEON: Festus Barren, MD  ANESTHESIA: local with MCS  ESTIMATED BLOOD LOSS: 3 cc  FINDING(S): Patent left brachiocephalic AVF without significant stenosis throughout.  Central venous circulation was widely patent. There was a medium size competing branch in the mid upper arm cephalic vein  SPECIMEN(S):  None  CONTRAST: 20 cc  FLUORO TIME: 0.3 minutes  MODERATE CONSCIOUS SEDATION TIME: Approximately 10 minutes with 1 mg of Versed and 50 mcg of Fentanyl   INDICATIONS: Alicia Rogers is a 63 y.o. female who presents with malfunctioning left brachiocephalic arteriovenous fistula.  The patient is scheduled for left arm fistulagram.  The patient is aware the risks include but are not limited to: bleeding, infection, thrombosis of the cannulated access, and possible anaphylactic reaction to the contrast.  The patient is aware of the risks of the procedure and elects to proceed forward.  DESCRIPTION: After full informed written consent was obtained, the patient was brought back to the angiography suite and placed supine upon the angiography table.  The patient was connected to monitoring equipment. Moderate conscious sedation was administered with a face to face encounter with the patient throughout the procedure with my supervision of the RN administering medicines and monitoring the patient's vital signs and mental status throughout from the start of the procedure until the patient was taken to the recovery room. The left arm was prepped and draped in the standard fashion for a percutaneous access intervention.  Under ultrasound guidance, the left  brachiocephalic arteriovenous fistula was cannulated with a micropuncture needle under direct ultrasound guidance where it was patent and a permanent image was performed.  The microwire was advanced into the fistula and the needle was exchanged for the a microsheath.  I then upsized to a 6 Fr Sheath and imaging was performed.  Hand injections were completed to image the access including the central venous system. This demonstrated patent left brachiocephalic AVF without significant stenosis throughout.  Central venous circulation was widely patent. There was a medium size competing branch in the mid upper arm cephalic vein.  Based on the images, this patient will need no intervention today.  The cephalic vein was 6 mm or more and should be usable for dialysis. If it can not be used going forward, we can consider embolization of the competing branch in the future.  Based on the completion imaging, no further intervention is necessary.  A 4-0 Monocryl purse-string suture was sewn around the sheath.  The sheath was removed while tying down the suture.  A sterile bandage was applied to the puncture site.  COMPLICATIONS: None  CONDITION: Stable   Festus Barren  10/26/2023 12:06 PM   This note was created with Dragon Medical transcription system. Any errors in dictation are purely unintentional.

## 2023-11-03 ENCOUNTER — Ambulatory Visit: Payer: 59 | Admitting: Podiatry

## 2023-11-04 ENCOUNTER — Encounter: Payer: Self-pay | Admitting: Podiatry

## 2023-11-04 ENCOUNTER — Ambulatory Visit (INDEPENDENT_AMBULATORY_CARE_PROVIDER_SITE_OTHER): Admitting: Podiatry

## 2023-11-04 DIAGNOSIS — B351 Tinea unguium: Secondary | ICD-10-CM

## 2023-11-04 DIAGNOSIS — M79674 Pain in right toe(s): Secondary | ICD-10-CM

## 2023-11-04 DIAGNOSIS — E0822 Diabetes mellitus due to underlying condition with diabetic chronic kidney disease: Secondary | ICD-10-CM

## 2023-11-04 DIAGNOSIS — N186 End stage renal disease: Secondary | ICD-10-CM

## 2023-11-04 DIAGNOSIS — M79675 Pain in left toe(s): Secondary | ICD-10-CM | POA: Diagnosis not present

## 2023-11-04 DIAGNOSIS — B353 Tinea pedis: Secondary | ICD-10-CM | POA: Diagnosis not present

## 2023-11-04 DIAGNOSIS — Z992 Dependence on renal dialysis: Secondary | ICD-10-CM

## 2023-11-04 NOTE — Progress Notes (Signed)
 Subjective:  Patient ID: Alicia Rogers, female    DOB: April 09, 1961,  MRN: [id number]  63 y.o. female presents to clinic with  at risk foot care. Pt has h/o NIDDM with PAD and painful elongated mycotic toenails 1-5 bilaterally which are tender when wearing enclosed shoe gear. Pain is relieved with periodic professional debridement. She is a resident of [facility]. She is still receiving wound care of right heel and states her wound is nearly healed.   New problem(s): None   PCP is Leanord Asal, Nelva Bush, MD.  Allergies  Allergen Reactions   Lisinopril Other (See Comments)    Cough  Other Reaction(s): Other (See Comments), Tongue swollen   Penicillins     Rash    Review of Systems: Negative except as noted in the HPI.   Objective:  Alicia Rogers is a pleasant 63 y.o. female WD, WN in NAD. AAO x 3.  Vascular Examination: CFT <3 seconds b/l LE. Faintly palpable DP pulses b/l LE. Faintly palpable PT pulse(s) b/l LE. Pedal hair absent. No pain with calf compression b/l. Nonpitting edema noted BLE. No ischemia or gangrene noted b/l LE. No cyanosis or clubbing noted b/l LE.  Neurological Examination: Protective sensation intact 5/5 intact bilaterally with 10g monofilament b/l. Vibratory sensation intact b/l.  Dermatological Examination: Posterior heel decubitus ulcerations right foot managed by [facility]. Remaining pedal skin is warm and supple b/l LE. No interdigital macerations noted b/l LE. Toenails 1-5 b/l elongated, discolored, dystrophic, thickened, crumbly with subungual debris and tenderness to dorsal palpation. No hyperkeratotic nor porokeratotic lesions present on today's visit.  Dressing noted on right heel and is clean, dry and intact.  Diffuse scaling noted peripherally and plantarly b/l feet.  No interdigital macerations.  No blisters, no weeping. No signs of secondary bacterial infection noted.  Musculoskeletal Examination: Muscle strength  5/5 to all lower extremity muscle groups bilaterally. No pain, crepitus or joint limitation noted with ROM bilateral LE. Pes planus deformity noted bilateral LE.  Radiographs: None  Last A1c:      Latest Ref Rng & Units 05/08/2023    4:12 AM  Hemoglobin A1C  Hemoglobin-A1c 4.8 - 5.6 % 7.6     Assessment:   1. Pain due to onychomycosis of toenails of both feet   2. Tinea pedis of both feet   3. Diabetes mellitus due to underlying condition with chronic kidney disease on chronic dialysis, without long-term current use of insulin (HCC)    Plan:  -Facility to continue fall precautions and pressure precautions. -Order written for facility to staff to apply Ketoconazole 2% cream to both feet once daily for six weeks. -Continue foot and shoe inspections daily. Monitor blood glucose per PCP/Endocrinologist's recommendations. -Mycotic toenails 1-5 bilaterally were debrided in length and girth with sterile nail nippers without incident. -Patient/POA to call should there be question/concern in the interim.  Return in about 3 months (around 02/03/2024).  Freddie Breech, DPM      [place] LOCATION: [address] [place] [address]                   Office [phone]   [place] LOCATION: [address] [place], [place]  [address] Office [phone]

## 2023-11-04 NOTE — Patient Instructions (Signed)
 To [facility] [place]:  Apply Ketoconazole Cream 2% to both feet (exposed toes/foot of right lower extremity) and between toes once daily for six weeks. Wipe exposed toes with cleansing cloth to gently remove scales on toes.

## 2023-11-29 ENCOUNTER — Other Ambulatory Visit (INDEPENDENT_AMBULATORY_CARE_PROVIDER_SITE_OTHER): Payer: Self-pay | Admitting: Vascular Surgery

## 2023-11-29 DIAGNOSIS — N186 End stage renal disease: Secondary | ICD-10-CM

## 2023-11-29 DIAGNOSIS — I739 Peripheral vascular disease, unspecified: Secondary | ICD-10-CM

## 2023-12-07 ENCOUNTER — Encounter (INDEPENDENT_AMBULATORY_CARE_PROVIDER_SITE_OTHER)

## 2023-12-07 ENCOUNTER — Ambulatory Visit (INDEPENDENT_AMBULATORY_CARE_PROVIDER_SITE_OTHER): Admitting: Nurse Practitioner

## 2023-12-20 ENCOUNTER — Encounter (INDEPENDENT_AMBULATORY_CARE_PROVIDER_SITE_OTHER): Payer: 59

## 2023-12-20 ENCOUNTER — Ambulatory Visit (INDEPENDENT_AMBULATORY_CARE_PROVIDER_SITE_OTHER): Payer: 59 | Admitting: Nurse Practitioner

## 2023-12-20 ENCOUNTER — Encounter (INDEPENDENT_AMBULATORY_CARE_PROVIDER_SITE_OTHER): Payer: Self-pay

## 2023-12-20 ENCOUNTER — Encounter

## 2023-12-23 ENCOUNTER — Ambulatory Visit (INDEPENDENT_AMBULATORY_CARE_PROVIDER_SITE_OTHER)

## 2023-12-23 ENCOUNTER — Encounter (INDEPENDENT_AMBULATORY_CARE_PROVIDER_SITE_OTHER): Payer: Self-pay | Admitting: Vascular Surgery

## 2023-12-23 ENCOUNTER — Ambulatory Visit (INDEPENDENT_AMBULATORY_CARE_PROVIDER_SITE_OTHER): Admitting: Vascular Surgery

## 2023-12-23 VITALS — BP 171/63 | HR 80 | Resp 16 | Ht 63.0 in | Wt 130.4 lb

## 2023-12-23 DIAGNOSIS — Z9889 Other specified postprocedural states: Secondary | ICD-10-CM

## 2023-12-23 DIAGNOSIS — I10 Essential (primary) hypertension: Secondary | ICD-10-CM

## 2023-12-23 DIAGNOSIS — L97909 Non-pressure chronic ulcer of unspecified part of unspecified lower leg with unspecified severity: Secondary | ICD-10-CM

## 2023-12-23 DIAGNOSIS — I70299 Other atherosclerosis of native arteries of extremities, unspecified extremity: Secondary | ICD-10-CM

## 2023-12-23 DIAGNOSIS — E1122 Type 2 diabetes mellitus with diabetic chronic kidney disease: Secondary | ICD-10-CM | POA: Diagnosis not present

## 2023-12-23 DIAGNOSIS — N186 End stage renal disease: Secondary | ICD-10-CM | POA: Diagnosis not present

## 2023-12-23 DIAGNOSIS — I739 Peripheral vascular disease, unspecified: Secondary | ICD-10-CM

## 2023-12-23 DIAGNOSIS — Z992 Dependence on renal dialysis: Secondary | ICD-10-CM

## 2023-12-23 NOTE — Assessment & Plan Note (Signed)
 Her duplex today shows this to be widely patent without significant stenosis.  Continue to use her fistula for dialysis.  Plan duplex in 1 year.

## 2023-12-23 NOTE — Progress Notes (Signed)
 MRN : [id number]  Alicia Rogers is a 63 y.o. (1961/02/25) female who presents with chief complaint of  Chief Complaint  Patient presents with   Follow-up    [facility] 6 week with HDA and 3 month ABI  .  History of Present Illness: Patient returns today in follow up of multiple vascular issues.  Her left brachiocephalic AV fistula is working well for her dialysis.  This was placed about 5 or 6 months ago.  We did a fistulogram prior to its use.  No current complaints or problems with this.  Her duplex today shows this to be widely patent without significant stenosis. She is also followed for peripheral arterial disease.  She has previously undergone intervention for ulcerations which have since healed.  No current lifestyle limiting claudication, ulceration, or infection.  She does have occasional swelling more on the right leg than the left. ABIs that are 1.02 on the right and 0.89 on the left with normal digital pressures and waveforms bilaterally.  Current Outpatient Medications  Medication Sig Dispense Refill   allopurinol  (ZYLOPRIM ) 100 MG tablet Take 100 mg by mouth daily.     Amino Acids (AMINO ACID PROTEIN PO) Take by mouth.     amLODipine  (NORVASC ) 10 MG tablet Take 1 tablet (10 mg total) by mouth daily.     ascorbic acid  (VITAMIN C ) 500 MG tablet Take 500 mg by mouth daily.     aspirin  EC 81 MG tablet Take 81 mg by mouth daily.     budesonide -glycopyrrolate-formoterol  (BREZTRI  AEROSPHERE) 160-9-4.8 MCG/ACT AERO inhaler Inhale 2 puffs into the lungs 2 (two) times daily.     buPROPion  (WELLBUTRIN ) 100 MG tablet Take 100 mg by mouth daily.     cetirizine (ZYRTEC) 5 MG tablet Take 5 mg by mouth daily.     cloNIDine  (CATAPRES ) 0.1 MG tablet Take 1 tablet (0.1 mg total) by mouth 3 (three) times daily.     cyclobenzaprine  (FLEXERIL ) 10 MG tablet Take 1 tablet (10 mg total) by mouth at bedtime. (Patient taking differently: Take 10 mg by mouth at bedtime as needed.) 30 tablet 0    dapagliflozin  propanediol (FARXIGA ) 5 MG TABS tablet Take 5 mg by mouth daily.     ergocalciferol  (VITAMIN D2) 1.25 MG (50000 UT) capsule Take 50,000 Units by mouth once a week. On Fridays     escitalopram  (LEXAPRO ) 20 MG tablet Take 20 mg by mouth daily.     feeding supplement (ENSURE ENLIVE / ENSURE PLUS) LIQD Take 237 mLs by mouth 2 (two) times daily between meals.     ferrous sulfate  325 (65 FE) MG tablet Take 325 mg by mouth every other day. Take with a meal.     fluticasone  (FLONASE ) 50 MCG/ACT nasal spray Place 2 sprays into both nostrils daily. 48 mL 3   furosemide  (LASIX ) 20 MG tablet Take 1 tablet (20 mg total) by mouth every other day. (Patient taking differently: Take 40 mg by mouth every other day.)     gabapentin  (NEURONTIN ) 100 MG capsule Take 100 mg by mouth 3 (three) times daily.     hydrALAZINE  (APRESOLINE ) 50 MG tablet Take 1 tablet (50 mg total) by mouth 3 (three) times daily.     HYDROcodone -acetaminophen  (NORCO/VICODIN) 5-325 MG tablet Take 2 tablets by mouth every 6 (six) hours as needed for moderate pain (pain score 4-6). 20 tablet 0   isosorbide  mononitrate (IMDUR ) 60 MG 24 hr tablet Take 1 tablet (60 mg total) by mouth at  bedtime. Hold if SBP <130     ketoconazole  (NIZORAL ) 2 % cream Apply 1 Application topically daily.     lactulose (CEPHULAC) 10 g packet Take 10 g by mouth 2 (two) times daily.     lidocaine -prilocaine (EMLA) cream Apply 1 Application topically once. On Tues, Thurs, Sat     linagliptin  (TRADJENTA ) 5 MG TABS tablet Take 5 mg by mouth daily.     lip balm (BLISTEX) OINT Apply 1 Application topically as needed for lip care.     losartan  (COZAAR ) 100 MG tablet Take 100 mg by mouth daily.     melatonin 3 MG TABS tablet Take 2 tablets (6 mg total) by mouth at bedtime as needed.     metolazone (ZAROXOLYN) 5 MG tablet Take 5 mg by mouth daily.     Multiple Vitamin (MULTIVITAMIN) capsule Take 1 capsule by mouth daily.     naloxone (NARCAN) nasal spray 4 mg/0.1 mL  Place 1 spray into the nose 3 (three) times daily as needed.     omeprazole  (PRILOSEC) 20 MG capsule Take 20 mg by mouth daily.     Paliperidone  ER (INVEGA  SUSTENNA) injection Inject 78 mg into the muscle every 30 (thirty) days.     polyethylene glycol powder (GLYCOLAX /MIRALAX ) 17 GM/SCOOP powder Take 17 g by mouth daily as needed.     sennosides-docusate sodium  (SENOKOT-S) 8.6-50 MG tablet Take 1 tablet by mouth daily.     sodium zirconium cyclosilicate  (LOKELMA ) 10 g PACK packet Take 10 g by mouth daily.     [employer] (POLYOX WSR-301) POWD SMARTSIG:17 Gram(s) By Mouth Daily PRN (Patient not taking: Reported on 10/26/2023)     carbamide peroxide (DEBROX) 6.5 % OTIC solution 5 drops 2 (two) times daily. (Patient not taking: Reported on 10/26/2023)     collagenase  (SANTYL ) 250 UNIT/GM ointment Apply topically daily. (Patient not taking: Reported on 08/26/2023)     loperamide  (IMODIUM ) 2 MG capsule Take 2 mg by mouth 2 (two) times daily as needed for diarrhea or loose stools. (Patient not taking: Reported on 08/26/2023)     nystatin (MYCOSTATIN) 100000 UNIT/ML suspension Take by mouth. (Patient not taking: Reported on 08/26/2023)     omeprazole  (PRILOSEC) 40 MG capsule Take 40 mg by mouth daily. (Patient not taking: Reported on 08/26/2023)     TYLENOL  325 MG tablet Take 650 mg by mouth every 8 (eight) hours as needed for mild pain. *DO NOT EXCEED 4GM OF TYLENOL  IN 24 HOURS* (Patient not taking: Reported on 10/26/2023)     Zinc  Sulfate 220 (50 Zn) MG TABS Take by mouth. (Patient not taking: Reported on 08/26/2023)     No current facility-administered medications for this visit.    Past Medical History:  Diagnosis Date   Anemia    Aortic atherosclerosis (HCC)    Bilateral adrenal adenomas    CKD (chronic kidney disease), stage V (HCC)    Depression    Diabetes mellitus, type II (HCC)    Diverticulosis    Hand swelling 09/27/2021   Hypertension    Paranoid schizophrenia (HCC)    Proteinuria     Schizophrenia (HCC)     Past Surgical History:  Procedure Laterality Date   A/V FISTULAGRAM Left 10/26/2023   Procedure: A/V Fistulagram;  Surgeon: Celso College, MD;  Location: [facility] INVASIVE CV LAB;  Service: Cardiovascular;  Laterality: Left;   AV FISTULA PLACEMENT Left 07/14/2023   Procedure: ARTERIOVENOUS (AV) FISTULA CREATION;  Surgeon: Celso College, MD;  Location: [facility]  ORS;  Service: Vascular;  Laterality: Left;   COLONOSCOPY WITH PROPOFOL  N/A 07/21/2022   Procedure: COLONOSCOPY WITH PROPOFOL ;  Surgeon: Selena Daily, MD;  Location: [facility] ENDOSCOPY;  Service: Gastroenterology;  Laterality: N/A;   colonscopy     ESOPHAGOGASTRODUODENOSCOPY (EGD) WITH PROPOFOL  N/A 07/21/2022   Procedure: ESOPHAGOGASTRODUODENOSCOPY (EGD) WITH PROPOFOL ;  Surgeon: Selena Daily, MD;  Location: [facility] ENDOSCOPY;  Service: Gastroenterology;  Laterality: N/A;   LOWER EXTREMITY ANGIOGRAPHY Right 07/08/2023   Procedure: Lower Extremity Angiography;  Surgeon: Celso College, MD;  Location: [facility] INVASIVE CV LAB;  Service: Cardiovascular;  Laterality: Right;     Social History   Tobacco Use   Smoking status: Every Day    Current packs/day: 1.00    Average packs/day: 1 pack/day for 45.7 years (45.7 ttl pk-yrs)    Types: Cigarettes    Start date: 04/21/1978   Smokeless tobacco: Never  Vaping Use   Vaping status: Former  Substance Use Topics   Alcohol use: No    Alcohol/week: 0.0 standard drinks of alcohol   Drug use: No       Family History  Problem Relation Age of Onset   Diabetes Mother    Glaucoma Mother    Heart disease Mother    Heart attack Mother    Depression Mother    Hypertension Mother    Diabetes Father    Glaucoma Father    Hypertension Father    Prostate cancer Father    Diabetes Sister    Depression Sister    Post-traumatic stress disorder Sister      Allergies  Allergen Reactions   Lisinopril Other (See Comments)    Cough  Other Reaction(s): Other (See Comments),  Tongue swollen   Penicillins     Rash     REVIEW OF SYSTEMS (Negative unless checked)  Constitutional: [] Weight loss  [] Fever  [] Chills Cardiac: [] Chest pain   [] Chest pressure   [] Palpitations   [] Shortness of breath when laying flat   [] Shortness of breath at rest   [] Shortness of breath with exertion. Vascular:  [] Pain in legs with walking   [] Pain in legs at rest   [] Pain in legs when laying flat   [] Claudication   [] Pain in feet when walking  [] Pain in feet at rest  [] Pain in feet when laying flat   [] History of DVT   [] Phlebitis   [] Swelling in legs   [] Varicose veins   [] Non-healing ulcers Pulmonary:   [] Uses home oxygen   [] Productive cough   [] Hemoptysis   [] Wheeze  [] COPD   [] Asthma Neurologic:  [] Dizziness  [] Blackouts   [] Seizures   [] History of stroke   [] History of TIA  [] Aphasia   [] Temporary blindness   [] Dysphagia   [] Weakness or numbness in arms   [x] Weakness or numbness in legs Musculoskeletal:  [x] Arthritis   [] Joint swelling   [x] Joint pain   [] Low back pain Hematologic:  [] Easy bruising  [] Easy bleeding   [] Hypercoagulable state   [x] Anemic   Gastrointestinal:  [] Blood in stool   [] Vomiting blood  [] Gastroesophageal reflux/heartburn   [] Abdominal pain Genitourinary:  [x] Chronic kidney disease   [] Difficult urination  [] Frequent urination  [] Burning with urination   [] Hematuria Skin:  [] Rashes   [] Ulcers   [] Wounds Psychological:  [x] History of anxiety   [x]  History of major depression.  Physical Examination  BP (!) 171/63   Pulse 80   Resp 16   Ht 5\' 3"  (1.6 m)   Wt 130 lb 6.4 oz (59.1  kg)   BMI 23.10 kg/m  Gen:  WD/WN, NAD. Appears older than stated age. Head: [place]/AT, No temporalis wasting. Ear/Nose/Throat: Hearing grossly intact, nares w/o erythema or drainage Eyes: Conjunctiva clear. Sclera non-icteric Neck: Supple.  Trachea midline Pulmonary:  Good air movement, no use of accessory muscles.  Cardiac: RRR, no JVD Vascular:  Vessel Right Left  Radial  Palpable Palpable                          PT Palpable Palpable  DP Palpable Palpable   Gastrointestinal: soft, non-tender/non-distended. No guarding/reflex.  Musculoskeletal: M/S 5/5 throughout.  No deformity or atrophy. 1+ RLE edema, trace LLE edema. Neurologic: Sensation grossly intact in extremities.  Symmetrical.  Speech is fluent.  Psychiatric: Judgment and insight appear fair at best Dermatologic: No rashes or ulcers noted.  No cellulitis or open wounds.      Labs Recent Results (from the past 2160 hours)  Hepatitis B core antibody, total     Status: None   Collection Time: 10/03/23  9:04 AM  Result Value Ref Range   HEP B CORE AB Negative Negative    Comment: (NOTE) Performed At: [facility] [address] [place], [place] [id number] Pearlean Botts MD [id number]   Hepatitis B surface antibody,quantitative     Status: Abnormal   Collection Time: 10/03/23  9:04 AM  Result Value Ref Range   Hep B S AB Quant (Post) <3.5 (L) Immunity>10 mIU/mL    Comment: (NOTE)  Status of Immunity                     Anti-HBs Level  ------------------                     -------------- Inconsistent with Immunity                  0.0 - 10.0 Consistent with Immunity                         >10.0 Performed At: [facility] [address] [place], [place] [id number] Pearlean Botts MD [id number]   Hepatitis B surface antigen     Status: None   Collection Time: 10/03/23  9:04 AM  Result Value Ref Range   Hepatitis B Surface Ag NON REACTIVE NON REACTIVE    Comment: Performed at [facility], [address] [place], [place] [address]  Hepatitis C antibody     Status: None   Collection Time: 10/03/23  9:04 AM  Result Value Ref Range   HCV Ab NON REACTIVE NON REACTIVE    Comment: (NOTE) Nonreactive HCV antibody screen is consistent with no HCV infections,  unless recent infection is suspected or other evidence exists to indicate HCV  infection.  Performed at [facility], [address] [place], [place] [address]   QuantiFERON-TB Pecola Bouquet Plus     Status: None   Collection Time: 10/03/23  9:04 AM  Result Value Ref Range   QuantiFERON Incubation Incubation performed.    QuantiFERON-TB Gold Plus Negative Negative    Comment: (NOTE) No response to M tuberculosis antigens detected. Infection with M tuberculosis is unlikely, but high risk individuals should be considered for additional testing (ATS/IDSA/CDC Clinical Practice Guidelines, 2017). The reference range is an Antigen minus Nil result of <0.35 IU/mL. Chemiluminescence immunoassay methodology Performed At: [facility] [address] [place], [place] [id number]  Pearlean Botts MD ZO:[id number]   Laurinda Porch Plus     Status: None   Collection Time: 10/03/23  9:04 AM  Result Value Ref Range   QuantiFERON Criteria Comment     Comment: (NOTE) QuantiFERON-TB Gold Plus is a qualitative indirect test for M tuberculosis infection (including disease) and is intended for use in conjunction with risk assessment, radiography, and other medical and diagnostic evaluations. The QuantiFERON-TB Gold Plus result is determined by subtracting the Nil value from either TB antigen (Ag) value. The Mitogen tube serves as a control for the test.    QuantiFERON TB1 Ag Value 0.02 IU/mL   QuantiFERON TB2 Ag Value 0.02 IU/mL   QuantiFERON Nil Value 0.04 IU/mL   QuantiFERON Mitogen Value 1.03 IU/mL    Comment: (NOTE) Performed At: [facility] [address] [place], [place] [id number] Pearlean Botts MD [id number]   Potassium [facility] vascular lab only)     Status: None   Collection Time: 10/26/23 10:44 AM  Result Value Ref Range   Potassium [facility] vascular lab) 4.2 3.5 - 5.1 mmol/L    Comment: Performed at [facility], [address] [place], [place] [address]  Glucose, capillary     Status: Abnormal   Collection Time: 10/26/23 10:45 AM   Result Value Ref Range   Glucose-Capillary 107 (H) 70 - 99 mg/dL    Comment: Glucose reference range applies only to samples taken after fasting for at least 8 hours.    Radiology No results found.  Assessment/Plan  ESRD on dialysis [facility]) Her duplex today shows this to be widely patent without significant stenosis.  Continue to use her fistula for dialysis.  Plan duplex in 1 year.  Type II diabetes mellitus with renal manifestations (HCC) blood glucose control important in reducing the progression of atherosclerotic disease. Also, involved in wound healing. On appropriate medications.   Essential hypertension blood pressure control important in reducing the progression of atherosclerotic disease. On appropriate oral medications.   Atherosclerosis of artery of extremity with ulceration (HCC) ABIs that are 1.02 on the right and 0.89 on the left with normal digital pressures and waveforms bilaterally.  Previous intervention for ulcerations which have since healed.  Continue current medical regimen.  Recheck in 1 year.    Mikki [name], MD  12/23/2023 12:56 PM    This note was created with Dragon medical transcription system.  Any errors from dictation are purely unintentional

## 2023-12-23 NOTE — Assessment & Plan Note (Signed)
 ABIs that are 1.02 on the right and 0.89 on the left with normal digital pressures and waveforms bilaterally.  Previous intervention for ulcerations which have since healed.  Continue current medical regimen.  Recheck in 1 year.

## 2023-12-23 NOTE — Assessment & Plan Note (Signed)
 blood glucose control important in reducing the progression of atherosclerotic disease. Also, involved in wound healing. On appropriate medications.

## 2023-12-23 NOTE — Assessment & Plan Note (Signed)
 blood pressure control important in reducing the progression of atherosclerotic disease. On appropriate oral medications.

## 2023-12-27 LAB — VAS US ABI WITH/WO TBI
Left ABI: 0.89
Right ABI: 1.02

## 2023-12-30 ENCOUNTER — Emergency Department: Admission: EM | Admit: 2023-12-30 | Discharge: 2023-12-30 | Disposition: A | Discharge status: Home / Self Care

## 2023-12-30 ENCOUNTER — Other Ambulatory Visit: Payer: Self-pay

## 2023-12-30 ENCOUNTER — Encounter: Payer: Self-pay | Admitting: Emergency Medicine

## 2023-12-30 DIAGNOSIS — J449 Chronic obstructive pulmonary disease, unspecified: Secondary | ICD-10-CM | POA: Insufficient documentation

## 2023-12-30 DIAGNOSIS — Z992 Dependence on renal dialysis: Secondary | ICD-10-CM | POA: Insufficient documentation

## 2023-12-30 DIAGNOSIS — N186 End stage renal disease: Secondary | ICD-10-CM | POA: Insufficient documentation

## 2023-12-30 DIAGNOSIS — E1122 Type 2 diabetes mellitus with diabetic chronic kidney disease: Secondary | ICD-10-CM | POA: Diagnosis not present

## 2023-12-30 LAB — CBC WITH DIFFERENTIAL/PLATELET
Abs Immature Granulocytes: 0.04 10*3/uL (ref 0.00–0.07)
Basophils Absolute: 0 10*3/uL (ref 0.0–0.1)
Basophils Relative: 1 %
Eosinophils Absolute: 0.2 10*3/uL (ref 0.0–0.5)
Eosinophils Relative: 2 %
HCT: 40.9 % (ref 36.0–46.0)
Hemoglobin: 12.3 g/dL (ref 12.0–15.0)
Immature Granulocytes: 1 %
Lymphocytes Relative: 25 %
Lymphs Abs: 2.1 10*3/uL (ref 0.7–4.0)
MCH: 28.1 pg (ref 26.0–34.0)
MCHC: 30.1 g/dL (ref 30.0–36.0)
MCV: 93.4 fL (ref 80.0–100.0)
Monocytes Absolute: 0.7 10*3/uL (ref 0.1–1.0)
Monocytes Relative: 8 %
Neutro Abs: 5.3 10*3/uL (ref 1.7–7.7)
Neutrophils Relative %: 63 %
Platelets: 327 10*3/uL (ref 150–400)
RBC: 4.38 MIL/uL (ref 3.87–5.11)
RDW: 16 % — ABNORMAL HIGH (ref 11.5–15.5)
WBC: 8.3 10*3/uL (ref 4.0–10.5)
nRBC: 0 % (ref 0.0–0.2)

## 2023-12-30 LAB — BASIC METABOLIC PANEL WITH GFR
Anion gap: 10 (ref 5–15)
BUN: 62 mg/dL — ABNORMAL HIGH (ref 8–23)
CO2: 22 mmol/L (ref 22–32)
Calcium: 9.5 mg/dL (ref 8.9–10.3)
Chloride: 104 mmol/L (ref 98–111)
Creatinine, Ser: 3.69 mg/dL — ABNORMAL HIGH (ref 0.44–1.00)
GFR, Estimated: 13 mL/min — ABNORMAL LOW (ref >60)
Glucose, Bld: 114 mg/dL — ABNORMAL HIGH (ref 70–99)
Potassium: 5.1 mmol/L (ref 3.5–5.1)
Sodium: 136 mmol/L (ref 135–145)

## 2023-12-30 NOTE — ED Notes (Signed)
 Attempted to call sister to come and pick patient up to transfer back to [facility].

## 2023-12-30 NOTE — ED Triage Notes (Signed)
 First Nurse Note: Patient to ED via [facility] from [facility]. Here for dialysis- had not had since 5/20. Facility did not say why but dialysis needs her cleared before they will see her.  152/66 74 HR 98 RA

## 2023-12-30 NOTE — ED Triage Notes (Addendum)
 BIB EMS from [facility].  Pt reports she has not been to dialysis since 5/20 due to "not having any clothes to wear"  Facility stated dialysis needs to have her medically cleared before they will dialyze her as she has missed so many appts.  Pt has no complaints at time of triage.

## 2023-12-30 NOTE — Discharge Instructions (Addendum)
 Your evaluation in the emergency department was reassuring, and there is no indication for emergent dialysis today.  YOU ARE MEDICALLY CLEARED FOR DIALYSIS TOMORROW.  Please follow-up with your primary care provider, and return to the emergency department with any new or worsening symptoms.

## 2023-12-30 NOTE — ED Notes (Signed)
 Patient used the app [employer] to obtain a ride to her sister's house. Patient AOX4 and stated she didn't want to go back to [facility]. This RN attempted to call [facility] and no personal answered phone call.

## 2023-12-30 NOTE — ED Provider Notes (Signed)
 [facility] Provider Note    Event Date/Time   First MD Initiated Contact with Patient 12/30/23 1704     (approximate)   History   Missed Dialysis  First Nurse Note: Patient to ED via [facility] from [facility]. Here for dialysis- had not had since 5/20. Facility did not say why but dialysis needs her cleared before they will see her.  152/66 74 HR 98 RA  BIB EMS from [facility].  Pt reports she has not been to dialysis since 5/20 due to "not having any clothes to wear"  Facility stated dialysis needs to have her medically cleared before they will dialyze her as she has missed so many appts.  Pt has no complaints at time of triage.    HPI Alicia Rogers is a 63 y.o. female PMH schizophrenia, T2DM, hyperlipidemia, ESRD on dialysis (Tues/Sat, LUE AVF), COPD, iron  deficiency anemia presents for evaluation due to missed dialysis -Patient is normally dialyzed only twice weekly, Tuesday, Saturday.  Missed both sessions this past week because she felt she did not have appropriate close to where.  Is planned for dialysis tomorrow but was told to come to the emergency department for medical clearance prior to repeat dialysis session. -Feels well--no shortness of breath.  Responds all questions appropriately with me. -Confirms she will go to dialysis tomorrow if medically cleared and has a ride to get there     Physical Exam   Triage Vital Signs: ED Triage Vitals [12/30/23 1533]  Encounter Vitals Group     BP (!) 158/61     Systolic BP Percentile      Diastolic BP Percentile      Pulse Rate 74     Resp 18     Temp 97.6 F (36.4 C)     Temp Source Oral     SpO2 96 %     Weight      Height      Head Circumference      [facility] Flow      Pain Score 0     Pain Loc      Pain Education      Exclude from Growth Chart     Most recent vital signs: Vitals:   12/30/23 1533  BP: (!) 158/61  Pulse: 74  Resp: 18  Temp: 97.6 F (36.4 C)  SpO2: 96%      General: Awake, no distress.  CV:  Good peripheral perfusion. RRR, RP 2+. LUE AVF w/ +thrill Resp:  Normal effort. CTAB Abd:  No distention. Nontender to deep palpation throughout Neuro:  Alert, oriented, responds all questions appropriately   ED Results / Procedures / Treatments   Labs (all labs ordered are listed, but only abnormal results are displayed) Labs Reviewed  CBC WITH DIFFERENTIAL/PLATELET - Abnormal; Notable for the following components:      Result Value   RDW 16.0 (*)    All other components within normal limits  BASIC METABOLIC PANEL WITH GFR - Abnormal; Notable for the following components:   Glucose, Bld 114 (*)    BUN 62 (*)    Creatinine, Ser 3.69 (*)    GFR, Estimated 13 (*)    All other components within normal limits     EKG  N/a   RADIOLOGY N/a    PROCEDURES:  Critical Care performed: No  Procedures   MEDICATIONS ORDERED IN ED: Medications - No data to display   IMPRESSION / MDM / ASSESSMENT AND PLAN /  ED COURSE  I reviewed the triage vital signs and the nursing notes.                              DDX/MDM/AP: Differential diagnosis includes, but is not limited to, routine medical clearance for dialysis in the setting of 2 missed dialysis sessions.  Patient with stable vital signs here, no respiratory distress abdomen do not suspect severe hypervolemia/pulmonary edema.  Consider possibility of underlying hyperkalemia.  Does not appear to have uremic encephalopathy.  Plan: - Screening labs  Patient's presentation is most consistent with acute presentation with potential threat to life or bodily function.    ED course below.  Laboratory workup reassuring, potassium normal.  No indication for emergent dialysis at this time and patient is stable for routine dialysis which is already scheduled tomorrow.  Discharged home with plan for dialysis.  ED return precautions in place.  Patient agrees with plan.  Clinical Course as of  12/30/23 1821  Fri Dec 30, 2023  1810 BMP overall reassuring--potassium 5.1.  CBC unremarkable [MM]    Clinical Course User Index [MM] Collis Deaner, MD     FINAL CLINICAL IMPRESSION(S) / ED DIAGNOSES   Final diagnoses:  ESRD (end stage renal disease) on dialysis [facility])     Rx / DC Orders   ED Discharge Orders     None        Note:  This document was prepared using Dragon voice recognition software and may include unintentional dictation errors.   Collis Deaner, MD 12/30/23 [phone]

## 2023-12-30 NOTE — ED Notes (Signed)
 Attempted to call [facility]. No answer.

## 2024-01-06 ENCOUNTER — Encounter: Payer: Self-pay | Admitting: Internal Medicine

## 2024-01-06 ENCOUNTER — Encounter

## 2024-01-12 ENCOUNTER — Encounter: Payer: Self-pay | Admitting: Internal Medicine

## 2024-01-12 ENCOUNTER — Ambulatory Visit (LOCAL_COMMUNITY_HEALTH_CENTER)

## 2024-01-12 VITALS — BP 136/62 | Wt 129.0 lb

## 2024-01-12 DIAGNOSIS — Z3009 Encounter for other general counseling and advice on contraception: Secondary | ICD-10-CM

## 2024-01-12 DIAGNOSIS — Z3202 Encounter for pregnancy test, result negative: Secondary | ICD-10-CM

## 2024-01-12 LAB — PREGNANCY, URINE: Preg Test, Ur: NEGATIVE
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
## 2024-01-25 ENCOUNTER — Encounter: Payer: Self-pay | Admitting: Internal Medicine

## 2024-02-10 ENCOUNTER — Ambulatory Visit (INDEPENDENT_AMBULATORY_CARE_PROVIDER_SITE_OTHER): Admitting: Podiatry

## 2024-02-10 ENCOUNTER — Encounter: Payer: Self-pay | Admitting: Podiatry

## 2024-02-10 VITALS — Ht 63.0 in | Wt 129.0 lb

## 2024-02-10 DIAGNOSIS — B353 Tinea pedis: Secondary | ICD-10-CM

## 2024-02-10 DIAGNOSIS — M79675 Pain in left toe(s): Secondary | ICD-10-CM | POA: Diagnosis not present

## 2024-02-10 DIAGNOSIS — B351 Tinea unguium: Secondary | ICD-10-CM

## 2024-02-10 DIAGNOSIS — M79674 Pain in right toe(s): Secondary | ICD-10-CM

## 2024-02-10 DIAGNOSIS — N186 End stage renal disease: Secondary | ICD-10-CM

## 2024-02-10 DIAGNOSIS — Z992 Dependence on renal dialysis: Secondary | ICD-10-CM

## 2024-02-10 DIAGNOSIS — E0822 Diabetes mellitus due to underlying condition with diabetic chronic kidney disease: Secondary | ICD-10-CM

## 2024-02-10 NOTE — Patient Instructions (Addendum)
 To [facility]:  Apply Ketoconazole  cream 2% to both feet and between toes once daily for 6 weeks.

## 2024-02-16 NOTE — Progress Notes (Signed)
  Subjective:  Patient ID: Alicia Rogers, female    DOB: 05-18-1961,  MRN: [id number]  Alicia Rogers presents to clinic today for at risk foot care with h/o NIDDM with ESRD on hemodialysis and painful thick toenails that are difficult to trim. Pain interferes with ambulation. Aggravating factors include wearing enclosed shoe gear. Pain is relieved with periodic professional debridement. Patient is a resident of [facility]. Chief Complaint  Patient presents with   Nail Problem    Pt is here for [facility] unsure of last A1C PCP is Dr Odell and LOV was in February.   New problem(s): None.   PCP is Austin Candyce Stall, MD.  Allergies  Allergen Reactions   Lisinopril Other (See Comments)    Cough  Other Reaction(s): Other (See Comments), Tongue swollen   Penicillins     Rash    Review of Systems: Negative except as noted in the HPI.  Objective: No changes noted in today's physical examination. There were no vitals filed for this visit. Alicia Rogers is a pleasant 63 y.o. female in NAD. AAO x 3.  Vascular Examination: CFT <3 seconds b/l LE. Faintly palpable DP pulses b/l LE. Faintly palpable PT pulse(s) b/l LE. Pedal hair absent. No pain with calf compression b/l. Nonpitting edema noted BLE. No ischemia or gangrene noted b/l LE. No cyanosis or clubbing noted b/l LE.  Neurological Examination: Protective sensation intact 5/5 intact bilaterally with 10g monofilament b/l. Vibratory sensation intact b/l.  Dermatological Examination: No evidence of open wounds or pressure injuries b/l LE. Pedal skin is warm b/l LE. Toenails 1-5 b/l elongated, discolored, dystrophic, thickened, crumbly with subungual debris and tenderness to dorsal palpation. No hyperkeratotic nor porokeratotic lesions present on today's visit.  Diffuse scaling noted dorsal aspect of digits 1-5 b/l.  No interdigital macerations.  No blisters, no weeping. No signs of secondary bacterial infection  noted.  Musculoskeletal Examination: Muscle strength 5/5 to all lower extremity muscle groups bilaterally. No pain, crepitus or joint limitation noted with ROM bilateral LE. Pes planus deformity noted bilateral LE.  Radiographs: None  Assessment/Plan: 1. Pain due to onychomycosis of toenails of both feet   2. Tinea pedis of both feet   3. Diabetes mellitus due to underlying condition with chronic kidney disease on chronic dialysis, without long-term current use of insulin  [facility])     -Patient was evaluated today. All questions/concerns addressed on today's visit. -Facility to continue fall precautions and pressure precautions. -Order written for facility to apply Ketoconazole  Cream 2% to both feet and between toes once daily for 6 weeks. -Continue foot and shoe inspections daily. Monitor blood glucose per PCP/Endocrinologist's recommendations. -Patient/POA to call should there be question/concern in the interim.   Return in about 3 months (around 05/12/2024).  Delon LITTIE Merlin, DPM       LOCATION: [address], [place] [address]                   Office [phone]   [facility] LOCATION: [address] [place], [place] [address] Office [phone]

## 2024-03-07 ENCOUNTER — Encounter

## 2024-03-11 ENCOUNTER — Encounter: Payer: Self-pay | Admitting: Internal Medicine

## 2024-03-11 ENCOUNTER — Emergency Department
Admission: EM | Admit: 2024-03-11 | Discharge: 2024-03-12 | Disposition: A | Discharge status: Home / Self Care | Attending: Emergency Medicine | Admitting: Emergency Medicine

## 2024-03-11 ENCOUNTER — Emergency Department

## 2024-03-11 ENCOUNTER — Other Ambulatory Visit: Payer: Self-pay

## 2024-03-11 DIAGNOSIS — I120 Hypertensive chronic kidney disease with stage 5 chronic kidney disease or end stage renal disease: Secondary | ICD-10-CM | POA: Insufficient documentation

## 2024-03-11 DIAGNOSIS — R1084 Generalized abdominal pain: Secondary | ICD-10-CM | POA: Diagnosis present

## 2024-03-11 DIAGNOSIS — E1122 Type 2 diabetes mellitus with diabetic chronic kidney disease: Secondary | ICD-10-CM | POA: Diagnosis not present

## 2024-03-11 DIAGNOSIS — K760 Fatty (change of) liver, not elsewhere classified: Secondary | ICD-10-CM | POA: Insufficient documentation

## 2024-03-11 DIAGNOSIS — Z992 Dependence on renal dialysis: Secondary | ICD-10-CM | POA: Insufficient documentation

## 2024-03-11 DIAGNOSIS — R7401 Elevation of levels of liver transaminase levels: Secondary | ICD-10-CM | POA: Diagnosis not present

## 2024-03-11 DIAGNOSIS — N186 End stage renal disease: Secondary | ICD-10-CM | POA: Insufficient documentation

## 2024-03-11 LAB — CBC
HCT: 33.4 % — ABNORMAL LOW (ref 36.0–46.0)
Hemoglobin: 10.2 g/dL — ABNORMAL LOW (ref 12.0–15.0)
MCH: 27.9 pg (ref 26.0–34.0)
MCHC: 30.5 g/dL (ref 30.0–36.0)
MCV: 91.5 fL (ref 80.0–100.0)
Platelets: 308 K/uL (ref 150–400)
RBC: 3.65 MIL/uL — ABNORMAL LOW (ref 3.87–5.11)
RDW: 16.2 % — ABNORMAL HIGH (ref 11.5–15.5)
WBC: 10.1 K/uL (ref 4.0–10.5)
nRBC: 0.2 % (ref 0.0–0.2)

## 2024-03-11 LAB — COMPREHENSIVE METABOLIC PANEL WITH GFR
ALT: 47 U/L — ABNORMAL HIGH (ref 0–44)
AST: 42 U/L — ABNORMAL HIGH (ref 15–41)
Albumin: 3.2 g/dL — ABNORMAL LOW (ref 3.5–5.0)
Alkaline Phosphatase: 90 U/L (ref 38–126)
Anion gap: 13 (ref 5–15)
BUN: 88 mg/dL — ABNORMAL HIGH (ref 8–23)
CO2: 21 mmol/L — ABNORMAL LOW (ref 22–32)
Calcium: 9.8 mg/dL (ref 8.9–10.3)
Chloride: 101 mmol/L (ref 98–111)
Creatinine, Ser: 4.33 mg/dL — ABNORMAL HIGH (ref 0.44–1.00)
GFR, Estimated: 11 mL/min — ABNORMAL LOW (ref >60)
Glucose, Bld: 161 mg/dL — ABNORMAL HIGH (ref 70–99)
Potassium: 4.2 mmol/L (ref 3.5–5.1)
Sodium: 135 mmol/L (ref 135–145)
Total Bilirubin: 0.5 mg/dL (ref 0.0–1.2)
Total Protein: 7.5 g/dL (ref 6.5–8.1)

## 2024-03-11 LAB — URINALYSIS, W/ REFLEX TO CULTURE (INFECTION SUSPECTED)
Bilirubin Urine: NEGATIVE
Glucose, UA: NEGATIVE mg/dL
Hgb urine dipstick: NEGATIVE
Ketones, ur: NEGATIVE mg/dL
Leukocytes,Ua: NEGATIVE
Nitrite: NEGATIVE
Protein, ur: 100 mg/dL — AB
Specific Gravity, Urine: 1.006 (ref 1.005–1.030)
pH: 5 (ref 5.0–8.0)

## 2024-03-11 NOTE — Discharge Instructions (Signed)
 Patient to go back to dialysis per your nephrology's recommendations.  Your CT imaging and ultrasound noted incidentally that you have hepatic steatosis.  Please make sure to follow-up with your primary care doctor for further management of the symptoms and findings.

## 2024-03-11 NOTE — ED Provider Notes (Signed)
 [facility] Provider Note    Event Date/Time   First MD Initiated Contact with Patient 03/11/24 2047     (approximate)   History   Abdominal Pain   HPI  Alicia Rogers is a 63 y.o. female history of ESRD on dialysis, diabetes, hypertension, schizoaffective disorder, presenting with abdominal pain.  Associated with some nausea vomiting.  Patient states that she is still able to pee.  Denies any diarrhea.  No chest pain or shortness of breath.  States that she did miss 2 dialysis sessions recently.  No fever.  Per independent history from EMS, her heart rate was 94, sat 98% on room air, blood glucose was 153.  Had missed 3 dialysis treatments in the past because she refused to go.   On independent review, she is seen by nephrology at the end of July, she was seen for follow-up for diabetic CKD, was on dialysis for about 2 to 3 months and then stopped going for dialysis.  Her last dialysis treatment was June 2025.  At her nephrologist office, she had no acute complaints.  Physical Exam   Triage Vital Signs: ED Triage Vitals  Encounter Vitals Group     BP 03/11/24 2050 (!) 176/59     Girls Systolic BP Percentile --      Girls Diastolic BP Percentile --      Boys Systolic BP Percentile --      Boys Diastolic BP Percentile --      Pulse Rate 03/11/24 2050 95     Resp 03/11/24 2050 20     Temp 03/11/24 2050 99.2 F (37.3 C)     Temp Source 03/11/24 2050 Oral     SpO2 03/11/24 2050 99 %     Weight 03/11/24 2052 134 lb 11.2 oz (61.1 kg)     Height 03/11/24 2052 5' 3 (1.6 m)     Head Circumference --      [facility] Flow --      Pain Score 03/11/24 2052 10     Pain Loc --      Pain Education --      Exclude from Growth Chart --     Most recent vital signs: Vitals:   03/11/24 2300 03/11/24 2330  BP: (!) 175/69 (!) 181/71  Pulse: 96 93  Resp:    Temp:    SpO2: 98% 97%     General: Awake, no distress.  CV:  Good peripheral perfusion.   Resp:  Normal effort.  No tachypnea or respiratory distress Abd:  No distention.  Abdomen soft, tender in the right lower quadrant without guarding, mildly tender to her upper quadrants. Other:  Bilateral lower extremity edema the appear symmetrical   ED Results / Procedures / Treatments   Labs (all labs ordered are listed, but only abnormal results are displayed) Labs Reviewed  COMPREHENSIVE METABOLIC PANEL WITH GFR - Abnormal; Notable for the following components:      Result Value   CO2 21 (*)    Glucose, Bld 161 (*)    BUN 88 (*)    Creatinine, Ser 4.33 (*)    Albumin 3.2 (*)    AST 42 (*)    ALT 47 (*)    GFR, Estimated 11 (*)    All other components within normal limits  CBC - Abnormal; Notable for the following components:   RBC 3.65 (*)    Hemoglobin 10.2 (*)    HCT 33.4 (*)  RDW 16.2 (*)    All other components within normal limits  URINALYSIS, W/ REFLEX TO CULTURE (INFECTION SUSPECTED) - Abnormal; Notable for the following components:   Color, Urine STRAW (*)    APPearance CLEAR (*)    Protein, ur 100 (*)    Bacteria, UA RARE (*)    All other components within normal limits     RADIOLOGY On my independent interpretation, right quadrant without obvious gallstones   PROCEDURES:  Critical Care performed: No  Procedures   MEDICATIONS ORDERED IN ED: Medications - No data to display   IMPRESSION / MDM / ASSESSMENT AND PLAN / ED COURSE  I reviewed the triage vital signs and the nursing notes.                              Differential diagnosis includes, but is not limited to, appendicitis, colitis, diverticulitis, electrolyte derangements, GERD, acid reflux, did consider biliary etiology given that she has mild right upper quadrant abdominal pain, it seems like her pain is worse at the right lower quadrant at this time, will proceed with CT scan first, labs, UA.  Patient's presentation is most consistent with acute presentation with potential threat to  life or bodily function.  Independent interpretation of labs and imaging below.  On reassessment patient's abdomen is soft nontender.  Discussed with her about imaging lab results including incidental findings.  Considered but no indication for inpatient admission at this time, she is here for outpatient management.  Discussed with her about following by the primary care doctor for further management of her mildly elevated LFTs as well as findings of hepatic steatosis.  Also discharged her to follow-up with her nephrologist for the management of her ESRD.  Shared decision making to patient and she is agreeable to this plan.  Will discharge with strict return precautions.  The patient is on the cardiac monitor to evaluate for evidence of arrhythmia and/or significant heart rate changes.   Clinical Course as of 03/11/24 2346  Sun Mar 11, 2024  2122 Independent review of labs, no leukocytosis, electrolytes not severely deranged, creatinine is elevated but she has history of ESRD on dialysis, LFTs are mildly elevated [TT]  2222 CT ABDOMEN PELVIS WO CONTRAST IMPRESSION: 1. Colonic diverticulosis with no acute diverticulitis. 2. Bilateral adrenal gland adenomas-no further follow-up indicated. 3.  Aortic Atherosclerosis (ICD10-I70.0).   [TT]  2337 US  ABDOMEN LIMITED RUQ (LIVER/GB) IMPRESSION: Fatty liver.  No acute abnormality noted.   [TT]    Clinical Course User Index [TT] Waymond, Lorelle Cummins, MD     FINAL CLINICAL IMPRESSION(S) / ED DIAGNOSES   Final diagnoses:  Generalized abdominal pain  Hepatic steatosis  ESRD (end stage renal disease) on dialysis [facility])     Rx / DC Orders   ED Discharge Orders     None        Note:  This document was prepared using Dragon voice recognition software and may include unintentional dictation errors.    Waymond Lorelle Cummins, MD 03/11/24 [phone]

## 2024-03-11 NOTE — ED Triage Notes (Addendum)
 Pt arrives via [facility] from [facility] for abdominal pain. Pt missed last 3 dialysis treatments because she refused. Pt ANOx4 on arrival  EMS vitals: 165/59 BP 94 HR 98% SPO2 153 CBG

## 2024-03-14 ENCOUNTER — Other Ambulatory Visit: Payer: Self-pay

## 2024-03-14 ENCOUNTER — Encounter: Payer: Self-pay | Admitting: *Deleted

## 2024-03-14 ENCOUNTER — Emergency Department

## 2024-03-14 ENCOUNTER — Emergency Department
Admission: EM | Admit: 2024-03-14 | Discharge: 2024-03-15 | Disposition: A | Discharge status: Home / Self Care | Attending: Emergency Medicine | Admitting: Emergency Medicine

## 2024-03-14 DIAGNOSIS — E869 Volume depletion, unspecified: Secondary | ICD-10-CM | POA: Insufficient documentation

## 2024-03-14 DIAGNOSIS — R55 Syncope and collapse: Secondary | ICD-10-CM | POA: Insufficient documentation

## 2024-03-14 DIAGNOSIS — N186 End stage renal disease: Secondary | ICD-10-CM | POA: Diagnosis not present

## 2024-03-14 DIAGNOSIS — R42 Dizziness and giddiness: Secondary | ICD-10-CM

## 2024-03-14 DIAGNOSIS — Z992 Dependence on renal dialysis: Secondary | ICD-10-CM | POA: Insufficient documentation

## 2024-03-14 DIAGNOSIS — E041 Nontoxic single thyroid nodule: Secondary | ICD-10-CM | POA: Diagnosis not present

## 2024-03-14 LAB — CBC
HCT: 37.9 % (ref 36.0–46.0)
Hemoglobin: 11.6 g/dL — ABNORMAL LOW (ref 12.0–15.0)
MCH: 27.6 pg (ref 26.0–34.0)
MCHC: 30.6 g/dL (ref 30.0–36.0)
MCV: 90 fL (ref 80.0–100.0)
Platelets: 351 K/uL (ref 150–400)
RBC: 4.21 MIL/uL (ref 3.87–5.11)
RDW: 16.4 % — ABNORMAL HIGH (ref 11.5–15.5)
WBC: 10.9 K/uL — ABNORMAL HIGH (ref 4.0–10.5)
nRBC: 0.4 % — ABNORMAL HIGH (ref 0.0–0.2)

## 2024-03-14 LAB — TROPONIN I (HIGH SENSITIVITY)
Troponin I (High Sensitivity): 13 ng/L (ref <18)
Troponin I (High Sensitivity): 15 ng/L (ref <18)

## 2024-03-14 LAB — URINALYSIS, ROUTINE W REFLEX MICROSCOPIC
Bilirubin Urine: NEGATIVE
Glucose, UA: NEGATIVE mg/dL
Hgb urine dipstick: NEGATIVE
Ketones, ur: NEGATIVE mg/dL
Leukocytes,Ua: NEGATIVE
Nitrite: NEGATIVE
Protein, ur: >=300 mg/dL — AB
Specific Gravity, Urine: 1.014 (ref 1.005–1.030)
pH: 5 (ref 5.0–8.0)

## 2024-03-14 LAB — BASIC METABOLIC PANEL WITH GFR
Anion gap: 14 (ref 5–15)
BUN: 34 mg/dL — ABNORMAL HIGH (ref 8–23)
CO2: 25 mmol/L (ref 22–32)
Calcium: 9.4 mg/dL (ref 8.9–10.3)
Chloride: 98 mmol/L (ref 98–111)
Creatinine, Ser: 2.8 mg/dL — ABNORMAL HIGH (ref 0.44–1.00)
GFR, Estimated: 18 mL/min — ABNORMAL LOW (ref >60)
Glucose, Bld: 146 mg/dL — ABNORMAL HIGH (ref 70–99)
Potassium: 3.8 mmol/L (ref 3.5–5.1)
Sodium: 137 mmol/L (ref 135–145)

## 2024-03-14 NOTE — ED Triage Notes (Signed)
 Pt to triage via wheelchair.  Pt has dizziness.  Pt had a near syncopal episode in the parking lot of dialysis.  Pt struck head on cement.  Abrasion noted to forehead.  No loc.  No vomiting.  No chest pain.  Pt became dizzy after dialysis.  Pt alert  speech clear. Pt lives at [facility].

## 2024-03-14 NOTE — ED Triage Notes (Signed)
 First Nurse Note:  Pt via [facility] from Dialysis, pt stays at [facility]. Pt c/o near syncopal episode in the parking lot of dialysis. States she did feel dizzy prior to this. Denies pain. Pt is A&Ox4 and NAD  EMS reports:  151/64 BP  92 HR  98% on RA  129 CBG

## 2024-03-14 NOTE — ED Provider Notes (Signed)
 [facility] Provider Note    Event Date/Time   First MD Initiated Contact with Patient 03/14/24 2331     (approximate)   History   Dizziness   HPI Alicia Rogers is a 63 y.o. female who is on hemodialysis Mondays, Wednesdays, and Fridays, and presents after nearly passing out and falling.  This occurred in the parking lot after her dialysis.  She has been on dialysis for about 4 months and she said that this is not happened before.  She otherwise feels fine.  She said that she is hungry because she has been here in the emergency department for the last 7+ hours but she does not want anything a right now.  She has no chest pain, shortness of breath, nausea, nor vomiting.  She has no numbness or weakness in her arms or her legs.  She has no headache or neck pain although she did strike the front of her forehead when she fell.  She did not lose consciousness.     Physical Exam   Triage Vital Signs: ED Triage Vitals  Encounter Vitals Group     BP 03/14/24 1641 (!) 149/56     Girls Systolic BP Percentile --      Girls Diastolic BP Percentile --      Boys Systolic BP Percentile --      Boys Diastolic BP Percentile --      Pulse Rate 03/14/24 1641 (!) 120     Resp 03/14/24 1641 20     Temp 03/14/24 1641 99 F (37.2 C)     Temp Source 03/14/24 1641 Oral     SpO2 03/14/24 1641 95 %     Weight 03/14/24 1636 58.1 kg (128 lb)     Height 03/14/24 1636 1.6 m (5' 3)     Head Circumference --      [facility] Flow --      Pain Score 03/14/24 1636 0     Pain Loc --      Pain Education --      Exclude from Growth Chart --     Most recent vital signs: Vitals:   03/14/24 2038 03/14/24 2333  BP: (!) 162/67 131/81  Pulse: (!) 110 100  Resp: 18 16  Temp: 98.6 F (37 C) 98 F (36.7 C)  SpO2: 100% 100%    General: Awake, no distress.  Alert and responds to questions although she does so slowly.  Her lips are quite dry but her intraoral mucosa are  moist. CV:  Good peripheral perfusion.  Borderline tachycardia, regular rhythm.  Normal heart sounds. Resp:  Normal effort. Speaking easily and comfortably, no accessory muscle usage nor intercostal retractions.  Lungs are clear to auscultation. Abd:  No distention.  No tenderness to palpation of the abdomen. Other:  No appreciable focal neurological deficits.   ED Results / Procedures / Treatments   Labs (all labs ordered are listed, but only abnormal results are displayed) Labs Reviewed  BASIC METABOLIC PANEL WITH GFR - Abnormal; Notable for the following components:      Result Value   Glucose, Bld 146 (*)    BUN 34 (*)    Creatinine, Ser 2.80 (*)    GFR, Estimated 18 (*)    All other components within normal limits  CBC - Abnormal; Notable for the following components:   WBC 10.9 (*)    Hemoglobin 11.6 (*)    RDW 16.4 (*)    nRBC 0.4 (*)  All other components within normal limits  URINALYSIS, ROUTINE W REFLEX MICROSCOPIC - Abnormal; Notable for the following components:   Color, Urine YELLOW (*)    APPearance HAZY (*)    Protein, ur >=300 (*)    Bacteria, UA MANY (*)    All other components within normal limits  URINE CULTURE  TROPONIN I (HIGH SENSITIVITY)  TROPONIN I (HIGH SENSITIVITY)     EKG  ED ECG REPORT I, Darleene Dome, the attending physician, personally viewed and interpreted this ECG.  Date: 03/14/2024 EKG Time: 16: 38 Rate: 95 Rhythm: normal sinus rhythm QRS Axis: normal Intervals: normal ST/T Wave abnormalities: Non-specific ST segment / T-wave changes, but no clear evidence of acute ischemia. Narrative Interpretation: no definitive evidence of acute ischemia; does not meet STEMI criteria.    RADIOLOGY I independently viewed and interpreted the patient's head CT and cervical spine CT. I see no evidence of acute intracranial hemorrhage or cervical spine injury.  Radiologist agreed no acute findings, but pointed out a thyroid  nodule appropriate for  outpatient follow-up.  I also viewed and interpreted the patient's 1 view chest x-ray.  I see no evidence of pneumonia or pulmonary edema.  Radiologist commented on a small pleural effusion, otherwise no acute findings.   PROCEDURES:  Critical Care performed: No  Procedures    IMPRESSION / MDM / ASSESSMENT AND PLAN / ED COURSE  I reviewed the triage vital signs and the nursing notes.                              Differential diagnosis includes, but is not limited to, volume depletion after dialysis, electrolyte abnormality, acute intracranial hemorrhage, cervical spine injury  Patient's presentation is most consistent with acute presentation with potential threat to life or bodily function.  Labs/studies ordered: CT head, CT cervical spine, chest x-ray, EKG, BMP, high-sensitivity troponin x 2, urinalysis, CBC  Interventions/Medications given:  Medications - No data to display  (Note:  hospital course my include additional interventions and/or labs/studies not listed above.)   Vital signs stable and within normal limits other than mild tachycardia.  Symptoms I believe are due to volume depletion after dialysis.  None of the labs were particularly worrisome or indicative of an emergent condition.  Creatinine is 2.8 but she is on dialysis.  She has bacteria in her urine but is otherwise asymptomatic and she does not urinate as regularly as she did prior to her renal failure and dialysis; I will send a urine culture but not treat empirically.  Troponin is normal and stable.  Patient is tolerating oral intake in the ED.  The patient's medical screening exam is reassuring with no indication of an emergent medical condition requiring hospitalization or additional evaluation at this point.  The patient is safe and appropriate for discharge and outpatient follow up.         FINAL CLINICAL IMPRESSION(S) / ED DIAGNOSES   Final diagnoses:  Lightheadedness  Volume depletion  Thyroid   nodule  Near syncope     Rx / DC Orders   ED Discharge Orders     None        Note:  This document was prepared using Dragon voice recognition software and may include unintentional dictation errors.   Dome Darleene, MD 03/15/24 [phone]

## 2024-03-14 NOTE — ED Notes (Signed)
 Pt provided water per request. States no other needs at this time

## 2024-03-15 NOTE — Discharge Instructions (Addendum)
 You have been seen today in the Emergency Department (ED)  for nearly passing out.  Your workup including labs and EKG show reassuring results.  Your symptoms are probably because you just had dialysis, so you did not have enough fluids in your body at the time.  It is important that you drink plenty of non-alcoholic fluids to get rehydrated.  Please call your regular doctor as soon as possible to schedule the next available clinic appointment to follow up with him/her regarding your visit to the ED and your symptoms.  Please also let them know that your CT scan of the head, while not showing any injuries from your fall, showed that you have a thyroid  nodule.  This may not constitute a serious medical condition, but it is important that you follow-up with your primary care doctor and talk about any additional outpatient studies such as an ultrasound that could be ordered for you.  Return to the Emergency Department (ED)  if you have any further syncopal episodes (pass out again) or develop ANY chest pain, pressure, tightness, trouble breathing, sudden sweating, or other symptoms that concern you.

## 2024-03-16 LAB — URINE CULTURE

## 2024-04-16 ENCOUNTER — Encounter: Payer: Self-pay | Admitting: Internal Medicine

## 2024-04-20 NOTE — Progress Notes (Signed)
 ANNUAL GYNECOLOGICAL EXAM  SUBJECTIVE  HPI  Alicia Rogers is a 63 y.o.-year-old G2P0020 who presents for an annual gynecological exam today. She reports that she has taken 5 pregnancy tests and they were all positive and that her stomach is getting bigger. She has a h/o hysterectomy with BSO and is not sexually active. She denies pelvic pain, abnormal vaginal bleeding or discharge, and UTI symptoms. She was brought to this visit but a transport person from the nursing home where she lives but did not have a caregiver present for the visit, and obtaining an accurate history was challenging.   Medical/Surgical History Past Medical History:  Diagnosis Date   Anemia    Aortic atherosclerosis    Bilateral adrenal adenomas    CKD (chronic kidney disease), stage V (HCC)    Depression    Diabetes mellitus, type II (HCC)    Diverticulosis    Hand swelling 09/27/2021   Hypertension    Paranoid schizophrenia (HCC)    Proteinuria    Schizophrenia (HCC)    Past Surgical History:  Procedure Laterality Date   A/V FISTULAGRAM Left 10/26/2023   Procedure: A/V Fistulagram;  Surgeon: Marea Selinda RAMAN, MD;  Location: [facility];  Service: Cardiovascular;  Laterality: Left;   AV FISTULA PLACEMENT Left 07/14/2023   Procedure: ARTERIOVENOUS (AV) FISTULA CREATION;  Surgeon: Marea Selinda RAMAN, MD;  Location: [facility];  Service: Vascular;  Laterality: Left;   COLONOSCOPY WITH PROPOFOL  N/A 07/21/2022   Procedure: COLONOSCOPY WITH PROPOFOL ;  Surgeon: Unk Corinn Skiff, MD;  Location: [facility];  Service: Gastroenterology;  Laterality: N/A;   colonscopy     ESOPHAGOGASTRODUODENOSCOPY (EGD) WITH PROPOFOL  N/A 07/21/2022   Procedure: ESOPHAGOGASTRODUODENOSCOPY (EGD) WITH PROPOFOL ;  Surgeon: Unk Corinn Skiff, MD;  Location: [facility];  Service: Gastroenterology;  Laterality: N/A;   LOWER EXTREMITY ANGIOGRAPHY Right 07/08/2023   Procedure: Lower Extremity Angiography;  Surgeon: Marea Selinda RAMAN,  MD;  Location: [facility];  Service: Cardiovascular;  Laterality: Right;    Social History Lives in nursing home Substances: Denies EtOH, tobacco, vape, and recreational drugs  Obstetric History OB History     Gravida  2   Para      Term      Preterm      AB  2   Living         SAB  1   IAB      Ectopic      Multiple      Live Births  0            GYN/Menstrual History S/p TVH and BSO Last Pap: Contraception: abstinence  Prevention Mammogram: recommend yearly Colonoscopy: follow with PCP   Current Medications Outpatient Medications Prior to Visit  Medication Sig   allopurinol  (ZYLOPRIM ) 100 MG tablet Take 100 mg by mouth daily.   Amino Acids (AMINO ACID PROTEIN PO) Take by mouth.   amLODipine  (NORVASC ) 10 MG tablet Take 1 tablet (10 mg total) by mouth daily.   ascorbic acid  (VITAMIN C ) 500 MG tablet Take 500 mg by mouth daily.   aspirin  EC 81 MG tablet Take 81 mg by mouth daily.   budesonide -glycopyrrolate-formoterol  (BREZTRI  AEROSPHERE) 160-9-4.8 MCG/ACT AERO inhaler Inhale 2 puffs into the lungs 2 (two) times daily.   [employer] (POLYOX WSR-301) POWD SMARTSIG:17 Gram(s) By Mouth Daily PRN (Patient not taking: Reported on 02/10/2024)   buPROPion  (WELLBUTRIN ) 100 MG tablet Take 100 mg by mouth daily.   carbamide peroxide (DEBROX)  6.5 % OTIC solution 5 drops 2 (two) times daily. (Patient not taking: Reported on 02/10/2024)   cetirizine (ZYRTEC) 5 MG tablet Take 5 mg by mouth daily.   cloNIDine  (CATAPRES ) 0.1 MG tablet Take 1 tablet (0.1 mg total) by mouth 3 (three) times daily.   collagenase  (SANTYL ) 250 UNIT/GM ointment Apply topically daily. (Patient not taking: Reported on 02/10/2024)   cyclobenzaprine  (FLEXERIL ) 10 MG tablet Take 1 tablet (10 mg total) by mouth at bedtime. (Patient taking differently: Take 10 mg by mouth at bedtime as needed.)   dapagliflozin  propanediol (FARXIGA ) 5 MG TABS tablet Take 5 mg by mouth daily.    ergocalciferol  (VITAMIN D2) 1.25 MG (50000 UT) capsule Take 50,000 Units by mouth once a week. On Fridays   escitalopram  (LEXAPRO ) 20 MG tablet Take 20 mg by mouth daily.   feeding supplement (ENSURE ENLIVE / ENSURE PLUS) LIQD Take 237 mLs by mouth 2 (two) times daily between meals.   ferrous sulfate  325 (65 FE) MG tablet Take 325 mg by mouth every other day. Take with a meal.   fluticasone  (FLONASE ) 50 MCG/ACT nasal spray Place 2 sprays into both nostrils daily.   furosemide  (LASIX ) 20 MG tablet Take 1 tablet (20 mg total) by mouth every other day. (Patient taking differently: Take 40 mg by mouth every other day.)   gabapentin  (NEURONTIN ) 100 MG capsule Take 100 mg by mouth 3 (three) times daily.   hydrALAZINE  (APRESOLINE ) 50 MG tablet Take 1 tablet (50 mg total) by mouth 3 (three) times daily.   HYDROcodone -acetaminophen  (NORCO/VICODIN) 5-325 MG tablet Take 2 tablets by mouth every 6 (six) hours as needed for moderate pain (pain score 4-6).   isosorbide  mononitrate (IMDUR ) 60 MG 24 hr tablet Take 1 tablet (60 mg total) by mouth at bedtime. Hold if SBP <130   ketoconazole  (NIZORAL ) 2 % cream Apply 1 Application topically daily.   lactulose (CEPHULAC) 10 g packet Take 10 g by mouth 2 (two) times daily.   lidocaine -prilocaine (EMLA) cream Apply 1 Application topically once. On Tues, Thurs, Sat   linagliptin  (TRADJENTA ) 5 MG TABS tablet Take 5 mg by mouth daily.   lip balm (BLISTEX) OINT Apply 1 Application topically as needed for lip care.   loperamide  (IMODIUM ) 2 MG capsule Take 2 mg by mouth 2 (two) times daily as needed for diarrhea or loose stools. (Patient not taking: Reported on 02/10/2024)   losartan  (COZAAR ) 100 MG tablet Take 100 mg by mouth daily.   melatonin 3 MG TABS tablet Take 2 tablets (6 mg total) by mouth at bedtime as needed.   metolazone (ZAROXOLYN) 5 MG tablet Take 5 mg by mouth daily.   Multiple Vitamin (MULTIVITAMIN) capsule Take 1 capsule by mouth daily.   naloxone (NARCAN)  nasal spray 4 mg/0.1 mL Place 1 spray into the nose 3 (three) times daily as needed.   nystatin (MYCOSTATIN) 100000 UNIT/ML suspension Take by mouth. (Patient not taking: Reported on 02/10/2024)   omeprazole  (PRILOSEC) 20 MG capsule Take 20 mg by mouth daily.   omeprazole  (PRILOSEC) 40 MG capsule Take 40 mg by mouth daily. (Patient not taking: Reported on 02/10/2024)   Paliperidone  ER (INVEGA  SUSTENNA) injection Inject 78 mg into the muscle every 30 (thirty) days.   polyethylene glycol powder (GLYCOLAX /MIRALAX ) 17 GM/SCOOP powder Take 17 g by mouth daily as needed.   sennosides-docusate sodium  (SENOKOT-S) 8.6-50 MG tablet Take 1 tablet by mouth daily.   sodium zirconium cyclosilicate  (LOKELMA ) 10 g PACK packet Take 10 g by  mouth daily.   TYLENOL  325 MG tablet Take 650 mg by mouth every 8 (eight) hours as needed for mild pain. *DO NOT EXCEED 4GM OF TYLENOL  IN 24 HOURS* (Patient not taking: Reported on 02/10/2024)   Zinc  Sulfate 220 (50 Zn) MG TABS Take by mouth. (Patient not taking: Reported on 02/10/2024)   No facility-administered medications prior to visit.        ROS Constitutional: Denied constitutional symptoms, night sweats, recent illness, fatigue, fever, insomnia and weight loss.  Eyes: Denied eye symptoms, eye pain, photophobia, vision change and visual disturbance.  Ears/Nose/Throat/Neck: Denied ear, nose, throat or neck symptoms, hearing loss, nasal discharge, sinus congestion and sore throat.  Cardiovascular: Denied cardiovascular symptoms, arrhythmia, chest pain/pressure, edema, exercise intolerance, orthopnea and palpitations.  Respiratory: Denied pulmonary symptoms, asthma, pleuritic pain, productive sputum, cough, dyspnea and wheezing.  Gastrointestinal: Denied gastro-esophageal reflux, melena, nausea and vomiting.  Genitourinary: See HPI  Musculoskeletal: Denied musculoskeletal symptoms, stiffness, swelling, muscle weakness and myalgia.  Dermatologic: Denied dermatology  symptoms, rash and scar.  Neurologic: Denied neurology symptoms, dizziness, headache, neck pain and syncope.  Psychiatric: Denied psychiatric symptoms, anxiety and depression.  Endocrine: Denied endocrine symptoms including hot flashes and night sweats.    OBJECTIVE  BP 115/65   Pulse 65   Ht 5' 3 (1.6 m)   Wt 123 lb (55.8 kg)   BMI 21.79 kg/m    Physical examination General NAD, Conversant  HEENT Atraumatic; Op clear with mmm.  Normo-cephalic. Pupils reactive. Anicteric sclerae  Thyroid /Neck Smooth without nodularity or enlargement. Normal ROM.  Neck Supple.  Skin No rashes, lesions or ulceration. Normal palpated skin turgor. No nodularity.  Breasts: Declined  Lungs: Clear to auscultation.No rales or wheezes. Normal Respiratory effort, no retractions.  Heart: NSR.  No murmurs or rubs appreciated. No peripheral edema  Abdomen: Soft.  Non-tender.  No masses.  No HSM. No hernia  Extremities: Moves all appropriately.  Normal ROM for age. No lymphadenopathy.  Neuro: Oriented to PPT.  Normal mood. Normal affect.     Pelvic:  Declined    ASSESSMENT  1) Annual exam 2) Delusion of pregnancy  PLAN 1) Physical exam as noted. Mammogram ordered. Declines labs. 2) Reviewed with Ms. Randle that she cannot be pregnant without a uterus. Do not recommend pelvic US  for stomach growth since her uterus and ovaries are surgically absent. Recommend scheduling visit with PCP or GI for further assessment if indicated.  Return in one year for annual exam or as needed for concerns.   Shawntia Mangal, CNM

## 2024-04-23 ENCOUNTER — Ambulatory Visit (INDEPENDENT_AMBULATORY_CARE_PROVIDER_SITE_OTHER): Admitting: Obstetrics

## 2024-04-23 ENCOUNTER — Encounter: Payer: Self-pay | Admitting: Obstetrics

## 2024-04-23 VITALS — BP 115/65 | HR 65 | Ht 63.0 in | Wt 123.0 lb

## 2024-04-23 DIAGNOSIS — Z1231 Encounter for screening mammogram for malignant neoplasm of breast: Secondary | ICD-10-CM

## 2024-04-23 DIAGNOSIS — Z7689 Persons encountering health services in other specified circumstances: Secondary | ICD-10-CM

## 2024-04-23 DIAGNOSIS — Z0000 Encounter for general adult medical examination without abnormal findings: Secondary | ICD-10-CM

## 2024-05-07 ENCOUNTER — Encounter: Payer: Self-pay | Admitting: Internal Medicine

## 2024-05-14 ENCOUNTER — Ambulatory Visit (INDEPENDENT_AMBULATORY_CARE_PROVIDER_SITE_OTHER): Admitting: Podiatry

## 2024-05-14 DIAGNOSIS — Z91199 Patient's noncompliance with other medical treatment and regimen due to unspecified reason: Secondary | ICD-10-CM

## 2024-05-14 NOTE — Progress Notes (Signed)
 1. No-show for appointment

## 2024-05-25 ENCOUNTER — Emergency Department
Admission: EM | Admit: 2024-05-25 | Discharge: 2024-05-28 | Disposition: A | Attending: Emergency Medicine | Admitting: Emergency Medicine

## 2024-05-25 ENCOUNTER — Other Ambulatory Visit: Payer: Self-pay

## 2024-05-25 DIAGNOSIS — Z0000 Encounter for general adult medical examination without abnormal findings: Secondary | ICD-10-CM | POA: Diagnosis not present

## 2024-05-25 DIAGNOSIS — N186 End stage renal disease: Secondary | ICD-10-CM | POA: Insufficient documentation

## 2024-05-25 DIAGNOSIS — Z992 Dependence on renal dialysis: Secondary | ICD-10-CM | POA: Diagnosis not present

## 2024-05-25 DIAGNOSIS — I120 Hypertensive chronic kidney disease with stage 5 chronic kidney disease or end stage renal disease: Secondary | ICD-10-CM | POA: Diagnosis present

## 2024-05-25 DIAGNOSIS — E1122 Type 2 diabetes mellitus with diabetic chronic kidney disease: Secondary | ICD-10-CM | POA: Insufficient documentation

## 2024-05-25 DIAGNOSIS — D72829 Elevated white blood cell count, unspecified: Secondary | ICD-10-CM | POA: Insufficient documentation

## 2024-05-25 DIAGNOSIS — I10 Essential (primary) hypertension: Secondary | ICD-10-CM

## 2024-05-25 LAB — CBC WITH DIFFERENTIAL/PLATELET
Abs Immature Granulocytes: 0.07 K/uL (ref 0.00–0.07)
Basophils Absolute: 0 K/uL (ref 0.0–0.1)
Basophils Relative: 0 %
Eosinophils Absolute: 0.2 K/uL (ref 0.0–0.5)
Eosinophils Relative: 1 %
HCT: 33.5 % — ABNORMAL LOW (ref 36.0–46.0)
Hemoglobin: 9.9 g/dL — ABNORMAL LOW (ref 12.0–15.0)
Immature Granulocytes: 1 %
Lymphocytes Relative: 18 %
Lymphs Abs: 2.3 K/uL (ref 0.7–4.0)
MCH: 28.8 pg (ref 26.0–34.0)
MCHC: 29.6 g/dL — ABNORMAL LOW (ref 30.0–36.0)
MCV: 97.4 fL (ref 80.0–100.0)
Monocytes Absolute: 1.2 K/uL — ABNORMAL HIGH (ref 0.1–1.0)
Monocytes Relative: 9 %
Neutro Abs: 9.5 K/uL — ABNORMAL HIGH (ref 1.7–7.7)
Neutrophils Relative %: 71 %
Platelets: 362 K/uL (ref 150–400)
RBC: 3.44 MIL/uL — ABNORMAL LOW (ref 3.87–5.11)
RDW: 17.2 % — ABNORMAL HIGH (ref 11.5–15.5)
WBC: 13.2 K/uL — ABNORMAL HIGH (ref 4.0–10.5)
nRBC: 0.2 % (ref 0.0–0.2)

## 2024-05-25 LAB — COMPREHENSIVE METABOLIC PANEL WITH GFR
ALT: 17 U/L (ref 0–44)
AST: 20 U/L (ref 15–41)
Albumin: 3.5 g/dL (ref 3.5–5.0)
Alkaline Phosphatase: 91 U/L (ref 38–126)
Anion gap: 14 (ref 5–15)
BUN: 43 mg/dL — ABNORMAL HIGH (ref 8–23)
CO2: 23 mmol/L (ref 22–32)
Calcium: 9.9 mg/dL (ref 8.9–10.3)
Chloride: 105 mmol/L (ref 98–111)
Creatinine, Ser: 3.38 mg/dL — ABNORMAL HIGH (ref 0.44–1.00)
GFR, Estimated: 15 mL/min — ABNORMAL LOW (ref >60)
Glucose, Bld: 122 mg/dL — ABNORMAL HIGH (ref 70–99)
Potassium: 3.5 mmol/L (ref 3.5–5.1)
Sodium: 142 mmol/L (ref 135–145)
Total Bilirubin: 0.5 mg/dL (ref 0.0–1.2)
Total Protein: 8.1 g/dL (ref 6.5–8.1)

## 2024-05-25 LAB — TROPONIN I (HIGH SENSITIVITY)
Troponin I (High Sensitivity): 21 ng/L — ABNORMAL HIGH (ref <18)
Troponin I (High Sensitivity): 21 ng/L — ABNORMAL HIGH (ref <18)

## 2024-05-25 NOTE — ED Triage Notes (Signed)
 States missed noon dose of HTN meds because caregiver would not give. When asked if patient feel safe at home, pt states somewhat, denies being hurt at home. Reports feeling like might need more care and support.

## 2024-05-25 NOTE — ED Provider Notes (Signed)
 Alicia Rogers Provider Note    Event Date/Time   First MD Initiated Contact with Patient 05/25/24 2210     (approximate)   History   Hypertension   HPI  Alicia Rogers is a 63 y.o. 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  Patient denies any injuries, no chest pain or shortness of breath, states that she missed her noon dose of her blood pressure medications because her caregiver would not give it.  Patient states that she lives with a caregiver, caregiver did give her morning medications but did not give her afternoon pain.  States that she was not told why her medications were not given.  States that in the past she had intermittent lightheadedness but no lightheadedness now.    Per dependent history from EMS, patient is coming from home for not feeling well, they report that she does not appear to be treated well where she lives and was kicked out of her prior facility.     Physical Exam   Triage Vital Signs: ED Triage Vitals [05/25/24 1836]  Encounter Vitals Group     BP (!) 179/69     Girls Systolic BP Percentile      Girls Diastolic BP Percentile      Boys Systolic BP Percentile      Boys Diastolic BP Percentile      Pulse Rate 98     Resp 18     Temp 98.5 F (36.9 C)     Temp src      SpO2 100 %     Weight 140 lb (63.5 kg)     Height 5' 3 (1.6 m)     Head Circumference      [facility] Flow      Pain Score 0     Pain Loc      Pain Education      Exclude from Growth Chart     Most recent vital signs: Vitals:   05/25/24 2230 05/25/24 2300  BP: (!) 177/69 (!) 183/69  Pulse: 90 87  Resp:    Temp:    SpO2: 100% 100%     General: Awake, no distress.  CV:  Good peripheral perfusion.  Resp:  Normal effort.  Abd:  No distention.  Soft nontender Other:  Mildly dry oral mucosa   ED Results / Procedures / Treatments   Labs (all labs  ordered are listed, but only abnormal results are displayed) Labs Reviewed  CBC WITH DIFFERENTIAL/PLATELET - Abnormal; Notable for the following components:      Result Value   WBC 13.2 (*)    RBC 3.44 (*)    Hemoglobin 9.9 (*)    HCT 33.5 (*)    MCHC 29.6 (*)    RDW 17.2 (*)    Neutro Abs 9.5 (*)    Monocytes Absolute 1.2 (*)    All other components within normal limits  COMPREHENSIVE METABOLIC PANEL WITH GFR - Abnormal; Notable for the following components:   Glucose, Bld 122 (*)    BUN 43 (*)    Creatinine, Ser 3.38 (*)    GFR, Estimated 15 (*)    All other components within normal limits  TROPONIN I (HIGH SENSITIVITY) - Abnormal; Notable for the following components:   Troponin I (High Sensitivity) 21 (*)    All other components within normal limits  TROPONIN I (HIGH SENSITIVITY)     EKG  EKG shows, sinus rhythm, rate 97, normal QS, normal QTc, no obvious ischemic ST elevation, T wave flattening in aVL, 1, not significant compared to prior     PROCEDURES:  Critical Care performed: No  Procedures   MEDICATIONS ORDERED IN ED: Medications - No data to display   IMPRESSION / MDM / ASSESSMENT AND PLAN / ED COURSE  I reviewed the triage vital signs and the nursing notes.                              Differential diagnosis includes, but is not limited to, hypertension, EMS had some concerns about safe discharge home.  Will get labs, pending medically clear for social work and TOC.  Will give her some p.o. hydration as well.  Patient's presentation is most consistent with acute presentation with potential threat to life or bodily function.  Independent interpretation of labs below.  Patient signed out pending repeat troponin, likely needs to be kept here for social work/TOC.    Clinical Course as of 05/25/24 2332  Fri May 25, 2024  2225 Independent review of labs, troponins mildly elevated, mild leukocytosis but she has no infectious symptoms at this time,  electrolytes not severely deranged, creatinine is elevated but she has history of ESRD on dialysis, LFTs are normal. [TT]  2314 Tried to call sister who is listed as contact but no reply. [TT]    Clinical Course User Index [TT] Waymond Lorelle Cummins, MD     FINAL CLINICAL IMPRESSION(S) / ED DIAGNOSES   Final diagnoses:  Hypertension, unspecified type  Evaluation by medical service required     Rx / DC Orders   ED Discharge Orders     None        Note:  This document was prepared using Dragon voice recognition software and may include unintentional dictation errors.    Waymond Lorelle Cummins, MD 05/25/24 [phone]

## 2024-05-25 NOTE — ED Provider Notes (Signed)
 ----------------------------------------- 11:29 PM on 05/25/2024 -----------------------------------------  Assuming care from Dr. Waymond.  In short, Alicia Rogers is a 63 y.o. female with a chief complaint of possible safety issue at home.  Concern she is not being cared for appropriately at her home.  Refer to the original H&P for additional details.  The current plan of care is to follow up on repeat troponin.  Concern about situation at home may lead to need for TOC involvement.   Clinical Course as of 05/26/24 0536  Fri May 25, 2024  2225 Independent review of labs, troponins mildly elevated, mild leukocytosis but she has no infectious symptoms at this time, electrolytes not severely deranged, creatinine is elevated but she has history of ESRD on dialysis, LFTs are normal. [TT]  2314 Tried to call sister who is listed as contact but no reply. [TT]  Sat May 26, 2024  0000 Troponin I (High Sensitivity)(!): 21 Stable high sensitivity troponin.   [CF]  0004 Given reported/documented concerns about the patient's safety, and no acute medical issues for which to admit her to the hospitalist service, I will place her in ED Boarder status for evaluation by TOC as per the previously described plan by the initial EDP. [CF]  [phone] Meds reconciled and I ordered his daily medications [CF]    Clinical Course User Index [CF] Gordan Huxley, MD [TT] Waymond Lorelle Cummins, MD     Medications  allopurinol  (ZYLOPRIM ) tablet 100 mg (has no administration in time range)  amLODipine  (NORVASC ) tablet 10 mg (has no administration in time range)  ascorbic acid  (VITAMIN C ) tablet 500 mg (has no administration in time range)  aspirin  EC tablet 81 mg (has no administration in time range)  budesonide -glycopyrrolate-formoterol  (BREZTRI ) 160-9-4.8 MCG/ACT inhaler 2 puff (2 puffs Inhalation Not Given 05/26/24 0259)  buPROPion  (WELLBUTRIN ) tablet 100 mg (has no administration in time range)  loratadine (CLARITIN) tablet  10 mg (has no administration in time range)  cloNIDine  (CATAPRES ) tablet 0.1 mg (has no administration in time range)  cyclobenzaprine  (FLEXERIL ) tablet 10 mg (has no administration in time range)  dapagliflozin  propanediol (FARXIGA ) tablet 5 mg (has no administration in time range)  Vitamin D  (Ergocalciferol ) (DRISDOL) 1.25 MG (50000 UNIT) capsule 50,000 Units (has no administration in time range)  escitalopram  (LEXAPRO ) tablet 20 mg (has no administration in time range)  feeding supplement (ENSURE ENLIVE / ENSURE PLUS) liquid 237 mL (has no administration in time range)  ferrous sulfate  tablet 325 mg (has no administration in time range)  furosemide  (LASIX ) tablet 40 mg (has no administration in time range)  gabapentin  (NEURONTIN ) capsule 100 mg (has no administration in time range)  hydrALAZINE  (APRESOLINE ) tablet 25 mg (has no administration in time range)  HYDROcodone -acetaminophen  (NORCO/VICODIN) 5-325 MG per tablet 2 tablet (has no administration in time range)  isosorbide  mononitrate (IMDUR ) 24 hr tablet 60 mg (has no administration in time range)  linagliptin  (TRADJENTA ) tablet 5 mg (has no administration in time range)  lip balm (CARMEX) ointment 1 Application (has no administration in time range)  losartan  (COZAAR ) tablet 100 mg (has no administration in time range)  melatonin tablet 5 mg (has no administration in time range)  metolazone (ZAROXOLYN) tablet 5 mg (has no administration in time range)  multivitamin with minerals tablet 1 tablet (has no administration in time range)  pantoprazole  (PROTONIX ) EC tablet 40 mg (has no administration in time range)  polyethylene glycol (MIRALAX  / GLYCOLAX ) packet 17 g (has no administration in time range)  senna-docusate (Senokot-S)  tablet 1 tablet (has no administration in time range)  sodium zirconium cyclosilicate  (LOKELMA ) packet 10 g (has no administration in time range)     ED Discharge Orders     None      Final diagnoses:   Hypertension, unspecified type  Evaluation by medical service required     Gordan Huxley, MD 05/26/24 [phone]

## 2024-05-25 NOTE — ED Triage Notes (Signed)
 First nurse note: pt to ED [facility] from home for not feeling well. Ems reports pt does not appear to be treated well where she lives and was kicked out of [facility]. Pt hypertensive with EMS, EMS reports concerns for safe discharge.

## 2024-05-26 DIAGNOSIS — I120 Hypertensive chronic kidney disease with stage 5 chronic kidney disease or end stage renal disease: Secondary | ICD-10-CM | POA: Diagnosis not present

## 2024-05-26 MED ORDER — DAPAGLIFLOZIN PROPANEDIOL 5 MG PO TABS
5.0000 mg | ORAL_TABLET | Freq: Every day | ORAL | Status: DC
  Administered 2024-05-26 – 2024-05-28 (×3): 5 mg via ORAL
  Filled 2024-05-26 (×3): qty 1

## 2024-05-26 MED ORDER — VITAMIN D (ERGOCALCIFEROL) 1.25 MG (50000 UNIT) PO CAPS: 50000.0000 [IU] | ORAL_CAPSULE | ORAL | Status: DC

## 2024-05-26 MED ORDER — LINAGLIPTIN 5 MG PO TABS
5.0000 mg | ORAL_TABLET | Freq: Every day | ORAL | Status: DC
  Administered 2024-05-26 – 2024-05-28 (×3): 5 mg via ORAL
  Filled 2024-05-26 (×3): qty 1

## 2024-05-26 MED ORDER — VITAMIN C 500 MG PO TABS
500.0000 mg | ORAL_TABLET | Freq: Every day | ORAL | Status: DC
  Administered 2024-05-26 – 2024-05-28 (×3): 500 mg via ORAL
  Filled 2024-05-26 (×3): qty 1

## 2024-05-26 MED ORDER — HYDROCODONE-ACETAMINOPHEN 5-325 MG PO TABS
2.0000 | ORAL_TABLET | Freq: Four times a day (QID) | ORAL | Status: DC | PRN
  Administered 2024-05-27 – 2024-05-28 (×2): 2 via ORAL
  Filled 2024-05-26 (×2): qty 2

## 2024-05-26 MED ORDER — PANTOPRAZOLE SODIUM 40 MG PO TBEC
40.0000 mg | DELAYED_RELEASE_TABLET | Freq: Every day | ORAL | Status: DC
  Administered 2024-05-26 – 2024-05-28 (×3): 40 mg via ORAL
  Filled 2024-05-26 (×3): qty 1

## 2024-05-26 MED ORDER — BUPROPION HCL 100 MG PO TABS
100.0000 mg | ORAL_TABLET | Freq: Every day | ORAL | Status: DC
  Administered 2024-05-26 – 2024-05-28 (×3): 100 mg via ORAL
  Filled 2024-05-26 (×3): qty 1

## 2024-05-26 MED ORDER — AMLODIPINE BESYLATE 5 MG PO TABS
10.0000 mg | ORAL_TABLET | Freq: Every day | ORAL | Status: DC
  Administered 2024-05-26 – 2024-05-28 (×3): 10 mg via ORAL
  Filled 2024-05-26 (×3): qty 2

## 2024-05-26 MED ORDER — SODIUM ZIRCONIUM CYCLOSILICATE 10 G PO PACK
10.0000 g | PACK | Freq: Every day | ORAL | Status: DC
  Filled 2024-05-26: qty 1

## 2024-05-26 MED ORDER — FUROSEMIDE 40 MG PO TABS
40.0000 mg | ORAL_TABLET | Freq: Two times a day (BID) | ORAL | Status: DC
  Administered 2024-05-26 – 2024-05-28 (×5): 40 mg via ORAL
  Filled 2024-05-26 (×5): qty 1

## 2024-05-26 MED ORDER — ASPIRIN 81 MG PO TBEC
81.0000 mg | DELAYED_RELEASE_TABLET | Freq: Every day | ORAL | Status: DC
  Administered 2024-05-26 – 2024-05-28 (×3): 81 mg via ORAL
  Filled 2024-05-26 (×3): qty 1

## 2024-05-26 MED ORDER — LOSARTAN POTASSIUM 50 MG PO TABS
100.0000 mg | ORAL_TABLET | Freq: Every day | ORAL | Status: DC
  Administered 2024-05-26 – 2024-05-28 (×3): 100 mg via ORAL
  Filled 2024-05-26 (×3): qty 2

## 2024-05-26 MED ORDER — CLONIDINE HCL 0.1 MG PO TABS
0.1000 mg | ORAL_TABLET | Freq: Three times a day (TID) | ORAL | Status: DC
  Administered 2024-05-26 – 2024-05-28 (×7): 0.1 mg via ORAL
  Filled 2024-05-26 (×7): qty 1

## 2024-05-26 MED ORDER — HYDRALAZINE HCL 50 MG PO TABS
25.0000 mg | ORAL_TABLET | Freq: Three times a day (TID) | ORAL | Status: DC
  Administered 2024-05-26 – 2024-05-28 (×7): 25 mg via ORAL
  Filled 2024-05-26 (×7): qty 1

## 2024-05-26 MED ORDER — ISOSORBIDE MONONITRATE ER 60 MG PO TB24
60.0000 mg | ORAL_TABLET | Freq: Every day | ORAL | Status: DC
  Administered 2024-05-26 – 2024-05-27 (×2): 60 mg via ORAL
  Filled 2024-05-26 (×2): qty 1

## 2024-05-26 MED ORDER — LORATADINE 10 MG PO TABS
10.0000 mg | ORAL_TABLET | Freq: Every day | ORAL | Status: DC
  Administered 2024-05-26 – 2024-05-28 (×3): 10 mg via ORAL
  Filled 2024-05-26 (×3): qty 1

## 2024-05-26 MED ORDER — CARMEX CLASSIC LIP BALM EX OINT
1.0000 | TOPICAL_OINTMENT | CUTANEOUS | Status: DC | PRN
Start: 2024-05-26 — End: 2024-05-28

## 2024-05-26 MED ORDER — GABAPENTIN 100 MG PO CAPS
100.0000 mg | ORAL_CAPSULE | Freq: Three times a day (TID) | ORAL | Status: DC
  Administered 2024-05-26 – 2024-05-28 (×7): 100 mg via ORAL
  Filled 2024-05-26 (×7): qty 1

## 2024-05-26 MED ORDER — BUDESON-GLYCOPYRROL-FORMOTEROL 160-9-4.8 MCG/ACT IN AERO
2.0000 | INHALATION_SPRAY | Freq: Two times a day (BID) | RESPIRATORY_TRACT | Status: DC
  Filled 2024-05-26: qty 5.9

## 2024-05-26 MED ORDER — ADULT MULTIVITAMIN W/MINERALS CH
1.0000 | ORAL_TABLET | Freq: Every day | ORAL | Status: DC
  Administered 2024-05-26 – 2024-05-28 (×3): 1 via ORAL
  Filled 2024-05-26 (×3): qty 1

## 2024-05-26 MED ORDER — ALLOPURINOL 100 MG PO TABS
100.0000 mg | ORAL_TABLET | Freq: Every day | ORAL | Status: DC
  Administered 2024-05-26 – 2024-05-28 (×3): 100 mg via ORAL
  Filled 2024-05-26 (×3): qty 1

## 2024-05-26 MED ORDER — MELATONIN 5 MG PO TABS: 5.0000 mg | ORAL_TABLET | Freq: Every evening | ORAL | Status: DC | PRN

## 2024-05-26 MED ORDER — CYCLOBENZAPRINE HCL 10 MG PO TABS
10.0000 mg | ORAL_TABLET | Freq: Every evening | ORAL | Status: DC | PRN
Start: 2024-05-26 — End: 2024-05-28

## 2024-05-26 MED ORDER — ESCITALOPRAM OXALATE 10 MG PO TABS
20.0000 mg | ORAL_TABLET | Freq: Every day | ORAL | Status: DC
  Administered 2024-05-26 – 2024-05-28 (×3): 20 mg via ORAL
  Filled 2024-05-26 (×3): qty 2

## 2024-05-26 MED ORDER — FERROUS SULFATE 325 (65 FE) MG PO TABS
325.0000 mg | ORAL_TABLET | ORAL | Status: DC
  Administered 2024-05-26 – 2024-05-28 (×2): 325 mg via ORAL
  Filled 2024-05-26 (×3): qty 1

## 2024-05-26 MED ORDER — POLYETHYLENE GLYCOL 3350 17 G PO PACK: 17.0000 g | PACK | Freq: Every day | ORAL | Status: DC | PRN

## 2024-05-26 MED ORDER — METOLAZONE 5 MG PO TABS
5.0000 mg | ORAL_TABLET | Freq: Every day | ORAL | Status: DC
  Administered 2024-05-26 – 2024-05-28 (×3): 5 mg via ORAL
  Filled 2024-05-26 (×3): qty 1

## 2024-05-26 MED ORDER — ENSURE ENLIVE PO LIQD
237.0000 mL | Freq: Two times a day (BID) | ORAL | Status: DC
  Administered 2024-05-26: 237 mL via ORAL
  Filled 2024-05-26: qty 237

## 2024-05-26 MED ORDER — SENNOSIDES-DOCUSATE SODIUM 8.6-50 MG PO TABS
1.0000 | ORAL_TABLET | Freq: Every day | ORAL | Status: DC
  Administered 2024-05-26 – 2024-05-28 (×3): 1 via ORAL
  Filled 2024-05-26 (×3): qty 1

## 2024-05-26 NOTE — ED Provider Notes (Signed)
 Emergency Medicine Observation Re-evaluation Note  Alicia Rogers is a 63 y.o. female, seen on rounds today.  Pt initially presented to the ED for complaints of Hypertension Currently, the patient is resting.  Physical Exam  BP 138/62   Pulse 89   Temp 98.8 F (37.1 C) (Oral)   Resp 17   Ht 5' 3 (1.6 m)   Wt 63.5 kg   SpO2 100%   BMI 24.80 kg/m  Physical Exam .Gen:  No acute distress Resp:  Breathing easily and comfortably, no accessory muscle usage Neuro:  Moving all four extremities, no gross focal neuro deficits Psych:  Resting currently, calm when awake   ED Course / MDM  EKG:   I have reviewed the labs performed to date as well as medications administered while in observation.  Recent changes in the last 24 hours include no acute events.  Plan  Current plan is for pending TOC/social work dispo.    Waymond Lorelle Cummins, MD 05/26/24 [phone]
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
## 2024-05-27 DIAGNOSIS — I120 Hypertensive chronic kidney disease with stage 5 chronic kidney disease or end stage renal disease: Secondary | ICD-10-CM | POA: Diagnosis not present
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
## 2024-05-27 NOTE — ED Provider Notes (Signed)
 ED Boarding Reassessment Note - Medical/SNF Hold Time/Date: 8:51 PM on 05/27/2024 Provider: Rolland Moats, MD    Event Date/Time   First MD Initiated Contact with Patient 05/27/24 1533     (approximate)   Reason for Re-evaluation: Continued ED stay pending post-acute placement: [SNF / rehab /   Interval History: Patient remains in ED awaiting safe disposition. Reports [no new complaints].  Nursing notes [no acute events / patient resting comfortably / ongoing care needs]. Overnight events: [none   Objective:  Vitals:   05/26/24 2116 05/27/24 1132  BP: 125/67 (!) 171/68  Pulse: 90 94  Resp: 16 16  Temp: 98.2 F (36.8 C) 98.2 F (36.8 C)  SpO2: 100% 97%    General: resting  Cardiac: WWP Lungs: No distress Neuro: grossly intact   Data Reviewed: Labs Reviewed  CBC WITH DIFFERENTIAL/PLATELET - Abnormal; Notable for the following components:      Result Value   WBC 13.2 (*)    RBC 3.44 (*)    Hemoglobin 9.9 (*)    HCT 33.5 (*)    MCHC 29.6 (*)    RDW 17.2 (*)    Neutro Abs 9.5 (*)    Monocytes Absolute 1.2 (*)    All other components within normal limits  COMPREHENSIVE METABOLIC PANEL WITH GFR - Abnormal; Notable for the following components:   Glucose, Bld 122 (*)    BUN 43 (*)    Creatinine, Ser 3.38 (*)    GFR, Estimated 15 (*)    All other components within normal limits  TROPONIN I (HIGH SENSITIVITY) - Abnormal; Notable for the following components:   Troponin I (High Sensitivity) 21 (*)    All other components within normal limits  TROPONIN I (HIGH SENSITIVITY) - Abnormal; Notable for the following components:   Troponin I (High Sensitivity) 21 (*)    All other components within normal limits    [New labs / imaging reviewed - none pending]  Assessment: Patient medically stable, awaiting [SNF / rehab / placement].  Ongoing medical issues: / hypertension/feeding  Plan: - Continue current medications. - Adjust plan as follows: [increase acetaminophen   / monitor BP / continue antibiotics]. - Encourage oral intake and mobility as tolerated. - Maintain communication with case management regarding placement. - Will reassess PRN for any acute change.  Attending Role / Disposition: Patient remains in ED under emergency physician care pending transfer to post-acute facility. Ongoing active management provided as needed. TOC involved       Moats Rolland BRAVO, MD 05/27/24 2053

## 2024-05-27 NOTE — ED Notes (Signed)
 Pt bed alarm heard, pt sitting on side of bed, states just used restroom. Pt asking for sprite, sprite provided.

## 2024-05-27 NOTE — ED Notes (Signed)
 This tech assisted pt to toilet in room. Pt able to ambulate with one assist. Pt is back in bed and given a ginger ale.

## 2024-05-27 NOTE — ED Provider Notes (Signed)
 Emergency Medicine Observation Re-evaluation Note   BP 125/67 (BP Location: Right Arm)   Pulse 90   Temp 98.2 F (36.8 C) (Oral)   Resp 16   Ht 5' 3 (1.6 m)   Wt 63.5 kg   SpO2 100%   BMI 24.80 kg/m    ED Course / MDM   No reported events during my shift at the time of this note.   Pt is awaiting dispo from consultants   Ginnie Shams MD    Shams Ginnie, MD 05/27/24 [phone]

## 2024-05-28 DIAGNOSIS — I120 Hypertensive chronic kidney disease with stage 5 chronic kidney disease or end stage renal disease: Secondary | ICD-10-CM | POA: Diagnosis not present

## 2024-05-28 LAB — CBC
HCT: 31 % — ABNORMAL LOW (ref 36.0–46.0)
Hemoglobin: 9.8 g/dL — ABNORMAL LOW (ref 12.0–15.0)
MCH: 29.9 pg (ref 26.0–34.0)
MCHC: 31.6 g/dL (ref 30.0–36.0)
MCV: 94.5 fL (ref 80.0–100.0)
Platelets: 351 K/uL (ref 150–400)
RBC: 3.28 MIL/uL — ABNORMAL LOW (ref 3.87–5.11)
RDW: 16.8 % — ABNORMAL HIGH (ref 11.5–15.5)
WBC: 10.1 K/uL (ref 4.0–10.5)
nRBC: 0.3 % — ABNORMAL HIGH (ref 0.0–0.2)

## 2024-05-28 LAB — RENAL FUNCTION PANEL
Albumin: 3.1 g/dL — ABNORMAL LOW (ref 3.5–5.0)
Anion gap: 13 (ref 5–15)
BUN: 68 mg/dL — ABNORMAL HIGH (ref 8–23)
CO2: 22 mmol/L (ref 22–32)
Calcium: 8.9 mg/dL (ref 8.9–10.3)
Chloride: 103 mmol/L (ref 98–111)
Creatinine, Ser: 4.82 mg/dL — ABNORMAL HIGH (ref 0.44–1.00)
GFR, Estimated: 10 mL/min — ABNORMAL LOW (ref >60)
Glucose, Bld: 127 mg/dL — ABNORMAL HIGH (ref 70–99)
Phosphorus: 4.9 mg/dL — ABNORMAL HIGH (ref 2.5–4.6)
Potassium: 3.6 mmol/L (ref 3.5–5.1)
Sodium: 138 mmol/L (ref 135–145)

## 2024-05-28 LAB — HEPATITIS B SURFACE ANTIGEN: Hepatitis B Surface Ag: NONREACTIVE

## 2024-05-28 MED ORDER — HEPARIN SODIUM (PORCINE) 1000 UNIT/ML DIALYSIS: 1000.0000 [IU] | INTRAMUSCULAR | Status: DC | PRN

## 2024-05-28 MED ORDER — PENTAFLUOROPROP-TETRAFLUOROETH EX AERO: 1.0000 | INHALATION_SPRAY | CUTANEOUS | Status: DC | PRN

## 2024-05-28 MED ORDER — CHLORHEXIDINE GLUCONATE CLOTH 2 % EX PADS: 6.0000 | MEDICATED_PAD | Freq: Every day | CUTANEOUS | Status: DC

## 2024-05-28 MED ORDER — LIDOCAINE-PRILOCAINE 2.5-2.5 % EX CREA: 1.0000 | TOPICAL_CREAM | CUTANEOUS | Status: DC | PRN

## 2024-05-28 NOTE — Progress Notes (Signed)
 [facility]  ROUNDING NOTE   Subjective:   Alicia Rogers is a 63 y.o. female with past medical history of anemia, depression, diabetes, hypertension, paranoid schizophrenia and end stage renal disease on hemodialysis. Patient was brought to emergency department for not feeling well. She was recently released from [facility]. Concerns for safe discharge.   Patient seen and evaluated during dialysis   HEMODIALYSIS FLOWSHEET:  Blood Flow Rate (mL/min): 400 mL/min Arterial Pressure (mmHg): -239.18 mmHg Venous Pressure (mmHg): 222.61 mmHg TMP (mmHg): -2.22 mmHg Ultrafiltration Rate (mL/min): 600 mL/min Dialysate Flow Rate (mL/min): 299 ml/min  Denies pain or shortness of breath. Room air. No lower extremity edema  Labs unremarkable for renal patient.   We have been consulted to manage dialysis needs.    Objective:  Vital signs in last 24 hours:  Temp:  [97.7 F (36.5 C)-97.9 F (36.6 C)] 97.9 F (36.6 C) (10/27 1309) Pulse Rate:  [73-81] 73 (10/27 1309) Resp:  [15-18] 15 (10/27 1309) BP: (105-145)/(48-69) 117/58 (10/27 1309) SpO2:  [93 %-100 %] 97 % (10/27 1309) Weight:  [50.5 kg] 50.5 kg (10/27 1309)  Weight change:  Filed Weights   05/25/24 1836 05/28/24 1309  Weight: 63.5 kg 50.5 kg    Intake/Output: No intake/output data recorded.   Intake/Output this shift:  No intake/output data recorded.  Physical Exam: General: NAD  Head: Normocephalic, atraumatic. Moist oral mucosal membranes  Eyes: Anicteric  Lungs:  Clear to auscultation, normal effort, room air  Heart: Regular rate and rhythm  Abdomen:  Soft, nontender  Extremities:  No peripheral edema.  Neurologic: Awake, alert, conversant  Skin: Warm,dry, no rash  Access: Lt AVF    Basic Metabolic Panel: Recent Labs  Lab 05/25/24 1924 05/28/24 1330  NA 142 138  K 3.5 3.6  CL 105 103  CO2 23 22  GLUCOSE 122* 127*  BUN 43* 68*  CREATININE 3.38* 4.82*  CALCIUM  9.9 8.9  PHOS  --  4.9*     Liver Function Tests: Recent Labs  Lab 05/25/24 1924 05/28/24 1330  AST 20  --   ALT 17  --   ALKPHOS 91  --   BILITOT 0.5  --   PROT 8.1  --   ALBUMIN 3.5 3.1*   No results for input(s): LIPASE, AMYLASE in the last 168 hours. No results for input(s): AMMONIA in the last 168 hours.  CBC: Recent Labs  Lab 05/25/24 1924 05/28/24 1330  WBC 13.2* 10.1  NEUTROABS 9.5*  --   HGB 9.9* 9.8*  HCT 33.5* 31.0*  MCV 97.4 94.5  PLT 362 351    Cardiac Enzymes: No results for input(s): CKTOTAL, CKMB, CKMBINDEX, TROPONINI in the last 168 hours.  BNP: Invalid input(s): POCBNP  CBG: No results for input(s): GLUCAP in the last 168 hours.  Microbiology: Results for orders placed or performed during the hospital encounter of 03/14/24  Urine Culture     Status: Abnormal   Collection Time: 03/14/24  8:36 PM   Specimen: Urine, Clean Catch  Result Value Ref Range Status   Specimen Description   Final    URINE, CLEAN CATCH Performed at [facility], [address] [place], [place] [address]    Special Requests   Final    NONE Performed at [facility], [address] [place], [place] [address]    Culture MULTIPLE SPECIES PRESENT, SUGGEST RECOLLECTION (A)  Final   Report Status 03/16/2024 FINAL  Final    Coagulation Studies: No results for  input(s): LABPROT, INR in the last 72 hours.  Urinalysis: No results for input(s): COLORURINE, LABSPEC, PHURINE, GLUCOSEU, HGBUR, BILIRUBINUR, KETONESUR, PROTEINUR, UROBILINOGEN, NITRITE, LEUKOCYTESUR in the last 72 hours.  Invalid input(s): APPERANCEUR    Imaging: No results found.   Medications:     allopurinol   100 mg Oral Daily   amLODipine   10 mg Oral Daily   ascorbic acid   500 mg Oral Daily   aspirin  EC  81 mg Oral Daily   budesonide -glycopyrrolate-formoterol   2 puff Inhalation BID   buPROPion   100 mg Oral Daily   [START ON 05/29/2024] Chlorhexidine   Gluconate Cloth  6 each Topical Q0600   cloNIDine   0.1 mg Oral TID   dapagliflozin  propanediol  5 mg Oral Daily   escitalopram   20 mg Oral Daily   feeding supplement  237 mL Oral BID BM   ferrous sulfate   325 mg Oral QODAY   furosemide   40 mg Oral BID   gabapentin   100 mg Oral TID   hydrALAZINE   25 mg Oral TID   isosorbide  mononitrate  60 mg Oral QHS   linagliptin   5 mg Oral Daily   loratadine  10 mg Oral Daily   losartan   100 mg Oral Daily   metolazone  5 mg Oral Daily   multivitamin with minerals  1 tablet Oral Daily   pantoprazole   40 mg Oral Daily   senna-docusate  1 tablet Oral Daily   [START ON 06/01/2024] Vitamin D  (Ergocalciferol )  50,000 Units Oral Q Fri   cyclobenzaprine , heparin , HYDROcodone -acetaminophen , lidocaine -prilocaine, lip balm, melatonin, pentafluoroprop-tetrafluoroeth, polyethylene glycol  Assessment/ Plan:  [name] is a 63 y.o.  female with past medical history of anemia, depression, diabetes, hypertension, paranoid schizophrenia and end stage renal disease on hemodialysis. Patient was brought to emergency department for not feeling well. She was recently released from [facility]. Concerns for safe discharge.   [facility]-S/Lt AVF  Hypertension with chronic kidney disease. Home regimen includes amlodipine , clonidine , furosemide , hydralazine , isosorbide , losartan , and metolazone. All have been resumed.   2. End stage renal disease on hemodialysis. Receives dialysis twice a week, Tuesday and Saturday. Will perform dialysis on Monday and Friday during this admission.   3. Anemia of chronic kidney disease Lab Results  Component Value Date   HGB 9.8 (L) 05/28/2024    Hgb stable, can consider low dose Retacrit  with next treatment.   4. Diabetes mellitus type II with chronic kidney disease/renal manifestations: noninsulin dependent. Home regimen includes Farxiga  and trajenta. Most recent hemoglobin A1c is 7.6 on 05/08/23.     5.  Secondary Hyperparathyroidism: with outpatient labs: PTH 308, phosphorus 4.8, calcium  9.4 on 05/19/24.   Lab Results  Component Value Date   CALCIUM  8.9 05/28/2024   CAION 1.29 07/14/2023   PHOS 4.9 (H) 05/28/2024    Will continue to monitor bone minerals.    LOS: 0 Kayliegh Boyers 10/27/20252:26 PM

## 2024-05-28 NOTE — ED Notes (Signed)
 Paper copy of dialysis consent signed by patient at this time and witnessed by this RN. PT taken to dialysis.

## 2024-05-28 NOTE — ED Notes (Signed)
 Pt back in the ED from dialysis at this time. Dietary called for meal tray at this time.

## 2024-05-28 NOTE — Progress Notes (Signed)
 Pt receives outpt HD at [facility] on T,S at 11:15am. Navigator following to assist with any HD needs.  Suzen Satchel Dialysis Navigator [phone]

## 2024-05-28 NOTE — Progress Notes (Signed)
 Hemodialysis Note:  Received patient in bed to unit. Alert and oriented. Informed consent singed and in chart.  Treatment initiated: 1330 Treatment completed: 1730  Access used: Left AVF Access issues: None  Patient tolerated well. Transported back to room, alert without acute distress. Report given to patient's RN.  Total UF removed: 1 liter Medications given: Norco Tablet  Post HD weight: 49.5 Kg  [facility]

## 2024-05-28 NOTE — ED Provider Notes (Signed)
 Emergency Medicine Observation Re-evaluation Note  Gregg SHAYONA HIBBITTS is a 63 y.o. female, seen on rounds today.  Pt initially presented to the ED for complaints of Hypertension Currently, the patient is resting.  Physical Exam  BP (!) 145/69   Pulse 81   Temp 97.8 F (36.6 C) (Oral)   Resp 18   Ht 5' 3 (1.6 m)   Wt 63.5 kg   SpO2 100%   BMI 24.80 kg/m  Physical Exam .Gen:  No acute distress Resp:  Breathing easily and comfortably, no accessory muscle usage Neuro:  Moving all four extremities, no gross focal neuro deficits Psych:  Resting currently, calm when awake   ED Course / MDM    I have reviewed the labs performed to date as well as medications administered while in observation.  Recent changes in the last 24 hours include no acute events.  Plan  Current plan is for pending social work dispo, does dialyze on Tuesdays and Saturdays, did not receive dialysis on Saturday, discussed with nephrology so that she can be put on the list for dialysis.    Waymond Lorelle Cummins, MD 05/28/24 [phone]

## 2024-05-28 NOTE — TOC Progression Note (Addendum)
 Transition of Care [facility]) - Progression Note    Patient Details  Name: Alicia Rogers MRN: [id number] Date of Birth: [birth year]  Transition [facility] CM/SW Contact  Delphine KANDICE Bring, RN Phone Number: 05/28/2024, 4:40 PM  Clinical Narrative:    CM and Sw went to talk with patient this afternoon. Cm explained to patient that she  is not admitted to the hospital. CM asked if she had her cell phone or know any phone numbers. Patient states that they would not let me get my phone.  Patient was able to give CM her sister Eric number [phone]. Patient states that Meade Slade is her mother who is deceased.Cm asked how much is her SSID check. Patient states [phone]. CM asked if she was paying Tonya rent. CM did not receive a clear answer. CM asked what is the address.Patient states that Tonya's address is [address] in [place]. CM asked where was she living prior to [facility]. Patient states at [facility] home where she had met Tonya.    CM called sister, Eric. Sister states that patient was staying with Tonya until her other sister [name] [phone] was able to find another place. She is aware that patient is in the ED. CM explained that she is not admitted to the hospital and is medically ready to d/c. CM asked if she had Tonya's phone number and if she had any concerns about her sister staying with Tonya. Sister states that Tonya's number is [phone].  Sister states that she does not know Tonya. Sister states that JJ had found another place.   CM called MARIJEAN [name] is aware that patient is in the ED. CM informed her that patient is medically ready for d/c. CM asked about the other facility she had found for patient. She states that she is waiting  to hear from them.  JJ states that her nephew can pick up patient. CM notified provider to call sister.  1615: Cm received a call from sister JJ. Cm informed her that patient is  currently in HD but she could be finishing soon. CM  informed her that patient does not have her walker. Sister states that her nephew will go by the home and pick it up. Sister is ok with patient going back to [facility] home at [address] in [place]. [place] informed JJ that [facility] was ordered she is in agreement with HH. CM sent message to provider and asked to add SW to [facility] order.                    Expected Discharge Plan and Services                                               Social Drivers of Health (SDOH) Interventions SDOH Screenings   Food Insecurity: No Food Insecurity (08/26/2023)  Housing: Low Risk  (08/26/2023)  Transportation Needs: No Transportation Needs (08/26/2023)  Utilities: Not At Risk (08/26/2023)  Alcohol Screen: Low Risk  (09/29/2021)  Depression (PHQ2-9): Low Risk  (01/12/2024)  Financial Resource Strain: Medium Risk (03/04/2023)   Received from [facility]  Social Connections: Unknown (09/28/2022)   Received from [facility]  Tobacco Use: High Risk (05/25/2024)    Readmission Risk Interventions    05/10/2023   10:05 AM  Readmission Risk Prevention Plan  Transportation Screening   HRI or Home Care Consult   Palliative Care Screening   Medication Review (RN Care Manager)      Information is confidential and restricted. Go to Review Flowsheets to unlock data.

## 2024-05-28 NOTE — ED Provider Notes (Signed)
-----------------------------------------   5:21 PM on 05/28/2024 ----------------------------------------- Patient cleared by social work for discharge home, will be staying with sister and nephew coming to the ED to pick her up.  She completed dialysis today without issue.   Willo Dunnings, MD 05/28/24 [phone]

## 2024-05-29 NOTE — TOC Progression Note (Signed)
 Transition of Care [facility]) - Progression Note    Patient Details  Name: Alicia Rogers MRN: [id number] Date of Birth: 30-Jan-1961  Transition of Care [facility]) CM/SW Contact  Delphine KANDICE Bring, RN Phone Number: 05/29/2024, 11:00 AM  Clinical Narrative:    CM followed up on [facility] referrals which were sent out on Sunday. CM spoke with Channing at [facility] to review referral. She has accepted patient in the HUB>                      Expected Discharge Plan and Services                                               Social Drivers of Health (SDOH) Interventions SDOH Screenings   Food Insecurity: No Food Insecurity (08/26/2023)  Housing: Low Risk  (08/26/2023)  Transportation Needs: No Transportation Needs (08/26/2023)  Utilities: Not At Risk (08/26/2023)  Alcohol Screen: Low Risk  (09/29/2021)  Depression (PHQ2-9): Low Risk  (01/12/2024)  Financial Resource Strain: Medium Risk (03/04/2023)   Received [facility]  Social Connections: Unknown (09/28/2022)   Received from [facility]  Tobacco Use: High Risk (05/25/2024)    Readmission Risk Interventions    05/10/2023   10:05 AM  Readmission Risk Prevention Plan  Transportation Screening   HRI or Home Care Consult   Palliative Care Screening   Medication Review (RN Care Manager)      Information is confidential and restricted. Go to Review Flowsheets to unlock data.

## 2024-06-18 ENCOUNTER — Encounter: Payer: Self-pay | Admitting: Internal Medicine

## 2024-07-17 ENCOUNTER — Encounter: Payer: Self-pay | Admitting: Internal Medicine

## 2024-07-29 ENCOUNTER — Other Ambulatory Visit: Payer: Self-pay

## 2024-07-29 ENCOUNTER — Emergency Department: Admission: EM | Admit: 2024-07-29 | Discharge: 2024-07-30 | Disposition: A

## 2024-07-29 ENCOUNTER — Encounter: Payer: Self-pay | Admitting: Internal Medicine

## 2024-07-29 DIAGNOSIS — K769 Liver disease, unspecified: Secondary | ICD-10-CM | POA: Insufficient documentation

## 2024-07-29 DIAGNOSIS — Z992 Dependence on renal dialysis: Secondary | ICD-10-CM | POA: Insufficient documentation

## 2024-07-29 DIAGNOSIS — E1122 Type 2 diabetes mellitus with diabetic chronic kidney disease: Secondary | ICD-10-CM | POA: Insufficient documentation

## 2024-07-29 DIAGNOSIS — I700 Atherosclerosis of aorta: Secondary | ICD-10-CM | POA: Insufficient documentation

## 2024-07-29 DIAGNOSIS — R059 Cough, unspecified: Secondary | ICD-10-CM | POA: Insufficient documentation

## 2024-07-29 DIAGNOSIS — R9431 Abnormal electrocardiogram [ECG] [EKG]: Secondary | ICD-10-CM | POA: Insufficient documentation

## 2024-07-29 DIAGNOSIS — J90 Pleural effusion, not elsewhere classified: Secondary | ICD-10-CM | POA: Insufficient documentation

## 2024-07-29 DIAGNOSIS — N186 End stage renal disease: Secondary | ICD-10-CM | POA: Diagnosis not present

## 2024-07-29 DIAGNOSIS — R1013 Epigastric pain: Secondary | ICD-10-CM | POA: Insufficient documentation

## 2024-07-29 DIAGNOSIS — I120 Hypertensive chronic kidney disease with stage 5 chronic kidney disease or end stage renal disease: Secondary | ICD-10-CM | POA: Insufficient documentation

## 2024-07-29 DIAGNOSIS — R109 Unspecified abdominal pain: Secondary | ICD-10-CM

## 2024-07-29 DIAGNOSIS — J449 Chronic obstructive pulmonary disease, unspecified: Secondary | ICD-10-CM | POA: Diagnosis not present

## 2024-07-29 NOTE — ED Notes (Signed)
 Family at bedside side advised pt was living at [facility] and was kicked out. She advised she is now living with her and another person back and forth. She took her Saturday for dialysis (yesterday) and staff at dialysis said she hasn't been since 12/8. Family also advised pt hasn't had her schiz meds in a month or so either and they was working with APS to find placement for the patient. Pt is alert and oriented and complaining of belly pain/

## 2024-07-29 NOTE — ED Triage Notes (Signed)
 Per sister pt has not had dialysis since 12/09, pt normally goes tues thurs Saturday. Pt reports abd pain and cough congestion.

## 2024-07-29 NOTE — ED Provider Notes (Signed)
 "  [facility] Provider Note    Event Date/Time   First MD Initiated Contact with Patient 07/29/24 2349     (approximate)   History   Abdominal Pain and Missed Dialysis  Per sister pt has not had dialysis since 12/09, pt normally goes tues thurs Saturday. Pt reports abd pain and cough congestion.    HPI Alicia Rogers is a 63 y.o. female PMH multiple medical comorbidities including ESRD on dialysis, MGUS, paranoid schizophrenia, COPD, T2DM, hyperlipidemia, hypertension presents for evaluation of cough, congestion, abdominal pain - Is overall limited historian.  Brought in by her sister who provides collateral.  States patient was previously at [facility] but was asked to leave, since then has been staying with her sister.  They are working on getting POA status.  Note the patient not been taking any of her medications since leaving [facility] and apparently has not been going to dialysis.  They have taken her to dialysis but the patient apparently took an [facility] and left.  They tried to go to dialysis yesterday (normal schedule is Tuesday and Saturday) and that is when patient's sister learned that patient had not actually had dialysis since 12/9.  They were told she had to get blood work     Physical Exam   Triage Vital Signs: ED Triage Vitals  Encounter Vitals Group     BP 07/29/24 2336 (!) 211/79     Girls Systolic BP Percentile --      Girls Diastolic BP Percentile --      Boys Systolic BP Percentile --      Boys Diastolic BP Percentile --      Pulse Rate 07/29/24 2336 97     Resp 07/29/24 2336 20     Temp 07/29/24 2336 99 F (37.2 C)     Temp src --      SpO2 07/29/24 2336 98 %     Weight 07/29/24 2334 125 lb (56.7 kg)     Height 07/29/24 2334 5' 3 (1.6 m)     Head Circumference --      [facility] Flow --      Pain Score 07/29/24 2334 8     Pain Loc --      Pain Education --      Exclude from Growth Chart --     Most recent vital  signs: Vitals:   07/30/24 0315 07/30/24 0555  BP:  (!) 170/82  Pulse: 78 78  Resp: 17 17  Temp:  98 F (36.7 C)  SpO2: 100% 100%     General: Awake, no distress.  CV:  Good peripheral perfusion. RRR, RP 2+. AVF LUE, +thrill.  Resp:  Normal effort. CTAB Abd:  No distention.  Mild tenderness to palpation in epigastrium. Neuro:  Alert, oriented, face symmetric, responding to questions and following basic commands   ED Results / Procedures / Treatments   Labs (all labs ordered are listed, but only abnormal results are displayed) Labs Reviewed  LIPASE, BLOOD - Abnormal; Notable for the following components:      Result Value   Lipase 81 (*)    All other components within normal limits  COMPREHENSIVE METABOLIC PANEL WITH GFR - Abnormal; Notable for the following components:   CO2 17 (*)    Glucose, Bld 216 (*)    BUN 45 (*)    Creatinine, Ser 3.12 (*)    Albumin 3.0 (*)    Alkaline Phosphatase 130 (*)  GFR, Estimated 16 (*)    Anion gap 16 (*)    All other components within normal limits  CBC - Abnormal; Notable for the following components:   Hemoglobin 11.5 (*)    MCHC 29.3 (*)    RDW 16.5 (*)    Platelets 414 (*)    All other components within normal limits  URINALYSIS, ROUTINE W REFLEX MICROSCOPIC - Abnormal; Notable for the following components:   Color, Urine YELLOW (*)    APPearance CLEAR (*)    Glucose, UA 150 (*)    Hgb urine dipstick SMALL (*)    Protein, ur >=300 (*)    All other components within normal limits  PRO BRAIN NATRIURETIC PEPTIDE - Abnormal; Notable for the following components:   Pro Brain Natriuretic Peptide 8,261.0 (*)    All other components within normal limits  TROPONIN T, HIGH SENSITIVITY - Abnormal; Notable for the following components:   Troponin T High Sensitivity 65 (*)    All other components within normal limits  TROPONIN T, HIGH SENSITIVITY - Abnormal; Notable for the following components:   Troponin T High Sensitivity 73 (*)     All other components within normal limits  RESP PANEL BY RT-PCR (RSV, FLU A&B, COVID)  RVPGX2  PROTIME-INR     EKG  Ecg = sinus rhythm, rate 93, no gross ST elevation or depression, no significant repolarization abnormality, normal axis, normal intervals.  No evidence of ischemia no arrhythmia on my interpretation.  No findings of hyperkalemia appreciated.   RADIOLOGY Radiology interpreted by myself and radiology reports reviewed.  No acute pathology identified.    PROCEDURES:  Critical Care performed: No  Procedures   MEDICATIONS ORDERED IN ED: Medications - No data to display   IMPRESSION / MDM / ASSESSMENT AND PLAN / ED COURSE  I reviewed the triage vital signs and the nursing notes.                              DDX/MDM/AP: Differential diagnosis includes, but is not limited to, electrolyte abnormality given multiple sessions of missed dialysis, consider intra-abdominal pathology including cholecystitis, choledocholithiasis, pancreatitis, PUD, dyspepsia, influenza.  Consider underlying pneumonia, UTI.  Plan: - Labs - EKG - Chest x-ray --notable for possible pneumonia as well as pleural effusions, escalated to CT chest abdomen pelvis - N.p.o. -  reassess  Patient's presentation is most consistent with acute presentation with potential threat to life or bodily function.  The patient is on the cardiac monitor to evaluate for evidence of arrhythmia and/or significant heart rate changes.  ED course below.  Laboratory workup overall unremarkable, electrolytes normal.  CT abdomen pelvis no acute pathology.  Urinalysis with no underlying infection.  Repeat exam with no ongoing abdominal tenderness, appears to be mentating at baseline per family.  Stable for outpatient dialysis from my perspective--they will call to arrange this, Default is tomorrow which would be reasonable.  Plan for PMD follow-up in addition.  ED return precautions in place.  Patient is  agreeable.  Clinical Course as of 07/30/24 0735  Mon Jul 30, 2024  0039 CXR: 1. Asymmetric increased opacity in the left base, possibly representing asymmetric compressive atelectasis or pneumonia. 2. Small pleural effusions, new in the interval. 3. . Cardiomegaly without findings of chf.   [MM]  0059 CMP reviewed, overall in baseline range.  Potassium 3.6.  Mild uremia though similar to baseline.  Somewhat low bicarb.  Lipase very mildly  elevated, nonspecific. [MM]  0100 CBC reviewed, unremarkable [MM]  0100 Troponin mildly elevated, suspect in the setting of ESRD and poor clearance, will trend [MM]  0234 CTCAP: IMPRESSION: 1. No acute findings in the chest. COPD without evidence of consolidation or effusion. 2. 2.9 x 2.7 cm ill-defined low-density lesion in segment 4a of the liver, present on 03/11/2024 but not conclusively present on the earlier studies, with MRI recommended for further evaluation. 3. 1.7 cm low-density solid nodule of the right lobe of the thyroid , with nonemergent ultrasound follow-up recommended for further study. 4. Pulmonary emphysema is an independent risk factor for lung cancer, and consideration is recommended for evaluation for a low-dose CT lung cancer screening program. 5. Perinephric stranding, unchanged. 6. Aortic atherosclerosis, most heavy in the abdominal aorta and iliac branches. 7. Chronic findings as above.   [MM]  0411 Rpt trop with interval change <10, stable [MM]  0415 UA w/ no e/o infxn [MM]  0515 Reevaluated, resting comfortably in bed.  No tenderness to deep palpation throughout abdomen.  Remains hemodynamically stable, satting well on room air.  No evidence of acute pathology at this time, I do believe she stable for outpatient dialysis.  Will discuss with her sister regarding discharge home. [MM]    Clinical Course User Index [MM] Clarine Ozell LABOR, MD     FINAL CLINICAL IMPRESSION(S) / ED DIAGNOSES   Final diagnoses:   Abdominal pain, unspecified abdominal location     Rx / DC Orders   ED Discharge Orders     None        Note:  This document was prepared using Dragon voice recognition software and may include unintentional dictation errors.   Clarine Ozell LABOR, MD 07/30/24 [phone]  "

## 2024-07-30 ENCOUNTER — Emergency Department

## 2024-07-30 LAB — CBC
HCT: 39.3 % (ref 36.0–46.0)
Hemoglobin: 11.5 g/dL — ABNORMAL LOW (ref 12.0–15.0)
MCH: 27.7 pg (ref 26.0–34.0)
MCHC: 29.3 g/dL — ABNORMAL LOW (ref 30.0–36.0)
MCV: 94.7 fL (ref 80.0–100.0)
Platelets: 414 K/uL — ABNORMAL HIGH (ref 150–400)
RBC: 4.15 MIL/uL (ref 3.87–5.11)
RDW: 16.5 % — ABNORMAL HIGH (ref 11.5–15.5)
WBC: 10.5 K/uL (ref 4.0–10.5)
nRBC: 0 % (ref 0.0–0.2)

## 2024-07-30 LAB — COMPREHENSIVE METABOLIC PANEL WITH GFR
ALT: 8 U/L (ref 0–44)
AST: 18 U/L (ref 15–41)
Albumin: 3 g/dL — ABNORMAL LOW (ref 3.5–5.0)
Alkaline Phosphatase: 130 U/L — ABNORMAL HIGH (ref 38–126)
Anion gap: 16 — ABNORMAL HIGH (ref 5–15)
BUN: 45 mg/dL — ABNORMAL HIGH (ref 8–23)
CO2: 17 mmol/L — ABNORMAL LOW (ref 22–32)
Calcium: 9 mg/dL (ref 8.9–10.3)
Chloride: 108 mmol/L (ref 98–111)
Creatinine, Ser: 3.12 mg/dL — ABNORMAL HIGH (ref 0.44–1.00)
GFR, Estimated: 16 mL/min — ABNORMAL LOW
Glucose, Bld: 216 mg/dL — ABNORMAL HIGH (ref 70–99)
Potassium: 3.6 mmol/L (ref 3.5–5.1)
Sodium: 141 mmol/L (ref 135–145)
Total Bilirubin: <0.2 mg/dL (ref 0.0–1.2)
Total Protein: 6.8 g/dL (ref 6.5–8.1)

## 2024-07-30 LAB — URINALYSIS, ROUTINE W REFLEX MICROSCOPIC
Bacteria, UA: NONE SEEN
Bilirubin Urine: NEGATIVE
Glucose, UA: 150 mg/dL — AB
Ketones, ur: NEGATIVE mg/dL
Leukocytes,Ua: NEGATIVE
Nitrite: NEGATIVE
Protein, ur: >=300 mg/dL — AB
Specific Gravity, Urine: 1.017 (ref 1.005–1.030)
pH: 6 (ref 5.0–8.0)

## 2024-07-30 LAB — RESP PANEL BY RT-PCR (RSV, FLU A&B, COVID)  RVPGX2
Influenza A by PCR: NEGATIVE
Influenza B by PCR: NEGATIVE
Resp Syncytial Virus by PCR: NEGATIVE
SARS Coronavirus 2 by RT PCR: NEGATIVE

## 2024-07-30 LAB — PROTIME-INR
INR: 1 (ref 0.8–1.2)
Prothrombin Time: 13.5 s (ref 11.4–15.2)

## 2024-07-30 LAB — TROPONIN T, HIGH SENSITIVITY
Troponin T High Sensitivity: 65 ng/L — ABNORMAL HIGH (ref 0–19)
Troponin T High Sensitivity: 73 ng/L — ABNORMAL HIGH (ref 0–19)

## 2024-07-30 LAB — PRO BRAIN NATRIURETIC PEPTIDE: Pro Brain Natriuretic Peptide: 8261 pg/mL — ABNORMAL HIGH

## 2024-07-30 LAB — LIPASE, BLOOD: Lipase: 81 U/L — ABNORMAL HIGH (ref 11–51)

## 2024-07-30 NOTE — Discharge Instructions (Addendum)
 Your evaluation in the emergency department is overall reassuring.  We saw no indication for emergent dialysis at this time, but it is important that you follow-up for dialysis today or tomorrow--please call the facility today to arrange for this.  Please follow-up with your primary care provider and nephrologist for reevaluation, and return to the emergency department with any new or worsening symptoms.

## 2024-07-30 NOTE — ED Notes (Signed)
 Sister requesting updates on her when results come back

## 2024-08-27 NOTE — Telephone Encounter (Signed)
 Copied from CRM #[id number]. Topic: Scheduling - Cancel/Reschedule >> Aug 27, 2024  4:11 PM [name] wrote: Reschedule  Additional Requests/Needs: Yes Clinical Related: Medication Question(s): They are unsure of what medication(s) are safe to take over the counter due to their specific medical condition(s). They are requesting a medication for bowels and peeing please assist she couldn't remember the name  Please advise.  Pharmacy: [facility] - [place], [place] - [address]  Please contact The patient by Cell Phone Telephone Information: Mobile          [phone]  in regards to this request.  Coverage: yes, coverage is accurate on file.  Urgent turnaround time: within 24 business hours. (Caller Notified)    Urgent Reason: Completely out of medication(s)

## 2024-08-28 ENCOUNTER — Inpatient Hospital Stay: Admit: 2024-08-28 | Discharge: 2024-08-28 | Disposition: A | Attending: Osteopathic Medicine

## 2024-08-28 ENCOUNTER — Emergency Department

## 2024-08-28 ENCOUNTER — Inpatient Hospital Stay
Admission: EM | Admit: 2024-08-28 | Discharge: 2024-08-31 | DRG: 640 | Disposition: A | Discharge status: Home / Self Care | Attending: Obstetrics and Gynecology | Admitting: Obstetrics and Gynecology

## 2024-08-28 DIAGNOSIS — Z992 Dependence on renal dialysis: Secondary | ICD-10-CM

## 2024-08-28 DIAGNOSIS — E785 Hyperlipidemia, unspecified: Secondary | ICD-10-CM | POA: Diagnosis present

## 2024-08-28 DIAGNOSIS — N186 End stage renal disease: Secondary | ICD-10-CM | POA: Diagnosis present

## 2024-08-28 DIAGNOSIS — E119 Type 2 diabetes mellitus without complications: Secondary | ICD-10-CM

## 2024-08-28 DIAGNOSIS — J121 Respiratory syncytial virus pneumonia: Secondary | ICD-10-CM

## 2024-08-28 DIAGNOSIS — Z880 Allergy status to penicillin: Secondary | ICD-10-CM

## 2024-08-28 DIAGNOSIS — F061 Catatonic disorder due to known physiological condition: Secondary | ICD-10-CM | POA: Diagnosis present

## 2024-08-28 DIAGNOSIS — E1122 Type 2 diabetes mellitus with diabetic chronic kidney disease: Secondary | ICD-10-CM | POA: Diagnosis present

## 2024-08-28 DIAGNOSIS — D472 Monoclonal gammopathy: Secondary | ICD-10-CM | POA: Diagnosis present

## 2024-08-28 DIAGNOSIS — Z8249 Family history of ischemic heart disease and other diseases of the circulatory system: Secondary | ICD-10-CM

## 2024-08-28 DIAGNOSIS — M109 Gout, unspecified: Secondary | ICD-10-CM | POA: Diagnosis present

## 2024-08-28 DIAGNOSIS — Z818 Family history of other mental and behavioral disorders: Secondary | ICD-10-CM

## 2024-08-28 DIAGNOSIS — F200 Paranoid schizophrenia: Secondary | ICD-10-CM | POA: Diagnosis present

## 2024-08-28 DIAGNOSIS — D631 Anemia in chronic kidney disease: Secondary | ICD-10-CM | POA: Diagnosis present

## 2024-08-28 DIAGNOSIS — Z888 Allergy status to other drugs, medicaments and biological substances status: Secondary | ICD-10-CM

## 2024-08-28 DIAGNOSIS — Z833 Family history of diabetes mellitus: Secondary | ICD-10-CM

## 2024-08-28 DIAGNOSIS — F32A Depression, unspecified: Secondary | ICD-10-CM | POA: Diagnosis present

## 2024-08-28 DIAGNOSIS — J449 Chronic obstructive pulmonary disease, unspecified: Secondary | ICD-10-CM | POA: Diagnosis present

## 2024-08-28 DIAGNOSIS — E8770 Fluid overload, unspecified: Principal | ICD-10-CM | POA: Diagnosis present

## 2024-08-28 DIAGNOSIS — R601 Generalized edema: Secondary | ICD-10-CM | POA: Diagnosis not present

## 2024-08-28 DIAGNOSIS — Z515 Encounter for palliative care: Secondary | ICD-10-CM

## 2024-08-28 DIAGNOSIS — F250 Schizoaffective disorder, bipolar type: Secondary | ICD-10-CM | POA: Diagnosis present

## 2024-08-28 DIAGNOSIS — Z83511 Family history of glaucoma: Secondary | ICD-10-CM

## 2024-08-28 DIAGNOSIS — G9341 Metabolic encephalopathy: Secondary | ICD-10-CM | POA: Diagnosis present

## 2024-08-28 DIAGNOSIS — Z91158 Patient's noncompliance with renal dialysis for other reason: Secondary | ICD-10-CM

## 2024-08-28 DIAGNOSIS — K219 Gastro-esophageal reflux disease without esophagitis: Secondary | ICD-10-CM | POA: Diagnosis present

## 2024-08-28 DIAGNOSIS — Z91148 Patient's other noncompliance with medication regimen for other reason: Secondary | ICD-10-CM

## 2024-08-28 DIAGNOSIS — I2510 Atherosclerotic heart disease of native coronary artery without angina pectoris: Secondary | ICD-10-CM | POA: Diagnosis present

## 2024-08-28 DIAGNOSIS — J90 Pleural effusion, not elsewhere classified: Secondary | ICD-10-CM | POA: Diagnosis present

## 2024-08-28 DIAGNOSIS — I1311 Hypertensive heart and chronic kidney disease without heart failure, with stage 5 chronic kidney disease, or end stage renal disease: Secondary | ICD-10-CM | POA: Diagnosis present

## 2024-08-28 DIAGNOSIS — F22 Delusional disorders: Secondary | ICD-10-CM

## 2024-08-28 DIAGNOSIS — N2581 Secondary hyperparathyroidism of renal origin: Secondary | ICD-10-CM | POA: Diagnosis present

## 2024-08-28 DIAGNOSIS — I10 Essential (primary) hypertension: Secondary | ICD-10-CM | POA: Diagnosis present

## 2024-08-28 LAB — URINALYSIS, ROUTINE W REFLEX MICROSCOPIC
Bilirubin Urine: NEGATIVE
Glucose, UA: 150 mg/dL — AB
Ketones, ur: NEGATIVE mg/dL
Leukocytes,Ua: NEGATIVE
Nitrite: NEGATIVE
Protein, ur: >=300 mg/dL — AB
Specific Gravity, Urine: 1.024 (ref 1.005–1.030)
pH: 5 (ref 5.0–8.0)

## 2024-08-28 LAB — CBC WITH DIFFERENTIAL/PLATELET
Abs Immature Granulocytes: 0.02 10*3/uL (ref 0.00–0.07)
Basophils Absolute: 0 10*3/uL (ref 0.0–0.1)
Basophils Relative: 1 %
Eosinophils Absolute: 0 10*3/uL (ref 0.0–0.5)
Eosinophils Relative: 1 %
HCT: 40.5 % (ref 36.0–46.0)
Hemoglobin: 12 g/dL (ref 12.0–15.0)
Immature Granulocytes: 0 %
Lymphocytes Relative: 23 %
Lymphs Abs: 1.5 10*3/uL (ref 0.7–4.0)
MCH: 27.4 pg (ref 26.0–34.0)
MCHC: 29.6 g/dL — ABNORMAL LOW (ref 30.0–36.0)
MCV: 92.5 fL (ref 80.0–100.0)
Monocytes Absolute: 0.5 10*3/uL (ref 0.1–1.0)
Monocytes Relative: 8 %
Neutro Abs: 4.4 10*3/uL (ref 1.7–7.7)
Neutrophils Relative %: 67 %
Platelets: 382 10*3/uL (ref 150–400)
RBC: 4.38 MIL/uL (ref 3.87–5.11)
RDW: 15.9 % — ABNORMAL HIGH (ref 11.5–15.5)
WBC: 6.5 10*3/uL (ref 4.0–10.5)
nRBC: 0 % (ref 0.0–0.2)

## 2024-08-28 LAB — COMPREHENSIVE METABOLIC PANEL WITH GFR
ALT: 8 U/L (ref 0–44)
AST: 15 U/L (ref 15–41)
Albumin: 3.1 g/dL — ABNORMAL LOW (ref 3.5–5.0)
Alkaline Phosphatase: 156 U/L — ABNORMAL HIGH (ref 38–126)
Anion gap: 12 (ref 5–15)
BUN: 37 mg/dL — ABNORMAL HIGH (ref 8–23)
CO2: 21 mmol/L — ABNORMAL LOW (ref 22–32)
Calcium: 9.2 mg/dL (ref 8.9–10.3)
Chloride: 112 mmol/L — ABNORMAL HIGH (ref 98–111)
Creatinine, Ser: 3.19 mg/dL — ABNORMAL HIGH (ref 0.44–1.00)
GFR, Estimated: 16 mL/min — ABNORMAL LOW
Glucose, Bld: 149 mg/dL — ABNORMAL HIGH (ref 70–99)
Potassium: 3.3 mmol/L — ABNORMAL LOW (ref 3.5–5.1)
Sodium: 145 mmol/L (ref 135–145)
Total Bilirubin: 0.3 mg/dL (ref 0.0–1.2)
Total Protein: 6.8 g/dL (ref 6.5–8.1)

## 2024-08-28 LAB — URINE DRUG SCREEN
Amphetamines: NEGATIVE
Barbiturates: NEGATIVE
Benzodiazepines: NEGATIVE
Cocaine: NEGATIVE
Fentanyl: NEGATIVE
Methadone Scn, Ur: NEGATIVE
Opiates: NEGATIVE
Tetrahydrocannabinol: NEGATIVE

## 2024-08-28 LAB — CBG MONITORING, ED: Glucose-Capillary: 144 mg/dL — ABNORMAL HIGH (ref 70–99)

## 2024-08-28 LAB — TSH: TSH: 1.86 u[IU]/mL (ref 0.350–4.500)

## 2024-08-28 LAB — HEMOGLOBIN A1C
Hgb A1c MFr Bld: 7.2 % — ABNORMAL HIGH (ref 4.8–5.6)
Mean Plasma Glucose: 159.94 mg/dL

## 2024-08-28 LAB — LACTIC ACID, PLASMA: Lactic Acid, Venous: 1.1 mmol/L (ref 0.5–1.9)

## 2024-08-28 LAB — GLUCOSE, CAPILLARY: Glucose-Capillary: 189 mg/dL — ABNORMAL HIGH (ref 70–99)

## 2024-08-28 LAB — ETHANOL: Alcohol, Ethyl (B): <15 mg/dL

## 2024-08-28 LAB — PRO BRAIN NATRIURETIC PEPTIDE: Pro Brain Natriuretic Peptide: 9877 pg/mL — ABNORMAL HIGH

## 2024-08-28 MED ORDER — BUDESON-GLYCOPYRROL-FORMOTEROL 160-9-4.8 MCG/ACT IN AERO
2.0000 | INHALATION_SPRAY | Freq: Two times a day (BID) | RESPIRATORY_TRACT | Status: DC
  Administered 2024-08-30: 2 via RESPIRATORY_TRACT
  Filled 2024-08-28: qty 5.9

## 2024-08-28 MED ORDER — HEPARIN SODIUM (PORCINE) 5000 UNIT/ML IJ SOLN
5000.0000 [IU] | Freq: Three times a day (TID) | INTRAMUSCULAR | Status: DC
  Filled 2024-08-28 (×3): qty 1

## 2024-08-28 MED ORDER — MIDAZOLAM HCL 5 MG/5ML IJ SOLN
5.0000 mg | Freq: Once | INTRAMUSCULAR | Status: AC
  Administered 2024-08-28: 5 mg via INTRAMUSCULAR
  Filled 2024-08-28: qty 5

## 2024-08-28 MED ORDER — ISOSORBIDE MONONITRATE ER 30 MG PO TB24
30.0000 mg | ORAL_TABLET | Freq: Every day | ORAL | Status: DC
  Administered 2024-08-28: 30 mg via ORAL
  Filled 2024-08-28 (×3): qty 1

## 2024-08-28 MED ORDER — IPRATROPIUM-ALBUTEROL 0.5-2.5 (3) MG/3ML IN SOLN: 3.0000 mL | RESPIRATORY_TRACT | Status: DC | PRN

## 2024-08-28 MED ORDER — HYDRALAZINE HCL 20 MG/ML IJ SOLN: 10.0000 mg | Freq: Four times a day (QID) | INTRAMUSCULAR | Status: DC | PRN

## 2024-08-28 MED ORDER — FUROSEMIDE 10 MG/ML IJ SOLN
80.0000 mg | Freq: Once | INTRAMUSCULAR | Status: AC
  Administered 2024-08-28: 80 mg via INTRAVENOUS
  Filled 2024-08-28: qty 8

## 2024-08-28 MED ORDER — LOSARTAN POTASSIUM 50 MG PO TABS
50.0000 mg | ORAL_TABLET | Freq: Every day | ORAL | Status: DC
  Administered 2024-08-28 – 2024-08-31 (×4): 50 mg via ORAL
  Filled 2024-08-28 (×5): qty 1

## 2024-08-28 MED ORDER — PANTOPRAZOLE SODIUM 40 MG PO TBEC
40.0000 mg | DELAYED_RELEASE_TABLET | Freq: Every day | ORAL | Status: DC
  Administered 2024-08-28 – 2024-08-31 (×4): 40 mg via ORAL
  Filled 2024-08-28 (×5): qty 1

## 2024-08-28 MED ORDER — ATORVASTATIN CALCIUM 20 MG PO TABS
40.0000 mg | ORAL_TABLET | Freq: Every day | ORAL | Status: DC
  Administered 2024-08-28: 40 mg via ORAL
  Filled 2024-08-28 (×3): qty 2

## 2024-08-28 MED ORDER — AMLODIPINE BESYLATE 10 MG PO TABS
10.0000 mg | ORAL_TABLET | Freq: Every day | ORAL | Status: DC
  Administered 2024-08-28 – 2024-08-31 (×4): 10 mg via ORAL
  Filled 2024-08-28 (×5): qty 1

## 2024-08-28 MED ORDER — INSULIN ASPART 100 UNIT/ML IJ SOLN
0.0000 [IU] | Freq: Three times a day (TID) | INTRAMUSCULAR | Status: DC
  Filled 2024-08-28 (×2): qty 1

## 2024-08-28 MED ORDER — ASPIRIN 81 MG PO TBEC
81.0000 mg | DELAYED_RELEASE_TABLET | Freq: Every day | ORAL | Status: DC
  Administered 2024-08-28 – 2024-08-31 (×4): 81 mg via ORAL
  Filled 2024-08-28 (×5): qty 1

## 2024-08-28 MED ORDER — CLONIDINE HCL 0.1 MG PO TABS
0.1000 mg | ORAL_TABLET | Freq: Three times a day (TID) | ORAL | Status: DC
  Administered 2024-08-28 – 2024-08-31 (×6): 0.1 mg via ORAL
  Filled 2024-08-28 (×10): qty 1

## 2024-08-28 MED ORDER — MIDAZOLAM HCL (PF) 2 MG/2ML IJ SOLN: 2.0000 mg | INTRAMUSCULAR | Status: DC | PRN

## 2024-08-28 NOTE — Hospital Course (Addendum)
 Alicia Rogers is a 64 y.o. female w/ ESRD, DM2, HTN, HLD, GERD, schizoaffective disorder, shizophrenia, bipolar, catatonia. She is brought to the ED today from home by her sister w/ concern for SOB / medical noncompliance   HPI:   Spoke w/ patient at bedside in ED. She has gotten dose Versed  in the ED to allow staff to place an IV. She is conversational but speech is slow / slurred. She can tell me she is at the hospital for breathing problems. She states she doesn't want dialysis just dont want to despite education that refusing dialysis will result in eventual death.  Spoke w/ sister Alicia Rogers who gave some more information: She states things started this time around Oct 2025 - patient was kicked out of [facility]. She was living w/ her sister Alicia Rogers. [name] states that Alicia Rogers was given a list of her medications / prescriptions. Rx unable to be filled at first until patient went to primary care few weeks ago, they rx some meds for her to [facility]. [name] states that now Alicia Rogers wont take the medications. Temple is giving a lot of different reasons for not going to dialysis, ranging from thery're talking bad about me there to just not wanting to go. [name] also states that Alicia Rogers was getting a monthly injection for schizophrenia at [facility] - last time she got the shot was probably 05/2024. Alicia Rogers is working w/ DSS to arrange guardianship whether guardian is family or the state. Only other concern at this time is the shortness of breath worsening over past few days and also concern about feet and hands - nails are long, she has not been letting her sisters cut her nails. Alicia Rogers states that she has not been letting us  do much to help her and I had to raise my voice to even get her to come to the ER and I hate doing that but she won't listen. Last dialysis was 08/11/24.      Hospital course / significant events: 01/27: sister Alicia Rogers brought patient to the ED w/ above Hx. VSS, renal  function about at baseline ESRD and no severe electrolyte derangements. CXR cardiomegaly and small pleural effusions, EKG no concerning ST changes she has some T inversions that appear chronic. UA and UDS no concerns. CBC no concerns. Per EDP, pt was put under IVC. Received Versed  so staff could place IV. Patient is oriented to time/place and states her sister brought her to the ED because of breathing problems, she states she doesn't want dialysis just dont want to despite education that refusing dialysis will result in eventual death. Admitted to hospitalist. Consult to nephrology - Dr Marcelino will see patient tomorrow, no emergent need for dialysis. Dr Gonzella team will consult tomorrow - pt currently sedated and not medically stable but may be candidate for [facility] admission. Last echocardiogram ?1 year ago will repeat on this admission. Ordered Lasix  IV 80 mg x1 (pt does make some urine). IVC remains in place. Per sister, who is radiation protection practitioner at this time, ok to sedate or restrain as needed to override patient refusal of treatment, and/or as needed if patient is danger to self or others.      Consultants:  Behavioral health Nephrology   Procedures/Surgeries: none      ASSESSMENT & PLAN:   Encephalopathy - acute now w/ sedative effect, likely chronic component with uremia due to dialysis refusal  Monitor  Psychiatry consult pending   Medical noncompliance apparently due to psychiatric  illness  Lacks decision-making capacity at this time - Patient does not demonstrate understanding of her medical conditions at this time sufficient to make complex decision around informed consent / refusal  IVC in place Psychiatry consult pending  Per sister, Alicia Rogers, who is radiation protection practitioner at this time, ok to sedate or restrain as needed to override patient refusal of treatment, and/or as needed if patient is danger to self or others.  Also per sister, Alicia Rogers, DSS guardianship is in  process   History schizoaffective disorder, question schizophrenia. History bipolar disorder. History hospitalization for catatonia.  IVC in place Psychiatry consult pending  Holding previous psychiatric medications Versed  as needed for anxiety/agitation - 2 doses ordered    ESRD Fluid overload Refusing dialysis  Nephrology to follow, unable to non-emergently dialyze today will plan for tomorrow  May need to sedate patient to cooperate with necessary treatment, nephrology team is aware  Lasix  IV 80 mg x1 today Strict in/out  Echo pending   Cardiomegaly  Pleural effusion Edema  Dialysis as above  Lasix  IV 80 mg x1 today Strict in/out  Echo pending   Essential HTN  Resume amlodipine , clonidine , losartan  Consider also restart home hydralazine  po, metolazone ,  IV hydralazine  prn   CAD Resume aspirin , atorvastatin , losartan , isosorbide    COPD Resume Breztri  Duoneb as needed  Diabetes type 2 A1C pending SSI Per records she was previously on Farxiga , linagliptin ,    Gout Previously on allopurinol  will hold for now consider restart w/ colchicine on discharge   GERD PPI   N/a based on BMI: There is no height or weight on file to calculate BMI.. Significantly low or high BMI is associated with higher medical risk.  Underweight - under 18  overweight - 25 to 29 obese - 30 or more Class 1 obesity: BMI of 30.0 to 34 Class 2 obesity: BMI of 35.0 to 39 Class 3 obesity: BMI of 40.0 to 49 Super Morbid Obesity: BMI 50-59 Super-super Morbid Obesity: BMI 60+ Healthy nutrition and physical activity advised as adjunct to other disease management and risk reduction treatments    DVT prophylaxis: heparin  IV fluids: no continuous IV fluids  Nutrition: carb/cardiac diet  Central lines / other devices: none  Code Status: FULL CODE ACP documentation reviewed: none on file in [facility]  TOC needs: TBD Medical barriers to dispo at this time: see above. Once dialyzed / fluid  offloaded expect will be stable for dispo but behavioral/social issues as above may result in LLOS - pending psychiatry consult also

## 2024-08-28 NOTE — H&P (Signed)
 " HISTORY AND PHYSICAL    Alicia Rogers   FMW:[id number] DOB: 03-02-1961   Date of Service: 08/28/24 Requesting physician/APP from ED: Treatment Team:  Attending Provider: Cote Mayabb, DO  PCP: Austin Candyce Stall, MD   Alicia Rogers is a 64 y.o. female w/ ESRD, DM2, HTN, HLD, GERD, schizoaffective disorder, shizophrenia, bipolar, catatonia. She is brought to the ED today from home by her sister w/ concern for SOB / medical noncompliance   HPI:   Spoke w/ patient at bedside in ED. She has gotten dose Versed  in the ED to allow staff to place an IV. She is conversational but speech is slow / slurred. She can tell me she is at the hospital for breathing problems. She states she doesn't want dialysis just dont want to despite education that refusing dialysis will result in eventual death.  Spoke w/ sister Mathew Storck who gave some more information: She states things started this time around Oct 2025 - patient was kicked out of [facility]. She was living w/ her sister Alicia Rogers. Alicia Rogers states that Alicia Rogers was given a list of her medications / prescriptions. Rx unable to be filled at first until patient went to primary care few weeks ago, they rx some meds for her to [facility]. Alicia Rogers states that now Yadhira wont take the medications. Manar is giving a lot of different reasons for not going to dialysis, ranging from thery're talking bad about me there to just not wanting to go. Alicia Rogers also states that Fartun was getting a monthly injection for schizophrenia at [facility] - last time she got the shot was probably 05/2024. Alicia Rogers is working w/ DSS to arrange guardianship whether guardian is family or the state. Only other concern at this time is the shortness of breath worsening over past few days and also concern about feet and hands - nails are long, she has not been letting her sisters cut her nails. Alicia Rogers states that she has not been letting us  do much to help her and I had to raise my  voice to even get her to come to the ER and I hate doing that but she won't listen. Last dialysis was 08/11/24.      Hospital course / significant events: 01/27: sister Alicia Rogers brought patient to the ED w/ above Hx. VSS, renal function about at baseline ESRD and no severe electrolyte derangements. CXR cardiomegaly and small pleural effusions, EKG no concerning ST changes she has some T inversions that appear chronic. UA and UDS no concerns. CBC no concerns. Per EDP, pt was put under IVC. Received Versed  so staff could place IV. Patient is oriented to time/place and states her sister brought her to the ED because of breathing problems, she states she doesn't want dialysis just dont want to despite education that refusing dialysis will result in eventual death. Admitted to hospitalist. Consult to nephrology - Dr Marcelino will see patient tomorrow, no emergent need for dialysis. Dr Gonzella team will consult tomorrow - pt currently sedated and not medically stable but may be candidate for [facility] admission. Last echocardiogram ?1 year ago will repeat on this admission. Ordered Lasix  IV 80 mg x1 (pt does make some urine). IVC remains in place. Per sister, who is radiation protection practitioner at this time, ok to sedate or restrain as needed to override patient refusal of treatment, and/or as needed if patient is danger to self or others.      Consultants:  Behavioral health Nephrology   Procedures/Surgeries:  none      ASSESSMENT & PLAN:   Encephalopathy - acute now w/ sedative effect, likely chronic component with uremia due to dialysis refusal  Monitor  Psychiatry consult pending   Medical noncompliance apparently due to psychiatric illness  Lacks decision-making capacity at this time - Patient does not demonstrate understanding of her medical conditions at this time sufficient to make complex decision around informed consent / refusal  IVC in place Psychiatry consult pending  Per sister, Alicia Rogers,  who is acting decision-maker at this time, ok to sedate or restrain as needed to override patient refusal of treatment, and/or as needed if patient is danger to self or others.  Also per sister, Alicia Rogers, DSS guardianship is in process   History schizoaffective disorder, question schizophrenia. History bipolar disorder. History hospitalization for catatonia.  IVC in place Psychiatry consult pending  Holding previous psychiatric medications Versed  as needed for anxiety/agitation - 2 doses ordered    ESRD Fluid overload Refusing dialysis  Nephrology to follow, unable to non-emergently dialyze today will plan for tomorrow  May need to sedate patient to cooperate with necessary treatment, nephrology team is aware  Lasix  IV 80 mg x1 today Strict in/out  Echo pending   Cardiomegaly  Pleural effusion Edema  Dialysis as above  Lasix  IV 80 mg x1 today Strict in/out  Echo pending   Essential HTN  Resume amlodipine , clonidine , losartan  Consider also restart home hydralazine  po, metolazone ,  IV hydralazine  prn   CAD Resume aspirin , atorvastatin , losartan , isosorbide    COPD Resume Breztri  Duoneb as needed  Diabetes type 2 A1C pending SSI Per records she was previously on Farxiga , linagliptin ,    Gout Previously on allopurinol  will hold for now consider restart w/ colchicine on discharge   GERD PPI   N/a based on BMI: There is no height or weight on file to calculate BMI.. Significantly low or high BMI is associated with higher medical risk.  Underweight - under 18  overweight - 25 to 29 obese - 30 or more Class 1 obesity: BMI of 30.0 to 34 Class 2 obesity: BMI of 35.0 to 39 Class 3 obesity: BMI of 40.0 to 49 Super Morbid Obesity: BMI 50-59 Super-super Morbid Obesity: BMI 60+ Healthy nutrition and physical activity advised as adjunct to other disease management and risk reduction treatments    DVT prophylaxis: heparin  IV fluids: no continuous IV fluids  Nutrition:  carb/cardiac diet  Central lines / other devices: none  Code Status: FULL CODE ACP documentation reviewed: none on file in [facility]  TOC needs: TBD Medical barriers to dispo at this time: see above. Once dialyzed / fluid offloaded expect will be stable for dispo but behavioral/social issues as above may result in LLOS - pending psychiatry consult also                Review of Systems:  Review of Systems  Reason unable to perform ROS: limited due to patient condition.  Constitutional:  Negative for chills and fever.  Respiratory:  Positive for shortness of breath.   Cardiovascular:  Negative for chest pain.  Gastrointestinal:  Negative for nausea.       has a past medical history of Anemia, Aortic atherosclerosis, Bilateral adrenal adenomas, CKD (chronic kidney disease), stage V (HCC), Depression, Diabetes mellitus, type II (HCC), Diverticulosis, Hand swelling (09/27/2021), Hypertension, Paranoid schizophrenia (HCC), Proteinuria, and Schizophrenia (HCC).  Show/hide medication list[1]  Allergies[2] confirmed w/ sister   family history includes Depression in her mother and sister; Diabetes  in her father, mother, and sister; Glaucoma in her father and mother; Heart attack in her mother; Heart disease in her mother; Hypertension in her father and mother; Post-traumatic stress disorder in her sister; Prostate cancer in her father.   reports that she has been smoking cigarettes. She started smoking about 46 years ago. She has a 46.4 pack-year smoking history. She has never used smokeless tobacco. She reports that she does not currently use alcohol. She reports that she does not use drugs.   Past Surgical History:  Procedure Laterality Date   A/V FISTULAGRAM Left 10/26/2023   Procedure: A/V Fistulagram;  Surgeon: Marea Selinda RAMAN, MD;  Location: [facility] INVASIVE CV LAB;  Service: Cardiovascular;  Laterality: Left;   AV FISTULA PLACEMENT Left 07/14/2023   Procedure: ARTERIOVENOUS (AV)  FISTULA CREATION;  Surgeon: Marea Selinda RAMAN, MD;  Location: [facility] ORS;  Service: Vascular;  Laterality: Left;   COLONOSCOPY WITH PROPOFOL  N/A 07/21/2022   Procedure: COLONOSCOPY WITH PROPOFOL ;  Surgeon: Unk Corinn Skiff, MD;  Location: [facility] ENDOSCOPY;  Service: Gastroenterology;  Laterality: N/A;   colonscopy     ESOPHAGOGASTRODUODENOSCOPY (EGD) WITH PROPOFOL  N/A 07/21/2022   Procedure: ESOPHAGOGASTRODUODENOSCOPY (EGD) WITH PROPOFOL ;  Surgeon: Unk Corinn Skiff, MD;  Location: [facility] ENDOSCOPY;  Service: Gastroenterology;  Laterality: N/A;   LOWER EXTREMITY ANGIOGRAPHY Right 07/08/2023   Procedure: Lower Extremity Angiography;  Surgeon: Marea Selinda RAMAN, MD;  Location: [facility] INVASIVE CV LAB;  Service: Cardiovascular;  Laterality: Right;          Objective Findings:  Vitals:   08/28/24 1500 08/28/24 1630 08/28/24 1644 08/28/24 1645  BP: (!) 191/71 (!) 169/52  (!) 187/59  Pulse: 70  63 63  Resp: 16   20  Temp:      TempSrc:      SpO2: 98% 98% 99% 99%   No intake or output data in the 24 hours ending 08/28/24 1705 There were no vitals filed for this visit.  Examination:  Physical Exam Constitutional:      General: She is not in acute distress. Eyes:     Extraocular Movements: Extraocular movements intact.  Cardiovascular:     Rate and Rhythm: Normal rate and regular rhythm.  Pulmonary:     Effort: Pulmonary effort is normal.     Breath sounds: Rales present.  Abdominal:     General: Bowel sounds are normal. There is distension (mild).     Tenderness: There is no abdominal tenderness. There is no guarding.  Musculoskeletal:     Right lower leg: Edema present.     Left lower leg: Edema present.  Skin:    General: Skin is warm and dry.  Neurological:     Mental Status: She is alert.     Cranial Nerves: No cranial nerve deficit.     Sensory: No sensory deficit.  Psychiatric:        Behavior: Behavior normal.          Scheduled Medications:   amLODipine   10 mg Oral  Daily   aspirin  EC  81 mg Oral Daily   atorvastatin   40 mg Oral Daily   budesonide -glycopyrrolate -formoterol   2 puff Inhalation BID   cloNIDine   0.1 mg Oral TID   furosemide   80 mg Intravenous Once   heparin   5,000 Units Subcutaneous Q8H   insulin  aspart  0-9 Units Subcutaneous TID WC   isosorbide  mononitrate  30 mg Oral QHS   losartan   50 mg Oral Daily   pantoprazole   40 mg Oral Daily  Continuous Infusions:   PRN Medications:  hydrALAZINE , ipratropium-albuterol , midazolam  PF  Antimicrobials:  Anti-infectives (From admission, onward)    None           Data Reviewed: I have personally reviewed following labs and imaging studies  CBC: Recent Labs  Lab 08/28/24 1314  WBC 6.5  NEUTROABS 4.4  HGB 12.0  HCT 40.5  MCV 92.5  PLT 382   Basic Metabolic Panel: Recent Labs  Lab 08/28/24 1314  NA 145  K 3.3*  CL 112*  CO2 21*  GLUCOSE 149*  BUN 37*  CREATININE 3.19*  CALCIUM  9.2   GFR: CrCl cannot be calculated (Unknown ideal weight.). Liver Function Tests: Recent Labs  Lab 08/28/24 1314  AST 15  ALT 8  ALKPHOS 156*  BILITOT 0.3  PROT 6.8  ALBUMIN 3.1*   No results for input(s): LIPASE, AMYLASE in the last 168 hours. No results for input(s): AMMONIA in the last 168 hours. Coagulation Profile: No results for input(s): INR, PROTIME in the last 168 hours. Cardiac Enzymes: No results for input(s): CKTOTAL, CKMB, CKMBINDEX, TROPONINI in the last 168 hours. BNP (last 3 results) Recent Labs    07/30/24 0028  PROBNP 8,261.0*   HbA1C: No results for input(s): HGBA1C in the last 72 hours. CBG: Recent Labs  Lab 08/28/24 1251  GLUCAP 144*   Lipid Profile: No results for input(s): CHOL, HDL, LDLCALC, TRIG, CHOLHDL, LDLDIRECT in the last 72 hours. Thyroid  Function Tests: No results for input(s): TSH, T4TOTAL, FREET4, T3FREE, THYROIDAB in the last 72 hours. Anemia Panel: No results for input(s):  VITAMINB12, FOLATE, FERRITIN, TIBC, IRON , RETICCTPCT in the last 72 hours. Most Recent Urinalysis On File:     Component Value Date/Time   COLORURINE YELLOW (A) 08/28/2024 1537   APPEARANCEUR HAZY (A) 08/28/2024 1537   APPEARANCEUR Cloudy (A) 03/11/2020 1629   LABSPEC 1.024 08/28/2024 1537   PHURINE 5.0 08/28/2024 1537   GLUCOSEU 150 (A) 08/28/2024 1537   HGBUR SMALL (A) 08/28/2024 1537   BILIRUBINUR NEGATIVE 08/28/2024 1537   BILIRUBINUR Negative 03/11/2020 1629   KETONESUR NEGATIVE 08/28/2024 1537   PROTEINUR >=300 (A) 08/28/2024 1537   NITRITE NEGATIVE 08/28/2024 1537   LEUKOCYTESUR NEGATIVE 08/28/2024 1537   Sepsis Labs: @LABRCNTIP (procalcitonin:4,lacticidven:4)  No results found for this or any previous visit (from the past 240 hours).       Radiology Studies: DG Hand 2 View Left Result Date: 08/28/2024 EXAM: 1 OR 2 VIEW(S) XRAY OF THE LEFT HAND 08/28/2024 02:58:00 PM COMPARISON: None available. CLINICAL HISTORY: FINDINGS: BONES AND JOINTS: No acute fracture. No malalignment. Flexion of the second through fifth metacarpophalangeal joints. SOFT TISSUES: Diffuse soft tissue swelling of the hand and forearm. IMPRESSION: 1. Diffuse soft tissue swelling of the hand and forearm. 2. Flexion of the second through fifth metacarpophalangeal joints. Electronically signed by: Elsie Gravely MD 08/28/2024 03:06 PM EST RP Workstation: [id number]   DG Chest Portable 1 View Result Date: 08/28/2024 EXAM: 1 VIEW(S) XRAY OF THE CHEST 08/28/2024 02:05:00 PM COMPARISON: 07/30/2024 CLINICAL HISTORY: Anasarca. FINDINGS: LUNGS AND PLEURA: Mild bibasilar opacities. Bilateral trace pleural effusion. No pneumothorax. HEART AND MEDIASTINUM: Cardiomegaly. Aortic calcification. BONES AND SOFT TISSUES: No acute osseous abnormality. IMPRESSION: 1. Bilateral trace pleural effusions with mild bibasilar opacities. 2. Cardiomegaly. Electronically signed by: Elsie Gravely MD 08/28/2024 03:05 PM EST RP  Workstation: [id number]             LOS: 0 days        Laneta Blunt, DO [facility] 08/28/2024,  5:05 PM    Dictation software may have been used to generate the above note. Typos may occur and escape review in typed/dictated notes. Please contact Dr Marsa directly for clarity if needed.  Staff may message me via secure chat in Epic  but this may not receive an immediate response,  please page me for urgent matters!  If 7PM-7AM, please contact night coverage [email or web address]          [1] (Not in an outpatient encounter)  [2]  Allergies Allergen Reactions   Lisinopril Other (See Comments)    Cough  Other Reaction(s): Other (See Comments), Tongue swollen   Penicillins     Rash   "

## 2024-08-28 NOTE — Telephone Encounter (Signed)
 Spoke with patients sister Saul who was unaware that phone call was made. Stated patient has not been able to go to dialysis in a few days and believes she may have a UTI.Atkiola is also concerned that patient may have an infection on her palm from where she tightens her fist. Atkiola will be taking patient to ED [facility] as she is also concerned for recent behavior. Atkiola will keep pcp updated.

## 2024-08-28 NOTE — ED Notes (Signed)
 Pt placed on CCM and pulse ox. Pt ABCs intact. Pt sitting up in bed, family at bedside. Provider aware.

## 2024-08-28 NOTE — ED Notes (Signed)
 IVC PENDING  CONSULT ?

## 2024-08-28 NOTE — ED Notes (Signed)
IVC  MEDICAL  ADMIT 

## 2024-08-28 NOTE — ED Notes (Signed)
 Pt dressed in hospital gown with [facility] EDT. Pt belongings placed in 2 bags. Belongings kept at bedside out of reach from pt. Fall bundle in place.

## 2024-08-28 NOTE — ED Triage Notes (Addendum)
 Pt arrived with sister. Per sister pt has been not going to dialysis and has also been mentally unstable. Sister is attempting to get IVC papers but has not been able to get them. Pt has a mental health history and sister is worried that is playing a large role in the lack of care. Pt has not had dialysis since 01/20.

## 2024-08-28 NOTE — ED Notes (Signed)
 Pt continues to refuse changing into hospital attire and refusing care. Pt under IVC at this time. RN educated pt on process of dressing out and lab work. Pt continues to refuse. Leonor RN and Geographical Information Systems Officer attempted to provide care without pt refusal.

## 2024-08-28 NOTE — ED Provider Notes (Signed)
 "  [facility] Provider Note    Event Date/Time   First MD Initiated Contact with Patient 08/28/24 1259     (approximate)   History   Psychiatric Evaluation   HPI  Alicia Rogers is a 64 y.o. female  ESRD on dialysis, MGUS, paranoid schizophrenia, COPD, T2DM, hyperlipidemia, hypertension who presents from home with bizarre behavior medication noncompliance elevated blood pressures and generalized swelling.  History is obtained by patient's sister at bedside who the patient lives with.  Patient has been off of all of her medications since December and has missed overall a week and a half of dialysis.  Her sister has noticed that the patient's hands and face has become increasingly swollen.  Patient has been acting increasingly bizarre and paranoid for the past 3 weeks.   Patient tells me that she does not want anything done and that she does not have any medical diseases and that the man will protect her      Physical Exam   Triage Vital Signs: ED Triage Vitals [08/28/24 1252]  Encounter Vitals Group     BP (!) 182/80     Girls Systolic BP Percentile      Girls Diastolic BP Percentile      Boys Systolic BP Percentile      Boys Diastolic BP Percentile      Pulse Rate 81     Resp 18     Temp 98.6 F (37 C)     Temp Source Oral     SpO2 99 %     Weight      Height      Head Circumference      [facility] Flow      Pain Score      Pain Loc      Pain Education      Exclude from Growth Chart     Most recent vital signs: Vitals:   08/28/24 1252 08/28/24 1430  BP: (!) 182/80 (!) 179/73  Pulse: 81 71  Resp: 18 16  Temp: 98.6 F (37 C)   SpO2: 99% 100%    Nursing Triage Note reviewed. Vital signs reviewed and patients oxygen saturation is normoxic  General: Patient is well nourished, well developed, awake and alert, appears unwell Head: Normocephalic and atraumatic Eyes: Normal inspection, extraocular muscles intact, no conjunctival  pallor Ear, nose, throat: Facial edema Neck: Normal range of motion Respiratory: Patient is in no respiratory distress, lungs CTAB Cardiovascular: Patient is not tachycardic, RR GI: Abd SNT with no guarding or rebound  Back: Normal inspection of the back with good strength and range of motion throughout all ext Extremities: pulses intact with good cap refills, 1+ LE pitting edema or calf tenderness Patient does have extremity edema  Neuro: The patient is alert and oriented to person, place, and time, appropriately conversive, with 5/5 bilat UE/LE strength, no gross motor or sensory defects noted. Coordination appears to be adequate. Skin: Warm, dry, and intact Psych: Paranoid, themes of persecution, no SI or HI  ED Results / Procedures / Treatments   Labs (all labs ordered are listed, but only abnormal results are displayed) Labs Reviewed  CBC WITH DIFFERENTIAL/PLATELET - Abnormal; Notable for the following components:      Result Value   MCHC 29.6 (*)    RDW 15.9 (*)    All other components within normal limits  COMPREHENSIVE METABOLIC PANEL WITH GFR - Abnormal; Notable for the following components:   Potassium 3.3 (*)  Chloride 112 (*)    CO2 21 (*)    Glucose, Bld 149 (*)    BUN 37 (*)    Creatinine, Ser 3.19 (*)    Albumin 3.1 (*)    Alkaline Phosphatase 156 (*)    GFR, Estimated 16 (*)    All other components within normal limits  CBG MONITORING, ED - Abnormal; Notable for the following components:   Glucose-Capillary 144 (*)    All other components within normal limits  CULTURE, BLOOD (ROUTINE X 2)  CULTURE, BLOOD (ROUTINE X 2)  ETHANOL  URINALYSIS, ROUTINE W REFLEX MICROSCOPIC  URINE DRUG SCREEN  LACTIC ACID, PLASMA     EKG Pending   RADIOLOGY Chest x-ray: Pleural edema my independent review interpretation radiologist agrees Hand x-ray: Diffuse edema but no bony breakdown on my independent review interpretation radiologist  agrees    PROCEDURES:  Critical Care performed: Yes, see critical care procedure note(s)  .Critical Care  Performed by: Nicholaus Rolland BRAVO, MD Authorized by: Nicholaus Rolland BRAVO, MD   Critical care provider statement:    Critical care time (minutes):  35   Critical care was time spent personally by me on the following activities:  Development of treatment plan with patient or surrogate, discussions with consultants, evaluation of patient's response to treatment, examination of patient, ordering and review of laboratory studies, ordering and review of radiographic studies, ordering and performing treatments and interventions, pulse oximetry, re-evaluation of patient's condition and review of old charts    MEDICATIONS ORDERED IN ED: Medications  midazolam  (VERSED ) 5 MG/5ML injection 5 mg (5 mg Intramuscular Given 08/28/24 1512)     IMPRESSION / MDM / ASSESSMENT AND PLAN / ED COURSE                                Differential diagnosis includes, but is not limited to, organic psychiatric disorder, anasarca, cellulitis, pleural edema, electrolyte derangement, UTI  ED course: Patient presents with obvious evidence of paranoid delusions.  Her blood pressure is extremely elevated and she appears anasarcic with missing several dialysis sessions.  It is unclear to me whether her left hand has evidence of cellulitis.  Unfortunately patient is refusing most interventions including insertion of IV changing into a gown.  She was be able to be convinced for a straight stick for blood work.  Unfortunately all sedation is dangerous in this patient given lack of EKG and lack of medication adherence.  Patient's sister and I were in agreement that patient does not have the capacity at the moment to make decisions and I have placed her on a involuntary hold.  Anticipate patient however will likely need to be admitted to medicine once we have further information back.   Clinical Course as of 08/28/24 1543   Tue Aug 28, 2024  1340 I reviewed the indications benefits risks and alternatives I will placing the patient on involuntary hold and given medication to facilitate workup with the patient cystoscopy we voiced understanding and are opting to proceed at this point [HD]  1432 Potassium(!): 3.3 Not elevated [HD]  1505 S/o from Dr. Nicholaus: 45F hs schizophrenia, ESRD on HD, MGUS - not taking meds - has not been to dialysis for 1.5 wks - IVC placed  Getting IV, sedation  Plan for admission [MM]  1539 Case discussed with hospitalist for admission [HD]    Clinical Course User Index [HD] Nicholaus Rolland BRAVO, MD [MM] Clarine Sharper  A, MD   -- Risk: 5 This patient has a high risk of morbidity due to further diagnostic testing or treatment. Rationale: This patients evaluation and management involve a high risk of morbidity due to the potential severity of presenting symptoms, need for diagnostic testing, and/or initiation of treatment that may require close monitoring. The differential includes conditions with potential for significant deterioration or requiring escalation of care. Treatment decisions in the ED, including medication administration, procedural interventions, or disposition planning, reflect this level of risk. COPA: 5 The patient has the following acute or chronic illness/injury that poses a possible threat to life or bodily function: [X] : The patient has a potentially serious acute condition or an acute exacerbation of a chronic illness requiring urgent evaluation and management in the Emergency Department. The clinical presentation necessitates immediate consideration of life-threatening or function-threatening diagnoses, even if they are ultimately ruled out.   FINAL CLINICAL IMPRESSION(S) / ED DIAGNOSES   Final diagnoses:  Anasarca  Non compliance w medication regimen  Delusions (HCC)  ESRD on dialysis [facility])     Rx / DC Orders   ED Discharge Orders     None         Note:  This document was prepared using Dragon voice recognition software and may include unintentional dictation errors.   Nicholaus Rolland BRAVO, MD 08/28/24 [phone]  "

## 2024-08-29 DIAGNOSIS — R601 Generalized edema: Secondary | ICD-10-CM | POA: Diagnosis not present

## 2024-08-29 LAB — ECHOCARDIOGRAM COMPLETE
Area-P 1/2: 3.08 cm2
S' Lateral: 3 cm

## 2024-08-29 LAB — GLUCOSE, CAPILLARY
Glucose-Capillary: 125 mg/dL — ABNORMAL HIGH (ref 70–99)
Glucose-Capillary: 168 mg/dL — ABNORMAL HIGH (ref 70–99)
Glucose-Capillary: 160 mg/dL — ABNORMAL HIGH (ref 70–99)
Glucose-Capillary: 192 mg/dL — ABNORMAL HIGH (ref 70–99)

## 2024-08-29 LAB — BASIC METABOLIC PANEL WITH GFR
Anion gap: 12 (ref 5–15)
BUN: 41 mg/dL — ABNORMAL HIGH (ref 8–23)
CO2: 19 mmol/L — ABNORMAL LOW (ref 22–32)
Calcium: 8.2 mg/dL — ABNORMAL LOW (ref 8.9–10.3)
Chloride: 112 mmol/L — ABNORMAL HIGH (ref 98–111)
Creatinine, Ser: 3.35 mg/dL — ABNORMAL HIGH (ref 0.44–1.00)
GFR, Estimated: 15 mL/min — ABNORMAL LOW
Glucose, Bld: 174 mg/dL — ABNORMAL HIGH (ref 70–99)
Potassium: 3.1 mmol/L — ABNORMAL LOW (ref 3.5–5.1)
Sodium: 142 mmol/L (ref 135–145)

## 2024-08-29 MED ORDER — PALIPERIDONE ER 3 MG PO TB24
3.0000 mg | ORAL_TABLET | Freq: Every day | ORAL | Status: DC
  Administered 2024-08-29 – 2024-08-31 (×3): 3 mg via ORAL
  Filled 2024-08-29 (×3): qty 1

## 2024-08-29 MED ORDER — POTASSIUM CHLORIDE CRYS ER 20 MEQ PO TBCR
40.0000 meq | EXTENDED_RELEASE_TABLET | Freq: Once | ORAL | Status: AC
  Administered 2024-08-29: 40 meq via ORAL
  Filled 2024-08-29: qty 2

## 2024-08-29 MED ORDER — FUROSEMIDE 10 MG/ML IJ SOLN
80.0000 mg | Freq: Once | INTRAMUSCULAR | Status: DC
  Filled 2024-08-29: qty 8

## 2024-08-29 MED ORDER — ALLOPURINOL 100 MG PO TABS
100.0000 mg | ORAL_TABLET | Freq: Every day | ORAL | Status: DC
  Administered 2024-08-29 – 2024-08-31 (×3): 100 mg via ORAL
  Filled 2024-08-29 (×4): qty 1

## 2024-08-29 NOTE — Plan of Care (Signed)
" °  Problem: Education: Goal: Ability to describe self-care measures that may prevent or decrease complications (Diabetes Survival Skills Education) will improve Outcome: Progressing   Problem: Skin Integrity: Goal: Risk for impaired skin integrity will decrease Outcome: Not Progressing   Problem: Education: Goal: Knowledge of General Education information will improve Description: Including pain rating scale, medication(s)/side effects and non-pharmacologic comfort measures Outcome: Not Progressing   Problem: Activity: Goal: Risk for activity intolerance will decrease Outcome: Not Progressing   "

## 2024-08-29 NOTE — Progress Notes (Signed)
 Heart Failure Navigator Progress Note Assessed for Heart & Vascular TOC clinic readiness.  Patient does not meet criteria due to current ESRD on HD.   Navigator will sign off at this time.  Charmaine Pines, RN, BSN [facility] Heart Failure Navigator Secure Chat Only

## 2024-08-29 NOTE — Consult Note (Signed)
 "                                                                                   Consultation Note Date: 08/29/2024 at 1230  Patient Name: Alicia Rogers  DOB: 08/19/1960  MRN: [id number]  Age / Sex: 64 y.o., female  PCP: Austin Candyce Stall, MD Referring Physician: Kandis Devaughn Sayres, MD  HPI/Patient Profile: 64 y.o. female  with past medical history of type 2 diabetes, CKD, MGUS, schizoaffective disorder, depression, HTN, GERD, ESRD, secondary hyperparathyroidism, and bipolar admitted on 08/28/2024 with chief complaint of sisters concerns for patient missing dialysis.  As per chart review, patient has not been to dialysis since 08/11/2024.  Upon presentation to the ED, patient was conversational but her speech was slow and slurred.  She was able to confirm she is at the hospital without any breathing problems.   As per chart review, patient is shared with the previous attending reasons for not going to dialysis include they are talking bad about me there and just not wanting to go.  Patient's sister Alicia Rogers shares she is working with DSS to arrange guardianship and is concerned with patient's increased shortness of breath over the past few days thus resulting in admission to ED.  Patient is currently being treated for anasarca, ESRD, schizoaffective disorder.  Psychiatry and nephrology were consulted.  As per psychiatry's evaluation and recommendations, patient is involuntarily committed and was recommended for inpatient psychiatric admission following medical stabilization.  PMT was consulted by Dr. Kandis on 1/28 for goals of care-significant psych problems and intermittently refusing dialysis.   Of note, patient is familiar to PMT as I met with patient during her October 2024 admission.  Clinical Assessment and Goals of Care: Chart review completed prior to meeting patient including: -Labs: Potassium 3.1, creatinine 3.35, GFR 15 -Vital signs -Imaging: Echocardiogram on 1/27 revealed EF of  55-60% -Available advanced directive documents from current and previous encounters: I instructed available in ACP tab of epic  I then met with patient at bedside to discuss diagnosis prognosis, GOC, EOL wishes, disposition and options.  Nurse tech/sitter is present at bedside during her visit.  She endorses patient was able to stand and go to the bathroom with standby assistance.  Patient is also had some of the lunch food at bedside and tolerated it without difficulty.  I introduced Palliative Medicine as specialized medical care for people living with serious illness. It focuses on providing relief from the symptoms and stress of a serious illness. The goal is to improve quality of life for both the patient and the family.  I did not have a brief life review with patient as she is familiar to me.  She is never married and has no children.  She is 1 of 6 siblings.  She was recently living with her sister Alicia Rogers.  As far as functional and nutritional status, patient endorses she had a normal diet/p.o. intake and was able to complete ADLs independently PTA.  We discussed patient's current illness and what it means in the larger context of patient's on-going co-morbidities.   To gauge patient's understanding of her current medical situation.  She shares she  is in the hospital for them to check, heart, lungs.  I clarified that patient has presented to the ED with concerns of her continuing to miss dialysis and refusing HD.  She shares she does not want to sit there for 5 hours on that machine.  Discussed autonomy and agency over medical decision making.  However, also highlighted patient has exhibited paranoia and lack of self-care with sister which prompted presentation to the hospital.  She shares she has missed 3 sessions of dialysis but that they told her her kidneys were doing just fine at her last session.  I cautioned her that perhaps her timeline is off since she has missed 3 dialysis sessions  and her creatinine is 3.35.    She shares she understands she needs to have dialysis in order to keep living.  When asked to elaborate further on why she has been avoiding adhering to medication regimen and participating in hemodialysis, she shares she does not want to have to sit in the chair for that long.  Discussed potentially having dialysis here in the hospital.  She says she will consider it.  She just does not like to sit still for that long.  Encouraged her to consider use of hemodialysis for as long as she can tolerate.  Discussed pros and cons of continuing with hemodialysis or continuing to avoid it.  Very clearly stated that if patient continues with hemodialysis then she could likely return to her prior state and continue with medication regimen for being independent with ADLs.  However, I also highlighted that should patient continue to reject hemodialysis and not adhere to medication regimen, she is facing end-of-life.  Again, patient endorses she knows she needs to have hemodialysis to live.  However, at this time she is not prepared today whether or not she would like to continue with dialysis.  She was in agreement for me to meet with her tomorrow to continue discussions surrounding boundaries and goals of care.  Of note, my visit in October 2024 reflect patient is accepting of all offered, available, and appropriate medical interventions to sustain her life.  Given she is IVC at the moment she is unable to make changes to CODE STATUS and make her medical decisions.  However, I think it is important to highlight patient's previous wishes when medically stable.  No change to plan of care at this time.  Above conveyed in person to attending.  Physical Exam Vitals reviewed.  Constitutional:      General: She is not in acute distress.    Appearance: She is normal weight.  HENT:     Head: Normocephalic.     Mouth/Throat:     Mouth: Mucous membranes are moist.  Eyes:      Pupils: Pupils are equal, round, and reactive to light.  Pulmonary:     Effort: Pulmonary effort is normal.  Abdominal:     Palpations: Abdomen is soft.  Skin:    General: Skin is warm and dry.  Neurological:     Mental Status: She is alert and oriented to person, place, and time.  Psychiatric:        Mood and Affect: Mood normal.        Behavior: Behavior normal.     Palliative Assessment/Data: 60-70%     Thank you for this consult. Palliative medicine will continue to follow and assist holistically.   I personally spent a total of 75 minutes in the care of the patient today including preparing  to see the patient, getting/reviewing separately obtained history, performing a medically appropriate exam/evaluation, counseling and educating, referring and communicating with other health care professionals, and documenting clinical information in the EHR.   Signed by: Lamarr Gunner, DNP, FNP-BC Palliative Medicine   Please contact Palliative Medicine Team providers via [facility] for questions and concerns.                "

## 2024-08-29 NOTE — Consult Note (Signed)
 [facility] Psychiatric Consult Initial  Patient Name: .Alicia Rogers  MRN: [id number]  DOB: June 25, 1961  Consult Order details:  Orders (From admission, onward)     Start     Ordered   08/28/24 1635  IP CONSULT TO PSYCHIATRY       Comments: Secure chat to Dr Donnelly today acknowledged  Ordering Provider: Marsa Edelman, DO  Provider:  Donnelly Mellow, MD  Question Answer Comment  Location [facility]   Reason for Consult? schizoaffective disorder / schizophrenia, evaluate for [facility] admission or at minimum medication recommendations, thanks!      08/28/24 1634   08/28/24 1444  IP CONSULT TO PSYCHIATRY       Ordering Provider: Nicholaus Rolland BRAVO, MD  Provider:  (Not yet assigned)  Question:  Reason for consult:  Answer:  Medication management   08/28/24 1443             Mode of Visit: In person    Psychiatry Consult Evaluation  Service Date: August 29, 2024 LOS:  LOS: 1 day  Chief Complaint It was burning when I peed and I couldn't breathe  Primary Psychiatric Diagnoses  Schizoaffective disorder, bipolar type (HCC)   Assessment  CLINICAL DECISION MAKING:  Schizoaffective disorder, bipolar type, with acute decompensation related to medication non-adherence. Patient demonstrates significant impairment in insight and judgment regarding her psychiatric condition, evidenced by her belief that she is not schizophrenic anymore despite documented diagnosis and history. Patient has chronic kidney disease requiring dialysis, which poses risk for medication accumulation as paliperidone  is substantially excreted by the kidneys.However, sister reports patient was previously stable on Invega  for extended period even prior to dialysis initiation.  Patient currently meets criteria for continued IVC due to concerns for her safety, evidenced by recent behaviors including disorientation, confusion requiring police intervention, and refusal of life-sustaining  dialysis treatment. Her impaired insight into her psychiatric condition and inability to recognize need for ongoing treatment further supports continued involuntary commitment.  Treatment plan and recommendations relayed to medical team.  Diagnoses:  Active Hospital problems: Principal Problem:   Anasarca Active Problems:   Diabetes mellitus, type 2 (HCC)   Essential hypertension   Schizoaffective disorder, bipolar type (HCC)   Paranoid schizophrenia (HCC)   COPD (chronic obstructive pulmonary disease) (HCC)   ESRD on dialysis (HCC)    Plan   ## Disposition: Recommended to uphold IVC and patient will be recommended for inpatient psychiatric admission following medical stabilization.  ## Psychiatric Medication Recommendations: Reinitiated patient's oral Invega  3 mg daily due to patient not receiving since October of last year.  Will plan to transition back to [facility], but will defer to inpatient psychiatric facility. Risk-benefit discussion completed with patient and sister, who verbalized understanding and provided informed consent. Sister confirmed historical stability and tolerability on this medication.   ## Medical Decision Making Capacity: Not specifically addressed in this encounter  ## Further Work-up:  EKG- QTC: 397 on 08/28/2024 Labs: CBC, CMP, urine drug screen, ethanol  ## Behavioral / Environmental: - No specific recommendations at this time.     ## Safety and Observation Level:  - Based on my clinical evaluation, I estimate the patient to be at low risk of self harm in the current setting.  Per unit protocol, a patient under IVC is required one-to-one sitter on medical floor.  please continue with protocol requirements. - At this time, we recommend  routine. This decision is based on my review of the chart including patient's history and  current presentation, interview of the patient, mental status examination, and consideration of suicide risk including evaluating suicidal  ideation, plan, intent, suicidal or self-harm behaviors, risk factors, and protective factors. This judgment is based on our ability to directly address suicide risk, implement suicide prevention strategies, and develop a safety plan while the patient is in the clinical setting. Please contact our team if there is a concern that risk level has changed.  CSSR Risk Category:C-SSRS RISK CATEGORY:  Flowsheet Row ED to Hosp-Admission (Current) from 08/28/2024 in [facility] from 07/29/2024 in [facility] Department at [facility] ED from 05/25/2024 in [facility] Department at [facility]  C-SSRS RISK CATEGORY No Risk No Risk No Risk     Suicide Risk Assessment: Patient has following modifiable risk factors for suicide: medication noncompliance, lack of access to outpatient mental health resources, and pain, medical illness (ie new dx of cancer), which we are addressing by recommending inpatient psychiatric admission for further monitoring and stabilization.  Patient has following non-modifiable or demographic risk factors for suicide: psychiatric hospitalization  Patient has the following protective factors against suicide: Supportive family, no history of suicide attempts, and no history of NSSIB  Thank you for this consult request. Recommendations have been communicated to the primary team.  We will continue to round while patient medically admitted at this time.  We have reached out to behavioral health ACs as medical team has notified of medical clearance for potential bed placement for inpatient psychiatric facility.      History of Present Illness  Relevant Aspects of Hospital Hospital   Patient Report:     Psychiatric and Social History  Psychiatric History:  Information collected from patient/chart  Prev Dx/Sx: Schizoaffective disorder, bipolar type, history of catatonia Current Psych Provider: Previously managed  by [facility], has not had recent visit Home Meds (current): Previously prescribed Invega , Lexapro , clonidine , Wellbutrin  Previous Med Trials: See above Therapy: See above Prior Psych Hospitalization: Patient denied Prior Suicide attempt/Self Harm: Patient denied Prior Violence: Patient denied  Family Psych History: Unknown Family Hx suicide: Unknown  Social History:  Living Situation: With sister  Access to weapons/lethal means: Denied  Substance History Alcohol: UTA Last Drink :UTA Number of drinks per day :UTA History of alcohol withdrawal seizures:UTA History of DT's:UTA Tobacco: UTA Illicit drugs: UTA Prescription drug abuse: UTA Rehab hx: UTA  Exam Findings  Physical Exam: Reviewed and agree with the physical exam findings conducted by the medical provider Vital Signs:  Temp:  [97.5 F (36.4 C)-98.6 F (37 C)] 97.9 F (36.6 C) (01/28 1219) Pulse Rate:  [48-71] 57 (01/28 1219) Resp:  [16-20] 20 (01/28 1219) BP: (102-197)/(47-73) 124/53 (01/28 1219) SpO2:  [98 %-100 %] 100 % (01/28 1219) Blood pressure (!) 124/53, pulse (!) 57, temperature 97.9 F (36.6 C), resp. rate 20, SpO2 100%. There is no height or weight on file to calculate BMI.    Mental Status Exam: General Appearance: Guarded  Orientation:  Full (Time, Place, and Person)  Memory:  Fair  Concentration:  Concentration: Fair  Recall:  Fair  Attention  Fair  Eye Contact:  Minimal  Speech:  Slow  Language:  Fair  Volume:  Decreased  Mood: okay  Affect:  Blunt  Thought Process:  Coherent    Suicidal Thoughts:  No  Homicidal Thoughts:  No  Judgement:  Poor  Insight:  Lacking  Psychomotor Activity:  Normal  Akathisia:  No  Fund of Knowledge:  Poor  Assets:  Engineer, Maintenance Social Support  Cognition:  Impaired  ADL's:  Intact  AIMS (if indicated):        Other History   These have been pulled in through the EMR, reviewed, and updated if appropriate.  Family  History:  The patient's family history includes Depression in her mother and sister; Diabetes in her father, mother, and sister; Glaucoma in her father and mother; Heart attack in her mother; Heart disease in her mother; Hypertension in her father and mother; Post-traumatic stress disorder in her sister; Prostate cancer in her father.  Medical History: Past Medical History:  Diagnosis Date   Anemia    Aortic atherosclerosis    Bilateral adrenal adenomas    CKD (chronic kidney disease), stage V (HCC)    Depression    Diabetes mellitus, type II (HCC)    Diverticulosis    Hand swelling 09/27/2021   Hypertension    Paranoid schizophrenia (HCC)    Proteinuria    Schizophrenia (HCC)     Surgical History: Past Surgical History:  Procedure Laterality Date   A/V FISTULAGRAM Left 10/26/2023   Procedure: A/V Fistulagram;  Surgeon: Marea Selinda RAMAN, MD;  Location: [facility] INVASIVE CV LAB;  Service: Cardiovascular;  Laterality: Left;   AV FISTULA PLACEMENT Left 07/14/2023   Procedure: ARTERIOVENOUS (AV) FISTULA CREATION;  Surgeon: Marea Selinda RAMAN, MD;  Location: [facility] ORS;  Service: Vascular;  Laterality: Left;   COLONOSCOPY WITH PROPOFOL  N/A 07/21/2022   Procedure: COLONOSCOPY WITH PROPOFOL ;  Surgeon: Unk Corinn Skiff, MD;  Location: [facility] ENDOSCOPY;  Service: Gastroenterology;  Laterality: N/A;   colonscopy     ESOPHAGOGASTRODUODENOSCOPY (EGD) WITH PROPOFOL  N/A 07/21/2022   Procedure: ESOPHAGOGASTRODUODENOSCOPY (EGD) WITH PROPOFOL ;  Surgeon: Unk Corinn Skiff, MD;  Location: [facility] ENDOSCOPY;  Service: Gastroenterology;  Laterality: N/A;   LOWER EXTREMITY ANGIOGRAPHY Right 07/08/2023   Procedure: Lower Extremity Angiography;  Surgeon: Marea Selinda RAMAN, MD;  Location: [facility] INVASIVE CV LAB;  Service: Cardiovascular;  Laterality: Right;     Medications:  Current Medications[1]  Allergies: Allergies[2]  A. Claudene, NP This note was created using Scientist, clinical (histocompatibility and immunogenetics). Please excuse any inadvertent  transcription errors. Case was discussed with supervising physician Dr. Jadapalle who is agreeable with current plan.       [1]  Current Facility-Administered Medications:    allopurinol  (ZYLOPRIM ) tablet 100 mg, 100 mg, Oral, Daily, Wouk, Devaughn Sayres, MD, 100 mg at 08/29/24 1352   amLODipine  (NORVASC ) tablet 10 mg, 10 mg, Oral, Daily, [name], Natalie, DO, 10 mg at 08/29/24 0908   aspirin  EC tablet 81 mg, 81 mg, Oral, Daily, [name], Natalie, DO, 81 mg at 08/29/24 0908   atorvastatin  (LIPITOR) tablet 40 mg, 40 mg, Oral, Daily, [name], Natalie, DO, 40 mg at 08/28/24 2212   budesonide -glycopyrrolate -formoterol  (BREZTRI ) 160-9-4.8 MCG/ACT inhaler 2 puff, 2 puff, Inhalation, BID, [name], Natalie, DO   cloNIDine  (CATAPRES ) tablet 0.1 mg, 0.1 mg, Oral, TID, [name], Natalie, DO, 0.1 mg at 08/29/24 0908   heparin  injection 5,000 Units, 5,000 Units, Subcutaneous, Q8H, [name], Natalie, DO   hydrALAZINE  (APRESOLINE ) injection 10 mg, 10 mg, Intravenous, Q6H PRN, [name], Natalie, DO   insulin  aspart (novoLOG ) injection 0-9 Units, 0-9 Units, Subcutaneous, TID WC, [name], Natalie, DO   ipratropium-albuterol  (DUONEB) 0.5-2.5 (3) MG/3ML nebulizer solution 3 mL, 3 mL, Nebulization, Q4H PRN, [name], Natalie, DO   isosorbide  mononitrate (IMDUR ) 24 hr tablet 30 mg, 30 mg, Oral, QHS, [name], Natalie, DO, 30 mg at 08/28/24 2212   losartan  (COZAAR ) tablet 50 mg,  50 mg, Oral, Daily, [name], Natalie, DO, 50 mg at 08/29/24 0908   midazolam  PF (VERSED ) injection 2 mg, 2 mg, Intravenous, Q2H PRN, [name], Natalie, DO   paliperidone  (INVEGA ) 24 hr tablet 3 mg, 3 mg, Oral, Daily, Dianne Whelchel B, NP, 3 mg at 08/29/24 1353   pantoprazole  (PROTONIX ) EC tablet 40 mg, 40 mg, Oral, Daily, [name], Natalie, DO, 40 mg at 08/29/24 0908 [2]  Allergies Allergen Reactions   Lisinopril Other (See Comments)    Cough  Other Reaction(s): Other (See Comments), Tongue swollen   Penicillins     Rash

## 2024-08-29 NOTE — Progress Notes (Signed)
 Pt receives outpt HD at [facility] [address] [address] on T,S at 11:15am. Navigator following to assist with any HD needs.   Suzen Satchel Dialysis Navigator [phone]

## 2024-08-29 NOTE — Progress Notes (Signed)
 " PROGRESS NOTE    Alicia Rogers  FMW:[id number] DOB: 01/17/1961 DOA: 08/28/2024 PCP: Austin Candyce Stall, MD     Brief Narrative:    Spoke w/ patient at bedside in ED. She has gotten dose Versed  in the ED to allow staff to place an IV. She is conversational but speech is slow / slurred. She can tell me she is at the hospital for breathing problems. She states she doesn't want dialysis just dont want to despite education that refusing dialysis will result in eventual death.   Spoke w/ sister Ajee Heasley who gave some more information: She states things started this time around Oct 2025 - patient was kicked out of [facility]. She was living w/ her sister [name]. [name] states that [name] was given a list of her medications / prescriptions. Rx unable to be filled at first until patient went to primary care few weeks ago, they rx some meds for her to [facility]. [name] states that now Burlene wont take the medications. Corey is giving a lot of different reasons for not going to dialysis, ranging from thery're talking bad about me there to just not wanting to go. [name] also states that Journiee was getting a monthly injection for schizophrenia at [facility] - last time she got the shot was probably 05/2024. [name] is working w/ DSS to arrange guardianship whether guardian is family or the state. Only other concern at this time is the shortness of breath worsening over past few days and also concern about feet and hands - nails are long, she has not been letting her sisters cut her nails. [name] states that she has not been letting us  do much to help her and I had to raise my voice to even get her to come to the ER and I hate doing that but she won't listen. Last dialysis was 08/11/24.    Assessment & Plan:   Principal Problem:   Anasarca Active Problems:   COPD (chronic obstructive pulmonary disease) (HCC)   Essential hypertension   Schizoaffective disorder, bipolar type (HCC)   Diabetes  mellitus, type 2 (HCC)   Paranoid schizophrenia (HCC)   ESRD on dialysis (HCC)  # ESRD Intermittently refusing dialysis. Nephrology advises against sedating/restraining for this. Working to get mental illness under better control - nephrology following - palliative consult - refused dialysis today, will check bmp, repeat lasix   # Schizoaffective disorder Mental illness significant contributor to underlying medical problems - BH following, have re-started invega  - medically stable for inpatient psych  # HTN Controlled - home amlodipine , clonidine , losartan   # T2DM Glucose controlled - ssi  # COPD No exacerbation - home breztri   # CAD No chest pain - home asa, statin, losartan , imdur   # Gout - home allopurinol    DVT prophylaxis: heparin  Code Status: full Family Communication: sister aki updated telephonically 1/28  Level of care: Med-Surg Status is: Inpatient Remains inpatient appropriate because: dispo planning underway    Consultants:  Nephrology Behavioral health  Procedures: None thus far  Antimicrobials:  none    Subjective: Reports feeling fine  Objective: Vitals:   08/28/24 1805 08/28/24 1930 08/29/24 0422 08/29/24 1036  BP: (!) 197/65 (!) 165/69 (!) 102/53 (!) 113/47  Pulse: 65 (!) 58 (!) 48 61  Resp: 16 17 17 20   Temp: (!) 97.5 F (36.4 C) 98.1 F (36.7 C) 98.2 F (36.8 C) 98.6 F (37 C)  TempSrc: Oral     SpO2: 100% 100% 100% 100%  Intake/Output Summary (Last 24 hours) at 08/29/2024 1135 Last data filed at 08/29/2024 1000 Gross per 24 hour  Intake 720 ml  Output --  Net 720 ml   There were no vitals filed for this visit.  Examination:  General exam: Appears calm and comfortable, chronically ill  Respiratory system: Clear to auscultation. Respiratory effort normal. Cardiovascular system: S1 & S2 heard, RRR. Gastrointestinal system: Abdomen is nondistended, soft and nontender.   Central nervous system: Alert, oriented to  self Extremities: Symmetric 5 x 5 power. Skin: No rashes, lesions or ulcers Psychiatry: calm, guarded     Data Reviewed: I have personally reviewed following labs and imaging studies  CBC: Recent Labs  Lab 08/28/24 1314  WBC 6.5  NEUTROABS 4.4  HGB 12.0  HCT 40.5  MCV 92.5  PLT 382   Basic Metabolic Panel: Recent Labs  Lab 08/28/24 1314  NA 145  K 3.3*  CL 112*  CO2 21*  GLUCOSE 149*  BUN 37*  CREATININE 3.19*  CALCIUM  9.2   GFR: CrCl cannot be calculated (Unknown ideal weight.). Liver Function Tests: Recent Labs  Lab 08/28/24 1314  AST 15  ALT 8  ALKPHOS 156*  BILITOT 0.3  PROT 6.8  ALBUMIN 3.1*   No results for input(s): LIPASE, AMYLASE in the last 168 hours. No results for input(s): AMMONIA in the last 168 hours. Coagulation Profile: No results for input(s): INR, PROTIME in the last 168 hours. Cardiac Enzymes: No results for input(s): CKTOTAL, CKMB, CKMBINDEX, TROPONINI in the last 168 hours. BNP (last 3 results) Recent Labs    07/30/24 0028 08/28/24 1314  PROBNP 8,261.0* 9,877.0*   HbA1C: Recent Labs    08/28/24 1314  HGBA1C 7.2*   CBG: Recent Labs  Lab 08/28/24 1251 08/28/24 2107 08/29/24 0712  GLUCAP 144* 189* 125*   Lipid Profile: No results for input(s): CHOL, HDL, LDLCALC, TRIG, CHOLHDL, LDLDIRECT in the last 72 hours. Thyroid  Function Tests: Recent Labs    08/28/24 1314  TSH 1.860   Anemia Panel: No results for input(s): VITAMINB12, FOLATE, FERRITIN, TIBC, IRON , RETICCTPCT in the last 72 hours. Urine analysis:    Component Value Date/Time   COLORURINE YELLOW (A) 08/28/2024 1537   APPEARANCEUR HAZY (A) 08/28/2024 1537   APPEARANCEUR Cloudy (A) 03/11/2020 1629   LABSPEC 1.024 08/28/2024 1537   PHURINE 5.0 08/28/2024 1537   GLUCOSEU 150 (A) 08/28/2024 1537   HGBUR SMALL (A) 08/28/2024 1537   BILIRUBINUR NEGATIVE 08/28/2024 1537   BILIRUBINUR Negative 03/11/2020 1629    KETONESUR NEGATIVE 08/28/2024 1537   PROTEINUR >=300 (A) 08/28/2024 1537   NITRITE NEGATIVE 08/28/2024 1537   LEUKOCYTESUR NEGATIVE 08/28/2024 1537   Sepsis Labs: @LABRCNTIP (procalcitonin:4,lacticidven:4)  )No results found for this or any previous visit (from the past 240 hours).       Radiology Studies: ECHOCARDIOGRAM COMPLETE Result Date: 08/29/2024    ECHOCARDIOGRAM REPORT   Patient Name:   Alicia Rogers Date of Exam: 08/28/2024 Medical Rec #:  [id number]          Height:       63.0 in Accession #:    [id number]         Weight:       125.0 lb Date of Birth:  02/24/1961           BSA:          1.584 m Patient Age:    63 years           BP:  197/65 mmHg Patient Gender: F                  HR:           55 bpm. Exam Location:  [facility] Procedure: 2D Echo, Cardiac Doppler and Color Doppler (Both Spectral and Color            Flow Doppler were utilized during procedure). Indications:     I51.7 Cardiomegaly  History:         Patient has prior history of Echocardiogram examinations, most                  recent 05/10/2023. Risk Factors:Hypertension and Diabetes.  Sonographer:     Carl Coma RDCS Referring Phys:  [id number] LANETA BLUNT Diagnosing Phys: Keller Alluri IMPRESSIONS  1. Left ventricular ejection fraction, by estimation, is 55 to 60%. The left ventricle has normal function. The left ventricle has no regional wall motion abnormalities. There is mild left ventricular hypertrophy. Left ventricular diastolic parameters are consistent with Grade I diastolic dysfunction (impaired relaxation).  2. Right ventricular systolic function is normal. The right ventricular size is normal. There is mildly elevated pulmonary artery systolic pressure.  3. Left atrial size was mildly dilated.  4. The mitral valve is normal in structure. Trivial mitral valve regurgitation.  5. The aortic valve is tricuspid. Aortic valve regurgitation is not visualized.  6. The inferior vena cava is normal in  size with <50% respiratory variability, suggesting right atrial pressure of 8 mmHg. FINDINGS  Left Ventricle: Left ventricular ejection fraction, by estimation, is 55 to 60%. The left ventricle has normal function. The left ventricle has no regional wall motion abnormalities. The left ventricular internal cavity size was normal in size. There is  mild left ventricular hypertrophy. Left ventricular diastolic parameters are consistent with Grade I diastolic dysfunction (impaired relaxation). Right Ventricle: The right ventricular size is normal. No increase in right ventricular wall thickness. Right ventricular systolic function is normal. There is mildly elevated pulmonary artery systolic pressure. The tricuspid regurgitant velocity is 2.96  m/s, and with an assumed right atrial pressure of 8 mmHg, the estimated right ventricular systolic pressure is 43.0 mmHg. Left Atrium: Left atrial size was mildly dilated. Right Atrium: Right atrial size was normal in size. Pericardium: There is no evidence of pericardial effusion. Mitral Valve: The mitral valve is normal in structure. Trivial mitral valve regurgitation. Tricuspid Valve: The tricuspid valve is normal in structure. Tricuspid valve regurgitation is mild. Aortic Valve: The aortic valve is tricuspid. Aortic valve regurgitation is not visualized. Pulmonic Valve: The pulmonic valve was not well visualized. Pulmonic valve regurgitation is not visualized. Aorta: The aortic root is normal in size and structure. Venous: The inferior vena cava is normal in size with less than 50% respiratory variability, suggesting right atrial pressure of 8 mmHg. IAS/Shunts: The atrial septum is grossly normal.  LEFT VENTRICLE PLAX 2D LVIDd:         4.40 cm   Diastology LVIDs:         3.00 cm   LV e' medial:    6.74 cm/s LV PW:         1.30 cm   LV E/e' medial:  15.3 LV IVS:        0.90 cm   LV e' lateral:   7.94 cm/s LVOT diam:     1.60 cm   LV E/e' lateral: 13.0 LV SV:         41 LV SV  Index:   26 LVOT Area:     2.01 cm  RIGHT VENTRICLE             IVC RV Basal diam:  3.50 cm     IVC diam: 1.80 cm RV S prime:     11.67 cm/s TAPSE (M-mode): 2.3 cm LEFT ATRIUM             Index        RIGHT ATRIUM           Index LA diam:        4.10 cm 2.59 cm/m   RA Area:     12.30 cm LA Vol (A2C):   55.8 ml 35.24 ml/m  RA Volume:   29.60 ml  18.69 ml/m LA Vol (A4C):   46.6 ml 29.43 ml/m LA Biplane Vol: 52.8 ml 33.34 ml/m  AORTIC VALVE LVOT Vmax:   88.55 cm/s LVOT Vmean:  57.450 cm/s LVOT VTI:    0.205 m  AORTA Ao Root diam: 2.40 cm MITRAL VALVE                TRICUSPID VALVE MV Area (PHT): 3.08 cm     TR Peak grad:   35.0 mmHg MV Decel Time: 246 msec     TR Vmax:        296.00 cm/s MV E velocity: 103.00 cm/s MV A velocity: 90.30 cm/s   SHUNTS MV E/A ratio:  1.14         Systemic VTI:  0.20 m                             Systemic Diam: 1.60 cm Keller Paterson Electronically signed by Keller Paterson Signature Date/Time: 08/29/2024/8:51:40 AM    Final    DG Hand 2 View Left Result Date: 08/28/2024 EXAM: 1 OR 2 VIEW(S) XRAY OF THE LEFT HAND 08/28/2024 02:58:00 PM COMPARISON: None available. CLINICAL HISTORY: FINDINGS: BONES AND JOINTS: No acute fracture. No malalignment. Flexion of the second through fifth metacarpophalangeal joints. SOFT TISSUES: Diffuse soft tissue swelling of the hand and forearm. IMPRESSION: 1. Diffuse soft tissue swelling of the hand and forearm. 2. Flexion of the second through fifth metacarpophalangeal joints. Electronically signed by: Elsie Gravely MD 08/28/2024 03:06 PM EST RP Workstation: [id number]   DG Chest Portable 1 View Result Date: 08/28/2024 EXAM: 1 VIEW(S) XRAY OF THE CHEST 08/28/2024 02:05:00 PM COMPARISON: 07/30/2024 CLINICAL HISTORY: Anasarca. FINDINGS: LUNGS AND PLEURA: Mild bibasilar opacities. Bilateral trace pleural effusion. No pneumothorax. HEART AND MEDIASTINUM: Cardiomegaly. Aortic calcification. BONES AND SOFT TISSUES: No acute osseous abnormality.  IMPRESSION: 1. Bilateral trace pleural effusions with mild bibasilar opacities. 2. Cardiomegaly. Electronically signed by: Elsie Gravely MD 08/28/2024 03:05 PM EST RP Workstation: [id number]        Scheduled Meds:  amLODipine   10 mg Oral Daily   aspirin  EC  81 mg Oral Daily   atorvastatin   40 mg Oral Daily   budesonide -glycopyrrolate -formoterol   2 puff Inhalation BID   cloNIDine   0.1 mg Oral TID   heparin   5,000 Units Subcutaneous Q8H   insulin  aspart  0-9 Units Subcutaneous TID WC   isosorbide  mononitrate  30 mg Oral QHS   losartan   50 mg Oral Daily   paliperidone   3 mg Oral Daily   pantoprazole   40 mg Oral Daily   Continuous Infusions:   LOS: 1 day     Devaughn KATHEE Ban, MD [facility]   If 7PM-7AM,  please contact night-coverage [email or web address] Password [facility] 08/29/2024, 11:35 AM     "

## 2024-08-29 NOTE — Progress Notes (Signed)
 " [facility]  ROUNDING NOTE   Subjective:   Alicia Rogers is a 64 y.o. female with past medical history of anemia, depression, diabetes, hypertension, paranoid schizophrenia and end stage renal disease on hemodialysis. Patient was brought to emergency department with her sister due to concerns of missed dialysis.  Also voices concerns of mental instability.  Patient is currently admitted for Anasarca [R60.1] Delusions (HCC) [F22] ESRD on dialysis (HCC) [N18.6, Z99.2] Non compliance w medication regimen [Z91.148]  Patient is known to our practice and receives outpatient dialysis treatments at [facility] on a Tuesday and Saturday schedule.  Last treatment received on 1/20.  Patient seen resting in bed.  Currently on room air, denies shortness of breath.  Patient reports loss of appetite.  States she has not felt like going to dialysis however she does not want to discontinue dialysis services.  She does refuse a dialysis treatment today.  Labs on ED arrival concerning for potassium 3.3, BUN 37, creatinine 3.19 with GFR of 15, BNP greater than 9800.  UDS negative.  Chest x-ray shows bilateral trace pleural effusions with mild bibasilar opacities.  We have been consulted to manage dialysis needs.  Objective:  Vital signs in last 24 hours:  Temp:  [97.5 F (36.4 C)-98.6 F (37 C)] 97.9 F (36.6 C) (01/28 1219) Pulse Rate:  [48-71] 57 (01/28 1219) Resp:  [16-20] 20 (01/28 1219) BP: (102-197)/(47-73) 124/53 (01/28 1219) SpO2:  [98 %-100 %] 100 % (01/28 1219)  Weight change:  There were no vitals filed for this visit.  Intake/Output: I/O last 3 completed shifts: In: 480 [P.O.:480] Out: -    Intake/Output this shift:  Total I/O In: 480 [P.O.:480] Out: -   Physical Exam: General: NAD  Head: Normocephalic, atraumatic. Moist oral mucosal membranes  Eyes: Anicteric  Lungs:  Clear to auscultation, room air, normal effort  Heart: Regular rate and rhythm   Abdomen:  Soft, nontender  Extremities: No peripheral edema.  Neurologic: Awake, alert, conversant  Skin: Warm,dry, no rash  Access: Left upper AVF    Basic Metabolic Panel: Recent Labs  Lab 08/28/24 1314 08/29/24 1129  NA 145 142  K 3.3* 3.1*  CL 112* 112*  CO2 21* 19*  GLUCOSE 149* 174*  BUN 37* 41*  CREATININE 3.19* 3.35*  CALCIUM  9.2 8.2*    Liver Function Tests: Recent Labs  Lab 08/28/24 1314  AST 15  ALT 8  ALKPHOS 156*  BILITOT 0.3  PROT 6.8  ALBUMIN 3.1*   No results for input(s): LIPASE, AMYLASE in the last 168 hours. No results for input(s): AMMONIA in the last 168 hours.  CBC: Recent Labs  Lab 08/28/24 1314  WBC 6.5  NEUTROABS 4.4  HGB 12.0  HCT 40.5  MCV 92.5  PLT 382    Cardiac Enzymes: No results for input(s): CKTOTAL, CKMB, CKMBINDEX, TROPONINI in the last 168 hours.  BNP: Invalid input(s): POCBNP  CBG: Recent Labs  Lab 08/28/24 1251 08/28/24 2107 08/29/24 0712 08/29/24 1211  GLUCAP 144* 189* 125* 168*    Microbiology: Results for orders placed or performed during the hospital encounter of 07/29/24  Resp panel by RT-PCR (RSV, Flu A&B, Covid) Anterior Nasal Swab     Status: None   Collection Time: 07/29/24 11:58 PM   Specimen: Anterior Nasal Swab  Result Value Ref Range Status   SARS Coronavirus 2 by RT PCR NEGATIVE NEGATIVE Final    Comment: (NOTE) SARS-CoV-2 target nucleic acids are NOT DETECTED.  The SARS-CoV-2  RNA is generally detectable in upper respiratory specimens during the acute phase of infection. The lowest concentration of SARS-CoV-2 viral copies this assay can detect is 138 copies/mL. A negative result does not preclude SARS-Cov-2 infection and should not be used as the sole basis for treatment or other patient management decisions. A negative result may occur with  improper specimen collection/handling, submission of specimen other than nasopharyngeal swab, presence of viral mutation(s)  within the areas targeted by this assay, and inadequate number of viral copies(<138 copies/mL). A negative result must be combined with clinical observations, patient history, and epidemiological information. The expected result is Negative.  Fact Sheet for Patients:  bloggercourse.com  Fact Sheet for Healthcare Providers:  seriousbroker.it  This test is no t yet approved or cleared by the [place]  FDA and  has been authorized for detection and/or diagnosis of SARS-CoV-2 by FDA under an Emergency Use Authorization (EUA). This EUA will remain  in effect (meaning this test can be used) for the duration of the COVID-19 declaration under Section 564(b)(1) of the Act, 21 U.S.C.section 360bbb-3(b)(1), unless the authorization is terminated  or revoked sooner.       Influenza A by PCR NEGATIVE NEGATIVE Final   Influenza B by PCR NEGATIVE NEGATIVE Final    Comment: (NOTE) The Xpert Xpress SARS-CoV-2/FLU/RSV plus assay is intended as an aid in the diagnosis of influenza from Nasopharyngeal swab specimens and should not be used as a sole basis for treatment. Nasal washings and aspirates are unacceptable for Xpert Xpress SARS-CoV-2/FLU/RSV testing.  Fact Sheet for Patients: bloggercourse.com  Fact Sheet for Healthcare Providers: seriousbroker.it  This test is not yet approved or cleared by the [place]  FDA and has been authorized for detection and/or diagnosis of SARS-CoV-2 by FDA under an Emergency Use Authorization (EUA). This EUA will remain in effect (meaning this test can be used) for the duration of the COVID-19 declaration under Section 564(b)(1) of the Act, 21 U.S.C. section 360bbb-3(b)(1), unless the authorization is terminated or revoked.     Resp Syncytial Virus by PCR NEGATIVE NEGATIVE Final    Comment: (NOTE) Fact Sheet for  Patients: bloggercourse.com  Fact Sheet for Healthcare Providers: seriousbroker.it  This test is not yet approved or cleared by the [place]  FDA and has been authorized for detection and/or diagnosis of SARS-CoV-2 by FDA under an Emergency Use Authorization (EUA). This EUA will remain in effect (meaning this test can be used) for the duration of the COVID-19 declaration under Section 564(b)(1) of the Act, 21 U.S.C. section 360bbb-3(b)(1), unless the authorization is terminated or revoked.  Performed at [facility], [address] [place], [place] [address]     Coagulation Studies: No results for input(s): LABPROT, INR in the last 72 hours.  Urinalysis: Recent Labs    08/28/24 1537  COLORURINE YELLOW*  LABSPEC 1.024  PHURINE 5.0  GLUCOSEU 150*  HGBUR SMALL*  BILIRUBINUR NEGATIVE  KETONESUR NEGATIVE  PROTEINUR >=300*  NITRITE NEGATIVE  LEUKOCYTESUR NEGATIVE      Imaging: ECHOCARDIOGRAM COMPLETE Result Date: 08/29/2024    ECHOCARDIOGRAM REPORT   Patient Name:   SHASTA CHINN Date of Exam: 08/28/2024 Medical Rec #:  [id number]          Height:       63.0 in Accession #:    [id number]         Weight:       125.0 lb Date of Birth:  February 20, 1961  BSA:          1.584 m Patient Age:    63 years           BP:           197/65 mmHg Patient Gender: F                  HR:           55 bpm. Exam Location:  [facility] Procedure: 2D Echo, Cardiac Doppler and Color Doppler (Both Spectral and Color            Flow Doppler were utilized during procedure). Indications:     I51.7 Cardiomegaly  History:         Patient has prior history of Echocardiogram examinations, most                  recent 05/10/2023. Risk Factors:Hypertension and Diabetes.  Sonographer:     Carl Coma RDCS Referring Phys:  [id number] LANETA BLUNT Diagnosing Phys: Keller Alluri IMPRESSIONS  1. Left ventricular ejection fraction, by  estimation, is 55 to 60%. The left ventricle has normal function. The left ventricle has no regional wall motion abnormalities. There is mild left ventricular hypertrophy. Left ventricular diastolic parameters are consistent with Grade I diastolic dysfunction (impaired relaxation).  2. Right ventricular systolic function is normal. The right ventricular size is normal. There is mildly elevated pulmonary artery systolic pressure.  3. Left atrial size was mildly dilated.  4. The mitral valve is normal in structure. Trivial mitral valve regurgitation.  5. The aortic valve is tricuspid. Aortic valve regurgitation is not visualized.  6. The inferior vena cava is normal in size with <50% respiratory variability, suggesting right atrial pressure of 8 mmHg. FINDINGS  Left Ventricle: Left ventricular ejection fraction, by estimation, is 55 to 60%. The left ventricle has normal function. The left ventricle has no regional wall motion abnormalities. The left ventricular internal cavity size was normal in size. There is  mild left ventricular hypertrophy. Left ventricular diastolic parameters are consistent with Grade I diastolic dysfunction (impaired relaxation). Right Ventricle: The right ventricular size is normal. No increase in right ventricular wall thickness. Right ventricular systolic function is normal. There is mildly elevated pulmonary artery systolic pressure. The tricuspid regurgitant velocity is 2.96  m/s, and with an assumed right atrial pressure of 8 mmHg, the estimated right ventricular systolic pressure is 43.0 mmHg. Left Atrium: Left atrial size was mildly dilated. Right Atrium: Right atrial size was normal in size. Pericardium: There is no evidence of pericardial effusion. Mitral Valve: The mitral valve is normal in structure. Trivial mitral valve regurgitation. Tricuspid Valve: The tricuspid valve is normal in structure. Tricuspid valve regurgitation is mild. Aortic Valve: The aortic valve is tricuspid.  Aortic valve regurgitation is not visualized. Pulmonic Valve: The pulmonic valve was not well visualized. Pulmonic valve regurgitation is not visualized. Aorta: The aortic root is normal in size and structure. Venous: The inferior vena cava is normal in size with less than 50% respiratory variability, suggesting right atrial pressure of 8 mmHg. IAS/Shunts: The atrial septum is grossly normal.  LEFT VENTRICLE PLAX 2D LVIDd:         4.40 cm   Diastology LVIDs:         3.00 cm   LV e' medial:    6.74 cm/s LV PW:         1.30 cm   LV E/e' medial:  15.3 LV IVS:  0.90 cm   LV e' lateral:   7.94 cm/s LVOT diam:     1.60 cm   LV E/e' lateral: 13.0 LV SV:         41 LV SV Index:   26 LVOT Area:     2.01 cm  RIGHT VENTRICLE             IVC RV Basal diam:  3.50 cm     IVC diam: 1.80 cm RV S prime:     11.67 cm/s TAPSE (M-mode): 2.3 cm LEFT ATRIUM             Index        RIGHT ATRIUM           Index LA diam:        4.10 cm 2.59 cm/m   RA Area:     12.30 cm LA Vol (A2C):   55.8 ml 35.24 ml/m  RA Volume:   29.60 ml  18.69 ml/m LA Vol (A4C):   46.6 ml 29.43 ml/m LA Biplane Vol: 52.8 ml 33.34 ml/m  AORTIC VALVE LVOT Vmax:   88.55 cm/s LVOT Vmean:  57.450 cm/s LVOT VTI:    0.205 m  AORTA Ao Root diam: 2.40 cm MITRAL VALVE                TRICUSPID VALVE MV Area (PHT): 3.08 cm     TR Peak grad:   35.0 mmHg MV Decel Time: 246 msec     TR Vmax:        296.00 cm/s MV E velocity: 103.00 cm/s MV A velocity: 90.30 cm/s   SHUNTS MV E/A ratio:  1.14         Systemic VTI:  0.20 m                             Systemic Diam: 1.60 cm Keller Paterson Electronically signed by Keller Paterson Signature Date/Time: 08/29/2024/8:51:40 AM    Final    DG Hand 2 View Left Result Date: 08/28/2024 EXAM: 1 OR 2 VIEW(S) XRAY OF THE LEFT HAND 08/28/2024 02:58:00 PM COMPARISON: None available. CLINICAL HISTORY: FINDINGS: BONES AND JOINTS: No acute fracture. No malalignment. Flexion of the second through fifth metacarpophalangeal joints. SOFT  TISSUES: Diffuse soft tissue swelling of the hand and forearm. IMPRESSION: 1. Diffuse soft tissue swelling of the hand and forearm. 2. Flexion of the second through fifth metacarpophalangeal joints. Electronically signed by: Elsie Gravely MD 08/28/2024 03:06 PM EST RP Workstation: [id number]   DG Chest Portable 1 View Result Date: 08/28/2024 EXAM: 1 VIEW(S) XRAY OF THE CHEST 08/28/2024 02:05:00 PM COMPARISON: 07/30/2024 CLINICAL HISTORY: Anasarca. FINDINGS: LUNGS AND PLEURA: Mild bibasilar opacities. Bilateral trace pleural effusion. No pneumothorax. HEART AND MEDIASTINUM: Cardiomegaly. Aortic calcification. BONES AND SOFT TISSUES: No acute osseous abnormality. IMPRESSION: 1. Bilateral trace pleural effusions with mild bibasilar opacities. 2. Cardiomegaly. Electronically signed by: Elsie Gravely MD 08/28/2024 03:05 PM EST RP Workstation: [id number]     Medications:     allopurinol   100 mg Oral Daily   amLODipine   10 mg Oral Daily   aspirin  EC  81 mg Oral Daily   atorvastatin   40 mg Oral Daily   budesonide -glycopyrrolate -formoterol   2 puff Inhalation BID   cloNIDine   0.1 mg Oral TID   furosemide   80 mg Intravenous Once   heparin   5,000 Units Subcutaneous Q8H   insulin  aspart  0-9 Units Subcutaneous TID WC   isosorbide   mononitrate  30 mg Oral QHS   losartan   50 mg Oral Daily   paliperidone   3 mg Oral Daily   pantoprazole   40 mg Oral Daily   hydrALAZINE , ipratropium-albuterol , midazolam  PF  Assessment/ Plan:  Alicia Rogers is a 64 y.o.  female  with past medical history of anemia, depression, diabetes, hypertension, paranoid schizophrenia and end stage renal disease on hemodialysis. Patient was brought to emergency department due to lack of dialysis and mentation.  [facility]/T-S/Lt AVF   End-stage renal disease on hemodialysis.  Last treatment completed on 1/20.  Patient offered dialysis today however refused.  States she does not want to continue receiving  dialysis, just not today.  Agree with goals of care discussion.  Will offer dialysis treatment again tomorrow.  2.  Hypertension with chronic kidney disease.  Home regimen includes amlodipine , clonidine , and losartan .  Currently receiving amlodipine , clonidine , furosemide , isosorbide , and losartan .  3. Anemia of chronic kidney disease Lab Results  Component Value Date   HGB 12.0 08/28/2024    Hemoglobin acceptable for renal patient.  4. Secondary Hyperparathyroidism: with outpatient labs: PTH 488, phosphorus 3.7, calcium  8.8 on 08/15/23.   Lab Results  Component Value Date   CALCIUM  8.2 (L) 08/29/2024   CAION 1.29 07/14/2023   PHOS 4.9 (H) 05/28/2024    Will continue to monitor bone minerals   LOS: 1 Lamar Naef 1/28/20262:28 PM   "

## 2024-08-30 DIAGNOSIS — R601 Generalized edema: Secondary | ICD-10-CM | POA: Diagnosis not present

## 2024-08-30 LAB — GLUCOSE, CAPILLARY
Glucose-Capillary: 97 mg/dL (ref 70–99)
Glucose-Capillary: 106 mg/dL — ABNORMAL HIGH (ref 70–99)
Glucose-Capillary: 175 mg/dL — ABNORMAL HIGH (ref 70–99)
Glucose-Capillary: 104 mg/dL — ABNORMAL HIGH (ref 70–99)
Glucose-Capillary: 138 mg/dL — ABNORMAL HIGH (ref 70–99)

## 2024-08-30 LAB — BASIC METABOLIC PANEL WITH GFR
Anion gap: 12 (ref 5–15)
BUN: 43 mg/dL — ABNORMAL HIGH (ref 8–23)
CO2: 19 mmol/L — ABNORMAL LOW (ref 22–32)
Calcium: 8.1 mg/dL — ABNORMAL LOW (ref 8.9–10.3)
Chloride: 114 mmol/L — ABNORMAL HIGH (ref 98–111)
Creatinine, Ser: 3.65 mg/dL — ABNORMAL HIGH (ref 0.44–1.00)
GFR, Estimated: 13 mL/min — ABNORMAL LOW
Glucose, Bld: 143 mg/dL — ABNORMAL HIGH (ref 70–99)
Potassium: 3.5 mmol/L (ref 3.5–5.1)
Sodium: 145 mmol/L (ref 135–145)

## 2024-08-30 LAB — MISC LABCORP TEST (SEND OUT): Labcorp test code: 83935

## 2024-08-30 MED ORDER — SALINE SPRAY 0.65 % NA SOLN
1.0000 | NASAL | Status: DC | PRN
  Administered 2024-08-30: 1 via NASAL
  Filled 2024-08-30: qty 44

## 2024-08-30 MED ORDER — DIPHENHYDRAMINE HCL 25 MG PO CAPS
25.0000 mg | ORAL_CAPSULE | Freq: Once | ORAL | Status: AC
  Administered 2024-08-30: 25 mg via ORAL
  Filled 2024-08-30: qty 1

## 2024-08-30 NOTE — Progress Notes (Signed)
 Pt requesting that I call her sister to have her bring her clothes so she can change out of the hospital gown and scrubs. I advised this is what she had to wear at this time. Pt asking to speak with social worker, sent secure chart to assigned case manager informing of this request. Pt also states I am not going to dialysis today, not here. MD [facility] informed.

## 2024-08-30 NOTE — Progress Notes (Signed)
 " PROGRESS NOTE    Alicia Rogers  FMW:[id number] DOB: 1961/05/24 DOA: 08/28/2024 PCP: Austin Candyce Stall, MD     Brief Narrative:    Spoke w/ patient at bedside in ED. She has gotten dose Versed  in the ED to allow staff to place an IV. She is conversational but speech is slow / slurred. She can tell me she is at the hospital for breathing problems. She states she doesn't want dialysis just dont want to despite education that refusing dialysis will result in eventual death.   Spoke w/ sister Pleasant Britz who gave some more information: She states things started this time around Oct 2025 - patient was kicked out of [facility]. She was living w/ her sister [name]. [name] states that [name] was given a list of her medications / prescriptions. Rx unable to be filled at first until patient went to primary care few weeks ago, they rx some meds for her to [facility]. [name] states that now Ocie wont take the medications. Chyna is giving a lot of different reasons for not going to dialysis, ranging from thery're talking bad about me there to just not wanting to go. [name] also states that Shani was getting a monthly injection for schizophrenia at [facility] - last time she got the shot was probably 05/2024. [name] is working w/ DSS to arrange guardianship whether guardian is family or the state. Only other concern at this time is the shortness of breath worsening over past few days and also concern about feet and hands - nails are long, she has not been letting her sisters cut her nails. [name] states that she has not been letting us  do much to help her and I had to raise my voice to even get her to come to the ER and I hate doing that but she won't listen. Last dialysis was 08/11/24.    Assessment & Plan:   Principal Problem:   Anasarca Active Problems:   COPD (chronic obstructive pulmonary disease) (HCC)   Essential hypertension   Schizoaffective disorder, bipolar type (HCC)   Diabetes  mellitus, type 2 (HCC)   Paranoid schizophrenia (HCC)   ESRD on dialysis (HCC)  # ESRD Intermittently refusing dialysis. Nephrology advises against sedating/restraining for this. Working to get mental illness under better control. Refused dialysis today, renal parameters OK - nephrology following - palliative following  # Schizoaffective disorder Mental illness significant contributor to underlying medical problems - BH following, have re-started invega  - medically stable for inpatient psych, pending bed availability  # HTN Controlled - continue home amlodipine , clonidine , losartan   # T2DM Glucose controlled - ssi  # COPD No exacerbation - home breztri   # CAD No chest pain - home asa, statin, losartan , imdur   # Gout - home allopurinol    DVT prophylaxis: heparin  Code Status: full Family Communication: sister aki updated telephonically 1/28  Level of care: Med-Surg Status is: Inpatient Remains inpatient appropriate because: dispo planning underway, needs inpatient psych    Consultants:  Nephrology Behavioral health Palliative  Procedures: None thus far  Antimicrobials:  none    Subjective: Reports feeling fine, tolerating diet, BM today  Objective: Vitals:   08/30/24 0500 08/30/24 0522 08/30/24 0732 08/30/24 1452  BP:  (!) 118/59 (!) 163/60 134/61  Pulse:  62 (!) 56 60  Resp:  17 15 16   Temp:  97.6 F (36.4 C) 99.1 F (37.3 C) 97.7 F (36.5 C)  TempSrc:  Oral Oral   SpO2:  100% 100%  100%  Weight: 58 kg       Intake/Output Summary (Last 24 hours) at 08/30/2024 1633 Last data filed at 08/30/2024 9072 Gross per 24 hour  Intake 480 ml  Output --  Net 480 ml   Filed Weights   08/30/24 0500  Weight: 58 kg    Examination:  General exam: Appears calm and comfortable, chronically ill  Respiratory system: Clear to auscultation. Respiratory effort normal. Cardiovascular system: S1 & S2 heard, RRR. Gastrointestinal system: Abdomen is  nondistended, soft and nontender.   Central nervous system: Alert, oriented to self Extremities: Symmetric 5 x 5 power. Skin: No rashes, lesions or ulcers Psychiatry: calm, guarded     Data Reviewed: I have personally reviewed following labs and imaging studies  CBC: Recent Labs  Lab 08/28/24 1314  WBC 6.5  NEUTROABS 4.4  HGB 12.0  HCT 40.5  MCV 92.5  PLT 382   Basic Metabolic Panel: Recent Labs  Lab 08/28/24 1314 08/29/24 1129 08/30/24 0524  NA 145 142 145  K 3.3* 3.1* 3.5  CL 112* 112* 114*  CO2 21* 19* 19*  GLUCOSE 149* 174* 143*  BUN 37* 41* 43*  CREATININE 3.19* 3.35* 3.65*  CALCIUM  9.2 8.2* 8.1*   GFR: Estimated Creatinine Clearance: 12.9 mL/min (A) (by C-G formula based on SCr of 3.65 mg/dL (H)). Liver Function Tests: Recent Labs  Lab 08/28/24 1314  AST 15  ALT 8  ALKPHOS 156*  BILITOT 0.3  PROT 6.8  ALBUMIN 3.1*   No results for input(s): LIPASE, AMYLASE in the last 168 hours. No results for input(s): AMMONIA in the last 168 hours. Coagulation Profile: No results for input(s): INR, PROTIME in the last 168 hours. Cardiac Enzymes: No results for input(s): CKTOTAL, CKMB, CKMBINDEX, TROPONINI in the last 168 hours. BNP (last 3 results) Recent Labs    07/30/24 0028 08/28/24 1314  PROBNP 8,261.0* 9,877.0*   HbA1C: Recent Labs    08/28/24 1314  HGBA1C 7.2*   CBG: Recent Labs  Lab 08/29/24 1211 08/29/24 1620 08/29/24 2113 08/30/24 0728 08/30/24 1137  GLUCAP 168* 160* 192* 106* 175*   Lipid Profile: No results for input(s): CHOL, HDL, LDLCALC, TRIG, CHOLHDL, LDLDIRECT in the last 72 hours. Thyroid  Function Tests: Recent Labs    08/28/24 1314  TSH 1.860   Anemia Panel: No results for input(s): VITAMINB12, FOLATE, FERRITIN, TIBC, IRON , RETICCTPCT in the last 72 hours. Urine analysis:    Component Value Date/Time   COLORURINE YELLOW (A) 08/28/2024 1537   APPEARANCEUR HAZY (A) 08/28/2024  1537   APPEARANCEUR Cloudy (A) 03/11/2020 1629   LABSPEC 1.024 08/28/2024 1537   PHURINE 5.0 08/28/2024 1537   GLUCOSEU 150 (A) 08/28/2024 1537   HGBUR SMALL (A) 08/28/2024 1537   BILIRUBINUR NEGATIVE 08/28/2024 1537   BILIRUBINUR Negative 03/11/2020 1629   KETONESUR NEGATIVE 08/28/2024 1537   PROTEINUR >=300 (A) 08/28/2024 1537   NITRITE NEGATIVE 08/28/2024 1537   LEUKOCYTESUR NEGATIVE 08/28/2024 1537   Sepsis Labs: @LABRCNTIP (procalcitonin:4,lacticidven:4)  ) Recent Results (from the past 240 hours)  Blood culture (routine x 2)     Status: None (Preliminary result)   Collection Time: 08/28/24  3:37 PM   Specimen: BLOOD  Result Value Ref Range Status   Specimen Description BLOOD RIGHT ANTECUBITAL  Final   Special Requests   Final    BOTTLES DRAWN AEROBIC AND ANAEROBIC Blood Culture results may not be optimal due to an inadequate volume of blood received in culture bottles   Culture   Final  NO GROWTH 2 DAYS Performed at [facility], [address] [place], [place] [address]    Report Status PENDING  Incomplete  Blood culture (routine x 2)     Status: None (Preliminary result)   Collection Time: 08/28/24  3:37 PM   Specimen: BLOOD  Result Value Ref Range Status   Specimen Description BLOOD BLOOD RIGHT FOREARM  Final   Special Requests   Final    BOTTLES DRAWN AEROBIC AND ANAEROBIC Blood Culture results may not be optimal due to an inadequate volume of blood received in culture bottles   Culture   Final    NO GROWTH 2 DAYS Performed at [facility], [address], [place], [place] [address]    Report Status PENDING  Incomplete         Radiology Studies: ECHOCARDIOGRAM COMPLETE Result Date: 08/29/2024    ECHOCARDIOGRAM REPORT   Patient Name:   ISELA STANTZ Date of Exam: 08/28/2024 Medical Rec #:  [id number]          Height:       63.0 in Accession #:    [id number]         Weight:       125.0 lb Date of Birth:  [birth year]           BSA:           1.584 m Patient Age:    63 years           BP:           197/65 mmHg Patient Gender: F                  HR:           55 bpm. Exam Location:  [facility] Procedure: 2D Echo, Cardiac Doppler and Color Doppler (Both Spectral and Color            Flow Doppler were utilized during procedure). Indications:     I51.7 Cardiomegaly  History:         Patient has prior history of Echocardiogram examinations, most                  recent 05/10/2023. Risk Factors:Hypertension and Diabetes.  Sonographer:     Carl Coma RDCS Referring Phys:  [id number] LANETA BLUNT Diagnosing Phys: Keller Alluri IMPRESSIONS  1. Left ventricular ejection fraction, by estimation, is 55 to 60%. The left ventricle has normal function. The left ventricle has no regional wall motion abnormalities. There is mild left ventricular hypertrophy. Left ventricular diastolic parameters are consistent with Grade I diastolic dysfunction (impaired relaxation).  2. Right ventricular systolic function is normal. The right ventricular size is normal. There is mildly elevated pulmonary artery systolic pressure.  3. Left atrial size was mildly dilated.  4. The mitral valve is normal in structure. Trivial mitral valve regurgitation.  5. The aortic valve is tricuspid. Aortic valve regurgitation is not visualized.  6. The inferior vena cava is normal in size with <50% respiratory variability, suggesting right atrial pressure of 8 mmHg. FINDINGS  Left Ventricle: Left ventricular ejection fraction, by estimation, is 55 to 60%. The left ventricle has normal function. The left ventricle has no regional wall motion abnormalities. The left ventricular internal cavity size was normal in size. There is  mild left ventricular hypertrophy. Left ventricular diastolic parameters are consistent with Grade I diastolic dysfunction (impaired relaxation). Right Ventricle: The right ventricular size is normal. No increase in right ventricular wall thickness.  Right ventricular  systolic function is normal. There is mildly elevated pulmonary artery systolic pressure. The tricuspid regurgitant velocity is 2.96  m/s, and with an assumed right atrial pressure of 8 mmHg, the estimated right ventricular systolic pressure is 43.0 mmHg. Left Atrium: Left atrial size was mildly dilated. Right Atrium: Right atrial size was normal in size. Pericardium: There is no evidence of pericardial effusion. Mitral Valve: The mitral valve is normal in structure. Trivial mitral valve regurgitation. Tricuspid Valve: The tricuspid valve is normal in structure. Tricuspid valve regurgitation is mild. Aortic Valve: The aortic valve is tricuspid. Aortic valve regurgitation is not visualized. Pulmonic Valve: The pulmonic valve was not well visualized. Pulmonic valve regurgitation is not visualized. Aorta: The aortic root is normal in size and structure. Venous: The inferior vena cava is normal in size with less than 50% respiratory variability, suggesting right atrial pressure of 8 mmHg. IAS/Shunts: The atrial septum is grossly normal.  LEFT VENTRICLE PLAX 2D LVIDd:         4.40 cm   Diastology LVIDs:         3.00 cm   LV e' medial:    6.74 cm/s LV PW:         1.30 cm   LV E/e' medial:  15.3 LV IVS:        0.90 cm   LV e' lateral:   7.94 cm/s LVOT diam:     1.60 cm   LV E/e' lateral: 13.0 LV SV:         41 LV SV Index:   26 LVOT Area:     2.01 cm  RIGHT VENTRICLE             IVC RV Basal diam:  3.50 cm     IVC diam: 1.80 cm RV S prime:     11.67 cm/s TAPSE (M-mode): 2.3 cm LEFT ATRIUM             Index        RIGHT ATRIUM           Index LA diam:        4.10 cm 2.59 cm/m   RA Area:     12.30 cm LA Vol (A2C):   55.8 ml 35.24 ml/m  RA Volume:   29.60 ml  18.69 ml/m LA Vol (A4C):   46.6 ml 29.43 ml/m LA Biplane Vol: 52.8 ml 33.34 ml/m  AORTIC VALVE LVOT Vmax:   88.55 cm/s LVOT Vmean:  57.450 cm/s LVOT VTI:    0.205 m  AORTA Ao Root diam: 2.40 cm MITRAL VALVE                TRICUSPID VALVE MV Area (PHT): 3.08 cm      TR Peak grad:   35.0 mmHg MV Decel Time: 246 msec     TR Vmax:        296.00 cm/s MV E velocity: 103.00 cm/s MV A velocity: 90.30 cm/s   SHUNTS MV E/A ratio:  1.14         Systemic VTI:  0.20 m                             Systemic Diam: 1.60 cm Keller Paterson Electronically signed by Keller Paterson Signature Date/Time: 08/29/2024/8:51:40 AM    Final         Scheduled Meds:  allopurinol   100 mg Oral Daily   amLODipine   10 mg  Oral Daily   aspirin  EC  81 mg Oral Daily   atorvastatin   40 mg Oral Daily   budesonide -glycopyrrolate -formoterol   2 puff Inhalation BID   cloNIDine   0.1 mg Oral TID   furosemide   80 mg Intravenous Once   heparin   5,000 Units Subcutaneous Q8H   insulin  aspart  0-9 Units Subcutaneous TID WC   isosorbide  mononitrate  30 mg Oral QHS   losartan   50 mg Oral Daily   paliperidone   3 mg Oral Daily   pantoprazole   40 mg Oral Daily   Continuous Infusions:   LOS: 2 days     Devaughn KATHEE Ban, MD [facility]   If 7PM-7AM, please contact night-coverage [email or web address] Password [facility] 08/30/2024, 4:33 PM     "

## 2024-08-30 NOTE — Plan of Care (Signed)
  Problem: Education: Goal: Ability to describe self-care measures that may prevent or decrease complications (Diabetes Survival Skills Education) will improve Outcome: Progressing Goal: Individualized Educational Video(s) Outcome: Progressing   Problem: Coping: Goal: Ability to adjust to condition or change in health will improve Outcome: Progressing   Problem: Fluid Volume: Goal: Ability to maintain a balanced intake and output will improve Outcome: Progressing   Problem: Health Behavior/Discharge Planning: Goal: Ability to identify and utilize available resources and services will improve Outcome: Progressing Goal: Ability to manage health-related needs will improve Outcome: Progressing   Problem: Metabolic: Goal: Ability to maintain appropriate glucose levels will improve Outcome: Progressing   Problem: Nutritional: Goal: Maintenance of adequate nutrition will improve Outcome: Progressing Goal: Progress toward achieving an optimal weight will improve Outcome: Progressing   Problem: Skin Integrity: Goal: Risk for impaired skin integrity will decrease Outcome: Progressing   Problem: Tissue Perfusion: Goal: Adequacy of tissue perfusion will improve Outcome: Progressing   Problem: Education: Goal: Knowledge of General Education information will improve Description: Including pain rating scale, medication(s)/side effects and non-pharmacologic comfort measures Outcome: Progressing   Problem: Health Behavior/Discharge Planning: Goal: Ability to manage health-related needs will improve Outcome: Progressing   Problem: Clinical Measurements: Goal: Ability to maintain clinical measurements within normal limits will improve Outcome: Progressing Goal: Will remain free from infection Outcome: Progressing Goal: Diagnostic test results will improve Outcome: Progressing Goal: Respiratory complications will improve Outcome: Progressing Goal: Cardiovascular complication will  be avoided Outcome: Progressing   Problem: Activity: Goal: Risk for activity intolerance will decrease Outcome: Progressing   Problem: Nutrition: Goal: Adequate nutrition will be maintained Outcome: Progressing   Problem: Coping: Goal: Level of anxiety will decrease Outcome: Progressing   Problem: Elimination: Goal: Will not experience complications related to bowel motility Outcome: Progressing Goal: Will not experience complications related to urinary retention Outcome: Progressing   Problem: Safety: Goal: Ability to remain free from injury will improve Outcome: Progressing   Problem: Pain Managment: Goal: General experience of comfort will improve and/or be controlled Outcome: Progressing   Problem: Skin Integrity: Goal: Risk for impaired skin integrity will decrease Outcome: Progressing

## 2024-08-30 NOTE — Progress Notes (Signed)
 " [facility]  ROUNDING NOTE   Subjective:   Alicia Rogers is a 64 y.o. female with past medical history of anemia, depression, diabetes, hypertension, paranoid schizophrenia and end stage renal disease on hemodialysis. Patient was brought to emergency department with her sister due to concerns of missed dialysis.  Also voices concerns of mental instability.  Patient is currently admitted for Anasarca [R60.1] Delusions (HCC) [F22] ESRD on dialysis (HCC) [N18.6, Z99.2] Non compliance w medication regimen [Z91.148]  Patient is known to our practice and receives outpatient dialysis treatments at [facility] on a Tuesday and Saturday schedule.    Update: Patient seen laying in bed Eating breakfast Room air   Objective:  Vital signs in last 24 hours:  Temp:  [97.4 F (36.3 C)-99.1 F (37.3 C)] 99.1 F (37.3 C) (01/29 0732) Pulse Rate:  [56-66] 56 (01/29 0732) Resp:  [15-19] 15 (01/29 0732) BP: (118-163)/(52-61) 163/60 (01/29 0732) SpO2:  [100 %] 100 % (01/29 0732) Weight:  [58 kg] 58 kg (01/29 0500)  Weight change:  Filed Weights   08/30/24 0500  Weight: 58 kg    Intake/Output: I/O last 3 completed shifts: In: 960 [P.O.:960] Out: -    Intake/Output this shift:  Total I/O In: 480 [P.O.:480] Out: -   Physical Exam: General: NAD  Head: Normocephalic, atraumatic. Moist oral mucosal membranes  Eyes: Anicteric  Lungs:  Clear to auscultation, room air, normal effort  Heart: Regular rate and rhythm  Abdomen:  Soft, nontender  Extremities: Trace-1+ peripheral edema.  Neurologic: Awake, alert, conversant  Skin: Warm,dry, no rash  Access: Left upper AVF    Basic Metabolic Panel: Recent Labs  Lab 08/28/24 1314 08/29/24 1129 08/30/24 0524  NA 145 142 145  K 3.3* 3.1* 3.5  CL 112* 112* 114*  CO2 21* 19* 19*  GLUCOSE 149* 174* 143*  BUN 37* 41* 43*  CREATININE 3.19* 3.35* 3.65*  CALCIUM  9.2 8.2* 8.1*    Liver Function Tests: Recent  Labs  Lab 08/28/24 1314  AST 15  ALT 8  ALKPHOS 156*  BILITOT 0.3  PROT 6.8  ALBUMIN 3.1*   No results for input(s): LIPASE, AMYLASE in the last 168 hours. No results for input(s): AMMONIA in the last 168 hours.  CBC: Recent Labs  Lab 08/28/24 1314  WBC 6.5  NEUTROABS 4.4  HGB 12.0  HCT 40.5  MCV 92.5  PLT 382    Cardiac Enzymes: No results for input(s): CKTOTAL, CKMB, CKMBINDEX, TROPONINI in the last 168 hours.  BNP: Invalid input(s): POCBNP  CBG: Recent Labs  Lab 08/29/24 1211 08/29/24 1620 08/29/24 2113 08/30/24 0728 08/30/24 1137  GLUCAP 168* 160* 192* 106* 175*    Microbiology: Results for orders placed or performed during the hospital encounter of 08/28/24  Blood culture (routine x 2)     Status: None (Preliminary result)   Collection Time: 08/28/24  3:37 PM   Specimen: BLOOD  Result Value Ref Range Status   Specimen Description BLOOD RIGHT ANTECUBITAL  Final   Special Requests   Final    BOTTLES DRAWN AEROBIC AND ANAEROBIC Blood Culture results may not be optimal due to an inadequate volume of blood received in culture bottles   Culture   Final    NO GROWTH 2 DAYS Performed at [facility], [address] [place], [place] [address]    Report Status PENDING  Incomplete  Blood culture (routine x 2)     Status: None (Preliminary result)   Collection  Time: 08/28/24  3:37 PM   Specimen: BLOOD  Result Value Ref Range Status   Specimen Description BLOOD BLOOD RIGHT FOREARM  Final   Special Requests   Final    BOTTLES DRAWN AEROBIC AND ANAEROBIC Blood Culture results may not be optimal due to an inadequate volume of blood received in culture bottles   Culture   Final    NO GROWTH 2 DAYS Performed at [facility], [address] [place], [place] [address]    Report Status PENDING  Incomplete    Coagulation Studies: No results for input(s): LABPROT, INR in the last 72 hours.  Urinalysis: Recent Labs     08/28/24 1537  COLORURINE YELLOW*  LABSPEC 1.024  PHURINE 5.0  GLUCOSEU 150*  HGBUR SMALL*  BILIRUBINUR NEGATIVE  KETONESUR NEGATIVE  PROTEINUR >=300*  NITRITE NEGATIVE  LEUKOCYTESUR NEGATIVE      Imaging: ECHOCARDIOGRAM COMPLETE Result Date: 08/29/2024    ECHOCARDIOGRAM REPORT   Patient Name:   Alicia Rogers Date of Exam: 08/28/2024 Medical Rec #:  [id number]          Height:       63.0 in Accession #:    [id number]         Weight:       125.0 lb Date of Birth:  09/07/1960           BSA:          1.584 m Patient Age:    63 years           BP:           197/65 mmHg Patient Gender: F                  HR:           55 bpm. Exam Location:  [facility] Procedure: 2D Echo, Cardiac Doppler and Color Doppler (Both Spectral and Color            Flow Doppler were utilized during procedure). Indications:     I51.7 Cardiomegaly  History:         Patient has prior history of Echocardiogram examinations, most                  recent 05/10/2023. Risk Factors:Hypertension and Diabetes.  Sonographer:     Carl Coma RDCS Referring Phys:  [id number] LANETA BLUNT Diagnosing Phys: Keller Alluri IMPRESSIONS  1. Left ventricular ejection fraction, by estimation, is 55 to 60%. The left ventricle has normal function. The left ventricle has no regional wall motion abnormalities. There is mild left ventricular hypertrophy. Left ventricular diastolic parameters are consistent with Grade I diastolic dysfunction (impaired relaxation).  2. Right ventricular systolic function is normal. The right ventricular size is normal. There is mildly elevated pulmonary artery systolic pressure.  3. Left atrial size was mildly dilated.  4. The mitral valve is normal in structure. Trivial mitral valve regurgitation.  5. The aortic valve is tricuspid. Aortic valve regurgitation is not visualized.  6. The inferior vena cava is normal in size with <50% respiratory variability, suggesting right atrial pressure of 8 mmHg. FINDINGS  Left  Ventricle: Left ventricular ejection fraction, by estimation, is 55 to 60%. The left ventricle has normal function. The left ventricle has no regional wall motion abnormalities. The left ventricular internal cavity size was normal in size. There is  mild left ventricular hypertrophy. Left ventricular diastolic parameters are consistent with Grade I diastolic dysfunction (impaired relaxation). Right Ventricle: The right  ventricular size is normal. No increase in right ventricular wall thickness. Right ventricular systolic function is normal. There is mildly elevated pulmonary artery systolic pressure. The tricuspid regurgitant velocity is 2.96  m/s, and with an assumed right atrial pressure of 8 mmHg, the estimated right ventricular systolic pressure is 43.0 mmHg. Left Atrium: Left atrial size was mildly dilated. Right Atrium: Right atrial size was normal in size. Pericardium: There is no evidence of pericardial effusion. Mitral Valve: The mitral valve is normal in structure. Trivial mitral valve regurgitation. Tricuspid Valve: The tricuspid valve is normal in structure. Tricuspid valve regurgitation is mild. Aortic Valve: The aortic valve is tricuspid. Aortic valve regurgitation is not visualized. Pulmonic Valve: The pulmonic valve was not well visualized. Pulmonic valve regurgitation is not visualized. Aorta: The aortic root is normal in size and structure. Venous: The inferior vena cava is normal in size with less than 50% respiratory variability, suggesting right atrial pressure of 8 mmHg. IAS/Shunts: The atrial septum is grossly normal.  LEFT VENTRICLE PLAX 2D LVIDd:         4.40 cm   Diastology LVIDs:         3.00 cm   LV e' medial:    6.74 cm/s LV PW:         1.30 cm   LV E/e' medial:  15.3 LV IVS:        0.90 cm   LV e' lateral:   7.94 cm/s LVOT diam:     1.60 cm   LV E/e' lateral: 13.0 LV SV:         41 LV SV Index:   26 LVOT Area:     2.01 cm  RIGHT VENTRICLE             IVC RV Basal diam:  3.50 cm     IVC  diam: 1.80 cm RV S prime:     11.67 cm/s TAPSE (M-mode): 2.3 cm LEFT ATRIUM             Index        RIGHT ATRIUM           Index LA diam:        4.10 cm 2.59 cm/m   RA Area:     12.30 cm LA Vol (A2C):   55.8 ml 35.24 ml/m  RA Volume:   29.60 ml  18.69 ml/m LA Vol (A4C):   46.6 ml 29.43 ml/m LA Biplane Vol: 52.8 ml 33.34 ml/m  AORTIC VALVE LVOT Vmax:   88.55 cm/s LVOT Vmean:  57.450 cm/s LVOT VTI:    0.205 m  AORTA Ao Root diam: 2.40 cm MITRAL VALVE                TRICUSPID VALVE MV Area (PHT): 3.08 cm     TR Peak grad:   35.0 mmHg MV Decel Time: 246 msec     TR Vmax:        296.00 cm/s MV E velocity: 103.00 cm/s MV A velocity: 90.30 cm/s   SHUNTS MV E/A ratio:  1.14         Systemic VTI:  0.20 m                             Systemic Diam: 1.60 cm Keller Paterson Electronically signed by Keller Paterson Signature Date/Time: 08/29/2024/8:51:40 AM    Final    DG Hand 2 View Left Result Date: 08/28/2024 EXAM: 1  OR 2 VIEW(S) XRAY OF THE LEFT HAND 08/28/2024 02:58:00 PM COMPARISON: None available. CLINICAL HISTORY: FINDINGS: BONES AND JOINTS: No acute fracture. No malalignment. Flexion of the second through fifth metacarpophalangeal joints. SOFT TISSUES: Diffuse soft tissue swelling of the hand and forearm. IMPRESSION: 1. Diffuse soft tissue swelling of the hand and forearm. 2. Flexion of the second through fifth metacarpophalangeal joints. Electronically signed by: Elsie Gravely MD 08/28/2024 03:06 PM EST RP Workstation: [id number]   DG Chest Portable 1 View Result Date: 08/28/2024 EXAM: 1 VIEW(S) XRAY OF THE CHEST 08/28/2024 02:05:00 PM COMPARISON: 07/30/2024 CLINICAL HISTORY: Anasarca. FINDINGS: LUNGS AND PLEURA: Mild bibasilar opacities. Bilateral trace pleural effusion. No pneumothorax. HEART AND MEDIASTINUM: Cardiomegaly. Aortic calcification. BONES AND SOFT TISSUES: No acute osseous abnormality. IMPRESSION: 1. Bilateral trace pleural effusions with mild bibasilar opacities. 2. Cardiomegaly.  Electronically signed by: Elsie Gravely MD 08/28/2024 03:05 PM EST RP Workstation: [id number]     Medications:     allopurinol   100 mg Oral Daily   amLODipine   10 mg Oral Daily   aspirin  EC  81 mg Oral Daily   atorvastatin   40 mg Oral Daily   budesonide -glycopyrrolate -formoterol   2 puff Inhalation BID   cloNIDine   0.1 mg Oral TID   furosemide   80 mg Intravenous Once   heparin   5,000 Units Subcutaneous Q8H   insulin  aspart  0-9 Units Subcutaneous TID WC   isosorbide  mononitrate  30 mg Oral QHS   losartan   50 mg Oral Daily   paliperidone   3 mg Oral Daily   pantoprazole   40 mg Oral Daily   hydrALAZINE , ipratropium-albuterol , midazolam  PF, sodium chloride   Assessment/ Plan:  Alicia Rogers is a 64 y.o.  female  with past medical history of anemia, depression, diabetes, hypertension, paranoid schizophrenia and end stage renal disease on hemodialysis. Patient was brought to emergency department due to lack of dialysis and mentation.  [facility]/T-S/Lt AVF   End-stage renal disease on hemodialysis.  Last treatment completed on 1/20.  Patient continues to refuse dialysis. States she wants to continue dialysis but not today. Support continued palliative conversations for GOC.   2.  Hypertension with chronic kidney disease.  Home regimen includes amlodipine , clonidine , and losartan .  Currently receiving amlodipine , clonidine , furosemide , isosorbide , and losartan . Blood pressure slightly elevated today  3. Anemia of chronic kidney disease Lab Results  Component Value Date   HGB 12.0 08/28/2024    Hemoglobin acceptable for renal patient.  4. Secondary Hyperparathyroidism: with outpatient labs: PTH 488, phosphorus 3.7, calcium  8.8 on 08/15/23.   Lab Results  Component Value Date   CALCIUM  8.1 (L) 08/30/2024   CAION 1.29 07/14/2023   PHOS 4.9 (H) 05/28/2024    Calcium  and phos within optimal range.    LOS: 2 Valborg Friar 1/29/20261:48 PM   "

## 2024-08-30 NOTE — Progress Notes (Signed)
 "                                                                                                                                                                                               Palliative Care Progress Note, Assessment & Plan   Patient Name: Alicia Rogers       Date: 08/30/2024 DOB: [birth year]  Age: 64 y.o. MRN#: [id number] Attending Physician: Kandis Devaughn Sayres, MD Primary Care Physician: Austin Candyce Stall, MD Admit Date: 08/28/2024  Subjective: Patient is sitting at edge of bed with sitter at bedside.  She is awake, alert, acknowledges my presence, and is able to make her wishes known.  She is just returned from going to the bathroom.  She has no acute complaints at this time.  No family or friends are present at bedside during my visit.  HPI: 64 y.o. female  with past medical history of type 2 diabetes, CKD, MGUS, schizoaffective disorder, depression, HTN, GERD, ESRD, secondary hyperparathyroidism, and bipolar admitted on 08/28/2024 with chief complaint of sisters concerns for patient missing dialysis.   As per chart review, patient has not been to dialysis since 08/11/2024.  Upon presentation to the ED, patient was conversational but her speech was slow and slurred.  She was able to confirm she is at the hospital without any breathing problems.    As per chart review, patient is shared with the previous attending reasons for not going to dialysis include they are talking bad about me there and just not wanting to go.   Patient's sister [name] shares she is working with DSS to arrange guardianship and is concerned with patient's increased shortness of breath over the past few days thus resulting in admission to ED.   Patient is currently being treated for anasarca, ESRD, schizoaffective disorder.  Psychiatry and nephrology were consulted.   As per psychiatry's evaluation and recommendations, patient is involuntarily committed and was recommended for inpatient psychiatric  admission following medical stabilization.   PMT was consulted by Dr. Kandis on 1/28 for goals of care-significant psych problems and intermittently refusing dialysis.    Of note, patient is familiar to PMT as I met with patient during her October 2024 admission.  Following up today for continued discussion with patient regarding boundaries and goals of medical treatments.  Summary of counseling/coordination of care: Chart review completed prior to meeting patient including:  -Labs: Creatinine 3.65, GFR 13 -Vital signs: WNL -Progress notes: Reviewed nephrology note wherein patient endorses she wants to continue with dialysis but not today -Available advanced directive documents from current and previous encounters: No advance directive/living will available  in ACP tab of epic  After reviewing the patient's chart and assessing the patient at bedside, I spoke with patient in regards to symptom management and goals of care.   Symptoms assessed.  Patient endorses she feels well and is ready to go home.  She shares she has no acute issues.  She denies headache, chest pain, N/V/D.  Noted edema of left upper extremity.  She shares it is not painful.  Area is not warm to touch.  She shares the swelling has been there for a while.  Also noted mild pitting edema of bilateral lower extremities.  Patient shares that has also been present for quite a while.  Discussed that fluid retention is related with lack of filtration via hemodialysis.  Education provided on fluid exchange and filtering of blood via hemodialysis since patient's kidneys are not functioning.  She shares understanding of the purpose of hemodialysis.  Again I discussed whether or not patient would like to continue with hemodialysis.  She shares that she would like to do hemodialysis but just not today.  When asked to elaborate further on why not today, she shares I just do not want to.  Patient speaks of wanting to go home, have  a nurse with her 24/7, and be able to do hemodialysis at home.  Attempted to clarify that no plan is in place for patient to return home with 24/7 care and at home hemodialysis.  However, I did endorse plan remains for patient to remain in hospital, continued receive medical treatment for dialysis and mental health if/when willing.  She shares understanding.  Again, she states she wishes to get out of the hospital soon as possible.  Given patient is IVC, no adjustment to plan of care CODE STATUS made at this time.  Counseled with attending, psychiatry, and nephrology in regards to plan of care.  Awaiting approval as well as bed availability for geropsych placement.  PMT will continue to follow and support.  Physical Exam Vitals reviewed.  Constitutional:      General: She is not in acute distress.    Appearance: She is normal weight.  HENT:     Head: Normocephalic.     Nose: Nose normal.     Mouth/Throat:     Mouth: Mucous membranes are moist.  Eyes:     Pupils: Pupils are equal, round, and reactive to light.  Pulmonary:     Effort: Pulmonary effort is normal.  Abdominal:     Palpations: Abdomen is soft.  Musculoskeletal:     Right lower leg: Edema present.     Left lower leg: Edema present.  Skin:    General: Skin is warm and dry.  Neurological:     Mental Status: She is alert.  Psychiatric:        Behavior: Behavior normal.              Recommendations:   Full code Patient declining HD today but endorses she wishes to continue with HD Ongoing support to define patient's goals/boundaries of medical treatment will continue from PMT   I personally spent a total of 35 minutes in the care of the patient today including preparing to see the patient, getting/reviewing separately obtained history, performing a medically appropriate exam/evaluation, counseling and educating, referring and communicating with other health care professionals, and documenting clinical information in  the EHR.   Lamarr L. Arvid, DNP, FNP-BC Palliative Medicine Team   "

## 2024-08-30 NOTE — TOC Initial Note (Signed)
 Transition of Care [facility]) - Initial/Assessment Note    Patient Details  Name: Alicia Rogers MRN: [id number] Date of Birth: Nov 18, 1960  [facility] CM/SW Contact:    Alicia ONEIDA Haddock, RN Phone Number: 08/30/2024, 3:13 PM  Clinical Narrative:                  Met with patient at bedside Patient is currently under IVC Patient states that she has been staying with her sister Alicia Rogers the past 3 weeks Family is working with [employer] for guardian. DSS case worker Alicia Rogers [phone] came to meet with patient yesterday.  Patient is requesting that she speak with Alicia Rogers again.  Alicia Rogers is aware  Current recs for inpatient psych.  [facility] will potentially have a bed tomorrow.  Referral also sent out in HUB       Patient Goals and CMS Choice            Expected Discharge Plan and Services                                              Prior Living Arrangements/Services                       Activities of Daily Living      Permission Sought/Granted                  Emotional Assessment              Admission diagnosis:  Anasarca [R60.1] Delusions (HCC) [F22] ESRD on dialysis (HCC) [N18.6, Z99.2] Non compliance w medication regimen [Z91.148] Patient Active Problem List   Diagnosis Date Noted   ESRD on dialysis (HCC) 12/23/2023   Hyperkalemia 07/07/2023   Atherosclerosis of artery of extremity with ulceration (HCC) 07/07/2023   Malnutrition of moderate degree 05/12/2023   Pressure injury of hip, unstageable (HCC) 05/09/2023   Acute urinary retention 05/08/2023   Bilateral lower extremity edema 05/08/2023   [name] (acute kidney injury) 05/08/2023   Anasarca 05/07/2023   Hypoxia 05/07/2023   Chronic kidney disease (CKD), stage V (HCC) 05/07/2023   Leukocytosis 05/07/2023   Bilateral pleural effusion 05/07/2023   Thyroid  nodule 03/09/2023   Postoperative examination 11/03/2022   Acute-on-chronic kidney injury  09/30/2022   Postoperative ileus (HCC) 09/30/2022   Postoperative intra-abdominal abscess (HCC) 09/30/2022   COPD (chronic obstructive pulmonary disease) (HCC) 09/03/2022   Vitamin D  deficiency, unspecified 09/03/2022   Allergic rhinitis 09/03/2022   Tobacco abuse counseling 09/03/2022   Pneumonia due to respiratory syncytial virus (RSV) 08/30/2022   Pulmonary nodule 08/30/2022   Iron  deficiency anemia 07/21/2022   Gastric erosion 07/21/2022   Polyp of duodenum 07/21/2022   Adenomatous polyp of descending colon 07/21/2022   Dysuria 06/30/2022   MGUS (monoclonal gammopathy of unknown significance) 02/05/2022   Stage 3b chronic kidney disease (HCC) 12/22/2021   Paranoid schizophrenia (HCC) 11/25/2021   Proteinuria, unspecified 11/25/2021   Symptomatic anemia 11/25/2021   Schizoaffective disorder, bipolar type (HCC) 09/29/2021   Hypertensive urgency 09/28/2021   Sinus bradycardia 09/27/2021   Anemia in chronic kidney disease (CKD) 09/27/2021   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   Uterine mass 03/03/2021   Essential hypertension 02/28/2020   Back pain 02/28/2020   Hematuria 02/28/2020   Type II diabetes mellitus with renal manifestations (HCC) 02/28/2020   Irritant contact dermatitis  due to other agents 03/08/2018   Increased frequency of urination 09/21/2017   Chronic kidney disease, stage 4 (severe) (HCC) 08/26/2017   Low back pain 05/26/2017   Extrapyramidal symptom 03/12/2016   Tobacco use disorder 10/11/2014   Mixed Hyperlipidemia 07/04/2014   Claudication 06/09/2014   Schizoaffective disorder, depressive type (HCC) 06/09/2014   Diabetes mellitus, type 2 (HCC) 06/09/2014   Type 2 diabetes mellitus with diabetic chronic kidney disease (HCC) 06/09/2014   Allergic contact dermatitis due to plants, except food 03/05/2014   Gout 02/20/2014   Pedal edema 02/18/2014   Hemorrhoids, external, with complication 07/17/2012   PCP:  Alicia Candyce Stall, MD Pharmacy:   [facility] -  [place], [place] - [address] [place] [address] Phone: [phone] Fax: [phone]     Social Drivers of Health (SDOH) Social History: SDOH Screenings   Food Insecurity: No Food Insecurity (08/26/2023)  Housing: Low Risk (08/26/2023)  Transportation Needs: No Transportation Needs (08/26/2023)  Utilities: Not At Risk (08/26/2023)  Alcohol Screen: Low Risk (09/29/2021)  Depression (PHQ2-9): Low Risk (01/12/2024)  Financial Resource Strain: Medium Risk (03/04/2023)   Received from [facility]  Social Connections: Unknown (09/28/2022)   Received from [facility]  Tobacco Use: High Risk (08/16/2024)   Received from [facility]   SDOH Interventions:     Readmission Risk Interventions    05/10/2023   10:05 AM  Readmission Risk Prevention Plan  Transportation Screening   HRI or Home Care Consult   Palliative Care Screening   Medication Review (RN Care Manager)      Information is confidential and restricted. Go to Review Flowsheets to unlock data.

## 2024-08-31 ENCOUNTER — Other Ambulatory Visit: Payer: Self-pay

## 2024-08-31 ENCOUNTER — Encounter: Payer: Self-pay | Admitting: Internal Medicine

## 2024-08-31 DIAGNOSIS — R601 Generalized edema: Secondary | ICD-10-CM | POA: Diagnosis not present

## 2024-08-31 LAB — GLUCOSE, CAPILLARY
Glucose-Capillary: 127 mg/dL — ABNORMAL HIGH (ref 70–99)
Glucose-Capillary: 127 mg/dL — ABNORMAL HIGH (ref 70–99)
Glucose-Capillary: 144 mg/dL — ABNORMAL HIGH (ref 70–99)

## 2024-08-31 LAB — BASIC METABOLIC PANEL WITH GFR
Anion gap: 10 (ref 5–15)
BUN: 46 mg/dL — ABNORMAL HIGH (ref 8–23)
CO2: 19 mmol/L — ABNORMAL LOW (ref 22–32)
Calcium: 8.5 mg/dL — ABNORMAL LOW (ref 8.9–10.3)
Chloride: 110 mmol/L (ref 98–111)
Creatinine, Ser: 3.84 mg/dL — ABNORMAL HIGH (ref 0.44–1.00)
GFR, Estimated: 12 mL/min — ABNORMAL LOW
Glucose, Bld: 159 mg/dL — ABNORMAL HIGH (ref 70–99)
Potassium: 3.3 mmol/L — ABNORMAL LOW (ref 3.5–5.1)
Sodium: 140 mmol/L (ref 135–145)

## 2024-08-31 LAB — MAGNESIUM: Magnesium: 1.8 mg/dL (ref 1.7–2.4)

## 2024-08-31 MED ORDER — ISOSORBIDE MONONITRATE ER 30 MG PO TB24
30.0000 mg | ORAL_TABLET | Freq: Every day | ORAL | 1 refills | Status: AC
  Filled 2024-08-31: qty 60, 60d supply, fill #0

## 2024-08-31 MED ORDER — LOSARTAN POTASSIUM 50 MG PO TABS
50.0000 mg | ORAL_TABLET | Freq: Every day | ORAL | 1 refills | Status: AC
  Filled 2024-08-31: qty 60, 60d supply, fill #0

## 2024-08-31 MED ORDER — LACTULOSE 10 GM/15ML PO SOLN
10.0000 g | Freq: Two times a day (BID) | ORAL | 0 refills | Status: AC
  Filled 2024-08-31: qty 473, 16d supply, fill #0
  Filled 2024-08-31: qty 30, fill #0

## 2024-08-31 MED ORDER — PALIPERIDONE ER 3 MG PO TB24: 3.0000 mg | ORAL_TABLET | Freq: Every day | ORAL | Status: DC

## 2024-08-31 MED ORDER — PALIPERIDONE PALMITATE ER 78 MG/0.5ML IM SUSY: 78.0000 mg | PREFILLED_SYRINGE | Freq: Once | INTRAMUSCULAR | Status: DC

## 2024-08-31 MED ORDER — ASPIRIN 81 MG PO TBEC
81.0000 mg | DELAYED_RELEASE_TABLET | Freq: Every day | ORAL | 1 refills | Status: AC
  Filled 2024-08-31: qty 90, 90d supply, fill #0

## 2024-08-31 MED ORDER — AMLODIPINE BESYLATE 10 MG PO TABS
10.0000 mg | ORAL_TABLET | Freq: Every day | ORAL | 1 refills | Status: AC
  Filled 2024-08-31: qty 60, 60d supply, fill #0

## 2024-08-31 MED ORDER — SODIUM BICARBONATE 650 MG PO TABS
650.0000 mg | ORAL_TABLET | Freq: Two times a day (BID) | ORAL | Status: DC
  Administered 2024-08-31: 650 mg via ORAL
  Filled 2024-08-31 (×2): qty 1

## 2024-08-31 MED ORDER — POTASSIUM CHLORIDE CRYS ER 20 MEQ PO TBCR
40.0000 meq | EXTENDED_RELEASE_TABLET | Freq: Once | ORAL | Status: AC
  Administered 2024-08-31: 40 meq via ORAL
  Filled 2024-08-31 (×2): qty 2

## 2024-08-31 MED ORDER — FUROSEMIDE 20 MG PO TABS
20.0000 mg | ORAL_TABLET | ORAL | 1 refills | Status: AC
  Filled 2024-08-31: qty 30, 60d supply, fill #0

## 2024-08-31 MED ORDER — PALIPERIDONE ER 3 MG PO TB24
3.0000 mg | ORAL_TABLET | Freq: Every day | ORAL | 1 refills | Status: AC
  Filled 2024-08-31: qty 60, 60d supply, fill #0

## 2024-08-31 MED ORDER — SODIUM BICARBONATE 650 MG PO TABS
650.0000 mg | ORAL_TABLET | Freq: Two times a day (BID) | ORAL | 1 refills | Status: AC
  Filled 2024-08-31: qty 60, 30d supply, fill #0

## 2024-08-31 MED ORDER — ATORVASTATIN CALCIUM 40 MG PO TABS
40.0000 mg | ORAL_TABLET | Freq: Every day | ORAL | 1 refills | Status: AC
  Filled 2024-08-31: qty 30, 30d supply, fill #0

## 2024-08-31 NOTE — Progress Notes (Signed)
 D/C order noted. Contacted [facility]  to be advised of pt and d/c today. Requested documents faxed to clinic for continuation of care.  Suzen Satchel Dialysis Navigator [phone]

## 2024-08-31 NOTE — Discharge Summary (Addendum)
 Alicia Rogers FMW:[id number] DOB: [birth year] DOA: 08/28/2024  PCP: Alicia Candyce Stall, MD  Admit date: 08/28/2024 Discharge date: 08/31/2024  Time spent: 35 minutes  Recommendations for Outpatient Follow-up:  Nephrology f/u Behavioral health f/u Establish with palliative care outpatient     Discharge Diagnoses:  Principal Problem:   Anasarca Active Problems:   COPD (chronic obstructive pulmonary disease) (HCC)   Essential hypertension   Schizoaffective disorder, bipolar type (HCC)   Diabetes mellitus, type 2 (HCC)   Paranoid schizophrenia (HCC)   ESRD on dialysis [facility])   Discharge Condition: stable  Diet recommendation: heart healthy  Filed Weights   08/30/24 0500 08/31/24 0500  Weight: 58 kg 57.7 kg    History of present illness:  From admission h and p Expand All Collapse All   HISTORY AND PHYSICAL       Alicia Rogers     FMW:[id number] DOB: 07-28-1961    Date of Service: 08/28/24 Requesting physician/APP from ED: Treatment Team:  Attending Provider: [name], Natalie, DO   PCP: Alicia Candyce Stall, MD     Alicia Rogers is a 64 y.o. female w/ ESRD, DM2, HTN, HLD, GERD, schizoaffective disorder, shizophrenia, bipolar, catatonia. She is brought to the ED today from home by her sister w/ concern for SOB / medical noncompliance    HPI:    Spoke w/ patient at bedside in ED. She has gotten dose Versed  in the ED to allow staff to place an IV. She is conversational but speech is slow / slurred. She can tell me she is at the hospital for breathing problems. She states she doesn't want dialysis just dont want to despite education that refusing dialysis will result in eventual death.   Spoke w/ sister Alicia Rogers who gave some more information: She states things started this time around Oct 2025 - patient was kicked out of [facility]. She was living w/ her sister Alicia Rogers. Alicia Rogers states that Alicia Rogers was given a list of her medications / prescriptions. Rx unable to be filled at first  until patient went to primary care few weeks ago, they rx some meds for her to [facility]. Alicia Rogers states that now Alicia Rogers wont take the medications. Alicia Rogers is giving a lot of different reasons for not going to dialysis, ranging from thery're talking bad about me there to just not wanting to go. Alicia Rogers also states that Alicia Rogers was getting a monthly injection for schizophrenia at [facility] - last time she got the shot was probably 05/2024. Alicia Rogers is working w/ DSS to arrange guardianship whether guardian is family or the state. Only other concern at this time is the shortness of breath worsening over past few days and also concern about feet and hands - nails are long, she has not been letting her sisters cut her nails. Alicia Rogers states that she has not been letting us  do much to help her and I had to raise my voice to even get her to come to the ER and I hate doing that but she won't listen. Last dialysis was 08/11/24.     Hospital Course:   # ESRD Intermittently refusing dialysis, last received 1/10. Nephrology will not sedate/restrain  for this, which is understandable. Working to get mental illness under better control. Continues to refuse dialysis here. Should she continue to refuse dialysis, this will be come a hospice/comfort care situation. Palliative involved here and will be referred to outpatient palliative care at discharge.    # Schizoaffective disorder Mental illness  significant contributor to underlying medical problems - BH following, have re-started invega , are not planning additional med changes. Initially advised inpatient psychiatric admission but then changed that rec - no additional inpatient behavioral interventions per behavioral health, stable for discharge, f/u [facility]   # HTN Bp labile - continue home amlodipine , clonidine , losartan    # T2DM Glucose controlled   # COPD No exacerbation - home breztri    # CAD No chest pain - home asa,  statin, losartan , imdur    Procedures: none   Consultations: Nephrology Palliative Behavioral health  Discharge Exam: Vitals:   08/31/24 0327 08/31/24 0723  BP: (!) 138/52 (!) 172/62  Pulse: (!) 54 67  Resp: 18 17  Temp: 97.9 F (36.6 C) 98.7 F (37.1 C)  SpO2: 100% 100%    General exam: Appears calm and comfortable, chronically ill  Respiratory system: Clear to auscultation. Respiratory effort normal. Cardiovascular system: S1 & S2 heard, RRR. Gastrointestinal system: Abdomen is nondistended, soft and nontender.   Central nervous system: Alert, oriented to self Extremities: Symmetric 5 x 5 power. Skin: No rashes, lesions or ulcers Psychiatry: calm, guarded     Discharge Instructions   Discharge Instructions     Amb Referral to Palliative Care   Complete by: As directed    Increase activity slowly   Complete by: As directed       Allergies as of 08/31/2024       Reactions   Lisinopril Other (See Comments)   Cough Other Reaction(s): Other (See Comments), Tongue swollen   Penicillins    Rash        Medication List     STOP taking these medications    allopurinol  100 MG tablet Commonly known as: ZYLOPRIM    AMINO ACID PROTEIN PO   ascorbic acid  500 MG tablet Commonly known as: VITAMIN C    buPROPion  100 MG tablet Commonly known as: WELLBUTRIN    cetirizine 5 MG tablet Commonly known as: ZYRTEC   cloNIDine  0.1 MG tablet Commonly known as: CATAPRES    collagenase  250 UNIT/GM ointment Commonly known as: SANTYL    cyclobenzaprine  10 MG tablet Commonly known as: FLEXERIL    dapagliflozin  propanediol 5 MG Tabs tablet Commonly known as: FARXIGA    ergocalciferol  1.25 MG (50000 UT) capsule Commonly known as: VITAMIN D2   escitalopram  20 MG tablet Commonly known as: LEXAPRO    feeding supplement Liqd   ferrous sulfate  325 (65 FE) MG tablet   fluticasone  50 MCG/ACT nasal spray Commonly known as: FLONASE    gabapentin  100 MG  capsule Commonly known as: NEURONTIN    hydrALAZINE  25 MG tablet Commonly known as: APRESOLINE    hydrALAZINE  50 MG tablet Commonly known as: APRESOLINE    ketoconazole  2 % cream Commonly known as: NIZORAL    lidocaine -prilocaine  cream Commonly known as: EMLA    linagliptin  5 MG Tabs tablet Commonly known as: TRADJENTA    lip balm Oint   loperamide  2 MG capsule Commonly known as: IMODIUM    melatonin 3 MG Tabs tablet   metolazone  5 MG tablet Commonly known as: ZAROXOLYN    multivitamin capsule   Narcan 4 MG/0.1ML Liqd nasal spray kit Generic drug: naloxone   nystatin 100000 UNIT/ML suspension Commonly known as: MYCOSTATIN   omeprazole  20 MG capsule Commonly known as: PRILOSEC   Paliperidone  ER injection Commonly known as: INVEGA  SUSTENNA   polyethylene glycol powder 17 GM/SCOOP powder Commonly known as: GLYCOLAX /MIRALAX    Polyox WSR-301 Powd   sennosides-docusate sodium  8.6-50 MG tablet Commonly known as: SENOKOT-S   sodium zirconium cyclosilicate  10 g Pack  packet Commonly known as: LOKELMA    Tylenol  325 MG tablet Generic drug: acetaminophen    Zinc  Sulfate 220 (50 Zn) MG Tabs       TAKE these medications    amLODipine  10 MG tablet Commonly known as: NORVASC  Take 1 tablet (10 mg total) by mouth daily.   aspirin  EC 81 MG tablet Take 1 tablet (81 mg total) by mouth daily.   atorvastatin  40 MG tablet Commonly known as: LIPITOR Take 1 tablet (40 mg total) by mouth daily.   Breztri  Aerosphere 160-9-4.8 MCG/ACT Aero inhaler Generic drug: budesonide -glycopyrrolate -formoterol  Inhale 2 puffs into the lungs 2 (two) times daily.   furosemide  20 MG tablet Commonly known as: LASIX  Take 1 tablet (20 mg total) by mouth every other day. On non-dialysis days What changed:  additional instructions Another medication with the same name was removed. Continue taking this medication, and follow the directions you see here.   isosorbide  mononitrate 30 MG 24 hr  tablet Commonly known as: IMDUR  Take 1 tablet (30 mg total) by mouth at bedtime. What changed:  medication strength how much to take additional instructions   lactulose  10 g packet Commonly known as: CEPHULAC  Take 1 packet (10 g total) by mouth 2 (two) times daily.   losartan  50 MG tablet Commonly known as: COZAAR  Take 1 tablet (50 mg total) by mouth daily. Start taking on: September 01, 2024 What changed:  medication strength how much to take   paliperidone  3 MG 24 hr tablet Commonly known as: INVEGA  Take 1 tablet (3 mg total) by mouth daily. Start taking on: September 01, 2024   sodium bicarbonate  650 MG tablet Take 1 tablet (650 mg total) by mouth 2 (two) times daily.       Allergies[1]  Follow-up Information     your mental health team with [facility].          Dennise Capri, MD Follow up.   Specialty: Nephrology Contact information: [address] [place] [address] [phone]                  The results of significant diagnostics from this hospitalization (including imaging, microbiology, ancillary and laboratory) are listed below for reference.    Significant Diagnostic Studies: ECHOCARDIOGRAM COMPLETE Result Date: 08/29/2024    ECHOCARDIOGRAM REPORT   Patient Name:   CHANCE KARAM Date of Exam: 08/28/2024 Medical Rec #:  [id number]          Height:       63.0 in Accession #:    [id number]         Weight:       125.0 lb Date of Birth:  07-30-1961           BSA:          1.584 m Patient Age:    63 years           BP:           197/65 mmHg Patient Gender: F                  HR:           55 bpm. Exam Location:  [facility] Procedure: 2D Echo, Cardiac Doppler and Color Doppler (Both Spectral and Color            Flow Doppler were utilized during procedure). Indications:     I51.7 Cardiomegaly  History:         Patient has prior history  of Echocardiogram examinations, most                  recent 05/10/2023. Risk  Factors:Hypertension and Diabetes.  Sonographer:     Carl Coma RDCS Referring Phys:  [id number] LANETA BLUNT Diagnosing Phys: Keller Alluri IMPRESSIONS  1. Left ventricular ejection fraction, by estimation, is 55 to 60%. The left ventricle has normal function. The left ventricle has no regional wall motion abnormalities. There is mild left ventricular hypertrophy. Left ventricular diastolic parameters are consistent with Grade I diastolic dysfunction (impaired relaxation).  2. Right ventricular systolic function is normal. The right ventricular size is normal. There is mildly elevated pulmonary artery systolic pressure.  3. Left atrial size was mildly dilated.  4. The mitral valve is normal in structure. Trivial mitral valve regurgitation.  5. The aortic valve is tricuspid. Aortic valve regurgitation is not visualized.  6. The inferior vena cava is normal in size with <50% respiratory variability, suggesting right atrial pressure of 8 mmHg. FINDINGS  Left Ventricle: Left ventricular ejection fraction, by estimation, is 55 to 60%. The left ventricle has normal function. The left ventricle has no regional wall motion abnormalities. The left ventricular internal cavity size was normal in size. There is  mild left ventricular hypertrophy. Left ventricular diastolic parameters are consistent with Grade I diastolic dysfunction (impaired relaxation). Right Ventricle: The right ventricular size is normal. No increase in right ventricular wall thickness. Right ventricular systolic function is normal. There is mildly elevated pulmonary artery systolic pressure. The tricuspid regurgitant velocity is 2.96  m/s, and with an assumed right atrial pressure of 8 mmHg, the estimated right ventricular systolic pressure is 43.0 mmHg. Left Atrium: Left atrial size was mildly dilated. Right Atrium: Right atrial size was normal in size. Pericardium: There is no evidence of pericardial effusion. Mitral Valve: The mitral  valve is normal in structure. Trivial mitral valve regurgitation. Tricuspid Valve: The tricuspid valve is normal in structure. Tricuspid valve regurgitation is mild. Aortic Valve: The aortic valve is tricuspid. Aortic valve regurgitation is not visualized. Pulmonic Valve: The pulmonic valve was not well visualized. Pulmonic valve regurgitation is not visualized. Aorta: The aortic root is normal in size and structure. Venous: The inferior vena cava is normal in size with less than 50% respiratory variability, suggesting right atrial pressure of 8 mmHg. IAS/Shunts: The atrial septum is grossly normal.  LEFT VENTRICLE PLAX 2D LVIDd:         4.40 cm   Diastology LVIDs:         3.00 cm   LV e' medial:    6.74 cm/s LV PW:         1.30 cm   LV E/e' medial:  15.3 LV IVS:        0.90 cm   LV e' lateral:   7.94 cm/s LVOT diam:     1.60 cm   LV E/e' lateral: 13.0 LV SV:         41 LV SV Index:   26 LVOT Area:     2.01 cm  RIGHT VENTRICLE             IVC RV Basal diam:  3.50 cm     IVC diam: 1.80 cm RV S prime:     11.67 cm/s TAPSE (M-mode): 2.3 cm LEFT ATRIUM             Index        RIGHT ATRIUM  Index LA diam:        4.10 cm 2.59 cm/m   RA Area:     12.30 cm LA Vol (A2C):   55.8 ml 35.24 ml/m  RA Volume:   29.60 ml  18.69 ml/m LA Vol (A4C):   46.6 ml 29.43 ml/m LA Biplane Vol: 52.8 ml 33.34 ml/m  AORTIC VALVE LVOT Vmax:   88.55 cm/s LVOT Vmean:  57.450 cm/s LVOT VTI:    0.205 m  AORTA Ao Root diam: 2.40 cm MITRAL VALVE                TRICUSPID VALVE MV Area (PHT): 3.08 cm     TR Peak grad:   35.0 mmHg MV Decel Time: 246 msec     TR Vmax:        296.00 cm/s MV E velocity: 103.00 cm/s MV A velocity: 90.30 cm/s   SHUNTS MV E/A ratio:  1.14         Systemic VTI:  0.20 m                             Systemic Diam: 1.60 cm Keller Paterson Electronically signed by Keller Paterson Signature Date/Time: 08/29/2024/8:51:40 AM    Final    DG Hand 2 View Left Result Date: 08/28/2024 EXAM: 1 OR 2 VIEW(S) XRAY OF THE LEFT  HAND 08/28/2024 02:58:00 PM COMPARISON: None available. CLINICAL HISTORY: FINDINGS: BONES AND JOINTS: No acute fracture. No malalignment. Flexion of the second through fifth metacarpophalangeal joints. SOFT TISSUES: Diffuse soft tissue swelling of the hand and forearm. IMPRESSION: 1. Diffuse soft tissue swelling of the hand and forearm. 2. Flexion of the second through fifth metacarpophalangeal joints. Electronically signed by: Elsie Gravely MD 08/28/2024 03:06 PM EST RP Workstation: [id number]   DG Chest Portable 1 View Result Date: 08/28/2024 EXAM: 1 VIEW(S) XRAY OF THE CHEST 08/28/2024 02:05:00 PM COMPARISON: 07/30/2024 CLINICAL HISTORY: Anasarca. FINDINGS: LUNGS AND PLEURA: Mild bibasilar opacities. Bilateral trace pleural effusion. No pneumothorax. HEART AND MEDIASTINUM: Cardiomegaly. Aortic calcification. BONES AND SOFT TISSUES: No acute osseous abnormality. IMPRESSION: 1. Bilateral trace pleural effusions with mild bibasilar opacities. 2. Cardiomegaly. Electronically signed by: Elsie Gravely MD 08/28/2024 03:05 PM EST RP Workstation: [id number]    Microbiology: Recent Results (from the past 240 hours)  Blood culture (routine x 2)     Status: None (Preliminary result)   Collection Time: 08/28/24  3:37 PM   Specimen: BLOOD  Result Value Ref Range Status   Specimen Description BLOOD RIGHT ANTECUBITAL  Final   Special Requests   Final    BOTTLES DRAWN AEROBIC AND ANAEROBIC Blood Culture results may not be optimal due to an inadequate volume of blood received in culture bottles   Culture   Final    NO GROWTH 3 DAYS Performed at [facility], [address], [place], [place] [address]    Report Status PENDING  Incomplete  Blood culture (routine x 2)     Status: None (Preliminary result)   Collection Time: 08/28/24  3:37 PM   Specimen: BLOOD  Result Value Ref Range Status   Specimen Description BLOOD BLOOD RIGHT FOREARM  Final   Special Requests   Final    BOTTLES DRAWN  AEROBIC AND ANAEROBIC Blood Culture results may not be optimal due to an inadequate volume of blood received in culture bottles   Culture   Final    NO GROWTH 3 DAYS Performed at [facility],  [address], [place], [place] [address]    Report Status PENDING  Incomplete     Labs: Basic Metabolic Panel: Recent Labs  Lab 08/28/24 1314 08/29/24 1129 08/30/24 0524 08/31/24 0417  NA 145 142 145 140  K 3.3* 3.1* 3.5 3.3*  CL 112* 112* 114* 110  CO2 21* 19* 19* 19*  GLUCOSE 149* 174* 143* 159*  BUN 37* 41* 43* 46*  CREATININE 3.19* 3.35* 3.65* 3.84*  CALCIUM  9.2 8.2* 8.1* 8.5*  MG  --   --   --  1.8   Liver Function Tests: Recent Labs  Lab 08/28/24 1314  AST 15  ALT 8  ALKPHOS 156*  BILITOT 0.3  PROT 6.8  ALBUMIN 3.1*   No results for input(s): LIPASE, AMYLASE in the last 168 hours. No results for input(s): AMMONIA in the last 168 hours. CBC: Recent Labs  Lab 08/28/24 1314  WBC 6.5  NEUTROABS 4.4  HGB 12.0  HCT 40.5  MCV 92.5  PLT 382   Cardiac Enzymes: No results for input(s): CKTOTAL, CKMB, CKMBINDEX, TROPONINI in the last 168 hours. BNP: BNP (last 3 results) No results for input(s): BNP in the last 8760 hours.  ProBNP (last 3 results) Recent Labs    07/30/24 0028 08/28/24 1314  PROBNP 8,261.0* 9,877.0*    CBG: Recent Labs  Lab 08/30/24 1137 08/30/24 1653 08/30/24 2112 08/31/24 0729 08/31/24 1115  GLUCAP 175* 104* 138* 127* 127*       Signed:  Devaughn KATHEE Ban MD.  [facility] 08/31/2024, 2:47 PM     [1]  Allergies Allergen Reactions   Lisinopril Other (See Comments)    Cough  Other Reaction(s): Other (See Comments), Tongue swollen   Penicillins     Rash

## 2024-08-31 NOTE — Progress Notes (Signed)
 Pt lying in bed, safety sitter present. Asked pt if she would like to take medications and she would not look at me or open her eyes. I asked pt again about taking medications and she states no with eyes closed. MD Wouk informed

## 2024-08-31 NOTE — Progress Notes (Signed)
 " [facility]  ROUNDING NOTE   Subjective:   Alicia Rogers is a 64 y.o. female with past medical history of anemia, depression, diabetes, hypertension, paranoid schizophrenia and end stage renal disease on hemodialysis. Patient was brought to emergency department with her sister due to concerns of missed dialysis.  Also voices concerns of mental instability.  Patient is currently admitted for Anasarca [R60.1] Delusions (HCC) [F22] ESRD on dialysis (HCC) [N18.6, Z99.2] Non compliance w medication regimen [Z91.148]  Patient is known to our practice and receives outpatient dialysis treatments at [facility] on a Tuesday and Saturday schedule.    Update: Patient seen sitting up in bed Safety sitter at bedside, states she did not eat much breakfast Continues to refuse dialysis   Objective:  Vital signs in last 24 hours:  Temp:  [97.7 F (36.5 C)-98.7 F (37.1 C)] 98.7 F (37.1 C) (01/30 0723) Pulse Rate:  [54-67] 67 (01/30 0723) Resp:  [16-18] 17 (01/30 0723) BP: (134-172)/(52-62) 172/62 (01/30 0723) SpO2:  [100 %] 100 % (01/30 0723) Weight:  [57.7 kg] 57.7 kg (01/30 0500)  Weight change: -0.3 kg Filed Weights   08/30/24 0500 08/31/24 0500  Weight: 58 kg 57.7 kg    Intake/Output: I/O last 3 completed shifts: In: 720 [P.O.:720] Out: -    Intake/Output this shift:  Total I/O In: 240 [P.O.:240] Out: -   Physical Exam: General: NAD  Head: Normocephalic, atraumatic. Moist oral mucosal membranes  Eyes: Anicteric  Lungs:  Clear to auscultation, room air, normal effort  Heart: Regular rate and rhythm  Abdomen:  Soft, nontender  Extremities: 1+ peripheral edema.  Neurologic: Awake, alert, conversant  Skin: Warm,dry, no rash  Access: Left upper AVF    Basic Metabolic Panel: Recent Labs  Lab 08/28/24 1314 08/29/24 1129 08/30/24 0524 08/31/24 0417  NA 145 142 145 140  K 3.3* 3.1* 3.5 3.3*  CL 112* 112* 114* 110  CO2 21* 19* 19* 19*   GLUCOSE 149* 174* 143* 159*  BUN 37* 41* 43* 46*  CREATININE 3.19* 3.35* 3.65* 3.84*  CALCIUM  9.2 8.2* 8.1* 8.5*  MG  --   --   --  1.8    Liver Function Tests: Recent Labs  Lab 08/28/24 1314  AST 15  ALT 8  ALKPHOS 156*  BILITOT 0.3  PROT 6.8  ALBUMIN 3.1*   No results for input(s): LIPASE, AMYLASE in the last 168 hours. No results for input(s): AMMONIA in the last 168 hours.  CBC: Recent Labs  Lab 08/28/24 1314  WBC 6.5  NEUTROABS 4.4  HGB 12.0  HCT 40.5  MCV 92.5  PLT 382    Cardiac Enzymes: No results for input(s): CKTOTAL, CKMB, CKMBINDEX, TROPONINI in the last 168 hours.  BNP: Invalid input(s): POCBNP  CBG: Recent Labs  Lab 08/30/24 1137 08/30/24 1653 08/30/24 2112 08/31/24 0729 08/31/24 1115  GLUCAP 175* 104* 138* 127* 127*    Microbiology: Results for orders placed or performed during the hospital encounter of 08/28/24  Blood culture (routine x 2)     Status: None (Preliminary result)   Collection Time: 08/28/24  3:37 PM   Specimen: BLOOD  Result Value Ref Range Status   Specimen Description BLOOD RIGHT ANTECUBITAL  Final   Special Requests   Final    BOTTLES DRAWN AEROBIC AND ANAEROBIC Blood Culture results may not be optimal due to an inadequate volume of blood received in culture bottles   Culture   Final    NO GROWTH  3 DAYS Performed at [facility] [address] [place], [place] [address]    Report Status PENDING  Incomplete  Blood culture (routine x 2)     Status: None (Preliminary result)   Collection Time: 08/28/24  3:37 PM   Specimen: BLOOD  Result Value Ref Range Status   Specimen Description BLOOD BLOOD RIGHT FOREARM  Final   Special Requests   Final    BOTTLES DRAWN AEROBIC AND ANAEROBIC Blood Culture results may not be optimal due to an inadequate volume of blood received in culture bottles   Culture   Final    NO GROWTH 3 DAYS Performed at [facility], [address],  [place], [place] [address]    Report Status PENDING  Incomplete    Coagulation Studies: No results for input(s): LABPROT, INR in the last 72 hours.  Urinalysis: Recent Labs    08/28/24 1537  COLORURINE YELLOW*  LABSPEC 1.024  PHURINE 5.0  GLUCOSEU 150*  HGBUR SMALL*  BILIRUBINUR NEGATIVE  KETONESUR NEGATIVE  PROTEINUR >=300*  NITRITE NEGATIVE  LEUKOCYTESUR NEGATIVE      Imaging: No results found.    Medications:     allopurinol   100 mg Oral Daily   amLODipine   10 mg Oral Daily   aspirin  EC  81 mg Oral Daily   atorvastatin   40 mg Oral Daily   budesonide -glycopyrrolate -formoterol   2 puff Inhalation BID   cloNIDine   0.1 mg Oral TID   heparin   5,000 Units Subcutaneous Q8H   insulin  aspart  0-9 Units Subcutaneous TID WC   isosorbide  mononitrate  30 mg Oral QHS   losartan   50 mg Oral Daily   paliperidone   3 mg Oral Daily   pantoprazole   40 mg Oral Daily   sodium bicarbonate   650 mg Oral BID   hydrALAZINE , ipratropium-albuterol , midazolam  PF, sodium chloride   Assessment/ Plan:  [name] is a 64 y.o.  female  with past medical history of anemia, depression, diabetes, hypertension, paranoid schizophrenia and end stage renal disease on hemodialysis. Patient was brought to emergency department due to lack of dialysis and mentation.  [facility]/T-S/Lt AVF   End-stage renal disease on hemodialysis.  Last treatment completed on 1/20.  We continue to ask patient if she wishes to perform dialysis daily. She refused today as well. I discussed that tomorrow is her scheduled day, she says she'll let me know tomorrow if she wants treatment. Did ask if she would complete a short treatment, she will let us  know in am.   2.  Hypertension with chronic kidney disease.  Home regimen includes amlodipine , clonidine , and losartan .  Currently receiving amlodipine , clonidine , furosemide , isosorbide , and losartan . Blood pressure continues to fluctuate.   3. Anemia  of chronic kidney disease Lab Results  Component Value Date   HGB 12.0 08/28/2024    Hemoglobin at goal for renal patient.   4. Secondary Hyperparathyroidism: with outpatient labs: PTH 488, phosphorus 3.7, calcium  8.8 on 08/15/23.   Lab Results  Component Value Date   CALCIUM  8.5 (L) 08/31/2024   CAION 1.29 07/14/2023   PHOS 4.9 (H) 05/28/2024    Bone minerals within optimal range.    LOS: 3 Alicia Rogers 1/30/202612:14 PM   "

## 2024-08-31 NOTE — Progress Notes (Addendum)
 " PROGRESS NOTE    Alicia Rogers  FMW:[id number] DOB: 1961-01-14 DOA: 08/28/2024 PCP: Austin Candyce Stall, MD     Brief Narrative:    Spoke w/ patient at bedside in ED. She has gotten dose Versed  in the ED to allow staff to place an IV. She is conversational but speech is slow / slurred. She can tell me she is at the hospital for breathing problems. She states she doesn't want dialysis just dont want to despite education that refusing dialysis will result in eventual death.   Spoke w/ sister Alicia Rogers who gave some more information: She states things started this time around Oct 2025 - patient was kicked out of [facility]. She was living w/ her sister Alicia Rogers. Alicia Rogers states that Alicia Rogers was given a list of her medications / prescriptions. Rx unable to be filled at first until patient went to primary care few weeks ago, they rx some meds for her to [facility]. Alicia Rogers states that now Alicia Rogers wont take the medications. Alicia Rogers is giving a lot of different reasons for not going to dialysis, ranging from thery're talking bad about me there to just not wanting to go. Alicia Rogers also states that Alicia Rogers was getting a monthly injection for schizophrenia at [facility] - last time she got the shot was probably 05/2024. Alicia Rogers is working w/ DSS to arrange guardianship whether guardian is family or the state. Only other concern at this time is the shortness of breath worsening over past few days and also concern about feet and hands - nails are long, she has not been letting her sisters cut her nails. Alicia Rogers states that she has not been letting us  do much to help her and I had to raise my voice to even get her to come to the ER and I hate doing that but she won't listen. Last dialysis was 08/11/24.    Assessment & Plan:   Principal Problem:   Anasarca Active Problems:   COPD (chronic obstructive pulmonary disease) (HCC)   Essential hypertension   Schizoaffective disorder, bipolar type (HCC)   Diabetes  mellitus, type 2 (HCC)   Paranoid schizophrenia (HCC)   ESRD on dialysis (HCC)  # ESRD Intermittently refusing dialysis. Nephrology advises against sedating/restraining for this. Working to get mental illness under better control. Continues to refuse dialysis - nephrology following - palliative following - have added sodium bicarb  # Schizoaffective disorder Mental illness significant contributor to underlying medical problems - BH following, have re-started invega , are not planning additional med changes - BH now saying not appropriate for inpatient psych, cleared for discharge, have  # HTN Elevated today - continue home amlodipine , clonidine , losartan   # T2DM Glucose controlled - ssi  # COPD No exacerbation - home breztri   # CAD No chest pain - home asa, statin, losartan , imdur   # Gout - home allopurinol    DVT prophylaxis: heparin  Code Status: full Family Communication: sister Alicia Rogers updated telephonically 1/28, no answer when telephoned today, message left  Level of care: Med-Surg Status is: Inpatient Remains inpatient appropriate because: dispo planning underway, likely d/c home    Consultants:  Nephrology Behavioral health Palliative  Procedures: None thus far  Antimicrobials:  none    Subjective: Reports feeling fine, tolerating diet, BM today, refusing dialysis  Objective: Vitals:   08/30/24 1935 08/31/24 0327 08/31/24 0500 08/31/24 0723  BP: (!) 135/54 (!) 138/52  (!) 172/62  Pulse: (!) 57 (!) 54  67  Resp: 18 18  17  Temp: 97.9 F (36.6 C) 97.9 F (36.6 C)  98.7 F (37.1 C)  TempSrc: Oral Oral  Oral  SpO2: 100% 100%  100%  Weight:   57.7 kg     Intake/Output Summary (Last 24 hours) at 08/31/2024 1235 Last data filed at 08/31/2024 1011 Gross per 24 hour  Intake 480 ml  Output --  Net 480 ml   Filed Weights   08/30/24 0500 08/31/24 0500  Weight: 58 kg 57.7 kg    Examination:  General exam: Appears calm and comfortable,  chronically ill  Respiratory system: Clear to auscultation. Respiratory effort normal. Cardiovascular system: S1 & S2 heard, RRR. Gastrointestinal system: Abdomen is nondistended, soft and nontender.   Central nervous system: Alert, oriented to self Extremities: Symmetric 5 x 5 power. Skin: No rashes, lesions or ulcers Psychiatry: calm, guarded     Data Reviewed: I have personally reviewed following labs and imaging studies  CBC: Recent Labs  Lab 08/28/24 1314  WBC 6.5  NEUTROABS 4.4  HGB 12.0  HCT 40.5  MCV 92.5  PLT 382   Basic Metabolic Panel: Recent Labs  Lab 08/28/24 1314 08/29/24 1129 08/30/24 0524 08/31/24 0417  NA 145 142 145 140  K 3.3* 3.1* 3.5 3.3*  CL 112* 112* 114* 110  CO2 21* 19* 19* 19*  GLUCOSE 149* 174* 143* 159*  BUN 37* 41* 43* 46*  CREATININE 3.19* 3.35* 3.65* 3.84*  CALCIUM  9.2 8.2* 8.1* 8.5*  MG  --   --   --  1.8   GFR: Estimated Creatinine Clearance: 12.2 mL/min (A) (by C-G formula based on SCr of 3.84 mg/dL (H)). Liver Function Tests: Recent Labs  Lab 08/28/24 1314  AST 15  ALT 8  ALKPHOS 156*  BILITOT 0.3  PROT 6.8  ALBUMIN 3.1*   No results for input(s): LIPASE, AMYLASE in the last 168 hours. No results for input(s): AMMONIA in the last 168 hours. Coagulation Profile: No results for input(s): INR, PROTIME in the last 168 hours. Cardiac Enzymes: No results for input(s): CKTOTAL, CKMB, CKMBINDEX, TROPONINI in the last 168 hours. BNP (last 3 results) Recent Labs    07/30/24 0028 08/28/24 1314  PROBNP 8,261.0* 9,877.0*   HbA1C: Recent Labs    08/28/24 1314  HGBA1C 7.2*   CBG: Recent Labs  Lab 08/30/24 1137 08/30/24 1653 08/30/24 2112 08/31/24 0729 08/31/24 1115  GLUCAP 175* 104* 138* 127* 127*   Lipid Profile: No results for input(s): CHOL, HDL, LDLCALC, TRIG, CHOLHDL, LDLDIRECT in the last 72 hours. Thyroid  Function Tests: Recent Labs    08/28/24 1314  TSH 1.860   Anemia  Panel: No results for input(s): VITAMINB12, FOLATE, FERRITIN, TIBC, IRON , RETICCTPCT in the last 72 hours. Urine analysis:    Component Value Date/Time   COLORURINE YELLOW (A) 08/28/2024 1537   APPEARANCEUR HAZY (A) 08/28/2024 1537   APPEARANCEUR Cloudy (A) 03/11/2020 1629   LABSPEC 1.024 08/28/2024 1537   PHURINE 5.0 08/28/2024 1537   GLUCOSEU 150 (A) 08/28/2024 1537   HGBUR SMALL (A) 08/28/2024 1537   BILIRUBINUR NEGATIVE 08/28/2024 1537   BILIRUBINUR Negative 03/11/2020 1629   KETONESUR NEGATIVE 08/28/2024 1537   PROTEINUR >=300 (A) 08/28/2024 1537   NITRITE NEGATIVE 08/28/2024 1537   LEUKOCYTESUR NEGATIVE 08/28/2024 1537   Sepsis Labs: @LABRCNTIP (procalcitonin:4,lacticidven:4)  ) Recent Results (from the past 240 hours)  Blood culture (routine x 2)     Status: None (Preliminary result)   Collection Time: 08/28/24  3:37 PM   Specimen: BLOOD  Result Value Ref  Range Status   Specimen Description BLOOD RIGHT ANTECUBITAL  Final   Special Requests   Final    BOTTLES DRAWN AEROBIC AND ANAEROBIC Blood Culture results may not be optimal due to an inadequate volume of blood received in culture bottles   Culture   Final    NO GROWTH 3 DAYS Performed at [facility], [address] [place], [place] [address]    Report Status PENDING  Incomplete  Blood culture (routine x 2)     Status: None (Preliminary result)   Collection Time: 08/28/24  3:37 PM   Specimen: BLOOD  Result Value Ref Range Status   Specimen Description BLOOD BLOOD RIGHT FOREARM  Final   Special Requests   Final    BOTTLES DRAWN AEROBIC AND ANAEROBIC Blood Culture results may not be optimal due to an inadequate volume of blood received in culture bottles   Culture   Final    NO GROWTH 3 DAYS Performed at [facility], [address], [place], [place] [address]    Report Status PENDING  Incomplete         Radiology Studies: No results found.       Scheduled Meds:   allopurinol   100 mg Oral Daily   amLODipine   10 mg Oral Daily   aspirin  EC  81 mg Oral Daily   atorvastatin   40 mg Oral Daily   budesonide -glycopyrrolate -formoterol   2 puff Inhalation BID   cloNIDine   0.1 mg Oral TID   heparin   5,000 Units Subcutaneous Q8H   insulin  aspart  0-9 Units Subcutaneous TID WC   isosorbide  mononitrate  30 mg Oral QHS   losartan   50 mg Oral Daily   paliperidone   3 mg Oral Daily   pantoprazole   40 mg Oral Daily   sodium bicarbonate   650 mg Oral BID   Continuous Infusions:   LOS: 3 days     Devaughn KATHEE Ban, MD [facility]   If 7PM-7AM, please contact night-coverage [email or web address] Password [facility] 08/31/2024, 12:35 PM     "

## 2024-08-31 NOTE — TOC Progression Note (Deleted)
 Transition of Care [facility]) - Progression Note    Patient Details  Name: Alicia Rogers MRN: [id number] Date of Birth: [birth year]  Transition of Care [facility]) CM/SW Contact  Alicia JONETTA Hamilton, RN Phone Number: 08/31/2024, 3:03 PM  Clinical Narrative:     Spoke with patient's sister Alicia Rogers regarding patient's discharge order for today. Alicia Rogers advised that she and her father will be here after 4:30pm to take patient home. Alicia Rogers also requested that someone from [facility] contact her.   Contacted Alicia Rogers with [facility] and provided name and contact information for Alicia Rogers with request to reach out.                     Expected Discharge Plan and Services         Expected Discharge Date: 08/31/24                                     Social Drivers of Health (SDOH) Interventions SDOH Screenings   Food Insecurity: No Food Insecurity (08/26/2023)  Housing: Low Risk (08/26/2023)  Transportation Needs: No Transportation Needs (08/26/2023)  Utilities: Not At Risk (08/26/2023)  Alcohol Screen: Low Risk (09/29/2021)  Depression (PHQ2-9): Low Risk (01/12/2024)  Financial Resource Strain: Medium Risk (03/04/2023)   Received from [facility]  Social Connections: Unknown (09/28/2022)   Received from [facility]  Tobacco Use: High Risk (08/16/2024)   Received from [facility]    Readmission Risk Interventions    05/10/2023   10:05 AM  Readmission Risk Prevention Plan  Transportation Screening   HRI or Home Care Consult   Palliative Care Screening   Medication Review (RN Care Manager)      Information is confidential and restricted. Go to Review Flowsheets to unlock data.

## 2024-08-31 NOTE — Progress Notes (Signed)
 "                                                                                                                                                                                                          Daily Progress Note   Patient Name: Alicia Rogers       Date: 08/31/2024 DOB: 12-17-1960  Age: 64 y.o. MRN#: [id number] Attending Physician: Kandis Devaughn Sayres, MD Primary Care Physician: Austin Candyce Stall, MD Admit Date: 08/28/2024  Reason for Consultation/Follow-up: Establishing goals of care  HPI/Brief Hospital Review: 64 y.o. female  with past medical history of type 2 diabetes, CKD, MGUS, schizoaffective disorder, depression, HTN, GERD, ESRD, secondary hyperparathyroidism, and bipolar admitted on 08/28/2024 with chief complaint of sisters concerns for patient missing dialysis.   As per chart review, patient has not been to dialysis since 08/11/2024.  Upon presentation to the ED, patient was conversational but her speech was slow and slurred.  She was able to confirm she is at the hospital without any breathing problems.    As per chart review, patient is shared with the previous attending reasons for not going to dialysis include they are talking bad about me there and just not wanting to go.   Patient's sister [name] shares she is working with DSS to arrange guardianship and is concerned with patient's increased shortness of breath over the past few days thus resulting in admission to ED.   Patient is currently being treated for anasarca, ESRD, schizoaffective disorder.  Psychiatry and nephrology were consulted.   As per psychiatry's evaluation and recommendations, patient is involuntarily committed and was recommended for inpatient psychiatric admission following medical stabilization.   PMT was consulted by Dr. Kandis on 1/28 for goals of care-significant psych problems and intermittently refusing dialysis.    Following up today for continued discussion with patient regarding boundaries  and goals of medical treatments.   Subjective: Chart reviewed: Labs:Reviewed from today Vital signs:Reviewed from today Progress Notes:Primary team, nephrology, behavioral health providers, PMT and nursing notes reviewed since admission  Visited with strokes at her bedside.  She is awake, alert, intermittently engages in a conversation.  Sitter at bedside reports that she has been up a few times this morning to restroom, was not interested in breakfast only consumed a few bites.  Ms. Khatoon reports feeling no different today, she denies pain or discomfort.  She reports not having an appetite.  Inquired about Ms. Gurry's understanding of hospitalization.  She shares she is here to be convinced to have dialysis.  Ms. Burkard shares that  she is not interested in dialysis.  She says she is not accepting of dialysis in the hospital.  She shares moving forward she would consider dialysis sessions only in her home as she does not want to travel to the dialysis center any further.  She shares she has an understanding there is a risk of mortality with not pursuing further dialysis sessions.  That Ms. Sult remains IVC at this time, no adjustment to plan or goals of care.  From chart review sister attempting to obtain guardianship.  PMT will continue to follow for ongoing needs and support.  From chart review plan for geropsych placement once bed available.  Objective:  Physical Exam Constitutional:      General: She is not in acute distress.    Appearance: She is ill-appearing.  Pulmonary:     Effort: Pulmonary effort is normal. No respiratory distress.  Musculoskeletal:     Right lower leg: 2+ Edema present.     Left lower leg: 2+ Edema present.  Neurological:     Mental Status: She is alert.  Psychiatric:        Mood and Affect: Affect is flat.             Vital Signs: BP (!) 172/62 (BP Location: Left Wrist)   Pulse 67   Temp 98.7 F (37.1 C) (Oral)   Resp 17   Wt 57.7 kg   SpO2  100%   BMI 22.53 kg/m  SpO2: SpO2: 100 % O2 Device: O2 Device: Room Air O2 Flow Rate:     Palliative Care Assessment & Plan   Assessment/Recommendation/Plan  Continue with current plan of care  Thank you for allowing the Palliative Medicine Team to assist in the care of this patient.  I personally spent a total of 35 minutes in the care of the patient today including preparing to see the patient, getting/reviewing separately obtained history, performing a medically appropriate exam/evaluation, counseling and educating, and documenting clinical information in the EHR.   Waddell Lesches, DNP, AGNP-C Palliative Medicine   Please contact Palliative Medicine Team phone at [phone] for questions and concerns.   "

## 2024-08-31 NOTE — TOC Transition Note (Signed)
 Transition of Care [facility]) - Discharge Note   Patient Details  Name: Alicia Rogers MRN: [id number] Date of Birth: 16-Dec-1960  Transition of Care [facility]) CM/SW Contact:  Daved JONETTA Hamilton, RN Phone Number: 08/31/2024, 9:21 PM   Clinical Narrative:     Received communication from Care Team patient is not appropriate for inpatient [facility] unit, no placement offers received from referrals sent by [facility], Attending advised will pursue discharge to home.  Spoke with patient's sister [name] regarding patient's discharge order for today. [name] advised that she and her father will be here after 4:30pm to take patient home. [name] also requested that someone from [facility] contact her.    Contacted [name] with [facility] and provided name and contact information for [name] with request to reach out.   Final next level of care: Other (comment) (Home with sister [name]) Barriers to Discharge: Barriers Resolved   Patient Goals and CMS Choice            Discharge Placement                  Name of family member notified: [name] Patient and family notified of of transfer: 08/31/24  Discharge Plan and Services Additional resources added to the After Visit Summary for                                       Social Drivers of Health (SDOH) Interventions SDOH Screenings   Food Insecurity: No Food Insecurity (08/26/2023)  Housing: Low Risk (08/26/2023)  Transportation Needs: No Transportation Needs (08/26/2023)  Utilities: Not At Risk (08/26/2023)  Alcohol Screen: Low Risk (09/29/2021)  Depression (PHQ2-9): Low Risk (01/12/2024)  Financial Resource Strain: Medium Risk (03/04/2023)   Received [facility]  Social Connections: Unknown (09/28/2022)   Received from [facility]  Tobacco Use: High Risk (08/16/2024)   Received from [facility]     Readmission Risk Interventions    05/10/2023   10:05 AM  Readmission Risk Prevention Plan   Transportation Screening   HRI or Home Care Consult   Palliative Care Screening   Medication Review (RN Care Manager)      Information is confidential and restricted. Go to Review Flowsheets to unlock data.

## 2024-08-31 NOTE — Care Management Important Message (Signed)
 Important Message  Patient Details  Name: Alicia Rogers MRN: [id number] Date of Birth: Nov 01, 1960   Important Message Given:  Yes - Medicare IM     Jmari Pelc 08/31/2024, 1:59 PM

## 2024-08-31 NOTE — Plan of Care (Signed)
" °  Problem: Fluid Volume: Goal: Ability to maintain a balanced intake and output will improve Outcome: Progressing   Problem: Nutritional: Goal: Maintenance of adequate nutrition will improve Outcome: Progressing   Problem: Skin Integrity: Goal: Risk for impaired skin integrity will decrease Outcome: Progressing   Problem: Coping: Goal: Level of anxiety will decrease Outcome: Progressing   "

## 2024-09-02 LAB — CULTURE, BLOOD (ROUTINE X 2)
Culture: NO GROWTH
Culture: NO GROWTH

## 2024-11-19 ENCOUNTER — Ambulatory Visit: Admitting: Urology

## 2024-12-21 ENCOUNTER — Encounter (INDEPENDENT_AMBULATORY_CARE_PROVIDER_SITE_OTHER)

## 2024-12-21 ENCOUNTER — Ambulatory Visit (INDEPENDENT_AMBULATORY_CARE_PROVIDER_SITE_OTHER): Admitting: Nurse Practitioner
